# Patient Record
Sex: Female | Born: 1985 | Race: White | Hispanic: No | Smoking: Never smoker
Health system: Southern US, Community
[De-identification: ages and names within clinical notes are randomized; demographics above are authoritative.]

## PROBLEM LIST (undated history)

## (undated) DIAGNOSIS — N12 Tubulo-interstitial nephritis, not specified as acute or chronic: Secondary | ICD-10-CM

## (undated) DIAGNOSIS — K2970 Gastritis, unspecified, without bleeding: Secondary | ICD-10-CM

## (undated) DIAGNOSIS — N135 Crossing vessel and stricture of ureter without hydronephrosis: Secondary | ICD-10-CM

## (undated) DIAGNOSIS — B9681 Helicobacter pylori [H. pylori] as the cause of diseases classified elsewhere: Secondary | ICD-10-CM

## (undated) DIAGNOSIS — J811 Chronic pulmonary edema: Secondary | ICD-10-CM

## (undated) DIAGNOSIS — K219 Gastro-esophageal reflux disease without esophagitis: Secondary | ICD-10-CM

## (undated) DIAGNOSIS — N289 Disorder of kidney and ureter, unspecified: Secondary | ICD-10-CM

## (undated) DIAGNOSIS — K3184 Gastroparesis: Secondary | ICD-10-CM

## (undated) DIAGNOSIS — G43909 Migraine, unspecified, not intractable, without status migrainosus: Secondary | ICD-10-CM

## (undated) DIAGNOSIS — I10 Essential (primary) hypertension: Secondary | ICD-10-CM

## (undated) DIAGNOSIS — K760 Fatty (change of) liver, not elsewhere classified: Secondary | ICD-10-CM

## (undated) DIAGNOSIS — Z6841 Body Mass Index (BMI) 40.0 and over, adult: Secondary | ICD-10-CM

## (undated) DIAGNOSIS — A048 Other specified bacterial intestinal infections: Secondary | ICD-10-CM

## (undated) DIAGNOSIS — A4151 Sepsis due to Escherichia coli [E. coli]: Secondary | ICD-10-CM

## (undated) HISTORY — DX: Migraine, unspecified, not intractable, without status migrainosus: G43.909

## (undated) HISTORY — DX: Tubulo-interstitial nephritis, not specified as acute or chronic: N12

## (undated) HISTORY — DX: Sepsis due to Escherichia coli (e. coli): A41.51

## (undated) HISTORY — DX: Crossing vessel and stricture of ureter without hydronephrosis: N13.5

## (undated) HISTORY — DX: Helicobacter pylori (H. pylori) as the cause of diseases classified elsewhere: B96.81

## (undated) HISTORY — DX: Gastroparesis: K31.84

## (undated) HISTORY — DX: Fatty (change of) liver, not elsewhere classified: K76.0

## (undated) HISTORY — DX: Gastritis, unspecified, without bleeding: K29.70

## (undated) HISTORY — DX: Chronic pulmonary edema: J81.1

## (undated) HISTORY — DX: Gastro-esophageal reflux disease without esophagitis: K21.9

---
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
## 2007-05-13 ENCOUNTER — Encounter: Payer: Self-pay | Admitting: *Deleted

## 2007-06-25 ENCOUNTER — Ambulatory Visit: Payer: Self-pay | Admitting: Family Medicine

## 2007-06-27 ENCOUNTER — Encounter (INDEPENDENT_AMBULATORY_CARE_PROVIDER_SITE_OTHER): Payer: Self-pay | Admitting: Family Medicine

## 2007-07-04 ENCOUNTER — Telehealth (INDEPENDENT_AMBULATORY_CARE_PROVIDER_SITE_OTHER): Payer: Self-pay | Admitting: Family Medicine

## 2007-08-14 ENCOUNTER — Encounter (INDEPENDENT_AMBULATORY_CARE_PROVIDER_SITE_OTHER): Payer: Self-pay | Admitting: *Deleted

## 2007-08-14 ENCOUNTER — Emergency Department (HOSPITAL_COMMUNITY): Admission: EM | Admit: 2007-08-14 | Discharge: 2007-08-14 | Payer: Self-pay | Admitting: Emergency Medicine

## 2007-11-24 ENCOUNTER — Emergency Department (HOSPITAL_COMMUNITY): Admission: EM | Admit: 2007-11-24 | Discharge: 2007-11-25 | Payer: Self-pay | Admitting: Emergency Medicine

## 2007-12-08 ENCOUNTER — Ambulatory Visit: Payer: Self-pay | Admitting: Family Medicine

## 2007-12-08 ENCOUNTER — Encounter (INDEPENDENT_AMBULATORY_CARE_PROVIDER_SITE_OTHER): Payer: Self-pay | Admitting: Family Medicine

## 2007-12-08 DIAGNOSIS — R197 Diarrhea, unspecified: Secondary | ICD-10-CM | POA: Insufficient documentation

## 2007-12-08 LAB — CONVERTED CEMR LAB
AST: 22 units/L (ref 0–37)
Albumin: 4.4 g/dL (ref 3.5–5.2)
CO2: 23 meq/L (ref 19–32)
Calcium: 9.2 mg/dL (ref 8.4–10.5)
Cholesterol: 192 mg/dL (ref 0–200)
Eosinophils Absolute: 0.2 10*3/uL (ref 0.0–0.7)
Eosinophils Relative: 3 % (ref 0–5)
Glucose, Bld: 313 mg/dL — ABNORMAL HIGH (ref 70–99)
HDL: 36 mg/dL — ABNORMAL LOW (ref >39)
Hgb A1c MFr Bld: 11.2 %
MCHC: 33.3 g/dL (ref 30.0–36.0)
Neutro Abs: 5.4 10*3/uL (ref 1.7–7.7)
Total Bilirubin: 0.6 mg/dL (ref 0.3–1.2)
Total CHOL/HDL Ratio: 5.3
Triglycerides: 355 mg/dL — ABNORMAL HIGH (ref <150)
VLDL: 71 mg/dL — ABNORMAL HIGH (ref 0–40)

## 2007-12-09 ENCOUNTER — Telehealth (INDEPENDENT_AMBULATORY_CARE_PROVIDER_SITE_OTHER): Payer: Self-pay | Admitting: Family Medicine

## 2007-12-09 ENCOUNTER — Encounter (INDEPENDENT_AMBULATORY_CARE_PROVIDER_SITE_OTHER): Payer: Self-pay | Admitting: Family Medicine

## 2007-12-09 LAB — CONVERTED CEMR LAB
Free T4: 1.38 ng/dL (ref 0.89–1.80)
T3, Free: 3.3 pg/mL (ref 2.3–4.2)

## 2007-12-10 ENCOUNTER — Encounter (INDEPENDENT_AMBULATORY_CARE_PROVIDER_SITE_OTHER): Payer: Self-pay | Admitting: Family Medicine

## 2007-12-10 DIAGNOSIS — E079 Disorder of thyroid, unspecified: Secondary | ICD-10-CM | POA: Insufficient documentation

## 2007-12-19 ENCOUNTER — Encounter: Payer: Self-pay | Admitting: *Deleted

## 2007-12-30 ENCOUNTER — Encounter (INDEPENDENT_AMBULATORY_CARE_PROVIDER_SITE_OTHER): Payer: Self-pay | Admitting: Family Medicine

## 2008-02-08 ENCOUNTER — Emergency Department (HOSPITAL_COMMUNITY): Admission: EM | Admit: 2008-02-08 | Discharge: 2008-02-08 | Payer: Self-pay | Admitting: Family Medicine

## 2008-03-24 ENCOUNTER — Telehealth (INDEPENDENT_AMBULATORY_CARE_PROVIDER_SITE_OTHER): Payer: Self-pay | Admitting: Family Medicine

## 2008-03-29 ENCOUNTER — Encounter (INDEPENDENT_AMBULATORY_CARE_PROVIDER_SITE_OTHER): Payer: Self-pay | Admitting: Internal Medicine

## 2008-03-29 ENCOUNTER — Emergency Department (HOSPITAL_COMMUNITY): Admission: EM | Admit: 2008-03-29 | Discharge: 2008-03-29 | Payer: Self-pay | Admitting: Family Medicine

## 2008-04-07 ENCOUNTER — Ambulatory Visit: Payer: Self-pay | Admitting: Family Medicine

## 2008-04-08 ENCOUNTER — Telehealth (INDEPENDENT_AMBULATORY_CARE_PROVIDER_SITE_OTHER): Payer: Self-pay | Admitting: Family Medicine

## 2008-04-28 ENCOUNTER — Telehealth: Payer: Self-pay | Admitting: Gastroenterology

## 2008-05-07 HISTORY — PX: ESOPHAGOGASTRODUODENOSCOPY: SHX1529

## 2008-05-10 ENCOUNTER — Ambulatory Visit: Payer: Self-pay | Admitting: Family Medicine

## 2008-05-12 ENCOUNTER — Telehealth: Payer: Self-pay | Admitting: *Deleted

## 2008-06-08 ENCOUNTER — Telehealth (INDEPENDENT_AMBULATORY_CARE_PROVIDER_SITE_OTHER): Payer: Self-pay | Admitting: Family Medicine

## 2008-06-15 ENCOUNTER — Encounter: Payer: Self-pay | Admitting: *Deleted

## 2008-06-15 ENCOUNTER — Telehealth (INDEPENDENT_AMBULATORY_CARE_PROVIDER_SITE_OTHER): Payer: Self-pay | Admitting: Family Medicine

## 2008-06-25 ENCOUNTER — Ambulatory Visit: Payer: Self-pay | Admitting: Family Medicine

## 2008-06-25 ENCOUNTER — Encounter (INDEPENDENT_AMBULATORY_CARE_PROVIDER_SITE_OTHER): Payer: Self-pay | Admitting: Family Medicine

## 2008-06-25 ENCOUNTER — Encounter: Admission: RE | Admit: 2008-06-25 | Discharge: 2008-06-25 | Payer: Self-pay | Admitting: Sports Medicine

## 2008-06-27 LAB — CONVERTED CEMR LAB
ALT: 17 units/L (ref 0–35)
AST: 16 units/L (ref 0–37)
Alkaline Phosphatase: 61 units/L (ref 39–117)
Chloride: 97 meq/L (ref 96–112)
Creatinine, Ser: 0.73 mg/dL (ref 0.40–1.20)
Potassium: 4.7 meq/L (ref 3.5–5.3)
Sodium: 134 meq/L — ABNORMAL LOW (ref 135–145)
TSH: 2.432 microintl units/mL (ref 0.350–4.50)
Total Bilirubin: 0.3 mg/dL (ref 0.3–1.2)
Total Protein: 7.5 g/dL (ref 6.0–8.3)

## 2008-07-01 ENCOUNTER — Encounter: Payer: Self-pay | Admitting: *Deleted

## 2008-07-01 ENCOUNTER — Telehealth: Payer: Self-pay | Admitting: *Deleted

## 2008-07-23 ENCOUNTER — Ambulatory Visit: Payer: Self-pay | Admitting: Family Medicine

## 2008-07-26 ENCOUNTER — Ambulatory Visit: Payer: Self-pay | Admitting: Gastroenterology

## 2008-07-27 ENCOUNTER — Ambulatory Visit: Payer: Self-pay | Admitting: Gastroenterology

## 2008-08-12 ENCOUNTER — Ambulatory Visit: Payer: Self-pay | Admitting: Gastroenterology

## 2008-08-16 ENCOUNTER — Emergency Department (HOSPITAL_COMMUNITY): Admission: EM | Admit: 2008-08-16 | Discharge: 2008-08-16 | Payer: Self-pay | Admitting: Emergency Medicine

## 2008-08-24 ENCOUNTER — Other Ambulatory Visit: Admission: RE | Admit: 2008-08-24 | Discharge: 2008-08-24 | Payer: Self-pay | Admitting: Family Medicine

## 2008-08-24 ENCOUNTER — Encounter (INDEPENDENT_AMBULATORY_CARE_PROVIDER_SITE_OTHER): Payer: Self-pay | Admitting: Family Medicine

## 2008-08-24 ENCOUNTER — Ambulatory Visit: Payer: Self-pay | Admitting: Family Medicine

## 2008-08-24 DIAGNOSIS — N926 Irregular menstruation, unspecified: Secondary | ICD-10-CM | POA: Insufficient documentation

## 2008-08-24 LAB — CONVERTED CEMR LAB: GC Probe Amp, Genital: NEGATIVE

## 2008-08-26 ENCOUNTER — Ambulatory Visit: Payer: Self-pay | Admitting: Family Medicine

## 2008-08-30 ENCOUNTER — Ambulatory Visit (HOSPITAL_COMMUNITY): Admission: RE | Admit: 2008-08-30 | Discharge: 2008-08-30 | Payer: Self-pay | Admitting: Gastroenterology

## 2008-09-09 ENCOUNTER — Ambulatory Visit: Payer: Self-pay | Admitting: Family Medicine

## 2008-09-21 ENCOUNTER — Telehealth: Payer: Self-pay | Admitting: *Deleted

## 2008-09-24 ENCOUNTER — Encounter (INDEPENDENT_AMBULATORY_CARE_PROVIDER_SITE_OTHER): Payer: Self-pay | Admitting: Family Medicine

## 2008-09-24 ENCOUNTER — Ambulatory Visit: Payer: Self-pay | Admitting: Family Medicine

## 2008-09-24 LAB — CONVERTED CEMR LAB
FSH: 5.7 milliintl units/mL
LH: 5.2 milliintl units/mL
Platelets: 286 10*3/uL (ref 150–400)
RBC: 4.97 M/uL (ref 3.87–5.11)
RDW: 13.4 % (ref 11.5–15.5)

## 2008-09-27 ENCOUNTER — Encounter (INDEPENDENT_AMBULATORY_CARE_PROVIDER_SITE_OTHER): Payer: Self-pay | Admitting: Family Medicine

## 2008-09-27 ENCOUNTER — Ambulatory Visit: Payer: Self-pay | Admitting: Gastroenterology

## 2008-09-27 DIAGNOSIS — K3184 Gastroparesis: Secondary | ICD-10-CM | POA: Insufficient documentation

## 2008-09-27 LAB — CONVERTED CEMR LAB: TSH: 4.828 microintl units/mL — ABNORMAL HIGH (ref 0.350–4.500)

## 2008-09-28 ENCOUNTER — Encounter (INDEPENDENT_AMBULATORY_CARE_PROVIDER_SITE_OTHER): Payer: Self-pay | Admitting: Family Medicine

## 2008-09-28 ENCOUNTER — Encounter (INDEPENDENT_AMBULATORY_CARE_PROVIDER_SITE_OTHER): Payer: Self-pay | Admitting: *Deleted

## 2008-09-28 LAB — CONVERTED CEMR LAB: Free T4: 1.22 ng/dL (ref 0.80–1.80)

## 2008-10-11 ENCOUNTER — Ambulatory Visit: Payer: Self-pay | Admitting: Internal Medicine

## 2008-10-28 ENCOUNTER — Ambulatory Visit: Payer: Self-pay | Admitting: Gastroenterology

## 2008-11-01 ENCOUNTER — Ambulatory Visit: Payer: Self-pay | Admitting: Family Medicine

## 2008-11-01 DIAGNOSIS — I10 Essential (primary) hypertension: Secondary | ICD-10-CM | POA: Insufficient documentation

## 2008-11-01 HISTORY — DX: Essential (primary) hypertension: I10

## 2008-11-01 LAB — CONVERTED CEMR LAB: Hgb A1c MFr Bld: 11.6 %

## 2008-11-04 ENCOUNTER — Encounter (INDEPENDENT_AMBULATORY_CARE_PROVIDER_SITE_OTHER): Payer: Self-pay | Admitting: *Deleted

## 2008-11-26 ENCOUNTER — Encounter: Payer: Self-pay | Admitting: Family Medicine

## 2008-12-17 ENCOUNTER — Ambulatory Visit: Payer: Self-pay | Admitting: Gastroenterology

## 2009-08-24 ENCOUNTER — Telehealth (INDEPENDENT_AMBULATORY_CARE_PROVIDER_SITE_OTHER): Payer: Self-pay | Admitting: *Deleted

## 2009-10-12 ENCOUNTER — Telehealth (INDEPENDENT_AMBULATORY_CARE_PROVIDER_SITE_OTHER): Payer: Self-pay | Admitting: *Deleted

## 2010-03-06 ENCOUNTER — Emergency Department (HOSPITAL_COMMUNITY): Admission: EM | Admit: 2010-03-06 | Discharge: 2010-03-06 | Payer: Self-pay | Admitting: Family Medicine

## 2010-06-06 NOTE — Progress Notes (Signed)
Summary: tb results   Phone Note Call from Patient Call back at Home Phone [phone]   Caller: Patient Summary of Call: pt needs a copy of tb test from last year Initial call taken by: Audie Clear,  October 12, 2009 3:17 PM  Follow-up for Phone Call        patient notified that record is ready for pick up. Follow-up by: Marcell Barlow RN,  October 12, 2009 3:28 PM

## 2010-06-06 NOTE — Progress Notes (Signed)
Summary: shot records   Phone Note Call from Patient Call back at Home Phone [phone]   Caller: Patient Summary of Call: needs a copy of shot records Initial call taken by: Audie Clear,  August 24, 2009 11:04 AM  Follow-up for Phone Call        called and left message to return call. We do not have a complete list of immunizations.  will place shot record that we have in file in front office. Follow-up by: Marcell Barlow RN,  August 24, 2009 11:21 AM  Additional Follow-up for Phone Call Additional follow up Details #1::        spoke with patient and she states she has been coming to this office since she was a baby. will request old chart from storage and this will take about a week. will call patient when we receive.     Appended Document: shot records patient notified that record is ready for pick up  and will be a $10.00 fee.

## 2010-07-19 LAB — POCT URINALYSIS DIPSTICK
Glucose, UA: NEGATIVE mg/dL
Nitrite: NEGATIVE
pH: 7 (ref 5.0–8.0)

## 2010-08-16 LAB — URINALYSIS, ROUTINE W REFLEX MICROSCOPIC
Bilirubin Urine: NEGATIVE
Protein, ur: 100 mg/dL — AB
pH: 5.5 (ref 5.0–8.0)

## 2010-08-16 LAB — URINE MICROSCOPIC-ADD ON

## 2010-08-16 LAB — POCT PREGNANCY, URINE: Preg Test, Ur: NEGATIVE

## 2010-08-17 LAB — GLUCOSE, CAPILLARY
Glucose-Capillary: 199 mg/dL — ABNORMAL HIGH (ref 70–99)
Glucose-Capillary: 242 mg/dL — ABNORMAL HIGH (ref 70–99)

## 2010-08-21 LAB — GLUCOSE, CAPILLARY: Glucose-Capillary: 323 mg/dL — ABNORMAL HIGH (ref 70–99)

## 2010-08-25 ENCOUNTER — Inpatient Hospital Stay (INDEPENDENT_AMBULATORY_CARE_PROVIDER_SITE_OTHER)
Admission: RE | Admit: 2010-08-25 | Discharge: 2010-08-25 | Disposition: A | Discharge status: Home / Self Care | Payer: 59 | Source: Ambulatory Visit | Attending: Family Medicine | Admitting: Family Medicine

## 2010-08-25 ENCOUNTER — Emergency Department (HOSPITAL_COMMUNITY): Payer: 59

## 2010-08-25 ENCOUNTER — Emergency Department (HOSPITAL_COMMUNITY)
Admission: EM | Admit: 2010-08-25 | Discharge: 2010-08-25 | Disposition: A | Discharge status: Home / Self Care | Payer: 59 | Attending: Emergency Medicine | Admitting: Emergency Medicine

## 2010-08-25 DIAGNOSIS — R7989 Other specified abnormal findings of blood chemistry: Secondary | ICD-10-CM

## 2010-08-25 DIAGNOSIS — N12 Tubulo-interstitial nephritis, not specified as acute or chronic: Secondary | ICD-10-CM

## 2010-08-25 DIAGNOSIS — R000 Tachycardia, unspecified: Secondary | ICD-10-CM | POA: Insufficient documentation

## 2010-08-25 DIAGNOSIS — O2390 Unspecified genitourinary tract infection in pregnancy, unspecified trimester: Secondary | ICD-10-CM | POA: Insufficient documentation

## 2010-08-25 DIAGNOSIS — Z331 Pregnant state, incidental: Secondary | ICD-10-CM

## 2010-08-25 DIAGNOSIS — E119 Type 2 diabetes mellitus without complications: Secondary | ICD-10-CM | POA: Insufficient documentation

## 2010-08-25 DIAGNOSIS — R109 Unspecified abdominal pain: Secondary | ICD-10-CM | POA: Insufficient documentation

## 2010-08-25 DIAGNOSIS — R110 Nausea: Secondary | ICD-10-CM | POA: Insufficient documentation

## 2010-08-25 DIAGNOSIS — R300 Dysuria: Secondary | ICD-10-CM | POA: Insufficient documentation

## 2010-08-25 DIAGNOSIS — R509 Fever, unspecified: Secondary | ICD-10-CM | POA: Insufficient documentation

## 2010-08-25 DIAGNOSIS — R350 Frequency of micturition: Secondary | ICD-10-CM | POA: Insufficient documentation

## 2010-08-25 LAB — POCT I-STAT, CHEM 8
BUN: 6 mg/dL (ref 6–23)
Calcium, Ion: 1.09 mmol/L — ABNORMAL LOW (ref 1.12–1.32)
Creatinine, Ser: 0.8 mg/dL (ref 0.4–1.2)
Glucose, Bld: 354 mg/dL — ABNORMAL HIGH (ref 70–99)
Sodium: 131 mEq/L — ABNORMAL LOW (ref 135–145)

## 2010-08-25 LAB — CBC
Hemoglobin: 14.5 g/dL (ref 12.0–15.0)
MCH: 29.7 pg (ref 26.0–34.0)
MCHC: 35.4 g/dL (ref 30.0–36.0)

## 2010-08-25 LAB — POCT URINALYSIS DIP (DEVICE)
Bilirubin Urine: NEGATIVE
Glucose, UA: 500 mg/dL — AB
Protein, ur: 100 mg/dL — AB

## 2010-08-25 LAB — DIFFERENTIAL
Basophils Relative: 0 % (ref 0–1)
Monocytes Absolute: 1.7 10*3/uL — ABNORMAL HIGH (ref 0.1–1.0)
Monocytes Relative: 9 % (ref 3–12)
Neutro Abs: 15.7 10*3/uL — ABNORMAL HIGH (ref 1.7–7.7)

## 2010-08-25 LAB — POCT PREGNANCY, URINE: Preg Test, Ur: POSITIVE

## 2010-08-25 LAB — HCG, QUANTITATIVE, PREGNANCY: hCG, Beta Chain, Quant, S: 151 m[IU]/mL — ABNORMAL HIGH (ref <5)

## 2010-08-28 ENCOUNTER — Inpatient Hospital Stay (HOSPITAL_COMMUNITY)
Admission: AD | Admit: 2010-08-28 | Discharge: 2010-08-28 | Disposition: A | Discharge status: Home / Self Care | Payer: 59 | Source: Ambulatory Visit | Attending: Family Medicine | Admitting: Family Medicine

## 2010-08-28 DIAGNOSIS — O200 Threatened abortion: Secondary | ICD-10-CM | POA: Insufficient documentation

## 2010-08-30 ENCOUNTER — Other Ambulatory Visit: Payer: Self-pay | Admitting: Obstetrics and Gynecology

## 2010-08-30 DIAGNOSIS — O99810 Abnormal glucose complicating pregnancy: Secondary | ICD-10-CM

## 2010-08-30 DIAGNOSIS — Z331 Pregnant state, incidental: Secondary | ICD-10-CM

## 2010-08-30 LAB — POCT URINALYSIS DIP (DEVICE)
Bilirubin Urine: NEGATIVE
Ketones, ur: NEGATIVE mg/dL
Specific Gravity, Urine: 1.025 (ref 1.005–1.030)
pH: 5.5 (ref 5.0–8.0)

## 2010-09-05 ENCOUNTER — Inpatient Hospital Stay (HOSPITAL_COMMUNITY)
Admission: AD | Admit: 2010-09-05 | Discharge: 2010-09-05 | Disposition: A | Discharge status: Home / Self Care | Payer: 59 | Source: Ambulatory Visit | Attending: Family Medicine | Admitting: Family Medicine

## 2010-09-05 DIAGNOSIS — O200 Threatened abortion: Secondary | ICD-10-CM | POA: Insufficient documentation

## 2010-09-05 LAB — CBC
HCT: 36.6 % (ref 36.0–46.0)
Hemoglobin: 12.7 g/dL (ref 12.0–15.0)
MCH: 29.3 pg (ref 26.0–34.0)
MCHC: 34.7 g/dL (ref 30.0–36.0)

## 2010-09-05 LAB — DIFFERENTIAL
Lymphocytes Relative: 32 % (ref 12–46)
Lymphs Abs: 3.6 10*3/uL (ref 0.7–4.0)
Monocytes Absolute: 0.9 10*3/uL (ref 0.1–1.0)
Monocytes Relative: 8 % (ref 3–12)
Neutro Abs: 6.5 10*3/uL (ref 1.7–7.7)

## 2010-09-05 LAB — CREATININE, SERUM: GFR calc non Af Amer: >60 mL/min (ref >60)

## 2010-09-05 LAB — HCG, QUANTITATIVE, PREGNANCY: hCG, Beta Chain, Quant, S: 261 m[IU]/mL — ABNORMAL HIGH (ref <5)

## 2010-09-06 ENCOUNTER — Inpatient Hospital Stay (HOSPITAL_COMMUNITY)
Admission: AD | Admit: 2010-09-06 | Discharge: 2010-09-06 | Disposition: A | Discharge status: Home / Self Care | Payer: 59 | Source: Ambulatory Visit | Attending: Obstetrics and Gynecology | Admitting: Obstetrics and Gynecology

## 2010-09-06 DIAGNOSIS — O00109 Unspecified tubal pregnancy without intrauterine pregnancy: Secondary | ICD-10-CM | POA: Insufficient documentation

## 2010-09-09 ENCOUNTER — Inpatient Hospital Stay (HOSPITAL_COMMUNITY)
Admission: AD | Admit: 2010-09-09 | Discharge: 2010-09-09 | Disposition: A | Discharge status: Home / Self Care | Payer: 59 | Source: Ambulatory Visit | Attending: Obstetrics & Gynecology | Admitting: Obstetrics & Gynecology

## 2010-09-09 DIAGNOSIS — O00109 Unspecified tubal pregnancy without intrauterine pregnancy: Secondary | ICD-10-CM

## 2010-09-12 ENCOUNTER — Inpatient Hospital Stay (HOSPITAL_COMMUNITY)
Admission: AD | Admit: 2010-09-12 | Discharge: 2010-09-12 | Disposition: A | Discharge status: Home / Self Care | Payer: 59 | Source: Ambulatory Visit | Attending: Obstetrics & Gynecology | Admitting: Obstetrics & Gynecology

## 2010-09-12 DIAGNOSIS — O00109 Unspecified tubal pregnancy without intrauterine pregnancy: Secondary | ICD-10-CM

## 2010-09-12 LAB — HCG, QUANTITATIVE, PREGNANCY: hCG, Beta Chain, Quant, S: 215 m[IU]/mL — ABNORMAL HIGH (ref <5)

## 2010-09-19 ENCOUNTER — Other Ambulatory Visit: Payer: 59

## 2010-09-19 DIAGNOSIS — Z0189 Encounter for other specified special examinations: Secondary | ICD-10-CM

## 2010-09-20 ENCOUNTER — Other Ambulatory Visit: Payer: 59

## 2010-09-26 ENCOUNTER — Other Ambulatory Visit: Payer: 59

## 2010-09-26 DIAGNOSIS — Z0189 Encounter for other specified special examinations: Secondary | ICD-10-CM

## 2010-09-29 ENCOUNTER — Other Ambulatory Visit: Payer: 59

## 2010-09-29 DIAGNOSIS — Z0189 Encounter for other specified special examinations: Secondary | ICD-10-CM

## 2010-10-06 ENCOUNTER — Other Ambulatory Visit: Payer: 59

## 2010-10-06 DIAGNOSIS — Z0189 Encounter for other specified special examinations: Secondary | ICD-10-CM

## 2010-10-11 ENCOUNTER — Other Ambulatory Visit: Payer: 59

## 2010-10-12 ENCOUNTER — Other Ambulatory Visit: Payer: 59

## 2010-10-12 DIAGNOSIS — Z0189 Encounter for other specified special examinations: Secondary | ICD-10-CM

## 2010-11-17 ENCOUNTER — Encounter: Payer: Self-pay | Admitting: Obstetrics & Gynecology

## 2010-11-17 ENCOUNTER — Ambulatory Visit (INDEPENDENT_AMBULATORY_CARE_PROVIDER_SITE_OTHER): Payer: 59 | Admitting: Obstetrics & Gynecology

## 2010-11-17 ENCOUNTER — Ambulatory Visit: Payer: 59 | Admitting: Obstetrics & Gynecology

## 2010-11-17 VITALS — BP 121/86 | HR 97 | Temp 97.2°F | Ht 62.0 in | Wt 271.6 lb

## 2010-11-17 DIAGNOSIS — Z8742 Personal history of other diseases of the female genital tract: Secondary | ICD-10-CM

## 2010-11-17 DIAGNOSIS — Z8759 Personal history of other complications of pregnancy, childbirth and the puerperium: Secondary | ICD-10-CM

## 2010-11-17 MED ORDER — NORGESTIM-ETH ESTRAD TRIPHASIC 0.18/0.215/0.25 MG-25 MCG PO TABS: 1.0000 | ORAL_TABLET | ORAL | Status: DC

## 2010-11-17 NOTE — Patient Instructions (Signed)
Oral Contraceptives (Birth Control Pills) Oral contraceptives (OCs) are medicines taken to prevent pregnancy. They are the most widely used method of birth control. OCs work by preventing the ovaries from releasing eggs. The OC hormones also cause the mucus on the cervix to thicken, preventing the sperm from entering the uterus. They also cause the lining of the uterus to become thin, not allowing a fertilized egg to attach to the inside of the uterus. OCs have a failure rate of less than 1%, when taken exactly as prescribed. THERE ARE 2 TYPES OF OC  OC that contains a mix of estrogen and progesterone hormones is the most common OC used. It is taken for 21 days, followed by 7 days of not taking the OC hormones. It can be packaged as 28 pills, with the last 7 pills being inactive. You take a pill every day. This way you do not need to remember when to restart taking the active pills. Most women will begin their menstrual period 2 to 3 days after taking the hormone pill. The menstrual period is usually lighter and shorter. This combination OC should not be taken if you are breast-feeding.   The progesterone only (minipill) OC does not contain estrogen. It is taken every day, continuously. You may have only spotting for a period, or no period at all. The progesterone only OC can be taken if you are breast-feeding your baby.  OCs come in:  Packs of 21 pills, with no pills to take for 7 days after the last pill.   Packs of 28 pills, with a pill to take every day. The last 7 pills are without hormones.   Packs of 91 pills (continuous or extended use), with a pill to take every day. The first 84 pills contain the hormones, and the last 7 pills do not. That is when you will have your menstrual period. You will not have a menstrual period during the time you are taking the first 84 pills.  HOW TO TAKE OC Your caregiver may advise you on how to start taking the first cycle of OCs. Otherwise, you can:  Start  on day 1 or day 5 of your menstrual period, taking the first pack of the OC. You will not need any backup contraceptive protection with this start time.   Start on the first Sunday after your menstrual period, day 7 of your menstrual period, or the day you get your prescription. In these cases, you will need backup contraceptive protection for the first cycle.  No matter which day you start the OC, you will always start a new pack on that same day of the week. It is a good idea to have an extra pack of OCs and a backup contraceptive method available, in case you miss some pills or lose your OC pack. COMMON REASONS FOR FAILURE   Forgetting to take the pill at the same time every day.   Poor absorption of the pill from the stomach into the bloodstream. This can be caused by diarrhea, vomiting, and the use of some medicines that kill germs (antibiotics).   Stomach or intestinal disease.   Taking OCs with other medicines that may make them less effective (carbamazepine, phenytoin, phenobarbital, rifampin).   Using OCs that have passed their expiration dates.   Forgetting to restart the pills on day 7, when using the packs of 21 pills.  If you forget to take 1 pill, take it as soon as you remember, and take the next  pill at the regular time. If you miss 2 or more pills, use backup birth control until your next menstrual period starts. Also, you may have vaginal spotting or bleeding when you miss 2 or more OC pills. If you use the pack of 28 pills or 91 pills, and you miss 1 of the last 7 pills (pills with no hormones), it will not matter. Just throw away the rest of the non-hormone pills and start a new 28 or 91 pill pack. COMMON USES OF OC  Decreasing premenstrual problems (symptoms).  Treating menstrual period cramps.   Avoiding becoming pregnant.   Regulating the menstrual cycle.   Treating acne.   Decreasing the heavy menstrual flow.   Treating dysfunctional (abnormal) uterine bleeding.    Treating chronic pelvic pain.   Treating polycystic ovary syndrome (ovary does not ovulate and produces tiny cysts).   Treating endometriosis (uterus lining growing in the pelvis, tubes, and ovaries).   Can be used for emergency contraception.   OCs DO NOT prevent sexually transmitted diseases (STDs). Safer sex practices, such as using condoms along with the pill, can help prevent STDs.  BENEFITS  OC reduces the risk of:   Cancer of the ovary and uterus.   Ovarian cysts.   Pelvic infection.   Symptoms of polycystic ovary syndrome.   Loss of bone (osteoporosis).   Noncancerous (benign) breast disease (fibrocystic breast changes).   Lack of red blood cells (anemia) from heavy or long menstrual periods.   Pregnancy occurring outside the uterus (tubal pregnancy).   Acne.   Slows down the flow of heavy menstrual periods.   Sometimes helps control premenstrual syndrome (PMS).   Stops menstrual cramps and pain.   Controls irregular menstrual periods.   Can be used as emergency contraception.  YOU SHOULD NOT TAKE THE PILL IF YOU:  Are pregnant, or are trying to get pregnant.  Have unexplained or abnormal vaginal bleeding.   Have a history of liver disease, stroke, or heart attack.   Smoke.   Have a history of blood clots, cancer, or heart problems.   Have gallbladder disease.   Have breast cancer or suspect breast cancer.   Have or suspect pelvic cancer.   Have high blood pressure.  Have high cholesterol or high triglycerides.   Have mental depression.   Are breast-feeding, except for the progesterone only OC, with approval of your caregiver.   Have diabetes with kidney, eye, or other blood vessel complications. Or if you have diabetes for 20 years or more.   Have heart valve disease.   Have migraine headaches. They may get worse.   Before taking the pill, a woman will have a physical exam and Pap test. Your caregiver may order blood tests to check  blood sugar and cholesterol levels, and other blood tests that may be necessary. SIDE EFFECTS OF THE PILL MAY INCLUDE:  Breast tenderness, pain and discharge.   Change in sex drive (increased or decreased libido).   Depression.   Being tired often.   Headaches.   Anxiety.   Irregular spotting or vaginal bleeding for a couple of months.  Leg pain.   Cramps, or swelling of your limbs (extremities).   Mood swings.   Weight loss or weight gain.   Feeling sick to your stomach (nausea).   Change in appetite (hunger).  Loss of hair.   Yeast or fungus vaginal infection.   Nervousness.   Rash.   Acne.   No menstrual period (amenorrhea).  When starting an OC, it is usually best to allow 2-3 months, if possible, for the body to adjust (before stopping because of side effects). This allows for adjustment to the changes in hormone levels. If a woman continues to have side effects, it may be possible to change to a different OC. It is important to discuss side effects with your caregiver. Often, changing to a different pill causes the side effects to subside. RISKS AND COMPLICATIONS  Blood clots of the leg, heart, lung, or brain.  High blood pressure.   Gallbladder disease.  Liver tumors.   Brain bleeding (hemorrhage).  Slight risk of breast cancer.   HOME CARE INSTRUCTIONS  Do not smoke.   Only take over-the-counter or prescription medicines for pain, discomfort, fever, or breast tenderness as directed by your caregiver.   Always use a condom to protect against sexually transmitted disease. OCs do not protect against STDs.   Keep a calendar, marking your menstrual period days.  Recommendations, types, and dosages of OC use change continually. Discuss your choices with your caregiver, and decide what is best for you. There are always exceptions to guidelines. You should always read the information that comes with the OC, and check whether there are any new  recommendations or guidelines. SEEK MEDICAL CARE IF:  You develop nausea and vomiting from the OC.   You have abnormal vaginal discharge.   You need treatment for headaches.   You develop a rash.   You miss your menstrual period.   You develop abnormal vaginal bleeding.   You are losing your hair.   You need treatment for mood swings or depression.   You get dizzy when taking the OC.   You develop acne from taking the OC.  SEEK IMMEDIATE MEDICAL CARE IF:  You develop leg pain.   You develop chest pain.   You develop shortness of breath.   You develop abdominal pain.   You have an uncontrolled headache.   You develop numbness or slurred speech.   You develop visual problems (loss of vision, double or blurry vision).   You develop heavy vaginal bleeding.  If you are taking the pill, STOP RIGHT AWAY and CALL YOUR CAREGIVER IMMEDIATELY if the following occur:  You develop chest pain and shortness of breath.   You develop pain, redness, and swelling in the legs.   You develop severe headaches, visual changes, or belly (abdominal) pain.   You develop severe depression.   You become pregnant.  Document Released: 07/14/2002 Document Re-Released: 05/15/2009 [facility] Patient Information 2011 [place].

## 2010-11-17 NOTE — Progress Notes (Signed)
  Subjective:    Patient ID: Sheila Austin, female    DOB: Mar 04, 1986, 25 y.o.   MRN: [id number]  HPI 25 yo female G1 P0010 LMP 10/22/10, s/p ectopic pregnancy treated 5/2 with methotrexate. BHCG on 6/8 was less than 2, was as high as 327. No complaint, wants OCP, has used before.    Review of SystemsNegative, no problems     Objective:   Physical Exam    Vitals reviewed NAD, nl affect Pelvic deferred    Assessment & Plan:  Doing well after ectopic Needs contraception. Will prescribe TriSprintec. Return in 3 mo.

## 2011-01-30 LAB — CBC
MCV: 85.6
Platelets: 256
RDW: 12.4
WBC: 13.1 — ABNORMAL HIGH

## 2011-01-30 LAB — URINALYSIS, ROUTINE W REFLEX MICROSCOPIC
Glucose, UA: >1000 — AB
Nitrite: NEGATIVE
Protein, ur: 30 — AB

## 2011-01-30 LAB — DIFFERENTIAL
Basophils Absolute: 0.1
Eosinophils Absolute: 0.3
Lymphocytes Relative: 17
Lymphs Abs: 2.2
Neutrophils Relative %: 73

## 2011-01-30 LAB — POCT I-STAT, CHEM 8
BUN: 15
Creatinine, Ser: 0.8
Potassium: 3.8
Sodium: 136

## 2011-02-02 LAB — URINALYSIS, ROUTINE W REFLEX MICROSCOPIC
Glucose, UA: >1000 — AB
Hgb urine dipstick: NEGATIVE
Specific Gravity, Urine: 1.03
Urobilinogen, UA: 0.2

## 2011-02-02 LAB — URINE MICROSCOPIC-ADD ON

## 2011-02-02 LAB — BASIC METABOLIC PANEL
BUN: 7
Chloride: 99
Creatinine, Ser: 0.83
Glucose, Bld: 259 — ABNORMAL HIGH

## 2011-02-02 LAB — PREGNANCY, URINE: Preg Test, Ur: NEGATIVE

## 2011-02-02 LAB — RAPID STREP SCREEN (MED CTR MEBANE ONLY): Streptococcus, Group A Screen (Direct): NEGATIVE

## 2011-02-06 LAB — POCT I-STAT, CHEM 8
Calcium, Ion: 1.24 mmol/L (ref 1.12–1.32)
Creatinine, Ser: 0.7 mg/dL (ref 0.4–1.2)
Hemoglobin: 14.3 g/dL (ref 12.0–15.0)
Sodium: 138 mEq/L (ref 135–145)
TCO2: 28 mmol/L (ref 0–100)

## 2011-02-19 LAB — DIFFERENTIAL
Basophils Relative: 1
Eosinophils Absolute: 0.2
Lymphs Abs: 3.2
Monocytes Absolute: 0.9
Monocytes Relative: 8

## 2011-02-19 LAB — URINALYSIS, ROUTINE W REFLEX MICROSCOPIC
Glucose, UA: 100 — AB
Hgb urine dipstick: NEGATIVE
Protein, ur: 30 — AB

## 2011-02-19 LAB — CBC
HCT: 43.1
Hemoglobin: 15.1
MCHC: 34.9
MCV: 83.5
RBC: 5.16
WBC: 11.6 — ABNORMAL HIGH

## 2011-02-19 LAB — BASIC METABOLIC PANEL
CO2: 25
Chloride: 103
GFR calc Af Amer: >60
Potassium: 3.9
Sodium: 141

## 2011-02-19 LAB — HEPATIC FUNCTION PANEL
AST: 40 — ABNORMAL HIGH
Alkaline Phosphatase: 65
Bilirubin, Direct: <0.1
Total Bilirubin: 0.7

## 2011-02-19 LAB — URINE MICROSCOPIC-ADD ON

## 2011-02-19 LAB — LIPASE, BLOOD: Lipase: 20

## 2011-02-22 ENCOUNTER — Inpatient Hospital Stay (INDEPENDENT_AMBULATORY_CARE_PROVIDER_SITE_OTHER)
Admission: RE | Admit: 2011-02-22 | Discharge: 2011-02-22 | Disposition: A | Discharge status: Home / Self Care | Payer: 59 | Source: Ambulatory Visit | Attending: Family Medicine | Admitting: Family Medicine

## 2011-02-22 DIAGNOSIS — T7840XA Allergy, unspecified, initial encounter: Secondary | ICD-10-CM

## 2011-03-05 ENCOUNTER — Inpatient Hospital Stay (INDEPENDENT_AMBULATORY_CARE_PROVIDER_SITE_OTHER)
Admission: RE | Admit: 2011-03-05 | Discharge: 2011-03-05 | Disposition: A | Discharge status: Home / Self Care | Payer: 59 | Source: Ambulatory Visit | Attending: Family Medicine | Admitting: Family Medicine

## 2011-03-05 DIAGNOSIS — N898 Other specified noninflammatory disorders of vagina: Secondary | ICD-10-CM

## 2011-03-05 LAB — POCT URINALYSIS DIP (DEVICE)
Glucose, UA: 500 mg/dL — AB
Nitrite: NEGATIVE
Protein, ur: NEGATIVE mg/dL
Specific Gravity, Urine: 1.02 (ref 1.005–1.030)
Urobilinogen, UA: 0.2 mg/dL (ref 0.0–1.0)

## 2011-03-05 LAB — WET PREP, GENITAL: Yeast Wet Prep HPF POC: NONE SEEN

## 2011-03-06 LAB — GC/CHLAMYDIA PROBE AMP, GENITAL: GC Probe Amp, Genital: NEGATIVE

## 2011-04-13 ENCOUNTER — Encounter: Payer: 59 | Admitting: Family Medicine

## 2011-05-14 ENCOUNTER — Encounter: Payer: 59 | Admitting: Family Medicine

## 2011-09-07 ENCOUNTER — Ambulatory Visit (INDEPENDENT_AMBULATORY_CARE_PROVIDER_SITE_OTHER): Payer: 59 | Admitting: Family Medicine

## 2011-09-07 ENCOUNTER — Encounter: Payer: Self-pay | Admitting: Family Medicine

## 2011-09-07 VITALS — BP 122/98 | HR 136 | Temp 98.5°F | Ht 62.0 in | Wt 279.0 lb

## 2011-09-07 DIAGNOSIS — R110 Nausea: Secondary | ICD-10-CM

## 2011-09-07 DIAGNOSIS — K3184 Gastroparesis: Secondary | ICD-10-CM

## 2011-09-07 DIAGNOSIS — I10 Essential (primary) hypertension: Secondary | ICD-10-CM

## 2011-09-07 DIAGNOSIS — E119 Type 2 diabetes mellitus without complications: Secondary | ICD-10-CM

## 2011-09-07 DIAGNOSIS — B9681 Helicobacter pylori [H. pylori] as the cause of diseases classified elsewhere: Secondary | ICD-10-CM

## 2011-09-07 DIAGNOSIS — K2970 Gastritis, unspecified, without bleeding: Secondary | ICD-10-CM

## 2011-09-07 DIAGNOSIS — G43909 Migraine, unspecified, not intractable, without status migrainosus: Secondary | ICD-10-CM | POA: Insufficient documentation

## 2011-09-07 DIAGNOSIS — N926 Irregular menstruation, unspecified: Secondary | ICD-10-CM

## 2011-09-07 DIAGNOSIS — A048 Other specified bacterial intestinal infections: Secondary | ICD-10-CM

## 2011-09-07 HISTORY — DX: Helicobacter pylori (H. pylori) as the cause of diseases classified elsewhere: B96.81

## 2011-09-07 LAB — POCT URINE PREGNANCY: Preg Test, Ur: NEGATIVE

## 2011-09-07 LAB — POCT GLYCOSYLATED HEMOGLOBIN (HGB A1C): Hemoglobin A1C: 13.6

## 2011-09-07 LAB — POCT H PYLORI SCREEN: H Pylori Screen, POC: POSITIVE

## 2011-09-07 MED ORDER — METFORMIN HCL ER (MOD) 500 MG PO TB24: 500.0000 mg | ORAL_TABLET | Freq: Every day | ORAL | Status: DC

## 2011-09-07 MED ORDER — CLARITHROMYCIN 500 MG PO TABS: 500.0000 mg | ORAL_TABLET | Freq: Two times a day (BID) | ORAL | Status: DC

## 2011-09-07 MED ORDER — AMOXICILLIN 500 MG PO CAPS: 1000.0000 mg | ORAL_CAPSULE | Freq: Two times a day (BID) | ORAL | Status: DC

## 2011-09-07 MED ORDER — NAPROXEN 500 MG PO TABS: 500.0000 mg | ORAL_TABLET | Freq: Two times a day (BID) | ORAL | Status: DC

## 2011-09-07 MED ORDER — ESOMEPRAZOLE MAGNESIUM 40 MG PO PACK: 40.0000 mg | PACK | Freq: Every day | ORAL | Status: DC

## 2011-09-07 NOTE — Assessment & Plan Note (Signed)
May also be a component of her nausea.  Will start by working to lower glucose by starting DM medications. Will continue to monitor for the need for further treatment.

## 2011-09-07 NOTE — Assessment & Plan Note (Signed)
Upreg neg.  Likely due to metabolic syndrome/ possible PCOS.  Advised will need to discuss in further detail at follow-up appointment.

## 2011-09-07 NOTE — Assessment & Plan Note (Signed)
Restart metformin

## 2011-09-07 NOTE — Progress Notes (Signed)
  Subjective:    Patient ID: Sheila Austin, female    DOB: June 18, 1985, 26 y.o.   MRN: [id number]  HPI new patient to establish care  Headaches for several weeks, for the past week and having nausea and dizzy for the past week.  Headaches at least every other day, frontal and right temporal.  "grinding, pounding" Does not take any OTC pain medicines.  Makes her feel dizzy and nauseous.  Lasts for several hours.  Self resolves.  Remarks has photosensitivity and sound sensitivity with these episodes.  Mom, grandfather and aunt. has migraines.  Nausea usually with meals, bowel movements formed but loose.  No headache associated with this.  Episode subsides within 30 minutes after emesis.  No constipation.  Home pregnancy test negative.  DM:  Not taking any medications right now.  Has not had care for diabetes in 3 years.  I have reviewed patient's  PMH, FH, and Social history and Medications as related to this visit. Hx of ectopic pregnancy, history of headaches, gastroparesis. Review of Systems     Objective:   Physical Exam GEN: Alert & Oriented, No acute distress, obese CV:  Regular Rate & Rhythm, no murmur Respiratory:  Normal work of breathing, CTAB Abd:  + BS, soft, no tenderness to palpation Ext: no pre-tibial edema        Assessment & Plan:

## 2011-09-07 NOTE — Assessment & Plan Note (Signed)
Elevated today, will follow-up in 2 weeks, get fasting labwork.

## 2011-09-07 NOTE — Assessment & Plan Note (Signed)
Will treat with triple therapy x 2 weeks (amoxicillin, clarithromycin, nexium)

## 2011-09-07 NOTE — Patient Instructions (Addendum)
Will prescribe antibiotics (amoxicillin and clarithromycin) and acid reducer (nexium) for your H. Pylori.  Take all three of these for the next two weeks  Will restart metformin- after you feel less nauseous.  Start with one tablet daily and move up to two tablets as you feel ready  Will send in prescription for naproxen to help with headache- caution- can cause stomach upset and if you take it several times per week, can cause rebound headaches.  Follow-up in 2 weeks- will get fasting bloodwork then

## 2011-09-07 NOTE — Assessment & Plan Note (Signed)
naproxen

## 2011-09-11 ENCOUNTER — Emergency Department (HOSPITAL_COMMUNITY)
Admission: EM | Admit: 2011-09-11 | Discharge: 2011-09-11 | Disposition: A | Discharge status: Home / Self Care | Payer: 59 | Attending: Emergency Medicine | Admitting: Emergency Medicine

## 2011-09-11 ENCOUNTER — Emergency Department (HOSPITAL_COMMUNITY): Payer: 59

## 2011-09-11 ENCOUNTER — Telehealth: Payer: Self-pay | Admitting: *Deleted

## 2011-09-11 ENCOUNTER — Encounter (HOSPITAL_COMMUNITY): Payer: Self-pay | Admitting: *Deleted

## 2011-09-11 DIAGNOSIS — R0600 Dyspnea, unspecified: Secondary | ICD-10-CM

## 2011-09-11 DIAGNOSIS — E119 Type 2 diabetes mellitus without complications: Secondary | ICD-10-CM | POA: Insufficient documentation

## 2011-09-11 DIAGNOSIS — R0602 Shortness of breath: Secondary | ICD-10-CM | POA: Insufficient documentation

## 2011-09-11 DIAGNOSIS — R0609 Other forms of dyspnea: Secondary | ICD-10-CM | POA: Insufficient documentation

## 2011-09-11 DIAGNOSIS — R0789 Other chest pain: Secondary | ICD-10-CM | POA: Insufficient documentation

## 2011-09-11 DIAGNOSIS — T7840XA Allergy, unspecified, initial encounter: Secondary | ICD-10-CM

## 2011-09-11 DIAGNOSIS — J3489 Other specified disorders of nose and nasal sinuses: Secondary | ICD-10-CM | POA: Insufficient documentation

## 2011-09-11 DIAGNOSIS — T360X5A Adverse effect of penicillins, initial encounter: Secondary | ICD-10-CM | POA: Insufficient documentation

## 2011-09-11 DIAGNOSIS — K219 Gastro-esophageal reflux disease without esophagitis: Secondary | ICD-10-CM | POA: Insufficient documentation

## 2011-09-11 DIAGNOSIS — R0989 Other specified symptoms and signs involving the circulatory and respiratory systems: Secondary | ICD-10-CM | POA: Insufficient documentation

## 2011-09-11 DIAGNOSIS — Z79899 Other long term (current) drug therapy: Secondary | ICD-10-CM | POA: Insufficient documentation

## 2011-09-11 LAB — CBC
HCT: 41.2 % (ref 36.0–46.0)
MCHC: 35.4 g/dL (ref 30.0–36.0)
MCV: 83.2 fL (ref 78.0–100.0)
RDW: 12.7 % (ref 11.5–15.5)

## 2011-09-11 LAB — DIFFERENTIAL
Basophils Absolute: 0 10*3/uL (ref 0.0–0.1)
Basophils Relative: 0 % (ref 0–1)
Eosinophils Relative: 2 % (ref 0–5)
Monocytes Absolute: 0.8 10*3/uL (ref 0.1–1.0)

## 2011-09-11 MED ORDER — DIPHENHYDRAMINE HCL 25 MG PO CAPS
50.0000 mg | ORAL_CAPSULE | Freq: Once | ORAL | Status: AC
  Administered 2011-09-11: 50 mg via ORAL
  Filled 2011-09-11: qty 2

## 2011-09-11 NOTE — ED Notes (Signed)
Pt denies any further questions or pain upon discharge.

## 2011-09-11 NOTE — ED Notes (Signed)
She thinks she has an allergic reaction to amoxicillin since this am.  She feels like she cannot breathe,.  She is hyperventilating no swelling in the back of her throat and her 02 sat is 100%.  She has been taking amoxicillin for 2-3 days.

## 2011-09-11 NOTE — Telephone Encounter (Signed)
Mother called stating she thinks patient is having an allergic reaction to amoxicillin. She has a rash and  starting to have difficulty swallowing.  Feels that her throat is closing.  She does not have any benadryl on hand.  Advised to get her to ED ASAP.

## 2011-09-11 NOTE — ED Notes (Signed)
Family at bedside. 

## 2011-09-11 NOTE — ED Notes (Signed)
Pt states that she has a headache. Pain scale of 5 of 10.

## 2011-09-11 NOTE — ED Notes (Signed)
Patient is resting comfortably. 

## 2011-09-11 NOTE — ED Provider Notes (Signed)
History     CSN: [id number]  Arrival date & time 09/11/11  1540   First MD Initiated Contact with Patient 09/11/11 1822      Chief Complaint  Patient presents with  . Shortness of Breath    (Consider location/radiation/quality/duration/timing/severity/associated sxs/prior treatment) Patient is a 26 y.o. female presenting with allergic reaction. The history is provided by the patient.  Allergic Reaction The primary symptoms are  shortness of breath. The primary symptoms do not include wheezing, nausea, vomiting, diarrhea, rash or urticaria. The current episode started 6 to 12 hours ago. The problem has been gradually improving.  The shortness of breath began today. The shortness of breath developed gradually. The shortness of breath is moderate.  The onset of the reaction was associated with a new medication. Significant symptoms also include rhinorrhea. Significant symptoms that are not present include eye redness, flushing or itching.    Past Medical History  Diagnosis Date  . Diabetes mellitus   . GERD (gastroesophageal reflux disease)     History reviewed. No pertinent past surgical history.  Family History  Problem Relation Age of Onset  . Arthritis Mother   . Asthma Mother   . COPD Mother   . Depression Mother   . Diabetes Mother   . Hypertension Mother   . Mental illness Mother   . Kidney disease Mother   . Crohn's disease Mother   . Mental illness Sister   . Arthritis Maternal Aunt   . Asthma Maternal Aunt   . COPD Maternal Aunt   . Depression Maternal Aunt   . Diabetes Maternal Aunt   . Hypertension Maternal Aunt   . Mental illness Maternal Aunt   . Stroke Maternal Aunt     History  Substance Use Topics  . Smoking status: Never Smoker   . Smokeless tobacco: Never Used  . Alcohol Use: No    OB History    Grav Para Term Preterm Abortions TAB SAB Ect Mult Living   1    1   1   0      Review of Systems  Constitutional: Negative for fever and chills.   HENT: Positive for rhinorrhea.   Eyes: Negative for redness.  Respiratory: Positive for chest tightness and shortness of breath. Negative for wheezing and stridor.   Cardiovascular: Negative for leg swelling.  Gastrointestinal: Negative for nausea, vomiting and diarrhea.  Skin: Negative for flushing, itching and rash.  All other systems reviewed and are negative.    Allergies  Review of patient's allergies indicates no known allergies.  Home Medications   Current Outpatient Rx  Name Route Sig Dispense Refill  . AMOXICILLIN 500 MG PO CAPS Oral Take 1,000 mg by mouth 2 (two) times daily. For 2 weeks    . CLARITHROMYCIN 500 MG PO TABS Oral Take 500 mg by mouth 2 (two) times daily. For 14 days    . ESOMEPRAZOLE MAGNESIUM 40 MG PO PACK Oral Take 40 mg by mouth daily before breakfast.    . METFORMIN HCL ER (MOD) 500 MG PO TB24 Oral Take 500-1,000 mg by mouth daily with breakfast.    . NAPROXEN 500 MG PO TABS Oral Take 500 mg by mouth 2 (two) times daily with a meal. As needed for headache    . NORGESTIM-ETH ESTRAD TRIPHASIC 0.18/0.215/0.25 MG-25 MCG PO TABS Oral Take 1 tablet by mouth daily.      BP 127/61  Pulse 106  Temp(Src) 98.4 F (36.9 C) (Oral)  Resp 23  Ht  5\' 2"  (1.575 m)  Wt 279 lb (126.554 kg)  BMI 51.03 kg/m2  SpO2 99%  LMP 08/19/2011  Physical Exam  Nursing note and vitals reviewed. Constitutional: She is oriented to person, place, and time. She appears well-developed and well-nourished.  HENT:  Head: Normocephalic and atraumatic.  Mouth/Throat: Oropharynx is clear and moist.  Eyes: EOM are normal.  Neck: Normal range of motion.  Cardiovascular: Normal rate, regular rhythm and normal heart sounds.   Pulmonary/Chest: Effort normal and breath sounds normal. No stridor. No respiratory distress. She has no wheezes.  Abdominal: Soft. There is no tenderness.  Musculoskeletal: Normal range of motion.  Neurological: She is alert and oriented to person, place, and time.   Skin: Skin is warm and dry. No rash noted.  Psychiatric: She has a normal mood and affect.    ED Course  Procedures (including critical care time)  Date: 09/11/2011  Rate: 128  Rhythm: sinus tachycardia  QRS Axis: normal  Intervals: normal  ST/T Wave abnormalities: nonspecific T wave changes  Conduction Disutrbances:none  Narrative Interpretation:   Old EKG Reviewed: none available   Labs Reviewed  GLUCOSE, CAPILLARY - Abnormal; Notable for the following:    Glucose-Capillary 255 (*)    All other components within normal limits  CBC  DIFFERENTIAL  POCT PREGNANCY, URINE   Dg Chest 2 View  09/11/2011  *RADIOLOGY REPORT*  Clinical Data: Short of breath.  Congestion.  Severe chest pressure.  CHEST - 2 VIEW  Comparison: None.  Findings:  Cardiopericardial silhouette within normal limits. Mediastinal contours normal. Trachea midline.  No airspace disease or effusion.  IMPRESSION: Negative two-view chest.  Original Report Authenticated By: Dereck Ligas, M.D.     1. Dyspnea   2. Allergic reaction       MDM   Patient presents after self reported allergic reaction. She states that earlier today she had the gradual onset feeling of difficulty catching her breath. She said it felt like someone was sitting on her chest. Subsequently she felt like her throat was closing up. Patient reports this likely related to her anxiety.  She relates this reaction to amoxicillin which she's been taking for approximately 3 days. She's also on new medications of metformin, clarithromycin, Nexium but feels as though those are not the culprit. Since receiving Benadryl in triage she is beginning to feel much better. She denies any rash.  Here the patient has no stigma of allergic reaction. There is no wheeze or stridor. There is no rash appreciated. There is no urticaria. Oropharynx is normal appearing. She has no drooling or facial swelling noted.  Further workup here without other explanation for her  symptoms. Chest x-ray was benign. Labs without anemia. Fingerstick sugar in the 200s similar to what she had been previously. He has only recently been re-started on metformin.  EKG with sinus tachycardia but are improved on my evaluation.  Discussed stopping amoxicillin currently. However I'd am not convinced that this was a true allergic reaction. Thus I will not start the patient on steroids. She can take Benadryl for symptomatic relief.        Helyn App, MD 09/11/11 2049

## 2011-09-12 NOTE — ED Provider Notes (Signed)
I saw and evaluated the patient, reviewed the resident's note and I agree with the findings and plan and agree with their ECG interpretation. Patient with shortness of breath after taking amoxicillin. No known allergy to amoxicillin. Patient does not have a rash. EKG and lab work is reassuring. She'll be discharged home.  Jasper Riling. Alvino Chapel, MD 09/12/11 0040

## 2011-09-20 ENCOUNTER — Telehealth: Payer: Self-pay | Admitting: Family Medicine

## 2011-09-20 NOTE — Telephone Encounter (Signed)
FMLA form placed in Dr. Rayetta Pigg box for completion.  Lauralyn Primes

## 2011-09-20 NOTE — Telephone Encounter (Signed)
Patient dropped off FMLA forms to be filled out.  Please call her when completed. °

## 2011-09-21 NOTE — Telephone Encounter (Signed)
Will discuss with patient at her appointment on tuesday

## 2011-09-24 ENCOUNTER — Ambulatory Visit: Payer: 59 | Admitting: Family Medicine

## 2011-09-24 ENCOUNTER — Other Ambulatory Visit: Payer: 59

## 2011-09-26 ENCOUNTER — Ambulatory Visit (INDEPENDENT_AMBULATORY_CARE_PROVIDER_SITE_OTHER): Payer: 59 | Admitting: Family Medicine

## 2011-09-26 ENCOUNTER — Encounter: Payer: Self-pay | Admitting: Family Medicine

## 2011-09-26 ENCOUNTER — Other Ambulatory Visit: Payer: 59

## 2011-09-26 VITALS — BP 132/90 | HR 98 | Ht 62.0 in | Wt 281.8 lb

## 2011-09-26 DIAGNOSIS — A048 Other specified bacterial intestinal infections: Secondary | ICD-10-CM

## 2011-09-26 DIAGNOSIS — E669 Obesity, unspecified: Secondary | ICD-10-CM

## 2011-09-26 DIAGNOSIS — E119 Type 2 diabetes mellitus without complications: Secondary | ICD-10-CM

## 2011-09-26 DIAGNOSIS — E079 Disorder of thyroid, unspecified: Secondary | ICD-10-CM

## 2011-09-26 DIAGNOSIS — B9681 Helicobacter pylori [H. pylori] as the cause of diseases classified elsewhere: Secondary | ICD-10-CM

## 2011-09-26 DIAGNOSIS — I10 Essential (primary) hypertension: Secondary | ICD-10-CM

## 2011-09-26 DIAGNOSIS — N926 Irregular menstruation, unspecified: Secondary | ICD-10-CM

## 2011-09-26 LAB — COMPREHENSIVE METABOLIC PANEL
ALT: 16 U/L (ref 0–35)
BUN: 11 mg/dL (ref 6–23)
CO2: 25 mEq/L (ref 19–32)
Creat: 0.69 mg/dL (ref 0.50–1.10)
Total Bilirubin: 0.5 mg/dL (ref 0.3–1.2)

## 2011-09-26 LAB — LIPID PANEL
HDL: 33 mg/dL — ABNORMAL LOW (ref >39)
Triglycerides: 508 mg/dL — ABNORMAL HIGH (ref <150)

## 2011-09-26 LAB — TSH: TSH: 3.557 u[IU]/mL (ref 0.350–4.500)

## 2011-09-26 MED ORDER — HYDROCHLOROTHIAZIDE 25 MG PO TABS: 25.0000 mg | ORAL_TABLET | Freq: Every day | ORAL | Status: DC

## 2011-09-26 MED ORDER — METFORMIN HCL ER (MOD) 500 MG PO TB24: 1000.0000 mg | ORAL_TABLET | Freq: Every day | ORAL | Status: DC

## 2011-09-26 MED ORDER — NORGESTIMATE-ETH ESTRADIOL 0.25-35 MG-MCG PO TABS: 1.0000 | ORAL_TABLET | Freq: Every day | ORAL | Status: DC

## 2011-09-26 NOTE — Progress Notes (Signed)
  Subjective:    Patient ID: Sheila Austin, female    DOB: 03/14/1986, 26 y.o.   MRN: [id number]  HPI Here for follow-up  Triple therapy for H. Pylori:  2-3 days after starting triple therapy, has shortness of breath, was evaluated in ER, did not think it was an allergic reaction.  DIABETES- at last visit restarted metformin, had been without medical care for some time.  Taking and tolerating: yes- doing well on 500 mg XR per day. Fasting blood sugars: does not check Hypoglycemic symptoms: no Visual problems: no Monitoring feet: yes Numbness/Tingling: no Last eye exam: Dr. Zenia Resides office, not in the past year Diabetic Labs:  Lab Results  Component Value Date   HGBA1C 13.6 09/07/2011   HGBA1C 11.6 11/01/2008   HGBA1C 10.9 07/23/2008   Lab Results  Component Value Date   George 85 12/08/2007   CREATININE .66 09/05/2010   Last microalbumin: No results found for this basename: MICROALBUR, [id number]   Elevated blood pressure: continues to be elevate dtoday  Requests form to fill out FMLA so she can come to doctors visits.  Review of Systemssee HPI     Objective:   Physical Exam GEN: Alert & Oriented, No acute distress CV:  Regular Rate & Rhythm, no murmur Respiratory:  Normal work of breathing, CTAB Abd:  + BS, soft, no tenderness to palpation Ext: no pre-tibial edema        Assessment & Plan:

## 2011-09-26 NOTE — Assessment & Plan Note (Signed)
Start HCTZ for hypertension. Will avoid ACE-I in diabetes as she is desiring to start a family once she gets her health in better control.

## 2011-09-26 NOTE — Assessment & Plan Note (Signed)
Improved, will continue nexium

## 2011-09-26 NOTE — Patient Instructions (Addendum)
Will refer you to diabetes management Go up to 2 tablets of metformin daily.  Out goal will be 4 tablets daily. Hydrochlorothiazide- for blood pressure Start birth control pills Follow-up in 6-8 weeks

## 2011-09-26 NOTE — Assessment & Plan Note (Signed)
Will continue to titrate up metofmrin.  Will enroll in diabetes education.  Follow-up in 2 months

## 2011-09-26 NOTE — Assessment & Plan Note (Signed)
Patient brought up discussion of weight loss surgery.  We discussed further and decided will first work with DM education, then nutritionist to start with weight loss process and will refer to discuss with surgeon at a later time.  Would likely be a good candidate for less invasive procedure such as lap band.

## 2011-09-26 NOTE — Assessment & Plan Note (Signed)
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  Will check TSH.  Likely due to metabolic syndrome.  Will start OCP, will consider further workup if not improvement.

## 2011-09-26 NOTE — Assessment & Plan Note (Signed)
Will check TSH, Free T4 today

## 2011-09-27 ENCOUNTER — Encounter: Payer: Self-pay | Admitting: Family Medicine

## 2011-09-27 DIAGNOSIS — E781 Pure hyperglyceridemia: Secondary | ICD-10-CM | POA: Insufficient documentation

## 2011-09-27 DIAGNOSIS — E785 Hyperlipidemia, unspecified: Secondary | ICD-10-CM | POA: Insufficient documentation

## 2011-09-27 LAB — MICROALBUMIN / CREATININE URINE RATIO: Creatinine, Urine: 96.9 mg/dL

## 2011-10-01 ENCOUNTER — Encounter (HOSPITAL_COMMUNITY): Payer: Self-pay | Admitting: *Deleted

## 2011-10-01 ENCOUNTER — Inpatient Hospital Stay (HOSPITAL_COMMUNITY)
Admission: EM | Admit: 2011-10-01 | Discharge: 2011-10-06 | DRG: 872 | Disposition: A | Discharge status: Home / Self Care | Payer: 59 | Attending: Family Medicine | Admitting: Family Medicine

## 2011-10-01 DIAGNOSIS — R197 Diarrhea, unspecified: Secondary | ICD-10-CM | POA: Diagnosis not present

## 2011-10-01 DIAGNOSIS — E1165 Type 2 diabetes mellitus with hyperglycemia: Secondary | ICD-10-CM

## 2011-10-01 DIAGNOSIS — N390 Urinary tract infection, site not specified: Secondary | ICD-10-CM | POA: Diagnosis present

## 2011-10-01 DIAGNOSIS — K219 Gastro-esophageal reflux disease without esophagitis: Secondary | ICD-10-CM | POA: Diagnosis present

## 2011-10-01 DIAGNOSIS — J811 Chronic pulmonary edema: Secondary | ICD-10-CM | POA: Diagnosis present

## 2011-10-01 DIAGNOSIS — E872 Acidosis, unspecified: Secondary | ICD-10-CM | POA: Diagnosis present

## 2011-10-01 DIAGNOSIS — E876 Hypokalemia: Secondary | ICD-10-CM | POA: Diagnosis present

## 2011-10-01 DIAGNOSIS — IMO0001 Reserved for inherently not codable concepts without codable children: Secondary | ICD-10-CM | POA: Diagnosis present

## 2011-10-01 DIAGNOSIS — Z880 Allergy status to penicillin: Secondary | ICD-10-CM

## 2011-10-01 DIAGNOSIS — Z6841 Body Mass Index (BMI) 40.0 and over, adult: Secondary | ICD-10-CM

## 2011-10-01 DIAGNOSIS — Z833 Family history of diabetes mellitus: Secondary | ICD-10-CM

## 2011-10-01 DIAGNOSIS — Z79899 Other long term (current) drug therapy: Secondary | ICD-10-CM

## 2011-10-01 DIAGNOSIS — G4733 Obstructive sleep apnea (adult) (pediatric): Secondary | ICD-10-CM | POA: Diagnosis present

## 2011-10-01 DIAGNOSIS — Z794 Long term (current) use of insulin: Secondary | ICD-10-CM

## 2011-10-01 DIAGNOSIS — Z8249 Family history of ischemic heart disease and other diseases of the circulatory system: Secondary | ICD-10-CM

## 2011-10-01 DIAGNOSIS — E871 Hypo-osmolality and hyponatremia: Secondary | ICD-10-CM | POA: Diagnosis present

## 2011-10-01 DIAGNOSIS — K3184 Gastroparesis: Secondary | ICD-10-CM | POA: Diagnosis present

## 2011-10-01 DIAGNOSIS — A4151 Sepsis due to Escherichia coli [E. coli]: Principal | ICD-10-CM | POA: Diagnosis present

## 2011-10-01 DIAGNOSIS — I10 Essential (primary) hypertension: Secondary | ICD-10-CM | POA: Diagnosis present

## 2011-10-01 DIAGNOSIS — N12 Tubulo-interstitial nephritis, not specified as acute or chronic: Secondary | ICD-10-CM

## 2011-10-01 DIAGNOSIS — A419 Sepsis, unspecified organism: Secondary | ICD-10-CM | POA: Diagnosis present

## 2011-10-01 DIAGNOSIS — R0902 Hypoxemia: Secondary | ICD-10-CM | POA: Diagnosis present

## 2011-10-01 HISTORY — DX: Morbid (severe) obesity due to excess calories: E66.01

## 2011-10-01 HISTORY — DX: Body Mass Index (BMI) 40.0 and over, adult: Z684

## 2011-10-01 HISTORY — DX: Other specified bacterial intestinal infections: A04.8

## 2011-10-01 MED ORDER — SODIUM CHLORIDE 0.9 % IV BOLUS (SEPSIS)
1000.0000 mL | Freq: Once | INTRAVENOUS | Status: AC
  Administered 2011-10-02: 1000 mL via INTRAVENOUS

## 2011-10-01 NOTE — ED Notes (Signed)
Discovered patient's incorrect birthdate and called Chanin, RN to move pt to adult side. Pt to be moved once Pod RN has taken report. RN unavailable for report at this time.

## 2011-10-01 NOTE — ED Provider Notes (Signed)
History     CSN: [id number]  Arrival date & time 10/01/11  2251   First MD Initiated Contact with Patient 10/01/11 2350      Chief Complaint  Patient presents with  . Altered Mental Status    (Consider location/radiation/quality/duration/timing/severity/associated sxs/prior treatment) HPI Comments: 26 year old female who presents with her mother stating that she has not been normal today. The mother states that she has been overall unresponsive, she is unable to get out of bed and is feeling extremely fatigued. She has not been making sense and the mother states that she was unresponsive prior to their arrival. They tried to help her down the stairs to get her to the hospital but she was unable to help walk. Over the last few days she has felt generally unwell and the patient is only able to say that she thinks she has a kidney infection these are the only records that she says.  Patient is a 26 y.o. female presenting with altered mental status. The history is provided by the patient and a parent. The history is limited by the condition of the patient (Altered mental status).  Altered Mental Status    Past Medical History  Diagnosis Date  . H. pylori infection   . Diabetes mellitus     History reviewed. No pertinent past surgical history.  No family history on file.  History  Substance Use Topics  . Smoking status: Not on file  . Smokeless tobacco: Not on file  . Alcohol Use:     OB History    Grav Para Term Preterm Abortions TAB SAB Ect Mult Living                  Review of Systems  Unable to perform ROS: Mental status change  Psychiatric/Behavioral: Positive for altered mental status.    Allergies  Penicillins  Home Medications   Current Outpatient Rx  Name Route Sig Dispense Refill  . ESOMEPRAZOLE MAGNESIUM 40 MG PO CPDR Oral Take 40 mg by mouth daily before breakfast.    . METFORMIN HCL 500 MG PO TABS Oral Take 500 mg by mouth 2 (two) times daily with a  meal.      BP 116/72  Pulse 149  Temp(Src) 102.2 F (39 C) (Oral)  Resp 24  Wt 274 lb (124.286 kg)  SpO2 98%  Physical Exam  Nursing note and vitals reviewed. Constitutional:       Obese, somnolent  HENT:  Head: Normocephalic and atraumatic.  Mouth/Throat: No oropharyngeal exudate.       Mucous membranes dry  Eyes: Conjunctivae and EOM are normal. Pupils are equal, round, and reactive to light. Right eye exhibits no discharge. Left eye exhibits no discharge. No scleral icterus.  Neck: Normal range of motion. Neck supple. No JVD present. No thyromegaly present.  Cardiovascular: Regular rhythm, normal heart sounds and intact distal pulses.  Exam reveals no gallop and no friction rub.   No murmur heard.      Active cardiac, thready radial artery pulses  Pulmonary/Chest: Effort normal and breath sounds normal. No respiratory distress. She has no wheezes. She has no rales.  Abdominal: Soft. Bowel sounds are normal. She exhibits no distension and no mass. There is tenderness ( Suprapubic tenderness, right CVA tenderness, guarding, non-peritoneal, no pain in the right upper quadrant or left lower quadrant).  Musculoskeletal: Normal range of motion. She exhibits no edema and no tenderness.  Lymphadenopathy:    She has no cervical adenopathy.  Neurological:  Somnolent but arousable, says the occasional word in answer to questions, is able to follow commands with all 4 extremities and extraocular movements. Speech is clear when she talks reviewed confused as to location and date, oriented to self  Skin: Skin is warm and dry. No rash noted. No erythema.    ED Course  Procedures (including critical care time)  Labs Reviewed  GLUCOSE, CAPILLARY - Abnormal; Notable for the following:    Glucose-Capillary 415 (*)    All other components within normal limits  CBC  DIFFERENTIAL  COMPREHENSIVE METABOLIC PANEL  APTT  PROTIME-INR  LACTIC ACID, PLASMA  PROCALCITONIN  URINALYSIS,  ROUTINE W REFLEX MICROSCOPIC  URINE CULTURE  CULTURE, BLOOD (ROUTINE X 2)   No results found.   No diagnosis found.    MDM  Vision appear septic from unknown source, possibly urine, blood cultures urine culture fluids antipyretics, anticipate admission for sepsis which is likely,   Patient is now awake and alert and answering questions. Her pulse is down to 120, her white blood cell count is 15,003 urinalysis shows only mild to moderate signs of infection. She now states over the last 3 days she is known that she's had a fever, she has had an abrupt decline in her functioning and comfort the last 24 hours and is felt severe fatigue, fever, weakness, nausea and right sided flank and lower abdominal pain. She has had urinary frequency but she also admits that she is a diabetic, she has known this for several years but has tried to avoid treatment. She was first started on metformin one week ago.  Urinalysis does show glucosuria, she appears dehydrated and likely septic. CT scan ordered to rule out perinephric abscess, appendicitis is discussed the cause of right lower quadrant and right flank pain with associated pyuria.  D/w Dr. Deterdring with PCCM - agrees with medical patient and not ICU at this time - BP is normal, pulse improving - currently in the 110's, procalcitonin elevated. abx ordered.  Ct pending.   5:00 AM, CT scan shows no significant or abnormal findings to explain the patient's symptoms. At this point the patient still needs sepsis criteria with tachycardia, fever, leukocytosis with a source of urinary infection. Her mental status has improved significantly after IV fluids and antibiotics.  I am concerned because of her lab work including elevated blood sugar in the 400s and the elevated procalcitonin, and acuity of symptoms and thus we'll admit her to the hospital for ongoing IV antibiotics. Cultures pending at this time, critical care delivered  CRITICAL CARE Performed by:  Johnna Acosta   Total critical care time: 35  Critical care time was exclusive of separately billable procedures and treating other patients.  Critical care was necessary to treat or prevent imminent or life-threatening deterioration.  Critical care was time spent personally by me on the following activities: development of treatment plan with patient and/or surrogate as well as nursing, discussions with consultants, evaluation of patient's response to treatment, examination of patient, obtaining history from patient or surrogate, ordering and performing treatments and interventions, ordering and review of laboratory studies, ordering and review of radiographic studies, pulse oximetry and re-evaluation of patient's condition.   I have discussed the plan of care with the family practice resident who will admit the patient to the hospital, holding orders requested, telemetry bed requested.  Johnna Acosta, MD 10/02/11 [phone]

## 2011-10-01 NOTE — ED Notes (Signed)
Mom brought pt in today because she feels that Sheila Austin is not responding. Pt has had abd pain x 1 week. Saw PCP for it last week and was diagnosed with a "stomach bug." mom states "they gave her medicine for it." no known fevers at home. +nausea, no vomiting, no diarrhea.

## 2011-10-02 ENCOUNTER — Emergency Department (HOSPITAL_COMMUNITY): Payer: 59

## 2011-10-02 ENCOUNTER — Encounter (HOSPITAL_COMMUNITY): Payer: Self-pay | Admitting: General Practice

## 2011-10-02 DIAGNOSIS — N390 Urinary tract infection, site not specified: Secondary | ICD-10-CM

## 2011-10-02 DIAGNOSIS — E119 Type 2 diabetes mellitus without complications: Secondary | ICD-10-CM

## 2011-10-02 DIAGNOSIS — N10 Acute tubulo-interstitial nephritis: Secondary | ICD-10-CM

## 2011-10-02 LAB — DIFFERENTIAL
Basophils Relative: 0 % (ref 0–1)
Eosinophils Absolute: 0 10*3/uL (ref 0.0–0.7)
Monocytes Relative: 13 % — ABNORMAL HIGH (ref 3–12)
Neutrophils Relative %: 73 % (ref 43–77)

## 2011-10-02 LAB — URINE MICROSCOPIC-ADD ON

## 2011-10-02 LAB — GLUCOSE, CAPILLARY
Glucose-Capillary: 304 mg/dL — ABNORMAL HIGH (ref 70–99)
Glucose-Capillary: 321 mg/dL — ABNORMAL HIGH (ref 70–99)
Glucose-Capillary: 306 mg/dL — ABNORMAL HIGH (ref 70–99)
Glucose-Capillary: 372 mg/dL — ABNORMAL HIGH (ref 70–99)
Glucose-Capillary: 285 mg/dL — ABNORMAL HIGH (ref 70–99)

## 2011-10-02 LAB — COMPREHENSIVE METABOLIC PANEL
Albumin: 2.8 g/dL — ABNORMAL LOW (ref 3.5–5.2)
Alkaline Phosphatase: 66 U/L (ref 39–117)
BUN: 10 mg/dL (ref 6–23)
Potassium: 3.5 mEq/L (ref 3.5–5.1)
Total Protein: 7.2 g/dL (ref 6.0–8.3)

## 2011-10-02 LAB — LACTIC ACID, PLASMA: Lactic Acid, Venous: 1.9 mmol/L (ref 0.5–2.2)

## 2011-10-02 LAB — CBC
Hemoglobin: 12.7 g/dL (ref 12.0–15.0)
MCH: 29.7 pg (ref 26.0–34.0)
MCHC: 35.6 g/dL (ref 30.0–36.0)
MCHC: 35.2 g/dL (ref 30.0–36.0)
Platelets: 198 10*3/uL (ref 150–400)
RDW: 13 % (ref 11.5–15.5)
WBC: 14.9 10*3/uL — ABNORMAL HIGH (ref 4.0–10.5)

## 2011-10-02 LAB — CREATININE, SERUM
Creatinine, Ser: 0.69 mg/dL (ref 0.50–1.10)
GFR calc Af Amer: >90 mL/min (ref >90)
GFR calc non Af Amer: >90 mL/min (ref >90)

## 2011-10-02 LAB — URINALYSIS, ROUTINE W REFLEX MICROSCOPIC
Bilirubin Urine: NEGATIVE
Ketones, ur: 40 mg/dL — AB
Nitrite: NEGATIVE
Protein, ur: 30 mg/dL — AB
Specific Gravity, Urine: 1.023 (ref 1.005–1.030)
Urobilinogen, UA: 0.2 mg/dL (ref 0.0–1.0)

## 2011-10-02 LAB — PROCALCITONIN: Procalcitonin: 2.98 ng/mL

## 2011-10-02 LAB — APTT: aPTT: 29 seconds (ref 24–37)

## 2011-10-02 LAB — PROTIME-INR: Prothrombin Time: 14.6 seconds (ref 11.6–15.2)

## 2011-10-02 MED ORDER — KETOROLAC TROMETHAMINE 30 MG/ML IJ SOLN
30.0000 mg | Freq: Three times a day (TID) | INTRAMUSCULAR | Status: DC
  Administered 2011-10-02 – 2011-10-04 (×7): 30 mg via INTRAVENOUS
  Filled 2011-10-02 (×10): qty 1

## 2011-10-02 MED ORDER — ACETAMINOPHEN 325 MG PO TABS
650.0000 mg | ORAL_TABLET | Freq: Four times a day (QID) | ORAL | Status: DC | PRN
  Administered 2011-10-02 – 2011-10-05 (×6): 650 mg via ORAL
  Filled 2011-10-02 (×6): qty 2

## 2011-10-02 MED ORDER — PANTOPRAZOLE SODIUM 40 MG IV SOLR
40.0000 mg | INTRAVENOUS | Status: DC
  Administered 2011-10-02: 40 mg via INTRAVENOUS
  Filled 2011-10-02 (×2): qty 40

## 2011-10-02 MED ORDER — ACETAMINOPHEN 500 MG PO TABS
1000.0000 mg | ORAL_TABLET | Freq: Once | ORAL | Status: AC
  Administered 2011-10-02: 1000 mg via ORAL
  Filled 2011-10-02: qty 2

## 2011-10-02 MED ORDER — ONDANSETRON HCL 4 MG/2ML IJ SOLN: 4.0000 mg | Freq: Three times a day (TID) | INTRAMUSCULAR | Status: DC | PRN

## 2011-10-02 MED ORDER — SODIUM CHLORIDE 0.9 % IV BOLUS (SEPSIS)
1000.0000 mL | Freq: Once | INTRAVENOUS | Status: AC
  Administered 2011-10-02: 1000 mL via INTRAVENOUS

## 2011-10-02 MED ORDER — SODIUM CHLORIDE 0.45 % IV SOLN: INTRAVENOUS | Status: DC

## 2011-10-02 MED ORDER — ACETAMINOPHEN 650 MG RE SUPP: 650.0000 mg | Freq: Four times a day (QID) | RECTAL | Status: DC | PRN

## 2011-10-02 MED ORDER — IOHEXOL 300 MG/ML  SOLN
20.0000 mL | INTRAMUSCULAR | Status: DC
  Administered 2011-10-02: 20 mL via ORAL

## 2011-10-02 MED ORDER — MORPHINE SULFATE 2 MG/ML IJ SOLN
1.0000 mg | INTRAMUSCULAR | Status: DC | PRN
  Administered 2011-10-02 – 2011-10-04 (×3): 1 mg via INTRAVENOUS
  Filled 2011-10-02 (×3): qty 1

## 2011-10-02 MED ORDER — INSULIN ASPART 100 UNIT/ML ~~LOC~~ SOLN
0.0000 [IU] | Freq: Three times a day (TID) | SUBCUTANEOUS | Status: DC
  Administered 2011-10-02 (×2): 15 [IU] via SUBCUTANEOUS
  Administered 2011-10-03: 5 [IU] via SUBCUTANEOUS
  Administered 2011-10-03: 15 [IU] via SUBCUTANEOUS
  Administered 2011-10-03: 11 [IU] via SUBCUTANEOUS
  Administered 2011-10-04 (×2): 5 [IU] via SUBCUTANEOUS
  Administered 2011-10-04 – 2011-10-05 (×3): 3 [IU] via SUBCUTANEOUS
  Administered 2011-10-05: 5 [IU] via SUBCUTANEOUS
  Administered 2011-10-06: 3 [IU] via SUBCUTANEOUS

## 2011-10-02 MED ORDER — SODIUM CHLORIDE 0.9 % IV SOLN: INTRAVENOUS | Status: DC

## 2011-10-02 MED ORDER — PIPERACILLIN-TAZOBACTAM 3.375 G IVPB
3.3750 g | Freq: Once | INTRAVENOUS | Status: DC
  Administered 2011-10-02: 3.375 g via INTRAVENOUS
  Filled 2011-10-02: qty 50

## 2011-10-02 MED ORDER — PROMETHAZINE HCL 12.5 MG PO TABS
12.5000 mg | ORAL_TABLET | Freq: Four times a day (QID) | ORAL | Status: DC | PRN
  Administered 2011-10-02 – 2011-10-04 (×3): 12.5 mg via ORAL
  Filled 2011-10-02 (×3): qty 1

## 2011-10-02 MED ORDER — DEXTROSE 5 % IV SOLN
1.0000 g | INTRAVENOUS | Status: DC
  Filled 2011-10-02: qty 10

## 2011-10-02 MED ORDER — ASPIRIN EC 81 MG PO TBEC
81.0000 mg | DELAYED_RELEASE_TABLET | Freq: Every day | ORAL | Status: DC
Start: 2011-10-02 — End: 2011-10-06
  Administered 2011-10-02 – 2011-10-06 (×5): 81 mg via ORAL
  Filled 2011-10-02 (×5): qty 1

## 2011-10-02 MED ORDER — INSULIN ASPART 100 UNIT/ML ~~LOC~~ SOLN
4.0000 [IU] | Freq: Three times a day (TID) | SUBCUTANEOUS | Status: DC
  Administered 2011-10-02 – 2011-10-05 (×10): 4 [IU] via SUBCUTANEOUS

## 2011-10-02 MED ORDER — ONDANSETRON HCL 4 MG/2ML IJ SOLN
INTRAMUSCULAR | Status: AC
  Administered 2011-10-03: 4 mg via INTRAVENOUS
  Filled 2011-10-02: qty 2

## 2011-10-02 MED ORDER — INSULIN ASPART 100 UNIT/ML ~~LOC~~ SOLN: 0.0000 [IU] | SUBCUTANEOUS | Status: DC

## 2011-10-02 MED ORDER — INSULIN GLARGINE 100 UNIT/ML ~~LOC~~ SOLN
5.0000 [IU] | Freq: Every day | SUBCUTANEOUS | Status: DC
  Administered 2011-10-02: 5 [IU] via SUBCUTANEOUS

## 2011-10-02 MED ORDER — SODIUM CHLORIDE 0.9 % IV SOLN
INTRAVENOUS | Status: DC
  Administered 2011-10-02 – 2011-10-04 (×5): via INTRAVENOUS

## 2011-10-02 MED ORDER — LEVOFLOXACIN IN D5W 500 MG/100ML IV SOLN
500.0000 mg | INTRAVENOUS | Status: DC
  Administered 2011-10-02 – 2011-10-04 (×3): 500 mg via INTRAVENOUS
  Filled 2011-10-02 (×4): qty 100

## 2011-10-02 MED ORDER — ONDANSETRON HCL 4 MG/2ML IJ SOLN
4.0000 mg | Freq: Four times a day (QID) | INTRAMUSCULAR | Status: DC | PRN
  Administered 2011-10-02 – 2011-10-04 (×4): 4 mg via INTRAVENOUS
  Filled 2011-10-02 (×4): qty 2

## 2011-10-02 MED ORDER — HEPARIN SODIUM (PORCINE) 5000 UNIT/ML IJ SOLN
5000.0000 [IU] | Freq: Three times a day (TID) | INTRAMUSCULAR | Status: DC
  Administered 2011-10-02 – 2011-10-03 (×4): 5000 [IU] via SUBCUTANEOUS
  Filled 2011-10-02 (×10): qty 1

## 2011-10-02 MED ORDER — SODIUM CHLORIDE 0.9 % IV SOLN
INTRAVENOUS | Status: AC
  Administered 2011-10-02: 2.4 [IU]/h via INTRAVENOUS
  Filled 2011-10-02: qty 1

## 2011-10-02 MED ORDER — ONDANSETRON HCL 4 MG/2ML IJ SOLN
4.0000 mg | Freq: Once | INTRAMUSCULAR | Status: AC
  Administered 2011-10-02: 4 mg via INTRAVENOUS

## 2011-10-02 MED ORDER — SODIUM CHLORIDE 0.9 % IV SOLN
1000.0000 mL | INTRAVENOUS | Status: DC
  Administered 2011-10-02: 1000 mL via INTRAVENOUS

## 2011-10-02 MED ORDER — INSULIN ASPART 100 UNIT/ML ~~LOC~~ SOLN
0.0000 [IU] | Freq: Every day | SUBCUTANEOUS | Status: DC
  Administered 2011-10-02: 3 [IU] via SUBCUTANEOUS
  Administered 2011-10-03: 2 [IU] via SUBCUTANEOUS

## 2011-10-02 MED ORDER — ONDANSETRON HCL 4 MG PO TABS
4.0000 mg | ORAL_TABLET | Freq: Four times a day (QID) | ORAL | Status: DC | PRN
  Administered 2011-10-05: 4 mg via ORAL
  Filled 2011-10-02: qty 1

## 2011-10-02 MED ORDER — SODIUM CHLORIDE 0.9 % IJ SOLN
3.0000 mL | Freq: Two times a day (BID) | INTRAMUSCULAR | Status: DC
  Administered 2011-10-03 – 2011-10-05 (×5): 3 mL via INTRAVENOUS

## 2011-10-02 MED ORDER — LIVING WELL WITH DIABETES BOOK
Freq: Once | Status: AC
  Administered 2011-10-03: 06:00:00
  Filled 2011-10-02: qty 1

## 2011-10-02 MED ORDER — HYDROMORPHONE HCL PF 1 MG/ML IJ SOLN
1.0000 mg | Freq: Once | INTRAMUSCULAR | Status: AC
  Administered 2011-10-02: 1 mg via INTRAVENOUS
  Filled 2011-10-02: qty 1

## 2011-10-02 MED ORDER — IOHEXOL 300 MG/ML  SOLN
100.0000 mL | Freq: Once | INTRAMUSCULAR | Status: AC | PRN
  Administered 2011-10-02: 100 mL via INTRAVENOUS

## 2011-10-02 NOTE — H&P (Signed)
Family Medicine Teaching Service Attending Note  I interviewed and examined patient Sheila Austin and reviewed their tests and x-rays.  I discussed with Dr. Francesco Sor  and reviewed their note for today.  I agree with their assessment and plan.     Additionally  This AM feels better still right sided flank pain radiating to groin and fever Started with her normal UTI syptoms.  No vaginal discharge always has irregular menstrual periods Abdomen - tender over RLQ and R CVA.  No guarding or rebound no suprpubic tenderness on deep palpation Most consistent with pyelo.  Would check bimanual pelvic to rule out PID. Await urine culture

## 2011-10-02 NOTE — H&P (Signed)
Sheila Austin is an 26 y.o. female.    PCP: Dr. 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  Patient states that she came home from work on Saturday feeling "feverish" and "shaky."  She then developed pelvic pressure with urination, increased frequency, and some burning with urination.  Patient has had a hx of UTI so this was a typical presentation for her and she was going to wait to be seen in our clinic.  Per mother, this morning patient became confused (not making sense when she spoke), so she brought her to ED.  On arrival to ED, temp 102.2 and patient lethargic, but easy to arouse.  She was also tachycardic 140.  After receiving Dilaudid and IVF, her mental status improved and she was able to answer questions.   Currently in ED, patient's HR improving, pain well controlled, and mental status normal. UA was positive for ketones, trace blood, and moderate leukocytes.  Anion gap 14.  CXR negative.  CT scan ordered to rule out perinephric abscess, appendicitis.  Due to improvement in clinical status and vitals, [facility]PTS called admit.  Past Medical History  Diagnosis Date  . H. pylori infection   . Diabetes mellitus   Gastroparesis Hypertension Ectopic pregnancy 2012  Family History: Mother has DM, hypertension, Crohn's Disease; Father deceased aneurysm at age 44  Social History:  Never smoker.  Denies any alcohol use or recreational drugs.  Currently sexually active with one partner.  Lives at home with mother, husband, and brother.  Works as a Quarry manager at [employer].  Allergies:  Allergies  Allergen Reactions  . Penicillins Anaphylaxis    Results for orders placed during the hospital encounter of 10/01/11 (from the past 48 hour(s))  GLUCOSE, CAPILLARY     Status: Abnormal   Collection Time   10/01/11 11:24 PM      Component Value Range  Comment   Glucose-Capillary 415 (*) 70 - 99 (mg/dL)   URINALYSIS, ROUTINE W REFLEX MICROSCOPIC     Status: Abnormal   Collection Time   10/02/11 12:18 AM      Component Value Range Comment   Color, Urine YELLOW  YELLOW     APPearance CLOUDY (*) CLEAR     Specific Gravity, Urine 1.023  1.005 - 1.030     pH 5.5  5.0 - 8.0     Glucose, UA >1000 (*) NEGATIVE (mg/dL)    Hgb urine dipstick TRACE (*) NEGATIVE     Bilirubin Urine NEGATIVE  NEGATIVE     Ketones, ur 40 (*) NEGATIVE (mg/dL)    Protein, ur 30 (*) NEGATIVE (mg/dL)    Urobilinogen, UA 0.2  0.0 - 1.0 (mg/dL)    Nitrite NEGATIVE  NEGATIVE     Leukocytes, UA SMALL (*) NEGATIVE    URINE MICROSCOPIC-ADD ON     Status: Normal   Collection Time   10/02/11 12:18 AM      Component Value Range Comment   Squamous Epithelial / LPF RARE  RARE     WBC, UA 11-20  <3 (WBC/hpf)    RBC / HPF 0-2  <3 (RBC/hpf)    Bacteria, UA RARE  RARE     Urine-Other AMORPHOUS URATES/PHOSPHATES     POCT PREGNANCY, URINE     Status: Normal   Collection Time   10/02/11 12:21 AM      Component  Value Range Comment   Preg Test, Ur NEGATIVE  NEGATIVE    CBC     Status: Abnormal   Collection Time   10/02/11 12:41 AM      Component Value Range Comment   WBC 15.0 (*) 4.0 - 10.5 (K/uL)    RBC 4.45  3.87 - 5.11 (MIL/uL)    Hemoglobin 13.2  12.0 - 15.0 (g/dL)    HCT 37.1  36.0 - 46.0 (%)    MCV 83.4  78.0 - 100.0 (fL)    MCH 29.7  26.0 - 34.0 (pg)    MCHC 35.6  30.0 - 36.0 (g/dL)    RDW 12.8  11.5 - 15.5 (%)    Platelets 198  150 - 400 (K/uL)   DIFFERENTIAL     Status: Abnormal   Collection Time   10/02/11 12:41 AM      Component Value Range Comment   Neutrophils Relative 73  43 - 77 (%)    Neutro Abs 11.0 (*) 1.7 - 7.7 (K/uL)    Lymphocytes Relative 13  12 - 46 (%)    Lymphs Abs 2.0  0.7 - 4.0 (K/uL)    Monocytes Relative 13 (*) 3 - 12 (%)    Monocytes Absolute 2.0 (*) 0.1 - 1.0 (K/uL)    Eosinophils Relative 0  0 - 5 (%)    Eosinophils Absolute 0.0  0.0 -  0.7 (K/uL)    Basophils Relative 0  0 - 1 (%)    Basophils Absolute 0.0  0.0 - 0.1 (K/uL)   COMPREHENSIVE METABOLIC PANEL     Status: Abnormal   Collection Time   10/02/11 12:41 AM      Component Value Range Comment   Sodium 128 (*) 135 - 145 (mEq/L)    Potassium 3.5  3.5 - 5.1 (mEq/L)    Chloride 92 (*) 96 - 112 (mEq/L)    CO2 22  19 - 32 (mEq/L)    Glucose, Bld 383 (*) 70 - 99 (mg/dL)    BUN 10  6 - 23 (mg/dL)    Creatinine, Ser 0.66  0.50 - 1.10 (mg/dL)    Calcium 8.5  8.4 - 10.5 (mg/dL)    Total Protein 7.2  6.0 - 8.3 (g/dL)    Albumin 2.8 (*) 3.5 - 5.2 (g/dL)    AST 14  0 - 37 (U/L)    ALT 11  0 - 35 (U/L)    Alkaline Phosphatase 66  39 - 117 (U/L)    Total Bilirubin 0.6  0.3 - 1.2 (mg/dL)    GFR calc non Af Amer >90  >90 (mL/min)    GFR calc Af Amer >90  >90 (mL/min)   APTT     Status: Normal   Collection Time   10/02/11 12:41 AM      Component Value Range Comment   aPTT 29  24 - 37 (seconds)   PROTIME-INR     Status: Normal   Collection Time   10/02/11 12:41 AM      Component Value Range Comment   Prothrombin Time 14.6  11.6 - 15.2 (seconds)    INR 1.12  0.00 - 1.49    LACTIC ACID, PLASMA     Status: Normal   Collection Time   10/02/11 12:41 AM      Component Value Range Comment   Lactic Acid, Venous 1.9  0.5 - 2.2 (mmol/L)   PROCALCITONIN     Status: Normal   Collection Time   10/02/11  1:05 AM      Component Value Range Comment   Procalcitonin 2.98      Ct Abdomen Pelvis W Contrast  10/02/2011  *RADIOLOGY REPORT*  Clinical Data: Right-sided abdominal pain, nausea, and altered mental status.  CT ABDOMEN AND PELVIS WITH CONTRAST  Technique:  Multidetector CT imaging of the abdomen and pelvis was performed following the standard protocol during bolus administration of intravenous contrast.  Contrast: 167mL OMNIPAQUE IOHEXOL 300 MG/ML  SOLN  Comparison: None.  Findings: The lung bases are clear.  Diffuse low attenuation change throughout the liver consistent with fatty  infiltration.  The gallbladder, spleen, pancreas, adrenal glands, kidneys, abdominal aorta, and retroperitoneal lymph nodes are unremarkable.  The stomach and small bowel are decompressed.  Scattered stool in the colon without distension.  No free air or free fluid in the abdomen.  Pelvis:  Foley catheter decompresses the bladder.  Uterus and adnexal structures are not enlarged.  Diverticula in the sigmoid colon without diverticulitis.  The appendix is normal.  No free or loculated pelvic fluid collections.  Normal alignment of the lumbar vertebrae.  IMPRESSION: Diffuse fatty infiltration of the liver.  No focal acute process demonstrated in the abdomen or pelvis.  Original Report Authenticated By: Neale Burly, M.D.   Dg Chest Port 1 View  10/02/2011  *RADIOLOGY REPORT*  Clinical Data: Shortness of breath.  CHEST - 1 VIEW  Comparison:  None.  Findings: The heart size and mediastinal contours are within normal limits.  Both lungs are clear.  IMPRESSION: No active disease.  Original Report Authenticated By: Staci Righter, M.D.    ROS  Per HPI  Blood pressure 108/69, pulse 118, temperature 98.9 F (37.2 C), temperature source Oral, resp. rate 26, weight 274 lb (124.286 kg), last menstrual period 07/04/2011, SpO2 100.00%. Physical Exam  Constitutional: She is oriented to person, place, and time. No distress.       Morbidly obese  HENT:  Head: Normocephalic and atraumatic.  Neck: Normal range of motion. Neck supple.  Cardiovascular:  No murmur heard.      Sinus tachycardia  Respiratory: Effort normal and breath sounds normal. She has no wheezes. She has no rales.  GI: Soft. Bowel sounds are normal. She exhibits no distension. There is tenderness. There is no rebound and no guarding.       Mild tenderness on palpation of RLQ and LLQ.  Musculoskeletal: Normal range of motion.  Neurological: She is alert and oriented to person, place, and time.       No focal deficits.  Skin: No rash  noted. No erythema.       pale     Assessment/Plan 26 year old female with DM presents with presumed Urosepsis, abdominal pain and fever.  # Fever/UTI: presumed urosepsis, pending urine culture.  UA positive for ketones, leukocytes, trace blood.  Patient is septic - febrile, WBC 15, and tachycardic, but fever and HR improving in ED. CXR negative for active disease. - Will admit to telemetry - Start Ceftriaxone IV daily - Continue Tylenol PRN for fever/pain - Start Toradol 30 IV Q8 hr scheduled for pain - Follow up urine and blood cultures - Repeat CBC tomorrow and follow fever curve - Hydrate with 1/2 NS @ 125 cc/hr  # DM2:  Insulin drip in ED.   - Hold home Metformin - Continue transition to Lantus and SSI - A1c  # Hx H.pylori: No complaints of reflux of gastritis at this time. - Protonix 40 IV daily  #  Hypertension: BP within normal limits. - Hold HCTZ for now, restart if BP starts to increase  # Gastroparesis: No home medications. - Slowly advance diet   FEN/GI: clear liquids and advance as tolerated, Insulin drip, 1/2 NS @ 125 cc/hr PPx: Heparin SQ Dispo: pending clinical improvement  DE LA CRUZ,Sumiko Ceasar 10/02/2011, 5:58 AM

## 2011-10-02 NOTE — Progress Notes (Signed)
ANTIBIOTIC CONSULT NOTE - INITIAL  Pharmacy Consult for levaquin Indication: Urosepsis  Allergies  Allergen Reactions  . Penicillins Anaphylaxis    Patient Measurements: Height: 5\' 2"  (157.5 cm) Weight: 274 lb (124.286 kg) IBW/kg (Calculated) : 50.1    Vital Signs: Temp: 101.3 F (38.5 C) (05/28 0920) Temp src: Oral (05/28 0920) BP: 109/71 mmHg (05/28 0821) Pulse Rate: 132  (05/28 0821)    Labs:  Basename 10/02/11 0041  WBC 15.0*  HGB 13.2  PLT 198  LABCREA --  CREATININE 0.66   Estimated Creatinine Clearance: 137.8 ml/min (by C-G formula based on Cr of 0.66).  Medical History: Past Medical History  Diagnosis Date  . H. pylori infection   . Diabetes mellitus     Medications:  Prescriptions prior to admission  Medication Sig Dispense Refill  . esomeprazole (NEXIUM) 40 MG capsule Take 40 mg by mouth daily before breakfast.      . metFORMIN (GLUCOPHAGE) 500 MG tablet Take 500 mg by mouth 2 (two) times daily with a meal.       Assessment: 26 yo female here with fever to start levaquin. Patient was ordered Zosyn but was noted with a history of PCN allergy with anaphylaxis.  The patient did receive a dose of Zosyn in the ED this am with no events documented.  I spoke with the MD this am and will start levaquin for now with possible changes later today.   Plan:  -Will give levaquin 500mg  IV q24hr -Will follow blood and urine cultures (already collected) -Will follow renal function and progress.  Thank you for asking pharmacy to be involved in the care of this patient.  Hildred Laser, Pharm D 10/02/2011 9:43 AM

## 2011-10-02 NOTE — Progress Notes (Signed)
Per Dr. Erin Hearing and Dr. Gwenyth Ober, insulin drip stopped. Will continue to monitor patient ACHS (per orders) and administer SSI/meal coverage. Hulen Luster, RN

## 2011-10-02 NOTE — ED Notes (Signed)
14 FR foley placed using sterile technique, clear urine returned. Pt tolerated well

## 2011-10-02 NOTE — ED Notes (Signed)
Started drinking contrast.

## 2011-10-02 NOTE — Progress Notes (Signed)
Inpatient Diabetes Program Recommendations  AACE/ADA: New Consensus Statement on Inpatient Glycemic Control (2009)  Target Ranges:  Prepandial:   less than 140 mg/dL      Peak postprandial:   less than 180 mg/dL (1-2 hours)      Critically ill patients:  140 - 180 mg/dL   Reason for Visit: Hyperglycemia  Inpatient Diabetes Program Recommendations Insulin - IV drip/GlucoStabilizer: Glucostabilizer stopped this morning. Insulin - Basal: Lantus 5 units to start every HS Correction (SSI): On moderate correction scale. Insulin - Meal Coverage: Getting 4 units meal coverage tid Oral Agents: Was on a total of 2000mg  Metformin (500 mg qid) started about 3 weeks ago, but over past few days has been sick-- thought it was from Metformin. HgbA1C: States last Hbg A1C was 13 at [facility]-- but did not find this value in the computer. Outpatient Referral: Has plans to go for Diabetes Classes offerred for [employer] employees. Diet: Has never had formal diabetes education.  Plans to attend classes regarding diabetes self-management offered through [employer]  Also is followed at [facility], so  should have access to educators there.  Note: Patient states that she was diagnosed with diabetes about 2 years ago but thought that if she didn't think about it or do anything about it, it would not be a problem.  Decided to get a PCP recently in [facility].  Requested dietitian consult since patient is essentially "new-onset" status.  Ordered diabetes booklet for her.  Instructed her regarding access to Patient Education Network.  To begin Lantus 5 units tonight.  Encouraged patient to give own insulin whenever possible in case she requires insulin at home.  Does not have a glucose meter at home-- will need to get one.  Notified Maurice of admission.  Will likely need upward titration of Lantus insulin. Zebulon Gantt S. Marcelline Mates, RN, [place], [place]  [phone])

## 2011-10-02 NOTE — Progress Notes (Signed)
Brief Progress Note Sheila Austin. Sheila Austin, M.D., M.B.A  Family Medicine PGY-1 Pager [phone]  Patient name: Sheila Austin Medical record number: [id number] Date of birth: [birth year] Age: 26 y.o. Gender: female    S: patient with severe abdominal pain, 7/10, located in right upper quadrant, not improved with certain position, has been increasing in severity throughout the day, previously associated with n/v but controlled on zofran and phenergan, no diarrhea currently, no hx of gall bladder problems  O: BP 103/60  Pulse 114  Temp(Src) 99.3 F (37.4 C) (Oral)  Resp 20  Ht 5\' 2"  (1.575 m)  Wt 274 lb (124.286 kg)  BMI 50.12 kg/m2  SpO2 96%  LMP 08/01/2011 Gen: young WF, obese, awake, pleasant, mild distress Abd: obese, NDNT, NABS, exquisite TTP in RUQ, all other quadrants non-tender  A/P: 26 y.o. WF with urosepsis and hypoglycemia now having severe RUQ abdominal pain; CT scan negative for gall bladder findings at 4:00 AM, but still could be development of cholecystitis; also could be cramping from diarrheal illness - start morphine 1 mg q 2 - consider u/s or blood work to look for obstructing stone   Jeralene Peters, MD

## 2011-10-03 LAB — CBC
MCH: 28.8 pg (ref 26.0–34.0)
MCHC: 34.3 g/dL (ref 30.0–36.0)
Platelets: 171 10*3/uL (ref 150–400)
RDW: 13.2 % (ref 11.5–15.5)

## 2011-10-03 LAB — CLOSTRIDIUM DIFFICILE BY PCR: Toxigenic C. Difficile by PCR: NEGATIVE

## 2011-10-03 LAB — BASIC METABOLIC PANEL
BUN: 11 mg/dL (ref 6–23)
Calcium: 7.9 mg/dL — ABNORMAL LOW (ref 8.4–10.5)
Creatinine, Ser: 0.77 mg/dL (ref 0.50–1.10)
GFR calc Af Amer: >90 mL/min (ref >90)
GFR calc non Af Amer: >90 mL/min (ref >90)
Glucose, Bld: 299 mg/dL — ABNORMAL HIGH (ref 70–99)

## 2011-10-03 LAB — GLUCOSE, CAPILLARY: Glucose-Capillary: 300 mg/dL — ABNORMAL HIGH (ref 70–99)

## 2011-10-03 LAB — GC/CHLAMYDIA PROBE AMP, GENITAL
Chlamydia, DNA Probe: NEGATIVE
GC Probe Amp, Genital: NEGATIVE

## 2011-10-03 LAB — URINE CULTURE

## 2011-10-03 MED ORDER — BD GETTING STARTED TAKE HOME KIT: 1/2ML X 30G SYRINGES
1.0000 | Freq: Once | Status: DC
  Filled 2011-10-03: qty 1

## 2011-10-03 MED ORDER — INSULIN PEN STARTER KIT
1.0000 | Freq: Once | Status: AC
  Administered 2011-10-03: 1
  Filled 2011-10-03: qty 1

## 2011-10-03 MED ORDER — POTASSIUM CHLORIDE 10 MEQ/100ML IV SOLN
10.0000 meq | INTRAVENOUS | Status: AC
  Administered 2011-10-03 (×4): 10 meq via INTRAVENOUS
  Filled 2011-10-03 (×4): qty 100

## 2011-10-03 MED ORDER — BOOST / RESOURCE BREEZE PO LIQD: 1.0000 | Freq: Two times a day (BID) | ORAL | Status: DC | PRN

## 2011-10-03 MED ORDER — METOCLOPRAMIDE HCL 5 MG/ML IJ SOLN
5.0000 mg | Freq: Once | INTRAMUSCULAR | Status: AC
  Administered 2011-10-03: 5 mg via INTRAVENOUS
  Filled 2011-10-03: qty 1

## 2011-10-03 MED ORDER — PANTOPRAZOLE SODIUM 40 MG PO TBEC
40.0000 mg | DELAYED_RELEASE_TABLET | Freq: Every day | ORAL | Status: DC
  Administered 2011-10-03 – 2011-10-05 (×3): 40 mg via ORAL
  Filled 2011-10-03 (×2): qty 1

## 2011-10-03 MED ORDER — INSULIN GLARGINE 100 UNIT/ML ~~LOC~~ SOLN
25.0000 [IU] | Freq: Every day | SUBCUTANEOUS | Status: DC
  Administered 2011-10-03: 25 [IU] via SUBCUTANEOUS

## 2011-10-03 NOTE — Progress Notes (Signed)
INITIAL ADULT NUTRITION ASSESSMENT Date: 10/03/2011   Time: 12:22 PM  Reason for Assessment: Consult (Education)  ASSESSMENT: Female 26 y.o.  Dx: fever, urosepsis  Hx:  Past Medical History  Diagnosis Date  . H. pylori infection   . GERD (gastroesophageal reflux disease)   . Diabetes mellitus     Related Meds:     . aspirin EC  81 mg Oral Daily  . heparin  5,000 Units Subcutaneous Q8H  . insulin aspart  0-15 Units Subcutaneous TID WC  . insulin aspart  0-5 Units Subcutaneous QHS  . insulin aspart  4 Units Subcutaneous TID WC  . insulin glargine  25 Units Subcutaneous QHS  . ketorolac  30 mg Intravenous Q8H  . levofloxacin (LEVAQUIN) IV  500 mg Intravenous Q24H  . living well with diabetes book   Does not apply Once  . metoCLOPramide (REGLAN) injection  5 mg Intravenous Once  . pantoprazole  40 mg Oral Q1200  . potassium chloride  10 mEq Intravenous Q1 Hr x 4  . sodium chloride  3 mL Intravenous Q12H  . DISCONTD: insulin glargine  5 Units Subcutaneous QHS  . DISCONTD: pantoprazole (PROTONIX) IV  40 mg Intravenous Q24H    Ht: 5\' 2"  (157.5 cm)  Wt: 274 lb (124.286 kg)  Ideal Wt: 50 kg % Ideal Wt: 248%  Usual Wt: --- % Usual Wt: ---  Body mass index is 50.12 kg/(m^2).  Food/Nutrition Related Hx: no triggers per admission nutrition screen  Labs:  CMP     Component Value Date/Time   NA 131* 10/03/2011 0540   K 3.1* 10/03/2011 0540   CL 98 10/03/2011 0540   CO2 21 10/03/2011 0540   GLUCOSE 299* 10/03/2011 0540   BUN 11 10/03/2011 0540   CREATININE 0.77 10/03/2011 0540   CALCIUM 7.9* 10/03/2011 0540   PROT 7.2 10/02/2011 0041   ALBUMIN 2.8* 10/02/2011 0041   AST 14 10/02/2011 0041   ALT 11 10/02/2011 0041   ALKPHOS 66 10/02/2011 0041   BILITOT 0.6 10/02/2011 0041   GFRNONAA >90 10/03/2011 0540   GFRAA >90 10/03/2011 0540     Intake/Output Summary (Last 24 hours) at 10/03/11 1226 Last data filed at 10/03/11 0957  Gross per 24 hour  Intake    363 ml  Output    250  ml  Net    113 ml    CBG (last 3)   Basename 10/03/11 1141 10/03/11 0725 10/02/11 2146  GLUCAP 237* 323* 285*    Diet Order: Carb Control  Supplements/Tube Feeding: N/A  IVF:    sodium chloride Last Rate: 150 mL/hr at 10/03/11 0431    Estimated Nutritional Needs:   Kcal: 1700-1900 Protein: 80-90 gm Fluid: 1.7-1.9 L  Patient admitted with lower abdominal pain, nausea and vomiting; reports a poor appetite at this time; states she been "afraid to eat" given vomiting episodes; currently on a Carbohydrate Modified Medium Calorie diet; no % PO intake recorded this AM for breakfast; noted episode of emesis this AM (5:24); C-diff negative; patient at nutrition risk given poor appetite & N/V  NUTRITION DIAGNOSIS: -Inadequate oral intake (NI-2.1).  Status: Ongoing  RELATED TO: poor appetite, nausea  AS EVIDENCE BY: patient report  MONITORING/EVALUATION(Goals): Goal: meet >90% of estimated nutrition needs Monitor: PO intake, labs, weight, I/O's  EDUCATION NEEDS: -Education needs addressed  INTERVENTION:  Add Facilities manager supplement 2 times daily PRN (250 kcals, 9 gm protein per 8 fl oz carton)  RD to follow for nutrition care  plan  Dietitian #: [phone]  [place] Per approved criteria  -Morbid Obesity    Lady Deutscher 10/03/2011, 12:22 PM

## 2011-10-03 NOTE — Plan of Care (Signed)
Problem: Problem: Diabetes Management Progression Goal: INCREASE DIABETES KNOWLEDGE Outcome: Completed/Met Date Met:  10/03/11 DISCUSSED WITH PATIENT DIABETES AND GOING HOME ON INSULIN.  PATIENT STATES SHE WOULD PREFER TO USE INSULIN PENS, AS THIS IS WHAT SHE HAS USED IN THE PAST.  SPOKE WITH DIABETES COORDINATOR AND THEY ARE TO FOLLOW UP WITH PATIENT IN THE AM.  INSULIN PEN STARTER KIT GIVEN TO PATIENT.  PATIENT ALSO GAVE INSULIN SELF INJECTION THIS AFTERNOON AT DINN[facility].

## 2011-10-03 NOTE — Plan of Care (Signed)
Problem: Limited Adherence to Nutrition-Related Recommendations (NB-1.6) Goal: Nutrition education Formal process to instruct or train a patient/client in a skill or to impart knowledge to help patients/clients voluntarily manage or modify food choices and eating behavior to maintain or improve health.  Outcome: Completed/Met Date Met:  10/03/11 RD consult for DM diet education. Reviewed handouts and recommendations. Per patient recall, drinks regular sodas regularly; RD encouraged other diet/sugar-free beverage options. Handouts provided from [employer]. Expect fair compliance. RD available to answer further questions.  Sheila Austin Pager #: [phone]

## 2011-10-03 NOTE — Progress Notes (Signed)
Utilization Review Completed.Kue Fox T5/29/2013   

## 2011-10-03 NOTE — Progress Notes (Signed)
Lab called about pt being positive for a gram negative rod around 0015. MD on call was notified at [place]. MD on call returned my call at [place]. Will continue to monitor the pt. Hoover Brunette

## 2011-10-03 NOTE — Progress Notes (Signed)
Daily Progress Note [name]. [name], M.D., M.B.A  Family Medicine PGY-1 Pager [phone]  Patient name: Sheila Austin Medical record number: [id number] Date of birth: [birth year] Age: 26 y.o. Gender: female  Subjective: Overnight pt with severe RUQ pain, required 1 mg morphine  1. RUQ pain - much improved 2. Nausea & vomiting - persistent nausea, no vomiting this AM, has not eaten 3. Diarrhea - resolved, C. Diff negative   Objective: Vital signs in last 24 hours: Temp:  [98.4 F (36.9 C)-100.5 F (38.1 C)] 100.5 F (38.1 C) (05/29 [id number]) Pulse Rate:  [106-116] 116  (05/29 0638) Resp:  [20] 20  (05/29 [id number]) BP: (103-109)/(60-75) 108/75 mmHg (05/29 0638) SpO2:  [95 %-98 %] 95 % (05/29 [id number]) Weight change:  Last BM Date: 10/02/11  Intake/Output from previous day: 05/28 0701 - 05/29 0700 In: 360 [P.O.:360] Out: 750 [Urine:750] Intake/Output this shift:    Constitutional: obese young WF, very pleasant and conversant,oriented x 4, Mild distress HENT:  Head: Normocephalic and atraumatic.  Neck: Normal range of motion. Neck supple.  Cardiovascular:  No murmur heard. Sinus tachycardia  Respiratory: Effort normal and breath sounds normal. She has no wheezes. She has no rales.  GI: Soft. Bowel sounds are normal. She exhibits no distension. There is tenderness in RUQ. There is no rebound and no guarding.  Mild tenderness on palpation of RLQ and LLQ.  Musculoskeletal: Normal range of motion.  Neurological: She is alert and oriented to person, place, and time.  No focal deficits.  Skin: No rash noted. No erythema.  pale    Lab Results:  Basename 10/03/11 0540 10/02/11 0859  WBC 12.0* 14.9*  HGB 11.5* 12.7  HCT 33.5* 36.1  PLT 171 179   BMET  Basename 10/03/11 0540 10/02/11 0859 10/02/11 0041  NA 131* -- 128*  K 3.1* -- 3.5  CL 98 -- 92*  CO2 21 -- 22  GLUCOSE 299* -- 383*  BUN 11 -- 10  CREATININE 0.77 0.69 --  CALCIUM 7.9* -- 8.5    CBG (last 3)    Basename 10/03/11 0725 10/02/11 2146 10/02/11 1640  GLUCAP 323* 285* 372*   Lab Results  Component Value Date   HGBA1C 11.4* 10/02/2011      Studies/Results: Ct Abdomen Pelvis W Contrast  10/02/2011  *RADIOLOGY REPORT*  Clinical Data: Right-sided abdominal pain, nausea, and altered mental status.  CT ABDOMEN AND PELVIS WITH CONTRAST  Technique:  Multidetector CT imaging of the abdomen and pelvis was performed following the standard protocol during bolus administration of intravenous contrast.  Contrast: 159mL OMNIPAQUE IOHEXOL 300 MG/ML  SOLN  Comparison: None.  Findings: The lung bases are clear.  Diffuse low attenuation change throughout the liver consistent with fatty infiltration.  The gallbladder, spleen, pancreas, adrenal glands, kidneys, abdominal aorta, and retroperitoneal lymph nodes are unremarkable.  The stomach and small bowel are decompressed.  Scattered stool in the colon without distension.  No free air or free fluid in the abdomen.  Pelvis:  Foley catheter decompresses the bladder.  Uterus and adnexal structures are not enlarged.  Diverticula in the sigmoid colon without diverticulitis.  The appendix is normal.  No free or loculated pelvic fluid collections.  Normal alignment of the lumbar vertebrae.  IMPRESSION: Diffuse fatty infiltration of the liver.  No focal acute process demonstrated in the abdomen or pelvis.  Original Report Authenticated By: Neale Burly, M.D.   Dg Chest Port 1 View  10/02/2011  *RADIOLOGY REPORT*  Clinical Data: Shortness of breath.  CHEST - 1 VIEW  Comparison:  None.  Findings: The heart size and mediastinal contours are within normal limits.  Both lungs are clear.  IMPRESSION: No active disease.  Original Report Authenticated By: Staci Righter, M.D.    Medications: I have reviewed the patient's current medications.  Assessment/Plan: 26 year old female with DM presents with presumed Urosepsis, abdominal pain and fever.   # Fever/UTI:  presumed urosepsis, pending. UA positive for ketones, leukocytes, trace blood. Patient is septic - febrile, WBC 15, and tachycardic, but fever and HR improving in ED. CXR negative for active disease.  - E. Coli from urine Cx and GRN from blood, awaiting sensitivities - pt on moxifloxacin, which could cover E. Coli, avoid CTX since has PCN anaphylaxis    # RUQ Pain: likely 2/2 pyelo, CT scan on 5/28 negative, but cholecystitis still possible  - continue morphine PRN, consider u/s if symptoms persistent   # Diarrhea: resolved 5/29, c. Diff negative   # DM2: Insulin drip in ED. Transitioned off insulin 5/28 since not acidotic; A1c 11.4 - total insulin requirement yesterday was 57 units, with very poor control - increase Lantus to 25 units qhs, continue sliding scale and meals coverage    # Hx H.pylori: No complaints of reflux of gastritis at this time.  - Protonix 40 IV daily   # Hypertension: BP within normal limits.  - Hold HCTZ for now, restart if BP starts to increase   # Gastroparesis: No home medications.  - Slowly advance diet   # Hyponatremia - likely 2/2 hypovolemia  - continue NS IVF  # Hypokalemia - secondary to diarrhea and insulin - replete today via IV  FEN/GI: clear liquids and advance as tolerated, NS @ 150 ml/hr PPx: Heparin SQ Dispo: pending clinical improvement         LOS: 2 days   Dewain Penning 10/03/2011, 9:38 AM

## 2011-10-03 NOTE — Progress Notes (Signed)
Around 0200 Pt was  complaining of being nauseous. Pt had an episode of vomiting. Her emesis was green in color. Pt was medicated with zofran IV and continued to be nauseous so at 0431 pt was given 12.5 of phenergan PO. Will continue to monitor the pt. Hoover Brunette

## 2011-10-03 NOTE — Progress Notes (Signed)
Seen and examined.  Discussed with Dr. [name].  Agree with his management.  She is definitely feeling better.  We have + urine and blood cultures confirming the diagnosis of pyelo and urosepsis.  She continues to have Rt. UQ pain which is likely due to an atypical symptom of her pyelo.  PO intake is still poor and she has considerable nausea.  This will need to clear before we can DC.

## 2011-10-04 ENCOUNTER — Inpatient Hospital Stay (HOSPITAL_COMMUNITY): Payer: 59

## 2011-10-04 DIAGNOSIS — R0609 Other forms of dyspnea: Secondary | ICD-10-CM

## 2011-10-04 DIAGNOSIS — R0989 Other specified symptoms and signs involving the circulatory and respiratory systems: Secondary | ICD-10-CM

## 2011-10-04 LAB — CBC
HCT: 31.9 % — ABNORMAL LOW (ref 36.0–46.0)
Platelets: 187 10*3/uL (ref 150–400)
RBC: 3.84 MIL/uL — ABNORMAL LOW (ref 3.87–5.11)
RDW: 13.3 % (ref 11.5–15.5)
WBC: 9.7 10*3/uL (ref 4.0–10.5)

## 2011-10-04 LAB — BLOOD GAS, ARTERIAL
Bicarbonate: 20.1 mEq/L (ref 20.0–24.0)
Drawn by: 31101
O2 Content: 2 L/min
O2 Saturation: 97.3 %
pCO2 arterial: 30 mmHg — ABNORMAL LOW (ref 35.0–45.0)
pO2, Arterial: 74.5 mmHg — ABNORMAL LOW (ref 80.0–100.0)

## 2011-10-04 LAB — BASIC METABOLIC PANEL
Chloride: 101 mEq/L (ref 96–112)
Creatinine, Ser: 0.75 mg/dL (ref 0.50–1.10)
GFR calc Af Amer: >90 mL/min (ref >90)
Potassium: 3.4 mEq/L — ABNORMAL LOW (ref 3.5–5.1)
Sodium: 135 mEq/L (ref 135–145)

## 2011-10-04 LAB — D-DIMER, QUANTITATIVE: D-Dimer, Quant: 4.43 ug/mL-FEU — ABNORMAL HIGH (ref 0.00–0.48)

## 2011-10-04 LAB — GLUCOSE, CAPILLARY: Glucose-Capillary: 220 mg/dL — ABNORMAL HIGH (ref 70–99)

## 2011-10-04 MED ORDER — FUROSEMIDE 10 MG/ML IJ SOLN
80.0000 mg | Freq: Once | INTRAMUSCULAR | Status: AC
  Administered 2011-10-04: 80 mg via INTRAVENOUS
  Filled 2011-10-04: qty 8

## 2011-10-04 MED ORDER — IOHEXOL 350 MG/ML SOLN
100.0000 mL | Freq: Once | INTRAVENOUS | Status: AC | PRN
  Administered 2011-10-04: 100 mL via INTRAVENOUS

## 2011-10-04 MED ORDER — HEPARIN SODIUM (PORCINE) 1000 UNIT/ML IJ SOLN
5000.0000 [IU] | Freq: Three times a day (TID) | INTRAMUSCULAR | Status: DC
  Filled 2011-10-04 (×3): qty 5

## 2011-10-04 MED ORDER — RIVAROXABAN 15 MG PO TABS
15.0000 mg | ORAL_TABLET | Freq: Two times a day (BID) | ORAL | Status: DC
  Filled 2011-10-04 (×3): qty 1

## 2011-10-04 MED ORDER — LORAZEPAM 2 MG/ML IJ SOLN
1.0000 mg | Freq: Once | INTRAMUSCULAR | Status: AC
  Administered 2011-10-04: 1 mg via INTRAVENOUS

## 2011-10-04 MED ORDER — POTASSIUM CHLORIDE 10 MEQ/100ML IV SOLN
10.0000 meq | INTRAVENOUS | Status: AC
  Administered 2011-10-04 (×2): 10 meq via INTRAVENOUS
  Filled 2011-10-04 (×2): qty 100

## 2011-10-04 MED ORDER — KETOROLAC TROMETHAMINE 30 MG/ML IJ SOLN
30.0000 mg | Freq: Three times a day (TID) | INTRAMUSCULAR | Status: DC | PRN
  Administered 2011-10-04: 30 mg via INTRAVENOUS

## 2011-10-04 MED ORDER — INSULIN GLARGINE 100 UNIT/ML ~~LOC~~ SOLN
30.0000 [IU] | Freq: Every day | SUBCUTANEOUS | Status: DC
  Administered 2011-10-04 – 2011-10-05 (×2): 30 [IU] via SUBCUTANEOUS

## 2011-10-04 MED ORDER — LORAZEPAM 2 MG/ML IJ SOLN
INTRAMUSCULAR | Status: AC
  Administered 2011-10-04: 1 mg via INTRAVENOUS
  Filled 2011-10-04: qty 1

## 2011-10-04 MED ORDER — HEPARIN SODIUM (PORCINE) 5000 UNIT/ML IJ SOLN
5000.0000 [IU] | Freq: Three times a day (TID) | INTRAMUSCULAR | Status: DC
  Administered 2011-10-04 – 2011-10-06 (×7): 5000 [IU] via SUBCUTANEOUS
  Filled 2011-10-04 (×10): qty 1

## 2011-10-04 NOTE — Progress Notes (Signed)
Seen and examined.  I am concerned about her episode of tachycardia and tachypnea last night.  We have ruled out important cardiac or PE cause.  Perhaps this is still related to her sepsis and await sensitivities on the Gm neg rod in her blood culture - which should be the same as in urine.  Observe in hospital for now.  Back off fluids because of the suggestion of pulm edema on CT scan.  We shall see.

## 2011-10-04 NOTE — Progress Notes (Signed)
Inpatient Diabetes Program Recommendations  AACE/ADA: New Consensus Statement on Inpatient Glycemic Control (2009)  Target Ranges:  Prepandial:   less than 140 mg/dL      Peak postprandial:   less than 180 mg/dL (1-2 hours)      Critically ill patients:  140 - 180 mg/dL   Diabetes Coordinator spoke with patient via telephone about going over insulin pen instructions.  Patient said she was previously prescribed and used the Lantus SoloStar Pen.  She is confident she can still use the pen correctly.  Patient is not feeling well today and wants to postpone pen demonstration until the morning.  Will follow-up with her in the morning.    Thank you  Raoul Pitch [facility] Inpatient Diabetes Coordinator [phone]

## 2011-10-04 NOTE — Progress Notes (Signed)
Around 0350 pt complained of SOB and cough. Pt's vs were taken and she was noted to have a fever of 102.2. VS are charted. RR RN looked at pt as well as Agricultural consultant. Pt was started on 2L O2 via [place]. MD on call was notified. New orders were given. Will continue to monitor the pt. Hoover Brunette

## 2011-10-04 NOTE — Progress Notes (Signed)
  Echocardiogram 2D Echocardiogram has been performed.  Sheila Austin 10/04/2011, 6:16 PM

## 2011-10-04 NOTE — Progress Notes (Signed)
Daily Progress Note Sheila Austin. Sheila Austin, M.D., M.B.A  Family Medicine PGY-1 Pager [phone]  Patient name: Sheila Austin Medical record number: [id number] Date of birth: 1986/04/09 Age: 26 y.o. Gender: female  Subjective: Overnight pt with sudden onset dyspnea at 3:50 AM accompanied by fever, tachycardia, tachypnea, and HTN; pulse ox > 95 %; patient placed on 2L and continuous pulse ox; lungs were clear to auscultation so index of suspicion low for pneumothorax and this was ruled out by CXR; ABG showed mildly elevated pH consistent with tachypnea; given ativan 1mg  for anxiety; elevated d-dimer so CTA ordered to assess PE even though no evidence of DVT on PE; CTA negative for PE but concerning for pulm edema   Objective: Vital signs in last 24 hours: Temp:  [99.8 F (37.7 C)-102.2 F (39 C)] 100.2 F (37.9 C) (05/30 0456) Pulse Rate:  [103-114] 110  (05/30 0456) Resp:  [18-22] 22  (05/30 0456) BP: (107-135)/(74-97) 114/81 mmHg (05/30 0456) SpO2:  [97 %-98 %] 97 % (05/30 0456) currently on 2L  Weight change:  Last BM Date: 10/03/11  Intake/Output from previous day: 05/29 0701 - 05/30 0700 In: [id number] [I.V.:1071; IV Piggyback:500] Out: 100 [Urine:100] Intake/Output this shift:    Constitutional: obese young WF, very pleasant and conversant,oriented x 4, Mild distress HENT:  Head: Normocephalic and atraumatic.  Neck: Normal range of motion. Neck supple.  Cardiovascular:  No murmur heard. Sinus tachycardia  Respiratory: Effort normal and breath sounds normal. She has no wheezes. She has no rales.  GI: Soft. Bowel sounds are normal. She exhibits no distension. There is tenderness in RUQ. There is no rebound and no guarding.  Mild tenderness on palpation of RLQ and LLQ.  Musculoskeletal: Normal range of motion.  Neurological: She is alert and oriented to person, place, and time.  No focal deficits.  Skin: No rash noted. No erythema.  pale    Lab Results:  Basename 10/04/11  0510 10/03/11 0540  WBC 9.7 12.0*  HGB 11.2* 11.5*  HCT 31.9* 33.5*  PLT 187 171   BMET  Basename 10/04/11 0510 10/03/11 0540  NA 135 131*  K 3.4* 3.1*  CL 101 98  CO2 19 21  GLUCOSE 225* 299*  BUN 13 11  CREATININE 0.75 0.77  CALCIUM 7.7* 7.9*    ABG    Component Value Date/Time   PHART 7.442* 10/04/2011 0444   PCO2ART 30.0* 10/04/2011 0444   PO2ART 74.5* 10/04/2011 0444   HCO3 20.1 10/04/2011 0444   TCO2 21.1 10/04/2011 0444   ACIDBASEDEF 3.3* 10/04/2011 0444   O2SAT 97.3 10/04/2011 0444      CBG (last 3)   Basename 10/04/11 0718 10/04/11 0358 10/03/11 2130  GLUCAP 220* 200* 226*   Lab Results  Component Value Date   HGBA1C 11.4* 10/02/2011    Studies/Results: Ct Angio Chest W/cm &/or Wo Cm  10/04/2011  *RADIOLOGY REPORT*  Clinical Data: Shortness of breath and elevated D-dimer.  CT ANGIOGRAPHY CHEST  Technique:  Multidetector CT imaging of the chest using the standard protocol during bolus administration of intravenous contrast. Multiplanar reconstructed images including MIPs were obtained and reviewed to evaluate the vascular anatomy.  Contrast: 179mL OMNIPAQUE IOHEXOL 350 MG/ML SOLN  Comparison: None.  Findings: There is no evidence of pulmonary embolism.  Lungs show patchy ground-glass opacities throughout all lobes and more focal areas of airspace opacification in the posterior lower lobes bilaterally.  There is a trace amount of left pleural fluid and a small right pleural effusion.  Findings  are most suggestive of developing pulmonary edema.  There is associated prominence of lymphoid tissue in both hilar regions.  Correlation suggested with any possibility of underlying chronic lung disease such as sarcoidosis given findings.  The heart size is normal.  No pericardial fluid present.  The thoracic aorta shows no aneurysmal disease.  No bony abnormalities are identified.  IMPRESSION:  1.  Evidence of probable developing pulmonary edema with trace to small bilateral  pleural effusions. 2.  Prominence of lymphoid tissue in bilateral hilar regions which may be reactive.  Correlation suggested with possibility of underlying lung disease such as sarcoidosis.  Original Report Authenticated By: Azzie Roup, M.D.   Dg Chest Portable 1 View  10/04/2011  *RADIOLOGY REPORT*  Clinical Data: Shortness of breath tonight.  PORTABLE CHEST - 1 VIEW  Comparison: 10/02/2011  Findings: Shallow inspiration.  Heart size and pulmonary vascularity are normal for inspiratory effort.  Vascular crowding in the lung bases.  No evidence of any focal airspace consolidation.  No blunting of costophrenic angles.  No pneumothorax.  No significant change since previous study.  IMPRESSION: No evidence of active pulmonary disease.  Shallow inspiration.  Original Report Authenticated By: Neale Burly, M.D.     Medications: I have reviewed the patient's current medications.  Assessment/Plan: 26 year old female with DM presents with presumed Urosepsis, abdominal pain and fever.   # Dyspnea - acute onset 5/30 in AM; CXR no acute cardiopulmonary dz or infiltrate; elevated D-dimer but CT-A negative for PE; CTA concerning for pulm edema but lungs initially clear on auscultation; ABG consistent with tachypnea; mildly improved with ativan 1 mg - KVO IVF in case pulm edema true cause  - consider lasix - wean O2 as saturation 97% on 2L   # E. Coli Sepsis: sensitive to all antibiotics except AMP. Started levofloxacin 5/28 - no clinical improvement considering patient with tachycardia and fever; consider alternative infxn/inflammatory source like gallbladder   # RUQ Pain: likely 2/2 pyelo, CT scan on 5/28 negative, but cholecystitis still possible  - improved s/p metoclopramide on 5/29, so may be related to underlying gastroparesis  - if she is having cholecystitis, this could explain persistent fever and some dyspnea, consider u/s today   # Diarrhea: resolved 5/29, c. Diff negative   #  DM2: Insulin drip in ED. Transitioned off insulin 5/28 since not acidotic; A1c 11.4 - total insulin requirement yesterday was 64 up from 57 units, so increase Lantus to 30 U from 25, continue sliding scale     # Hx H.pylori: No complaints of reflux of gastritis at this time.  - Protonix 40 IV daily   # Hypertension: BP within normal limits.  - Hold HCTZ for now, restart if BP starts to increase   # Gastroparesis: No home medications.  - Slowly advance diet   # Hyponatremia - likely 2/2 hypovolemia; resolved   # Hypokalemia - secondary to diarrhea and insulin - improving, replete again today   FEN/GI: carb modified diet, NS @  KVO PPx: Heparin SQ Dispo: pending clinical improvement         LOS: 3 days   Dewain Penning 10/04/2011, 9:42 AM

## 2011-10-04 NOTE — Progress Notes (Signed)
While rounding on unit, asked to see patient by charge RN. Patient woke complaining of cough and shortness of breath, HR and SBP stable, temp 102.2, no wheezes, crackles, or rhonchi. MD notified and at bedside, orders received for Chest xray and ABG. Will continue to monitor, advised bedside RN to call with further issues.

## 2011-10-04 NOTE — Progress Notes (Signed)
CRITICAL VALUE ALERT  Critical value received:  Blood culture, aerobic:  Positive for Gram-negative rods  Date of notification:  10/04/11  Time of notification:  22:22  Critical value read back:yes  Nurse who received alert:  Lenise Herald, RN  Dr. [name] notified of new result.  Discussed previous positive result from same date with Gram-negative rods.  Patient is currently ordered intravenous levofloxacin(levaquin).  No action needed at this time.  Continuing to closely monitor.

## 2011-10-04 NOTE — Plan of Care (Signed)
Problem: Phase III Progression Outcomes Goal: IV Medications to PO Continues on IV antibiotics, dose of IV lasix given today

## 2011-10-04 NOTE — Progress Notes (Signed)
Brief Progress Note [name]. [name], M.D., M.B.A  Family Medicine PGY-1 Pager [phone]  Patient name: Sheila Austin Medical record number: [id number] Date of birth: [birth year] Age: 26 y.o. Gender: female   S: Called to patients room b/c she was experiencing acute shortness of breath, tachycardia, and increased fever; per patient symptoms started suddenlly and felt like couldn't breath on right ahdn side; accompanied by nausea and fear  O: BP 135/97  Pulse 114  Temp(Src) 102.2 F (39 C) (Oral)  Resp 21  Ht 5\' 2"  (1.575 m)  Wt 274 lb (124.286 kg)  BMI 50.12 kg/m2  SpO2 97%  LMP 08/01/2011 - pt on 2L O2 Gen: young obese female, sitting on bedside, dyspneic and coughing Cardiac: sinus tachycardia Pulm: CTA in all lobes, no wheezes, rales or rhonchi LE: no tenderness or unilateral swelling  A/P: 26 year old patient with E. Coli urosepsis who has acute onset dyspnea with tachycardia and increasing fever.  DDX includes PE, pneumothorax, HCAP with ARDS, panic attack - order stat CXR & ABG - continue O2 and place on continuous pulse ox  - consider CTA to r/o PE - call resp tx and CCM is becomes hypoxemic or very distress  - if all else ruled out then 1 mg alprazolam for anxiety  E.V. [name], MD

## 2011-10-05 ENCOUNTER — Encounter (HOSPITAL_COMMUNITY): Payer: Self-pay | Admitting: *Deleted

## 2011-10-05 ENCOUNTER — Inpatient Hospital Stay (HOSPITAL_COMMUNITY): Payer: 59

## 2011-10-05 DIAGNOSIS — J811 Chronic pulmonary edema: Secondary | ICD-10-CM

## 2011-10-05 DIAGNOSIS — G4733 Obstructive sleep apnea (adult) (pediatric): Secondary | ICD-10-CM

## 2011-10-05 DIAGNOSIS — R0902 Hypoxemia: Secondary | ICD-10-CM | POA: Diagnosis not present

## 2011-10-05 DIAGNOSIS — A4151 Sepsis due to Escherichia coli [E. coli]: Secondary | ICD-10-CM | POA: Diagnosis present

## 2011-10-05 DIAGNOSIS — Z6841 Body Mass Index (BMI) 40.0 and over, adult: Secondary | ICD-10-CM

## 2011-10-05 DIAGNOSIS — Z9981 Dependence on supplemental oxygen: Secondary | ICD-10-CM | POA: Diagnosis not present

## 2011-10-05 LAB — GLUCOSE, CAPILLARY
Glucose-Capillary: 177 mg/dL — ABNORMAL HIGH (ref 70–99)
Glucose-Capillary: 187 mg/dL — ABNORMAL HIGH (ref 70–99)
Glucose-Capillary: 171 mg/dL — ABNORMAL HIGH (ref 70–99)

## 2011-10-05 LAB — PRO B NATRIURETIC PEPTIDE: Pro B Natriuretic peptide (BNP): 763.2 pg/mL — ABNORMAL HIGH (ref 0–125)

## 2011-10-05 LAB — BASIC METABOLIC PANEL
BUN: 11 mg/dL (ref 6–23)
CO2: 25 mEq/L (ref 19–32)
Chloride: 95 mEq/L — ABNORMAL LOW (ref 96–112)
Creatinine, Ser: 0.84 mg/dL (ref 0.50–1.10)
Glucose, Bld: 221 mg/dL — ABNORMAL HIGH (ref 70–99)
Potassium: 2.9 mEq/L — ABNORMAL LOW (ref 3.5–5.1)

## 2011-10-05 LAB — CULTURE, BLOOD (ROUTINE X 2)

## 2011-10-05 MED ORDER — FUROSEMIDE 10 MG/ML IJ SOLN
80.0000 mg | Freq: Once | INTRAMUSCULAR | Status: AC
  Administered 2011-10-05: 80 mg via INTRAVENOUS
  Filled 2011-10-05: qty 8

## 2011-10-05 MED ORDER — POTASSIUM CHLORIDE CRYS ER 20 MEQ PO TBCR
20.0000 meq | EXTENDED_RELEASE_TABLET | Freq: Four times a day (QID) | ORAL | Status: DC
  Administered 2011-10-05 (×4): 20 meq via ORAL
  Filled 2011-10-05 (×8): qty 1

## 2011-10-05 MED ORDER — FUROSEMIDE 40 MG PO TABS
40.0000 mg | ORAL_TABLET | Freq: Two times a day (BID) | ORAL | Status: AC
Start: 2011-10-05 — End: 2011-10-05
  Administered 2011-10-05 (×2): 40 mg via ORAL
  Filled 2011-10-05 (×3): qty 1

## 2011-10-05 MED ORDER — LEVOFLOXACIN 500 MG PO TABS
500.0000 mg | ORAL_TABLET | Freq: Every day | ORAL | Status: DC
  Administered 2011-10-05 – 2011-10-06 (×2): 500 mg via ORAL
  Filled 2011-10-05 (×2): qty 1

## 2011-10-05 NOTE — Progress Notes (Signed)
Pt followed for progression of care. She has been evaluated for and will receive information on the Link to [employer] as a benefit of [employer] insurance. The program is for DM, HTN and Hyperlipidemia. Pt will receive one-on-one counseling from outpatient Nutritional and DM management center, [place], outpatient pharmacy and attend classes with a goal to improve DM and other medical issues. She will receive a post d/c transition of care call and an appointment will be set up for the Link to Wellness program. Kingsley Callander C. Katalaya Beel, RN, CCM, Scientist, research (life sciences), [place] Management program.

## 2011-10-05 NOTE — Progress Notes (Signed)
Seen and examined.  Discussed in rounds.  Patient with two nocturnal episodes of tachycardia and desaturation.  Otherwise improving from her urosepsis.  She does snore loudly at night and has a positive FHX of OSA.  Also, gm neg sepsis with endotoxin can cause acute lung injury.  We have consulted pulm to make sure we are not missing a treatable problem.  Plan to DC when she is able to get through the night without decompensating.

## 2011-10-05 NOTE — Discharge Summary (Signed)
Physician Discharge Summary  Patient ID: Sheila Austin MRN: [id number] DOB/AGE: Jul 18, 1985 26 y.o.  Admit date: 10/01/2011 Discharge date: 10/06/2011 Admission Diagnoses: Sepsis; Hyperglycemia   Discharge Diagnoses:  Principal Problem:  *E. coli sepsis Active Problems:  Morbid obesity with BMI of 50.0-59.9, adult  Pulmonary edema, noncardiac  Hypoxemia requiring supplemental oxygen  DM (diabetes mellitus), type 2, uncontrolled  Obstructive sleep apnea   Discharged Condition: good  Hospital Course:   Sheila Austin is a 26 year old morbidly obese patient with uncontrolled diabetes who presented with E. Coli urosepsis and developed respiratory distress while in the hospital.   E. Coli Urosepsis: The patient presented with fever, elevated WBC, tachycardia and evidence of a UTI. Her urine and blood cultures were positive for E. Coli. She was treated for E. Coli sepsis with levofloxacin for 5 days in the hospital. She required 5 more days of outpatient antibiotics to complete a 14 days course.   Respiratory Distress: The patient developed acute respiratory distress on 5/30. It was characterized by tachycardia, tachypnea, fever, dyspnea, and hypoxemia. A chest x-ray did not show any acute disease. An ABG showed mild respiratory acidosis and hypoxemia. A d dimer was > 4, but CTA of the chest was negative for blood clots. However, the CTA did show some pulmonary edema. Therefore, the patient was treated with 80 mg of IV lasix. This did help the symptoms, but the patient was still hypoxemic throughout the days. A pro BNP was elevated to 499, so an echocardiogram was checked to rule out cardiogenic ARDS. Fortunately, it showed normal systolic function and no valvular abnormalities. On 5/31, at 2:00 AM the patient developed an increased O2 requirement with tachypnea. A chest x-ray showed vascular congestion, so she was diuresed again. Once again, this improved her oxygenation. A pulmonology  consultation was called given that we ruled out PE and cardiogenic causes of the ARDS. The consulting pulmonologist believes that the events were related to fluid overload and possible OSA. Prior to discharge, she was able to ambulated without desaturation, so she was not discharge on oxygen.   Diabetes: The patient was not taking her metformin and her Hgb A1C was 11.4. Upon admssion, her CBG was > 415. She was treated with fluids and insulin. She required 30 units of Lantus to go along with sliding scale novolog. She was discharged on  Lantus and Metformin and will need outpatient diabetes classes and case management.   Hypokalemia: This was multifactorial including urinary losses from hyperglycemia and then diuresis with furosemide. It was repleted daily.   Consults: pulmonary/intensive care  Significant Diagnostic Studies:   Chest X-ray 5/31: Mild increase of heart size and pulmonary vascularity suggesting congestion  D. Dimer 4.43  CTA Chest 5/30: 1. Evidence of probable developing pulmonary edema with trace to small bilateral pleural effusions.  2. Prominence of lymphoid tissue in bilateral hilar regions which may be reactive. Correlation suggested with possibility of  underlying lung disease such as sarcoidosis.  CT Abdomen and Pelvis 5/28: Diffuse fatty infiltration of the liver. No focal acute process  demonstrated in the abdomen or pelvis.  Pro BNP: 499 --> 763  Transthoracic Echocardiogram 5/30:  - Left ventricle: The cavity size was normal. Wall thicknesswas normal. Systolic function was normal. The estimated ejection fraction was 50%, in the range of 50% to 55%. Regional wall motion abnormalities cannot be excluded. - Mitral valve: Mild regurgitation. - Left atrium: The atrium was mildly dilated. - Pericardium, extracardiac: A trivial pericardial effusion was identified.  Urine Culture: E. Coli sensitive to all agents except ampicillin Blood Culture: E. Coli sensitive to  all agents except ampicillin GC/Chalmydia: negative  C. Diff: negative    Discharge Exam: Blood pressure 94/64, pulse 84, temperature 97.9 F (36.6 C), temperature source Oral, resp. rate 13, height 5\' 2"  (1.575 m), weight 274 lb (124.286 kg), last menstrual period 10/03/2011, SpO2 100.00%. Constitutional: obese young WF, very pleasant and conversant,oriented x 4, Mild distress and anxious  Neck: Normal range of motion. Neck supple.  Cardiovascular: No murmur heard. Sinus tachycardia  Respiratory: CTAB today with good air movement.  GI: Soft. Bowel sounds are normal. NT, ND  Neurological: She is alert and oriented to person, place, and time. No focal deficits.  Skin: No rash noted. No erythema.    Disposition: 01-Home or Self Care  Discharge Orders    Future Appointments: Provider: Department: Dept Phone: Center:   10/22/2011 9:30 AM Katherina Mires, MD [facility] Res[facility] [phone] [facility]     Future Orders Please Complete By Expires   Discharge patient        Medication List  As of 10/08/2011  3:45 PM   TAKE these medications         esomeprazole 40 MG capsule   Commonly known as: NEXIUM   Take 40 mg by mouth daily before breakfast.      insulin aspart 100 UNIT/ML injection   Commonly known as: novoLOG   Inject 3 Units into the skin 3 (three) times daily before meals.      insulin glargine 100 UNIT/ML injection   Commonly known as: LANTUS   Inject 30 Units into the skin at bedtime.      levofloxacin 500 MG tablet   Commonly known as: LEVAQUIN   Take 1 tablet (500 mg total) by mouth daily.      metFORMIN 500 MG tablet   Commonly known as: GLUCOPHAGE   Take 500 mg by mouth 2 (two) times daily with a meal.      potassium chloride SA 20 MEQ tablet   Commonly known as: K-DUR,KLOR-CON   Take 1 tablet (20 mEq total) by mouth daily.      promethazine 12.5 MG tablet   Commonly known as: PHENERGAN   Take 1 tablet (12.5 mg total) by mouth every 8 (eight) hours as needed for  nausea.           Follow-up Information    Follow up with Suzanna Obey, MD. Call in 2 days. (need appointment in 1-2 weeks)    Contact information:   [place] [phone]        Follow Up Issues: 1. Diabetes Management - Patient needs lots of education and would benefit from formal classes and the help of [facility] case management.  2. Weight Loss 3. Consider screening for OSA, as this may contributed to her respiratory distress while hospitalized   Signed: Dewain Penning 10/08/2011, 3:45 PM

## 2011-10-05 NOTE — Progress Notes (Signed)
CSW assisted with pt updating Admission office with correct insurance information. Pt gave CSW permission to fax insurance cards to admission office. Joelene Millin confirmed pt insurance information was received.   .No further Clinical Social Work needs, signing off.   Dorathy Kinsman, [place] .10/05/2011 1030am

## 2011-10-05 NOTE — Progress Notes (Addendum)
Inpatient Diabetes Program Recommendations  AACE/ADA: New Consensus Statement on Inpatient Glycemic Control (2009)  Target Ranges:  Prepandial:   less than 140 mg/dL      Peak postprandial:   less than 180 mg/dL (1-2 hours)      Critically ill patients:  140 - 180 mg/dL   Results for CORTASIA, KHOURI (MRN [id number]) as of 10/05/2011 11:36  Ref. Range 10/04/2011 03:58 10/04/2011 07:18 10/04/2011 11:20 10/04/2011 16:46 10/05/2011 08:22  Glucose-Capillary Latest Range: 70-99 mg/dL 200 (H) 220 (H) 226 (H) 184 (H) 242 (H)   Recommend increase Lantus to 36 units.    Thank you  Raoul Pitch RN,BSN,CDE Inpatient Diabetes Coordinator [phone]  Add: Diabetes coordinator visited patient and mother to educate on insulin pen use.  The patient has used the Lantus Solostar pen in the past.  The patient demonstrated the correct steps for using the pen with ease.  She is concerned about how she will get her insulin because there is a question about whether her insurance is active or not?  She is a patient at [facility] and possibly they could assist her?   The patient did not have any further questions or concerns at this time.    Thank you  Raoul Pitch [facility] Inpatient Diabetes Coordinator [phone]

## 2011-10-05 NOTE — Progress Notes (Signed)
ANTIBIOTIC CONSULT NOTE - FOLLOW UP  Pharmacy Consult for Levaquin Indication: E. coli pyelo and bacteremia  Allergies  Allergen Reactions  . Penicillins Anaphylaxis    Patient Measurements: Height: 5\' 2"  ([id number] cm) Weight: 274 lb (124.286 kg) IBW/kg (Calculated) : 50.1   Vital Signs: Temp: 97.3 F (36.3 C) (05/31 0549) Temp src: Axillary (05/31 0549) BP: 106/76 mmHg (05/31 0549) Pulse Rate: 100  (05/31 0549) Intake/Output from previous day: 05/30 0701 - 05/31 0700 In: 583.3 [I.V.:283.3; IV Piggyback:300] Out: [id number] [Urine:4275] Intake/Output from this shift:    Labs:  Basename 10/05/11 0540 10/04/11 0510 10/03/11 0540 10/02/11 0859  WBC -- 9.7 12.0* 14.9*  HGB -- 11.2* 11.5* 12.7  PLT -- 187 171 179  LABCREA -- -- -- --  CREATININE 0.84 0.75 0.77 --   Estimated Creatinine Clearance: 131.2 ml/min (by C-G formula based on Cr of 0.84). No results found for this basename: VANCOTROUGH:2,VANCOPEAK:2,VANCORANDOM:2,GENTTROUGH:2,GENTPEAK:2,GENTRANDOM:2,TOBRATROUGH:2,TOBRAPEAK:2,TOBRARND:2,AMIKACINPEAK:2,AMIKACINTROU:2,AMIKACIN:2, in the last 72 hours   Microbiology: Recent Results (from the past 720 hour(s))  URINE CULTURE     Status: Normal   Collection Time   10/02/11 12:18 AM      Component Value Range Status Comment   Specimen Description URINE, CATHETERIZED   Final    Special Requests NONE   Final    Culture  Setup Time [id number]   Final    Colony Count >=100,000 COLONIES/ML   Final    Culture ESCHERICHIA COLI   Final    Report Status 10/03/2011 FINAL   Final    Organism ID, Bacteria ESCHERICHIA COLI   Final   CULTURE, BLOOD (ROUTINE X 2)     Status: Normal   Collection Time   10/02/11 12:50 AM      Component Value Range Status Comment   Specimen Description BLOOD LEFT HAND   Final    Special Requests BOTTLES DRAWN AEROBIC ONLY 10CC   Final    Culture  Setup Time [id number]   Final    Culture     Final    Value: ESCHERICHIA COLI     Note: Gram Stain Report  Called to,Read Back By and Verified With: JALESA MILAM @ 0012 ON 10/03/11 BY GOLLD   Report Status 10/05/2011 FINAL   Final    Organism ID, Bacteria ESCHERICHIA COLI   Final   CULTURE, BLOOD (ROUTINE X 2)     Status: Normal (Preliminary result)   Collection Time   10/02/11  1:00 AM      Component Value Range Status Comment   Specimen Description BLOOD RIGHT HAND   Final    Special Requests BOTTLES DRAWN AEROBIC ONLY 10CC   Final    Culture  Setup Time [id number]   Final    Culture     Final    Value: GRAM NEGATIVE RODS     Note: Gram Stain Report Called to,Read Back By and Verified With: CHRIS RALPH @ 2222 ON 10/04/11 BY GOLLD   Report Status PENDING   Incomplete   CLOSTRIDIUM DIFFICILE BY PCR     Status: Normal   Collection Time   10/02/11  5:46 PM      Component Value Range Status Comment   C difficile by pcr NEGATIVE  NEGATIVE  Final     Anti-infectives     Start     Dose/Rate Route Frequency Ordered Stop   10/05/11 1000   levofloxacin (LEVAQUIN) tablet 500 mg        500 mg Oral Daily 10/05/11 0724  10/02/11 1100   levofloxacin (LEVAQUIN) IVPB 500 mg  Status:  Discontinued        500 mg 100 mL/hr over 60 Minutes Intravenous Every 24 hours 10/02/11 0944 10/05/11 0724   10/02/11 1000   cefTRIAXone (ROCEPHIN) 1 g in dextrose 5 % 50 mL IVPB  Status:  Discontinued        1 g 100 mL/hr over 30 Minutes Intravenous Every 24 hours 10/02/11 0825 10/02/11 0847   10/02/11 0315   piperacillin-tazobactam (ZOSYN) IVPB 3.375 g  Status:  Discontinued        3.375 g 12.5 mL/hr over 240 Minutes Intravenous  Once 10/02/11 0314 10/02/11 [id number]          Assessment: 26 yo female on day 4 Levaquin for E. Coli pyelo and bacteremia. Renal function is stable. Levaquin is appropriate therapy and is sensitive to infection.  Plan:  Continue Levaquin 500 mg po daily for 10 - 14 days Pharmacy signing off - please re-consult if needed  [facility], [place].D., BCPS Clinical  Pharmacist 10/05/2011 8:47 AM

## 2011-10-05 NOTE — Progress Notes (Signed)
Daily Progress Note Sheila Austin. Sheila Austin, M.D., M.B.A  Family Medicine PGY-1 Pager [phone]  Patient name: Sheila Austin Medical record number: [id number] Date of birth: 1985-06-04 Age: 26 y.o. Gender: female  Subjective:  O/N events - increased O2 requirement from 2L via n/c to 4L at 35% via venturi mask; associated with tachypnea into the 30's but no subjective dyspnea or chest pain; Rales present on exam so patient tx'd with furosemide 80 mg IV x 1; O2 requirement decreased over course of next 4 hours to 3L via n/c   Objective: Vital signs in last 24 hours: Temp:  [97.3 F (36.3 C)-99.5 F (37.5 C)] 97.3 F (36.3 C) (05/31 0549) Pulse Rate:  [100-111] 100  (05/31 0549) Resp:  [20-34] 22  (05/31 0625) BP: (106-145)/(76-90) 106/76 mmHg (05/31 0549) SpO2:  [83 %-100 %] 98 % (05/31 0625) FiO2 (%):  [24 %-35 %] 24 % (05/31 0618) Weight change:  Last BM Date: 10/04/11  Intake/Output from previous day: 05/30 0701 - 05/31 0700 In: 583.3 [I.V.:283.3; IV Piggyback:300] Out: [id number] [Urine:4275] Intake/Output this shift: Total I/O In: -  Out: 1625 [Urine:1625]  Constitutional: obese young WF, very pleasant and conversant,oriented x 4, Mild distress   Head: Normocephalic and atraumatic.  Neck: Normal range of motion. Neck supple.  Cardiovascular: No murmur heard. Sinus tachycardia  Respiratory: Mildly increased WOB, slight rales in lower lobes bilat GI: Soft. Bowel sounds are normal. She exhibits no distension. Decreased tenderness in RUQ. There is no rebound and no guarding.  Musculoskeletal: Normal range of motion.  Neurological: She is alert and oriented to person, place, and time. No focal deficits.  Skin: No rash noted. No erythema.   Lab Results:  Basename 10/04/11 0510 10/03/11 0540  WBC 9.7 12.0*  HGB 11.2* 11.5*  HCT 31.9* 33.5*  PLT 187 171   BMET  Basename 10/04/11 0510 10/03/11 0540  NA 135 131*  K 3.4* 3.1*  CL 101 98  CO2 19 21  GLUCOSE 225* 299*  BUN 13  11  CREATININE 0.75 0.77  CALCIUM 7.7* 7.9*    Studies/Results: Dg Chest 2 View  10/05/2011  *RADIOLOGY REPORT*  Clinical Data: Shortness of breath with increased oxygen requirement.  CHEST - 2 VIEW  Comparison: 10/04/2011  Findings: Shallow inspiration.  Mild enlarging heart size and pulmonary vascularity suggesting developing congestion.  No focal airspace consolidation.  No blunting of costophrenic angles.  No pneumothorax.  Bilateral cervical ribs.  IMPRESSION: Mild increase of heart size and pulmonary vascularity suggesting congestion.  Original Report Authenticated By: Neale Burly, M.D.   Ct Angio Chest W/cm &/or Wo Cm  10/04/2011  *RADIOLOGY REPORT*  Clinical Data: Shortness of breath and elevated D-dimer.  CT ANGIOGRAPHY CHEST  Technique:  Multidetector CT imaging of the chest using the standard protocol during bolus administration of intravenous contrast. Multiplanar reconstructed images including MIPs were obtained and reviewed to evaluate the vascular anatomy.  Contrast: 145mL OMNIPAQUE IOHEXOL 350 MG/ML SOLN  Comparison: None.  Findings: There is no evidence of pulmonary embolism.  Lungs show patchy ground-glass opacities throughout all lobes and more focal areas of airspace opacification in the posterior lower lobes bilaterally.  There is a trace amount of left pleural fluid and a small right pleural effusion.  Findings are most suggestive of developing pulmonary edema.  There is associated prominence of lymphoid tissue in both hilar regions.  Correlation suggested with any possibility of underlying chronic lung disease such as sarcoidosis given findings.  The heart size is normal.  No pericardial fluid present.  The thoracic aorta shows no aneurysmal disease.  No bony abnormalities are identified.  IMPRESSION:  1.  Evidence of probable developing pulmonary edema with trace to small bilateral pleural effusions. 2.  Prominence of lymphoid tissue in bilateral hilar regions which may be  reactive.  Correlation suggested with possibility of underlying lung disease such as sarcoidosis.  Original Report Authenticated By: Azzie Roup, M.D.   Dg Chest Portable 1 View  10/04/2011  *RADIOLOGY REPORT*  Clinical Data: Shortness of breath tonight.  PORTABLE CHEST - 1 VIEW  Comparison: 10/02/2011  Findings: Shallow inspiration.  Heart size and pulmonary vascularity are normal for inspiratory effort.  Vascular crowding in the lung bases.  No evidence of any focal airspace consolidation.  No blunting of costophrenic angles.  No pneumothorax.  No significant change since previous study.  IMPRESSION: No evidence of active pulmonary disease.  Shallow inspiration.  Original Report Authenticated By: Neale Burly, M.D.    Medications: I have reviewed the patient's current medications.  Assessment/Plan: 26 year old female with DM presents with presumed Urosepsis, abdominal pain and fever.   # Dyspnea -  > acute onset 5/30 in AM; CXR no acute cardiopulmonary dz or infiltrate; elevated D-dimer but CT-A negative for PE; CTA concerning for pulm edema but lungs initially clear on auscultation; ABG consistent with tachypnea; mildly improved with ativan 1 mg; given 80 mg x 1 > Increased O2 requirements 5/31, need 4L 35% on mask, CXR showed vasc congestion; Given lasix 80 mg x 1  - Given pulm vascular congestion and increasing BNP, I favor cardiomyopathy but don't know the source - Awaiting echo results - Continue Lasix 40 mg OP BID just for today    # E. Coli Sepsis: sensitive to all antibiotics except AMP. Started levofloxacin 5/28  - switch to PO antibiotics since afebrile > 24 hours   # RUQ Pain: likely 2/2 pyelo, CT scan on 5/28 negative, but cholecystitis still possible  - improved s/p metoclopramide on 5/29, so may be related to underlying gastroparesis  - if she is having cholecystitis, this could explain persistent fever and some dyspnea, consider u/s today   # Diarrhea: resolved  5/29, c. Diff negative   # DM2: Insulin drip in ED. Transitioned off insulin 5/28 since not acidotic; A1c 11.4  - continue Lantus to 30 U,continue sliding scale   # Hx H.pylori: No complaints of reflux of gastritis at this time.  - Protonix 40 IV daily   # Hypertension: BP within normal limits.  - Hold HCTZ for now, restart if BP starts to increase   # Gastroparesis: No home medications.  - Slowly advance diet   # Hyponatremia - likely 2/2 hypovolemia; resolved   # Hypokalemia - secondary to diarrhea and insulin  -  Worsening 2/2 lasix; replete again today   FEN/GI: carb modified diet, NS @ KVO  PPx: Heparin SQ Dispo: pending clinical improvement      LOS: 4 days   Dewain Penning 10/05/2011, 6:53 AM

## 2011-10-05 NOTE — Consult Note (Signed)
Name: Sheila Austin MRN: [id number] DOB: 1985-10-11    LOS: 4  Referring Provider:  Teaching Service Reason for Referral:  Hypoxia  PULMONARY / CRITICAL CARE MEDICINE  HPI:  26 yo MO (274 lbs) admitted on 5/27 with fever and UTI with sepsis. Improved at 48 houre but noted waking up SOB 5/30 and 31. She is a prime canditate for OSA and her mother (sleeps in same bed) reports snoring and apnea). Currently 5/31 on O2 at 2 l/m. Denies Lower Ext pain. On hep for DVT protection. Never smoke. Off pill control pills for over a year. BNP 763, 2 d with EF 50% + MVR, CT chest -PE but with lower lobes opacification. PCCM asked to evaluate.  Past Medical History  Diagnosis Date  . H. pylori infection   . GERD (gastroesophageal reflux disease)   . Diabetes mellitus   . Morbid obesity with BMI of 50.0-59.9, adult 10/05/2011   Past Surgical History  Procedure Date  . No past surgeries    Prior to Admission medications   Medication Sig Start Date End Date Taking? Authorizing Provider  esomeprazole (NEXIUM) 40 MG capsule Take 40 mg by mouth daily before breakfast.   Yes Historical Provider, MD  metFORMIN (GLUCOPHAGE) 500 MG tablet Take 500 mg by mouth 2 (two) times daily with a meal.   Yes Historical Provider, MD   Allergies Allergies  Allergen Reactions  . Penicillins Anaphylaxis    Family History Family History  Problem Relation Age of Onset  . Heart failure Maternal Aunt   . Heart failure Other    Social History  reports that she has never smoked. She has never used smokeless tobacco. She reports that she does not drink alcohol or use illicit drugs.  Review Of Systems:  Taken extensively see hpi  Brief patient description:  [name] NAD @ rest  Events Since Admission: 2 nocturnal episodes of waking up SOB.  Current Status: NAD Vital Signs: Temp:  [97.3 F (36.3 C)-99.5 F (37.5 C)] 97.3 F (36.3 C) (05/31 0549) Pulse Rate:  [100-111] 100  (05/31 0549) Resp:  [20-34] 22  (05/31  0625) BP: (106-145)/(76-90) 106/76 mmHg (05/31 0549) SpO2:  [83 %-100 %] 98 % (05/31 0625) FiO2 (%):  [24 %-35 %] 24 % (05/31 0618)  Physical Examination: General: [name] NAD @ rest Neuro:  intact HEENT:  No neck Neck:  No jvd Cardiovascular:  hsr rrr Lungs:  Decreased in bases Abdomen:  Obese + bs Musculoskeletal:  Intact, no le tnederness Skin:  warm  Principal Problem:  *E. coli sepsis Active Problems:  Morbid obesity with BMI of 50.0-59.9, adult  Pulmonary edema, noncardiac  Hypoxemia requiring supplemental oxygen  DM (diabetes mellitus), type 2, uncontrolled   ASSESSMENT AND PLAN  PULMONARY  Lab 10/04/11 0444  PHART 7.442*  PCO2ART 30.0*  PO2ART 74.5*  HCO3 20.1  O2SAT 97.3   Ventilator Settings: Vent Mode:  [-]  FiO2 (%):  [24 %-35 %] 24 % CXR:   Dg Chest 2 View  10/05/2011  *RADIOLOGY REPORT*  Clinical Data: Shortness of breath with increased oxygen requirement.  CHEST - 2 VIEW  Comparison: 10/04/2011  Findings: Shallow inspiration.  Mild enlarging heart size and pulmonary vascularity suggesting developing congestion.  No focal airspace consolidation.  No blunting of costophrenic angles.  No pneumothorax.  Bilateral cervical ribs.  IMPRESSION: Mild increase of heart size and pulmonary vascularity suggesting congestion.  Original Report Authenticated By: Neale Burly, M.D.   Ct Angio Chest W/cm &/  or Wo Cm  10/04/2011  *RADIOLOGY REPORT*  Clinical Data: Shortness of breath and elevated D-dimer.  CT ANGIOGRAPHY CHEST  Technique:  Multidetector CT imaging of the chest using the standard protocol during bolus administration of intravenous contrast. Multiplanar reconstructed images including MIPs were obtained and reviewed to evaluate the vascular anatomy.  Contrast: 155mL OMNIPAQUE IOHEXOL 350 MG/ML SOLN  Comparison: None.  Findings: There is no evidence of pulmonary embolism.  Lungs show patchy ground-glass opacities throughout all lobes and more focal areas of  airspace opacification in the posterior lower lobes bilaterally.  There is a trace amount of left pleural fluid and a small right pleural effusion.  Findings are most suggestive of developing pulmonary edema.  There is associated prominence of lymphoid tissue in both hilar regions.  Correlation suggested with any possibility of underlying chronic lung disease such as sarcoidosis given findings.  The heart size is normal.  No pericardial fluid present.  The thoracic aorta shows no aneurysmal disease.  No bony abnormalities are identified.  IMPRESSION:  1.  Evidence of probable developing pulmonary edema with trace to small bilateral pleural effusions. 2.  Prominence of lymphoid tissue in bilateral hilar regions which may be reactive.  Correlation suggested with possibility of underlying lung disease such as sarcoidosis.  Original Report Authenticated By: Azzie Roup, M.D.   Dg Chest Portable 1 View  10/04/2011  *RADIOLOGY REPORT*  Clinical Data: Shortness of breath tonight.  PORTABLE CHEST - 1 VIEW  Comparison: 10/02/2011  Findings: Shallow inspiration.  Heart size and pulmonary vascularity are normal for inspiratory effort.  Vascular crowding in the lung bases.  No evidence of any focal airspace consolidation.  No blunting of costophrenic angles.  No pneumothorax.  No significant change since previous study.  IMPRESSION: No evidence of active pulmonary disease.  Shallow inspiration.  Original Report Authenticated By: Neale Burly, M.D.    ETT:  none  A:  Hypoemia with nocturnal exacerbations. Maybe be early ALI. P:   -Consider sleep sub in future -diuresis as tolerated -O2 as needed -consider nocturnal autoset cpap, but would need tx to unit. May not be a bad idea since she has had recent sepsis event.  CARDIOVASCULAR  Lab 10/05/11 0540 10/04/11 1120 10/02/11 0041  TROPONINI -- -- --  LATICACIDVEN -- -- 1.9  PROBNP 763.2* 499.6* --   ECG:   Lines: none  A: volume overload P:    -diuresis  RENAL  Lab 10/05/11 0540 10/04/11 0510 10/03/11 0540 10/02/11 0859 10/02/11 0041  NA 136 135 131* -- 128*  K 2.9* 3.4* -- -- --  CL 95* 101 98 -- 92*  CO2 25 19 21  -- 22  BUN 11 13 11  -- 10  CREATININE 0.84 0.75 0.77 0.69 0.66  CALCIUM 8.6 7.7* 7.9* -- 8.5  MG -- -- -- -- --  PHOS -- -- -- -- --   Intake/Output      05/30 0701 - 05/31 0700 05/31 0701 - 06/01 0700   I.V. (mL/kg) 283.3 (2.3)    IV Piggyback 300    Total Intake(mL/kg) 583.3 (4.7)    Urine (mL/kg/hr) 4275 (1.4)    Total Output 4275    Net -3691.7          Foley:  none  A:  No issue P:     GASTROINTESTINAL  Lab 10/02/11 0041  AST 14  ALT 11  ALKPHOS 66  BILITOT 0.6  PROT 7.2  ALBUMIN 2.8*    A:  Morbid  obesity P:   -wt loss  HEMATOLOGIC  Lab 10/04/11 0510 10/03/11 0540 10/02/11 0859 10/02/11 0041  HGB 11.2* 11.5* 12.7 13.2  HCT 31.9* 33.5* 36.1 37.1  PLT 187 171 179 198  INR -- -- -- 1.12  APTT -- -- -- 29   A:  No issue P:    INFECTIOUS  Lab 10/04/11 0510 10/03/11 0540 10/02/11 0859 10/02/11 0105 10/02/11 0041  WBC 9.7 12.0* 14.9* -- 15.0*  PROCALCITON -- -- -- 2.98 --   Cultures: E coli urine/blood Antibiotics:   A: bacteremia recent sepsis  P:   -cont abx  ENDOCRINE  Lab 10/05/11 0822 10/04/11 1646 10/04/11 1120 10/04/11 0718 10/04/11 0358  GLUCAP 242* 184* 226* 220* 200*   A:  DM   P:   Per IM  NEUROLOGIC  A:  Intact P:     BEST PRACTICE / DISPOSITION Level of Care:  floor Primary Service:  Teaching service Consultants:  pccm 5/31 Code Status:  full Diet:  regular DVT Px:  Hep sq GI Px:  ppi Skin Integrity:  intact Social / Family:  Updated mother at bedside 5/31  [facility] Minor ACNP Maryanna Shape PCCM Pager [phone] till 3 pm If no answer page [phone] 10/05/2011, 11:24 AM  Pt examined and database reviewed. I agree with above findings, assessment and plan as reflected in the note. She has mild pulmonary infiltrates that likely represent  edema secondary to volume resuscitation or mild acute lung injury related to UTI induced sepsis. She is markedly better after diuresis. At this point, I would discontinue further diuresis and allow her to recovery on her own (as I feel certain that she will).  In addition, she provides a history that is very compelling for OSA. This is a long-standing and nonurgent problem but warrants evaluation with outpt polysomnogram after she fully recovers from her current acute illness. I discussed wt loss with her  PCCM will sign off. Please call if we can be of further assistance  Merton Border, MD;  PCCM service; Mobile [phone]

## 2011-10-05 NOTE — Progress Notes (Signed)
Brief Progress Note [name]. [name], M.D., M.B.A  Family Medicine PGY-1 Pager [phone]  Patient name: Sheila Austin Medical record number: [id number] Date of birth: Nov 05, 1985 Age: 26 y.o. Gender: female   S: Called to patient's bedside by RN, Mariel Sleet, 2/2 to decreasing O2 saturation to 80s on 2L via n/c and tachypnea. When I arrived at patient's bedside, RN had already alertly placed a venturi mask on the patient. She was receiving 4L of 35% FIO2 and satting 96%. She was sleeping, but easily awoken. Sheila Austin denied chest pain or the sensation of shortness of breath similar to last night.   O: Vitals:RR: 25, Sat 96% on 4L, 35% O2 on venturi mask Gen: obese WF, lying in bed, non-distressed Pulm: diffuse rales in all lungs fields, good air movment, not wheezes or rhonchi  A/P: Sheila Austin is a 26 y.o F who presented with E. Coli urosepsis that had acute respiratory distress last night from pulmonary edema. She required O2 since that time and now has an increase requirement. Fortunately, she is much more well appearing than she was last night. I believe that this is a recurrence of the pulm edema. I don't know why it is recurrent, as she has been off of IVF all day. She is s/p 80 mg furosemide today that helped with diuresing 2.6 L. Index of suspicion very low for other causes of hypoxemia such as PNA since afebrile or PE since CT-A clear < 24 hours ago. we are still waiting on an echocardiogram.  - continue venturi mask for now and wean since clinically improved - stat 2 view chest X-ray - 80 mg furosemide IV after CXR - consider movement to step down if need for venturi mask > 4 hours  Jeralene Peters, MD 2:22 AM, 10/05/11

## 2011-10-05 NOTE — Progress Notes (Signed)
Pt followed for progression of care. She has been evaluated and will be given information on participating in the Link to Wellness program for [employer] plan members. The program is for those with DM, HTN and Hyperlipidemia. The pt will receive education and one-on-one counseling from the [facility], [facility] and [facility] in an attempt to improve DM and other medical issues. Pt will also receive a post d/c transition of care call and an appointment will be set up for further follow up.

## 2011-10-06 LAB — CBC
HCT: 34.9 % — ABNORMAL LOW (ref 36.0–46.0)
MCHC: 34.7 g/dL (ref 30.0–36.0)
Platelets: 308 10*3/uL (ref 150–400)
RDW: 13.3 % (ref 11.5–15.5)
WBC: 8.5 10*3/uL (ref 4.0–10.5)

## 2011-10-06 LAB — BASIC METABOLIC PANEL
BUN: 12 mg/dL (ref 6–23)
Calcium: 8.8 mg/dL (ref 8.4–10.5)
Creatinine, Ser: 0.74 mg/dL (ref 0.50–1.10)
GFR calc Af Amer: >90 mL/min (ref >90)

## 2011-10-06 LAB — GLUCOSE, CAPILLARY: Glucose-Capillary: 153 mg/dL — ABNORMAL HIGH (ref 70–99)

## 2011-10-06 MED ORDER — PROMETHAZINE HCL 12.5 MG PO TABS: 12.5000 mg | ORAL_TABLET | Freq: Three times a day (TID) | ORAL | Status: DC | PRN

## 2011-10-06 MED ORDER — POTASSIUM CHLORIDE CRYS ER 20 MEQ PO TBCR
20.0000 meq | EXTENDED_RELEASE_TABLET | Freq: Every day | ORAL | Status: DC
  Administered 2011-10-06: 20 meq via ORAL

## 2011-10-06 MED ORDER — INSULIN GLARGINE 100 UNIT/ML ~~LOC~~ SOLN: 30.0000 [IU] | Freq: Every day | SUBCUTANEOUS | Status: DC

## 2011-10-06 MED ORDER — LEVOFLOXACIN 500 MG PO TABS: 500.0000 mg | ORAL_TABLET | Freq: Every day | ORAL | Status: AC

## 2011-10-06 MED ORDER — POTASSIUM CHLORIDE CRYS ER 20 MEQ PO TBCR: 20.0000 meq | EXTENDED_RELEASE_TABLET | Freq: Every day | ORAL | Status: DC

## 2011-10-06 MED ORDER — INSULIN ASPART 100 UNIT/ML ~~LOC~~ SOLN: 3.0000 [IU] | Freq: Three times a day (TID) | SUBCUTANEOUS | Status: DC

## 2011-10-06 MED ORDER — LEVOFLOXACIN 500 MG PO TABS: 500.0000 mg | ORAL_TABLET | Freq: Every day | ORAL | Status: DC

## 2011-10-06 NOTE — Progress Notes (Signed)
Pt had ambulated with beginning O2 saturation at 97% on room air prior to ambulation. O2 saturation had stayed between 93% to 97% while ambulating. Pt did not c/o of SOB before,during and after ambulation. Will update Dr. Tamala Julian. Thanks Carlynn Spry

## 2011-10-06 NOTE — Discharge Instructions (Addendum)
Please call on Monday  [phone] and make an appointment with Dr. Doreene Nest At follow up please bring your meter to check your blood sugars Also will need to draw blood to check your potassium again.    Correction Insulin Your caregiver has decided you need insulin at home. You have been given a correctional scale (sliding scale) in case you need extra insulin when your blood sugar is too high (hyperglycemia). The following instructions will assist you in how to use that correctional scale.  WHAT IS A CORRECTIONAL SCALE (SLIDING SCALE)?  When you check your blood sugar, sometimes it will be higher than your caregiver wants it to be. You may need an extra dose of insulin to bring your blood sugar to your desired level (also known as your goal, target level, or normal level.) The correctional scale is prescribed by your caregiver based on your specific needs. Your correctional scale has 2 parts. The first shows you a blood sugar range. The second part tells you how much extra insulin to give yourself if your blood sugar falls within this range. You will not need an extra dose of insulin if your blood glucose is in the desired range. You should always give yourself the normal amount of insulin your caregiver ordered as well.  The most common time to check your blood sugar is before eating and at bedtime. Check with your caregiver so he or she can tell you what the best times are for you.  WHY IS IT IMPORTANT TO KEEP YOUR BLOOD SUGAR LEVELS AT YOUR DESIRED LEVEL?  It helps to prevent long-term complications of diabetes, such as eye disease, kidney failure, and other serious complications. WHAT TYPE OF INSULIN WILL YOU USE?  To help bring down blood sugars that are too high, your caregiver has prescribed a short-acting or a rapid-acting insulin. An example of a short-acting insulin would be Regular. Remember, you may also have a longer-acting insulin.  WHAT DO I NEED TO DO?   Check your blood  sugar with your home blood glucose meter as recommended by your caregiver.   Using your correctional scale, find the range your blood sugar lies in.   Look for the units of insulin that matches the blood sugar range. Give yourself the dose of correctional insulin your caregiver has prescribed. Always make sure you are using the right type of insulin.   Prior to the injection make sure you have food available that you can eat in the next 15 to 30 minutes.   If your correctional insulin is rapid acting, start eating your meal within 15 minutes after you have given yourself the insulin injection. If you wait longer than 15 minutes to eat, your blood sugar might get too low.   If your correctional insulin is short acting (Regular), start eating your meal within 30 minutes after you have given yourself the insulin injection. If you wait longer than 30 minutes to eat, your blood sugar might get too low. Symptoms of low blood sugar (hypoglycemia) may include feeling shaky or weak, sweating a lot, not thinking straight, difficulty seeing, agitation, or crankiness. Check your blood sugar immediately and treat your results as directed by your caregiver.   Keep a log of your blood sugar results with the time you took the test and the amount of insulin that you injected. This information will help your caregiver manage your medications.   Note on your log anything that may affect your blood sugars such as:  Changes in normal exercise or activity.   Changes in your normal schedule, such as staying up late, going on vacation, changing your diet, or holidays.   New medications. This includes all medications. Some medications, even those that do not require a prescription, may cause high blood sugars.   Illness or stress.   Changes in when you actually took your medication.   Changes in your meals, such as skipping a meal, a late meal, or dining out.   Eating things that may affect blood glucose,  such as snacks, larger meal portions than normal, or drinks with sugar.   Ask your caregiver any questions you have.  WHY DO I NEED A CORRECTIONAL SCALE (SLIDING SCALE) IF I HAVE NEVER BEEN DIAGNOSED WITH DIABETES?   Keeping your blood glucose in range is important for your overall health.   You may have been prescribed medications that cause your blood glucose to be higher than normal.   Your discharging caregiver can provide additional information as needed.  WHEN SHOULD I SEEK MEDICAL CARE?  If you have experienced hypoglycemia that you are unable to treat with your usual routine.   You have questions about your care.   You have persistent hypoglycemia or hyperglycemia.  Someone who lives with you should seek emergency medical care if you become unresponsive. MAKE SURE YOU:  Understand these instructions.   Will watch your condition.   Will get help right away if you are not doing well or get worse.  Document Released: 09/14/2010 Document Revised: 04/12/2011 Document Reviewed: 09/14/2010 [facility] Patient Information 2012 [place].Blood Sugar Monitoring, Adult GLUCOSE METERS FOR SELF-MONITORING OF BLOOD GLUCOSE  It is important to be able to correctly measure your blood sugar (glucose). You can use a blood glucose monitor (a small battery-operated device) to check your glucose level at any time. This allows you and your caregiver to monitor your diabetes and to determine how well your treatment plan is working. The process of monitoring your blood glucose with a glucose meter is called self-monitoring of blood glucose (SMBG). When people with diabetes control their blood sugar, they have better health. To test for glucose with a typical glucose meter, place the disposable strip in the meter. Then place a small sample of blood on the "test strip." The test strip is coated with chemicals that combine with glucose in blood. The meter measures how much glucose is present. The  meter displays the glucose level as a number. Several new models can record and store a number of test results. Some models can connect to personal computers to store test results or print them out.  Newer meters are often easier to use than older models. Some meters allow you to get blood from places other than your fingertip. Some new models have automatic timing, error codes, signals, or barcode readers to help with proper adjustment (calibration). Some meters have a large display screen or spoken instructions for people with visual impairments.  INSTRUCTIONS FOR USING GLUCOSE METERS  Wash your hands with soap and warm water, or clean the area with alcohol. Dry your hands completely.   Prick the side of your fingertip with a lancet (a sharp-pointed tool used by hand).   Hold the hand down and gently milk the finger until a small drop of blood appears. Catch the blood with the test strip.   Follow the instructions for inserting the test strip and using the SMBG meter. Most meters require the meter to be turned on and the  test strip to be inserted before applying the blood sample.   Record the test result.   Read the instructions carefully for both the meter and the test strips that go with it. Meter instructions are found in the user manual. Keep this manual to help you solve any problems that may arise. Many meters use "error codes" when there is a problem with the meter, the test strip, or the blood sample on the strip. You will need the manual to understand these error codes and fix the problem.   New devices are available such as laser lancets and meters that can test blood taken from "alternative sites" of the body, other than fingertips. However, you should use standard fingertip testing if your glucose changes rapidly. Also, use standard testing if:   You have eaten, exercised, or taken insulin in the past 2 hours.   You think your glucose is low.   You tend to not feel symptoms of low  blood glucose (hypoglycemia).   You are ill or under stress.   Clean the meter as directed by the manufacturer.   Test the meter for accuracy as directed by the manufacturer.   Take your meter with you to your caregiver's office. This way, you can test your glucose in front of your caregiver to make sure you are using the meter correctly. Your caregiver can also take a sample of blood to test using a routine lab method. If values on the glucose meter are close to the lab results, you and your caregiver will see that your meter is working well and you are using good technique. Your caregiver will advise you about what to do if the results do not match.  FREQUENCY OF TESTING  Your caregiver will tell you how often you should check your blood glucose. This will depend on your type of diabetes, your current level of diabetes control, and your types of medicines. The following are general guidelines, but your care plan may be different. Record all your readings and the time of day you took them for review with your caregiver.   Diabetes type 1.   When you are using insulin with good diabetic control (either multiple daily injections or via a pump), you should check your glucose 4 times a day.   If your diabetes is not well controlled, you may need to monitor more frequently, including before meals and 2 hours after meals, at bedtime, and occasionally between 2 a.m. and 3 a.m.   You should always check your glucose before a dose of insulin or before changing the rate on your insulin pump.   Diabetes type 2.   Guidelines for SMBG in diabetes type 2 are not as well defined.   If you are on insulin, follow the guidelines above.   If you are on medicines, but not insulin, and your glucose is not well controlled, you should test at least twice daily.   If you are not on insulin, and your diabetes is controlled with medicines or diet alone, you should test at least once daily, usually before  breakfast.   A weekly profile will help your caregiver advise you on your care plan. The week before your visit, check your glucose before a meal and 2 hours after a meal at least daily. You may want to test before and after a different meal each day so you and your caregiver can tell how well controlled your blood sugars are throughout the course of a 24 hour  period.   Gestational diabetes (diabetes during pregnancy).   Frequent testing is often necessary. Accurate timing is important.   If you are not on insulin, check your glucose 4 times a day. Check it before breakfast and 1 hour after the start of each meal.   If you are on insulin, check your glucose 6 times a day. Check it before each meal and 1 hour after the first bite of each meal.   General guidelines.   More frequent testing is required at the start of insulin treatment. Your caregiver will instruct you.   Test your glucose any time you suspect you have low blood sugar (hypoglycemia).   You should test more often when you change medicines, when you have unusual stress or illness, or in other unusual circumstances.  OTHER THINGS TO KNOW ABOUT GLUCOSE METERS  Measurement Range. Most glucose meters are able to read glucose levels over a broad range of values from as low as 0 to as high as 600 mg/dL. If you get an extremely high or low reading from your meter, you should first confirm it with another reading. Report very high or very low readings to your caregiver.   Whole Blood Glucose versus Plasma Glucose. Some older home glucose meters measure glucose in your whole blood. In a lab or when using some newer home glucose meters, the glucose is measured in your plasma (one component of blood). The difference can be important. It is important for you and your caregiver to know whether your meter gives its results as "whole blood equivalent" or "plasma equivalent."   Display of High and Low Glucose Values. Part of learning how to  operate a meter is understanding what the meter results mean. Know how high and low glucose concentrations are displayed on your meter.   Factors that Affect Glucose Meter Performance. The accuracy of your test results depends on many factors and varies depending on the brand and type of meter. These factors include:   Low red blood cell count (anemia).   Substances in your blood (such as uric acid, vitamin C, and others).   Environmental factors (temperature, humidity, altitude).   Name-brand versus generic test strips.   Calibration. Make sure your meter is set up properly. It is a good idea to do a calibration test with a control solution recommended by the manufacturer of your meter whenever you begin using a fresh bottle of test strips. This will help verify the accuracy of your meter.   Improperly stored, expired, or defective test strips. Keep your strips in a dry place with the lid on.   Soiled meter.   Inadequate blood sample.  NEW TECHNOLOGIES FOR GLUCOSE TESTING Alternative site testing Some glucose meters allow testing blood from alternative sites. These include the:  Upper arm.   Forearm.   Base of the thumb.   Thigh.  Sampling blood from alternative sites may be desirable. However, it may have some limitations. Blood in the fingertips show changes in glucose levels more quickly than blood in other parts of the body. This means that alternative site test results may be different from fingertip test results, not because of the meter's ability to test accurately, but because the actual glucose concentration can be different.  Continuous Glucose Monitoring Devices to measure your blood glucose continuously are available, and others are in development. These methods can be more expensive than self-monitoring with a glucose meter. However, it is uncertain how effective and reliable these devices are. Your  caregiver will advise you if this approach makes sense for you. IF  BLOOD SUGARS ARE CONTROLLED, PEOPLE WITH DIABETES REMAIN HEALTHIER.  SMBG is an important part of the treatment plan of patients with diabetes mellitus. Below are reasons for using SMBG:   It confirms that your glucose is at a specific, healthy level.   It detects hypoglycemia and severe hyperglycemia.   It allows you and your caregiver to make adjustments in response to changes in lifestyle for individuals requiring medicine.   It determines the need for starting insulin therapy in temporary diabetes that happens during pregnancy (gestational diabetes).  Document Released: 04/26/2003 Document Revised: 04/12/2011 Document Reviewed: 08/17/2010 [facility] Patient Information 2012 [place].Diabetes and Exercise Regular exercise is important and can help:   Control blood glucose (sugar).   Decrease blood pressure.    Control blood lipids (cholesterol, triglycerides).   Improve overall health.  BENEFITS FROM EXERCISE  Improved fitness.   Improved flexibility.   Improved endurance.   Increased bone density.   Weight control.   Increased muscle strength.   Decreased body fat.   Improvement of the body's use of insulin, a hormone.   Increased insulin sensitivity.   Reduction of insulin needs.   Reduced stress and tension.   Helps you feel better.  People with diabetes who add exercise to their lifestyle gain additional benefits, including:  Weight loss.   Reduced appetite.   Improvement of the body's use of blood glucose.   Decreased risk factors for heart disease:   Lowering of cholesterol and triglycerides.   Raising the level of good cholesterol (high-density lipoproteins, HDL).   Lowering blood sugar.   Decreased blood pressure.  TYPE 1 DIABETES AND EXERCISE  Exercise will usually lower your blood glucose.   If blood glucose is greater than 240 mg/dl, check urine ketones. If ketones are present, do not exercise.   Location of the insulin  injection sites may need to be adjusted with exercise. Avoid injecting insulin into areas of the body that will be exercised. For example, avoid injecting insulin into:   The arms when playing tennis.   The legs when jogging. For more information, discuss this with your caregiver.   Keep a record of:   Food intake.   Type and amount of exercise.   Expected peak times of insulin action.   Blood glucose levels.  Do this before, during, and after exercise. Review your records with your caregiver. This will help you to develop guidelines for adjusting food intake and insulin amounts.  TYPE 2 DIABETES AND EXERCISE  Regular physical activity can help control blood glucose.   Exercise is important because it may:   Increase the body's sensitivity to insulin.   Improve blood glucose control.   Exercise reduces the risk of heart disease. It decreases serum cholesterol and triglycerides. It also lowers blood pressure.   Those who take insulin or oral hypoglycemic agents should watch for signs of hypoglycemia. These signs include dizziness, shaking, sweating, chills, and confusion.   Body water is lost during exercise. It must be replaced. This will help to avoid loss of body fluids (dehydration) or heat stroke.  Be sure to talk to your caregiver before starting an exercise program to make sure it is safe for you. Remember, any activity is better than none.  Document Released: 07/14/2003 Document Revised: 04/12/2011 Document Reviewed: 10/28/2008 [facility] Patient Information 2012 [place].Insulin Storage and Care All insulin pens and bottles (vials) have expiration dates. Refrigerated, unopened  insulin pens and vials are good until the expiration date. Do not use insulin after this date. Once insulin is opened, it should be thrown away at the appropriate time. The table below will help guide you. INSULINS  Humalog (lispro)   Vials: 28 days   Pens: 28 days   NovoLog(aspart)    Vials: 28 days   Pens: 28 days   Apidra( glulisine)   Vials: 28 days*   Pens: 28 days*   Humulin R (regular)   Vials: 28 days   Pens: 28 days   Humulin R (regular) U-500   Vials: 28 days   Pens: NA   Novolin R (regular)   Vials: 42 days*   Pens: 28 days   Humulin N (NPH)   Vials: 28 days   Pens: 14 days   Novolin N (NPH)   Vials: 42 days*   Pens: 14 days   Humulin 70/30   Vials: 28 days   Pens: 10 days   Humulin 50/50   Vials: 28 days   Pens: 10 days   Novolin 70/30   Vials: 42 days*   Pens: 10 days   Humalog Mix 75/25   Vials: 28 days   Pens: 10 days   Humalog 50/50   Vials: 28 days   Pens: 10 days   NovoLog Mix 70/30   Vials: 28 days   Pens: 14 days   Lantus (glargine)   Vials: 28 days   Pens: 28 days   Levemir (detemir)   Vials: 42 days*   Pens: 42 days*  CARING FOR YOUR INSULIN  If insulin is kept at room temperature, it must be less than 86 F (30 C). Those marked * must be stored at less than 77 F (25 C).   If insulin is kept in the refrigerator, it must be between 36-46 F (2.22-7.77 C).   Do not freeze insulin.   Keep insulin away from direct heat or sunlight.   Throw away the insulin if it is discolored, thick or has clumps, or suspended white particles.   Be sure to mix "cloudy" insulins by rolling gently. Pens can be "rocked" from end to end.   Opened insulin pens should be kept at room temperature.   Always have extra insulin on hand.  Every effort is made to assure accuracy. This information is meant to be used as a tool to guide you in the proper care and storage of insulin. Document Released: 02/18/2009 Document Revised: 04/12/2011 Document Reviewed: 02/18/2009 [facility] Patient Information 2012 [place].

## 2011-10-06 NOTE — Progress Notes (Signed)
Pt O2 sats 88-89% on RA while sleeping. Pt. placed on 2L [place] O2 sats increased to >94%. Will continue to monitor.

## 2011-10-06 NOTE — Progress Notes (Signed)
Daily Progress Note [name]. [name], M.D., M.B.A  Family Medicine PGY-1 Pager [phone]  Patient name: Sheila Austin Medical record number: [id number] Date of birth: December 03, 1985 Age: 26 y.o. Gender: female  Subjective:  O/N events -patient was placed on 2L of O2 again last night due to desat of <88% while sleeping. Patient states though she is feeling better overall and is concern though about going home if her sats keep dropping.  Patient though is now on RA talking to me with sats in the high 90's, still tachycardic.   Objective: Vital signs in last 24 hours: Temp:  [97.9 F (36.6 C)-99.3 F (37.4 C)] 97.9 F (36.6 C) (06/01 0535) Pulse Rate:  [84-94] 84  (06/01 0535) Resp:  [13-22] 13  (06/01 0535) BP: (94-137)/(64-87) 94/64 mmHg (06/01 0535) SpO2:  [95 %-100 %] 100 % (06/01 0535) Weight change:  Last BM Date: 10/05/11  Intake/Output from previous day: 05/31 0701 - 06/01 0700 In: 3 [I.V.:3] Out: 600 [Urine:600] Intake/Output this shift:    Constitutional: obese young WF, very pleasant and conversant,oriented x 4, Mild distress and anxious  Neck: Normal range of motion. Neck supple.  Cardiovascular: No murmur heard. Sinus tachycardia  Respiratory: CTAB today with good air movement.  GI: Soft. Bowel sounds are normal. NT, ND  Neurological: She is alert and oriented to person, place, and time. No focal deficits.  Skin: No rash noted. No erythema.   Lab Results:  [facility] 10/06/11 0540 10/04/11 0510  WBC 8.5 9.7  HGB 12.1 11.2*  HCT 34.9* 31.9*  PLT 308 187   BMET  Basename 10/06/11 0540 10/05/11 0540  NA 139 136  K 3.3* 2.9*  CL 98 95*  CO2 25 25  GLUCOSE 149* 221*  BUN 12 11  CREATININE 0.74 0.84  CALCIUM 8.8 8.6    Studies/Results: Dg Chest 2 View  10/05/2011  *RADIOLOGY REPORT*  Clinical Data: Shortness of breath with increased oxygen requirement.  CHEST - 2 VIEW  Comparison: 10/04/2011  Findings: Shallow inspiration.  Mild enlarging heart size and  pulmonary vascularity suggesting developing congestion.  No focal airspace consolidation.  No blunting of costophrenic angles.  No pneumothorax.  Bilateral cervical ribs.  IMPRESSION: Mild increase of heart size and pulmonary vascularity suggesting congestion.  Original Report Authenticated By: Neale Burly, M.D.    Medications: I have reviewed the patient's current medications.  Assessment/Plan: 26 year old female with DM presents with Urosepsis, abdominal pain and fever.   # Dyspnea -  > acute onset 5/30 in AM; CXR no acute cardiopulmonary dz or infiltrate; elevated D-dimer but CT-A negative for PE; CTA concerning for pulm edema,  ABG consistent with tachypnea;  Do think related somewhat with anxiety as well.  - ECHO results, EF 50% with trivial pericardial effusion, otherwise unremarkable.  -Pt was able to ambulate 5 minutes with no desats less the 93% on room air, improved clinically.  - likelihood anxiety component as well.    # E. Coli Sepsis: sensitive to all antibiotics except AMP. Started levofloxacin 5/28  -will finish 10 day coarse. Patient tolerating medication well.   # RUQ Pain: resolving improved at this time.    # Diarrhea: resolved 5/29, c. Diff negative   # DM2: Insulin drip in ED. Transitioned off insulin 5/28 since not acidotic; A1c 11.4  - continue Lantus to 30 U,continue sliding scale, patient will be given rx for lantus pen and novolog pen. Novolog 3 units with meals.   # Hx H.pylori: No complaints  of reflux of gastritis at this time.  - Protonix 40 PO daily as outpt  # Hypertension: BP within normal limits.  - Hold HCTZ for now, will readdress at follow up with PCP  # Gastroparesis: No home medications.  - Slowly advance diet   # Hyponatremia - likely 2/2 hypovolemia; resolved   # Hypokalemia - secondary to diarrhea and insulin  -improved, will continue  20 meq daily, kidneys work well expect some diuresis post pyelo.  -f/u outpatient with  BMET   Dispo: at this time, feel patient can be discharged with close follow up Has good support at home Continue antibiotics Will have family call on Monday to get appointment with Dr. Doreene Nest next week or within 2 weeks. Need followup BMET at that time. Make sure patient is doing well with glucose control.      LOS: 5 days   Cassidy Tabet 10/06/2011, 8:40 AM

## 2011-10-06 NOTE — Progress Notes (Signed)
Seen and examined.  Discussed with Dr. Tamala Julian.  Agree with him that she is ready for DC today on meds outlined.

## 2011-10-07 LAB — CULTURE, BLOOD (ROUTINE X 2)

## 2011-10-08 ENCOUNTER — Encounter: Payer: Self-pay | Admitting: Family Medicine

## 2011-10-08 ENCOUNTER — Telehealth: Payer: Self-pay | Admitting: Family Medicine

## 2011-10-08 DIAGNOSIS — E119 Type 2 diabetes mellitus without complications: Secondary | ICD-10-CM

## 2011-10-08 MED ORDER — INSULIN SYRINGES (DISPOSABLE) U-100 0.5 ML MISC: Status: DC

## 2011-10-08 MED ORDER — BLOOD GLUCOSE MONITORING SUPPL SUPPLIES MISC: Status: DC

## 2011-10-08 NOTE — Telephone Encounter (Signed)
Just got out of hosp and they did not give her enough test strips, needles, lancets for her True Results - 32 gauge needles  [facility]

## 2011-10-08 NOTE — Telephone Encounter (Addendum)
Consulted with Dr. Andria Frames   and supplies ordered. I called RX for lancets , test strips and syringes to the pharmacy. Will forward this to Dr. Andria Frames to sign off on orders.

## 2011-10-08 NOTE — Telephone Encounter (Signed)
Saw in hospital.  Now insulin treated.  Will be attending DM education classes.  Has case Freight forwarder.

## 2011-10-08 NOTE — Discharge Summary (Signed)
Reviewed.  I agree with Dr. Thea Gist documentation and management.

## 2011-10-15 ENCOUNTER — Telehealth: Payer: Self-pay | Admitting: Family Medicine

## 2011-10-15 NOTE — Telephone Encounter (Signed)
Pt is wanting to go back to work tonight and needs a note to release her back to work.  pls call when ready

## 2011-10-15 NOTE — Telephone Encounter (Signed)
Called 2x and the Phone is d/c'ed.Sheila Austin

## 2011-10-16 NOTE — Telephone Encounter (Signed)
Pt's phone is still d/c'ed.Sheila Austin

## 2011-10-17 NOTE — Telephone Encounter (Signed)
FMLA form faxed to [employer], [place].

## 2011-10-22 ENCOUNTER — Ambulatory Visit: Payer: 59 | Admitting: Family Medicine

## 2011-10-31 ENCOUNTER — Encounter: Payer: 59 | Attending: Family Medicine

## 2011-11-01 ENCOUNTER — Encounter: Payer: Self-pay | Admitting: Family Medicine

## 2011-11-01 ENCOUNTER — Ambulatory Visit: Payer: 59 | Admitting: Family Medicine

## 2012-03-17 ENCOUNTER — Encounter: Payer: Self-pay | Admitting: Home Health Services

## 2012-03-18 ENCOUNTER — Encounter: Payer: Self-pay | Admitting: Home Health Services

## 2012-06-05 ENCOUNTER — Emergency Department (HOSPITAL_COMMUNITY): Payer: Self-pay

## 2012-06-05 ENCOUNTER — Encounter (HOSPITAL_COMMUNITY): Payer: Self-pay | Admitting: Emergency Medicine

## 2012-06-05 ENCOUNTER — Emergency Department (HOSPITAL_COMMUNITY)
Admission: EM | Admit: 2012-06-05 | Discharge: 2012-06-05 | Disposition: A | Discharge status: Home / Self Care | Payer: Self-pay | Attending: Emergency Medicine | Admitting: Emergency Medicine

## 2012-06-05 DIAGNOSIS — S61209A Unspecified open wound of unspecified finger without damage to nail, initial encounter: Secondary | ICD-10-CM | POA: Insufficient documentation

## 2012-06-05 DIAGNOSIS — Z8619 Personal history of other infectious and parasitic diseases: Secondary | ICD-10-CM | POA: Insufficient documentation

## 2012-06-05 DIAGNOSIS — Z794 Long term (current) use of insulin: Secondary | ICD-10-CM | POA: Insufficient documentation

## 2012-06-05 DIAGNOSIS — E119 Type 2 diabetes mellitus without complications: Secondary | ICD-10-CM | POA: Insufficient documentation

## 2012-06-05 DIAGNOSIS — Z23 Encounter for immunization: Secondary | ICD-10-CM | POA: Insufficient documentation

## 2012-06-05 DIAGNOSIS — IMO0002 Reserved for concepts with insufficient information to code with codable children: Secondary | ICD-10-CM

## 2012-06-05 DIAGNOSIS — Z8719 Personal history of other diseases of the digestive system: Secondary | ICD-10-CM | POA: Insufficient documentation

## 2012-06-05 DIAGNOSIS — W268XXA Contact with other sharp object(s), not elsewhere classified, initial encounter: Secondary | ICD-10-CM | POA: Insufficient documentation

## 2012-06-05 DIAGNOSIS — Y93G1 Activity, food preparation and clean up: Secondary | ICD-10-CM | POA: Insufficient documentation

## 2012-06-05 DIAGNOSIS — Y929 Unspecified place or not applicable: Secondary | ICD-10-CM | POA: Insufficient documentation

## 2012-06-05 LAB — CBC WITH DIFFERENTIAL/PLATELET
Basophils Relative: 1 % (ref 0–1)
Eosinophils Relative: 2 % (ref 0–5)
Hemoglobin: 14.5 g/dL (ref 12.0–15.0)
Lymphocytes Relative: 33 % (ref 12–46)
MCH: 29.6 pg (ref 26.0–34.0)
Monocytes Absolute: 0.7 10*3/uL (ref 0.1–1.0)
Monocytes Relative: 6 % (ref 3–12)
Neutrophils Relative %: 58 % (ref 43–77)
RBC: 4.9 MIL/uL (ref 3.87–5.11)
WBC: 11.1 10*3/uL — ABNORMAL HIGH (ref 4.0–10.5)

## 2012-06-05 LAB — PROTIME-INR
INR: 0.9 (ref 0.00–1.49)
Prothrombin Time: 12.1 seconds (ref 11.6–15.2)

## 2012-06-05 MED ORDER — TETANUS-DIPHTH-ACELL PERTUSSIS 5-2.5-18.5 LF-MCG/0.5 IM SUSP
0.5000 mL | Freq: Once | INTRAMUSCULAR | Status: AC
  Administered 2012-06-05: 0.5 mL via INTRAMUSCULAR
  Filled 2012-06-05: qty 0.5

## 2012-06-05 MED ORDER — HYDROCODONE-ACETAMINOPHEN 5-325 MG PO TABS: 2.0000 | ORAL_TABLET | ORAL | Status: DC | PRN

## 2012-06-05 NOTE — ED Notes (Signed)
Pt presents with left arm bruising due to blood pressure cuff. Pt states "I just bruise really easy."

## 2012-06-05 NOTE — ED Notes (Signed)
Ortho tech at bedside 

## 2012-06-05 NOTE — ED Provider Notes (Signed)
History     CSN: [id number]  Arrival date & time 06/05/12  2101   First MD Initiated Contact with Patient 06/05/12 2141      Chief Complaint  Patient presents with  . Hand Pain  . Laceration    (Consider location/radiation/quality/duration/timing/severity/associated sxs/prior treatment) Patient is a 27 y.o. female presenting with skin laceration. The history is provided by the patient and medical records.  Laceration     Sheila Austin is a 27 y.o. female  with a hx of IDDM, GERD presents to the Emergency Department complaining of acute, stable and hemostatic laceration to the R hand onset 8pm. Pt states she dropped a clean plate which broke and cut her right middle and ring finger.  Associated symptoms include pain and bleeding.  nothingmakes it better and nothing makes it worse.  Pt denies fever, chills, headache, weakness, numbness, tingling, loss of function.    Past Medical History  Diagnosis Date  . Diabetes mellitus   . Ectopic pregnancy   . H. pylori infection   . GERD (gastroesophageal reflux disease)   . Diabetes mellitus   . Morbid obesity with BMI of 50.0-59.9, adult 10/05/2011    Past Surgical History  Procedure Date  . No past surgeries     Family History  Problem Relation Age of Onset  . Arthritis Mother   . Asthma Mother   . COPD Mother   . Depression Mother   . Diabetes Mother   . Hypertension Mother   . Mental illness Mother   . Kidney disease Mother   . Crohn's disease Mother   . Mental illness Sister   . Arthritis Maternal Aunt   . Asthma Maternal Aunt   . COPD Maternal Aunt   . Depression Maternal Aunt   . Diabetes Maternal Aunt   . Hypertension Maternal Aunt   . Mental illness Maternal Aunt   . Stroke Maternal Aunt   . Heart failure Maternal Aunt   . Heart failure Other     History  Substance Use Topics  . Smoking status: Never Smoker   . Smokeless tobacco: Never Used  . Alcohol Use: No    OB History    Grav Para Term  Preterm Abortions TAB SAB Ect Mult Living   1    1   1   0      Review of Systems  Constitutional: Negative for fever.  Skin: Positive for wound.  Neurological: Negative for weakness and numbness.  All other systems reviewed and are negative.    Allergies  Penicillins  Home Medications   Current Outpatient Rx  Name  Route  Sig  Dispense  Refill  . NAPROXEN 500 MG PO TABS   Oral   Take 500 mg by mouth 2 (two) times daily as needed. As needed for headache         . BLOOD GLUCOSE MONITORING SUPPL SUPPLIES MISC      Please provide glucose test strips and  lancets for True Results glucose meter. Check blood sugar three times daily   100 each   3   . HYDROCODONE-ACETAMINOPHEN 5-325 MG PO TABS   Oral   Take 2 tablets by mouth every 4 (four) hours as needed for pain.   10 tablet   0   . INSULIN SYRINGES (DISPOSABLE) U-100 0.5 ML MISC      Use with insulin as directed   100 each   1     BP 150/104  Pulse 114  Temp 98.1 F (36.7 C) (Oral)  Resp 16  SpO2 99%  LMP 05/29/2012  Physical Exam  Nursing note and vitals reviewed. Constitutional: She appears well-developed and well-nourished. No distress.  HENT:  Head: Normocephalic and atraumatic.  Eyes: Conjunctivae normal are normal. No scleral icterus.  Cardiovascular: Normal rate, regular rhythm, normal heart sounds and intact distal pulses.  Exam reveals no gallop and no friction rub.   No murmur heard.      Capillary refill < 3 sec  Pulmonary/Chest: Effort normal and breath sounds normal. No respiratory distress. She has no wheezes. She has no rales. She exhibits no tenderness.  Musculoskeletal: Normal range of motion. She exhibits no edema.       Full ROM of right middle and ring finger  4cm laceration to the dorsum of the middle finger at the PIP joint 2cm laceration to the dorsum of the  ring finger at the PIP joint  Neurological: She is alert.       Sensation intact Strength 5/5 in the fingers and wrist   Skin: Skin is warm and dry. She is not diaphoretic.  Psychiatric: She has a normal mood and affect.    ED Course  LACERATION REPAIR Date/Time: 06/05/2012 10:30 PM Performed by: Abigail Butts Authorized by: Abigail Butts Consent: Verbal consent obtained. Risks and benefits: risks, benefits and alternatives were discussed Consent given by: patient Patient understanding: patient states understanding of the procedure being performed Patient consent: the patient's understanding of the procedure matches consent given Procedure consent: procedure consent matches procedure scheduled Relevant documents: relevant documents present and verified Site marked: the operative site was marked Required items: required blood products, implants, devices, and special equipment available Patient identity confirmed: verbally with patient Time out: Immediately prior to procedure a "time out" was called to verify the correct patient, procedure, equipment, support staff and site/side marked as required. Body area: upper extremity Location details: right long finger Laceration length: 4 cm Foreign bodies: no foreign bodies Tendon involvement: none Nerve involvement: none Vascular damage: no Anesthesia: local infiltration Local anesthetic: lidocaine 1% without epinephrine Anesthetic total: 3 ml Patient sedated: no Preparation: Patient was prepped and draped in the usual sterile fashion. Irrigation solution: saline Irrigation method: syringe Amount of cleaning: standard Debridement: none Degree of undermining: none Skin closure: 4-0 Prolene Number of sutures: 2 Technique: simple Approximation: close Approximation difficulty: simple Dressing: 4x4 sterile gauze Patient tolerance: Patient tolerated the procedure well with no immediate complications.  LACERATION REPAIR Date/Time: 06/05/2012 10:31 PM Performed by: Abigail Butts Authorized by: Abigail Butts Consent: Verbal  consent obtained. Risks and benefits: risks, benefits and alternatives were discussed Consent given by: patient Patient understanding: patient states understanding of the procedure being performed Patient consent: the patient's understanding of the procedure matches consent given Procedure consent: procedure consent matches procedure scheduled Relevant documents: relevant documents present and verified Required items: required blood products, implants, devices, and special equipment available Patient identity confirmed: verbally with patient Time out: Immediately prior to procedure a "time out" was called to verify the correct patient, procedure, equipment, support staff and site/side marked as required. Body area: upper extremity Location details: right ring finger Laceration length: 2 cm Foreign bodies: no foreign bodies Tendon involvement: none Nerve involvement: none Vascular damage: no Anesthesia: local infiltration Local anesthetic: lidocaine 1% without epinephrine Anesthetic total: 3 ml Patient sedated: no Preparation: Patient was prepped and draped in the usual sterile fashion. Irrigation solution: saline Irrigation method: syringe Amount of cleaning: standard Debridement: none Degree of  undermining: none Skin closure: 4-0 Prolene Number of sutures: 4 Technique: running Approximation: close Approximation difficulty: simple Dressing: 4x4 sterile gauze Patient tolerance: Patient tolerated the procedure well with no immediate complications.   (including critical care time)  Labs Reviewed  CBC WITH DIFFERENTIAL - Abnormal; Notable for the following:    WBC 11.1 (*)     All other components within normal limits  PROTIME-INR   Dg Hand Complete Right  06/05/2012  *RADIOLOGY REPORT*  Clinical Data: Right hand pain/laceration.  RIGHT HAND - COMPLETE 3+ VIEW  Comparison: None.  Findings: No fracture or dislocation.  No aggressive osseous lesion.  No radiopaque foreign body.   IMPRESSION: No acute osseous abnormality of the right hand.   Original Report Authenticated By: Carlos Levering, M.D.      1. Laceration       MDM  Sheila Austin presents with laceration. Tdap updated.  Pressure irrigation performed. Laceration occurred < 8 hours prior to repair which was well tolerated. Pt has with IDDM which may effect normal wound healing; pt educated on this. Discussed suture home care w pt and answered questions. Pt to f-u for wound check and suture removal in 7 days. Pt is hemodynamically stable w no complaints prior to dc.    1. Medications: vicodin, usual home medications 2. Treatment: rest, drink plenty of fluids, keep wound clean and dry, do not submerge in water 3. Follow Up: Please followup with your primary doctor for discussion of your diagnoses and further evaluation after today's visit; if you do not have a primary care doctor use the resource guide provided to find one          Abigail Butts, PA-C 06/05/12 2317

## 2012-06-05 NOTE — ED Notes (Signed)
PT in Radiology.

## 2012-06-05 NOTE — ED Notes (Signed)
Patient was putting dishes away out of dishwasher, dropped a plate, tried to catch it, lacerated the top of right middle and right ring finger.  Bleeding controlled at this time.

## 2012-06-05 NOTE — Progress Notes (Signed)
Orthopedic Tech Progress Note Patient Details:  Sheila Austin 1986/04/18 [id number]  Ortho Devices Type of Ortho Device: Finger splint Ortho Device/Splint Location: (R) UE Ortho Device/Splint Interventions: Application   Braulio Bosch 06/05/2012, 11:10 PM

## 2012-06-08 NOTE — ED Provider Notes (Signed)
Medical screening examination/treatment/procedure(s) were performed by non-physician practitioner and as supervising physician I was immediately available for consultation/collaboration.   Saddie Benders. Dorna Mai, MD 06/08/12 [phone]

## 2012-06-12 ENCOUNTER — Encounter (HOSPITAL_COMMUNITY): Payer: Self-pay | Admitting: *Deleted

## 2012-06-12 ENCOUNTER — Emergency Department (HOSPITAL_COMMUNITY)
Admission: EM | Admit: 2012-06-12 | Discharge: 2012-06-12 | Disposition: A | Discharge status: Home / Self Care | Payer: Self-pay | Attending: Emergency Medicine | Admitting: Emergency Medicine

## 2012-06-12 DIAGNOSIS — Z4802 Encounter for removal of sutures: Secondary | ICD-10-CM | POA: Insufficient documentation

## 2012-06-12 DIAGNOSIS — Z8619 Personal history of other infectious and parasitic diseases: Secondary | ICD-10-CM | POA: Insufficient documentation

## 2012-06-12 DIAGNOSIS — Z8719 Personal history of other diseases of the digestive system: Secondary | ICD-10-CM | POA: Insufficient documentation

## 2012-06-12 DIAGNOSIS — Z6841 Body Mass Index (BMI) 40.0 and over, adult: Secondary | ICD-10-CM | POA: Insufficient documentation

## 2012-06-12 DIAGNOSIS — Z794 Long term (current) use of insulin: Secondary | ICD-10-CM | POA: Insufficient documentation

## 2012-06-12 DIAGNOSIS — E119 Type 2 diabetes mellitus without complications: Secondary | ICD-10-CM | POA: Insufficient documentation

## 2012-06-12 MED ORDER — SULFAMETHOXAZOLE-TRIMETHOPRIM 800-160 MG PO TABS: 1.0000 | ORAL_TABLET | Freq: Two times a day (BID) | ORAL | Status: AC

## 2012-06-12 NOTE — ED Notes (Signed)
Pt dc to home.  Pt ambulatory to exit with out difficulty.  Pt states understanding to dc instructions

## 2012-06-12 NOTE — ED Notes (Signed)
Pt here for suture removal to 3 rd and 4 th digits.

## 2012-06-12 NOTE — ED Provider Notes (Signed)
Medical screening examination/treatment/procedure(s) were performed by non-physician practitioner and as supervising physician I was immediately available for consultation/collaboration.  Jasper Riling. Alvino Chapel, MD 06/12/12 [phone]

## 2012-06-12 NOTE — ED Provider Notes (Signed)
History     CSN: [id number]  Arrival date & time 06/12/12  1836   First MD Initiated Contact with Patient 06/12/12 1907      Chief Complaint  Patient presents with  . Suture / Staple Removal    (Consider location/radiation/quality/duration/timing/severity/associated sxs/prior treatment) Patient is a 27 y.o. female presenting with suture removal. The history is provided by the patient. No language interpreter was used.  Suture / Staple Removal  The sutures were placed 7 to 10 days ago. Treatments since wound repair include antibiotic ointment use. There has been no drainage from the wound. There is no redness present. There is no swelling present. The pain has no pain. She has no difficulty moving the affected extremity or digit.    Past Medical History  Diagnosis Date  . Diabetes mellitus   . Ectopic pregnancy   . H. pylori infection   . GERD (gastroesophageal reflux disease)   . Diabetes mellitus   . Morbid obesity with BMI of 50.0-59.9, adult 10/05/2011    Past Surgical History  Procedure Date  . No past surgeries     Family History  Problem Relation Age of Onset  . Arthritis Mother   . Asthma Mother   . COPD Mother   . Depression Mother   . Diabetes Mother   . Hypertension Mother   . Mental illness Mother   . Kidney disease Mother   . Crohn's disease Mother   . Mental illness Sister   . Arthritis Maternal Aunt   . Asthma Maternal Aunt   . COPD Maternal Aunt   . Depression Maternal Aunt   . Diabetes Maternal Aunt   . Hypertension Maternal Aunt   . Mental illness Maternal Aunt   . Stroke Maternal Aunt   . Heart failure Maternal Aunt   . Heart failure Other     History  Substance Use Topics  . Smoking status: Never Smoker   . Smokeless tobacco: Never Used  . Alcohol Use: No    OB History    Grav Para Term Preterm Abortions TAB SAB Ect Mult Living   1    1   1   0      Review of Systems  Constitutional: Negative for fever.  Skin: Negative for  color change and pallor.    Allergies  Penicillins  Home Medications   Current Outpatient Rx  Name  Route  Sig  Dispense  Refill  . BLOOD GLUCOSE MONITORING SUPPL SUPPLIES MISC      Please provide glucose test strips and  lancets for True Results glucose meter. Check blood sugar three times daily   100 each   3   . HYDROCODONE-ACETAMINOPHEN 5-325 MG PO TABS   Oral   Take 2 tablets by mouth every 4 (four) hours as needed for pain.   10 tablet   0   . INSULIN SYRINGES (DISPOSABLE) U-100 0.5 ML MISC      Use with insulin as directed   100 each   1   . NAPROXEN 500 MG PO TABS   Oral   Take 500 mg by mouth 2 (two) times daily as needed. As needed for headache           BP 132/88  Pulse 103  Temp 98.3 F (36.8 C) (Oral)  Resp 16  SpO2 99%  LMP 05/29/2012  Physical Exam  Constitutional: She is oriented to person, place, and time. She appears well-developed.       obese  HENT:  Head: Normocephalic and atraumatic.  Eyes: Conjunctivae normal are normal. No scleral icterus.  Neck: Normal range of motion.  Pulmonary/Chest: Effort normal.  Musculoskeletal: Normal range of motion.  Neurological: She is alert and oriented to person, place, and time.  Skin: No ecchymosis and no rash noted. She is not diaphoretic. No erythema. No pallor.  Psychiatric: She has a normal mood and affect. Her behavior is normal.    ED Course  Procedures (including critical care time)  Labs Reviewed - No data to display No results found.  SUTURE REMOVAL Performed by: Antonietta Breach  Consent: Verbal consent obtained. Patient identity confirmed: provided demographic data Time out: Immediately prior to procedure a "time out" was called to verify the correct patient, procedure, equipment, support staff and site/side marked as required.  Location details: 3rd and 4th digits on right hand distal to PIP joint  Wound Appearance: clean  Sutures/Staples Removed: 6; 4 from 4th digit and 2  from third digit  Facility: sutures placed in this facility Patient tolerance: Patient tolerated the procedure well with no immediate complications.    1. Visit for suture removal       MDM  Patient here for suture removal from 3rd and 4th digits on right hand distal to the PIP joint. Patient denies warmth, purulent drainage, pain and swelling at her suture site. Sutures removes without complication and patient tolerated procedure well. Small 1cm area of erythema distal to suture site on 3rd digit, possibly representing mild infection. Patient has hx of diabetes which can prolong healing time and put her at risk of infection so will discharge today with course of oral Abx. Patient has been instructed to return to ED if redness does not resolve; states will be compliant if further follow up is necessary.    Antonietta Breach, PA-C 06/12/12 [place], PA-C 06/12/12 2021

## 2012-11-13 ENCOUNTER — Other Ambulatory Visit: Payer: Self-pay

## 2012-12-16 ENCOUNTER — Inpatient Hospital Stay (HOSPITAL_COMMUNITY)
Admission: AD | Admit: 2012-12-16 | Discharge: 2012-12-17 | Disposition: A | Discharge status: Home / Self Care | Payer: Self-pay | Source: Ambulatory Visit | Attending: Obstetrics & Gynecology | Admitting: Obstetrics & Gynecology

## 2012-12-16 ENCOUNTER — Encounter (HOSPITAL_COMMUNITY): Payer: Self-pay | Admitting: *Deleted

## 2012-12-16 DIAGNOSIS — R109 Unspecified abdominal pain: Secondary | ICD-10-CM | POA: Insufficient documentation

## 2012-12-16 DIAGNOSIS — E119 Type 2 diabetes mellitus without complications: Secondary | ICD-10-CM | POA: Insufficient documentation

## 2012-12-16 DIAGNOSIS — Z6841 Body Mass Index (BMI) 40.0 and over, adult: Secondary | ICD-10-CM | POA: Insufficient documentation

## 2012-12-16 DIAGNOSIS — D72829 Elevated white blood cell count, unspecified: Secondary | ICD-10-CM | POA: Insufficient documentation

## 2012-12-16 LAB — CBC
HCT: 41.2 % (ref 36.0–46.0)
MCH: 30 pg (ref 26.0–34.0)
MCHC: 35.9 g/dL (ref 30.0–36.0)
MCV: 83.4 fL (ref 78.0–100.0)
RDW: 13 % (ref 11.5–15.5)
WBC: 12.7 10*3/uL — ABNORMAL HIGH (ref 4.0–10.5)

## 2012-12-16 LAB — POCT PREGNANCY, URINE: Preg Test, Ur: NEGATIVE

## 2012-12-16 LAB — URINE MICROSCOPIC-ADD ON

## 2012-12-16 LAB — URINALYSIS, ROUTINE W REFLEX MICROSCOPIC
Leukocytes, UA: NEGATIVE
Nitrite: NEGATIVE
Specific Gravity, Urine: >1.03 — ABNORMAL HIGH (ref 1.005–1.030)
Urobilinogen, UA: 0.2 mg/dL (ref 0.0–1.0)
pH: 5.5 (ref 5.0–8.0)

## 2012-12-16 NOTE — MAU Provider Note (Signed)
Chief Complaint: Vaginal Bleeding   First Provider Initiated Contact with Patient 12/16/12 2346     SUBJECTIVE HPI: Sheila Austin is a 27 y.o. [id number] female who presents with cramping since this morning that worsened throughout the day. Heavy vaginal bleeding started at noon. Moderate now. Last period 10/05/2012. Neg UPT in July. Hx irreg cycles and ectopic. TTC x 6 years. No pain relief w/ 400 mg Motrin. Mostly concerned about possible ectopic.   Past Medical History  Diagnosis Date  . Diabetes mellitus   . Ectopic pregnancy   . H. pylori infection   . GERD (gastroesophageal reflux disease)   . Diabetes mellitus   . Morbid obesity with BMI of 50.0-59.9, adult 10/05/2011   OB History   Grav Para Term Preterm Abortions TAB SAB Ect Mult Living   1    1   1   0     # Outc Date GA Lbr Len/2nd Wgt Sex Del Anes PTL Lv   1 ECT              Past Surgical History  Procedure Laterality Date  . No past surgeries     History   Social History  . Marital Status: Single    Spouse Name: N/A    Number of Children: N/A  . Years of Education: N/A   Occupational History  . Not on file.   Social History Main Topics  . Smoking status: Never Smoker   . Smokeless tobacco: Never Used  . Alcohol Use: No  . Drug Use: No  . Sexually Active: Yes -- Female partner(s)    Birth Control/ Protection: Condom     Comment: Pt is interested in taking BCP   Other Topics Concern  . 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   No current facility-administered medications on file prior to encounter.   Current Outpatient Prescriptions on File Prior to Encounter  Medication Sig Dispense Refill  . naproxen (NAPROSYN) 500 MG tablet Take 500 mg by mouth daily as needed. For pain       Allergies  Allergen Reactions  . Penicillins Anaphylaxis    ROS: Pos for mild chronic nausea. Neg for fever, chills, urinary complaints, vomiting,  diarrhea, constiaption, dyspareunia, loss of appetite, acne, hirsutism. Last BM today.   OBJECTIVE Blood pressure 126/80, pulse 102, temperature 98.3 F (36.8 C), temperature source Oral, resp. rate 16, height 5\' 2"  (1.575 m), weight 124.002 kg (273 lb 6 oz), last menstrual period 10/05/2012, SpO2 100.00%. GENERAL: Well-developed, well-nourished, morbidly obese female in no acute distress.  HEENT: Normocephalic HEART: normal rate RESP: normal effort ABDOMEN: Soft, mod low abd tenderness, R>L. Neg rebound, guarding. Pos BS.  EXTREMITIES: Nontender, no edema. Ringworm? NEURO: Alert and oriented SPECULUM EXAM: NEFG, small amount of bright red blood noted, cervix clean BIMANUAL: cervix closed; UTA uterine size due to body habitus, no adnexal tenderness or masses. No CMT.   LAB RESULTS Results for orders placed during the hospital encounter of 12/16/12 (from the past 24 hour(s))  URINALYSIS, ROUTINE W REFLEX MICROSCOPIC     Status: Abnormal   Collection Time    12/16/12  8:00 PM      Result Value Range   Color, Urine YELLOW  YELLOW   APPearance CLEAR  CLEAR   Specific Gravity, Urine >1.030 (*) 1.005 - 1.030   pH 5.5  5.0 - 8.0  Glucose, UA 100 (*) NEGATIVE mg/dL   Hgb urine dipstick LARGE (*) NEGATIVE   Bilirubin Urine NEGATIVE  NEGATIVE   Ketones, ur NEGATIVE  NEGATIVE mg/dL   Protein, ur NEGATIVE  NEGATIVE mg/dL   Urobilinogen, UA 0.2  0.0 - 1.0 mg/dL   Nitrite NEGATIVE  NEGATIVE   Leukocytes, UA NEGATIVE  NEGATIVE  URINE MICROSCOPIC-ADD ON     Status: Abnormal   Collection Time    12/16/12  8:00 PM      Result Value Range   Squamous Epithelial / LPF RARE  RARE   WBC, UA 0-2  <3 WBC/hpf   RBC / HPF 21-50  <3 RBC/hpf   Bacteria, UA FEW (*) RARE  POCT PREGNANCY, URINE     Status: None   Collection Time    12/16/12  8:10 PM      Result Value Range   Preg Test, Ur NEGATIVE  NEGATIVE  CBC     Status: Abnormal   Collection Time    12/16/12  9:54 PM      Result Value Range    WBC 12.7 (*) 4.0 - 10.5 K/uL   RBC 4.94  3.87 - 5.11 MIL/uL   Hemoglobin 14.8  12.0 - 15.0 g/dL   HCT 41.2  36.0 - 46.0 %   MCV 83.4  78.0 - 100.0 fL   MCH 30.0  26.0 - 34.0 pg   MCHC 35.9  30.0 - 36.0 g/dL   RDW 13.0  11.5 - 15.5 %   Platelets 259  150 - 400 K/uL  WET PREP, GENITAL     Status: Abnormal   Collection Time    12/17/12  1:42 AM      Result Value Range   Yeast Wet Prep HPF POC NONE SEEN  NONE SEEN   Trich, Wet Prep NONE SEEN  NONE SEEN   Clue Cells Wet Prep HPF POC NONE SEEN  NONE SEEN   WBC, Wet Prep HPF POC FEW (*) NONE SEEN    IMAGING Normal pelvic US.  MAU COURSE Declined pain meds. Very reassured to not have ectopic pregnancy.   ASSESSMENT Low abd pain of unknown etiology. Low suspicion for appendicitis due to lack of fever, no new GI Sx, mild leukocytosis.   PLAN Discharge home in stable condition. GC/CT, Urine cultures pending.  Appendicitis precautions.  Comfort measures. 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 F/U w/ [place] for worsening abd pain and or fever.  OTC Ibuprofen PRN.    Medication List    ASK your doctor about these medications       metFORMIN 500 MG tablet  Commonly known as:  GLUCOPHAGE  Take 1,000 mg by mouth 2 (two) times daily with a meal.     naproxen 500 MG tablet  Commonly known as:  NAPROSYN  Take 500 mg by mouth daily as needed. For pain       [place], [place] 12/17/2012  4:19 AM

## 2012-12-16 NOTE — MAU Note (Addendum)
PT SAYS  HER CYCLES ARE NOT NL REGULAR.     NO BRTH CONTROL- LAST SEX- 8-10.   SAYS  THIS AM - SHE AWOKE AND FELT  LOWER CRAMPING - THEN AT 12 NOON-   CRAMPS WORSE.  TRIED TO HAVE BB.  WHEN SAT ON TOILET- BLOOD CAME OUT - FROM VAGINA.    SHE DID HOME PREG TEST   AT July- NEG.    SAYS SHE IS BLEEDING NOW-  NO PAD ON IN TRIAGE-    IN PANTS-  NOTHING.    CRAMPS NOW-    LESS THAN EARLIER.   TOOK 400MG   MOTRIN AT  1030AM.

## 2012-12-17 ENCOUNTER — Inpatient Hospital Stay (HOSPITAL_COMMUNITY): Payer: Self-pay

## 2012-12-17 DIAGNOSIS — R109 Unspecified abdominal pain: Secondary | ICD-10-CM

## 2012-12-17 DIAGNOSIS — Z6841 Body Mass Index (BMI) 40.0 and over, adult: Secondary | ICD-10-CM

## 2012-12-17 DIAGNOSIS — D72829 Elevated white blood cell count, unspecified: Secondary | ICD-10-CM

## 2012-12-17 LAB — WET PREP, GENITAL: Yeast Wet Prep HPF POC: NONE SEEN

## 2012-12-21 NOTE — MAU Provider Note (Signed)
Attestation of Attending Supervision of Advanced Practitioner (CNM/NP): Evaluation and management procedures were performed by the Advanced Practitioner under my supervision and collaboration. I have reviewed the Advanced Practitioner's note and chart, and I agree with the management and plan.  LEGGETT,KELLY H. 4:30 PM

## 2013-02-25 ENCOUNTER — Emergency Department (HOSPITAL_COMMUNITY)
Admission: EM | Admit: 2013-02-25 | Discharge: 2013-02-25 | Disposition: A | Discharge status: Home / Self Care | Payer: 59 | Attending: Emergency Medicine | Admitting: Emergency Medicine

## 2013-02-25 ENCOUNTER — Encounter (HOSPITAL_COMMUNITY): Payer: Self-pay | Admitting: Emergency Medicine

## 2013-02-25 DIAGNOSIS — Z792 Long term (current) use of antibiotics: Secondary | ICD-10-CM | POA: Insufficient documentation

## 2013-02-25 DIAGNOSIS — E119 Type 2 diabetes mellitus without complications: Secondary | ICD-10-CM | POA: Insufficient documentation

## 2013-02-25 DIAGNOSIS — K0889 Other specified disorders of teeth and supporting structures: Secondary | ICD-10-CM

## 2013-02-25 DIAGNOSIS — Z8619 Personal history of other infectious and parasitic diseases: Secondary | ICD-10-CM | POA: Insufficient documentation

## 2013-02-25 DIAGNOSIS — Z8719 Personal history of other diseases of the digestive system: Secondary | ICD-10-CM | POA: Insufficient documentation

## 2013-02-25 DIAGNOSIS — K029 Dental caries, unspecified: Secondary | ICD-10-CM | POA: Insufficient documentation

## 2013-02-25 DIAGNOSIS — K089 Disorder of teeth and supporting structures, unspecified: Secondary | ICD-10-CM | POA: Insufficient documentation

## 2013-02-25 DIAGNOSIS — Z880 Allergy status to penicillin: Secondary | ICD-10-CM | POA: Insufficient documentation

## 2013-02-25 MED ORDER — OXYCODONE-ACETAMINOPHEN 5-325 MG PO TABS: 1.0000 | ORAL_TABLET | Freq: Four times a day (QID) | ORAL | Status: DC | PRN

## 2013-02-25 MED ORDER — KETOROLAC TROMETHAMINE 30 MG/ML IJ SOLN
30.0000 mg | Freq: Once | INTRAMUSCULAR | Status: AC
  Administered 2013-02-25: 30 mg via INTRAMUSCULAR
  Filled 2013-02-25: qty 1

## 2013-02-25 MED ORDER — CLINDAMYCIN HCL 150 MG PO CAPS: 300.0000 mg | ORAL_CAPSULE | Freq: Three times a day (TID) | ORAL | Status: DC

## 2013-02-25 NOTE — ED Provider Notes (Signed)
CSN: [id number]     Arrival date & time 02/25/13  0555 History   First MD Initiated Contact with Patient 02/25/13 0636     Chief Complaint  Patient presents with  . Dental Pain   (Consider location/radiation/quality/duration/timing/severity/associated sxs/prior Treatment) HPI Comments: Patient is a 27 year old female who presents for right upper dental pain, worsening x3 days. Patient states that part of her tooth broke off 2 month ago which she believes is the cause of her pain today. Pain is throbbing and radiates to her right ear. She has been taking ibuprofen, Aleve, and using heat and ice packs were symptoms without relief. She states the pain is aggravated with palpation and eating. Patient denies associated fever, inability to swallow, drooling, neck pain or stiffness, ear discharge, inability to open her jaw and shortness of breath.  The history is provided by the patient. No language interpreter was used.    Past Medical History  Diagnosis Date  . Diabetes mellitus   . Ectopic pregnancy   . H. pylori infection   . GERD (gastroesophageal reflux disease)   . Diabetes mellitus   . Morbid obesity with BMI of 50.0-59.9, adult 10/05/2011   Past Surgical History  Procedure Laterality Date  . No past surgeries     Family History  Problem Relation Age of Onset  . Arthritis Mother   . Asthma Mother   . COPD Mother   . Depression Mother   . Diabetes Mother   . Hypertension Mother   . Mental illness Mother   . Kidney disease Mother   . Crohn's disease Mother   . Mental illness Sister   . Arthritis Maternal Aunt   . Asthma Maternal Aunt   . COPD Maternal Aunt   . Depression Maternal Aunt   . Diabetes Maternal Aunt   . Hypertension Maternal Aunt   . Mental illness Maternal Aunt   . Stroke Maternal Aunt   . Heart failure Maternal Aunt   . Heart failure Other    History  Substance Use Topics  . Smoking status: Never Smoker   . Smokeless tobacco: Never Used  . Alcohol  Use: No   OB History   Grav Para Term Preterm Abortions TAB SAB Ect Mult Living   1    1   1   0     Review of Systems  Constitutional: Negative for fever.  HENT: Positive for dental problem. Negative for drooling and trouble swallowing.   Respiratory: Negative for shortness of breath.   Musculoskeletal: Negative for neck pain and neck stiffness.  All other systems reviewed and are negative.    Allergies  Penicillins  Home Medications   Current Outpatient Rx  Name  Route  Sig  Dispense  Refill  . clindamycin (CLEOCIN) 150 MG capsule   Oral   Take 2 capsules (300 mg total) by mouth 3 (three) times daily. May dispense as 150mg  capsules   60 capsule   0   . oxyCODONE-acetaminophen (PERCOCET/ROXICET) 5-325 MG per tablet   Oral   Take 1 tablet by mouth every 6 (six) hours as needed for pain.   13 tablet   0    BP 146/107  Pulse 88  Temp(Src) 98.2 F (36.8 C) (Oral)  Resp 18  SpO2 99%  LMP 02/11/2013  Physical Exam  Nursing note and vitals reviewed. Constitutional: She is oriented to person, place, and time. She appears well-developed and well-nourished. No distress.  HENT:  Head: Normocephalic and atraumatic.  Right Ear:  Tympanic membrane, external ear and ear canal normal. No mastoid tenderness.  Left Ear: Tympanic membrane, external ear and ear canal normal. No mastoid tenderness.  Nose: Nose normal.  Mouth/Throat: Uvula is midline, oropharynx is clear and moist and mucous membranes are normal. No oral lesions. No trismus in the jaw. Abnormal dentition. Dental caries present. No uvula swelling.    TTP of R upper 1st and 2nd molar. Dental fracture of 2nd molar appreciated. Mild gingival swelling and erythema. No area of fluctuance. No oral bleeding or lesions. No trismus. Uvula midline and patient tolerating secretions without difficulty.  Eyes: Conjunctivae and EOM are normal. Pupils are equal, round, and reactive to light. No scleral icterus.  Neck: Normal range  of motion. Neck supple.  Cardiovascular: Normal rate, regular rhythm and normal heart sounds.   Pulmonary/Chest: Effort normal. No stridor. No respiratory distress. She has no wheezes. She has no rales.  Musculoskeletal: Normal range of motion.  Neurological: She is alert and oriented to person, place, and time.  Skin: Skin is warm and dry. No rash noted. She is not diaphoretic. No erythema. No pallor.  Psychiatric: She has a normal mood and affect. Her behavior is normal.    ED Course  Procedures (including critical care time) Labs Review Labs Reviewed - No data to display Imaging Review No results found.  EKG Interpretation   None       MDM   1. Dentalgia    27 year old female presents for dentalgia x3 days. Patient is well and nontoxic appearing, hemodynamically stable, and afebrile. Uvula midline without evidence of peritonsillar abscess. There is no trismus or stridor. Tenderness to palpation of the right upper first and second molars appreciated with dental fracture. No area of fluctuance to suspect drainable dental abscess. No red flags or signs concerning for Ludwig's angina. Patient appropriate for discharge today with dental followup. Have given low-cost dental resources in the area as well as referral to dentist on call. Patient will be prescribed clindamycin to cover for infection. Short course of Percocet given for pain control. Patient reliable for followup in stable for discharge. Return precautions discussed inpatient agreeable to plan with no unaddressed concerns.    Antonietta Breach, [place] 02/25/13 [phone]

## 2013-02-25 NOTE — ED Notes (Signed)
Two months ago pt broke off a tooth on the upper right and then 3 days ago started having a lot of pain and OTC Med have not helped.

## 2013-02-25 NOTE — ED Provider Notes (Signed)
Medical screening examination/treatment/procedure(s) were performed by non-physician practitioner and as supervising physician I was immediately available for consultation/collaboration.   Elyn Peers, MD 02/25/13 [phone]

## 2013-02-26 NOTE — Care Management Note (Signed)
    Page 1 of 1   02/25/2013     8:09:30 AM   CARE MANAGEMENT NOTE 02/25/2013  Patient:  Sheila Austin, Sheila Austin   Account Number:  [id number]  Date Initiated:  02/25/2013  Documentation initiated by:  Lorne Skeens  Subjective/Objective Assessment:   Patient seen in ED with dental abscess     Action/Plan:   Discharge medication needs   Anticipated DC Date:  02/25/2013   Anticipated DC Plan:  [place]  CM consult  [place] [facility]      Choice offered to / List presented to:             Status of service:   Medicare Important Message given?   (If response is "NO", the following Medicare IM given date fields will be blank) Date Medicare IM given:   Date Additional Medicare IM given:    Discharge Disposition:    Per UR Regulation:    If discussed at Long Length of Stay Meetings, dates discussed:    Comments:  02/25/13 [place] RN, MSN, CM- On call CM recieved call requesting MATCH for antibiotics.  CM was unable to log onto [facility] system at the time of call.  MATCH was completed at 0730, but patient had already left the ED. CM called patient at home, using her mother's contact information as patient's phone was not in service.  [place] letter was left at ED registration desk for patient. Patient verbalized understanding and states she will bring photo ID with her to pick up [facility] letter.

## 2013-03-25 ENCOUNTER — Inpatient Hospital Stay (HOSPITAL_COMMUNITY)
Admission: AD | Admit: 2013-03-25 | Discharge: 2013-03-26 | Disposition: A | Discharge status: Home / Self Care | Payer: 59 | Source: Ambulatory Visit | Attending: Obstetrics & Gynecology | Admitting: Obstetrics & Gynecology

## 2013-03-25 ENCOUNTER — Encounter (HOSPITAL_COMMUNITY): Payer: Self-pay

## 2013-03-25 DIAGNOSIS — E1165 Type 2 diabetes mellitus with hyperglycemia: Secondary | ICD-10-CM | POA: Insufficient documentation

## 2013-03-25 DIAGNOSIS — IMO0002 Reserved for concepts with insufficient information to code with codable children: Secondary | ICD-10-CM | POA: Insufficient documentation

## 2013-03-25 DIAGNOSIS — B373 Candidiasis of vulva and vagina: Secondary | ICD-10-CM | POA: Insufficient documentation

## 2013-03-25 DIAGNOSIS — L293 Anogenital pruritus, unspecified: Secondary | ICD-10-CM | POA: Insufficient documentation

## 2013-03-25 DIAGNOSIS — B3731 Acute candidiasis of vulva and vagina: Secondary | ICD-10-CM | POA: Insufficient documentation

## 2013-03-25 HISTORY — DX: Essential (primary) hypertension: I10

## 2013-03-25 LAB — URINE MICROSCOPIC-ADD ON

## 2013-03-25 LAB — URINALYSIS, ROUTINE W REFLEX MICROSCOPIC
Ketones, ur: NEGATIVE mg/dL
Leukocytes, UA: NEGATIVE
Nitrite: NEGATIVE
Protein, ur: NEGATIVE mg/dL
pH: 5.5 (ref 5.0–8.0)

## 2013-03-25 LAB — WET PREP, GENITAL

## 2013-03-25 MED ORDER — FLUCONAZOLE 150 MG PO TABS: 150.0000 mg | ORAL_TABLET | Freq: Every day | ORAL | Status: AC

## 2013-03-25 MED ORDER — METFORMIN HCL 500 MG PO TABS: 1000.0000 mg | ORAL_TABLET | Freq: Two times a day (BID) | ORAL | Status: DC

## 2013-03-25 MED ORDER — FLUCONAZOLE 200 MG PO TABS
200.0000 mg | ORAL_TABLET | Freq: Once | ORAL | Status: AC
  Administered 2013-03-25: 200 mg via ORAL
  Filled 2013-03-25: qty 1

## 2013-03-25 MED ORDER — METFORMIN HCL 500 MG PO TABS
1000.0000 mg | ORAL_TABLET | Freq: Once | ORAL | Status: AC
  Administered 2013-03-25: 1000 mg via ORAL
  Filled 2013-03-25: qty 2

## 2013-03-25 MED ORDER — NYSTATIN-TRIAMCINOLONE 100000-0.1 UNIT/GM-% EX CREA
TOPICAL_CREAM | Freq: Once | CUTANEOUS | Status: AC
  Administered 2013-03-25: 22:00:00 via TOPICAL
  Filled 2013-03-25: qty 15

## 2013-03-25 NOTE — MAU Note (Signed)
Pt states that she has recently changed soap and has also been recently using a summer's eve spray.

## 2013-03-25 NOTE — MAU Note (Signed)
Pt unsure of LMP, hx of irregular periods.  Reports vaginal itching and burning x 2 wks.

## 2013-03-25 NOTE — MAU Provider Note (Signed)
Chief Complaint: Vaginal Itching  First Provider Initiated Contact with Patient 03/25/13 2143     SUBJECTIVE HPI: Sheila Austin is a 27 y.o. G25P0010 female who presents with vulvar and vaginal itching and burning x2 weeks. Reports white vaginal discharge without odor. Denies vaginal bleeding, fever, chills, abdominal pain.   Diagnosed with diabetes 4-5 years ago. Most recently on metformin and insulin, but stopped approximately one year ago due to losing her insurance. Denies polydipsia, polyuria, unintentional weight loss or mental status changes.  Past Medical History  Diagnosis Date  . Diabetes mellitus   . Ectopic pregnancy   . H. pylori infection   . GERD (gastroesophageal reflux disease)   . Diabetes mellitus   . Morbid obesity with BMI of 50.0-59.9, adult 10/05/2011  . Essential hypertension, benign 11/01/2008   OB History  Gravida Para Term Preterm AB SAB TAB Ectopic Multiple Living  1    1   1   0    # Outcome Date GA Lbr Len/2nd Weight Sex Delivery Anes PTL Lv  1 ECT              Past Surgical History  Procedure Laterality Date  . No past surgeries     History   Social History  . Marital Status: Single    Spouse Name: N/A    Number of Children: N/A  . Years of Education: N/A   Occupational History  . Not on file.   Social History Main Topics  . Smoking status: Never Smoker   . Smokeless tobacco: Never Used  . Alcohol Use: No  . Drug Use: No  . Sexual Activity: Yes    Partners: Male    Birth Control/ Protection: Condom     Comment: Pt is interested in taking BCP   Other Topics Concern  . 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   No current facility-administered medications on file prior to encounter.   Current Outpatient Prescriptions on File Prior to Encounter  Medication Sig Dispense Refill  . clindamycin (CLEOCIN) 150 MG capsule Take 2 capsules (300 mg total) by mouth 3 (three)  times daily. May dispense as 150mg  capsules  60 capsule  0  . oxyCODONE-acetaminophen (PERCOCET/ROXICET) 5-325 MG per tablet Take 1 tablet by mouth every 6 (six) hours as needed for pain.  13 tablet  0   Allergies  Allergen Reactions  . Penicillins Anaphylaxis    ROS: Pertinent items in HPI  OBJECTIVE Blood pressure 121/94, pulse 107, temperature 98.8 F (37.1 C), temperature source Oral, resp. rate 18, height 5\' 2"  (1.575 m), weight 124.739 kg (275 lb), last menstrual period 02/11/2013. GENERAL: Well-developed, well-nourished, obese female in no acute distress.  HEENT: Normocephalic HEART: normal rate RESP: normal effort ABDOMEN: Soft, non-tender NEURO: Alert and oriented SPECULUM EXAM: NEFG except for erythematous, flat rash with excoriation and satellite lesions extending from clitoral hood to perianal area and bilateral labia majora, moderate amount of odorless, white, curd-like discharge mixed with small amount of watery discharge, no blood noted, cervix clean BIMANUAL: cervix closed; unable to assess uterine size due to body habitus, no adnexal tenderness or masses. No cervical motion tenderness.  LAB RESULTS Results for orders placed during the hospital encounter of 03/25/13 (from the past 24 hour(s))  URINALYSIS, ROUTINE W REFLEX MICROSCOPIC     Status: Abnormal   Collection Time    03/25/13  9:15 PM      Result Value Range   Color, Urine YELLOW  YELLOW   APPearance CLEAR  CLEAR   Specific Gravity, Urine 1.020  1.005 - 1.030   pH 5.5  5.0 - 8.0   Glucose, UA >1000 (*) NEGATIVE mg/dL   Hgb urine dipstick NEGATIVE  NEGATIVE   Bilirubin Urine NEGATIVE  NEGATIVE   Ketones, ur NEGATIVE  NEGATIVE mg/dL   Protein, ur NEGATIVE  NEGATIVE mg/dL   Urobilinogen, UA 0.2  0.0 - 1.0 mg/dL   Nitrite NEGATIVE  NEGATIVE   Leukocytes, UA NEGATIVE  NEGATIVE  URINE MICROSCOPIC-ADD ON     Status: None   Collection Time    03/25/13  9:15 PM      Result Value Range   Squamous Epithelial /  LPF RARE  RARE   WBC, UA 0-2  <3 WBC/hpf   RBC / HPF 0-2  <3 RBC/hpf   Bacteria, UA RARE  RARE  POCT PREGNANCY, URINE     Status: None   Collection Time    03/25/13  9:23 PM      Result Value Range   Preg Test, Ur NEGATIVE  NEGATIVE  WET PREP, GENITAL     Status: Abnormal   Collection Time    03/25/13  9:50 PM      Result Value Range   Yeast Wet Prep HPF POC RARE (*) NONE SEEN   Trich, Wet Prep NONE SEEN  NONE SEEN   Clue Cells Wet Prep HPF POC NONE SEEN  NONE SEEN   WBC, Wet Prep HPF POC MODERATE (*) NONE SEEN  GLUCOSE, CAPILLARY     Status: Abnormal   Collection Time    03/25/13 10:27 PM      Result Value Range   Glucose-Capillary 333 (*) 70 - 99 mg/dL    IMAGING No results found.  MAU COURSE Metformin, Diflucan and Mycolog given in maternity admissions. Notified Dr. Elonda Husky CBG, noncompliance with diabetes medications and no current primary care provider or endocrinologist. Okay to discharge patient home, but needs to arrange followup for diabetes management as soon as possible. Discussed primary care options for uninsured patients including applying for financial assistance for [facility]. Patient would like to return there.   Lengthy discussion with patient about the dangers of uncontrolled diabetes. Multiple handouts given. Will only start her back on metformin for now since she does not have testing supplies. States she may be able to check occasional blood sugars on her mother's meter until she can get an appointment, but verbalized understanding that she needs her own meter.   ASSESSMENT 1. Vulvovaginal candidiasis   2. Uncontrolled diabetes mellitus    PLAN Discharge home In stable condition per consult with Dr. Elonda Husky. Start diabetic diet.  No sexual intercourse until VVC has healed.  Explained relationship between yeast infections and hyperglycemia. Hemoglobin A1c pending.     Follow-up Information   Schedule an appointment as soon as possible for a  visit with [facility]. (for diabetes management)    Contact information:   [place] [address]       Follow up with [facility]. (As needed in emergencies)    Contact information:   [place] [phone]       Follow up with [facility]. (For OB/GYN in emergencies)    Contact information:   [id number] [place] [address] [phone]  Medication List    STOP taking these medications       nystatin cream  Commonly known as:  MYCOSTATIN      TAKE these medications       clindamycin 150 MG capsule  Commonly known as:  CLEOCIN  Take 2 capsules (300 mg total) by mouth 3 (three) times daily. May dispense as 150mg  capsules     fluconazole 150 MG tablet  Commonly known as:  DIFLUCAN  Take 1 tablet (150 mg total) by mouth daily. Take 1 tablet daily for a week, then 1 tablet twice a week as needed for recurrent yeast infections.     metFORMIN 500 MG tablet  Commonly known as:  GLUCOPHAGE  Take 2 tablets (1,000 mg total) by mouth 2 (two) times daily with a meal.     oxyCODONE-acetaminophen 5-325 MG per tablet  Commonly known as:  PERCOCET/ROXICET  Take 1 tablet by mouth every 6 (six) hours as needed for pain.       [place], [place] 03/25/2013  11:47 PM

## 2013-03-26 LAB — GC/CHLAMYDIA PROBE AMP: GC Probe RNA: NEGATIVE

## 2013-05-07 HISTORY — PX: COLONOSCOPY: SHX174

## 2013-10-05 SURGERY — Surgical Case
Anesthesia: *Unknown

## 2013-11-02 ENCOUNTER — Telehealth: Payer: Self-pay | Admitting: Gastroenterology

## 2013-11-02 NOTE — Telephone Encounter (Signed)
Left message for pt to call back  °

## 2013-11-03 NOTE — Telephone Encounter (Signed)
Pt states she would like to switch care from Dr. Deatra Ina to Dr. Carlean Purl. States Dr. Carlean Purl sees her mother. Dr. Deatra Ina will you approve the switch? Please advise.

## 2013-11-09 NOTE — Telephone Encounter (Signed)
Dr. Carlean Purl will you accept this pt? Please advise, states you see her mother.

## 2013-11-09 NOTE — Telephone Encounter (Signed)
Pt states she has been having diarrhea and lower abdominal pain for several weeks. Pt scheduled to see Dr. Carlean Purl 12/07/13@4pm . Pt aware of appt.

## 2013-11-09 NOTE — Telephone Encounter (Signed)
Sure - what is her problem and urgency of need for visit? Doubt I can see her until August

## 2013-11-09 NOTE — Telephone Encounter (Signed)
yes

## 2013-11-19 ENCOUNTER — Encounter: Payer: Self-pay | Admitting: Gastroenterology

## 2013-12-07 ENCOUNTER — Ambulatory Visit (INDEPENDENT_AMBULATORY_CARE_PROVIDER_SITE_OTHER): Payer: 59 | Admitting: Family Medicine

## 2013-12-07 ENCOUNTER — Encounter: Payer: Self-pay | Admitting: Internal Medicine

## 2013-12-07 ENCOUNTER — Ambulatory Visit (INDEPENDENT_AMBULATORY_CARE_PROVIDER_SITE_OTHER): Payer: 59 | Admitting: Internal Medicine

## 2013-12-07 VITALS — BP 110/70 | HR 104 | Ht 63.0 in | Wt 270.5 lb

## 2013-12-07 VITALS — BP 120/86 | HR 99 | Temp 99.0°F | Resp 18 | Ht 62.0 in | Wt 271.0 lb

## 2013-12-07 DIAGNOSIS — G43701 Chronic migraine without aura, not intractable, with status migrainosus: Secondary | ICD-10-CM

## 2013-12-07 DIAGNOSIS — E1165 Type 2 diabetes mellitus with hyperglycemia: Secondary | ICD-10-CM

## 2013-12-07 DIAGNOSIS — L989 Disorder of the skin and subcutaneous tissue, unspecified: Secondary | ICD-10-CM

## 2013-12-07 DIAGNOSIS — G8929 Other chronic pain: Secondary | ICD-10-CM | POA: Insufficient documentation

## 2013-12-07 DIAGNOSIS — E1169 Type 2 diabetes mellitus with other specified complication: Secondary | ICD-10-CM

## 2013-12-07 DIAGNOSIS — R1032 Left lower quadrant pain: Secondary | ICD-10-CM

## 2013-12-07 DIAGNOSIS — K3184 Gastroparesis: Secondary | ICD-10-CM

## 2013-12-07 DIAGNOSIS — K529 Noninfective gastroenteritis and colitis, unspecified: Secondary | ICD-10-CM

## 2013-12-07 DIAGNOSIS — R197 Diarrhea, unspecified: Secondary | ICD-10-CM

## 2013-12-07 DIAGNOSIS — E119 Type 2 diabetes mellitus without complications: Secondary | ICD-10-CM

## 2013-12-07 DIAGNOSIS — IMO0002 Reserved for concepts with insufficient information to code with codable children: Secondary | ICD-10-CM

## 2013-12-07 DIAGNOSIS — E669 Obesity, unspecified: Secondary | ICD-10-CM

## 2013-12-07 DIAGNOSIS — IMO0001 Reserved for inherently not codable concepts without codable children: Secondary | ICD-10-CM

## 2013-12-07 DIAGNOSIS — R1031 Right lower quadrant pain: Secondary | ICD-10-CM

## 2013-12-07 LAB — POCT CBC
GRANULOCYTE PERCENT: 63 % (ref 37–80)
HCT, POC: 45.3 % (ref 37.7–47.9)
HEMOGLOBIN: 15.1 g/dL (ref 12.2–16.2)
Lymph, poc: 3.8 — AB (ref 0.6–3.4)
MCH: 29.4 pg (ref 27–31.2)
MCHC: 33.4 g/dL (ref 31.8–35.4)
MCV: 87.9 fL (ref 80–97)
MID (CBC): 0.6 (ref 0–0.9)
MPV: 9 fL (ref 0–99.8)
PLATELET COUNT, POC: 262 10*3/uL (ref 142–424)
POC Granulocyte: 7.4 — AB (ref 2–6.9)
POC LYMPH PERCENT: 32.3 %L (ref 10–50)
POC MID %: 4.7 %M (ref 0–12)
RBC: 5.15 M/uL (ref 4.04–5.48)
RDW, POC: 12.7 %
WBC: 11.8 10*3/uL — AB (ref 4.6–10.2)

## 2013-12-07 LAB — POCT GLYCOSYLATED HEMOGLOBIN (HGB A1C): HEMOGLOBIN A1C: 13.1

## 2013-12-07 LAB — GLUCOSE, POCT (MANUAL RESULT ENTRY): POC Glucose: 327 mg/dl — AB (ref 70–99)

## 2013-12-07 MED ORDER — DIPHENOXYLATE-ATROPINE 2.5-0.025 MG PO TABS: 1.0000 | ORAL_TABLET | Freq: Four times a day (QID) | ORAL | Status: DC | PRN

## 2013-12-07 MED ORDER — INSULIN GLARGINE 100 UNIT/ML SOLOSTAR PEN: 20.0000 [IU] | PEN_INJECTOR | Freq: Every day | SUBCUTANEOUS | Status: DC

## 2013-12-07 MED ORDER — POLYETHYLENE GLYCOL 3350 17 GM/SCOOP PO POWD: 1.0000 | Freq: Once | ORAL | Status: DC

## 2013-12-07 NOTE — Assessment & Plan Note (Signed)
Lomotil prn Await colonoscopy

## 2013-12-07 NOTE — Patient Instructions (Addendum)
  Today you have been given a printed Lomotil rx to take to the pharmacy.   You have been scheduled for a colonoscopy. Please follow written instructions given to you at your visit today.  Please pick up your prep supplies at the pharmacy. If you use inhalers (even only as needed), please bring them with you on the day of your procedure. Your physician has requested that you go to www.startemmi.com and enter the access code given to you at your visit today. This web site gives a general overview about your procedure. However, you should still follow specific instructions given to you by our office regarding your preparation for the procedure.  If you start oral diabetic medicine DO NOT TAKE IT THE MORNING OF YOUR PROCEDURE.  Today when you leave here please go to the [facility]. They are located at [address] , phone # [phone].  I appreciate the opportunity to care for you.

## 2013-12-07 NOTE — Progress Notes (Signed)
Subjective:    Patient ID: Sheila Austin, female    DOB: [birth year], 28 y.o.   MRN: [id number]  HPI The patient is a young white woman here for evaluation of chronic diarrhea and abdominal pain. Her mother is a patient of mine, with Crohn's disease. Her GI history is notable for a diagnosis of gastroparesis, she has diabetes mellitus and has been on therapy before with Lantus and metformin but has been off for some time now. Her hemoglobin A1c was 12% last fall.  She is describing worsening diarrhea throughout the day, and night, associated with crampy abdominal pain when she has frequent stools she will have streaks of red blood when wiping. There is some anal discomfort. She has intermittent nausea and sometimes vomiting as well. She has not any therapy for this this time. She also has heartburn and indigestion symptoms. She has never had a colonoscopy.  Allergies  Allergen Reactions  . Penicillins Anaphylaxis   Outpatient Prescriptions Prior to Visit  Medication Sig Dispense Refill  . clindamycin (CLEOCIN) 150 MG capsule Take 2 capsules (300 mg total) by mouth 3 (three) times daily. May dispense as 150mg  capsules  60 capsule  0  . metFORMIN (GLUCOPHAGE) 500 MG tablet Take 2 tablets (1,000 mg total) by mouth 2 (two) times daily with a meal.  60 tablet  11  . oxyCODONE-acetaminophen (PERCOCET/ROXICET) 5-325 MG per tablet Take 1 tablet by mouth every 6 (six) hours as needed for pain.  13 tablet  0   No facility-administered medications prior to visit.   Past Medical History  Diagnosis Date  . Diabetes mellitus   . Ectopic pregnancy   . H. pylori infection   . GERD (gastroesophageal reflux disease)   . Morbid obesity with BMI of 50.0-59.9, adult 10/05/2011  . Essential hypertension, benign 11/01/2008  . E. coli sepsis   . Gastroparesis   . Pyelonephritis   . Pulmonary edema   . Fatty liver   . Ureteral obstruction   . Migraine    Past Surgical History  Procedure Laterality  Date  . Esophagogastroduodenoscopy  2010   History   Social History  . Marital Status: Single    Spouse Name: N/A    Number of Children: 0  . Years of Education: CNA   Occupational History  . administrative assist [employer]   Social History Main Topics  . Smoking status: Never Smoker   . Smokeless tobacco: Never Used  . Alcohol Use: Yes     Comment: occasional  . Drug Use: No  . Sexual Activity: Yes    Partners: Male    Birth Control/ Protection: Condom     Comment: Pt is interested in taking BCP   Other Topics Concern  . 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 Arthritis Mother   . Asthma Mother   . COPD Mother   . Depression Mother   . Diabetes Mother   . Hypertension Mother   . Mental illness Mother   . Kidney disease Mother   . Crohn's disease Mother   . Mental illness Sister   . Arthritis Maternal Aunt   . Asthma Maternal Aunt   . COPD Maternal Aunt   . Depression Maternal Aunt   . Diabetes Maternal Aunt   . Hypertension Maternal Aunt   .  Mental illness Maternal Aunt   . Stroke Maternal Aunt   . Heart failure Maternal Aunt   . Heart failure Maternal Grandfather     Review of Systems Positive for headaches, chronic skin rash in the right lower extremity, polyuria polydipsia. All other review of systems negative or as per history of present illness.    Objective:   Physical Exam General:  Well-developed, well-nourished and in no acute distress - obese Eyes:  anicteric. Neck:   supple w/o thyromegaly or mass.  Lungs: Clear to auscultation bilaterally. Heart:  S1S2, no rubs, murmurs, gallops. Abdomen:  obese, soft, non-tender, no hepatosplenomegaly, hernia, or mass and BS+.  Rectal: deferred Lymph:  no cervical or supraclavicular adenopathy. Extremities:   no edema Skin   RLE with papular hyperpigmented rash in popliteal  fossa and anterior upper leg with semicircular rash - chronic hyperpigmentation mid anterior right leg Neuro:  A&O x 3.  Psych:  appropriate mood and  Affect.   Data Reviewed: Labs, records in EMR from past years 2010-2014    Assessment & Plan:  Diabetes mellitus out of control I've explained that this is her single most important helped her at this time. She is not on therapy in some time, I have advised her to go to urgent medical and family care clinic today and be seen. They can check her hemoglobin A1c, comprehensive metabolic panel and CBC and any other labs they see fit. She likely needs a TSH and lipids at some point also.  Chronic diarrhea Cause of this is not clear, could be IBS but her mother has severe Crohn's disease so I am wondering if that is not so. Colonoscopy will be scheduled.The risks and benefits as well as alternatives of endoscopic procedure(s) have been discussed and reviewed. All questions answered. The patient agrees to proceed.   Lomotil when necessary as prescribed  Chronic bilateral lower abdominal pain Lomotil prn Await colonoscopy  Skin lesions - right lower extremity Chronic rash - not very symptomatic - probably not related to GI sxs

## 2013-12-07 NOTE — Assessment & Plan Note (Addendum)
I've explained that this is her single most important helped her at this time. She is not on therapy in some time, I have advised her to go to urgent medical and family care clinic today and be seen. They can check her hemoglobin A1c, comprehensive metabolic panel and CBC and any other labs they see fit. She likely needs a TSH and lipids at some point also.

## 2013-12-07 NOTE — Assessment & Plan Note (Signed)
Cause of this is not clear, could be IBS but her mother has severe Crohn's disease so I am wondering if that is not so. Colonoscopy will be scheduled.The risks and benefits as well as alternatives of endoscopic procedure(s) have been discussed and reviewed. All questions answered. The patient agrees to proceed.   Lomotil when necessary as prescribed

## 2013-12-07 NOTE — Patient Instructions (Signed)
Please start lantus insulin 20 units at bedtime. Check your blood sugar in the morning, if greater than 100, increase lantus by 2 units We will call you with your specialist appointments, if your appointment is greater than 1 week from today, please come in over the weekend to be checked.

## 2013-12-07 NOTE — Assessment & Plan Note (Signed)
Chronic rash - not very symptomatic - probably not related to GI sxs

## 2013-12-07 NOTE — Progress Notes (Signed)
Subjective:    Patient ID: Sheila Austin, female    DOB: 02/28/1986, 28 y.o.   MRN: [id number]  HPI This is a pleasant 28 yo female who is accompanied by her mother and her husband. She was seen by her gastroenterologist, Silvano Rusk, today who suggested she be seen here tonight for her diabetes. She has been off all her medications for several years due to loss of insurance. Prior to losing her insurance, she was on lantus/novolog/metformin. She did not feel like the metformin helped. She went to diabetes education while an inpatient in 2013, but did not have any outpatient education follow up.  She is currently having polyuria and polydipsia. She does not have any way to monitor her blood sugar at home.   She has a long history of migraines. Currently has about 3x/ month. Lasts for several days. Takes naproxen with eventual relief. Has light sensitivity. No aura.   She was seen in GI today for chronic diarrhea and is scheduled for a colonoscopy later in the month.  She is married and sexually active, not using birth control, has been trying for years to get pregnant.   Last CPE- unknown. She is interested in establishing care with someone.  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 She enjoys her job.   Past Medical History  Diagnosis Date  . Diabetes mellitus   . Ectopic pregnancy   . H. pylori infection   . GERD (gastroesophageal reflux disease)   . Morbid obesity with BMI of 50.0-59.9, adult 10/05/2011  . Essential hypertension, benign 11/01/2008  . E. coli sepsis   . Gastroparesis   . Pyelonephritis   . Pulmonary edema   . Fatty liver   . Ureteral obstruction   . Migraine   . Helicobacter pylori gastritis 09/07/2011   Past Surgical History  Procedure Laterality Date  . Esophagogastroduodenoscopy  2010   Family History  Problem Relation Age of Onset  . Arthritis Mother   . Asthma Mother   . COPD Mother   . Depression Mother   . Diabetes Mother   .  Hypertension Mother   . Mental illness Mother   . Kidney disease Mother   . Crohn's disease Mother   . Mental illness Sister   . Arthritis Maternal Aunt   . Asthma Maternal Aunt   . COPD Maternal Aunt   . Depression Maternal Aunt   . Diabetes Maternal Aunt   . Hypertension Maternal Aunt   . Mental illness Maternal Aunt   . Stroke Maternal Aunt   . Heart failure Maternal Aunt   . Heart failure Maternal Grandfather    History  Substance Use Topics  . Smoking status: Never Smoker   . Smokeless tobacco: Never Used  . Alcohol Use: Yes     Comment: occasional     Review of Systems  Constitutional: Negative for unexpected weight change.  Respiratory: Negative for cough and shortness of breath.   Cardiovascular: Negative for chest pain, palpitations and leg swelling.  Gastrointestinal: Positive for nausea, vomiting, abdominal pain, diarrhea and abdominal distention.  Endocrine: Positive for polydipsia and polyuria.  Genitourinary: Negative for dysuria, hematuria and difficulty urinating.  Neurological: Positive for headaches.   Diarrhea, abdominal cramping, urinary frequency, no dysuria, no hematuria,     Objective:   Physical Exam  Vitals reviewed. Constitutional: She is oriented to person, place, and time. She appears well-developed and well-nourished.  HENT:  Head: Normocephalic and atraumatic.  Right Ear: External ear normal.  Left Ear: External ear normal.  Eyes: Conjunctivae and EOM are normal.  Neck: Normal range of motion. Neck supple.  Cardiovascular: Normal rate, regular rhythm, normal heart sounds and intact distal pulses.   Pulmonary/Chest: Effort normal and breath sounds normal.  Musculoskeletal: Normal range of motion.  Neurological: She is alert and oriented to person, place, and time.  Skin: Skin is warm and dry.  Psychiatric: She has a normal mood and affect. Her behavior is normal. Judgment and thought content normal.   Glucose- 327 HgBA1C- 13.1      Assessment & Plan:  Spoke with Dr. Brigitte Pulse regarding this patient  1. Diarrhea - Colonoscopy as scheduled - POCT CBC - POCT glucose (manual entry) - POCT glycosylated hemoglobin (Hb A1C) - Comprehensive metabolic panel - TSH  2. Diabetes mellitus type 2 in obese -discussed serious nature of patient's uncontrolled DM and need for immediate restart of insulin as well as improved dietary measures- no juice/no soda/limit carbohydrates/increase lean protein and vegetables - POCT CBC - POCT glucose (manual entry) - POCT glycosylated hemoglobin (Hb A1C) - Comprehensive metabolic panel - TSH - Insulin Glargine (LANTUS SOLOSTAR) 100 UNIT/ML Solostar Pen; Inject 20 Units into the skin daily at 10 pm. Check blood sugar in the morning and if greater than 100 in the morning, increase insulin by 2 units.  Dispense: 5 pen; Refill: PRN - Ambulatory referral to Endocrinology  3. Chronic migraine without aura with status migrainosus, not intractable -Patient with long history of migraine and her father died in his early 49s of a brain anyerusm.  - Ambulatory referral to Neurology  Will call patient to set up an appointment to establish care at 104.   Elby Beck, FNP-BC  [facility]  12/08/2013 7:15 PM

## 2013-12-08 ENCOUNTER — Telehealth: Payer: Self-pay

## 2013-12-08 LAB — COMPREHENSIVE METABOLIC PANEL
ALBUMIN: 3.9 g/dL (ref 3.5–5.2)
ALT: 11 U/L (ref 0–35)
AST: 11 U/L (ref 0–37)
Alkaline Phosphatase: 50 U/L (ref 39–117)
BILIRUBIN TOTAL: 0.4 mg/dL (ref 0.2–1.2)
BUN: 11 mg/dL (ref 6–23)
CO2: 27 meq/L (ref 19–32)
Calcium: 9.5 mg/dL (ref 8.4–10.5)
Chloride: 96 mEq/L (ref 96–112)
Creat: 0.75 mg/dL (ref 0.50–1.10)
Glucose, Bld: 342 mg/dL — ABNORMAL HIGH (ref 70–99)
Potassium: 4.6 mEq/L (ref 3.5–5.3)
SODIUM: 134 meq/L — AB (ref 135–145)
TOTAL PROTEIN: 7.6 g/dL (ref 6.0–8.3)

## 2013-12-08 LAB — TSH: TSH: 2.264 u[IU]/mL (ref 0.350–4.500)

## 2013-12-08 NOTE — Telephone Encounter (Signed)
Pt says that the Insulin Glargine (LANTUS SOLOSTAR) 100 UNIT/ML Solostar Pen she was prescribed by Dr. Carlean Purl was too expensive would like to know if she could be prescribed just the viles instead, or something

## 2013-12-09 ENCOUNTER — Other Ambulatory Visit: Payer: Self-pay | Admitting: Family Medicine

## 2013-12-09 DIAGNOSIS — IMO0001 Reserved for inherently not codable concepts without codable children: Secondary | ICD-10-CM

## 2013-12-09 DIAGNOSIS — E1165 Type 2 diabetes mellitus with hyperglycemia: Principal | ICD-10-CM

## 2013-12-09 MED ORDER — TRUETRACK BLOOD GLUCOSE DEVI: 1.0000 [IU] | Freq: Once | Status: DC

## 2013-12-09 MED ORDER — INSULIN GLARGINE 100 UNIT/ML ~~LOC~~ SOLN: 20.0000 [IU] | Freq: Every day | SUBCUTANEOUS | Status: DC

## 2013-12-09 MED ORDER — GLUCOSE BLOOD VI STRP: ORAL_STRIP | Status: DC

## 2013-12-09 NOTE — Telephone Encounter (Signed)
Pharmacy would like 1) the lantus, see patients other message;  2) directions for use  3) also, needs needles  [phone]

## 2013-12-10 ENCOUNTER — Telehealth: Payer: Self-pay | Admitting: Family Medicine

## 2013-12-12 ENCOUNTER — Encounter: Payer: Self-pay | Admitting: *Deleted

## 2013-12-15 NOTE — Progress Notes (Signed)
Left a message for patient to return call to schedule an appointment for CPE and establish care.

## 2013-12-17 ENCOUNTER — Ambulatory Visit (INDEPENDENT_AMBULATORY_CARE_PROVIDER_SITE_OTHER): Payer: 59 | Admitting: Neurology

## 2013-12-17 ENCOUNTER — Telehealth: Payer: Self-pay

## 2013-12-17 ENCOUNTER — Encounter: Payer: Self-pay | Admitting: Neurology

## 2013-12-17 VITALS — BP 111/74 | HR 83 | Temp 97.2°F | Ht 63.5 in | Wt 271.0 lb

## 2013-12-17 DIAGNOSIS — R400 Somnolence: Secondary | ICD-10-CM

## 2013-12-17 DIAGNOSIS — G4761 Periodic limb movement disorder: Secondary | ICD-10-CM

## 2013-12-17 DIAGNOSIS — G2581 Restless legs syndrome: Secondary | ICD-10-CM

## 2013-12-17 DIAGNOSIS — G43019 Migraine without aura, intractable, without status migrainosus: Secondary | ICD-10-CM

## 2013-12-17 DIAGNOSIS — Z8249 Family history of ischemic heart disease and other diseases of the circulatory system: Secondary | ICD-10-CM

## 2013-12-17 DIAGNOSIS — R51 Headache: Secondary | ICD-10-CM

## 2013-12-17 DIAGNOSIS — R519 Headache, unspecified: Secondary | ICD-10-CM

## 2013-12-17 DIAGNOSIS — Z823 Family history of stroke: Secondary | ICD-10-CM

## 2013-12-17 DIAGNOSIS — G4710 Hypersomnia, unspecified: Secondary | ICD-10-CM

## 2013-12-17 MED ORDER — ONDANSETRON 4 MG PO TBDP: 4.0000 mg | ORAL_TABLET | Freq: Three times a day (TID) | ORAL | Status: DC | PRN

## 2013-12-17 MED ORDER — BUTALBITAL-APAP-CAFFEINE 50-325-40 MG PO TABS: 1.0000 | ORAL_TABLET | Freq: Four times a day (QID) | ORAL | Status: DC | PRN

## 2013-12-17 NOTE — Telephone Encounter (Signed)
Patient says she missed a call, but no one left a message.

## 2013-12-17 NOTE — Progress Notes (Signed)
Unable to leave a message at this time will try again later.

## 2013-12-17 NOTE — Patient Instructions (Addendum)
Please remember, common headache triggers are: sleep deprivation, dehydration, overheating, stress, hypoglycemia or skipping meals and blood sugar fluctuations, excessive pain medications or excessive alcohol use or caffeine withdrawal. Some people have food triggers such as aged cheese, orange juice or chocolate, especially dark chocolate, or MSG (monosodium glutamate). Try to avoid these headache triggers as much possible. It may be helpful to keep a headache diary to figure out what makes your headaches worse or brings them on and what alleviates them. Some people report headache onset after exercise but studies have shown that regular exercise may actually prevent headaches from coming. If you have exercise-induced headaches, please make sure that you drink plenty of fluid before and after exercising and that you do not over do it and do not overheat.  Based on your symptoms and your exam I believe you are at risk for obstructive sleep apnea or OSA, and I think we should proceed with a sleep study to determine whether you do or do not have OSA and how severe it is. If you have more than mild OSA, I want you to consider treatment with CPAP. Please remember, the risks and ramifications of moderate to severe obstructive sleep apnea or OSA are: Cardiovascular disease, including congestive heart failure, stroke, difficult to control hypertension, arrhythmias, and even type 2 diabetes has been linked to untreated OSA. Sleep apnea causes disruption of sleep and sleep deprivation in most cases, which, in turn, can cause recurrent headaches, problems with memory, mood, concentration, focus, and vigilance. Most people with untreated sleep apnea report excessive daytime sleepiness, which can affect their ability to drive. Please do not drive if you feel sleepy.  I will see you back after your sleep study to go over the test results and where to go from there. We will call you after your sleep study and to set up an  appointment at the time.   For you headache we will try as needed fioricet, but be aware, that it is not for daily use and can be addicting, even though it is not a narcotic.  For nausea, you can take Zofran. We will hold off on a preventative medication and we will do a brain MRI and a sleep study.

## 2013-12-17 NOTE — Progress Notes (Signed)
Subjective:    Patient ID: Sheila Austin is a 28 y.o. female.  HPI    Star Age, MD, PhD [facility] [address], [address], [place] [address]  Dear Sheila Austin,   I saw your patient, [name], upon your kind request in my neurologic clinic today for initial consultation of her migraine headaches. The patient is accompanied by her sister today. As you know, Ms. [name] is a 28 year old right-handed woman with a complex medical history of diabetes, morbid obesity, reflux disease, hypertension, history of sepsis, history of gastroparesis, pyelonephritis, pulmonary edema, fatty liver, ectopic pregnancy, who has had a long-standing history of migraine headaches since age 3. She reports currently a headache frequency of 3/week, mostly on the left, throbbing, behind the eye, associated with nausea, no vomiting, significant photophobia, and phonophobia. Her father had headaches and passed away from a brain hemorrhage from an aneurysm in his 68s.  Abortive treatments tried included OTC NSAIDs, and tylenol and most recently naproxen which eventually does help. She's currently not on any birth control pill and in fact, they have been trying to get pregnant for years. She has never had a sleep study, but snores heavily and does not sleep well and reports morning HAs.  She has also noted RLS symptoms and is known to kick in her sleep and moves her legs all the time. She feels tired during the day and her ESS is 12/24 today.  She has no aura with her migraines. She has never been on prescription medication for her HAs, except Motrin and Naproxen. She has never had a brain scan. She is currently not on birth control, does not smoke, drinks alcohol occasionally and drinks one soda a day.  Her Past Medical History Is Significant For: Past Medical History  Diagnosis Date  . Diabetes mellitus   . Ectopic pregnancy   . H. pylori infection   . GERD  (gastroesophageal reflux disease)   . Morbid obesity with BMI of 50.0-59.9, adult 10/05/2011  . Essential hypertension, benign 11/01/2008  . E. coli sepsis   . Gastroparesis   . Pyelonephritis   . Pulmonary edema   . Fatty liver   . Ureteral obstruction   . Helicobacter pylori gastritis 09/07/2011  . Migraine     Her Past Surgical History Is Significant For: Past Surgical History  Procedure Laterality Date  . Esophagogastroduodenoscopy  2010    Her Family History Is Significant For: Family History  Problem Relation Age of Onset  . Arthritis Mother   . Asthma Mother   . COPD Mother   . Depression Mother   . Diabetes Mother   . Hypertension Mother   . Mental illness Mother   . Kidney disease Mother   . Crohn's disease Mother   . Mental illness Sister   . Arthritis Maternal Aunt   . Asthma Maternal Aunt   . COPD Maternal Aunt   . Depression Maternal Aunt   . Diabetes Maternal Aunt   . Hypertension Maternal Aunt   . Mental illness Maternal Aunt   . Stroke Maternal Aunt   . Heart failure Maternal Aunt   . Heart failure Maternal Grandfather   . Aneurysm Father 52    brain    Her Social History Is Significant For: History   Social History  . Marital Status: Married    Spouse Name: Roderic Palau    Number of Children: 0  . Years of Education: 13   Occupational History  .  administrative assist [employer]   Social History Main Topics  . Smoking status: Never Smoker   . Smokeless tobacco: Never Used  . Alcohol Use: Yes     Comment: occasional  . Drug Use: No  . Sexual Activity: Yes    Partners: Male    Birth Control/ Protection: Condom     Comment: Pt is interested in taking BCP   Other Topics Concern  . 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 Penicillins Anaphylaxis   :   Her Current Medications Are:  Outpatient Encounter Prescriptions as of 12/17/2013  Medication Sig  . Blood Glucose Monitoring Suppl (TRUE TRACK BLOOD GLUCOSE) DEVI 1 Units by Does not apply route once.  . diphenoxylate-atropine (LOMOTIL) 2.5-0.025 MG per tablet Take 1 tablet by mouth 4 (four) times daily as needed for diarrhea or loose stools.  Marland Kitchen glucose blood test strip Use as instructed  . insulin glargine (LANTUS) 100 UNIT/ML injection Inject 0.2 mLs (20 Units total) into the skin at bedtime.  . polyethylene glycol powder (GLYCOLAX/MIRALAX) powder Take 255 g (1 Container total) by mouth once.  :  Review of Systems:  Out of a complete 14 point review of systems, all are reviewed and negative with the exception of these symptoms as listed below:   Review of Systems  Constitutional: Positive for fatigue.  Respiratory:       Snoring  Endocrine:       Increased thirst  Genitourinary:       Diarrhea  Skin: Positive for rash.  Neurological: Positive for dizziness and headaches.       Insomnia,restless legs,forgetfulness  Hematological: Bruises/bleeds easily.  Psychiatric/Behavioral:       Not enough sleep,decreased energy,disinterest in activities    Objective:  Neurologic Exam  Physical Exam Physical Examination:   Filed Vitals:   12/17/13 0851  BP: 111/74  Pulse: 83  Temp: 97.2 F (36.2 C)    General Examination: The patient is a very pleasant 28 y.o. female in no acute distress. She appears well-developed and well-nourished and well groomed. She is markedly obese.  HEENT: Normocephalic, atraumatic, pupils are equal, round and reactive to light and accommodation. Funduscopic exam is normal with sharp disc margins noted. Extraocular tracking is good without limitation to gaze excursion or nystagmus noted. Normal smooth pursuit is noted. Hearing is grossly intact. Tympanic membranes are clear bilaterally. Face is symmetric with normal facial animation and normal facial  sensation. Speech is clear with no dysarthria noted. There is no hypophonia. There is no lip, neck/head, jaw or voice tremor. Neck is supple with full range of passive and active motion. There are no carotid bruits on auscultation. Oropharynx exam reveals: mild mouth dryness, poor dental hygiene and moderate airway crowding, due to larger tongue and larger tonsils and smaller airway entry. Mallampati is class II. Tongue protrudes centrally and palate elevates symmetrically. Tonsils are 2+ in size. Neck size is 15 7/8 inches.   Chest: Clear to auscultation without wheezing, rhonchi or crackles noted.  Heart: S1+S2+0, regular and normal without murmurs, rubs or gallops noted.   Abdomen: Soft, non-tender and non-distended with normal bowel sounds appreciated on auscultation.  Extremities: There is no pitting edema in the distal lower extremities bilaterally. Pedal pulses are intact.  Skin:  Warm and dry without trophic changes noted. There are no varicose veins. She has a C shaped rash on the R shin, which has been there for years.   Musculoskeletal: exam reveals no obvious joint deformities, tenderness or joint swelling or erythema.   Neurologically:  Mental status: The patient is awake, alert and oriented in all 4 spheres. Her immediate and remote memory, attention, language skills and fund of knowledge are appropriate. There is no evidence of aphasia, agnosia, apraxia or anomia. Speech is clear with normal prosody and enunciation. Thought process is linear. Mood is normal and affect is normal.  Cranial nerves II - XII are as described above under HEENT exam. In addition: shoulder shrug is normal with equal shoulder height noted. Motor exam: Normal bulk, strength and tone is noted. There is no drift, tremor or rebound. Romberg is negative. Reflexes are 2+ throughout. Babinski: Toes are flexor bilaterally. Fine motor skills and coordination: intact with normal finger taps, normal hand movements, normal  rapid alternating patting, normal foot taps and normal foot agility.  Cerebellar testing: No dysmetria or intention tremor on finger to nose testing. Heel to shin is unremarkable bilaterally. There is no truncal or gait ataxia.  Sensory exam: intact to light touch, pinprick, vibration, temperature sense in the upper and lower extremities.  Gait, station and balance: She stands easily. No veering to one side is noted. No leaning to one side is noted. Posture is age-appropriate and stance is narrow based. Gait shows normal stride length and normal pace. No problems turning are noted. She turns en bloc. Tandem walk is unremarkable. Intact toe and heel stance is noted.               Assessment and Plan:   In summary, Sheila Austin is a very pleasant 28 y.o.-year old female with a complex medical history of diabetes, morbid obesity, reflux disease, hypertension, history of sepsis, history of gastroparesis, pyelonephritis, pulmonary edema, fatty liver, ectopic pregnancy, who has had a long-standing history of migraine headaches since age 61. She has a history and exam concerning for migraine without aura and also highly suspicious of OSA and she also endorsed RLS symptoms and PLMs.    I had a long chat with the patient and Butch Penny about my findings and the diagnosis of migraine, OSA and RLS and PLMD. We talked about medical treatments and non-pharmacological approaches. We talked about maintaining a healthy lifestyle in general. I encouraged the patient to eat healthy, exercise daily and keep well hydrated, to keep a scheduled bedtime and wake time routine, to not skip any meals and eat healthy snacks in between meals and to have protein with every meal.   I advised the patient about common headache triggers: sleep deprivation, dehydration, overheating, stress, hypoglycemia or skipping meals and blood sugar fluctuations, excessive pain medications or excessive alcohol use or caffeine withdrawal. Some people  have food triggers such as aged cheese, orange juice or chocolate, especially dark chocolate, or MSG (monosodium glutamate). She is to try to avoid these headache triggers as much possible. It may be helpful to keep a headache diary to figure out what makes Her headaches worse or brings them on and what alleviates them. Some people report headache onset after exercise but studies have shown that regular exercise may actually prevent headaches from coming. If She has exercise-induced headaches, She is advised to drink plenty of fluid before and after exercising and that to not overdo it and to not overheat.  As far  as further diagnostic testing is concerned, I suggested the following today: MRI brain w and w/o Gad, and we will do a sleep study. She has a family history of brain aneurysm. As far as medications are concerned, I recommended the following at this time: For abortive treatment I suggested a trial of Fioricet but I did point out to her that it can be addictive even though it is not a narcotic. She cannot use this daily. For as needed use she can also use Zofran for nausea. She is advised that we should consider preventative medications at this time but because she is trying to get pregnant and is not on any birth control at this time I am reluctant to prescribe any preventative. A beta blocker would probably reduce her blood pressure too much. Something like Topamax and amitriptyline is not a good option if she were to get pregnant on those. She is agreeable to try to just be on abortive medication at this time. For the same reason that she is not on any birth control I would not suggest using Imitrex or similar medications. If she has obstructive sleep apnea which seems likely in her case she would benefit from treatment as it could potentially reduce her headache frequency as well.  I answered all their questions today and the patient and her sister were in agreement with the above outlined plan. I  would like to see the patient back after these tests are done. She's encouraged to see a dentist as well. Thank you very much for allowing me to participate in the care of this nice patient. If I can be of any further assistance to you please do not hesitate to call me at [phone].  Sincerely,   Star Age, MD, PhD

## 2013-12-18 NOTE — Telephone Encounter (Signed)
No notes for a phone call made.

## 2013-12-31 ENCOUNTER — Ambulatory Visit (INDEPENDENT_AMBULATORY_CARE_PROVIDER_SITE_OTHER): Payer: 59

## 2013-12-31 DIAGNOSIS — G2581 Restless legs syndrome: Secondary | ICD-10-CM

## 2013-12-31 DIAGNOSIS — R400 Somnolence: Secondary | ICD-10-CM

## 2013-12-31 DIAGNOSIS — G4710 Hypersomnia, unspecified: Secondary | ICD-10-CM

## 2013-12-31 DIAGNOSIS — R51 Headache: Secondary | ICD-10-CM

## 2013-12-31 DIAGNOSIS — G4761 Periodic limb movement disorder: Secondary | ICD-10-CM

## 2013-12-31 DIAGNOSIS — Z823 Family history of stroke: Secondary | ICD-10-CM

## 2013-12-31 DIAGNOSIS — G43019 Migraine without aura, intractable, without status migrainosus: Secondary | ICD-10-CM

## 2013-12-31 DIAGNOSIS — Z8249 Family history of ischemic heart disease and other diseases of the circulatory system: Secondary | ICD-10-CM

## 2013-12-31 DIAGNOSIS — R519 Headache, unspecified: Secondary | ICD-10-CM

## 2013-12-31 MED ORDER — GADOPENTETATE DIMEGLUMINE 469.01 MG/ML IV SOLN: 20.0000 mL | Freq: Once | INTRAVENOUS | Status: AC | PRN

## 2014-01-04 NOTE — Progress Notes (Signed)
Quick Note:  Please call and advise the patient that the recent scan we did was within normal limits. We did a brain MRI with and wo contrast, which showed was normal. In particular, there were no acute findings, such as a stroke, or mass or blood products. No further action is required on this test at this time. Please remind patient to keep any upcoming appointments or tests and to call us with any interim questions, concerns, problems or updates. Thanks,  Star Age, MD, PhD   ______

## 2014-01-06 ENCOUNTER — Telehealth: Payer: Self-pay | Admitting: Neurology

## 2014-01-06 NOTE — Telephone Encounter (Signed)
Called pt per Dr. Rexene Alberts to inform her that pt's recent scan we did was within normal limits. We did a brain MRI with and without contrast, which showed was normal. In particular, there were no acute findings, such as a stroke, or mass or blood products. No further action is required on this test at this time. I reminded the pt to keep any upcoming appointments or tests and to call us with any interim questions, concerns, problems or updates. Pt verbalized understanding.

## 2014-01-06 NOTE — Telephone Encounter (Signed)
Patient requesting MRI results.  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

## 2014-01-07 ENCOUNTER — Ambulatory Visit: Payer: Self-pay | Admitting: Internal Medicine

## 2014-01-16 ENCOUNTER — Encounter: Payer: 59 | Attending: Family Medicine

## 2014-01-25 ENCOUNTER — Ambulatory Visit (HOSPITAL_COMMUNITY): Payer: 59

## 2014-01-26 ENCOUNTER — Ambulatory Visit (AMBULATORY_SURGERY_CENTER): Payer: 59 | Admitting: Internal Medicine

## 2014-01-26 ENCOUNTER — Encounter: Payer: Self-pay | Admitting: Internal Medicine

## 2014-01-26 VITALS — BP 120/81 | HR 85 | Temp 97.0°F | Resp 39 | Ht 63.0 in | Wt 271.0 lb

## 2014-01-26 DIAGNOSIS — R197 Diarrhea, unspecified: Secondary | ICD-10-CM

## 2014-01-26 LAB — GLUCOSE, CAPILLARY
Glucose-Capillary: 332 mg/dL — ABNORMAL HIGH (ref 70–99)
Glucose-Capillary: 274 mg/dL — ABNORMAL HIGH (ref 70–99)

## 2014-01-26 MED ORDER — SODIUM CHLORIDE 0.9 % IV SOLN: 500.0000 mL | INTRAVENOUS | Status: DC

## 2014-01-26 NOTE — Progress Notes (Signed)
Report to PACU, RN, vss, BBS= Clear.  

## 2014-01-26 NOTE — Progress Notes (Signed)
Called to room to assist during endoscopic procedure.  Patient ID and intended procedure confirmed with present staff. Received instructions for my participation in the procedure from the performing physician.  

## 2014-01-26 NOTE — Progress Notes (Signed)
Patient's blood glucose was 332 on admission. Patient is asymptomatic. Patient attributing the reading to drinking gateraide today. Patient's last insulin dosage was 01/24/14, lantus 44 units sq. Patient checked her blood sugar 01/25/14, 282 .  patient stating her A1C at last check was 14. Informed Dr.Gessner of above, no orders received.

## 2014-01-26 NOTE — Op Note (Signed)
[place]  [employer] [place], [address]   COLONOSCOPY PROCEDURE REPORT  PATIENT: Sheila Austin, Sheila Austin  MR#: [id number] BIRTHDATE: 1986-02-16 , 28  yrs. old GENDER: female ENDOSCOPIST: Gatha Mayer, MD, [facility] PROCEDURE DATE:  01/26/2014 PROCEDURE:   Colonoscopy with biopsy First Screening Colonoscopy - Avg.  risk and is 50 yrs.  old or older - No.  Prior Negative Screening - Now for repeat screening. N/A  History of Adenoma - Now for follow-up colonoscopy & has been > or = to 3 yrs.  N/A ASA CLASS:   Class III INDICATIONS:unexplained diarrhea and chronic diarrhea. MEDICATIONS: Monitored anesthesia care and 220  DESCRIPTION OF PROCEDURE:   After the risks benefits and alternatives of the procedure were thoroughly explained, informed consent was obtained.  revealed no abnormalities of the rectum. The [id number]  endoscope was introduced through the anus and advanced to the terminal ileum which was intubated for a short distance. No adverse events experienced.   The quality of the prep was excellent, using MiraLax  The instrument was then slowly withdrawn as the colon was fully examined.      COLON FINDINGS: The examined terminal ileum appeared to be normal. The colonic mucosa appeared normal throughout the entire examined colon.  Multiple random biopsies were performed using cold forceps. Sample was obtained and sent to histology.  Retroflexed views revealed no abnormalities. The time to cecum=1 minutes 08 seconds. Withdrawal time=5 minutes 14 seconds.  The scope was withdrawn and the procedure completed. COMPLICATIONS: There were no complications.  ENDOSCOPIC IMPRESSION: 1.   The examined terminal ileum appeared to be normal 2.   The colonic mucosa appeared normal throughout the entire examined colon; multiple random biopsies were performed using cold forceps RECOMMENDATIONS: Office will call with the results.  eSigned:  Gatha Mayer,  MD, [facility] 01/26/2014 4:37 PM   cc: The Patient

## 2014-01-26 NOTE — Patient Instructions (Addendum)
No signs of Crohn's or ulcerative colitis - it all looked normal. I took biopsies to check for microscopic colitis and we will let you know. Keep working on getting the blood sugar under control.  I appreciate the opportunity to care for you. Gatha Mayer, MD, FACG     YOU HAD AN ENDOSCOPIC PROCEDURE TODAY AT [facility]: Refer to the procedure report that was given to you for any specific questions about what was found during the examination.  If the procedure report does not answer your questions, please call your gastroenterologist to clarify.  If you requested that your care partner not be given the details of your procedure findings, then the procedure report has been included in a sealed envelope for you to review at your convenience later.  YOU SHOULD EXPECT: Some feelings of bloating in the abdomen. Passage of more gas than usual.  Walking can help get rid of the air that was put into your GI tract during the procedure and reduce the bloating. If you had a lower endoscopy (such as a colonoscopy or flexible sigmoidoscopy) you may notice spotting of blood in your stool or on the toilet paper. If you underwent a bowel prep for your procedure, then you may not have a normal bowel movement for a few days.  DIET: Your first meal following the procedure should be a light meal and then it is ok to progress to your normal diet.  A half-sandwich or bowl of soup is an example of a good first meal.  Heavy or fried foods are harder to digest and may make you feel nauseous or bloated.  Likewise meals heavy in dairy and vegetables can cause extra gas to form and this can also increase the bloating.  Drink plenty of fluids but you should avoid alcoholic beverages for 24 hours.  ACTIVITY: Your care partner should take you home directly after the procedure.  You should plan to take it easy, moving slowly for the rest of the day.  You can resume normal activity the day after the procedure  however you should NOT DRIVE or use heavy machinery for 24 hours (because of the sedation medicines used during the test).    SYMPTOMS TO REPORT IMMEDIATELY: A gastroenterologist can be reached at any hour.  During normal business hours, 8:30 AM to 5:00 PM Monday through Friday, call [phone].  After hours and on weekends, please call the GI answering service at [phone] who will take a message and have the physician on call contact you.   Following lower endoscopy (colonoscopy or flexible sigmoidoscopy):  Excessive amounts of blood in the stool  Significant tenderness or worsening of abdominal pains  Swelling of the abdomen that is new, acute  Fever of 100F or higher FOLLOW UP: If any biopsies were taken you will be contacted by phone or by letter within the next 1-3 weeks.  Call your gastroenterologist if you have not heard about the biopsies in 3 weeks.  Our staff will call the home number listed on your records the next business day following your procedure to check on you and address any questions or concerns that you may have at that time regarding the information given to you following your procedure. This is a courtesy call and so if there is no answer at the home number and we have not heard from you through the emergency physician on call, we will assume that you have returned to your regular daily activities  without incident.  SIGNATURES/CONFIDENTIALITY: You and/or your care partner have signed paperwork which will be entered into your electronic medical record.  These signatures attest to the fact that that the information above on your After Visit Summary has been reviewed and is understood.  Full responsibility of the confidentiality of this discharge information lies with you and/or your care-partner.    Your blood sugar was 274 in the recovery room.  Continue your sugar meds as already prescribed per Dr. Carlean Purl. You may resume your current medications today. Await  biopsy results. Please call if any questions or concerns.

## 2014-01-26 NOTE — Progress Notes (Signed)
No problems noted in the recovery room. maw 

## 2014-01-27 ENCOUNTER — Telehealth: Payer: Self-pay | Admitting: *Deleted

## 2014-01-27 NOTE — Telephone Encounter (Signed)
  Follow up Call-  Call back number 01/26/2014  Post procedure Call Back phone  # [phone]  Permission to leave phone message Yes    [facility]

## 2014-01-31 NOTE — Progress Notes (Signed)
Patient was seen on 01/16/14 for the complete diabetes self-management series at the [facility]. This is a part of the Link to [employer].  Handouts given during class include:  Living Well with Diabetes book  Carb Counting and Meal Planning book  Meal Plan Card  Carbohydrate guide  Meal planning worksheet  Low Sodium Flavoring Tips  The diabetes portion plate  Low Carbohydrate Snack Suggestions  A1c to eAG Conversion Chart  Diabetes Medications  Stress Management  Diabetes Recommended Care Schedule  Diabetes Success Plan  Core Class Satisfaction Survey  The following learning objectives were met by the patient during this course:  Describe diabetes  State some common risk factors for diabetes  Defines the role of glucose and insulin  Identifies type of diabetes and pathophysiology  Describe the relationship between diabetes and cardiovascular risk  State the members of the Healthcare Team  States the rationale for glucose monitoring  State when to test glucose  State their individual Target Range  State the importance of logging glucose readings  Describe how to interpret glucose readings  Identifies A1C target  Explain the correlation between A1c and eAG values  State symptoms and treatment of high blood glucose  State symptoms and treatment of low blood glucose  Explain proper technique for glucose testing  Identifies proper sharps disposal  Describe the role of different macronutrients on glucose  Explain how carbohydrates affect blood glucose  State what foods contain the most carbohydrates  Demonstrate carbohydrate counting  Demonstrate how to read Nutrition Facts food label  Describe effects of various fats on heart health  Describe the importance of good nutrition for health and healthy eating strategies  Describe techniques for managing your shopping, cooking and meal planning  List  strategies to follow meal plan when dining out  Describe the effects of alcohol on glucose and how to use it safely   State the amount of activity recommended for healthy living   Describe activities suitable for individual needs   Identify ways to regularly incorporate activity into daily life   Identify barriers to activity and ways to over come these barriers  Identify diabetes medications being personally used and their primary action for lowering glucose and possible side effects   Describe role of stress on blood glucose and develop strategies to address psychosocial issues   Identify diabetes complications and ways to prevent them  Explain how to manage diabetes during illness   Evaluate success in meeting personal goal   Establish 2-3 goals that they will plan to diligently work on until they return for the  36-monthfollow-up visit  [facility]

## 2014-01-31 NOTE — Patient Instructions (Signed)
   I will count my carb choices at most meals and snacks  I will be active 30 minutes or more 4 times a week  I will take my diabetes medications as scheduled  I will test my glucose at least 1-2 times a day, 7 days a week  I will begin a log book of blood sugars.    Potential Barriers: finances, stress, lack of family support  Diabetes Support plan:Link to wellness program, On-Line resources

## 2014-02-02 NOTE — Progress Notes (Signed)
Quick Note:  Let her know that colon biopsies are ok Ask ker how she is doing re: diarrhea  No letter or recall from [place] ______

## 2014-02-03 NOTE — Progress Notes (Signed)
Quick Note:  Continue Imodium Get BS controlled See me 3 months ______

## 2014-02-12 ENCOUNTER — Encounter: Payer: 59 | Admitting: Family Medicine

## 2014-02-23 ENCOUNTER — Ambulatory Visit (INDEPENDENT_AMBULATORY_CARE_PROVIDER_SITE_OTHER): Payer: 59 | Admitting: Emergency Medicine

## 2014-02-23 ENCOUNTER — Encounter: Payer: Self-pay | Admitting: Family Medicine

## 2014-02-23 VITALS — BP 122/90 | HR 95 | Temp 98.0°F | Resp 16 | Ht 63.25 in | Wt 272.0 lb

## 2014-02-23 DIAGNOSIS — R21 Rash and other nonspecific skin eruption: Secondary | ICD-10-CM

## 2014-02-23 DIAGNOSIS — N760 Acute vaginitis: Secondary | ICD-10-CM

## 2014-02-23 DIAGNOSIS — B373 Candidiasis of vulva and vagina: Secondary | ICD-10-CM

## 2014-02-23 DIAGNOSIS — N898 Other specified noninflammatory disorders of vagina: Secondary | ICD-10-CM

## 2014-02-23 DIAGNOSIS — IMO0002 Reserved for concepts with insufficient information to code with codable children: Secondary | ICD-10-CM

## 2014-02-23 DIAGNOSIS — B9689 Other specified bacterial agents as the cause of diseases classified elsewhere: Secondary | ICD-10-CM

## 2014-02-23 DIAGNOSIS — E1165 Type 2 diabetes mellitus with hyperglycemia: Secondary | ICD-10-CM

## 2014-02-23 DIAGNOSIS — Z0000 Encounter for general adult medical examination without abnormal findings: Secondary | ICD-10-CM

## 2014-02-23 DIAGNOSIS — A499 Bacterial infection, unspecified: Secondary | ICD-10-CM

## 2014-02-23 DIAGNOSIS — E669 Obesity, unspecified: Secondary | ICD-10-CM

## 2014-02-23 DIAGNOSIS — Z124 Encounter for screening for malignant neoplasm of cervix: Secondary | ICD-10-CM

## 2014-02-23 DIAGNOSIS — B3731 Acute candidiasis of vulva and vagina: Secondary | ICD-10-CM

## 2014-02-23 LAB — POCT WET PREP WITH KOH
KOH PREP POC: POSITIVE
TRICHOMONAS UA: NEGATIVE
Yeast Wet Prep HPF POC: NEGATIVE

## 2014-02-23 LAB — GLUCOSE, POCT (MANUAL RESULT ENTRY): POC GLUCOSE: 384 mg/dL — AB (ref 70–99)

## 2014-02-23 LAB — POCT SKIN KOH: Skin KOH, POC: NEGATIVE

## 2014-02-23 MED ORDER — METRONIDAZOLE 500 MG PO TABS: 500.0000 mg | ORAL_TABLET | Freq: Two times a day (BID) | ORAL | Status: DC

## 2014-02-23 MED ORDER — METFORMIN HCL ER (MOD) 500 MG PO TB24: ORAL_TABLET | ORAL | Status: DC

## 2014-02-23 MED ORDER — CLOBETASOL PROPIONATE 0.05 % EX CREA: 1.0000 "application " | TOPICAL_CREAM | Freq: Two times a day (BID) | CUTANEOUS | Status: DC

## 2014-02-23 MED ORDER — FLUCONAZOLE 150 MG PO TABS: 150.0000 mg | ORAL_TABLET | Freq: Once | ORAL | Status: DC

## 2014-02-23 NOTE — Progress Notes (Signed)
Subjective:    Patient ID: Sheila Austin, female    DOB: 1985/11/01, 28 y.o.   MRN: [id number]  HPI Patient presents today for CPE and follow up of DM. The patient is accompanied by her husband.  DM- The patient is taking Lantus 40 units daily. Has been meeting with diabetes educator. This morning her blood sugar was 398. Had a glass of cranberry-grape juice in the middle of the night. She does not have her home blood sugar log with her, but reports that her blood sugars are consistently running high, in the 300s. She was reluctant to increase the latus over 40 units and her DNE suggested she add [place]. The patient had been on metformin 2000 mg a number of years ago, with some success in lowering her blood sugar, but has problems with chronic diarrhea that was exacerbated by metformin. She sees GI for her diarrhea and recently had a negative colonoscopy. She is currently on lomotil with good control of her diarrhea. She was referred to endocrinology several months ago, but did not keep her appointment. She is willing to be referred again. The patient expresses how difficult it is to watch what she eats in the confines of the guidelines given to her by nutrition. She states that it takes her an hour to go to the grocery store to read all the labels.   Headaches- She saw Dr. Rexene Alberts (neuro), for her headaches. She was put on fioricet with little relief. She has a headache about every 2-3 weeks. It is relieved with rest/dark room with in a couple of hours. She does not sleep well and has a questionable history of OSA. Dr. Rexene Alberts has recommended a sleep study.   Vaginal/labial itching- Patient reports that she has redness and irritation of her labia. She denies discharge. She is not sure when this started.   Right lower leg rash- patient with raised area on left lower anterior leg that has been there for years. She thinks it is gradually getting larger. She has never used any prescription medicine on  it. She has tried various lotions without improvement. She also has an area above this lesion which has scattered, raised, scabbed areas. These have been present for awhile. Have not spread. Itches some.   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 She reports that her greatest stressor is from her mother who lives with her and her husband. He mother has several chronic illnesses and is very demanding of the patient's time and attention. The patient states that her mother will not be able to live on her own, ever, for a variety of reasons. According to the patient, her mother is a barrier to her being able to work on her diet. Her mother does the majority of the shopping and cooking.   Review of Systems  Constitutional: Negative.   Eyes: Positive for photophobia.  Respiratory: Negative.   Cardiovascular: Negative.   Gastrointestinal: Positive for nausea and diarrhea.  Endocrine: Positive for polydipsia.  Genitourinary: Negative.   Musculoskeletal: Negative.   Skin: Negative.   Allergic/Immunologic: Negative.   Neurological: Positive for light-headedness and headaches.  Hematological: Negative.   Psychiatric/Behavioral: Negative.        Objective:   Physical Exam  Vitals reviewed. Constitutional: She is oriented to person, place, and time. She appears well-developed and well-nourished.  HENT:  Head: Normocephalic and atraumatic.  Right Ear: Tympanic membrane, external ear  and ear canal normal.  Left Ear: Tympanic membrane, external ear and ear canal normal.  Nose: Nose normal.  Mouth/Throat: Uvula is midline, oropharynx is clear and moist and mucous membranes are normal. Abnormal dentition: missing teeth.  Eyes: Conjunctivae and EOM are normal. Pupils are equal, round, and reactive to light. Right eye exhibits no discharge. Left eye exhibits no discharge. No scleral icterus.  Neck: Normal range of motion. Neck supple. No  thyromegaly present.  Cardiovascular: Normal rate, regular rhythm and normal heart sounds.   Pulmonary/Chest: Effort normal and breath sounds normal.  Abdominal: Soft. Bowel sounds are normal.  Genitourinary: Pelvic exam was performed with patient supine. No labial fusion. There is rash and tenderness on the right labia. There is no lesion or injury on the right labia. There is rash and tenderness on the left labia. There is no lesion or injury on the left labia. Vaginal discharge found.  Labia inflamed, cervix with thick white discharge.    Musculoskeletal: Normal range of motion. She exhibits no edema and no tenderness.  Lymphadenopathy:    She has no cervical adenopathy.  Neurological: She is alert and oriented to person, place, and time.  Skin: Skin is warm and dry. Rash noted.     Psychiatric: She has a normal mood and affect. Her behavior is normal. Judgment and thought content normal.   Results for orders placed in visit on 02/23/14  POCT WET PREP WITH KOH      Result Value Ref Range   Trichomonas, UA Negative     Clue Cells Wet Prep HPF POC tntc     Epithelial Wet Prep HPF POC tntc     Yeast Wet Prep HPF POC neg     Bacteria Wet Prep HPF POC 3+     RBC Wet Prep HPF POC 3-4     WBC Wet Prep HPF POC tntc     KOH Prep POC Positive    POCT SKIN KOH      Result Value Ref Range   Skin KOH, POC Negative    GLUCOSE, POCT (MANUAL RESULT ENTRY)      Result Value Ref Range   POC Glucose 384 (*) 70 - 99 mg/dl      Assessment & Plan:  1. Annual physical exam  2. Screening for cervical cancer - Pap IG, CT/NG w/ reflex HPV when ASC-U  3. Vaginal discharge - Pap IG, CT/NG w/ reflex HPV when ASC-U - POCT Wet Prep with KOH  4. Rash and nonspecific skin eruption - POCT Skin KOH- negative - clobetasol cream (TEMOVATE) 0.05 %; Apply 1 application topically 2 (two) times daily.  Dispense: 30 g; Refill: 0  5. Obesity - POCT glucose (manual entry)  6. Diabetes type 2,  uncontrolled -Discussed medication adjustment with Dr. Everlene Farrier -reluctant to add Januvia without metformin. Discussed gradually restarting metformin with patient to see if she can tolerate from a GI standpoint. - metFORMIN (GLUMETZA) 500 MG (MOD) 24 hr tablet; Take 1/2 tablet by mouth once a day for 1 week, then increase to 1 tablet once a day  Dispense: 30 tablet; Refill: 2 -Discussed lack of weight loss with patient and need for improved blood sugar control for long term health benefits -Encouraged strategies to decrease stress and improve relationship with her mother and need to preserve marital relationship. Suggested that counseling may be helpful and recommended a book.  -Patient had an appointment with endocrine, but was unable to keep due to finances. She reports that she  is now better able to go to endocrine and is willing to keep an appointment.  -She will continue to work with nutrition -It was too early to check HgA1C today, will put in a future order and patient will come fasting in two weeks and will also check fasting lipid panel.  7. Bacterial vaginosis - metroNIDAZOLE (FLAGYL) 500 MG tablet; Take 1 tablet (500 mg total) by mouth 2 (two) times daily with a meal. DO NOT CONSUME ALCOHOL WHILE TAKING THIS MEDICATION.  Dispense: 14 tablet; Refill: 0  8. Vaginal yeast infection - fluconazole (DIFLUCAN) 150 MG tablet; Take 1 tablet (150 mg total) by mouth once. Repeat if needed  Dispense: 2 tablet; Refill: 0 -Can use OTC monistat cream on labia if needed.  -Follow up in 4 months.   Elby Beck, FNP-BC  [facility]  02/23/2014 1:19 PM

## 2014-02-23 NOTE — Patient Instructions (Signed)
Keep working on [employer] and walk most days- at least every other day  Reccommended book- [employer] by [employer], Ferman Hamming  Bacterial Vaginosis Bacterial vaginosis is a vaginal infection that occurs when the normal balance of bacteria in the vagina is disrupted. It results from an overgrowth of certain bacteria. This is the most common vaginal infection in women of childbearing age. Treatment is important to prevent complications, especially in pregnant women, as it can cause a premature delivery. CAUSES  Bacterial vaginosis is caused by an increase in harmful bacteria that are normally present in smaller amounts in the vagina. Several different kinds of bacteria can cause bacterial vaginosis. However, the reason that the condition develops is not fully understood. RISK FACTORS Certain activities or behaviors can put you at an increased risk of developing bacterial vaginosis, including:  Having a new sex partner or multiple sex partners.  Douching.  Using an intrauterine device (IUD) for contraception. Women do not get bacterial vaginosis from toilet seats, bedding, swimming pools, or contact with objects around them. SIGNS AND SYMPTOMS  Some women with bacterial vaginosis have no signs or symptoms. Common symptoms include:  Grey vaginal discharge.  A fishlike odor with discharge, especially after sexual intercourse.  Itching or burning of the vagina and vulva.  Burning or pain with urination. DIAGNOSIS  Your health care provider will take a medical history and examine the vagina for signs of bacterial vaginosis. A sample of vaginal fluid may be taken. Your health care provider will look at this sample under a microscope to check for bacteria and abnormal cells. A vaginal pH test may also be done.  TREATMENT  Bacterial vaginosis may be treated with antibiotic medicines. These may be given in the form of a pill or a vaginal cream. A second round of antibiotics may be prescribed  if the condition comes back after treatment.  HOME CARE INSTRUCTIONS   Only take over-the-counter or prescription medicines as directed by your health care provider.  If antibiotic medicine was prescribed, take it as directed. Make sure you finish it even if you start to feel better.  Do not have sex until treatment is completed.  Tell all sexual partners that you have a vaginal infection. They should see their health care provider and be treated if they have problems, such as a mild rash or itching.  Practice safe sex by using condoms and only having one sex partner. SEEK MEDICAL CARE IF:   Your symptoms are not improving after 3 days of treatment.  You have increased discharge or pain.  You have a fever. MAKE SURE YOU:   Understand these instructions.  Will watch your condition.  Will get help right away if you are not doing well or get worse. FOR MORE INFORMATION  Centers for Disease Control and Prevention, Division of STD Prevention: [employer] American Sexual Health Association (ASHA): www.ashastd.org  Document Released: 04/23/2005 Document Revised: 02/11/2013 Document Reviewed: 12/03/2012 [facility] Patient Information 2015 [place], [place]. This information is not intended to replace advice given to you by your health care provider. Make sure you discuss any questions you have with your health care provider. Candida Infection A Candida infection (also called yeast, fungus, and Monilia infection) is an overgrowth of yeast that can occur anywhere on the body. A yeast infection commonly occurs in warm, moist body areas. Usually, the infection remains localized but can spread to become a systemic infection. A yeast infection may be a sign of a more severe disease such as  diabetes, leukemia, or AIDS. A yeast infection can occur in both men and women. In women, Candida vaginitis is a vaginal infection. It is one of the most common causes of vaginitis. Men usually do not have  symptoms or know they have an infection until other problems develop. Men may find out they have a yeast infection because their sex partner has a yeast infection. Uncircumcised men are more likely to get a yeast infection than circumcised men. This is because the uncircumcised glans is not exposed to air and does not remain as dry as that of a circumcised glans. Older adults may develop yeast infections around dentures. CAUSES  Women  Antibiotics.  Steroid medication taken for a long time.  Being overweight (obese).  Diabetes.  Poor immune condition.  Certain serious medical conditions.  Immune suppressive medications for organ transplant patients.  Chemotherapy.  Pregnancy.  Menstruation.  Stress and fatigue.  Intravenous drug use.  Oral contraceptives.  Wearing tight-fitting clothes in the crotch area.  Catching it from a sex partner who has a yeast infection.  Spermicide.  Intravenous, urinary, or other catheters. Men  Catching it from a sex partner who has a yeast infection.  Having oral or anal sex with a person who has the infection.  Spermicide.  Diabetes.  Antibiotics.  Poor immune system.  Medications that suppress the immune system.  Intravenous drug use.  Intravenous, urinary, or other catheters. SYMPTOMS  Women  Thick, white vaginal discharge.  Vaginal itching.  Redness and swelling in and around the vagina.  Irritation of the lips of the vagina and perineum.  Blisters on the vaginal lips and perineum.  Painful sexual intercourse.  Low blood sugar (hypoglycemia).  Painful urination.  Bladder infections.  Intestinal problems such as constipation, indigestion, bad breath, bloating, increase in gas, diarrhea, or loose stools. Men  Men may develop intestinal problems such as constipation, indigestion, bad breath, bloating, increase in gas, diarrhea, or loose stools.  Dry, cracked skin on the penis with itching or  discomfort.  Jock itch.  Dry, flaky skin.  Athlete's foot.  Hypoglycemia. DIAGNOSIS  Women  A history and an exam are performed.  The discharge may be examined under a microscope.  A culture may be taken of the discharge. Men  A history and an exam are performed.  Any discharge from the penis or areas of cracked skin will be looked at under the microscope and cultured.  Stool samples may be cultured. TREATMENT  Women  Vaginal antifungal suppositories and creams.  Medicated creams to decrease irritation and itching on the outside of the vagina.  Warm compresses to the perineal area to decrease swelling and discomfort.  Oral antifungal medications.  Medicated vaginal suppositories or cream for repeated or recurrent infections.  Wash and dry the irritation areas before applying the cream.  Eating yogurt with Lactobacillus may help with prevention and treatment.  Sometimes painting the vagina with gentian violet solution may help if creams and suppositories do not work. Men  Antifungal creams and oral antifungal medications.  Sometimes treatment must continue for 30 days after the symptoms go away to prevent recurrence. HOME CARE INSTRUCTIONS  Women  Use cotton underwear and avoid tight-fitting clothing.  Avoid colored, scented toilet paper and deodorant tampons or pads.  Do not douche.  Keep your diabetes under control.  Finish all the prescribed medications.  Keep your skin clean and dry.  Consume milk or yogurt with Lactobacillus-active culture regularly. If you get frequent yeast infections and  think that is what the infection is, there are over-the-counter medications that you can get. If the infection does not show healing in 3 days, talk to your caregiver.  Tell your sex partner you have a yeast infection. Your partner may need treatment also, especially if your infection does not clear up or recurs. Men  Keep your skin clean and dry.  Keep your  diabetes under control.  Finish all prescribed medications.  Tell your sex partner that you have a yeast infection so he or she can be treated if necessary. SEEK MEDICAL CARE IF:   Your symptoms do not clear up or worsen in one week after treatment.  You have an oral temperature above 102 F (38.9 C).  You have trouble swallowing or eating for a prolonged time.  You develop blisters on and around your vagina.  You develop vaginal bleeding and it is not your menstrual period.  You develop abdominal pain.  You develop intestinal problems as mentioned above.  You get weak or light-headed.  You have painful or increased urination.  You have pain during sexual intercourse. MAKE SURE YOU:   Understand these instructions.  Will watch your condition.  Will get help right away if you are not doing well or get worse. Document Released: 05/31/2004 Document Revised: 09/07/2013 Document Reviewed: 09/12/2009 [facility] Patient Information 2015 [place], [place]. This information is not intended to replace advice given to you by your health care provider. Make sure you discuss any questions you have with your health care provider.

## 2014-02-24 ENCOUNTER — Encounter: Payer: Self-pay | Admitting: Internal Medicine

## 2014-02-24 LAB — PAP IG, CT-NG, RFX HPV ASCU
CHLAMYDIA PROBE AMP: NEGATIVE
GC PROBE AMP: NEGATIVE

## 2014-02-26 NOTE — Telephone Encounter (Signed)
Error

## 2014-03-08 ENCOUNTER — Encounter: Payer: Self-pay | Admitting: Family Medicine

## 2014-03-16 ENCOUNTER — Telehealth: Payer: Self-pay | Admitting: Family Medicine

## 2014-03-16 NOTE — Telephone Encounter (Signed)
Error

## 2014-04-07 ENCOUNTER — Encounter (HOSPITAL_COMMUNITY): Payer: Self-pay | Admitting: *Deleted

## 2014-04-07 ENCOUNTER — Emergency Department (INDEPENDENT_AMBULATORY_CARE_PROVIDER_SITE_OTHER)
Admission: EM | Admit: 2014-04-07 | Discharge: 2014-04-07 | Disposition: A | Discharge status: Home / Self Care | Payer: 59 | Source: Home / Self Care | Attending: Emergency Medicine | Admitting: Emergency Medicine

## 2014-04-07 DIAGNOSIS — N390 Urinary tract infection, site not specified: Secondary | ICD-10-CM

## 2014-04-07 DIAGNOSIS — M545 Low back pain, unspecified: Secondary | ICD-10-CM

## 2014-04-07 DIAGNOSIS — R000 Tachycardia, unspecified: Secondary | ICD-10-CM

## 2014-04-07 DIAGNOSIS — IMO0002 Reserved for concepts with insufficient information to code with codable children: Secondary | ICD-10-CM

## 2014-04-07 DIAGNOSIS — E1165 Type 2 diabetes mellitus with hyperglycemia: Secondary | ICD-10-CM

## 2014-04-07 LAB — POCT URINALYSIS DIP (DEVICE)
Bilirubin Urine: NEGATIVE
GLUCOSE, UA: 500 mg/dL — AB
KETONES UR: NEGATIVE mg/dL
Leukocytes, UA: NEGATIVE
Nitrite: POSITIVE — AB
Protein, ur: NEGATIVE mg/dL
SPECIFIC GRAVITY, URINE: 1.01 (ref 1.005–1.030)
UROBILINOGEN UA: 0.2 mg/dL (ref 0.0–1.0)
pH: 5.5 (ref 5.0–8.0)

## 2014-04-07 LAB — CBC WITH DIFFERENTIAL/PLATELET
Basophils Absolute: 0 10*3/uL (ref 0.0–0.1)
Basophils Relative: 0 % (ref 0–1)
EOS ABS: 0.2 10*3/uL (ref 0.0–0.7)
EOS PCT: 2 % (ref 0–5)
HCT: 42.6 % (ref 36.0–46.0)
HEMOGLOBIN: 15.3 g/dL — AB (ref 12.0–15.0)
Lymphocytes Relative: 27 % (ref 12–46)
Lymphs Abs: 3 10*3/uL (ref 0.7–4.0)
MCH: 30.7 pg (ref 26.0–34.0)
MCHC: 35.9 g/dL (ref 30.0–36.0)
MCV: 85.5 fL (ref 78.0–100.0)
Monocytes Absolute: 0.8 10*3/uL (ref 0.1–1.0)
Monocytes Relative: 7 % (ref 3–12)
NEUTROS PCT: 64 % (ref 43–77)
Neutro Abs: 7.2 10*3/uL (ref 1.7–7.7)
Platelets: ADEQUATE 10*3/uL (ref 150–400)
RBC: 4.98 MIL/uL (ref 3.87–5.11)
RDW: 13 % (ref 11.5–15.5)
WBC: 11.2 10*3/uL — ABNORMAL HIGH (ref 4.0–10.5)

## 2014-04-07 LAB — POCT PREGNANCY, URINE: PREG TEST UR: NEGATIVE

## 2014-04-07 LAB — POCT I-STAT, CHEM 8
BUN: 8 mg/dL (ref 6–23)
CREATININE: 0.6 mg/dL (ref 0.50–1.10)
Calcium, Ion: 0.93 mmol/L — ABNORMAL LOW (ref 1.12–1.23)
Chloride: 98 mEq/L (ref 96–112)
Glucose, Bld: 417 mg/dL — ABNORMAL HIGH (ref 70–99)
HCT: 48 % — ABNORMAL HIGH (ref 36.0–46.0)
Hemoglobin: 16.3 g/dL — ABNORMAL HIGH (ref 12.0–15.0)
Potassium: 5.1 mEq/L (ref 3.7–5.3)
Sodium: 134 mEq/L — ABNORMAL LOW (ref 137–147)
TCO2: 24 mmol/L (ref 0–100)

## 2014-04-07 LAB — D-DIMER, QUANTITATIVE (NOT AT ARMC): D-Dimer, Quant: 0.48 ug/mL-FEU (ref 0.00–0.48)

## 2014-04-07 MED ORDER — TRAMADOL HCL 50 MG PO TABS: 50.0000 mg | ORAL_TABLET | Freq: Four times a day (QID) | ORAL | Status: DC | PRN

## 2014-04-07 MED ORDER — SULFAMETHOXAZOLE-TRIMETHOPRIM 800-160 MG PO TABS: 1.0000 | ORAL_TABLET | Freq: Two times a day (BID) | ORAL | Status: DC
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
## 2014-04-08 ENCOUNTER — Other Ambulatory Visit: Payer: Self-pay | Admitting: Family Medicine

## 2014-04-09 ENCOUNTER — Telehealth (HOSPITAL_COMMUNITY): Payer: Self-pay | Admitting: Emergency Medicine

## 2014-04-09 LAB — URINE CULTURE: Colony Count: >=100000

## 2014-04-09 MED ORDER — CIPROFLOXACIN HCL 250 MG PO TABS: 250.0000 mg | ORAL_TABLET | Freq: Two times a day (BID) | ORAL | Status: DC

## 2014-04-09 NOTE — ED Notes (Signed)
The patient was treated with Septra for UTI symptoms. She is allergic to penicillin. Her urine is growing out greater than 100,000 colonies of Escherichia coli which is resistant to Septra. It is sensitive to all other antibiotics except for ampicillin. Would like to treat with ciprofloxacin 250 mg #14, one twice a day for one week. We will call the patient and inform her of this result.  Harden Mo, MD 04/09/14 [phone]

## 2014-04-10 ENCOUNTER — Telehealth (HOSPITAL_COMMUNITY): Payer: Self-pay | Admitting: *Deleted

## 2014-04-11 NOTE — ED Notes (Signed)
I called but no answer. Call 1. 04/10/2014 No answer on mobile number. I called and left a message on other home number.Call 2.   I called contact- mother and told her that she needs to stop Septra and take all of Cipro and where to pick up the Rx.  Mom said she is allergic to Cipro. I told her, if her daughter develops an allergy to Cipro to call us back. She said she would give her the message when she wakes up. 04/11/2014

## 2014-06-29 ENCOUNTER — Ambulatory Visit: Payer: 59 | Admitting: Family Medicine

## 2014-11-25 ENCOUNTER — Emergency Department (HOSPITAL_COMMUNITY): Payer: 59

## 2014-11-25 ENCOUNTER — Encounter (HOSPITAL_COMMUNITY): Payer: Self-pay | Admitting: Emergency Medicine

## 2014-11-25 ENCOUNTER — Emergency Department (HOSPITAL_COMMUNITY)
Admission: EM | Admit: 2014-11-25 | Discharge: 2014-11-25 | Disposition: A | Discharge status: Home / Self Care | Payer: 59 | Attending: Emergency Medicine | Admitting: Emergency Medicine

## 2014-11-25 DIAGNOSIS — N390 Urinary tract infection, site not specified: Secondary | ICD-10-CM | POA: Insufficient documentation

## 2014-11-25 DIAGNOSIS — I10 Essential (primary) hypertension: Secondary | ICD-10-CM | POA: Insufficient documentation

## 2014-11-25 DIAGNOSIS — Z880 Allergy status to penicillin: Secondary | ICD-10-CM | POA: Diagnosis not present

## 2014-11-25 DIAGNOSIS — Z8719 Personal history of other diseases of the digestive system: Secondary | ICD-10-CM | POA: Insufficient documentation

## 2014-11-25 DIAGNOSIS — R112 Nausea with vomiting, unspecified: Secondary | ICD-10-CM | POA: Insufficient documentation

## 2014-11-25 DIAGNOSIS — Z79899 Other long term (current) drug therapy: Secondary | ICD-10-CM | POA: Insufficient documentation

## 2014-11-25 DIAGNOSIS — R197 Diarrhea, unspecified: Secondary | ICD-10-CM | POA: Insufficient documentation

## 2014-11-25 DIAGNOSIS — Z8669 Personal history of other diseases of the nervous system and sense organs: Secondary | ICD-10-CM | POA: Diagnosis not present

## 2014-11-25 DIAGNOSIS — E1165 Type 2 diabetes mellitus with hyperglycemia: Secondary | ICD-10-CM | POA: Diagnosis not present

## 2014-11-25 DIAGNOSIS — Z8619 Personal history of other infectious and parasitic diseases: Secondary | ICD-10-CM | POA: Diagnosis not present

## 2014-11-25 DIAGNOSIS — R739 Hyperglycemia, unspecified: Secondary | ICD-10-CM

## 2014-11-25 DIAGNOSIS — Z8709 Personal history of other diseases of the respiratory system: Secondary | ICD-10-CM | POA: Insufficient documentation

## 2014-11-25 DIAGNOSIS — R1084 Generalized abdominal pain: Secondary | ICD-10-CM | POA: Diagnosis present

## 2014-11-25 LAB — CBC WITH DIFFERENTIAL/PLATELET
Basophils Absolute: 0 10*3/uL (ref 0.0–0.1)
Basophils Relative: 0 % (ref 0–1)
EOS PCT: 1 % (ref 0–5)
Eosinophils Absolute: 0.1 10*3/uL (ref 0.0–0.7)
HCT: 41.2 % (ref 36.0–46.0)
Hemoglobin: 14.8 g/dL (ref 12.0–15.0)
LYMPHS ABS: 2.5 10*3/uL (ref 0.7–4.0)
LYMPHS PCT: 24 % (ref 12–46)
MCH: 30.3 pg (ref 26.0–34.0)
MCHC: 35.9 g/dL (ref 30.0–36.0)
MCV: 84.4 fL (ref 78.0–100.0)
MONOS PCT: 8 % (ref 3–12)
Monocytes Absolute: 0.8 10*3/uL (ref 0.1–1.0)
Neutro Abs: 7.2 10*3/uL (ref 1.7–7.7)
Neutrophils Relative %: 67 % (ref 43–77)
Platelets: 263 10*3/uL (ref 150–400)
RBC: 4.88 MIL/uL (ref 3.87–5.11)
RDW: 12.9 % (ref 11.5–15.5)
WBC: 10.7 10*3/uL — ABNORMAL HIGH (ref 4.0–10.5)

## 2014-11-25 LAB — COMPREHENSIVE METABOLIC PANEL
ALBUMIN: 3.2 g/dL — AB (ref 3.5–5.0)
ALK PHOS: 61 U/L (ref 38–126)
ALT: 13 U/L — AB (ref 14–54)
ANION GAP: 12 (ref 5–15)
AST: 16 U/L (ref 15–41)
BUN: 8 mg/dL (ref 6–20)
CALCIUM: 8.8 mg/dL — AB (ref 8.9–10.3)
CO2: 24 mmol/L (ref 22–32)
CREATININE: 0.7 mg/dL (ref 0.44–1.00)
Chloride: 98 mmol/L — ABNORMAL LOW (ref 101–111)
GFR calc Af Amer: >60 mL/min (ref >60)
GFR calc non Af Amer: >60 mL/min (ref >60)
Glucose, Bld: 426 mg/dL — ABNORMAL HIGH (ref 65–99)
Potassium: 3.8 mmol/L (ref 3.5–5.1)
Sodium: 134 mmol/L — ABNORMAL LOW (ref 135–145)
TOTAL PROTEIN: 7.3 g/dL (ref 6.5–8.1)
Total Bilirubin: 0.6 mg/dL (ref 0.3–1.2)

## 2014-11-25 LAB — I-STAT CG4 LACTIC ACID, ED
Lactic Acid, Venous: 2.54 mmol/L (ref 0.5–2.0)
Lactic Acid, Venous: 2.63 mmol/L (ref 0.5–2.0)

## 2014-11-25 LAB — POC URINE PREG, ED: PREG TEST UR: NEGATIVE

## 2014-11-25 LAB — URINALYSIS, ROUTINE W REFLEX MICROSCOPIC
BILIRUBIN URINE: NEGATIVE
Glucose, UA: >1000 mg/dL — AB
KETONES UR: 15 mg/dL — AB
Nitrite: NEGATIVE
Protein, ur: 30 mg/dL — AB
SPECIFIC GRAVITY, URINE: 1.037 — AB (ref 1.005–1.030)
UROBILINOGEN UA: 0.2 mg/dL (ref 0.0–1.0)
pH: 5 (ref 5.0–8.0)

## 2014-11-25 LAB — CBG MONITORING, ED: Glucose-Capillary: 305 mg/dL — ABNORMAL HIGH (ref 65–99)

## 2014-11-25 LAB — URINE MICROSCOPIC-ADD ON

## 2014-11-25 LAB — LIPASE, BLOOD: Lipase: 38 U/L (ref 22–51)

## 2014-11-25 MED ORDER — SODIUM CHLORIDE 0.9 % IV BOLUS (SEPSIS)
1000.0000 mL | Freq: Once | INTRAVENOUS | Status: AC
  Administered 2014-11-25: 1000 mL via INTRAVENOUS

## 2014-11-25 MED ORDER — IOHEXOL 300 MG/ML  SOLN
25.0000 mL | Freq: Once | INTRAMUSCULAR | Status: AC | PRN
Start: 2014-11-25 — End: 2014-11-25
  Administered 2014-11-25: 25 mL via ORAL

## 2014-11-25 MED ORDER — HYDROCODONE-ACETAMINOPHEN 5-325 MG PO TABS: 1.0000 | ORAL_TABLET | ORAL | Status: DC | PRN

## 2014-11-25 MED ORDER — HYDROCODONE-ACETAMINOPHEN 5-325 MG PO TABS: 2.0000 | ORAL_TABLET | ORAL | Status: DC | PRN

## 2014-11-25 MED ORDER — ONDANSETRON HCL 4 MG/2ML IJ SOLN
4.0000 mg | Freq: Once | INTRAMUSCULAR | Status: AC
  Administered 2014-11-25: 4 mg via INTRAVENOUS
  Filled 2014-11-25: qty 2

## 2014-11-25 MED ORDER — CIPROFLOXACIN HCL 500 MG PO TABS: 500.0000 mg | ORAL_TABLET | Freq: Two times a day (BID) | ORAL | Status: DC

## 2014-11-25 MED ORDER — ONDANSETRON 4 MG PO TBDP: 4.0000 mg | ORAL_TABLET | Freq: Three times a day (TID) | ORAL | Status: DC | PRN

## 2014-11-25 MED ORDER — FENTANYL CITRATE (PF) 100 MCG/2ML IJ SOLN
50.0000 ug | Freq: Once | INTRAMUSCULAR | Status: AC
  Administered 2014-11-25: 50 ug via INTRAVENOUS
  Filled 2014-11-25: qty 2

## 2014-11-25 MED ORDER — SODIUM CHLORIDE 0.9 % IV BOLUS (SEPSIS)
1000.0000 mL | Freq: Once | INTRAVENOUS | Status: AC
Start: 2014-11-25 — End: 2014-11-25
  Administered 2014-11-25: 1000 mL via INTRAVENOUS

## 2014-11-25 MED ORDER — IOHEXOL 300 MG/ML  SOLN
100.0000 mL | Freq: Once | INTRAMUSCULAR | Status: AC | PRN
  Administered 2014-11-25: 100 mL via INTRAVENOUS

## 2014-11-25 MED ORDER — CIPROFLOXACIN IN D5W 400 MG/200ML IV SOLN
400.0000 mg | Freq: Once | INTRAVENOUS | Status: AC
  Administered 2014-11-25: 400 mg via INTRAVENOUS
  Filled 2014-11-25: qty 200

## 2014-11-25 NOTE — ED Notes (Signed)
Patient transported to CT 

## 2014-11-25 NOTE — ED Provider Notes (Signed)
TIME SEEN: 5:10 AM  CHIEF COMPLAINT: Abdominal pain, vomiting  HPI: Pt is a 29 y.o. female with history of gastroparesis, insulin-dependent diabetes, obesity, hypertension who presents emergency department with diffuse abdominal pain, vomiting started 2 days ago. She reports she has had subjective fevers and chills. Also reports she has had some diarrhea. No history of abdominal surgeries. She is currently on her menstrual cycle. Denies dysuria, hematuria. No vaginal discharge. No known sick contacts or recent travel.  ROS: See HPI Constitutional: no fever  Eyes: no drainage  ENT: no runny nose   Cardiovascular:  no chest pain  Resp: no SOB  GI:  vomiting GU: no dysuria Integumentary: no rash  Allergy: no hives  Musculoskeletal: no leg swelling  Neurological: no slurred speech ROS otherwise negative  PAST MEDICAL HISTORY/PAST SURGICAL HISTORY:  Past Medical History  Diagnosis Date  . Diabetes mellitus   . Ectopic pregnancy   . H. pylori infection   . GERD (gastroesophageal reflux disease)   . Morbid obesity with BMI of 50.0-59.9, adult 10/05/2011  . Essential hypertension, benign 11/01/2008  . E. coli sepsis   . Gastroparesis   . Pyelonephritis   . Pulmonary edema   . Fatty liver   . Ureteral obstruction   . Helicobacter pylori gastritis 09/07/2011  . Migraine     MEDICATIONS:  Prior to Admission medications   Medication Sig Start Date End Date Taking? Authorizing Provider  Blood Glucose Monitoring Suppl (TRUE TRACK BLOOD GLUCOSE) DEVI 1 Units by Does not apply route once. 12/09/13   Elby Beck, FNP  butalbital-acetaminophen-caffeine (FIORICET, ESGIC) [phone] MG per tablet Take 1 tablet by mouth every 6 (six) hours as needed for headache. 12/17/13   Star Age, MD  ciprofloxacin (CIPRO) 250 MG tablet Take 1 tablet (250 mg total) by mouth every 12 (twelve) hours. 04/09/14   Harden Mo, MD  clobetasol cream (TEMOVATE) [id number] % Apply 1 application topically 2 (two) times  daily. 02/23/14   Elby Beck, FNP  diphenoxylate-atropine (LOMOTIL) 2.5-0.025 MG per tablet Take 1 tablet by mouth 4 (four) times daily as needed for diarrhea or loose stools. 12/07/13   Gatha Mayer, MD  fluconazole (DIFLUCAN) 150 MG tablet Take 1 tablet (150 mg total) by mouth once. Repeat if needed 02/23/14   Elby Beck, FNP  glucose blood test strip Use as instructed 12/09/13   Elby Beck, FNP  insulin glargine (LANTUS) 100 UNIT/ML injection Inject 0.2 mLs (20 Units total) into the skin at bedtime. 12/09/13   Elby Beck, FNP  metFORMIN (GLUMETZA) 500 MG (MOD) 24 hr tablet Take 1/2 tablet by mouth once a day for 1 week, then increase to 1 tablet once a day 02/23/14   Elby Beck, FNP  metroNIDAZOLE (FLAGYL) 500 MG tablet Take 1 tablet (500 mg total) by mouth 2 (two) times daily with a meal. DO NOT CONSUME ALCOHOL WHILE TAKING THIS MEDICATION. 02/23/14   Elby Beck, FNP  ondansetron (ZOFRAN-ODT) 4 MG disintegrating tablet Take 1 tablet (4 mg total) by mouth every 8 (eight) hours as needed for nausea or vomiting. 12/17/13   Star Age, MD  sulfamethoxazole-trimethoprim (SEPTRA DS) 800-160 MG per tablet Take 1 tablet by mouth every 12 (twelve) hours. 04/07/14   Liam Graham, PA-C  traMADol (ULTRAM) 50 MG tablet Take 1 tablet (50 mg total) by mouth every 6 (six) hours as needed. 04/07/14   Liam Graham, PA-C  UNIFINE PENTIPS 31G X 8 MM MISC  USE AS DIRECTED WITH LANTUS SOLOSTAR 04/11/14   Darlyne Russian, MD    ALLERGIES:  Allergies  Allergen Reactions  . Penicillins Anaphylaxis    SOCIAL HISTORY:  History  Substance Use Topics  . Smoking status: Never Smoker   . Smokeless tobacco: Never Used  . Alcohol Use: Yes     Comment: occasional    FAMILY HISTORY: Family History  Problem Relation Age of Onset  . Arthritis Mother   . Asthma Mother   . COPD Mother   . Depression Mother   . Diabetes Mother   . Hypertension Mother   . Mental illness Mother    . Kidney disease Mother   . Crohn's disease Mother   . Mental illness Sister   . Arthritis Maternal Aunt   . Asthma Maternal Aunt   . COPD Maternal Aunt   . Depression Maternal Aunt   . Diabetes Maternal Aunt   . Hypertension Maternal Aunt   . Mental illness Maternal Aunt   . Stroke Maternal Aunt   . Heart failure Maternal Aunt   . Heart failure Maternal Grandfather   . Cancer Maternal Grandfather   . Diabetes Maternal Grandfather   . Heart disease Maternal Grandfather   . Hypertension Maternal Grandfather   . Hyperlipidemia Maternal Grandfather   . Aneurysm Father 38    brain  . Mental illness Brother   . Diabetes Maternal Grandmother   . Heart disease Maternal Grandmother   . Hypertension Maternal Grandmother   . Hyperlipidemia Maternal Grandmother   . Diabetes Paternal Grandmother   . Heart disease Paternal Grandmother   . Diabetes Paternal Grandfather   . Cancer Paternal Grandfather     EXAM: BP 123/97 mmHg  Pulse 120  Temp(Src) 98.8 F (37.1 C) (Oral)  Resp 14  SpO2 97%  LMP 11/22/2014 (Exact Date) CONSTITUTIONAL: Alert and oriented and responds appropriately to questions. Morbidly obese, appears uncomfortable but is nontoxic and afebrile HEAD: Normocephalic EYES: Conjunctivae clear, PERRL ENT: normal nose; no rhinorrhea; dry mucous membranes; pharynx without lesions noted NECK: Supple, no meningismus, no LAD  CARD: RRR; S1 and S2 appreciated; no murmurs, no clicks, no rubs, no gallops RESP: Normal chest excursion without splinting or tachypnea; breath sounds clear and equal bilaterally; no wheezes, no rhonchi, no rales, no hypoxia or respiratory distress, speaking full sentences ABD/GI: Normal bowel sounds; non-distended; soft, diffusely tender to palpation worse in the right abdomen without guarding or rebound, no peritoneal signs BACK:  The back appears normal and is non-tender to palpation, there is no CVA tenderness EXT: Normal ROM in all joints; non-tender  to palpation; no edema; normal capillary refill; no cyanosis, no calf tenderness or swelling    SKIN: Normal color for age and race; warm NEURO: Moves all extremities equally, sensation to light touch intact diffusely, cranial nerves II through XII intact PSYCH: The patient's mood and manner are appropriate. Grooming and personal hygiene are appropriate.  MEDICAL DECISION MAKING: Patient here with abdominal pain, vomiting and diarrhea. She is tachycardic but afebrile. We'll obtain labs, CT of her abdomen and pelvis, urine. Will give IV fluids, Zofran, morphine and reassess.  ED PROGRESS: Labs show mild leukocytosis of 10.7 which appears to be chronic for patient. Her glucose is elevated at 426 but she has normal bicarbonate and normal anion gap. We'll recheck CBG after 2 L of IV fluids. Her lactate is also mildly elevated but suspect this is due to dehydration. We'll recheck this as well after 2 L of  IV fluids. CT scan shows no acute abnormality. Appendix is well-visualized and appears normal. Urine does show blood, leukocytes. Hematuria may be secondary to her menstrual cycle but will treat her for possible UTI. Cultures pending. Will give dose of Cipro in the ED.   Plan is to repeat lactate, CBG after 2 L IV fluids. These have improved, feel she can be discharged him with Cipro for her UTI. Discussed with patient return precautions and supportive care instructions. She verbalized understanding and is comfortable with this plan. She is drinking without difficulty. No further vomiting or diarrhea in the ED.   EKG Interpretation  Date/Time:  Thursday November 25 2014 05:28:47 EDT Ventricular Rate:  102 PR Interval:  128 QRS Duration: 86 QT Interval:  341 QTC Calculation: 444 R Axis:   80 Text Interpretation:  Sinus tachycardia No significant change since last tracing Confirmed by Deneise Getty,  DO, Teagen Bucio [id number]) on 11/25/2014 5:38:56 AM        [place], DO 11/25/14 [id number]

## 2014-11-25 NOTE — ED Notes (Signed)
Waiting on pt fluids to complete to draw I-stat and check CBG

## 2014-11-25 NOTE — Discharge Instructions (Signed)
If your urine culture show signs of infection we will call you in an antibiotic.  Diarrhea Diarrhea is frequent loose and watery bowel movements. It can cause you to feel weak and dehydrated. Dehydration can cause you to become tired and thirsty, have a dry mouth, and have decreased urination that often is dark yellow. Diarrhea is a sign of another problem, most often an infection that will not last long. In most cases, diarrhea typically lasts 2-3 days. However, it can last longer if it is a sign of something more serious. It is important to treat your diarrhea as directed by your caregiver to lessen or prevent future episodes of diarrhea. CAUSES  Some common causes include:  Gastrointestinal infections caused by viruses, bacteria, or parasites.  Food poisoning or food allergies.  Certain medicines, such as antibiotics, chemotherapy, and laxatives.  Artificial sweeteners and fructose.  Digestive disorders. HOME CARE INSTRUCTIONS  Ensure adequate fluid intake (hydration): Have 1 cup (8 oz) of fluid for each diarrhea episode. Avoid fluids that contain simple sugars or sports drinks, fruit juices, whole milk products, and sodas. Your urine should be clear or pale yellow if you are drinking enough fluids. Hydrate with an oral rehydration solution that you can purchase at pharmacies, retail stores, and online. You can prepare an oral rehydration solution at home by mixing the following ingredients together:   - tsp table salt.   tsp baking soda.   tsp salt substitute containing potassium chloride.  1  tablespoons sugar.  1 L (34 oz) of water.  Certain foods and beverages may increase the speed at which food moves through the gastrointestinal (GI) tract. These foods and beverages should be avoided and include:  Caffeinated and alcoholic beverages.  High-fiber foods, such as raw fruits and vegetables, nuts, seeds, and whole grain breads and cereals.  Foods and beverages sweetened with  sugar alcohols, such as xylitol, sorbitol, and mannitol.  Some foods may be well tolerated and may help thicken stool including:  Starchy foods, such as rice, toast, pasta, low-sugar cereal, oatmeal, grits, baked potatoes, crackers, and bagels.  Bananas.  Applesauce.  Add probiotic-rich foods to help increase healthy bacteria in the GI tract, such as yogurt and fermented milk products.  Wash your hands well after each diarrhea episode.  Only take over-the-counter or prescription medicines as directed by your caregiver.  Take a warm bath to relieve any burning or pain from frequent diarrhea episodes. SEEK IMMEDIATE MEDICAL CARE IF:   You are unable to keep fluids down.  You have persistent vomiting.  You have blood in your stool, or your stools are black and tarry.  You do not urinate in 6-8 hours, or there is only a small amount of very dark urine.  You have abdominal pain that increases or localizes.  You have weakness, dizziness, confusion, or light-headedness.  You have a severe headache.  Your diarrhea gets worse or does not get better.  You have a fever or persistent symptoms for more than 2-3 days.  You have a fever and your symptoms suddenly get worse. MAKE SURE YOU:   Understand these instructions.  Will watch your condition.  Will get help right away if you are not doing well or get worse. Document Released: 04/13/2002 Document Revised: 09/07/2013 Document Reviewed: 12/30/2011 [facility] Patient Information 2015 [place], [place]. This information is not intended to replace advice given to you by your health care provider. Make sure you discuss any questions you have with your health care provider.  Nausea and Vomiting Nausea is a sick feeling that often comes before throwing up (vomiting). Vomiting is a reflex where stomach contents come out of your mouth. Vomiting can cause severe loss of body fluids (dehydration). Children and elderly adults can become  dehydrated quickly, especially if they also have diarrhea. Nausea and vomiting are symptoms of a condition or disease. It is important to find the cause of your symptoms. CAUSES   Direct irritation of the stomach lining. This irritation can result from increased acid production (gastroesophageal reflux disease), infection, food poisoning, taking certain medicines (such as nonsteroidal anti-inflammatory drugs), alcohol use, or tobacco use.  Signals from the brain.These signals could be caused by a headache, heat exposure, an inner ear disturbance, increased pressure in the brain from injury, infection, a tumor, or a concussion, pain, emotional stimulus, or metabolic problems.  An obstruction in the gastrointestinal tract (bowel obstruction).  Illnesses such as diabetes, hepatitis, gallbladder problems, appendicitis, kidney problems, cancer, sepsis, atypical symptoms of a heart attack, or eating disorders.  Medical treatments such as chemotherapy and radiation.  Receiving medicine that makes you sleep (general anesthetic) during surgery. DIAGNOSIS Your caregiver may ask for tests to be done if the problems do not improve after a few days. Tests may also be done if symptoms are severe or if the reason for the nausea and vomiting is not clear. Tests may include:  Urine tests.  Blood tests.  Stool tests.  Cultures (to look for evidence of infection).  X-rays or other imaging studies. Test results can help your caregiver make decisions about treatment or the need for additional tests. TREATMENT You need to stay well hydrated. Drink frequently but in small amounts.You may wish to drink water, sports drinks, clear broth, or eat frozen ice pops or gelatin dessert to help stay hydrated.When you eat, eating slowly may help prevent nausea.There are also some antinausea medicines that may help prevent nausea. HOME CARE INSTRUCTIONS   Take all medicine as directed by your caregiver.  If you do  not have an appetite, do not force yourself to eat. However, you must continue to drink fluids.  If you have an appetite, eat a normal diet unless your caregiver tells you differently.  Eat a variety of complex carbohydrates (rice, wheat, potatoes, bread), lean meats, yogurt, fruits, and vegetables.  Avoid high-fat foods because they are more difficult to digest.  Drink enough water and fluids to keep your urine clear or pale yellow.  If you are dehydrated, ask your caregiver for specific rehydration instructions. Signs of dehydration may include:  Severe thirst.  Dry lips and mouth.  Dizziness.  Dark urine.  Decreasing urine frequency and amount.  Confusion.  Rapid breathing or pulse. SEEK IMMEDIATE MEDICAL CARE IF:   You have blood or brown flecks (like coffee grounds) in your vomit.  You have black or bloody stools.  You have a severe headache or stiff neck.  You are confused.  You have severe abdominal pain.  You have chest pain or trouble breathing.  You do not urinate at least once every 8 hours.  You develop cold or clammy skin.  You continue to vomit for longer than 24 to 48 hours.  You have a fever. MAKE SURE YOU:   Understand these instructions.  Will watch your condition.  Will get help right away if you are not doing well or get worse. Document Released: 04/23/2005 Document Revised: 07/16/2011 Document Reviewed: 09/20/2010 [facility] Patient Information 2015 [place], [place]. This information is not  intended to replace advice given to you by your health care provider. Make sure you discuss any questions you have with your health care provider.  Urinary Tract Infection Urinary tract infections (UTIs) can develop anywhere along your urinary tract. Your urinary tract is your body's drainage system for removing wastes and extra water. Your urinary tract includes two kidneys, two ureters, a bladder, and a urethra. Your kidneys are a pair of bean-shaped  organs. Each kidney is about the size of your fist. They are located below your ribs, one on each side of your spine. CAUSES Infections are caused by microbes, which are microscopic organisms, including fungi, viruses, and bacteria. These organisms are so small that they can only be seen through a microscope. Bacteria are the microbes that most commonly cause UTIs. SYMPTOMS  Symptoms of UTIs may vary by age and gender of the patient and by the location of the infection. Symptoms in young women typically include a frequent and intense urge to urinate and a painful, burning feeling in the bladder or urethra during urination. Older women and men are more likely to be tired, shaky, and weak and have muscle aches and abdominal pain. A fever may mean the infection is in your kidneys. Other symptoms of a kidney infection include pain in your back or sides below the ribs, nausea, and vomiting. DIAGNOSIS To diagnose a UTI, your caregiver will ask you about your symptoms. Your caregiver also will ask to provide a urine sample. The urine sample will be tested for bacteria and white blood cells. White blood cells are made by your body to help fight infection. TREATMENT  Typically, UTIs can be treated with medication. Because most UTIs are caused by a bacterial infection, they usually can be treated with the use of antibiotics. The choice of antibiotic and length of treatment depend on your symptoms and the type of bacteria causing your infection. HOME CARE INSTRUCTIONS  If you were prescribed antibiotics, take them exactly as your caregiver instructs you. Finish the medication even if you feel better after you have only taken some of the medication.  Drink enough water and fluids to keep your urine clear or pale yellow.  Avoid caffeine, tea, and carbonated beverages. They tend to irritate your bladder.  Empty your bladder often. Avoid holding urine for long periods of time.  Empty your bladder before and  after sexual intercourse.  After a bowel movement, women should cleanse from front to back. Use each tissue only once. SEEK MEDICAL CARE IF:   You have back pain.  You develop a fever.  Your symptoms do not begin to resolve within 3 days. SEEK IMMEDIATE MEDICAL CARE IF:   You have severe back pain or lower abdominal pain.  You develop chills.  You have nausea or vomiting.  You have continued burning or discomfort with urination. MAKE SURE YOU:   Understand these instructions.  Will watch your condition.  Will get help right away if you are not doing well or get worse. Document Released: 01/31/2005 Document Revised: 10/23/2011 Document Reviewed: 06/01/2011 [facility] Patient Information 2015 [place], [place]. This information is not intended to replace advice given to you by your health care provider. Make sure you discuss any questions you have with your health care provider.   Hyperglycemia Hyperglycemia occurs when the glucose (sugar) in your blood is too high. Hyperglycemia can happen for many reasons, but it most often happens to people who do not know they have diabetes or are not managing their  diabetes properly.  CAUSES  Whether you have diabetes or not, there are other causes of hyperglycemia. Hyperglycemia can occur when you have diabetes, but it can also occur in other situations that you might not be as aware of, such as: Diabetes  If you have diabetes and are having problems controlling your blood glucose, hyperglycemia could occur because of some of the following reasons:  Not following your meal plan.  Not taking your diabetes medications or not taking it properly.  Exercising less or doing less activity than you normally do.  Being sick. Pre-diabetes  This cannot be ignored. Before people develop Type 2 diabetes, they almost always have "pre-diabetes." This is when your blood glucose levels are higher than normal, but not yet high enough to be diagnosed as  diabetes. Research has shown that some long-term damage to the body, especially the heart and circulatory system, may already be occurring during pre-diabetes. If you take action to manage your blood glucose when you have pre-diabetes, you may delay or prevent Type 2 diabetes from developing. Stress  If you have diabetes, you may be "diet" controlled or on oral medications or insulin to control your diabetes. However, you may find that your blood glucose is higher than usual in the hospital whether you have diabetes or not. This is often referred to as "stress hyperglycemia." Stress can elevate your blood glucose. This happens because of hormones put out by the body during times of stress. If stress has been the cause of your high blood glucose, it can be followed regularly by your caregiver. That way he/she can make sure your hyperglycemia does not continue to get worse or progress to diabetes. Steroids  Steroids are medications that act on the infection fighting system (immune system) to block inflammation or infection. One side effect can be a rise in blood glucose. Most people can produce enough extra insulin to allow for this rise, but for those who cannot, steroids make blood glucose levels go even higher. It is not unusual for steroid treatments to "uncover" diabetes that is developing. It is not always possible to determine if the hyperglycemia will go away after the steroids are stopped. A special blood test called an A1c is sometimes done to determine if your blood glucose was elevated before the steroids were started. SYMPTOMS  Thirsty.  Frequent urination.  Dry mouth.  Blurred vision.  Tired or fatigue.  Weakness.  Sleepy.  Tingling in feet or leg. DIAGNOSIS  Diagnosis is made by monitoring blood glucose in one or all of the following ways:  A1c test. This is a chemical found in your blood.  Fingerstick blood glucose monitoring.  Laboratory results. TREATMENT  First,  knowing the cause of the hyperglycemia is important before the hyperglycemia can be treated. Treatment may include, but is not be limited to:  Education.  Change or adjustment in medications.  Change or adjustment in meal plan.  Treatment for an illness, infection, etc.  More frequent blood glucose monitoring.  Change in exercise plan.  Decreasing or stopping steroids.  Lifestyle changes. HOME CARE INSTRUCTIONS   Test your blood glucose as directed.  Exercise regularly. Your caregiver will give you instructions about exercise. Pre-diabetes or diabetes which comes on with stress is helped by exercising.  Eat wholesome, balanced meals. Eat often and at regular, fixed times. Your caregiver or nutritionist will give you a meal plan to guide your sugar intake.  Being at an ideal weight is important. If needed, losing as little  as 10 to 15 pounds may help improve blood glucose levels. SEEK MEDICAL CARE IF:   You have questions about medicine, activity, or diet.  You continue to have symptoms (problems such as increased thirst, urination, or weight gain). SEEK IMMEDIATE MEDICAL CARE IF:   You are vomiting or have diarrhea.  Your breath smells fruity.  You are breathing faster or slower.  You are very sleepy or incoherent.  You have numbness, tingling, or pain in your feet or hands.  You have chest pain.  Your symptoms get worse even though you have been following your caregiver's orders.  If you have any other questions or concerns. Document Released: 10/17/2000 Document Revised: 07/16/2011 Document Reviewed: 08/20/2011 [facility] Patient Information 2015 [place], [place]. This information is not intended to replace advice given to you by your health care provider. Make sure you discuss any questions you have with your health care provider.

## 2014-11-25 NOTE — ED Notes (Signed)
Patient with a two day history of abdominal pain and vomiting.  Patient states that the pain and vomiting subsided yesterday.  Patient went to bed, woke up this morning with worse abdominal pain and vomiting.  She states it is worse pain that she has ever had.  Patient is tearful, mom at bedside.  Patient has vomited at least 5 times since waking up and states that the pain is central abdomen.

## 2014-11-28 LAB — URINE CULTURE: Culture: >=100000

## 2014-11-29 ENCOUNTER — Telehealth (HOSPITAL_COMMUNITY): Payer: Self-pay

## 2014-11-29 NOTE — ED Notes (Signed)
Post ED Visit - Positive Culture Follow-up  Culture report reviewed by antimicrobial stewardship pharmacist: []  Wes Dulaney, Pharm.D., BCPS []  Heide Guile, Pharm.D., BCPS []  Alycia Rossetti, Pharm.D., BCPS []  [place], Pharm.D., BCPS, AAHIVP []  Legrand Como, Pharm.D., BCPS, AAHIVP []  Isac Sarna, Pharm.D., BCPS X Cassie Stewart Pharm D  Positive urine culture Treated with cipro, organism sensitive to the same and no further patient follow-up is required at this time.  Ileene Musa 11/29/2014, 10:12 AM

## 2014-12-09 ENCOUNTER — Encounter: Payer: Self-pay | Admitting: *Deleted

## 2015-03-14 ENCOUNTER — Encounter: Payer: 59 | Admitting: Family Medicine

## 2015-04-17 ENCOUNTER — Encounter (HOSPITAL_COMMUNITY): Payer: Self-pay

## 2015-04-17 ENCOUNTER — Emergency Department (HOSPITAL_COMMUNITY)
Admission: EM | Admit: 2015-04-17 | Discharge: 2015-04-17 | Disposition: A | Discharge status: Home / Self Care | Payer: 59 | Source: Home / Self Care | Attending: Family Medicine | Admitting: Family Medicine

## 2015-04-17 DIAGNOSIS — J0100 Acute maxillary sinusitis, unspecified: Secondary | ICD-10-CM

## 2015-04-17 DIAGNOSIS — J029 Acute pharyngitis, unspecified: Secondary | ICD-10-CM | POA: Diagnosis not present

## 2015-04-17 LAB — POCT RAPID STREP A: STREPTOCOCCUS, GROUP A SCREEN (DIRECT): NEGATIVE

## 2015-04-17 MED ORDER — NAPROXEN 500 MG PO TABS: 500.0000 mg | ORAL_TABLET | Freq: Two times a day (BID) | ORAL | Status: DC

## 2015-04-17 MED ORDER — CLINDAMYCIN HCL 300 MG PO CAPS: 300.0000 mg | ORAL_CAPSULE | Freq: Three times a day (TID) | ORAL | Status: DC

## 2015-04-17 MED ORDER — AZITHROMYCIN 250 MG PO TABS: 250.0000 mg | ORAL_TABLET | Freq: Every day | ORAL | Status: DC

## 2015-04-17 MED ORDER — NAPROXEN 500 MG PO TABS
500.0000 mg | ORAL_TABLET | Freq: Two times a day (BID) | ORAL | Status: DC
Start: 2015-04-17 — End: 2015-07-11

## 2015-04-17 NOTE — ED Provider Notes (Signed)
CSN: [id number]     Arrival date & time 04/17/15  1440 History   First MD Initiated Contact with Patient 04/17/15 1610     Chief Complaint  Patient presents with  . Sore Throat   (Consider location/radiation/quality/duration/timing/severity/associated sxs/prior Treatment) Patient is a 29 y.o. female presenting with pharyngitis. The history is provided by the patient. No language interpreter was used.  Sore Throat This is a new problem. The current episode started 2 days ago. The problem occurs constantly. The problem has been gradually worsening. Associated symptoms include headaches. Pertinent negatives include no chest pain and no shortness of breath. Nothing aggravates the symptoms. The symptoms are relieved by NSAIDs. The treatment provided no relief.  Nose pain: C/O pain and pressure on her nose and face which started 2 weeks ago now worsening. She can feel pain radiating from her nose to the back of her eyes. She also has a swelling of her left nostril which is causing irritation and discomfort. The swelling started withrted with a bump which she popped. Denies nasal congestion.  Past Medical History  Diagnosis Date  . Diabetes mellitus   . Ectopic pregnancy   . H. pylori infection   . GERD (gastroesophageal reflux disease)   . Morbid obesity with BMI of 50.0-59.9, adult ([place]) 10/05/2011  . Essential hypertension, benign 11/01/2008  . E. coli sepsis ([place])   . Gastroparesis   . Pyelonephritis   . Pulmonary edema   . Fatty liver   . Ureteral obstruction   . Helicobacter pylori gastritis 09/07/2011  . Migraine    Past Surgical History  Procedure Laterality Date  . Esophagogastroduodenoscopy  2010   Family History  Problem Relation Age of Onset  . Arthritis Mother   . Asthma Mother   . COPD Mother   . Depression Mother   . Diabetes Mother   . Hypertension Mother   . Mental illness Mother   . Kidney disease Mother   . Crohn's disease Mother   . Mental illness Sister   .  Arthritis Maternal Aunt   . Asthma Maternal Aunt   . COPD Maternal Aunt   . Depression Maternal Aunt   . Diabetes Maternal Aunt   . Hypertension Maternal Aunt   . Mental illness Maternal Aunt   . Stroke Maternal Aunt   . Heart failure Maternal Aunt   . Heart failure Maternal Grandfather   . Cancer Maternal Grandfather   . Diabetes Maternal Grandfather   . Heart disease Maternal Grandfather   . Hypertension Maternal Grandfather   . Hyperlipidemia Maternal Grandfather   . Aneurysm Father 31    brain  . Mental illness Brother   . Diabetes Maternal Grandmother   . Heart disease Maternal Grandmother   . Hypertension Maternal Grandmother   . Hyperlipidemia Maternal Grandmother   . Diabetes Paternal Grandmother   . Heart disease Paternal Grandmother   . Diabetes Paternal Grandfather   . Cancer Paternal Grandfather    Social History  Substance Use Topics  . Smoking status: Never Smoker   . Smokeless tobacco: Never Used  . Alcohol Use: Yes     Comment: occasional   OB History    Gravida Para Term Preterm AB TAB SAB Ectopic Multiple Living   1    1   1   0     Review of Systems  HENT: Positive for sinus pressure and sore throat. Negative for ear discharge, ear pain, nosebleeds and trouble swallowing.   Respiratory: Negative.  Negative for  shortness of breath.   Cardiovascular: Negative for chest pain.  Neurological: Positive for headaches.  All other systems reviewed and are negative.   Allergies  Penicillins  Home Medications   Prior to Admission medications   Medication Sig Start Date End Date Taking? Authorizing Provider  Blood Glucose Monitoring Suppl (TRUE TRACK BLOOD GLUCOSE) DEVI 1 Units by Does not apply route once. 12/09/13   Elby Beck, FNP  ciprofloxacin (CIPRO) 500 MG tablet Take 1 tablet (500 mg total) by mouth 2 (two) times daily. 11/25/14   Kristen N Ward, DO  glucose blood test strip Use as instructed 12/09/13   Elby Beck, FNP    HYDROcodone-acetaminophen (NORCO/VICODIN) 5-325 MG per tablet Take 1 tablet by mouth every 4 (four) hours as needed. 11/25/14   Kristen N Ward, DO  HYDROcodone-acetaminophen (NORCO/VICODIN) 5-325 MG per tablet Take 2 tablets by mouth every 4 (four) hours as needed. 11/25/14   Tanna Furry, MD  ibuprofen (ADVIL,MOTRIN) 200 MG tablet Take 200 mg by mouth every 6 (six) hours as needed for mild pain.    Historical Provider, MD  insulin glargine (LANTUS) 100 UNIT/ML injection Inject 0.2 mLs (20 Units total) into the skin at bedtime. Patient not taking: Reported on 11/25/2014 12/09/13   Elby Beck, FNP  metFORMIN (GLUMETZA) 500 MG (MOD) 24 hr tablet Take 1/2 tablet by mouth once a day for 1 week, then increase to 1 tablet once a day Patient not taking: Reported on 11/25/2014 02/23/14   Elby Beck, FNP  ondansetron (ZOFRAN ODT) 4 MG disintegrating tablet Take 1 tablet (4 mg total) by mouth every 8 (eight) hours as needed for nausea or vomiting. 11/25/14   Kristen N Ward, DO  ondansetron (ZOFRAN ODT) 4 MG disintegrating tablet Take 1 tablet (4 mg total) by mouth every 8 (eight) hours as needed for nausea. 11/25/14   Tanna Furry, MD  UNIFINE PENTIPS 31G X 8 MM MISC USE AS DIRECTED WITH LANTUS SOLOSTAR 04/11/14   Darlyne Russian, MD   Meds Ordered and Administered this Visit  Medications - No data to display  BP 144/68 mmHg  Pulse 72  Temp(Src) 100 F (37.8 C) (Oral)  Resp 16  SpO2 98% No data found.   Physical Exam  Constitutional: She appears well-developed. No distress.  HENT:  Head: Normocephalic.  Right Ear: External ear normal.  Left Ear: External ear normal.  Nose: Mucosal edema and sinus tenderness present. No septal deviation or nasal septal hematoma. Right sinus exhibits maxillary sinus tenderness. Left sinus exhibits maxillary sinus tenderness.    Eyes: Conjunctivae are normal. Pupils are equal, round, and reactive to light. Right eye exhibits no discharge.  Neck: Neck supple.   Cardiovascular: Normal rate, regular rhythm, normal heart sounds and intact distal pulses.   No murmur heard. Pulmonary/Chest: Effort normal and breath sounds normal. No respiratory distress. She has no wheezes.  Nursing note and vitals reviewed.   ED Course  Procedures (including critical care time)  Labs Review Labs Reviewed  POCT RAPID STREP A    Imaging Review No results found.   Visual Acuity Review  Right Eye Distance:   Left Eye Distance:   Bilateral Distance:    Right Eye Near:   Left Eye Near:    Bilateral Near:         MDM  No diagnosis found. Acute maxillary sinusitis, recurrence not specified  Pharyngitis   Sinus symptoms lingering on for over 2 wks now worsening. I will treat  with antibiotic. Clindamycin given. She is allergic to penicillin. I also prescribed naproxen prn pain.  Rapid strep test negative. May use Naproxen for ST as well. Return precaution discussed.    Kinnie Feil, MD 04/17/15 [phone]

## 2015-04-17 NOTE — Discharge Instructions (Signed)
Sinus Rinse WHAT IS A SINUS RINSE? A sinus rinse is a simple home treatment that is used to rinse your sinuses with a sterile mixture of salt and water (saline solution). Sinuses are air-filled spaces in your skull behind the bones of your face and forehead that open into your nasal cavity. You will use the following:  Saline solution.  Neti pot or spray bottle. This releases the saline solution into your nose and through your sinuses. Neti pots and spray bottles can be purchased at [employer], a health food store, or online. WHEN WOULD I DO A SINUS RINSE? A sinus rinse can help to clear mucus, dirt, dust, or pollen from the nasal cavity. You may do a sinus rinse when you have a cold, a virus, nasal allergy symptoms, a sinus infection, or stuffiness in the nose or sinuses. If you are considering a sinus rinse:  Ask your child's health care provider before performing a sinus rinse on your child.  Do not do a sinus rinse if you have had ear or nasal surgery, ear infection, or blocked ears. HOW DO I DO A SINUS RINSE?  Wash your hands.  Disinfect your device according to the directions provided and then dry it.  Use the solution that comes with your device or one that is sold separately in stores. Follow the mixing directions on the package.  Fill your device with the amount of saline solution as directed by the device instructions.  Stand over a sink and tilt your head sideways over the sink.  Place the spout of the device in your upper nostril (the one closer to the ceiling).  Gently pour or squeeze the saline solution into the nasal cavity. The liquid should drain to the lower nostril if you are not overly congested.  Gently blow your nose. Blowing too hard may cause ear pain.  Repeat in the other nostril.  Clean and rinse your device with clean water and then air-dry it. ARE THERE RISKS OF A SINUS RINSE?  Sinus rinse is generally very safe and effective. However, there  are a few risks, which include:   A burning sensation in the sinuses. This may happen if you do not make the saline solution as directed. Make sure to follow all directions when making the saline solution.  Infection from contaminated water. This is rare, but possible.  Nasal irritation.   This information is not intended to replace advice given to you by your health care provider. Make sure you discuss any questions you have with your health care provider.   Document Released: 11/18/2013 Document Reviewed: 11/18/2013 Elsevier Interactive Patient Education [employer].

## 2015-04-17 NOTE — ED Notes (Signed)
Patient complains of cough congestion sinus pressure and sore throat Patient also complains of having a sore to the inside of her left nostril

## 2015-04-19 ENCOUNTER — Ambulatory Visit
Admission: RE | Admit: 2015-04-19 | Discharge: 2015-04-19 | Disposition: A | Discharge status: Home / Self Care | Payer: 59 | Source: Ambulatory Visit | Attending: Emergency Medicine | Admitting: Emergency Medicine

## 2015-04-19 ENCOUNTER — Other Ambulatory Visit: Payer: Self-pay | Admitting: Emergency Medicine

## 2015-04-19 ENCOUNTER — Ambulatory Visit (INDEPENDENT_AMBULATORY_CARE_PROVIDER_SITE_OTHER): Payer: 59 | Admitting: Emergency Medicine

## 2015-04-19 VITALS — BP 114/84 | HR 125 | Temp 99.1°F | Resp 16 | Ht 63.0 in | Wt 256.6 lb

## 2015-04-19 DIAGNOSIS — J0100 Acute maxillary sinusitis, unspecified: Secondary | ICD-10-CM

## 2015-04-19 DIAGNOSIS — R220 Localized swelling, mass and lump, head: Secondary | ICD-10-CM

## 2015-04-19 DIAGNOSIS — L500 Allergic urticaria: Secondary | ICD-10-CM | POA: Diagnosis not present

## 2015-04-19 DIAGNOSIS — T887XXA Unspecified adverse effect of drug or medicament, initial encounter: Secondary | ICD-10-CM

## 2015-04-19 DIAGNOSIS — T50905A Adverse effect of unspecified drugs, medicaments and biological substances, initial encounter: Secondary | ICD-10-CM

## 2015-04-19 MED ORDER — IOPAMIDOL (ISOVUE-300) INJECTION 61%
75.0000 mL | Freq: Once | INTRAVENOUS | Status: AC | PRN
  Administered 2015-04-19: 75 mL via INTRAVENOUS

## 2015-04-19 MED ORDER — LEVOFLOXACIN 500 MG PO TABS: 500.0000 mg | ORAL_TABLET | Freq: Every day | ORAL | Status: AC

## 2015-04-19 NOTE — Patient Instructions (Signed)

## 2015-04-19 NOTE — Progress Notes (Signed)
Subjective:  Patient ID: Sheila Austin, female    DOB: 1985-11-11  Age: 29 y.o. MRN: [id number]  CC: Allergic Reaction   HPI Sheila Austin presents  following a visit to urgent care at which time she was found to have pain in her left cheek with facial swelling. She said that when she pressed on her cheek she had pus drained from her nostril. She was put on clindamycin and sent home. She's had no fever or chills. She has no thermal sensitivity in her teeth. Now she still has a rash presumably related to clindamycin. She has assistant but improved left cheek swelling with no nasal drainage or postnasal drip. She has no nausea vomiting cough coryza or other complaints   History Sheila Austin has a past medical history of Diabetes mellitus; Ectopic pregnancy; H. pylori infection; GERD (gastroesophageal reflux disease); Morbid obesity with BMI of 50.0-59.9, adult ([place]) (10/05/2011); Essential hypertension, benign (11/01/2008); E. coli sepsis ([place]); Gastroparesis; Pyelonephritis; Pulmonary edema; Fatty liver; Ureteral obstruction; Helicobacter pylori gastritis (09/07/2011); and Migraine.   She has past surgical history that includes Esophagogastroduodenoscopy (2010).   Her  family history includes Aneurysm (age of onset: 78) in her father; Arthritis in her maternal aunt and mother; Asthma in her maternal aunt and mother; COPD in her maternal aunt and mother; Cancer in her maternal grandfather and paternal grandfather; Crohn's disease in her mother; Depression in her maternal aunt and mother; Diabetes in her maternal aunt, maternal grandfather, maternal grandmother, mother, paternal grandfather, and paternal grandmother; Heart disease in her maternal grandfather, maternal grandmother, and paternal grandmother; Heart failure in her maternal aunt and maternal grandfather; Hyperlipidemia in her maternal grandfather and maternal grandmother; Hypertension in her maternal aunt, maternal grandfather, maternal  grandmother, and mother; Kidney disease in her mother; Mental illness in her brother, maternal aunt, mother, and sister; Stroke in her maternal aunt.  She   reports that she has never smoked. She has never used smokeless tobacco. She reports that she drinks alcohol. She reports that she does not use illicit drugs.  Outpatient Prescriptions Prior to Visit  Medication Sig Dispense Refill  . Blood Glucose Monitoring Suppl (TRUE TRACK BLOOD GLUCOSE) DEVI 1 Units by Does not apply route once. (Patient not taking: Reported on 04/19/2015) 1 each 0  . clindamycin (CLEOCIN) 300 MG capsule Take 1 capsule (300 mg total) by mouth 3 (three) times daily. (Patient not taking: Reported on 04/19/2015) 21 capsule 0  . glucose blood test strip Use as instructed (Patient not taking: Reported on 04/19/2015) 100 each 12  . HYDROcodone-acetaminophen (NORCO/VICODIN) 5-325 MG per tablet Take 1 tablet by mouth every 4 (four) hours as needed. (Patient not taking: Reported on 04/19/2015) 15 tablet 0  . HYDROcodone-acetaminophen (NORCO/VICODIN) 5-325 MG per tablet Take 2 tablets by mouth every 4 (four) hours as needed. (Patient not taking: Reported on 04/19/2015) 10 tablet 0  . ibuprofen (ADVIL,MOTRIN) 200 MG tablet Take 200 mg by mouth every 6 (six) hours as needed for mild pain.    Marland Kitchen insulin glargine (LANTUS) 100 UNIT/ML injection Inject 0.2 mLs (20 Units total) into the skin at bedtime. (Patient not taking: Reported on 11/25/2014) 10 mL 11  . metFORMIN (GLUMETZA) 500 MG (MOD) 24 hr tablet Take 1/2 tablet by mouth once a day for 1 week, then increase to 1 tablet once a day (Patient not taking: Reported on 11/25/2014) 30 tablet 2  . naproxen (NAPROSYN) 500 MG tablet Take 1 tablet (500 mg total) by mouth 2 (two) times  daily. (Patient not taking: Reported on 04/19/2015) 20 tablet 0  . UNIFINE PENTIPS 31G X 8 MM MISC USE AS DIRECTED WITH LANTUS SOLOSTAR (Patient not taking: Reported on 04/19/2015) 100 each 2  . ondansetron (ZOFRAN  ODT) 4 MG disintegrating tablet Take 1 tablet (4 mg total) by mouth every 8 (eight) hours as needed for nausea or vomiting. 20 tablet 0  . ondansetron (ZOFRAN ODT) 4 MG disintegrating tablet Take 1 tablet (4 mg total) by mouth every 8 (eight) hours as needed for nausea. 20 tablet 0   No facility-administered medications prior to visit.    Social History   Social History  . Marital Status: Married    Spouse Name: Roderic Palau  . Number of Children: 0  . Years of Education: 13   Occupational History  . administrative assist [employer]   Social History Main Topics  . Smoking status: Never Smoker   . Smokeless tobacco: Never Used  . Alcohol Use: Yes     Comment: occasional  . Drug Use: No  . Sexual Activity:    Partners: Male    Birth Control/ Protection: Condom     Comment: Pt is interested in taking BCP   Other Topics Concern  . 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  HENT: Positive for rhinorrhea and sinus pressure. Negative for congestion, ear pain and postnasal drip.   Eyes: Negative for pain and redness.  Respiratory: Negative for cough, shortness of breath and wheezing.   Cardiovascular: Negative for leg swelling.  Gastrointestinal: Negative for nausea, vomiting, abdominal pain, diarrhea, constipation and blood in stool.  Endocrine: Negative for polyuria.  Genitourinary: Negative for dysuria, urgency, frequency and flank pain.  Musculoskeletal: Negative for gait problem.  Skin: Negative for rash.  Neurological: Negative for weakness and headaches.  Psychiatric/Behavioral: Negative for confusion and decreased concentration. The patient is not nervous/anxious.     Objective:  BP 114/84 mmHg  Pulse 125  Temp(Src) 99.1 F (37.3 C) (Oral)  Resp  16  Ht 5\' 3"  (1.6 m)  Wt 256 lb 9.6 oz (116.393 kg)  BMI 45.47 kg/m2  SpO2 97%  LMP 03/03/2015 she has left cheek erythema  Physical Exam  Constitutional: She is oriented to person, place, and time. She appears well-developed and well-nourished. No distress.  HENT:  Head: Normocephalic and atraumatic.  Right Ear: External ear normal.  Left Ear: External ear normal.  Nose: Nose normal.  Eyes: Conjunctivae and EOM are normal. Pupils are equal, round, and reactive to light. No scleral icterus.  Neck: Normal range of motion. Neck supple. No tracheal deviation present.  Cardiovascular: Normal rate, regular rhythm and normal heart sounds.   Pulmonary/Chest: Effort normal. No respiratory distress. She has no wheezes. She has no rales.  Abdominal: She exhibits no mass. There is no tenderness. There is no rebound and no guarding.  Musculoskeletal: She exhibits no edema.  Lymphadenopathy:    She has no cervical adenopathy.  Neurological: She is alert and oriented to person, place, and time. Coordination normal.  Skin: Skin is warm and dry. No rash noted.  Psychiatric: She has a normal mood and affect. Her behavior is normal.   She has erythema the left cheek. She's rather tender. She has a poor  oral hygiene with gingivitis and badly decayed left maxillary molars     Assessment & Plan:   Robi was seen today for allergic reaction.  Diagnoses and all orders for this visit:  Left facial swelling -     Cancel: DG Sinuses Complete; Future -     Cancel: CT Maxillofacial WO CM; Future -     Cancel: CT Maxillofacial WO CM; Future  Adverse effects of medication, initial encounter  Allergic urticaria   I have discontinued Ms. Sherpa's ondansetron and ondansetron. I am also having her maintain her glucose blood, True Track Blood Glucose, insulin glargine, metFORMIN, UNIFINE PENTIPS, ibuprofen, HYDROcodone-acetaminophen, HYDROcodone-acetaminophen, naproxen, and clindamycin.  No orders of  the defined types were placed in this encounter.    Appropriate red flag conditions were discussed with the patient as well as actions that should be taken.  Patient expressed his understanding.  Follow-up: Return if symptoms worsen or fail to improve.  Roselee Culver, MD

## 2015-04-20 ENCOUNTER — Telehealth: Payer: Self-pay | Admitting: Family Medicine

## 2015-04-20 LAB — CULTURE, GROUP A STREP

## 2015-04-20 NOTE — Telephone Encounter (Signed)
Will forward to Dr. Eniola. Sheila Austin,CMA  

## 2015-04-20 NOTE — Telephone Encounter (Signed)
I called about test result but there was no response and I was unable to leave a message. Treatment recommendation for non- group A strep colony is penicillin but she is allergic to it. I gave Clinda which should be equally effective. F/U with PCP if no improvement.

## 2015-04-20 NOTE — Telephone Encounter (Signed)
I spoke with patient she is taking her new A/B. Return precaution discussed.

## 2015-04-20 NOTE — Telephone Encounter (Signed)
Returned dr Annett Gula call.

## 2015-07-11 ENCOUNTER — Ambulatory Visit (INDEPENDENT_AMBULATORY_CARE_PROVIDER_SITE_OTHER): Payer: 59 | Admitting: Family Medicine

## 2015-07-11 VITALS — BP 128/86 | HR 98 | Temp 97.9°F | Resp 17 | Ht 63.0 in | Wt 255.0 lb

## 2015-07-11 DIAGNOSIS — H1131 Conjunctival hemorrhage, right eye: Secondary | ICD-10-CM

## 2015-07-11 DIAGNOSIS — H01003 Unspecified blepharitis right eye, unspecified eyelid: Secondary | ICD-10-CM

## 2015-07-11 MED ORDER — TOBRAMYCIN 0.3 % OP SOLN: 1.0000 [drp] | Freq: Three times a day (TID) | OPHTHALMIC | Status: DC | PRN

## 2015-07-11 MED FILL — TOBRAMYCIN 0.3% EYE DROPS: 0.3 | 30 days supply | Qty: 5 | Fill #0

## 2015-07-11 NOTE — Patient Instructions (Signed)
Blepharitis Blepharitis is inflammation of the eyelids. Blepharitis may happen with:  Reddish, scaly skin around the scalp and eyebrows.  Burning or itching of the eyelids.  Eye discharge at night that causes the eyelashes to stick together in the morning.  Eyelashes that fall out.  Sensitivity to light. HOME CARE INSTRUCTIONS Pay attention to any changes in how you look or feel. Follow these instructions to help with your condition: Keeping Clean  Wash your hands often.  Wash your eyelids with warm water or with warm water that is mixed with a small amount of baby shampoo. Do this two times per day or as often as needed.  Wash your face and eyebrows at least once a day.  Use a clean towel each time you dry your eyelids. Do not use this towel to clean or dry other areas of your body. Do not share your towel with anyone. General Instructions  Avoid wearing makeup until you get better. Do not share makeup with anyone.  Avoid rubbing your eyes.  Apply warm compresses to your eyes 2 times per day for 10 minutes at a time, or as told by your health care provider.  If you were prescribed an antibiotic ointment or steroid drops, apply or use the medicine as told by your health care provider. Do not stop using the medicine even if you feel better.  Keep all follow-up visits as told by your health care provider. This is important. SEEK MEDICAL CARE IF:  Your eyelids feel hot.  You have blisters or a rash on your eyelids.  The condition does not go away in 2-4 days.  The inflammation gets worse. SEEK IMMEDIATE MEDICAL CARE IF:  You have pain or redness that gets worse or spreads to other parts of your face.  Your vision changes.  You have pain when looking at lights or moving objects.  You have a fever.   This information is not intended to replace advice given to you by your health care provider. Make sure you discuss any questions you have with your health care  provider.   Document Released: 04/20/2000 Document Revised: 01/12/2015 Document Reviewed: 08/16/2014 Elsevier Interactive Patient Education 2016 [employer] Subconjunctival Hemorrhage Subconjunctival hemorrhage is bleeding that happens between the white part of your eye (sclera) and the clear membrane that covers the outside of your eye (conjunctiva). There are many tiny blood vessels near the surface of your eye. A subconjunctival hemorrhage happens when one or more of these vessels breaks and bleeds, causing a red patch to appear on your eye. This is similar to a bruise. Depending on the amount of bleeding, the red patch may only cover a small area of your eye or it may cover the entire visible part of the sclera. If a lot of blood collects under the conjunctiva, there may also be swelling. Subconjunctival hemorrhages do not affect your vision or cause pain, but your eye may feel irritated if there is swelling. Subconjunctival hemorrhages usually do not require treatment, and they disappear on their own within two weeks. CAUSES This condition may be caused by:  Mild trauma, such as rubbing your eye too hard.  Severe trauma or blunt injuries.  Coughing, sneezing, or vomiting.  Straining, such as when lifting a heavy object.  High blood pressure.  Recent eye surgery.  A history of diabetes.  Certain medicines, especially blood thinners (anticoagulants).  Other conditions, such as eye tumors, bleeding disorders, or blood vessel abnormalities. Subconjunctival hemorrhages can happen without an  obvious cause.  SYMPTOMS  Symptoms of this condition include:  A bright red or dark red patch on the white part of the eye.  The red area may spread out to cover a larger area of the eye before it goes away.  The red area may turn brownish-yellow before it goes away.  Swelling.  Mild eye irritation. DIAGNOSIS This condition is diagnosed with a physical exam. If your subconjunctival  hemorrhage was caused by trauma, your health care provider may refer you to an eye specialist (ophthalmologist) or another specialist to check for other injuries. You may have other tests, including:  An eye exam.  A blood pressure check.  Blood tests to check for bleeding disorders. If your subconjunctival hemorrhage was caused by trauma, X-rays or a CT scan may be done to check for other injuries. TREATMENT Usually, no treatment is needed. Your health care provider may recommend eye drops or cold compresses to help with discomfort. HOME CARE INSTRUCTIONS  Take over-the-counter and prescription medicines only as directed by your health care provider.  Use eye drops or cold compresses to help with discomfort as directed by your health care provider.  Avoid activities, things, and environments that may irritate or injure your eye.  Keep all follow-up visits as told by your health care provider. This is important. SEEK MEDICAL CARE IF:  You have pain in your eye.  The bleeding does not go away within 3 weeks.  You keep getting new subconjunctival hemorrhages. SEEK IMMEDIATE MEDICAL CARE IF:  Your vision changes or you have difficulty seeing.  You suddenly develop severe sensitivity to light.  You develop a severe headache, persistent vomiting, confusion, or abnormal tiredness (lethargy).  Your eye seems to bulge or protrude from your eye socket.  You develop unexplained bruises on your body.  You have unexplained bleeding in another area of your body.   This information is not intended to replace advice given to you by your health care provider. Make sure you discuss any questions you have with your health care provider.   Document Released: 04/23/2005 Document Revised: 01/12/2015 Document Reviewed: 06/30/2014 Elsevier Interactive Patient Education [employer].

## 2015-07-11 NOTE — Progress Notes (Signed)
   Subjective:    Patient ID: Sheila Austin, female    DOB: 1985/10/29, 30 y.o.   MRN: [id number] By signing my name below, I, Zola Button, attest that this documentation has been prepared under the direction and in the presence of Robyn Haber, MD.  Electronically Signed: Zola Button, Medical Scribe. 07/11/2015. 12:54 PM.  HPI HPI Comments: Sheila Austin is a 30 y.o. female who presents to the [facility] complaining of gradual onset, right eye redness that started about a week ago. Patient reports having associated itchiness and burning pain to the eye. The redness has improved some since last week. Patient initially thought she had allergies, so she tried Benadryl, Visine, and antihistamines but without relief.  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  Review of Systems  Eyes: Positive for pain, redness and itching.       Objective:   Physical Exam CONSTITUTIONAL: Well developed/well nourished HEAD: Normocephalic/atraumatic EYES: EOM/PERRL. Subconjunctival hemorrhage on the right. Mild blepharitis on the right. ENMT: Mucous membranes moist NECK: supple no meningeal signs SPINE: entire spine nontender CV: S1/S2 noted, no murmurs/rubs/gallops noted LUNGS: Lungs are clear to auscultation bilaterally, no apparent distress ABDOMEN: soft, nontender, no rebound or guarding GU: no cva tenderness NEURO: Pt is awake/alert, moves all extremitiesx4 EXTREMITIES: pulses normal, full ROM SKIN: warm, color normal PSYCH: no abnormalities of mood noted        Assessment & Plan:   This chart was scribed in my presence and reviewed by me personally.    ICD-9-CM ICD-10-CM   1. Subconjunctival bleed, right 372.72 H11.31   2. Blepharitis, right 373.00 H01.003 tobramycin (TOBREX) 0.3 % ophthalmic solution     Signed, Robyn Haber, MD

## 2015-09-26 ENCOUNTER — Ambulatory Visit (INDEPENDENT_AMBULATORY_CARE_PROVIDER_SITE_OTHER): Payer: 59 | Admitting: Physician Assistant

## 2015-09-26 VITALS — BP 118/90 | HR 116 | Temp 98.3°F | Resp 18 | Ht 63.0 in | Wt 255.0 lb

## 2015-09-26 DIAGNOSIS — F329 Major depressive disorder, single episode, unspecified: Secondary | ICD-10-CM | POA: Diagnosis not present

## 2015-09-26 DIAGNOSIS — L03115 Cellulitis of right lower limb: Secondary | ICD-10-CM | POA: Diagnosis not present

## 2015-09-26 DIAGNOSIS — G4700 Insomnia, unspecified: Secondary | ICD-10-CM | POA: Diagnosis not present

## 2015-09-26 DIAGNOSIS — E1165 Type 2 diabetes mellitus with hyperglycemia: Secondary | ICD-10-CM

## 2015-09-26 DIAGNOSIS — E119 Type 2 diabetes mellitus without complications: Secondary | ICD-10-CM | POA: Diagnosis not present

## 2015-09-26 DIAGNOSIS — F32A Depression, unspecified: Secondary | ICD-10-CM

## 2015-09-26 LAB — COMPLETE METABOLIC PANEL WITH GFR
ALT: 13 U/L (ref 6–29)
AST: 12 U/L (ref 10–30)
Albumin: 4 g/dL (ref 3.6–5.1)
Alkaline Phosphatase: 66 U/L (ref 33–115)
BILIRUBIN TOTAL: 0.4 mg/dL (ref 0.2–1.2)
BUN: 10 mg/dL (ref 7–25)
CO2: 25 mmol/L (ref 20–31)
Calcium: 8.9 mg/dL (ref 8.6–10.2)
Chloride: 96 mmol/L — ABNORMAL LOW (ref 98–110)
Creat: 0.64 mg/dL (ref 0.50–1.10)
GFR, Est African American: >89 mL/min (ref >=60)
GLUCOSE: 372 mg/dL — AB (ref 65–99)
Potassium: 4 mmol/L (ref 3.5–5.3)
SODIUM: 133 mmol/L — AB (ref 135–146)
TOTAL PROTEIN: 7.8 g/dL (ref 6.1–8.1)

## 2015-09-26 LAB — POCT CBC
Granulocyte percent: 67.9 %G (ref 37–80)
HCT, POC: 41 % (ref 37.7–47.9)
Hemoglobin: 14.7 g/dL (ref 12.2–16.2)
Lymph, poc: 3.4 (ref 0.6–3.4)
MCH, POC: 30.4 pg (ref 27–31.2)
MCHC: 35.8 g/dL — AB (ref 31.8–35.4)
MCV: 85 fL (ref 80–97)
MID (CBC): 0.6 (ref 0–0.9)
MPV: 8.6 fL (ref 0–99.8)
POC Granulocyte: 8.4 — AB (ref 2–6.9)
POC LYMPH %: 27.2 % (ref 10–50)
POC MID %: 4.9 % (ref 0–12)
Platelet Count, POC: 248 10*3/uL (ref 142–424)
RBC: 4.83 M/uL (ref 4.04–5.48)
RDW, POC: 13.1 %
WBC: 12.4 10*3/uL — AB (ref 4.6–10.2)

## 2015-09-26 LAB — POCT GLYCOSYLATED HEMOGLOBIN (HGB A1C): Hemoglobin A1C: 13.3

## 2015-09-26 LAB — GLUCOSE, POCT (MANUAL RESULT ENTRY): POC Glucose: 385 mg/dl — AB (ref 70–99)

## 2015-09-26 LAB — TSH: TSH: 2.47 mIU/L

## 2015-09-26 MED ORDER — TRAZODONE HCL 50 MG PO TABS: 50.0000 mg | ORAL_TABLET | Freq: Every evening | ORAL | Status: DC | PRN

## 2015-09-26 MED ORDER — DOXYCYCLINE HYCLATE 100 MG PO CAPS: 100.0000 mg | ORAL_CAPSULE | Freq: Two times a day (BID) | ORAL | Status: AC

## 2015-09-26 MED FILL — traZODone HCL 50 MG TABS: 50 | 30 days supply | Qty: 30 | Fill #0

## 2015-09-26 MED FILL — DOXYCYCLINE HYCLATE 100 MG: 100 | 10 days supply | Qty: 20 | Fill #0

## 2015-09-26 NOTE — Progress Notes (Signed)
[facility] [address] [place] [address] [phone]  Date:  09/26/2015   Name:  Sheila Austin   DOB:  1985/08/08   MRN:  [id number]  PCP:  Elby Beck, FNP   Chief Complaint  Patient presents with  . Mass    right upper leg, x 2 days    History of Present Illness:  Sheila Austin is a 30 y.o. female patient who presents to [facility] for cc of swelling on left leg.  Patient reports a bump that appeared as a small pustule 2 days ago.  This then progressively worsened to reddened swelling and warmth in the area.  Patient denies fever or nausea.  No leg joint pain, occipital headache.  The pain that is localized around the swollen area.  It never appeared to be a target like lesion.  She has had no heavy outdoor play.  She has attempted to open the lesion, where she expressed purulent fluid.  She then would rub it with etOH pads.  Patient has uncontrolled diabetes.  This was found years ago.  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  She then was placed on basal/prandial insulin but she does have an aversion to needles.  She administered until stressors of family and cost became too much, so she never had follow up.  Patient voices stressors with family.  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  She is also recently in seperation of her husband.  Never dxd with mood disorder, she has had depressed mood, lack of energy, personal interest, little sleep, fatigue, feelings of worthlessness.  She denies si/hi.  She has no auditory hallucinations.  There is infidelity in marriage from both sides throughout.  She also has stated that she has been more depressed since the miscarriage of a baby.  She has had sexual encounters outside of marriage, and can not distinguish if these were times surrounding minimal sleep and/or significant mood changes.  There is hx of  brother with trichollotomania, psychotic disorder.  Sister with bipolar depression, bpd.  Mother with psychotic disorder, and major depression.  No hx of sexual abuse that she reports today.  Marriage was not abusive.      Patient Active Problem List   Diagnosis Date Noted  . Chronic diarrhea 12/07/2013  . Chronic bilateral lower abdominal pain 12/07/2013  . Skin lesions - right lower extremity 12/07/2013  . Morbid obesity with BMI of 50.0-59.9, adult ([place]) 10/05/2011  . Obstructive sleep apnea 10/05/2011  . Hypertriglyceridemia 09/27/2011  . Helicobacter pylori gastritis 09/07/2011  . Migraine 09/07/2011  . Essential hypertension, benign 11/01/2008  . Gastroparesis 09/27/2008  . IRREGULAR MENSES 08/24/2008  . Diabetes mellitus out of control ([place]) 07/04/2006    Past Medical History  Diagnosis Date  . Diabetes mellitus   . Ectopic pregnancy   . H. pylori infection   . GERD (gastroesophageal reflux disease)   . Morbid obesity with BMI of 50.0-59.9, adult ([place]) 10/05/2011  . Essential hypertension, benign 11/01/2008  . E. coli sepsis ([place])   . Gastroparesis   . Pyelonephritis   . Pulmonary edema   . Fatty liver   . Ureteral obstruction   . Helicobacter pylori gastritis 09/07/2011  . Migraine     Past Surgical History  Procedure Laterality Date  . Esophagogastroduodenoscopy  2010    Social History  Substance Use Topics  .  Smoking status: Never Smoker   . Smokeless tobacco: Never Used  . Alcohol Use: Yes     Comment: occasional    Family History  Problem Relation Age of Onset  . Arthritis Mother   . Asthma Mother   . COPD Mother   . Depression Mother   . Diabetes Mother   . Hypertension Mother   . Mental illness Mother   . Kidney disease Mother   . Crohn's disease Mother   . Mental illness Sister   . Arthritis Maternal Aunt   . Asthma Maternal Aunt   . COPD Maternal Aunt   . Depression Maternal Aunt   . Diabetes Maternal Aunt   . Hypertension Maternal Aunt    . Mental illness Maternal Aunt   . Stroke Maternal Aunt   . Heart failure Maternal Aunt   . Heart failure Maternal Grandfather   . Cancer Maternal Grandfather   . Diabetes Maternal Grandfather   . Heart disease Maternal Grandfather   . Hypertension Maternal Grandfather   . Hyperlipidemia Maternal Grandfather   . Aneurysm Father 33    brain  . Mental illness Brother   . Diabetes Maternal Grandmother   . Heart disease Maternal Grandmother   . Hypertension Maternal Grandmother   . Hyperlipidemia Maternal Grandmother   . Diabetes Paternal Grandmother   . Heart disease Paternal Grandmother   . Diabetes Paternal Grandfather   . Cancer Paternal Grandfather     Allergies  Allergen Reactions  . Penicillins Anaphylaxis    Medication list has been reviewed and updated.  Current Outpatient Prescriptions on File Prior to Visit  Medication Sig Dispense Refill  . tobramycin (TOBREX) 0.3 % ophthalmic solution Place 1 drop into the right eye 3 (three) times daily as needed. (Patient not taking: Reported on 09/26/2015) 5 mL 0   No current facility-administered medications on file prior to visit.    ROS   Physical Examination: BP 118/90 mmHg  Pulse 116  Temp(Src) 98.3 F (36.8 C) (Oral)  Resp 18  Ht 5\' 3"  (1.6 m)  Wt 255 lb (115.667 kg)  BMI 45.18 kg/m2  SpO2 98%  LMP 09/05/2015 (Approximate) Ideal Body Weight: Weight in (lb) to have BMI = 25: 140.8  Physical Exam  Constitutional: She is oriented to person, place, and time. She appears well-developed and well-nourished. No distress.  HENT:  Head: Normocephalic and atraumatic.  Right Ear: External ear normal.  Left Ear: External ear normal.  Eyes: Conjunctivae and EOM are normal. Pupils are equal, round, and reactive to light.  Neck: No tracheal deviation present. No thyromegaly present.  Cardiovascular: Normal rate and regular rhythm.  Exam reveals no gallop and no friction rub.   No murmur heard. Pulmonary/Chest: Effort  normal. No respiratory distress. She has no wheezes.  Abdominal: Soft. Bowel sounds are normal. She exhibits no distension. There is no tenderness.  Neurological: She is alert and oriented to person, place, and time.  Skin: She is not diaphoretic.  Right lateral thigh with erythematous swelling that is indurated.  Cellulitic peau d'orange appearance extending about 3 cm out circumferentially.  This was demarcated.  With 22 gauge, scab debrided, to reveal purulent draiange.  No lympadenopathy  Psychiatric: She has a normal mood and affect. Her behavior is normal.     Assessment and Plan: Sheila Austin is a 30 y.o. female who is here today for cc of bump on leg. -Scab T roofed to capture wound culture. I have demarcated the swollen area. I  prescribed doxycycline at this time. This appears like staph aureus unless MRSA which was a concern for her. -Due to cost, and lack of any current information, I have advised her to return in 2 days for follow-up to see if we need to open this potential abscess. At that time we will receive the results of her kidney functioning and can determine a good treatment plan.  We discussed starting xigduo where metformin will be low dose, along with [place].  Avoiding medicines that are contraindicated for thyroid carcinoma without clear hx of mother's throat cancer. -starting her on trazadone for insomnia and depression.  I will send her to the mood center for consult which is appreciated. Psychiatric consult appreciated to evaluate possible bipolar.  Therapy also sought and appreciated.  she is open to this.    -40 min direct contact given to this patient by provider.  Cellulitis of right thigh - Plan: POCT CBC, POCT glucose (manual entry), POCT glycosylated hemoglobin (Hb A1C), doxycycline (VIBRAMYCIN) 100 MG capsule, Wound culture  Uncontrolled type 2 diabetes mellitus with hyperglycemia, without long-term current use of insulin ([place]) - Plan: POCT glucose (manual  entry), POCT glycosylated hemoglobin (Hb A1C), COMPLETE METABOLIC PANEL WITH GFR  Depression - Plan: TSH, Ambulatory referral to Psychiatry  Insomnia - Plan: traZODone (DESYREL) 50 MG tablet, Ambulatory referral to Psychiatry    Ivar Drape, PA-C [facility] 09/26/2015 3:27 PM

## 2015-09-26 NOTE — Patient Instructions (Addendum)
     IF you received an x-ray today, you will receive an invoice from [facility]. Please contact [facility] at [phone] with questions or concerns regarding your invoice.   IF you received labwork today, you will receive an invoice from [employer]. Please contact [facility] at [phone] with questions or concerns regarding your invoice.   Our billing staff will not be able to assist you with questions regarding bills from these companies.  You will be contacted with the lab results as soon as they are available. The fastest way to get your results is to activate your My Chart account. Instructions are located on the last page of this paperwork. If you have not heard from [place] regarding the results in 2 weeks, please contact this office.    I would like you to start the doxycycline at this time.  I would like you to use warm compresses in this area at least four times per day. I would like you to return in 2 days.  We are going to discuss the appropriate treatment at that time.  I need to figure out your kidney function prior to starting you on anything.  Return sooner, if the abscess worsens.  i will be here tomorrow.   Cellulitis Cellulitis is an infection of the skin and the tissue beneath it. The infected area is usually red and tender. Cellulitis occurs most often in the arms and lower legs.  CAUSES  Cellulitis is caused by bacteria that enter the skin through cracks or cuts in the skin. The most common types of bacteria that cause cellulitis are staphylococci and streptococci. SIGNS AND SYMPTOMS   Redness and warmth.  Swelling.  Tenderness or pain.  Fever. DIAGNOSIS  Your health care provider can usually determine what is wrong based on a physical exam. Blood tests may also be done. TREATMENT  Treatment usually involves taking an antibiotic medicine. HOME CARE INSTRUCTIONS   Take your antibiotic medicine as directed by your  health care provider. Finish the antibiotic even if you start to feel better.  Keep the infected arm or leg elevated to reduce swelling.  Apply a warm cloth to the affected area up to 4 times per day to relieve pain.  Take medicines only as directed by your health care provider.  Keep all follow-up visits as directed by your health care provider. SEEK MEDICAL CARE IF:   You notice red streaks coming from the infected area.  Your red area gets larger or turns dark in color.  Your bone or joint underneath the infected area becomes painful after the skin has healed.  Your infection returns in the same area or another area.  You notice a swollen bump in the infected area.  You develop new symptoms.  You have a fever. SEEK IMMEDIATE MEDICAL CARE IF:   You feel very sleepy.  You develop vomiting or diarrhea.  You have a general ill feeling (malaise) with muscle aches and pains.   This information is not intended to replace advice given to you by your health care provider. Make sure you discuss any questions you have with your health care provider.   Document Released: 01/31/2005 Document Revised: 01/12/2015 Document Reviewed: 07/09/2011 Elsevier Interactive Patient Education [employer].

## 2015-09-29 ENCOUNTER — Ambulatory Visit (INDEPENDENT_AMBULATORY_CARE_PROVIDER_SITE_OTHER): Payer: 59 | Admitting: Physician Assistant

## 2015-09-29 VITALS — BP 116/74 | HR 78 | Temp 97.9°F | Resp 18 | Ht 63.0 in | Wt 259.0 lb

## 2015-09-29 DIAGNOSIS — E119 Type 2 diabetes mellitus without complications: Secondary | ICD-10-CM | POA: Diagnosis not present

## 2015-09-29 DIAGNOSIS — L03115 Cellulitis of right lower limb: Secondary | ICD-10-CM | POA: Diagnosis not present

## 2015-09-29 DIAGNOSIS — E1165 Type 2 diabetes mellitus with hyperglycemia: Secondary | ICD-10-CM | POA: Diagnosis not present

## 2015-09-29 DIAGNOSIS — Z6841 Body Mass Index (BMI) 40.0 and over, adult: Secondary | ICD-10-CM | POA: Insufficient documentation

## 2015-09-29 DIAGNOSIS — Z30019 Encounter for initial prescription of contraceptives, unspecified: Secondary | ICD-10-CM

## 2015-09-29 MED ORDER — DAPAGLIFLOZIN PROPANEDIOL 5 MG PO TABS: 5.0000 mg | ORAL_TABLET | Freq: Every day | ORAL | Status: DC

## 2015-09-29 MED ORDER — NORGESTIMATE-ETH ESTRADIOL 0.25-35 MG-MCG PO TABS: 1.0000 | ORAL_TABLET | Freq: Every day | ORAL | Status: DC

## 2015-09-29 MED FILL — FARXIGA 5 MG TABLET: 5 | 30 days supply | Qty: 30 | Fill #0

## 2015-09-29 MED FILL — MONO-LINYAH 28 TABLET: 0.25-35 | 28 days supply | Qty: 28 | Fill #0

## 2015-09-29 NOTE — Progress Notes (Signed)
Patient ID: Sheila Austin, female    DOB: 02-09-1986, 30 y.o.   MRN: [id number]  PCP: Elby Beck, FNP  Subjective:   Chief Complaint  Patient presents with  . Follow-up    leg wound from Monday    HPI Presents for evaluation of a wound of the RIGHT thigh initially evaluated on 5/22. She is accompanied by her mother, who naps in the room during our visit.  She had noticed a small pustule 2 days prior, that had become red, swollen and increased in warmth.The erythematous area of the RIGHT lateral thigh was marked with a surgical marker. The central eschar was unroofed and purulent drainage spontaneously drained. She was started on doxycycline and advised to RTC in 2 days for re-evaluation.  In addition, labs were drawn to assess her uncontrolled diabetes, she was started on Trazodone for insomnia and depression and referred to the mood center for further evaluation, as there is concern for bipolar disorder.  Today she notes that she is feeling better, but that the redness has extended beyond the marker. Pain is less. Minimal drainage on the dressings she changes. Tolerating doxycycline without adverse effects. No fever, chills. No nausea/vomiting.  Wound culture reveals MRSA.    Review of Systems As above.    Patient Active Problem List   Diagnosis Date Noted  . BMI 45.0-49.9, adult ([place]) 09/29/2015  . Skin lesions - right lower extremity 12/07/2013  . Morbid obesity with BMI of 50.0-59.9, adult ([place]) 10/05/2011  . Obstructive sleep apnea 10/05/2011  . Hypertriglyceridemia 09/27/2011  . Migraine 09/07/2011  . Essential hypertension, benign 11/01/2008  . Gastroparesis 09/27/2008  . IRREGULAR MENSES 08/24/2008  . Diabetes mellitus out of control ([place]) 07/04/2006     Prior to Admission medications   Medication Sig Start Date End Date Taking? Authorizing Provider  doxycycline (VIBRAMYCIN) 100 MG capsule Take 1 capsule (100 mg total) by mouth 2 (two) times daily.  09/26/15 10/06/15 Yes Stephanie D English, PA  traZODone (DESYREL) 50 MG tablet Take 1 tablet (50 mg total) by mouth at bedtime as needed for sleep. 09/26/15  Yes Dorian Heckle English, PA  tobramycin (TOBREX) 0.3 % ophthalmic solution Place 1 drop into the right eye 3 (three) times daily as needed. Patient not taking: Reported on 09/26/2015 07/11/15   Robyn Haber, MD     Allergies  Allergen Reactions  . Penicillins Anaphylaxis       Objective:  Physical Exam  Constitutional: She is oriented to person, place, and time. She appears well-developed and well-nourished. She is active and cooperative. No distress.  BP 116/74 mmHg  Pulse 78  Temp(Src) 97.9 F (36.6 C) (Oral)  Resp 18  Ht 5\' 3"  (1.6 m)  Wt 259 lb (117.482 kg)  BMI 45.89 kg/m2  SpO2 100%  LMP 09/05/2015 (Approximate)   Eyes: Conjunctivae are normal.  Pulmonary/Chest: Effort normal.  Neurological: She is alert and oriented to person, place, and time.  Skin:     Psychiatric: She has a normal mood and affect. Her speech is normal and behavior is normal.    With permission, the area was anesthetized with 2 cc 1% lidocaine plain. 3 mm punch used to excise the central opening. Moderate purulence and a cyst sac removed. Bandaid applied.       Assessment & Plan:   1. Cellulitis of right thigh Continue doxycycline, warm compresses and dressing changes. Anticipate rapid improvement over the next 48 hours.  2. Uncontrolled type 2 diabetes mellitus with  hyperglycemia, without long-term current use of insulin ([place]) Start [place]. Follow-up with Ms. English in 3 months. - Microalbumin, urine - dapagliflozin propanediol (FARXIGA) 5 MG TABS tablet; Take 5 mg by mouth daily.  Dispense: 30 tablet; Refill: 3  3. Encounter for initial prescription of contraceptives She desires contraception. Discussed options. COC is her current choice. - norgestimate-ethinyl estradiol (ORTHO-CYCLEN,SPRINTEC,PREVIFEM) 0.25-35 MG-MCG tablet; Take 1  tablet by mouth daily.  Dispense: 1 Package; Refill: 12   Fara Chute, PA-C Physician Assistant-Certified [facility]

## 2015-09-29 NOTE — Patient Instructions (Addendum)
Please schedule an appointment with Dr. Roxan Hockey.  Continue the antibiotic as prescribed. Apply a warm compress to the area for 15-20 minutes 2-4 times each day. Change the dressing daily, more often if it becomes saturated, leaks or falls off.  We will contact you regarding the Nexplanon once we find out what your insurance covers. In the meantime, you may start the oral contraceptive.      IF you received an x-ray today, you will receive an invoice from [facility]. Please contact [facility] at [phone] with questions or concerns regarding your invoice.   IF you received labwork today, you will receive an invoice from [employer]. Please contact [facility] at [phone] with questions or concerns regarding your invoice.   Our billing staff will not be able to assist you with questions regarding bills from these companies.  You will be contacted with the lab results as soon as they are available. The fastest way to get your results is to activate your My Chart account. Instructions are located on the last page of this paperwork. If you have not heard from [place] regarding the results in 2 weeks, please contact this office.

## 2015-09-30 LAB — WOUND CULTURE: Gram Stain: NONE SEEN

## 2015-09-30 LAB — MICROALBUMIN, URINE: MICROALB UR: 1.6 mg/dL

## 2016-01-02 ENCOUNTER — Ambulatory Visit (INDEPENDENT_AMBULATORY_CARE_PROVIDER_SITE_OTHER): Payer: 59 | Admitting: Physician Assistant

## 2016-01-02 ENCOUNTER — Encounter: Payer: Self-pay | Admitting: Physician Assistant

## 2016-01-02 ENCOUNTER — Other Ambulatory Visit: Payer: Self-pay | Admitting: Physician Assistant

## 2016-01-02 VITALS — BP 116/78 | HR 140 | Temp 97.0°F | Resp 16 | Ht 63.0 in | Wt 260.0 lb

## 2016-01-02 DIAGNOSIS — N63 Unspecified lump in unspecified breast: Secondary | ICD-10-CM

## 2016-01-02 DIAGNOSIS — N644 Mastodynia: Secondary | ICD-10-CM

## 2016-01-02 LAB — POCT CBC
Granulocyte percent: 72.5 %G (ref 37–80)
HCT, POC: 40.7 % (ref 37.7–47.9)
HEMOGLOBIN: 14.9 g/dL (ref 12.2–16.2)
Lymph, poc: 3.3 (ref 0.6–3.4)
MCH: 30.6 pg (ref 27–31.2)
MCHC: 36.8 g/dL — AB (ref 31.8–35.4)
MCV: 83.2 fL (ref 80–97)
MID (cbc): 0.8 (ref 0–0.9)
MPV: 8.9 fL (ref 0–99.8)
PLATELET COUNT, POC: 273 10*3/uL (ref 142–424)
POC Granulocyte: 10.7 — AB (ref 2–6.9)
POC LYMPH PERCENT: 22.3 %L (ref 10–50)
POC MID %: 5.2 %M (ref 0–12)
RBC: 4.89 M/uL (ref 4.04–5.48)
RDW, POC: 12.8 %
WBC: 14.8 10*3/uL — AB (ref 4.6–10.2)

## 2016-01-02 LAB — GLUCOSE, POCT (MANUAL RESULT ENTRY)

## 2016-01-02 LAB — POCT GLYCOSYLATED HEMOGLOBIN (HGB A1C): HEMOGLOBIN A1C: 13.5

## 2016-01-02 MED ORDER — SULFAMETHOXAZOLE-TRIMETHOPRIM 800-160 MG PO TABS: 1.0000 | ORAL_TABLET | Freq: Two times a day (BID) | ORAL | 0 refills | Status: DC

## 2016-01-02 MED FILL — SULFAMETHOXAZOLE/TMP DS TAB: 800-160 | 10 days supply | Qty: 20 | Fill #0

## 2016-01-02 NOTE — Progress Notes (Addendum)
Patient ID: [name], female   DOB: 12-30-1985, 30 y.o.   MRN: [id number] [facility] [address], [place] [address] [phone]  Date:  01/02/2016   Name:  Sheila Austin   DOB:  01-25-1986   MRN:  [id number]  PCP:  Ivar Drape, PA  By signing my name below, I, Ladene Artist, attest that this documentation has been prepared under the direction and in the presence of Ivar Drape, PA-C Electronically Signed: Ladene Artist, ED Scribe 01/02/2016 at 2:10 PM.  History of Present Illness:  Sheila Austin is a 30 y.o. female patient who presents to [facility] complaining of a gradually worsening painful mass to the right breast first noticed 1 week ago. Pt states that she rolled over in bed one night when she initially noticed the mass. She describes pain as a constant, burning and stinging pain that is exacerbated with palpation. She has also noticed increased warmth to the area. Pt's LNMP was 2 weeks ago. She does not have any children. Pt denies possibility of pregnancy; states she had a negative home pregnancy test.   Patient Active Problem List   Diagnosis Date Noted   BMI 45.0-49.9, adult ([place]) 09/29/2015   Skin lesions - right lower extremity 12/07/2013   Morbid obesity with BMI of 50.0-59.9, adult ([place]) 10/05/2011   Obstructive sleep apnea 10/05/2011   Hypertriglyceridemia 09/27/2011   Migraine 09/07/2011   Essential hypertension, benign 11/01/2008   Gastroparesis 09/27/2008   IRREGULAR MENSES 08/24/2008   Diabetes mellitus out of control ([place]) 07/04/2006    Past Medical History:  Diagnosis Date   Diabetes mellitus    E. coli sepsis ([place])    Ectopic pregnancy    Essential hypertension, benign 11/01/2008   Fatty liver    Gastroparesis    GERD (gastroesophageal reflux disease)    H. pylori infection    Helicobacter pylori gastritis 09/07/2011   Migraine    Morbid obesity with BMI of 50.0-59.9, adult ([place]) 10/05/2011    Pulmonary edema    Pyelonephritis    Ureteral obstruction     Past Surgical History:  Procedure Laterality Date   ESOPHAGOGASTRODUODENOSCOPY  2010    Social History  Substance Use Topics   Smoking status: Never Smoker   Smokeless tobacco: Never Used   Alcohol use Yes     Comment: occasional    Family History  Problem Relation Age of Onset   Arthritis Mother    Asthma Mother    COPD Mother    Depression Mother    Diabetes Mother    Hypertension Mother    Mental illness Mother    Kidney disease Mother    Crohn's disease Mother    Mental illness Sister    Arthritis Maternal Aunt    Asthma Maternal Aunt    COPD Maternal Aunt    Depression Maternal Aunt    Diabetes Maternal Aunt    Hypertension Maternal Aunt    Mental illness Maternal Aunt    Stroke Maternal Aunt    Heart failure Maternal Aunt    Heart failure Maternal Grandfather    Cancer Maternal Grandfather    Diabetes Maternal Grandfather    Heart disease Maternal Grandfather    Hypertension Maternal Grandfather    Hyperlipidemia Maternal Grandfather    Aneurysm Father 56    brain   Mental illness Brother    Diabetes Maternal Grandmother    Heart disease Maternal Grandmother    Hypertension Maternal Grandmother  Hyperlipidemia Maternal Grandmother    Diabetes Paternal Grandmother    Heart disease Paternal Grandmother    Diabetes Paternal Grandfather    Cancer Paternal Grandfather     Allergies  Allergen Reactions   Penicillins Anaphylaxis    Medication list has been reviewed and updated.  Current Outpatient Prescriptions on File Prior to Visit  Medication Sig Dispense Refill   dapagliflozin propanediol (FARXIGA) 5 MG TABS tablet Take 5 mg by mouth daily. (Patient not taking: Reported on 01/02/2016) 30 tablet 3   norgestimate-ethinyl estradiol (ORTHO-CYCLEN,SPRINTEC,PREVIFEM) 0.25-35 MG-MCG tablet Take 1 tablet by mouth daily. (Patient not taking:  Reported on 01/02/2016) 1 Package 12   traZODone (DESYREL) 50 MG tablet Take 1 tablet (50 mg total) by mouth at bedtime as needed for sleep. (Patient not taking: Reported on 01/02/2016) 30 tablet 0   No current facility-administered medications on file prior to visit.     Review of Systems  Musculoskeletal:       + R breast mass  ROS otherwise unremarkable unless listed above.   Physical Examination: There were no vitals taken for this visit. Ideal Body Weight: @FLOWAMB [id number]  Physical Exam  Constitutional: She is oriented to person, place, and time. She appears well-developed and well-nourished. No distress.  HENT:  Head: Normocephalic and atraumatic.  Right Ear: External ear normal.  Left Ear: External ear normal.  Eyes: Conjunctivae and EOM are normal. Pupils are equal, round, and reactive to light.  Cardiovascular: Normal rate.   Pulmonary/Chest: Effort normal. No respiratory distress.  The R breast has very mild erythema along the border of the areolar at the 7 o'clock position. There is warmth to the breast in comparison to the L. She has a very mobile, rather large tender nodule underneath the breast. No nipple discharge expressed. No peau d'orange.   Lymphadenopathy:       Right axillary: No pectoral and no lateral adenopathy present.  Neurological: She is alert and oriented to person, place, and time.  Skin: She is not diaphoretic.  Psychiatric: She has a normal mood and affect. Her behavior is normal.   Assessment and Plan: NATONIA TUCK is a 30 y.o. female who is here today for cc of breast pain. We will have a mammogram today Advised to restart the farxiga.   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   Breast tenderness in female - Plan: POCT CBC, POCT glucose (manual entry), POCT glycosylated hemoglobin (Hb A1C), MM Digital Diagnostic Unilat R, sulfamethoxazole-trimethoprim (BACTRIM DS,SEPTRA DS)  800-160 MG tablet  Breast pain - Plan: sulfamethoxazole-trimethoprim (BACTRIM DS,SEPTRA DS) 800-160 MG tablet  Breast swelling - Plan: POCT CBC, POCT glucose (manual entry), POCT glycosylated hemoglobin (Hb A1C), MM Digital Diagnostic Unilat R  Ivar Drape, PA-C [facility] 01/02/2016 1:52 PM

## 2016-01-02 NOTE — Patient Instructions (Addendum)
Have sent a prescription of Bactrim to be obtained after the ultrasound. They will contact me following the ultrasound very likely, but I would like you to start an antibiotic if they don't. We will discuss plan pending this ultrasound.  Ultrasound is scheduled for tomorrow at 2:40 with a checkin time of 2:20 at the [facility] [place].    IF you received an x-ray today, you will receive an invoice from [facility]. Please contact [facility] at [phone] with questions or concerns regarding your invoice.   IF you received labwork today, you will receive an invoice from [employer]. Please contact [facility] at [phone] with questions or concerns regarding your invoice.   Our billing staff will not be able to assist you with questions regarding bills from these companies.  You will be contacted with the lab results as soon as they are available. The fastest way to get your results is to activate your My Chart account. Instructions are located on the last page of this paperwork. If you have not heard from [place] regarding the results in 2 weeks, please contact this office.

## 2016-01-03 ENCOUNTER — Other Ambulatory Visit: Payer: Self-pay | Admitting: Physician Assistant

## 2016-01-03 ENCOUNTER — Ambulatory Visit
Admission: RE | Admit: 2016-01-03 | Discharge: 2016-01-03 | Disposition: A | Discharge status: Home / Self Care | Payer: 59 | Source: Ambulatory Visit | Attending: Physician Assistant | Admitting: Physician Assistant

## 2016-01-03 DIAGNOSIS — N63 Unspecified lump in unspecified breast: Secondary | ICD-10-CM

## 2016-01-03 DIAGNOSIS — N644 Mastodynia: Secondary | ICD-10-CM | POA: Diagnosis not present

## 2016-01-03 MED FILL — FARXIGA 5 MG TABLET: 5 | 30 days supply | Qty: 30 | Fill #1

## 2016-01-03 MED FILL — MONO-LINYAH 28 TABLET: 0.25-35 | 28 days supply | Qty: 28 | Fill #1

## 2016-01-05 ENCOUNTER — Other Ambulatory Visit: Payer: Self-pay | Admitting: Family Medicine

## 2016-01-05 ENCOUNTER — Encounter: Payer: Self-pay | Admitting: Physician Assistant

## 2016-01-05 ENCOUNTER — Telehealth: Payer: Self-pay

## 2016-01-05 DIAGNOSIS — N644 Mastodynia: Secondary | ICD-10-CM

## 2016-01-05 DIAGNOSIS — N63 Unspecified lump in unspecified breast: Secondary | ICD-10-CM

## 2016-01-05 NOTE — Telephone Encounter (Addendum)
THIS MESSAGE IS FROM Sheila Austin FROM THE [facility].  THIS PATIENT HAS AN ULTRASOUND OF HER (R) BREAST SCHEDULED FOR Friday SEPT. 1, 2017 AT 2:00. SARAH WEBER NEEDS TO GO INTO EPIC TO SIGN THE ORDER. SHE WILL ALSO FAX THE ORDER HERE, BUT IT WILL NEED TO BE SIGNED AND FAXED BACK NO LATER THAN 3:00 TODAY. EITHER WAY IS FINE. BEST PHONE [phone] (ASK FOR FELIA) IF SHE IS NOT AVAILABLE PLEASE CALL [place] AT [phone] [phone]. [phone].  [place]

## 2016-01-06 ENCOUNTER — Other Ambulatory Visit: Payer: 59

## 2016-01-23 ENCOUNTER — Ambulatory Visit
Admission: RE | Admit: 2016-01-23 | Discharge: 2016-01-23 | Disposition: A | Discharge status: Home / Self Care | Payer: 59 | Source: Ambulatory Visit | Attending: Family Medicine | Admitting: Family Medicine

## 2016-01-23 ENCOUNTER — Other Ambulatory Visit: Payer: Self-pay | Admitting: Family Medicine

## 2016-01-23 ENCOUNTER — Telehealth: Payer: Self-pay

## 2016-01-23 DIAGNOSIS — N611 Abscess of the breast and nipple: Secondary | ICD-10-CM

## 2016-01-23 DIAGNOSIS — N644 Mastodynia: Secondary | ICD-10-CM

## 2016-01-23 DIAGNOSIS — N63 Unspecified lump in unspecified breast: Secondary | ICD-10-CM

## 2016-01-23 DIAGNOSIS — N6489 Other specified disorders of breast: Secondary | ICD-10-CM | POA: Diagnosis not present

## 2016-01-23 MED ORDER — DOXYCYCLINE HYCLATE 100 MG PO CAPS: 100.0000 mg | ORAL_CAPSULE | Freq: Two times a day (BID) | ORAL | 0 refills | Status: DC

## 2016-01-23 MED FILL — DOXYCYCLINE HYC 100 MG CAP: 100 | 10 days supply | Qty: 20 | Fill #0

## 2016-01-23 NOTE — Telephone Encounter (Signed)
Signed order for follow-up US.  Meds ordered this encounter  Medications  . doxycycline (VIBRAMYCIN) 100 MG capsule    Sig: Take 1 capsule (100 mg total) by mouth 2 (two) times daily.    Dispense:  20 capsule    Refill:  0    Order Specific Question:   Supervising Provider    Answer:   Brigitte Pulse, EVA N [[id number]]

## 2016-01-23 NOTE — Telephone Encounter (Signed)
Sheila Austin from the [facility] called to report that when they just repeated Sheila Austin and the abscess was much worse than when it was first done before pt was put on 10 day course of Bactrim on 8/29. They were able to drain it today (were not able to orig) and drained 100 ml of pus out of it. She stated that the pt is at high risk for MRSA and wondered if we could get another Abx sent in for her (pt is allergic to [facility]) that would cover for MRSA. Clindamycin, or maybe Doxy? Junie Panning also asked that we put in another order for a repeat Sheila to re-check on Friday. Pt will check w/her pharm later today for Abx and we can call her if there is a problem or question. Pended Sheila order.

## 2016-01-23 NOTE — Telephone Encounter (Signed)
We only were to call pt if there was a problem getting either of these done, so no call needed.

## 2016-01-27 ENCOUNTER — Other Ambulatory Visit: Payer: 59

## 2016-01-28 ENCOUNTER — Telehealth: Payer: Self-pay | Admitting: Radiology

## 2016-01-28 ENCOUNTER — Ambulatory Visit (INDEPENDENT_AMBULATORY_CARE_PROVIDER_SITE_OTHER): Payer: 59 | Admitting: Family Medicine

## 2016-01-28 ENCOUNTER — Encounter (HOSPITAL_COMMUNITY): Payer: Self-pay | Admitting: Emergency Medicine

## 2016-01-28 ENCOUNTER — Emergency Department (HOSPITAL_COMMUNITY)
Admission: EM | Admit: 2016-01-28 | Discharge: 2016-01-28 | Disposition: A | Discharge status: Home / Self Care | Payer: 59 | Attending: Emergency Medicine | Admitting: Emergency Medicine

## 2016-01-28 VITALS — BP 122/72 | HR 123 | Temp 98.9°F | Resp 17 | Ht 63.5 in | Wt 253.0 lb

## 2016-01-28 DIAGNOSIS — N611 Abscess of the breast and nipple: Secondary | ICD-10-CM | POA: Insufficient documentation

## 2016-01-28 DIAGNOSIS — E1165 Type 2 diabetes mellitus with hyperglycemia: Secondary | ICD-10-CM

## 2016-01-28 DIAGNOSIS — R739 Hyperglycemia, unspecified: Secondary | ICD-10-CM

## 2016-01-28 DIAGNOSIS — Z79899 Other long term (current) drug therapy: Secondary | ICD-10-CM | POA: Diagnosis not present

## 2016-01-28 DIAGNOSIS — I10 Essential (primary) hypertension: Secondary | ICD-10-CM | POA: Insufficient documentation

## 2016-01-28 DIAGNOSIS — E119 Type 2 diabetes mellitus without complications: Secondary | ICD-10-CM | POA: Diagnosis not present

## 2016-01-28 LAB — BASIC METABOLIC PANEL
Anion gap: 8 (ref 5–15)
BUN: 11 mg/dL (ref 6–20)
CO2: 26 mmol/L (ref 22–32)
Calcium: 8.7 mg/dL — ABNORMAL LOW (ref 8.9–10.3)
Chloride: 100 mmol/L — ABNORMAL LOW (ref 101–111)
Creatinine, Ser: 0.7 mg/dL (ref 0.44–1.00)
GFR calc Af Amer: >60 mL/min (ref >60)
GFR calc non Af Amer: >60 mL/min (ref >60)
Glucose, Bld: 327 mg/dL — ABNORMAL HIGH (ref 65–99)
Potassium: 3.7 mmol/L (ref 3.5–5.1)
Sodium: 134 mmol/L — ABNORMAL LOW (ref 135–145)

## 2016-01-28 LAB — CBC WITH DIFFERENTIAL/PLATELET
Basophils Absolute: 0 10*3/uL (ref 0.0–0.1)
Basophils Relative: 0 %
Eosinophils Absolute: 0.2 10*3/uL (ref 0.0–0.7)
Eosinophils Relative: 1 %
HCT: 35.3 % — ABNORMAL LOW (ref 36.0–46.0)
Hemoglobin: 12.2 g/dL (ref 12.0–15.0)
Lymphocytes Relative: 14 %
Lymphs Abs: 2.3 10*3/uL (ref 0.7–4.0)
MCH: 29.3 pg (ref 26.0–34.0)
MCHC: 34.6 g/dL (ref 30.0–36.0)
MCV: 84.7 fL (ref 78.0–100.0)
Monocytes Absolute: 1.4 10*3/uL — ABNORMAL HIGH (ref 0.1–1.0)
Monocytes Relative: 9 %
Neutro Abs: 12.4 10*3/uL — ABNORMAL HIGH (ref 1.7–7.7)
Neutrophils Relative %: 76 %
Platelets: 308 10*3/uL (ref 150–400)
RBC: 4.17 MIL/uL (ref 3.87–5.11)
RDW: 12.8 % (ref 11.5–15.5)
WBC: 16.4 10*3/uL — ABNORMAL HIGH (ref 4.0–10.5)

## 2016-01-28 LAB — AEROBIC/ANAEROBIC CULTURE (SURGICAL/DEEP WOUND)

## 2016-01-28 LAB — AEROBIC/ANAEROBIC CULTURE W GRAM STAIN (SURGICAL/DEEP WOUND)

## 2016-01-28 MED ORDER — SULFAMETHOXAZOLE-TRIMETHOPRIM 800-160 MG PO TABS
1.0000 | ORAL_TABLET | Freq: Once | ORAL | Status: AC
  Administered 2016-01-28: 1 via ORAL
  Filled 2016-01-28: qty 1

## 2016-01-28 MED ORDER — PROMETHAZINE HCL 25 MG/ML IJ SOLN
25.0000 mg | Freq: Once | INTRAMUSCULAR | Status: AC
  Administered 2016-01-28: 25 mg via INTRAMUSCULAR
  Filled 2016-01-28: qty 1

## 2016-01-28 MED ORDER — ONDANSETRON 4 MG PO TBDP: 4.0000 mg | ORAL_TABLET | Freq: Once | ORAL | Status: DC

## 2016-01-28 MED ORDER — SODIUM CHLORIDE 0.9 % IV BOLUS (SEPSIS): 1000.0000 mL | Freq: Once | INTRAVENOUS | Status: DC

## 2016-01-28 MED ORDER — ONDANSETRON HCL 4 MG PO TABS: 4.0000 mg | ORAL_TABLET | Freq: Four times a day (QID) | ORAL | 0 refills | Status: DC

## 2016-01-28 MED ORDER — KETAMINE HCL 10 MG/ML IJ SOLN
1.0000 mg/kg | Freq: Once | INTRAMUSCULAR | Status: AC
  Administered 2016-01-28: 115 mg via INTRAVENOUS
  Filled 2016-01-28: qty 1

## 2016-01-28 MED ORDER — OXYCODONE-ACETAMINOPHEN 5-325 MG PO TABS: 1.0000 | ORAL_TABLET | ORAL | 0 refills | Status: DC | PRN

## 2016-01-28 MED ORDER — SULFAMETHOXAZOLE-TRIMETHOPRIM 800-160 MG PO TABS: 1.0000 | ORAL_TABLET | Freq: Two times a day (BID) | ORAL | 0 refills | Status: AC

## 2016-01-28 MED ORDER — ONDANSETRON 4 MG PO TBDP
4.0000 mg | ORAL_TABLET | Freq: Once | ORAL | Status: AC
  Administered 2016-01-28: 4 mg via ORAL
  Filled 2016-01-28: qty 1

## 2016-01-28 MED ORDER — KETOROLAC TROMETHAMINE 15 MG/ML IJ SOLN
15.0000 mg | Freq: Once | INTRAMUSCULAR | Status: AC
  Administered 2016-01-28: 15 mg via INTRAVENOUS
  Filled 2016-01-28: qty 1

## 2016-01-28 MED ORDER — MORPHINE SULFATE (PF) 4 MG/ML IV SOLN
6.0000 mg | Freq: Once | INTRAVENOUS | Status: AC
  Administered 2016-01-28: 6 mg via INTRAVENOUS
  Filled 2016-01-28: qty 2

## 2016-01-28 MED ORDER — INSULIN ASPART 100 UNIT/ML ~~LOC~~ SOLN
10.0000 [IU] | Freq: Once | SUBCUTANEOUS | Status: AC
  Administered 2016-01-28: 10 [IU] via INTRAVENOUS
  Filled 2016-01-28: qty 1

## 2016-01-28 MED ORDER — LIDOCAINE HCL (PF) 1 % IJ SOLN
10.0000 mL | Freq: Once | INTRAMUSCULAR | Status: AC
  Administered 2016-01-28: 10 mL
  Filled 2016-01-28: qty 30

## 2016-01-28 MED ORDER — HYDROCODONE-ACETAMINOPHEN 5-325 MG PO TABS: 1.0000 | ORAL_TABLET | ORAL | 0 refills | Status: DC | PRN

## 2016-01-28 MED ORDER — SODIUM CHLORIDE 0.9 % IV SOLN
INTRAVENOUS | Status: DC
  Administered 2016-01-28: 13:00:00 via INTRAVENOUS

## 2016-01-28 MED ORDER — SULFAMETHOXAZOLE-TRIMETHOPRIM 800-160 MG PO TABS: 1.0000 | ORAL_TABLET | Freq: Two times a day (BID) | ORAL | 1 refills | Status: DC

## 2016-01-28 MED ORDER — KETAMINE HCL-SODIUM CHLORIDE 100-0.9 MG/10ML-% IV SOSY: 1.0000 mg/kg | PREFILLED_SYRINGE | Freq: Once | INTRAVENOUS | Status: DC

## 2016-01-28 MED ORDER — CLINDAMYCIN PHOSPHATE 600 MG/50ML IV SOLN
600.0000 mg | Freq: Once | INTRAVENOUS | Status: AC
  Administered 2016-01-28: 600 mg via INTRAVENOUS
  Filled 2016-01-28: qty 50

## 2016-01-28 NOTE — Progress Notes (Signed)
This is 30 year old woman who presents with an abscess on her right breast.  She originally was seen at the office) on Septra. She finished that antibiotic and the abscess was enlarging. She went for an ultrasound which showed a large abscess and this was drained percutaneously resulting in 96 mL of pus. She was then switched doxycycline. This was 5 days ago and the right breast has markedly enlarged, become more hot and inflamed and more tender.  Patient is a known diabetic and her blood sugars been poorly controlled with a last hemoglobin A1c of 13. She was recently put on farxiga and notes increased urination from this.  Objective:BP 122/72 (BP Location: Right Arm, Patient Position: Sitting, Cuff Size: Large)   Pulse (!) 123   Temp 98.9 F (37.2 C) (Oral)   Resp 17   Ht 5' 3.5" (1.613 m)   Wt 253 lb (114.8 kg)   LMP 12/19/2015 (Approximate)   SpO2 98%   BMI 44.11 kg/m  Patient's right breast is markedly enlarged with diffuse erythema, induration and deep fluctuance. She has a healed percutaneous puncture site lateral to the areola.  Assessment: Patient has a deep abscess that is enlarging and needs surgical consultation.  Plan: I've instructed the patient to go directly to [facility] emergency room and have a surgeon consider open drainage.  Breast abscess  Uncontrolled type 2 diabetes mellitus with hyperglycemia, without long-term current use of insulin (HCC)    Signed, Carola Frost.D.

## 2016-01-28 NOTE — ED Triage Notes (Signed)
Pt c/o abscess in R breast x 1 month. Pt was started on Bactrim and at the conclusion of her antibiotics she went to breast center and had it aspirated last Monday. Since then pt has been placed on new antibiotic but pain and abscess continue to get worse and worse. Pt saw PCP today and was told to come straight here. Pt denies fever. C/o nausea, denies vomiting.

## 2016-01-28 NOTE — Op Note (Signed)
Operative Note  Sheila Austin female 30 y.o. 01/28/2016  PREOPERATIVE DX:  Right breast abscess  POSTOPERATIVE DX:  Same  PROCEDURE:   Incision and drainage right breast abscess         Surgeon: Odis Hollingshead   Anesthesia: Local (Xylocaine) with conscious sedation (Dr. Wilson Singer)  Indications:   This is a 30 year old female who's had a persistent right breast abscess for approximately 3-4 weeks. She's been on doxycycline but had some cultures done earlier in the week demonstrated Staphylococcus aureus that was resistant to tetracycline. She came to the emergency room today because of increased swelling and pain.    Procedure Detail:  She was given conscious sedation. The latter area of the right breast around the nipple was sterilely prepped and then anesthetized with 1% Xylocaine. Aspiration was performed using a 22-gauge needle and purulent fluid returned. I then made a 2 cm incision in the lateral areolar area. Using the Yankauer sucker, I evacuated approximately 120 cc of purulent fluid. Following this, a quarter-inch Penrose drain was then placed into the wound. The abscess cavity extended to the medial right breast. The drain was then kept in place using a safety pin which was anchored to the skin with 4-0 nylon suture. A bulky dressing was applied.  She tolerated the procedure well without any apparent complications. A breast binder will be placed on her.

## 2016-01-28 NOTE — Consult Note (Signed)
Reason for Consult: Right Breast Abscess Referring Physician: Dr. Larinda Buttery is an 30 y.o. female.  HPI:  She presented to the emergency department today because of increasing pain from a known right breast abscess. The history of her present illness started approximately 3-4 weeks ago when she noted a small bump in the right breast done on o'clock position of the nipple areolar complex.  She was seen by her primary care physician. This is felt to be a breast abscess and she was started on Bactrim. She was referred to the breast imaging center and underwent an ultrasound of the breast 01/03/2016  a developing abscess in the right breast but no obvious area to drain.  She returned on 01/23/2016 at which time 95 cc of pus was drained from the abscess through a lateral approach. She was switched to doxycycline. Final culture came back staph aureus resistant to tetracycline. She was scheduled to come back to the breast center 3-4 days after the initial aspiration but had a conflict with work. She subsequently was scheduled to go back to the breast center 2 days from now but because of the increasing pain and swelling, she presented to the emergency department. She's been having difficulty with her blood sugar control and her initial CBG was greater than 444.  She does not smoke.  Past Medical History:  Diagnosis Date  . Diabetes mellitus   . E. coli sepsis ([place])   . Ectopic pregnancy   . Essential hypertension, benign 11/01/2008  . Fatty liver   . Gastroparesis   . GERD (gastroesophageal reflux disease)   . H. pylori infection   . Helicobacter pylori gastritis 09/07/2011  . Migraine   . Morbid obesity with BMI of 50.0-59.9, adult ([place]) 10/05/2011  . Pulmonary edema   . Pyelonephritis   . Ureteral obstruction     Past Surgical History:  Procedure Laterality Date  . ESOPHAGOGASTRODUODENOSCOPY  2010    Family History  Problem Relation Age of Onset  . Arthritis Mother   . Asthma  Mother   . COPD Mother   . Depression Mother   . Diabetes Mother   . Hypertension Mother   . Mental illness Mother   . Kidney disease Mother   . Crohn's disease Mother   . Aneurysm Father 26    brain  . Mental illness Sister   . Arthritis Maternal Aunt   . Asthma Maternal Aunt   . COPD Maternal Aunt   . Depression Maternal Aunt   . Diabetes Maternal Aunt   . Hypertension Maternal Aunt   . Mental illness Maternal Aunt   . Stroke Maternal Aunt   . Heart failure Maternal Aunt   . Heart failure Maternal Grandfather   . Cancer Maternal Grandfather   . Diabetes Maternal Grandfather   . Heart disease Maternal Grandfather   . Hypertension Maternal Grandfather   . Hyperlipidemia Maternal Grandfather   . Mental illness Brother   . Diabetes Maternal Grandmother   . Heart disease Maternal Grandmother   . Hypertension Maternal Grandmother   . Hyperlipidemia Maternal Grandmother   . Diabetes Paternal Grandmother   . Heart disease Paternal Grandmother   . Diabetes Paternal Grandfather   . Cancer Paternal Grandfather     Social History:  reports that she has never smoked. She has never used smokeless tobacco. She reports that she drinks alcohol. She reports that she does not use drugs.  Allergies:  Allergies  Allergen Reactions  . Bee Venom  Anaphylaxis, Hives, Shortness Of Breath and Swelling  . Penicillins Anaphylaxis    Has patient had a PCN reaction causing immediate rash, facial/tongue/throat swelling, SOB or lightheadedness with hypotension: yes Has patient had a PCN reaction causing severe rash involving mucus membranes or skin necrosis: unknown Has patient had a PCN reaction that required hospitalization : yes Has patient had a PCN reaction occurring within the last 10 years: no If all of the above answers are "NO", then may proceed with Cephalosporin use.     Prior to Admission medications   Medication Sig Start Date End Date Taking? Authorizing Provider  dapagliflozin  propanediol (FARXIGA) 5 MG TABS tablet Take 5 mg by mouth daily. 09/29/15  Yes Chelle Jeffery, PA-C  doxycycline (VIBRAMYCIN) 100 MG capsule Take 1 capsule (100 mg total) by mouth 2 (two) times daily. 01/23/16 02/02/16 Yes Chelle Jeffery, PA-C  norgestimate-ethinyl estradiol (ORTHO-CYCLEN,SPRINTEC,PREVIFEM) 0.25-35 MG-MCG tablet Take 1 tablet by mouth daily. 09/29/15  Yes Chelle Jeffery, PA-C  traZODone (DESYREL) 50 MG tablet Take 1 tablet (50 mg total) by mouth at bedtime as needed for sleep. Patient not taking: Reported on 01/28/2016 09/26/15   Joretta Bachelor, PA     Results for orders placed or performed during the hospital encounter of 01/28/16 (from the past 48 hour(s))  CBC with Differential     Status: Abnormal   Collection Time: 01/28/16  1:21 PM  Result Value Ref Range   WBC 16.4 (H) 4.0 - 10.5 K/uL   RBC 4.17 3.87 - 5.11 MIL/uL   Hemoglobin 12.2 12.0 - 15.0 g/dL   HCT 35.3 (L) 36.0 - 46.0 %   MCV 84.7 78.0 - 100.0 fL   MCH 29.3 26.0 - 34.0 pg   MCHC 34.6 30.0 - 36.0 g/dL   RDW 12.8 11.5 - 15.5 %   Platelets 308 150 - 400 K/uL   Neutrophils Relative % 76 %   Neutro Abs 12.4 (H) 1.7 - 7.7 K/uL   Lymphocytes Relative 14 %   Lymphs Abs 2.3 0.7 - 4.0 K/uL   Monocytes Relative 9 %   Monocytes Absolute 1.4 (H) 0.1 - 1.0 K/uL   Eosinophils Relative 1 %   Eosinophils Absolute 0.2 0.0 - 0.7 K/uL   Basophils Relative 0 %   Basophils Absolute 0.0 0.0 - 0.1 K/uL  Basic metabolic panel     Status: Abnormal   Collection Time: 01/28/16  1:21 PM  Result Value Ref Range   Sodium 134 (L) 135 - 145 mmol/L   Potassium 3.7 3.5 - 5.1 mmol/L   Chloride 100 (L) 101 - 111 mmol/L   CO2 26 22 - 32 mmol/L   Glucose, Bld 327 (H) 65 - 99 mg/dL   BUN 11 6 - 20 mg/dL   Creatinine, Ser 0.70 0.44 - 1.00 mg/dL   Calcium 8.7 (L) 8.9 - 10.3 mg/dL   GFR calc non Af Amer >60 >60 mL/min   GFR calc Af Amer >60 >60 mL/min    Comment: (NOTE) The eGFR has been calculated using the CKD EPI equation. This  calculation has not been validated in all clinical situations. eGFR's persistently <60 mL/min signify possible Chronic Kidney Disease.    Anion gap 8 5 - 15    No results found.  Review of Systems  Constitutional: Positive for malaise/fatigue. Negative for chills and fever.  Gastrointestinal: Positive for nausea.  Musculoskeletal: Positive for myalgias.   Blood pressure 124/87, pulse 103, temperature 98.7 F (37.1 C), temperature source Oral, resp. rate  20, last menstrual period 12/19/2015, SpO2 100 %. Physical Exam  Constitutional:  Obese female who is in no acute distress.  Eyes: No scleral icterus.  Respiratory:  Left breast-soft, no palpable masses or suspicious skin changes.  Right breast demonstrates periareolar erythema with induration and tenderness 9 o'clock position of the nipple areolar complex.  Musculoskeletal:  No right axillary adenopathy.  Neurological: She is alert.  Skin: Skin is warm and dry.  Psychiatric: She has a normal mood and affect. Her behavior is normal.    Assessment/Plan: 1. Right staph aureus breast abscess-resistant to tetracycline and thus I suspect doxycycline as well.  2. Poorly controlled diabetes mellitus.  Plan: Incision and drainage of right breast abscess with sedation and local anesthesia. Procedure and risks were discussed with her as well as aftercare. She may need to be admitted to the hospital by the medical service because she will need tight control of her diabetes if this is going to resolve and heal adequately.  She'll need to be put back on oral Bactrim since the staph aureus is sensitive to this. She will follow-up in our office next week.  Corey Caulfield J 01/28/2016, 2:43 PM

## 2016-01-28 NOTE — Telephone Encounter (Signed)
I have called [facility] Emergency room today to advise the patient is coming there for evaluation,advised the triage nurse that patient has a right breast abscess and he feels like she needs to see a surgeon urgently

## 2016-01-28 NOTE — ED Provider Notes (Signed)
[facility] DEPT Provider Note   CSN: [id number] Arrival date & time: 01/28/16  1213  By signing my name below, I, Reola Mosher, attest that this documentation has been prepared under the direction and in the presence of Virgel Manifold, MD. Electronically Signed: Reola Mosher, ED Scribe. 01/28/16. 12:47 PM.  History   Chief Complaint Chief Complaint  Patient presents with  . Abscess   The history is provided by the patient and medical records. No language interpreter was used.   HPI Comments: Sheila Austin is a 30 y.o. female with a PMHx of DM, who presents to the Emergency Department complaining of a moderate, gradually worsening area of pain and swelling to the right breast onset ~1 month ago. She describes her pain as burning and stinging, and notes associated nausea secondary to this issue. Pt was seen for same ~1 month ago at [facility] during the initial onset of this issue where she was placed on a course of Bactrim. Halfway through the course, she was seen at the  [facility] for f/u where she had a needle aspiration performed with 96cc return. At that time, she was also placed on Doxycycline, and has been taking both courses of antibiotics compliantly, but reports that her symptoms have continued to worsen otherwise. Pt states pain is worsened with palpation and direct pressure. Per pt, her CBG at home has been running high and has been uncontrolled. Denies fever, vomiting, chills, drainage from the area, or any other associated symptoms.   Past Medical History:  Diagnosis Date  . Diabetes mellitus   . E. coli sepsis ([place])   . Ectopic pregnancy   . Essential hypertension, benign 11/01/2008  . Fatty liver   . Gastroparesis   . GERD (gastroesophageal reflux disease)   . H. pylori infection   . Helicobacter pylori gastritis 09/07/2011  . Migraine   . Morbid obesity with BMI of 50.0-59.9, adult ([place]) 10/05/2011  . Pulmonary edema   . Pyelonephritis   . Ureteral  obstruction    Patient Active Problem List   Diagnosis Date Noted  . BMI 45.0-49.9, adult ([place]) 09/29/2015  . Skin lesions - right lower extremity 12/07/2013  . Morbid obesity with BMI of 50.0-59.9, adult ([place]) 10/05/2011  . Obstructive sleep apnea 10/05/2011  . Hypertriglyceridemia 09/27/2011  . Migraine 09/07/2011  . Essential hypertension, benign 11/01/2008  . Gastroparesis 09/27/2008  . IRREGULAR MENSES 08/24/2008  . Diabetes mellitus out of control ([place]) 07/04/2006   Past Surgical History:  Procedure Laterality Date  . ESOPHAGOGASTRODUODENOSCOPY  2010   OB History    Gravida Para Term Preterm AB Living   1       1 0   SAB TAB Ectopic Multiple Live Births       1         Home Medications    Prior to Admission medications   Medication Sig Start Date End Date Taking? Authorizing Provider  dapagliflozin propanediol (FARXIGA) 5 MG TABS tablet Take 5 mg by mouth daily. 09/29/15   Chelle Jeffery, PA-C  doxycycline (VIBRAMYCIN) 100 MG capsule Take 1 capsule (100 mg total) by mouth 2 (two) times daily. 01/23/16 02/02/16  Chelle Jeffery, PA-C  norgestimate-ethinyl estradiol (ORTHO-CYCLEN,SPRINTEC,PREVIFEM) 0.25-35 MG-MCG tablet Take 1 tablet by mouth daily. 09/29/15   Chelle Jeffery, PA-C  traZODone (DESYREL) 50 MG tablet Take 1 tablet (50 mg total) by mouth at bedtime as needed for sleep. Patient not taking: Reported on 01/28/2016 09/26/15   Joretta Bachelor, PA  Family History Family History  Problem Relation Age of Onset  . Arthritis Mother   . Asthma Mother   . COPD Mother   . Depression Mother   . Diabetes Mother   . Hypertension Mother   . Mental illness Mother   . Kidney disease Mother   . Crohn's disease Mother   . Aneurysm Father 46    brain  . Mental illness Sister   . Arthritis Maternal Aunt   . Asthma Maternal Aunt   . COPD Maternal Aunt   . Depression Maternal Aunt   . Diabetes Maternal Aunt   . Hypertension Maternal Aunt   . Mental illness Maternal Aunt    . Stroke Maternal Aunt   . Heart failure Maternal Aunt   . Heart failure Maternal Grandfather   . Cancer Maternal Grandfather   . Diabetes Maternal Grandfather   . Heart disease Maternal Grandfather   . Hypertension Maternal Grandfather   . Hyperlipidemia Maternal Grandfather   . Mental illness Brother   . Diabetes Maternal Grandmother   . Heart disease Maternal Grandmother   . Hypertension Maternal Grandmother   . Hyperlipidemia Maternal Grandmother   . Diabetes Paternal Grandmother   . Heart disease Paternal Grandmother   . Diabetes Paternal Grandfather   . Cancer Paternal Grandfather    Social History Social History  Substance Use Topics  . Smoking status: Never Smoker  . Smokeless tobacco: Never Used  . Alcohol use Yes     Comment: occasional   Allergies   Penicillins  Review of Systems Review of Systems  Constitutional: Negative for chills and fever.  Gastrointestinal: Positive for nausea. Negative for vomiting.  Musculoskeletal: Positive for myalgias.  Skin: Negative for wound.  All other systems reviewed and are negative.  Physical Exam Updated Vital Signs BP 124/74 (BP Location: Right Arm)   Pulse 115   Temp 98.7 F (37.1 C) (Oral)   Resp 16   LMP 12/19/2015 (Approximate)   SpO2 98%   Physical Exam  Constitutional: She appears well-developed and well-nourished. No distress.  HENT:  Head: Normocephalic and atraumatic.  Mouth/Throat: Oropharynx is clear and moist. No oropharyngeal exudate.  Eyes: Conjunctivae and EOM are normal. Pupils are equal, round, and reactive to light. Right eye exhibits no discharge. Left eye exhibits no discharge. No scleral icterus.  Neck: Normal range of motion. Neck supple. No JVD present. No thyromegaly present.  Cardiovascular: Regular rhythm, normal heart sounds and intact distal pulses.  Exam reveals no gallop and no friction rub.   No murmur heard. Mild tachcyardia  Pulmonary/Chest: Effort normal and breath sounds  normal. No respiratory distress. She has no wheezes. She has no rales.  Abdominal: Soft. Bowel sounds are normal. She exhibits no distension and no mass. There is no tenderness.  Musculoskeletal: Normal range of motion. She exhibits no edema or tenderness.  Lymphadenopathy:    She has no cervical adenopathy.  Neurological: She is alert. Coordination normal.  Skin: Skin is warm and dry. No rash noted. No erythema.  R breast significantly larger than the L. No axillary nodes palpated. Swelling/erythema of R breast circumfrentially around areola. Induration adjacent to areola around 2-3 o'clock position. Some puffiness of upper areola. No nipple inversion. ~10cm subareolar mass. No drainage.   Psychiatric: She has a normal mood and affect. Her behavior is normal.  Nursing note and vitals reviewed.  ED Treatments / Results  DIAGNOSTIC STUDIES: Oxygen Saturation is 98% on RA, normal by my interpretation.   COORDINATION OF  CARE: 12:45 PM-Discussed next steps with pt. Pt verbalized understanding and is agreeable with the plan.   Labs (all labs ordered are listed, but only abnormal results are displayed) Labs Reviewed - No data to display  EKG  EKG Interpretation None      Radiology No results found.  Procedures Procedures (including critical care time)  Preprocedure  Pre-anesthesia/induction confirmation of laterality/correct procedure site including "time-out."  Provider confirms review of the nurses' note, allergies, medications, pertinent labs, PMH, pre-induction vital signs, pulse oximetry, pain level, and ECG (as applicable), and patient condition satisfactory for commencing with order for sedation and procedure.  Sedation Date/Time: 01/28/2016 2:23 PM Performed by: Virgel Manifold, MD Authorized by: Virgel Manifold, MD  Consent:    Consent obtained:  Verbal   Consent given by:  Patient   Alternatives discussed:  Analgesia without sedation Indications:    Sedation purpose:  Abscess I&D   Procedure necessitating sedation performed by:  Physician performing sedation Procedure details (see MAR for exact dosages):    Sedation:  150mg  Ketamine   Intra-procedure monitoring:  Cardiac monitor, continuous pulse oximetry and blood pressure monitoring  Total time of sedation/monitoring: 40 minutes    Patient tolerated procedure and procedural sedation component as expected without apparent immediate complications.  Physician confirms procedural medication orders as administered, patient was assessed by physician post-procedure, and confirms post-sedation plan of care and disposition.  Medications Ordered in ED Medications - No data to display  Initial Impression / Assessment and Plan / ED Course  I have reviewed the triage vital signs and the nursing notes.  Pertinent labs & imaging results that were available during my care of the patient were reviewed by me and considered in my medical decision making (see chart for details).  Clinical Course   30yF with R breast abscess/cellulitis which is clinically worsening despite recent aspiration and abx. Will discuss with general surgery.  I&D'd at bedside by surgery. Pt previously on doxycycline. Given clindamycin in ED. Cultures show resistances to both though. Changed to bactrim.   Glucose is elevated, but not in DKA. Her blood sugars should improve with adequate infection control. Now that abscess is adequately I&D'd and on appropriate abx, I feel she can be discharged.   Final Clinical Impressions(s) / ED Diagnoses   Final diagnoses:  Abscess of right breast  Hyperglycemia    New Prescriptions New Prescriptions   No medications on file   I personally preformed the services scribed in my presence. The recorded information has been reviewed is accurate. Virgel Manifold, MD.     Virgel Manifold, MD 01/29/16 [phone]

## 2016-01-28 NOTE — Discharge Instructions (Signed)
Change bandage daily using a heavy sanitary pad.  Appointment with Dr. Zella Richer in 4-6 days.  Office will call you to make appointment.

## 2016-01-28 NOTE — ED Notes (Signed)
Conscious sedation performed for right breast I&D

## 2016-01-28 NOTE — Patient Instructions (Addendum)
Go directly to [facility] emergency department for further evaluation by surgeon.    IF you received an x-ray today, you will receive an invoice from [facility]. Please contact [facility] at [phone] with questions or concerns regarding your invoice.   IF you received labwork today, you will receive an invoice from [employer]. Please contact [facility] at [phone] with questions or concerns regarding your invoice.   Our billing staff will not be able to assist you with questions regarding bills from these companies.  You will be contacted with the lab results as soon as they are available. The fastest way to get your results is to activate your My Chart account. Instructions are located on the last page of this paperwork. If you have not heard from [place] regarding the results in 2 weeks, please contact this office.

## 2016-01-30 ENCOUNTER — Other Ambulatory Visit: Payer: 59

## 2016-01-30 MED FILL — HYDROCODON-APAP 5-325: 5-325 | 4 days supply | Qty: 30 | Fill #0

## 2016-02-15 ENCOUNTER — Telehealth: Payer: Self-pay | Admitting: Physician Assistant

## 2016-02-15 NOTE — Telephone Encounter (Signed)
-----   Message from Jackolyn Confer, MD sent at 02/01/2016  9:12 AM EDT ----- I drained a large (125 cc) right breast abscess 9/23.  Her initial blood sugar in the ED was > 400.  She looks much better today but needs tighter blood sugar control to decrease her risk of wound healing problems and recurrent infection.  Anything you can do to help her would be appreciated.  Thx,  TR

## 2016-02-20 ENCOUNTER — Other Ambulatory Visit: Payer: 59

## 2016-02-24 ENCOUNTER — Encounter: Payer: Self-pay | Admitting: Physician Assistant

## 2016-02-24 DIAGNOSIS — N611 Abscess of the breast and nipple: Secondary | ICD-10-CM | POA: Insufficient documentation

## 2016-02-28 MED FILL — SULFAMETHOXAZOLE/TMP DS TAB: 800-160 | 10 days supply | Qty: 20 | Fill #0

## 2016-06-07 ENCOUNTER — Encounter: Payer: Self-pay | Admitting: Physician Assistant

## 2016-08-20 ENCOUNTER — Emergency Department (HOSPITAL_COMMUNITY)
Admission: EM | Admit: 2016-08-20 | Discharge: 2016-08-20 | Disposition: A | Discharge status: Home / Self Care | Payer: 59 | Attending: Emergency Medicine | Admitting: Emergency Medicine

## 2016-08-20 ENCOUNTER — Encounter (HOSPITAL_COMMUNITY): Payer: Self-pay | Admitting: Emergency Medicine

## 2016-08-20 DIAGNOSIS — N390 Urinary tract infection, site not specified: Secondary | ICD-10-CM | POA: Diagnosis not present

## 2016-08-20 DIAGNOSIS — E860 Dehydration: Secondary | ICD-10-CM | POA: Diagnosis not present

## 2016-08-20 DIAGNOSIS — R197 Diarrhea, unspecified: Secondary | ICD-10-CM | POA: Diagnosis not present

## 2016-08-20 DIAGNOSIS — Z79899 Other long term (current) drug therapy: Secondary | ICD-10-CM | POA: Insufficient documentation

## 2016-08-20 DIAGNOSIS — I10 Essential (primary) hypertension: Secondary | ICD-10-CM | POA: Insufficient documentation

## 2016-08-20 DIAGNOSIS — R1110 Vomiting, unspecified: Secondary | ICD-10-CM | POA: Diagnosis not present

## 2016-08-20 DIAGNOSIS — R112 Nausea with vomiting, unspecified: Secondary | ICD-10-CM | POA: Diagnosis not present

## 2016-08-20 DIAGNOSIS — E119 Type 2 diabetes mellitus without complications: Secondary | ICD-10-CM | POA: Insufficient documentation

## 2016-08-20 LAB — COMPREHENSIVE METABOLIC PANEL
ALBUMIN: 3.6 g/dL (ref 3.5–5.0)
ALK PHOS: 69 U/L (ref 38–126)
ALT: 18 U/L (ref 14–54)
ANION GAP: 10 (ref 5–15)
AST: 19 U/L (ref 15–41)
BILIRUBIN TOTAL: 0.8 mg/dL (ref 0.3–1.2)
BUN: 16 mg/dL (ref 6–20)
CALCIUM: 9 mg/dL (ref 8.9–10.3)
CO2: 24 mmol/L (ref 22–32)
Chloride: 98 mmol/L — ABNORMAL LOW (ref 101–111)
Creatinine, Ser: 0.86 mg/dL (ref 0.44–1.00)
GFR calc non Af Amer: >60 mL/min (ref >60)
GLUCOSE: 369 mg/dL — AB (ref 65–99)
Potassium: 4.4 mmol/L (ref 3.5–5.1)
SODIUM: 132 mmol/L — AB (ref 135–145)
TOTAL PROTEIN: 8.1 g/dL (ref 6.5–8.1)

## 2016-08-20 LAB — URINALYSIS, ROUTINE W REFLEX MICROSCOPIC
Bilirubin Urine: NEGATIVE
KETONES UR: 5 mg/dL — AB
Leukocytes, UA: NEGATIVE
Nitrite: POSITIVE — AB
PH: 5 (ref 5.0–8.0)
PROTEIN: 30 mg/dL — AB
SPECIFIC GRAVITY, URINE: 1.025 (ref 1.005–1.030)

## 2016-08-20 LAB — LIPASE, BLOOD: Lipase: 28 U/L (ref 11–51)

## 2016-08-20 LAB — CBC
HCT: 44.9 % (ref 36.0–46.0)
HEMOGLOBIN: 15.9 g/dL — AB (ref 12.0–15.0)
MCH: 30.4 pg (ref 26.0–34.0)
MCHC: 35.4 g/dL (ref 30.0–36.0)
MCV: 85.9 fL (ref 78.0–100.0)
PLATELETS: 235 10*3/uL (ref 150–400)
RBC: 5.23 MIL/uL — ABNORMAL HIGH (ref 3.87–5.11)
RDW: 13.1 % (ref 11.5–15.5)
WBC: 14.1 10*3/uL — ABNORMAL HIGH (ref 4.0–10.5)

## 2016-08-20 MED ORDER — ONDANSETRON 4 MG PO TBDP
ORAL_TABLET | ORAL | Status: AC
  Administered 2016-08-20: 4 mg
  Filled 2016-08-20: qty 1

## 2016-08-20 MED ORDER — SODIUM CHLORIDE 0.9 % IV BOLUS (SEPSIS)
2000.0000 mL | Freq: Once | INTRAVENOUS | Status: AC
  Administered 2016-08-20: 2000 mL via INTRAVENOUS

## 2016-08-20 MED ORDER — PROMETHAZINE HCL 25 MG RE SUPP: 25.0000 mg | Freq: Four times a day (QID) | RECTAL | 0 refills | Status: DC | PRN

## 2016-08-20 MED ORDER — NITROFURANTOIN MONOHYD MACRO 100 MG PO CAPS: 100.0000 mg | ORAL_CAPSULE | Freq: Two times a day (BID) | ORAL | 0 refills | Status: DC

## 2016-08-20 MED ORDER — NITROFURANTOIN MONOHYD MACRO 100 MG PO CAPS
100.0000 mg | ORAL_CAPSULE | Freq: Once | ORAL | Status: AC
  Administered 2016-08-20: 100 mg via ORAL
  Filled 2016-08-20: qty 1

## 2016-08-20 MED ORDER — PROMETHAZINE HCL 25 MG/ML IJ SOLN
25.0000 mg | Freq: Once | INTRAMUSCULAR | Status: AC
  Administered 2016-08-20: 25 mg via INTRAVENOUS
  Filled 2016-08-20: qty 1

## 2016-08-20 MED ORDER — KETOROLAC TROMETHAMINE 30 MG/ML IJ SOLN
30.0000 mg | Freq: Once | INTRAMUSCULAR | Status: AC
  Administered 2016-08-20: 30 mg via INTRAVENOUS
  Filled 2016-08-20: qty 1

## 2016-08-20 MED ORDER — SODIUM CHLORIDE 0.9 % IV BOLUS (SEPSIS)
1000.0000 mL | Freq: Once | INTRAVENOUS | Status: AC
  Administered 2016-08-20: 1000 mL via INTRAVENOUS

## 2016-08-20 MED ORDER — PROMETHAZINE HCL 25 MG PO TABS: 25.0000 mg | ORAL_TABLET | Freq: Four times a day (QID) | ORAL | 0 refills | Status: DC | PRN

## 2016-08-20 MED ORDER — ONDANSETRON 4 MG PO TBDP: 4.0000 mg | ORAL_TABLET | Freq: Once | ORAL | Status: DC | PRN

## 2016-08-20 NOTE — ED Triage Notes (Signed)
Pt. Stated, I woke up and started having N/V/D since this morning. Now I feel so weak.

## 2016-08-20 NOTE — ED Notes (Signed)
Pt able to tolerate PO fluid and graham crackers

## 2016-08-20 NOTE — ED Provider Notes (Signed)
[facility] DEPT Provider Note   CSN: [id number] Arrival date & time: 08/20/16  1549     History   Chief Complaint Chief Complaint  Patient presents with  . Emesis  . Nausea  . Diarrhea    HPI Sheila Austin is a 31 y.o. female.   Emesis   This is a new problem. The current episode started less than 1 hour ago. The problem occurs 2 to 4 times per day. The problem has not changed since onset.The maximum temperature recorded prior to her arrival was 100 to 100.9 F. Associated symptoms include diarrhea.  Diarrhea   Associated symptoms include vomiting.    Past Medical History:  Diagnosis Date  . Diabetes mellitus   . E. coli sepsis ([place])   . Ectopic pregnancy   . Essential hypertension, benign 11/01/2008  . Fatty liver   . Gastroparesis   . GERD (gastroesophageal reflux disease)   . H. pylori infection   . Helicobacter pylori gastritis 09/07/2011  . Migraine   . Morbid obesity with BMI of 50.0-59.9, adult ([place]) 10/05/2011  . Pulmonary edema   . Pyelonephritis   . Ureteral obstruction     Patient Active Problem List   Diagnosis Date Noted  . Abscess of right breast 02/24/2016  . BMI 45.0-49.9, adult ([place]) 09/29/2015  . Skin lesions - right lower extremity 12/07/2013  . Morbid obesity with BMI of 50.0-59.9, adult ([place]) 10/05/2011  . Obstructive sleep apnea 10/05/2011  . Hypertriglyceridemia 09/27/2011  . Migraine 09/07/2011  . Essential hypertension, benign 11/01/2008  . Gastroparesis 09/27/2008  . IRREGULAR MENSES 08/24/2008  . Diabetes mellitus out of control ([place]) 07/04/2006    Past Surgical History:  Procedure Laterality Date  . ESOPHAGOGASTRODUODENOSCOPY  2010    OB History    Gravida Para Term Preterm AB Living   1       1 0   SAB TAB Ectopic Multiple Live Births       1           Home Medications    Prior to Admission medications   Medication Sig Start Date End Date Taking? Authorizing Provider  cetirizine (ZYRTEC) 10 MG tablet Take 10  mg by mouth daily as needed for allergies.   Yes Historical Provider, MD  loperamide (IMODIUM A-D) 2 MG tablet Take 2 mg by mouth 4 (four) times daily as needed for diarrhea or loose stools.   Yes Historical Provider, MD  traZODone (DESYREL) 50 MG tablet Take 1 tablet (50 mg total) by mouth at bedtime as needed for sleep. 09/26/15  Yes Dorian Heckle English, PA  nitrofurantoin, macrocrystal-monohydrate, (MACROBID) 100 MG capsule Take 1 capsule (100 mg total) by mouth 2 (two) times daily. 08/20/16   Merrily Pew, MD  promethazine (PHENERGAN) 25 MG suppository Place 1 suppository (25 mg total) rectally every 6 (six) hours as needed for nausea or vomiting. 08/20/16   Merrily Pew, MD  promethazine (PHENERGAN) 25 MG tablet Take 1 tablet (25 mg total) by mouth every 6 (six) hours as needed for nausea or vomiting. 08/20/16   Merrily Pew, MD    Family History Family History  Problem Relation Age of Onset  . Arthritis Mother   . Asthma Mother   . COPD Mother   . Depression Mother   . Diabetes Mother   . Hypertension Mother   . Mental illness Mother   . Kidney disease Mother   . Crohn's disease Mother   . Aneurysm Father 29  brain  . Mental illness Sister   . Arthritis Maternal Aunt   . Asthma Maternal Aunt   . COPD Maternal Aunt   . Depression Maternal Aunt   . Diabetes Maternal Aunt   . Hypertension Maternal Aunt   . Mental illness Maternal Aunt   . Stroke Maternal Aunt   . Heart failure Maternal Aunt   . Heart failure Maternal Grandfather   . Cancer Maternal Grandfather   . Diabetes Maternal Grandfather   . Heart disease Maternal Grandfather   . Hypertension Maternal Grandfather   . Hyperlipidemia Maternal Grandfather   . Mental illness Brother   . Diabetes Maternal Grandmother   . Heart disease Maternal Grandmother   . Hypertension Maternal Grandmother   . Hyperlipidemia Maternal Grandmother   . Diabetes Paternal Grandmother   . Heart disease Paternal Grandmother   . Diabetes  Paternal Grandfather   . Cancer Paternal Grandfather     Social History Social History  Substance Use Topics  . Smoking status: Never Smoker  . Smokeless tobacco: Never Used  . Alcohol use Yes     Comment: occasional     Allergies   Bee venom and Penicillins   Review of Systems Review of Systems  Gastrointestinal: Positive for diarrhea and vomiting.  All other systems reviewed and are negative.    Physical Exam Updated Vital Signs BP 109/79   Pulse (!) 120   Temp 99.1 F (37.3 C) (Oral)   Resp 19   LMP 07/24/2016   SpO2 94%   Physical Exam  Constitutional: She appears well-developed and well-nourished.  HENT:  Head: Normocephalic and atraumatic.  Eyes: Conjunctivae and EOM are normal.  Neck: Normal range of motion.  Cardiovascular: Regular rhythm.  Tachycardia present.   Pulmonary/Chest: No stridor. No respiratory distress.  Abdominal: Soft. She exhibits no distension. There is no tenderness.  Neurological: She is alert.  Skin: Skin is warm and dry.  Nursing note and vitals reviewed.    ED Treatments / Results  Labs (all labs ordered are listed, but only abnormal results are displayed) Labs Reviewed  COMPREHENSIVE METABOLIC PANEL - Abnormal; Notable for the following:       Result Value   Sodium 132 (*)    Chloride 98 (*)    Glucose, Bld 369 (*)    All other components within normal limits  CBC - Abnormal; Notable for the following:    WBC 14.1 (*)    RBC 5.23 (*)    Hemoglobin 15.9 (*)    All other components within normal limits  URINALYSIS, ROUTINE W REFLEX MICROSCOPIC - Abnormal; Notable for the following:    APPearance HAZY (*)    Glucose, UA >=500 (*)    Hgb urine dipstick MODERATE (*)    Ketones, ur 5 (*)    Protein, ur 30 (*)    Nitrite POSITIVE (*)    Bacteria, UA MANY (*)    Squamous Epithelial / LPF 0-5 (*)    All other components within normal limits  LIPASE, BLOOD    EKG  EKG Interpretation None       Radiology No  results found.  Procedures Procedures (including critical care time)  Medications Ordered in ED Medications  ondansetron (ZOFRAN-ODT) disintegrating tablet 4 mg (not administered)  ondansetron (ZOFRAN-ODT) 4 MG disintegrating tablet (4 mg  Given 08/20/16 1827)  sodium chloride 0.9 % bolus 2,000 mL (0 mLs Intravenous Stopped 08/20/16 2257)  promethazine (PHENERGAN) injection 25 mg (25 mg Intravenous Given 08/20/16 1938)  ketorolac (TORADOL) 30 MG/ML injection 30 mg (30 mg Intravenous Given 08/20/16 2231)  sodium chloride 0.9 % bolus 1,000 mL (0 mLs Intravenous Stopped 08/20/16 2316)  nitrofurantoin (macrocrystal-monohydrate) (MACROBID) capsule 100 mg (100 mg Oral Given 08/20/16 2315)     Initial Impression / Assessment and Plan / ED Course  I have reviewed the triage vital signs and the nursing notes.  Pertinent labs & imaging results that were available during my care of the patient were reviewed by me and considered in my medical decision making (see chart for details).        UTI, likely related to gastroenteritis. Tolerating PO. Discussed her HR with her and she states it is always high like this. On review of records, she has had many outpatient Visits the heart rate ranging from 106-122. Alley C1 visit in the last 5 years for her heart rate was under 100. I suspect she has a baseline higher than 100 and this is not indicative of any serious underlying disease at this time. Patient tolerating by mouth and is also making urine. Plan for discharge with nitrofurantoin and Phenergan.  Final Clinical Impressions(s) / ED Diagnoses   Final diagnoses:  Vomiting, intractability of vomiting not specified, presence of nausea not specified, unspecified vomiting type  Dehydration  Diarrhea, unspecified type  Urinary tract infection without hematuria, site unspecified    New Prescriptions Discharge Medication List as of 08/20/2016 11:14 PM    START taking these medications   Details    nitrofurantoin, macrocrystal-monohydrate, (MACROBID) 100 MG capsule Take 1 capsule (100 mg total) by mouth 2 (two) times daily., Starting Mon 08/20/2016, Print    promethazine (PHENERGAN) 25 MG suppository Place 1 suppository (25 mg total) rectally every 6 (six) hours as needed for nausea or vomiting., Starting Mon 08/20/2016, Print    promethazine (PHENERGAN) 25 MG tablet Take 1 tablet (25 mg total) by mouth every 6 (six) hours as needed for nausea or vomiting., Starting Mon 08/20/2016, Print         Merrily Pew, MD 08/21/16 8875

## 2016-08-20 NOTE — ED Notes (Signed)
Pt stated she is unable to provide urine sample until fluids are given.

## 2016-08-21 MED FILL — PHENADOZ 25 MG SUPP: 25 | 3 days supply | Qty: 12 | Fill #0

## 2016-08-21 MED FILL — NITROFURANTOIN MONO-MCR 100: 100 | 5 days supply | Qty: 10 | Fill #0

## 2016-08-21 MED FILL — PROMETHAZINE 25 MG TABLET: 25 | 4 days supply | Qty: 15 | Fill #0

## 2017-01-14 ENCOUNTER — Ambulatory Visit: Payer: 59 | Admitting: Physician Assistant

## 2017-04-24 ENCOUNTER — Emergency Department (HOSPITAL_COMMUNITY)
Admission: EM | Admit: 2017-04-24 | Discharge: 2017-04-24 | Disposition: A | Discharge status: Home / Self Care | Payer: 59 | Attending: Emergency Medicine | Admitting: Emergency Medicine

## 2017-04-24 ENCOUNTER — Other Ambulatory Visit: Payer: Self-pay

## 2017-04-24 ENCOUNTER — Encounter (HOSPITAL_COMMUNITY): Payer: Self-pay | Admitting: *Deleted

## 2017-04-24 DIAGNOSIS — L0291 Cutaneous abscess, unspecified: Secondary | ICD-10-CM

## 2017-04-24 DIAGNOSIS — Z79899 Other long term (current) drug therapy: Secondary | ICD-10-CM | POA: Diagnosis not present

## 2017-04-24 DIAGNOSIS — L02212 Cutaneous abscess of back [any part, except buttock]: Secondary | ICD-10-CM | POA: Insufficient documentation

## 2017-04-24 DIAGNOSIS — Z7984 Long term (current) use of oral hypoglycemic drugs: Secondary | ICD-10-CM | POA: Insufficient documentation

## 2017-04-24 DIAGNOSIS — E119 Type 2 diabetes mellitus without complications: Secondary | ICD-10-CM | POA: Insufficient documentation

## 2017-04-24 DIAGNOSIS — I10 Essential (primary) hypertension: Secondary | ICD-10-CM | POA: Diagnosis not present

## 2017-04-24 DIAGNOSIS — L03312 Cellulitis of back [any part except buttock]: Secondary | ICD-10-CM | POA: Diagnosis not present

## 2017-04-24 DIAGNOSIS — R222 Localized swelling, mass and lump, trunk: Secondary | ICD-10-CM | POA: Diagnosis not present

## 2017-04-24 LAB — URINALYSIS, ROUTINE W REFLEX MICROSCOPIC
Bilirubin Urine: NEGATIVE
KETONES UR: 5 mg/dL — AB
NITRITE: POSITIVE — AB
PROTEIN: 30 mg/dL — AB
Specific Gravity, Urine: 1.032 — ABNORMAL HIGH (ref 1.005–1.030)
pH: 5 (ref 5.0–8.0)

## 2017-04-24 LAB — CBC
HCT: 40.4 % (ref 36.0–46.0)
Hemoglobin: 14.1 g/dL (ref 12.0–15.0)
MCH: 30.3 pg (ref 26.0–34.0)
MCHC: 34.9 g/dL (ref 30.0–36.0)
MCV: 86.7 fL (ref 78.0–100.0)
Platelets: 269 10*3/uL (ref 150–400)
RBC: 4.66 MIL/uL (ref 3.87–5.11)
RDW: 13.3 % (ref 11.5–15.5)
WBC: 16.1 10*3/uL — ABNORMAL HIGH (ref 4.0–10.5)

## 2017-04-24 LAB — CBG MONITORING, ED
GLUCOSE-CAPILLARY: 336 mg/dL — AB (ref 65–99)
Glucose-Capillary: 406 mg/dL — ABNORMAL HIGH (ref 65–99)

## 2017-04-24 LAB — BASIC METABOLIC PANEL
Anion gap: 13 (ref 5–15)
BUN: 11 mg/dL (ref 6–20)
CALCIUM: 8.9 mg/dL (ref 8.9–10.3)
CO2: 22 mmol/L (ref 22–32)
Chloride: 92 mmol/L — ABNORMAL LOW (ref 101–111)
Creatinine, Ser: 0.72 mg/dL (ref 0.44–1.00)
GFR calc Af Amer: >60 mL/min (ref >60)
GLUCOSE: 427 mg/dL — AB (ref 65–99)
Potassium: 4.1 mmol/L (ref 3.5–5.1)
Sodium: 127 mmol/L — ABNORMAL LOW (ref 135–145)

## 2017-04-24 LAB — I-STAT BETA HCG BLOOD, ED (MC, WL, AP ONLY)

## 2017-04-24 MED ORDER — INSULIN ASPART 100 UNIT/ML ~~LOC~~ SOLN
10.0000 [IU] | Freq: Once | SUBCUTANEOUS | Status: AC
  Administered 2017-04-24: 10 [IU] via SUBCUTANEOUS
  Filled 2017-04-24: qty 1

## 2017-04-24 MED ORDER — CLINDAMYCIN HCL 150 MG PO CAPS
600.0000 mg | ORAL_CAPSULE | Freq: Once | ORAL | Status: AC
  Administered 2017-04-24: 600 mg via ORAL
  Filled 2017-04-24: qty 4

## 2017-04-24 MED ORDER — LIDOCAINE-EPINEPHRINE (PF) 2 %-1:200000 IJ SOLN
20.0000 mL | Freq: Once | INTRAMUSCULAR | Status: AC
  Administered 2017-04-24: 20 mL
  Filled 2017-04-24: qty 20

## 2017-04-24 MED ORDER — CLINDAMYCIN HCL 300 MG PO CAPS: 300.0000 mg | ORAL_CAPSULE | Freq: Four times a day (QID) | ORAL | 0 refills | Status: DC

## 2017-04-24 MED ORDER — DAPAGLIFLOZIN PROPANEDIOL 5 MG PO TABS: 5.0000 mg | ORAL_TABLET | Freq: Every day | ORAL | 1 refills | Status: DC

## 2017-04-24 MED FILL — CLINDAMYCIN HCL 300 MG CAPS: 300 | 7 days supply | Qty: 28 | Fill #0

## 2017-04-24 NOTE — ED Notes (Signed)
Suture cart/I&D tray outside of room.

## 2017-04-24 NOTE — ED Notes (Signed)
CBG taken at 16:23 was 336.

## 2017-04-24 NOTE — Discharge Instructions (Signed)
Please take the medicines prescribed for the abscess. Return in 2 days for wound re-check - you may go to the urgent care.  See your doctor for ideal control of diabetes.  As discussed, urine has some signs of inflammation.  If you notice that you are having more urinary symptoms like pain with urination, frequent urination, blood in the urine, back pain, chills, fevers despite the antibiotics given in the ED return to the ER right away.

## 2017-04-24 NOTE — ED Provider Notes (Signed)
[facility] EMERGENCY DEPARTMENT Provider Note   CSN: [id number] Arrival date & time: 04/24/17  1025     History   Chief Complaint Chief Complaint  Patient presents with  . Abscess  . Hyperglycemia    HPI Sheila Austin is a 31 y.o. female.  HPI Patient comes in with chief complaint of back pain. Patient has poorly controlled diabetes.  Patient reports that she noted some pain to the back about 5 days ago.  Patient had her husband pop the lesion, and there was some bloody drainage.  Since then the area has gotten more painful and the lesion is gotten larger.  Patient also has been having some fevers.  Patient has been not taking her diabetes medications.  She does have insurance, but has had difficulty affording the medications.  At triage urine analysis was sent out, and there are many bacteria and positive WBCs.  Patient denies any dysuria, hematuria, polyuria that is out of ordinary.  Patient has no back pain in the lower quadrants.    Past Medical History:  Diagnosis Date  . Diabetes mellitus   . E. coli sepsis ([place])   . Ectopic pregnancy   . Essential hypertension, benign 11/01/2008  . Fatty liver   . Gastroparesis   . GERD (gastroesophageal reflux disease)   . H. pylori infection   . Helicobacter pylori gastritis 09/07/2011  . Migraine   . Morbid obesity with BMI of 50.0-59.9, adult ([place]) 10/05/2011  . Pulmonary edema   . Pyelonephritis   . Ureteral obstruction     Patient Active Problem List   Diagnosis Date Noted  . Abscess of right breast 02/24/2016  . BMI 45.0-49.9, adult ([place]) 09/29/2015  . Skin lesions - right lower extremity 12/07/2013  . Morbid obesity with BMI of 50.0-59.9, adult ([place]) 10/05/2011  . Obstructive sleep apnea 10/05/2011  . Hypertriglyceridemia 09/27/2011  . Migraine 09/07/2011  . Essential hypertension, benign 11/01/2008  . Gastroparesis 09/27/2008  . IRREGULAR MENSES 08/24/2008  . Diabetes mellitus out of  control ([place]) 07/04/2006    Past Surgical History:  Procedure Laterality Date  . ESOPHAGOGASTRODUODENOSCOPY  2010    OB History    Gravida Para Term Preterm AB Living   1       1 0   SAB TAB Ectopic Multiple Live Births       1           Home Medications    Prior to Admission medications   Medication Sig Start Date End Date Taking? Authorizing Provider  cetirizine (ZYRTEC) 10 MG tablet Take 10 mg by mouth daily as needed for allergies.    [provider]  clindamycin (CLEOCIN) 300 MG capsule Take 1 capsule (300 mg total) by mouth 4 (four) times daily. 04/24/17   Varney Biles, MD  dapagliflozin propanediol (FARXIGA) 5 MG TABS tablet Take 5 mg by mouth daily. 04/24/17   Varney Biles, MD  loperamide (IMODIUM A-D) 2 MG tablet Take 2 mg by mouth 4 (four) times daily as needed for diarrhea or loose stools.    [provider]  nitrofurantoin, macrocrystal-monohydrate, (MACROBID) 100 MG capsule Take 1 capsule (100 mg total) by mouth 2 (two) times daily. 08/20/16   Mesner, Corene Cornea, MD  promethazine (PHENERGAN) 25 MG suppository Place 1 suppository (25 mg total) rectally every 6 (six) hours as needed for nausea or vomiting. 08/20/16   Mesner, Corene Cornea, MD  promethazine (PHENERGAN) 25 MG tablet Take 1 tablet (25 mg total) by  mouth every 6 (six) hours as needed for nausea or vomiting. 08/20/16   Mesner, Corene Cornea, MD  traZODone (DESYREL) 50 MG tablet Take 1 tablet (50 mg total) by mouth at bedtime as needed for sleep. 09/26/15   Joretta Bachelor, PA    Family History Family History  Problem Relation Age of Onset  . Arthritis Mother   . Asthma Mother   . COPD Mother   . Depression Mother   . Diabetes Mother   . Hypertension Mother   . Mental illness Mother   . Kidney disease Mother   . Crohn's disease Mother   . Aneurysm Father 46       brain  . Mental illness Sister   . Arthritis Maternal Aunt   . Asthma Maternal Aunt   . COPD Maternal Aunt   . Depression Maternal  Aunt   . Diabetes Maternal Aunt   . Hypertension Maternal Aunt   . Mental illness Maternal Aunt   . Stroke Maternal Aunt   . Heart failure Maternal Aunt   . Heart failure Maternal Grandfather   . Cancer Maternal Grandfather   . Diabetes Maternal Grandfather   . Heart disease Maternal Grandfather   . Hypertension Maternal Grandfather   . Hyperlipidemia Maternal Grandfather   . Mental illness Brother   . Diabetes Maternal Grandmother   . Heart disease Maternal Grandmother   . Hypertension Maternal Grandmother   . Hyperlipidemia Maternal Grandmother   . Diabetes Paternal Grandmother   . Heart disease Paternal Grandmother   . Diabetes Paternal Grandfather   . Cancer Paternal Grandfather     Social History Social History   Tobacco Use  . Smoking status: Never Smoker  . Smokeless tobacco: Never Used  Substance Use Topics  . Alcohol use: Yes    Comment: occasional  . Drug use: No     Allergies   Bee venom and Penicillins   Review of Systems Review of Systems  Constitutional: Positive for activity change and fever.  Respiratory: Negative for shortness of breath.   Cardiovascular: Negative for chest pain.  Gastrointestinal: Negative for vomiting.  Genitourinary: Negative for dysuria, flank pain, frequency, hematuria and pelvic pain.  Skin: Positive for rash.  All other systems reviewed and are negative.    Physical Exam Updated Vital Signs BP (!) 115/96 (BP Location: Right Arm)   Pulse (!) 121   Temp 98.2 F (36.8 C) (Oral)   Resp 20   LMP 04/17/2017 (Approximate)   SpO2 99%   Physical Exam  Constitutional: She is oriented to person, place, and time. She appears well-developed.  HENT:  Head: Normocephalic and atraumatic.  Eyes: EOM are normal.  Neck: Normal range of motion. Neck supple.  Cardiovascular: Normal rate.  Pulmonary/Chest: Effort normal.  Abdominal: Bowel sounds are normal.  Musculoskeletal:  Large area of induration, with surrounding  cellulitis over the thoracic back towards the midline. Tenderness to palpation  Neurological: She is alert and oriented to person, place, and time.  Skin: Skin is warm and dry.  Nursing note and vitals reviewed.    ED Treatments / Results  Labs (all labs ordered are listed, but only abnormal results are displayed) Labs Reviewed  BASIC METABOLIC PANEL - Abnormal; Notable for the following components:      Result Value   Sodium 127 (*)    Chloride 92 (*)    Glucose, Bld 427 (*)    All other components within normal limits  CBC - Abnormal; Notable for the following  components:   WBC 16.1 (*)    All other components within normal limits  URINALYSIS, ROUTINE W REFLEX MICROSCOPIC - Abnormal; Notable for the following components:   APPearance HAZY (*)    Specific Gravity, Urine 1.032 (*)    Glucose, UA >=500 (*)    Hgb urine dipstick MODERATE (*)    Ketones, ur 5 (*)    Protein, ur 30 (*)    Nitrite POSITIVE (*)    Leukocytes, UA MODERATE (*)    Bacteria, UA MANY (*)    Squamous Epithelial / LPF 0-5 (*)    All other components within normal limits  CBG MONITORING, ED - Abnormal; Notable for the following components:   Glucose-Capillary 406 (*)    All other components within normal limits  CBG MONITORING, ED - Abnormal; Notable for the following components:   Glucose-Capillary 336 (*)    All other components within normal limits  I-STAT BETA HCG BLOOD, ED ([facility], [facility], [facility] ONLY)    EKG  EKG Interpretation None       Radiology No results found.  Procedures .Marland KitchenIncision and Drainage Date/Time: 04/24/2017 5:05 PM Performed by: Varney Biles, MD Authorized by: Varney Biles, MD   Consent:    Consent obtained:  Verbal   Consent given by:  Patient   Risks discussed:  Bleeding, incomplete drainage, infection and pain   Alternatives discussed:  No treatment Location:    Type:  Abscess   Size:  4 cm   Location:  Trunk   Trunk location:  Back Pre-procedure details:     Skin preparation:  Betadine and Chloraprep Anesthesia (see MAR for exact dosages):    Anesthesia method:  Local infiltration   Local anesthetic:  Lidocaine 2% WITH epi Procedure type:    Complexity:  Simple Procedure details:    Needle aspiration: no     Incision types:  Single straight   Incision depth:  Subcutaneous   Scalpel blade:  10   Drainage:  Purulent and bloody   Drainage amount:  Moderate   Wound treatment:  Drain placed   Packing materials:  1/4 in gauze Post-procedure details:    Patient tolerance of procedure:  Tolerated well, no immediate complications   (including critical care time)  EMERGENCY DEPARTMENT US SOFT TISSUE INTERPRETATION "Study: Limited Soft Tissue Ultrasound"  INDICATIONS: Soft tissue infection Multiple views of the body part were obtained in real-time with a multi-frequency linear probe PERFORMED BY:  Myself IMAGES ARCHIVED?: Yes SIDE:Midline BODY PART:Upper back FINDINGS: Abcess present and Cellulitis present INTERPRETATION:  Abcess present and Cellulitis present     Medications Ordered in ED Medications  insulin aspart (novoLOG) injection 10 Units (10 Units Subcutaneous Given 04/24/17 1554)  lidocaine-EPINEPHrine (XYLOCAINE W/EPI) 2 %-1:200000 (PF) injection 20 mL (20 mLs Infiltration Given 04/24/17 1554)  clindamycin (CLEOCIN) capsule 600 mg (600 mg Oral Given 04/24/17 1642)     Initial Impression / Assessment and Plan / ED Course  I have reviewed the triage vital signs and the nursing notes.  Pertinent labs & imaging results that were available during my care of the patient were reviewed by me and considered in my medical decision making (see chart for details).    Patient comes in with chief complaint of upper back pain.  Patient is noted to have cellulitis with abscess in that area.  Ultrasound confirms an abscess pocket which is drainable.  I&D will be performed in the ER.  Patient has history of infection in the past and was put  on clindamycin for breast infection.  UA is showing some markers of infection. Patient has had UTI in the past and reports that she typically gets symptoms.   Review of her chart shows that patient did have E. coli sepsis in the past with resistance to Bactrim.  Patient has anaphylaxis listed as reaction to penicillins.  We have decided to start patient on clindamycin for her back infection.  Patient has been informed about the abnormal urine analysis, and she has been advised to return to the ER if her symptoms get worse.  Patient was on [place] for diabetes.  We will provide her with prescription and speak with case management about help with co-pay.  Final Clinical Impressions(s) / ED Diagnoses   Final diagnoses:  Abscess  Cellulitis of back except buttock    ED Discharge Orders        Ordered    clindamycin (CLEOCIN) 300 MG capsule  4 times daily     04/24/17 1643    dapagliflozin propanediol (FARXIGA) 5 MG TABS tablet  Daily     04/24/17 [place], Marcea Rojek, MD 04/24/17 1706

## 2017-04-24 NOTE — ED Notes (Signed)
Patient given discharge instructions and verbalized understanding.  Patient stable to discharge at this time.  Patient is alert and oriented to baseline.  No distressed noted at this time.  All belongings taken with the patient at discharge.   

## 2017-04-24 NOTE — Care Management (Signed)
ED CM consulted by Dr. Kathrynn Humble concerning patient reporting she is not able to afford insulins. Patient is a [employer] employee and has UMR but still unable to afford co-pays after covering  Monthly expenses. CM discussed [facility] care management program for [employer] [employer] [facility] employees, patient was unfamiliar. CM offered to place an ED referral to [facility] patient agreeable,refrral placed and patient informed that a nurse will contact her by phone 1-3 days post discharge and  given contact info for [facility]. No further ED CM needs identified

## 2017-04-24 NOTE — ED Triage Notes (Addendum)
Pt appears to have an abscess to back need bra strap.  There is redness and has been popped.  Pt had a fever 100.3. Pt is diabetic and has not been taking it.

## 2017-04-25 ENCOUNTER — Other Ambulatory Visit: Payer: Self-pay | Admitting: *Deleted

## 2017-04-25 NOTE — Patient Outreach (Signed)
Received referral from nurse case manager at [facility] Emergency Department as patient indicated she was having trouble affording her diabetes medications. Sheila Austin was seen in the emergency department on 04/24/17 for an upper back abscess that required incision and drainage. Her blood sugars during her emergency department stay were 305, 406, 336, and 427.  Her white count was 16.1 and her urinalysis showed >500 glucose, ketones of 5, kidney functions normal. She was discharged the same day on clindamycin and [place].  Left voice mail on her cell number requesting return call so that needs assessment can be completed and services provided by [facility] CM at no cost to [place] medial plan members can be discussed. Await return call. Barrington Ellison RN,CCM,CDE [place] Management Coordinator [facility] [employer] [phone] Office Fax [phone]

## 2017-04-29 ENCOUNTER — Other Ambulatory Visit: Payer: Self-pay | Admitting: *Deleted

## 2017-04-29 ENCOUNTER — Encounter: Payer: Self-pay | Admitting: *Deleted

## 2017-04-29 NOTE — Patient Outreach (Signed)
Third and final attempt to reach Sheila Austin to assess needs and offer services of [facility] CM. Left voice mail requesting return call. Will mail letter to [facility] home address providing [facility] CM information brochure and contact numbers should she have questions about services in the future. Barrington Ellison RN,CCM,CDE [place] Management Coordinator [facility] [employer] [phone] Office Fax [phone]

## 2017-04-29 NOTE — Patient Outreach (Signed)
Second attempt to reach [place] after referral to [place] received on 04/24/17  from [facility] Emergency Department  for assist with financial distress related to her diabetes medication. Nolene was seen there for abscess on her upper back and was discharged on Farxiga and Clindamycin. Her blood sugars were elevated throughout her hospital visit. Left another message on Mariella's cell phone requesting return call. Will attempt third and final outreach on 04/29/17.   Barrington Ellison RN,CCM,CDE [place] Management Coordinator [facility] [employer] [phone] Office Fax [phone]

## 2017-06-13 ENCOUNTER — Emergency Department (HOSPITAL_COMMUNITY): Payer: 59

## 2017-06-13 ENCOUNTER — Encounter (HOSPITAL_COMMUNITY): Payer: Self-pay | Admitting: Emergency Medicine

## 2017-06-13 ENCOUNTER — Other Ambulatory Visit: Payer: Self-pay

## 2017-06-13 DIAGNOSIS — Z5321 Procedure and treatment not carried out due to patient leaving prior to being seen by health care provider: Secondary | ICD-10-CM | POA: Insufficient documentation

## 2017-06-13 DIAGNOSIS — R05 Cough: Secondary | ICD-10-CM | POA: Insufficient documentation

## 2017-06-13 NOTE — ED Triage Notes (Signed)
Pt states she has a cough that started about 8 days ago and has progressively gotten worse  Pt states her chest hurts when she coughs  Fever started today  Pt has been using OTC medicatons without relief

## 2017-06-14 ENCOUNTER — Emergency Department (HOSPITAL_COMMUNITY)
Admission: EM | Admit: 2017-06-14 | Discharge: 2017-06-14 | Disposition: A | Discharge status: Home / Self Care | Payer: 59 | Attending: Emergency Medicine | Admitting: Emergency Medicine

## 2017-06-14 NOTE — ED Notes (Signed)
Called  No response from lobby 

## 2017-06-16 ENCOUNTER — Emergency Department (HOSPITAL_COMMUNITY)
Admission: EM | Admit: 2017-06-16 | Discharge: 2017-06-16 | Disposition: A | Discharge status: Home / Self Care | Payer: 59 | Attending: Emergency Medicine | Admitting: Emergency Medicine

## 2017-06-16 ENCOUNTER — Emergency Department (HOSPITAL_COMMUNITY): Payer: 59

## 2017-06-16 ENCOUNTER — Other Ambulatory Visit: Payer: Self-pay

## 2017-06-16 ENCOUNTER — Encounter (HOSPITAL_COMMUNITY): Payer: Self-pay

## 2017-06-16 DIAGNOSIS — E1165 Type 2 diabetes mellitus with hyperglycemia: Secondary | ICD-10-CM | POA: Insufficient documentation

## 2017-06-16 DIAGNOSIS — R062 Wheezing: Secondary | ICD-10-CM | POA: Diagnosis not present

## 2017-06-16 DIAGNOSIS — Z79899 Other long term (current) drug therapy: Secondary | ICD-10-CM | POA: Diagnosis not present

## 2017-06-16 DIAGNOSIS — I10 Essential (primary) hypertension: Secondary | ICD-10-CM | POA: Diagnosis not present

## 2017-06-16 DIAGNOSIS — R739 Hyperglycemia, unspecified: Secondary | ICD-10-CM

## 2017-06-16 DIAGNOSIS — R6889 Other general symptoms and signs: Secondary | ICD-10-CM

## 2017-06-16 DIAGNOSIS — R000 Tachycardia, unspecified: Secondary | ICD-10-CM | POA: Diagnosis not present

## 2017-06-16 DIAGNOSIS — J111 Influenza due to unidentified influenza virus with other respiratory manifestations: Secondary | ICD-10-CM | POA: Diagnosis not present

## 2017-06-16 DIAGNOSIS — R05 Cough: Secondary | ICD-10-CM | POA: Diagnosis not present

## 2017-06-16 LAB — URINALYSIS, ROUTINE W REFLEX MICROSCOPIC
BILIRUBIN URINE: NEGATIVE
Glucose, UA: >=500 mg/dL — AB
Ketones, ur: 20 mg/dL — AB
LEUKOCYTES UA: NEGATIVE
Nitrite: NEGATIVE
PROTEIN: 100 mg/dL — AB
Specific Gravity, Urine: 1.03 (ref 1.005–1.030)
pH: 5 (ref 5.0–8.0)

## 2017-06-16 LAB — BASIC METABOLIC PANEL
Anion gap: 15 (ref 5–15)
BUN: 9 mg/dL (ref 6–20)
CALCIUM: 9 mg/dL (ref 8.9–10.3)
CO2: 21 mmol/L — ABNORMAL LOW (ref 22–32)
CREATININE: 0.81 mg/dL (ref 0.44–1.00)
Chloride: 94 mmol/L — ABNORMAL LOW (ref 101–111)
GFR calc Af Amer: >60 mL/min (ref >60)
Glucose, Bld: 369 mg/dL — ABNORMAL HIGH (ref 65–99)
POTASSIUM: 5.3 mmol/L — AB (ref 3.5–5.1)
SODIUM: 130 mmol/L — AB (ref 135–145)

## 2017-06-16 LAB — CBC WITH DIFFERENTIAL/PLATELET
Basophils Absolute: 0.1 10*3/uL (ref 0.0–0.1)
Basophils Relative: 0 %
EOS ABS: 0.1 10*3/uL (ref 0.0–0.7)
EOS PCT: 1 %
HCT: 41.1 % (ref 36.0–46.0)
Hemoglobin: 14.5 g/dL (ref 12.0–15.0)
LYMPHS ABS: 2.1 10*3/uL (ref 0.7–4.0)
Lymphocytes Relative: 13 %
MCH: 30.7 pg (ref 26.0–34.0)
MCHC: 35.3 g/dL (ref 30.0–36.0)
MCV: 86.9 fL (ref 78.0–100.0)
MONO ABS: 1.7 10*3/uL — AB (ref 0.1–1.0)
Monocytes Relative: 11 %
Neutro Abs: 12.4 10*3/uL — ABNORMAL HIGH (ref 1.7–7.7)
Neutrophils Relative %: 75 %
PLATELETS: 271 10*3/uL (ref 150–400)
RBC: 4.73 MIL/uL (ref 3.87–5.11)
RDW: 12.8 % (ref 11.5–15.5)
WBC: 16.4 10*3/uL — ABNORMAL HIGH (ref 4.0–10.5)

## 2017-06-16 LAB — INFLUENZA PANEL BY PCR (TYPE A & B)
INFLAPCR: NEGATIVE
Influenza B By PCR: NEGATIVE

## 2017-06-16 LAB — I-STAT BETA HCG BLOOD, ED (MC, WL, AP ONLY): I-stat hCG, quantitative: <5 m[IU]/mL (ref <5)

## 2017-06-16 MED ORDER — ALBUTEROL SULFATE HFA 108 (90 BASE) MCG/ACT IN AERS
2.0000 | INHALATION_SPRAY | Freq: Once | RESPIRATORY_TRACT | Status: AC
  Administered 2017-06-16: 2 via RESPIRATORY_TRACT
  Filled 2017-06-16: qty 6.7

## 2017-06-16 MED ORDER — SODIUM CHLORIDE 0.9 % IV BOLUS (SEPSIS)
1000.0000 mL | Freq: Once | INTRAVENOUS | Status: AC
  Administered 2017-06-16: 1000 mL via INTRAVENOUS

## 2017-06-16 MED ORDER — ONDANSETRON HCL 4 MG/2ML IJ SOLN
4.0000 mg | Freq: Once | INTRAMUSCULAR | Status: AC
  Administered 2017-06-16: 4 mg via INTRAVENOUS
  Filled 2017-06-16: qty 2

## 2017-06-16 MED ORDER — ACETAMINOPHEN 325 MG PO TABS
650.0000 mg | ORAL_TABLET | Freq: Once | ORAL | Status: AC
Start: 2017-06-16 — End: 2017-06-16
  Administered 2017-06-16: 650 mg via ORAL
  Filled 2017-06-16: qty 2

## 2017-06-16 MED ORDER — BENZONATATE 100 MG PO CAPS: 100.0000 mg | ORAL_CAPSULE | Freq: Three times a day (TID) | ORAL | 0 refills | Status: DC

## 2017-06-16 MED ORDER — IPRATROPIUM-ALBUTEROL 0.5-2.5 (3) MG/3ML IN SOLN
3.0000 mL | Freq: Once | RESPIRATORY_TRACT | Status: AC
  Administered 2017-06-16: 3 mL via RESPIRATORY_TRACT
  Filled 2017-06-16: qty 3

## 2017-06-16 MED ORDER — KETOROLAC TROMETHAMINE 30 MG/ML IJ SOLN
30.0000 mg | Freq: Once | INTRAMUSCULAR | Status: AC
  Administered 2017-06-16: 30 mg via INTRAVENOUS
  Filled 2017-06-16: qty 1

## 2017-06-16 MED ORDER — PROMETHAZINE HCL 25 MG PO TABS: 25.0000 mg | ORAL_TABLET | Freq: Four times a day (QID) | ORAL | 0 refills | Status: DC | PRN

## 2017-06-16 NOTE — ED Notes (Signed)
Declined W/C at D/C and was escorted to lobby by RN. 

## 2017-06-16 NOTE — ED Triage Notes (Signed)
Per Pt, Pt is coming from home with complaints of cough, congestion, generalized fatigue x 2 weeks. Pt reports that she has just been getting worse. Reports fevers at home.

## 2017-06-16 NOTE — Discharge Instructions (Signed)
Take Tylenol or Motrin for your body aches and fever.  Drink plenty of fluids.  Your blood sugar was elevated today, make sure to decrease your carbohydrate intake and take your medications as prescribed.  Use inhaler 2 puffs every 4 hours for wheezing and shortness of breath.  Tessalon for cough as prescribed as needed.  Take Phenergan as prescribed for nausea and vomiting.  Please follow-up with your family doctor as soon as possible, call them tomorrow.  Your heart rate was very high in emergency department and does need further recheck and workup.  Return if worsening symptoms

## 2017-06-16 NOTE — ED Provider Notes (Signed)
[facility] EMERGENCY DEPARTMENT Provider Note   CSN: [id number] Arrival date & time: 06/16/17  1302     History   Chief Complaint Chief Complaint  Patient presents with  . Cough  . Generalized Body Aches    HPI Sheila Austin is a 32 y.o. female.  HPI Sheila Austin is a 32 y.o. female with history of diabetes, hypertension, gastroparesis, migraine headaches, history of pulmonary edema, presents to emergency department with flulike symptoms.  Patient states she has had cough and congestion for about 2 weeks, however her severe symptoms started about 4-5 days ago.  She reports nasal congestion, sore throat, cough, body aches, loss of appetite.  Patient states she is unable to eat or drink anything because she feels so bad.  She admits to nausea, no vomiting.  Denies any diarrhea.  Denies any abdominal pain.  States she has not eaten anything in 48 hours.  She has been taking Robitussin, Tylenol, Motrin, all with no relief.  States took Robitussin today, did not take any Tylenol and Motrin.  She states she feels weak, unable to get up and walk due to weakness.  Denies any urinary symptoms.  No vaginal discharge or bleeding.  Denies being pregnant.  No sick contacts.  Patient does work at the hospital.  Past Medical History:  Diagnosis Date  . Diabetes mellitus   . E. coli sepsis ([place])   . Ectopic pregnancy   . Essential hypertension, benign 11/01/2008  . Fatty liver   . Gastroparesis   . GERD (gastroesophageal reflux disease)   . H. pylori infection   . Helicobacter pylori gastritis 09/07/2011  . Migraine   . Morbid obesity with BMI of 50.0-59.9, adult ([place]) 10/05/2011  . Pulmonary edema   . Pyelonephritis   . Ureteral obstruction     Patient Active Problem List   Diagnosis Date Noted  . Abscess of right breast 02/24/2016  . BMI 45.0-49.9, adult ([place]) 09/29/2015  . Skin lesions - right lower extremity 12/07/2013  . Morbid obesity with BMI of 50.0-59.9,  adult ([place]) 10/05/2011  . Obstructive sleep apnea 10/05/2011  . Hypertriglyceridemia 09/27/2011  . Migraine 09/07/2011  . Essential hypertension, benign 11/01/2008  . Gastroparesis 09/27/2008  . IRREGULAR MENSES 08/24/2008  . Diabetes mellitus out of control ([place]) 07/04/2006    Past Surgical History:  Procedure Laterality Date  . ESOPHAGOGASTRODUODENOSCOPY  2010    OB History    Gravida Para Term Preterm AB Living   1       1 0   SAB TAB Ectopic Multiple Live Births       1           Home Medications    Prior to Admission medications   Medication Sig Start Date End Date Taking? Authorizing Provider  cetirizine (ZYRTEC) 10 MG tablet Take 10 mg by mouth daily as needed for allergies.    [provider]  dapagliflozin propanediol (FARXIGA) 5 MG TABS tablet Take 5 mg by mouth daily. 04/24/17   Varney Biles, MD  loperamide (IMODIUM A-D) 2 MG tablet Take 2 mg by mouth 4 (four) times daily as needed for diarrhea or loose stools.    [provider]  nitrofurantoin, macrocrystal-monohydrate, (MACROBID) 100 MG capsule Take 1 capsule (100 mg total) by mouth 2 (two) times daily. 08/20/16   Mesner, Corene Cornea, MD  promethazine (PHENERGAN) 25 MG suppository Place 1 suppository (25 mg total) rectally every 6 (six) hours as needed for nausea or  vomiting. 08/20/16   Mesner, Corene Cornea, MD  promethazine (PHENERGAN) 25 MG tablet Take 1 tablet (25 mg total) by mouth every 6 (six) hours as needed for nausea or vomiting. 08/20/16   Mesner, Corene Cornea, MD  traZODone (DESYREL) 50 MG tablet Take 1 tablet (50 mg total) by mouth at bedtime as needed for sleep. 09/26/15   Joretta Bachelor, PA    Family History Family History  Problem Relation Age of Onset  . Arthritis Mother   . Asthma Mother   . COPD Mother   . Depression Mother   . Diabetes Mother   . Hypertension Mother   . Mental illness Mother   . Kidney disease Mother   . Crohn's disease Mother   . Aneurysm Father 6       brain  .  Mental illness Sister   . Arthritis Maternal Aunt   . Asthma Maternal Aunt   . COPD Maternal Aunt   . Depression Maternal Aunt   . Diabetes Maternal Aunt   . Hypertension Maternal Aunt   . Mental illness Maternal Aunt   . Stroke Maternal Aunt   . Heart failure Maternal Aunt   . Heart failure Maternal Grandfather   . Cancer Maternal Grandfather   . Diabetes Maternal Grandfather   . Heart disease Maternal Grandfather   . Hypertension Maternal Grandfather   . Hyperlipidemia Maternal Grandfather   . Mental illness Brother   . Diabetes Maternal Grandmother   . Heart disease Maternal Grandmother   . Hypertension Maternal Grandmother   . Hyperlipidemia Maternal Grandmother   . Diabetes Paternal Grandmother   . Heart disease Paternal Grandmother   . Diabetes Paternal Grandfather   . Cancer Paternal Grandfather     Social History Social History   Tobacco Use  . Smoking status: Never Smoker  . Smokeless tobacco: Never Used  Substance Use Topics  . Alcohol use: Yes    Comment: occasional  . Drug use: No     Allergies   Bee venom and Penicillins   Review of Systems Review of Systems  Constitutional: Positive for appetite change, chills and fever.  HENT: Positive for congestion, ear pain, rhinorrhea and sore throat.   Respiratory: Positive for cough. Negative for chest tightness and shortness of breath.   Cardiovascular: Negative for chest pain, palpitations and leg swelling.  Gastrointestinal: Positive for nausea. Negative for abdominal pain, diarrhea and vomiting.  Genitourinary: Negative for dysuria, flank pain, pelvic pain, vaginal bleeding, vaginal discharge and vaginal pain.  Musculoskeletal: Negative for arthralgias, myalgias, neck pain and neck stiffness.  Skin: Negative for rash.  Neurological: Positive for headaches. Negative for dizziness and weakness.  All other systems reviewed and are negative.    Physical Exam Updated Vital Signs BP (!) 151/98 (BP  Location: Left Arm)   Pulse (!) 122   Temp 98.5 F (36.9 C) (Oral)   Resp (!) 24   Ht 5\' 2"  (1.575 m)   Wt 97.5 kg (215 lb)   LMP 05/30/2017 (Approximate)   SpO2 95%   BMI 39.32 kg/m   Physical Exam  Constitutional: She is oriented to person, place, and time. She appears well-developed and well-nourished.  HENT:  Head: Normocephalic and atraumatic.  Right Ear: Tympanic membrane, external ear and ear canal normal.  Left Ear: Tympanic membrane, external ear and ear canal normal.  Nose: Mucosal edema and rhinorrhea present.  Mouth/Throat: Uvula is midline and mucous membranes are normal. Posterior oropharyngeal erythema present. No oropharyngeal exudate, posterior oropharyngeal edema or  tonsillar abscesses.  Eyes: Conjunctivae are normal.  Neck: Neck supple.  Cardiovascular: Regular rhythm, normal heart sounds and intact distal pulses.  tachycardic  Pulmonary/Chest: Effort normal. No respiratory distress. She has wheezes. She has no rales.  Abdominal: Soft. Bowel sounds are normal. She exhibits no distension. There is no tenderness. There is no rebound.  Musculoskeletal: Normal range of motion. She exhibits no edema.  Neurological: She is alert and oriented to person, place, and time.  Skin: Skin is warm and dry.  Psychiatric: She has a normal mood and affect. Her behavior is normal.  Nursing note and vitals reviewed.    ED Treatments / Results  Labs (all labs ordered are listed, but only abnormal results are displayed) Labs Reviewed  CBC WITH DIFFERENTIAL/PLATELET - Abnormal; Notable for the following components:      Result Value   WBC 16.4 (*)    Neutro Abs 12.4 (*)    Monocytes Absolute 1.7 (*)    All other components within normal limits  BASIC METABOLIC PANEL - Abnormal; Notable for the following components:   Sodium 130 (*)    Potassium 5.3 (*)    Chloride 94 (*)    CO2 21 (*)    Glucose, Bld 369 (*)    All other components within normal limits  URINALYSIS,  ROUTINE W REFLEX MICROSCOPIC - Abnormal; Notable for the following components:   APPearance HAZY (*)    Glucose, UA >=500 (*)    Hgb urine dipstick MODERATE (*)    Ketones, ur 20 (*)    Protein, ur 100 (*)    Bacteria, UA RARE (*)    Squamous Epithelial / LPF 0-5 (*)    All other components within normal limits  INFLUENZA PANEL BY PCR (TYPE A & B)  I-STAT BETA HCG BLOOD, ED ([facility], [facility], [facility] ONLY)    EKG  EKG Interpretation None       Radiology Dg Chest 2 View  Result Date: 06/16/2017 CLINICAL DATA:  Cough, congestion, fatigue and fever. EXAM: CHEST  2 VIEW COMPARISON:  06/13/2017 FINDINGS: The heart size and mediastinal contours are within normal limits. Lung volumes are low bilaterally. There is no evidence of pulmonary edema, consolidation, pneumothorax, nodule or pleural fluid. The visualized skeletal structures are unremarkable. IMPRESSION: No active cardiopulmonary disease. Electronically Signed   By: Aletta Edouard M.D.   On: 06/16/2017 13:32    Procedures Procedures (including critical care time)  Medications Ordered in ED Medications  sodium chloride 0.9 % bolus 1,000 mL (not administered)  sodium chloride 0.9 % bolus 1,000 mL (not administered)  ondansetron (ZOFRAN) injection 4 mg (not administered)  ketorolac (TORADOL) 30 MG/ML injection 30 mg (not administered)  acetaminophen (TYLENOL) tablet 650 mg (not administered)  ipratropium-albuterol (DUONEB) 0.5-2.5 (3) MG/3ML nebulizer solution 3 mL (not administered)     Initial Impression / Assessment and Plan / ED Course  I have reviewed the triage vital signs and the nursing notes.  Pertinent labs & imaging results that were available during my care of the patient were reviewed by me and considered in my medical decision making (see chart for details).     Patient in emergency department with flulike symptoms.  Cough congestion for 2 weeks, severe symptoms for 4 days, out of the window for Tamiflu.  Patient is very  tearful, complaining of pain all over and not being able to breathe.  Her chest x-ray is negative.  She has not taken any Tylenol Motrin for her body aches today.  I will order her Toradol and Tylenol.  She is wheezing, which could explain why she feels like she cannot breathe.  I will give her a breathing treatment.  IV fluids ordered.  Antiemetics ordered.  Will send influenza panel and basic labs since patient is diabetic.  3:22 PM Patient rechecked.  So far received 1.5 L bolus, she feels slightly better, heart rate still in 120s.  She still wheezy.  Will order another breathing treatment and continue IV hydration.  Patient's influenza panel is negative.  Her glucose today came back at 369.  Bicarb was 21.  Sodium 130, if corrected to her glucose is normal.  Potassium 5.3.  Anion gap 15.  No signs of DKA.  We will continue to hydrate and will recheck after all 3 L of fluids or and   6:33 PM Patient continues to be tachycardic but states she feels much better.  She was able to drink fluids in emergency department without vomiting.  Her heart rate is currently around 110.  I looked through her records, history of visits in the past with elevated heart rate.  I discussed this with Dr. Marcha Dutton who wanted pt to be observed a little longer to see if hr would improve after albuterol treatment. Pt's HR currently on monitor between 100-120.  I discussed patient's vital signs with her, and she is aware.  She would still like to be discharged home.  I discussed with her close follow-up with her family doctor, she may benefit from a thyroid panel, and close recheck.  Patient agreed to call her doctor tomorrow.  Will discharge home with Phenergan, Tessalon, inhaler.  I will have her follow-up closely.  Return precautions discussed.  Vitals:   06/16/17 1704 06/16/17 1730 06/16/17 1830 06/16/17 1905  BP: 118/66 121/74 114/77 113/70  Pulse: (!) 119 (!) 113 (!) 118 (!) 125  Resp: 18  16 18   Temp:      TempSrc:        SpO2: 98% 93% 97% 93%  Weight:      Height:         Final Clinical Impressions(s) / ED Diagnoses   Final diagnoses:  Flu-like symptoms  Hyperglycemia  Tachycardia  Wheezing    ED Discharge Orders        Ordered    benzonatate (TESSALON) 100 MG capsule  Every 8 hours     06/16/17 1837    promethazine (PHENERGAN) 25 MG tablet  Every 6 hours PRN     06/16/17 1837       Jeannett Senior, PA-C 06/16/17 2231    Pixie Casino, MD 06/19/17 1206

## 2017-06-17 MED FILL — PROMETHAZINE 25 MG TABLET: 25 | 3 days supply | Qty: 10 | Fill #0

## 2017-06-17 MED FILL — BENZONATATE 100 MG CAPS: 100 | 7 days supply | Qty: 21 | Fill #0

## 2017-06-24 ENCOUNTER — Telehealth: Payer: Self-pay | Admitting: Family Medicine

## 2017-06-24 NOTE — Telephone Encounter (Signed)
Rcd doc to set up as new UMR pt.  Called [place] reached vm, lmtrc.

## 2017-06-25 ENCOUNTER — Telehealth: Payer: Self-pay | Admitting: Physician Assistant

## 2017-06-25 NOTE — Telephone Encounter (Signed)
Called and spoke with pt to confirm apt tomorrow 06/26/17. Advised of time, building # and time policies. °

## 2017-06-26 ENCOUNTER — Telehealth: Payer: Self-pay | Admitting: *Deleted

## 2017-06-26 ENCOUNTER — Ambulatory Visit (INDEPENDENT_AMBULATORY_CARE_PROVIDER_SITE_OTHER): Payer: 59 | Admitting: Physician Assistant

## 2017-06-26 ENCOUNTER — Ambulatory Visit (INDEPENDENT_AMBULATORY_CARE_PROVIDER_SITE_OTHER): Payer: 59

## 2017-06-26 ENCOUNTER — Other Ambulatory Visit: Payer: Self-pay

## 2017-06-26 ENCOUNTER — Encounter: Payer: Self-pay | Admitting: Physician Assistant

## 2017-06-26 VITALS — BP 130/90 | HR 125 | Temp 98.4°F | Resp 20 | Ht 63.5 in | Wt 253.8 lb

## 2017-06-26 DIAGNOSIS — N3000 Acute cystitis without hematuria: Secondary | ICD-10-CM

## 2017-06-26 DIAGNOSIS — R058 Other specified cough: Secondary | ICD-10-CM

## 2017-06-26 DIAGNOSIS — R0600 Dyspnea, unspecified: Secondary | ICD-10-CM

## 2017-06-26 DIAGNOSIS — J22 Unspecified acute lower respiratory infection: Secondary | ICD-10-CM | POA: Diagnosis not present

## 2017-06-26 DIAGNOSIS — E1165 Type 2 diabetes mellitus with hyperglycemia: Secondary | ICD-10-CM | POA: Diagnosis not present

## 2017-06-26 DIAGNOSIS — R05 Cough: Secondary | ICD-10-CM

## 2017-06-26 LAB — POCT CBC
GRANULOCYTE PERCENT: 63.2 % (ref 37–80)
HEMATOCRIT: 41.6 % (ref 37.7–47.9)
HEMOGLOBIN: 14.2 g/dL (ref 12.2–16.2)
Lymph, poc: 2.6 (ref 0.6–3.4)
MCH, POC: 29.1 pg (ref 27–31.2)
MCHC: 34.1 g/dL (ref 31.8–35.4)
MCV: 85.4 fL (ref 80–97)
MID (cbc): 0.6 (ref 0–0.9)
MPV: 8.8 fL (ref 0–99.8)
POC GRANULOCYTE: 5.4 (ref 2–6.9)
POC LYMPH %: 30.1 % (ref 10–50)
POC MID %: 6.7 % (ref 0–12)
Platelet Count, POC: 420 10*3/uL (ref 142–424)
RBC: 4.87 M/uL (ref 4.04–5.48)
RDW, POC: 13 %
WBC: 8.6 10*3/uL (ref 4.6–10.2)

## 2017-06-26 LAB — POCT URINALYSIS DIP (MANUAL ENTRY)
BILIRUBIN UA: NEGATIVE
Glucose, UA: 500 mg/dL — AB
Nitrite, UA: POSITIVE — AB
Protein Ur, POC: >=300 mg/dL — AB
Spec Grav, UA: 1.025 (ref 1.010–1.025)
Urobilinogen, UA: 0.2 E.U./dL
pH, UA: 6.5 (ref 5.0–8.0)

## 2017-06-26 LAB — GLUCOSE, POCT (MANUAL RESULT ENTRY): POC Glucose: 352 mg/dl — AB (ref 70–99)

## 2017-06-26 LAB — POCT GLYCOSYLATED HEMOGLOBIN (HGB A1C)

## 2017-06-26 MED ORDER — AZITHROMYCIN 250 MG PO TABS: ORAL_TABLET | ORAL | 0 refills | Status: DC

## 2017-06-26 MED ORDER — IPRATROPIUM BROMIDE 0.03 % NA SOLN: 2.0000 | Freq: Two times a day (BID) | NASAL | 0 refills | Status: DC

## 2017-06-26 MED ORDER — ALBUTEROL SULFATE (2.5 MG/3ML) 0.083% IN NEBU
2.5000 mg | INHALATION_SOLUTION | Freq: Once | RESPIRATORY_TRACT | Status: AC
  Administered 2017-06-26: 2.5 mg via RESPIRATORY_TRACT

## 2017-06-26 MED ORDER — GUAIFENESIN ER 1200 MG PO TB12: 1.0000 | ORAL_TABLET | Freq: Two times a day (BID) | ORAL | 1 refills | Status: DC | PRN

## 2017-06-26 MED ORDER — BLOOD GLUCOSE MONITORING SUPPL KIT: 1.0000 | PACK | 0 refills | Status: DC

## 2017-06-26 MED ORDER — SITAGLIPTIN PHOSPHATE 100 MG PO TABS: 100.0000 mg | ORAL_TABLET | Freq: Every day | ORAL | 2 refills | Status: DC

## 2017-06-26 MED ORDER — LEVOFLOXACIN 750 MG PO TABS
750.0000 mg | ORAL_TABLET | Freq: Every day | ORAL | 0 refills | Status: DC
Start: 2017-06-26 — End: 2017-08-13

## 2017-06-26 MED ORDER — IPRATROPIUM BROMIDE 0.02 % IN SOLN
0.5000 mg | Freq: Once | RESPIRATORY_TRACT | Status: AC
Start: 2017-06-26 — End: 2017-06-26
  Administered 2017-06-26: 0.5 mg via RESPIRATORY_TRACT

## 2017-06-26 MED ORDER — METFORMIN HCL ER 500 MG PO TB24: 500.0000 mg | ORAL_TABLET | Freq: Every day | ORAL | 1 refills | Status: DC

## 2017-06-26 MED FILL — FREESTYLE LITE METER: 30 days supply | Qty: 1 | Fill #0

## 2017-06-26 MED FILL — IPRATROPIUM 0.03% SPRAY: 0.03 | 30 days supply | Qty: 30 | Fill #0

## 2017-06-26 MED FILL — FREESTYLE LANCETS: 90 days supply | Qty: 100 | Fill #0

## 2017-06-26 MED FILL — JANUVIA 100 MG TABLET: 100 | 30 days supply | Qty: 30 | Fill #0

## 2017-06-26 MED FILL — FREESTYLE LITE TEST STRIP: 90 days supply | Qty: 100 | Fill #0

## 2017-06-26 MED FILL — levoFLOXacin 750 MG TABS: 750 | 5 days supply | Qty: 5 | Fill #0

## 2017-06-26 MED FILL — METFORMIN HCL ER 500 MG TAB: 500 | 30 days supply | Qty: 30 | Fill #0

## 2017-06-26 NOTE — Patient Instructions (Addendum)
You must push hydration with 64 oz of WATER if not more.  I am starting you on levaquin for a urinary tract infection.  Please follow up in 2 weeks.  I want to recheck your urine, and glucose.  Please use the inhaler every 6 hours for the next 24 hours.    Please check your glucose in the morning.  I would like you to also check your glucose if you are feeling dizzy, nauseous, trouble breathing.  If your glucose is under 90, please do not take the medication.    Sitagliptin oral tablet What is this medicine? SITAGLIPTIN (sit a GLIP tin) helps to treat type 2 diabetes. It helps to control blood sugar. Treatment is combined with diet and exercise. This medicine may be used for other purposes; ask your health care provider or pharmacist if you have questions. COMMON BRAND NAME(S): Januvia What should I tell my health care provider before I take this medicine? They need to know if you have any of these conditions: -diabetic ketoacidosis -kidney disease -pancreatitis -previous swelling of the tongue, face, or lips with difficulty breathing, difficulty swallowing, hoarseness, or tightening of the throat -type 1 diabetes -an unusual or allergic reaction to sitagliptin, other medicines, foods, dyes, or preservatives -pregnant or trying to get pregnant -breast-feeding How should I use this medicine? Take this medicine by mouth with a glass of water. Follow the directions on the prescription label. You can take it with or without food. Do not cut, crush or chew this medicine. Take your dose at the same time each day. Do not take more often than directed. Do not stop taking except on your doctor's advice. A special MedGuide will be given to you by the pharmacist with each prescription and refill. Be sure to read this information carefully each time. Talk to your pediatrician regarding the use of this medicine in children. Special care may be needed. Overdosage: If you think you have taken too much  of this medicine contact a poison control center or emergency room at once. NOTE: This medicine is only for you. Do not share this medicine with others. What if I miss a dose? If you miss a dose, take it as soon as you can. If it is almost time for your next dose, take only that dose. Do not take double or extra doses. What may interact with this medicine? Do not take this medicine with any of the following medications: -gatifloxacin This medicine may also interact with the following medications: -alcohol -digoxin -insulin -sulfonylureas like glimepiride, glipizide, glyburide This list may not describe all possible interactions. Give your health care provider a list of all the medicines, herbs, non-prescription drugs, or dietary supplements you use. Also tell them if you smoke, drink alcohol, or use illegal drugs. Some items may interact with your medicine. What should I watch for while using this medicine? Visit your doctor or health care professional for regular checks on your progress. A test called the HbA1C (A1C) will be monitored. This is a simple blood test. It measures your blood sugar control over the last 2 to 3 months. You will receive this test every 3 to 6 months. Learn how to check your blood sugar. Learn the symptoms of low and high blood sugar and how to manage them. Always carry a quick-source of sugar with you in case you have symptoms of low blood sugar. Examples include hard sugar candy or glucose tablets. Make sure others know that you can choke if you  eat or drink when you develop serious symptoms of low blood sugar, such as seizures or unconsciousness. They must get medical help at once. Tell your doctor or health care professional if you have high blood sugar. You might need to change the dose of your medicine. If you are sick or exercising more than usual, you might need to change the dose of your medicine. Do not skip meals. Ask your doctor or health care professional if  you should avoid alcohol. Many nonprescription cough and cold products contain sugar or alcohol. These can affect blood sugar. Wear a medical ID bracelet or chain, and carry a card that describes your disease and details of your medicine and dosage times. What side effects may I notice from receiving this medicine? Side effects that you should report to your doctor or health care professional as soon as possible: -allergic reactions like skin rash, itching or hives, swelling of the face, lips, or tongue -breathing problems -general ill feeling or flu-like symptoms -joint pain -loss of appetite -redness, blistering, peeling or loosening of the skin, including inside the mouth -signs and symptoms of low blood sugar such as feeling anxious, confusion, dizziness, increased hunger, unusually weak or tired, sweating, shakiness, cold, irritable, headache, blurred vision, fast heartbeat, loss of consciousness -unusual stomach pain or discomfort -vomiting Side effects that usually do not require medical attention (report to your doctor or health care professional if they continue or are bothersome): -diarrhea -headache -sore throat -stomach upset -stuffy or runny nose This list may not describe all possible side effects. Call your doctor for medical advice about side effects. You may report side effects to FDA at 1-800-FDA-1088. Where should I keep my medicine? Keep out of the reach of children. Store at room temperature between 15 and 30 degrees C (59 and 86 degrees F). Throw away any unused medicine after the expiration date. NOTE: This sheet is a summary. It may not cover all possible information. If you have questions about this medicine, talk to your doctor, pharmacist, or health care provider.  2018 Elsevier/Gold Standard (2015-10-21 14:23:55)    IF you received an x-ray today, you will receive an invoice from [facility]. Please contact [facility] at [phone] with  questions or concerns regarding your invoice.   IF you received labwork today, you will receive an invoice from [facility]. Please contact [facility] at [phone] with questions or concerns regarding your invoice.   Our billing staff will not be able to assist you with questions regarding bills from these companies.  You will be contacted with the lab results as soon as they are available. The fastest way to get your results is to activate your My Chart account. Instructions are located on the last page of this paperwork. If you have not heard from [place] regarding the results in 2 weeks, please contact this office.

## 2017-06-26 NOTE — Progress Notes (Signed)
PRIMARY CARE AT [place], [place] [address] [phone]  Date:  06/26/2017   Name:  Sheila Austin   DOB:  [birth year]   MRN:  [id number]  PCP:  Joretta Bachelor, PA    History of Present Illness:  Sheila Austin is a 32 y.o. female patient who presents to PCP with  Chief Complaint  Patient presents with  . Cough    "dry heaves", symptoms going on for 3 wks, per pt tested on 06/16/17 at [facility] ED for the flu, test negative  . Sore Throat  . bodyaches  . Headache     Patient has coughing for 3 weeks.  Sore throat.  body aches and headache.  She was seen in the ED twice.  Sore throat, headaches.  Left ear feels stopped up and more pain there and along her face.  They gave her tessalon Perles.  The albuterol helps temporary with deep breaths.  She denies fever for 1 week.  Nebulizing treatment helped.  She is drinking over 1 gallon of water per day.   Patient has a hx of diabetes.  She reports that she has not taken any diabetic medication in over a year.  She was given farxiga 04/2017, less than 2 months ago... I had advised follow up 2 years ago of which she never returned regarding her diabetes.      Wt Readings from Last 3 Encounters:  06/26/17 253 lb 12.8 oz (115.1 kg)  06/16/17 215 lb (97.5 kg)  01/28/16 253 lb (114.8 kg)     Patient Active Problem List   Diagnosis Date Noted  . Abscess of right breast 02/24/2016  . BMI 45.0-49.9, adult ([place]) 09/29/2015  . Skin lesions - right lower extremity 12/07/2013  . Morbid obesity with BMI of 50.0-59.9, adult ([place]) 10/05/2011  . Obstructive sleep apnea 10/05/2011  . Hypertriglyceridemia 09/27/2011  . Migraine 09/07/2011  . Essential hypertension, benign 11/01/2008  . Gastroparesis 09/27/2008  . IRREGULAR MENSES 08/24/2008  . Diabetes mellitus out of control ([place]) 07/04/2006    Past Medical History:  Diagnosis Date  . Diabetes mellitus   . E. coli sepsis ([place])   . Ectopic pregnancy   . Essential  hypertension, benign 11/01/2008  . Fatty liver   . Gastroparesis   . GERD (gastroesophageal reflux disease)   . H. pylori infection   . Helicobacter pylori gastritis 09/07/2011  . Migraine   . Morbid obesity with BMI of 50.0-59.9, adult ([place]) 10/05/2011  . Pulmonary edema   . Pyelonephritis   . Ureteral obstruction     Past Surgical History:  Procedure Laterality Date  . ESOPHAGOGASTRODUODENOSCOPY  2010    Social History   Tobacco Use  . Smoking status: Never Smoker  . Smokeless tobacco: Never Used  Substance Use Topics  . Alcohol use: Yes    Comment: occasional  . Drug use: No    Family History  Problem Relation Age of Onset  . Arthritis Mother   . Asthma Mother   . COPD Mother   . Depression Mother   . Diabetes Mother   . Hypertension Mother   . Mental illness Mother   . Kidney disease Mother   . Crohn's disease Mother   . Aneurysm Father 41       brain  . Mental illness Sister   . Arthritis Maternal Aunt   . Asthma Maternal Aunt   . COPD Maternal Aunt   . Depression Maternal Aunt   .  Diabetes Maternal Aunt   . Hypertension Maternal Aunt   . Mental illness Maternal Aunt   . Stroke Maternal Aunt   . Heart failure Maternal Aunt   . Heart failure Maternal Grandfather   . Cancer Maternal Grandfather   . Diabetes Maternal Grandfather   . Heart disease Maternal Grandfather   . Hypertension Maternal Grandfather   . Hyperlipidemia Maternal Grandfather   . Mental illness Brother   . Diabetes Maternal Grandmother   . Heart disease Maternal Grandmother   . Hypertension Maternal Grandmother   . Hyperlipidemia Maternal Grandmother   . Diabetes Paternal Grandmother   . Heart disease Paternal Grandmother   . Diabetes Paternal Grandfather   . Cancer Paternal Grandfather     Allergies  Allergen Reactions  . Bee Venom Anaphylaxis, Hives, Shortness Of Breath and Swelling  . Penicillins Anaphylaxis    Has patient had a PCN reaction causing immediate rash,  facial/tongue/throat swelling, SOB or lightheadedness with hypotension: yes Has patient had a PCN reaction causing severe rash involving mucus membranes or skin necrosis: unknown Has patient had a PCN reaction that required hospitalization : yes Has patient had a PCN reaction occurring within the last 10 years: no If all of the above answers are "NO", then may proceed with Cephalosporin use.     Medication list has been reviewed and updated.  Current Outpatient Medications on File Prior to Visit  Medication Sig Dispense Refill  . loperamide (IMODIUM A-D) 2 MG tablet Take 2 mg by mouth 4 (four) times daily as needed for diarrhea or loose stools.    . promethazine (PHENERGAN) 25 MG tablet Take 1 tablet (25 mg total) by mouth every 6 (six) hours as needed for nausea or vomiting. 10 tablet 0  . cetirizine (ZYRTEC) 10 MG tablet Take 10 mg by mouth daily as needed for allergies.    . dapagliflozin propanediol (FARXIGA) 5 MG TABS tablet Take 5 mg by mouth daily. (Patient not taking: Reported on 06/26/2017) 30 tablet 1  . traZODone (DESYREL) 50 MG tablet Take 1 tablet (50 mg total) by mouth at bedtime as needed for sleep. (Patient not taking: Reported on 06/26/2017) 30 tablet 0   No current facility-administered medications on file prior to visit.     ROS ROS otherwise unremarkable unless listed above.  Physical Examination: BP 130/90   Pulse (!) 125   Temp 98.4 F (36.9 C) (Oral)   Resp 20   Ht 5' 3.5" (1.613 m)   Wt 253 lb 12.8 oz (115.1 kg)   LMP 06/21/2017   SpO2 95%   BMI 44.25 kg/m  Ideal Body Weight: Weight in (lb) to have BMI = 25: 143.1  Physical Exam  Constitutional: She is oriented to person, place, and time. She appears well-developed and well-nourished. No distress.  HENT:  Head: Normocephalic and atraumatic.  Right Ear: Tympanic membrane, external ear and ear canal normal.  Left Ear: Tympanic membrane, external ear and ear canal normal.  Nose: Mucosal edema and  rhinorrhea present. Right sinus exhibits no maxillary sinus tenderness and no frontal sinus tenderness. Left sinus exhibits no maxillary sinus tenderness and no frontal sinus tenderness.  Mouth/Throat: No uvula swelling. No oropharyngeal exudate, posterior oropharyngeal edema or posterior oropharyngeal erythema.  Eyes: Conjunctivae and EOM are normal. Pupils are equal, round, and reactive to light.  Cardiovascular: Normal rate and regular rhythm. Exam reveals no gallop, no distant heart sounds and no friction rub.  No murmur heard. Pulmonary/Chest: Effort normal. No respiratory distress. She  has decreased breath sounds. She has wheezes. She has no rhonchi.  Lymphadenopathy:       Head (right side): No submandibular, no tonsillar, no preauricular and no posterior auricular adenopathy present.       Head (left side): No submandibular, no tonsillar, no preauricular and no posterior auricular adenopathy present.  Neurological: She is alert and oriented to person, place, and time.  Skin: She is not diaphoretic.  Psychiatric: She has a normal mood and affect. Her behavior is normal.    Results for orders placed or performed in visit on 06/26/17  POCT glucose (manual entry)  Result Value Ref Range   POC Glucose 352 (A) 70 - 99 mg/dl  POCT CBC  Result Value Ref Range   WBC 8.6 4.6 - 10.2 K/uL   Lymph, poc 2.6 0.6 - 3.4   POC LYMPH PERCENT 30.1 10 - 50 %L   MID (cbc) 0.6 0 - 0.9   POC MID % 6.7 0 - 12 %M   POC Granulocyte 5.4 2 - 6.9   Granulocyte percent 63.2 37 - 80 %G   RBC 4.87 4.04 - 5.48 M/uL   Hemoglobin 14.2 12.2 - 16.2 g/dL   HCT, POC 41.6 37.7 - 47.9 %   MCV 85.4 80 - 97 fL   MCH, POC 29.1 27 - 31.2 pg   MCHC 34.1 31.8 - 35.4 g/dL   RDW, POC 13.0 %   Platelet Count, POC 420 142 - 424 K/uL   MPV 8.8 0 - 99.8 fL   Dg Chest 2 View  Result Date: 06/26/2017 CLINICAL DATA:  Cough and congestion EXAM: CHEST  2 VIEW COMPARISON:  June 16, 2017 FINDINGS: Lungs are clear. Heart size  and pulmonary vascularity are normal. No adenopathy. No bone lesions. IMPRESSION: No edema or consolidation. Electronically Signed   By: Lowella Grip III M.D.   On: 06/26/2017 14:42   Dg Chest 2 View  Result Date: 06/16/2017 CLINICAL DATA:  Cough, congestion, fatigue and fever. EXAM: CHEST  2 VIEW COMPARISON:  06/13/2017 FINDINGS: The heart size and mediastinal contours are within normal limits. Lung volumes are low bilaterally. There is no evidence of pulmonary edema, consolidation, pneumothorax, nodule or pleural fluid. The visualized skeletal structures are unremarkable. IMPRESSION: No active cardiopulmonary disease. Electronically Signed   By: Aletta Edouard M.D.   On: 06/16/2017 13:32   Dg Chest 2 View  Result Date: 06/13/2017 CLINICAL DATA:  Acute onset of productive cough and fever. EXAM: CHEST  2 VIEW COMPARISON:  Chest radiograph performed 09/11/2011 FINDINGS: The lungs are well-aerated and clear. There is no evidence of focal opacification, pleural effusion or pneumothorax. The heart is normal in size; the mediastinal contour is within normal limits. No acute osseous abnormalities are seen. IMPRESSION: No acute cardiopulmonary process seen. Electronically Signed   By: Garald Balding M.D.   On: 06/13/2017 23:23      Assessment and Plan: ARIELLA VOIT is a 32 y.o. female who is here today for cc of  Chief Complaint  Patient presents with  . Cough    "dry heaves", symptoms going on for 3 wks, per pt tested on 06/16/17 at [facility] ED for the flu, test negative  . Sore Throat  . bodyaches  . Headache   Treating lower respiratory infection and urinary infection with Levaquin. Advised too restart her diabetes medication.  We will do the metformin and januvia at this time.  XR due to gastroparesis.   Advised to use the inhaler  every 6 hours for the next 24 hours.  We can not do prednisone at this time d/t dm uncontrolled  Alarming symptoms to warrant an immediate return were given.    Advised to follow up in one week.   Uncontrolled type 2 diabetes mellitus with hyperglycemia (HCC) - Plan: sitaGLIPtin (JANUVIA) 100 MG tablet, metFORMIN (GLUCOPHAGE-XR) 500 MG 24 hr tablet  Lower respiratory infection (e.g., bronchitis, pneumonia, pneumonitis, pulmonitis)  Acute cystitis without hematuria - Plan: levofloxacin (LEVAQUIN) 750 MG tablet  Productive cough - Plan: DG Chest 2 View, POCT CBC, POCT glycosylated hemoglobin (Hb A1C), CANCELED: CBC  Dyspnea, unspecified type - Plan: DG Chest 2 View, POCT urinalysis dipstick, POCT glucose (manual entry), POCT CBC, POCT glycosylated hemoglobin (Hb A1C), albuterol (PROVENTIL) (2.5 MG/3ML) 0.083% nebulizer solution 2.5 mg, ipratropium (ATROVENT) nebulizer solution 0.5 mg  Sheila Drape, PA-C [facility] 2/20/20193:24 PM

## 2017-06-26 NOTE — Telephone Encounter (Signed)
Cancelled Z-pak at [employer] [facility], per [place], [place]

## 2017-07-09 ENCOUNTER — Encounter: Payer: Self-pay | Admitting: Physician Assistant

## 2017-07-09 ENCOUNTER — Other Ambulatory Visit: Payer: Self-pay

## 2017-07-09 ENCOUNTER — Ambulatory Visit: Payer: 59 | Admitting: Physician Assistant

## 2017-07-09 VITALS — BP 124/82 | HR 96 | Temp 98.2°F | Resp 18 | Ht 63.58 in | Wt 246.0 lb

## 2017-07-09 DIAGNOSIS — Z6841 Body Mass Index (BMI) 40.0 and over, adult: Secondary | ICD-10-CM | POA: Diagnosis not present

## 2017-07-09 DIAGNOSIS — E1165 Type 2 diabetes mellitus with hyperglycemia: Secondary | ICD-10-CM

## 2017-07-09 DIAGNOSIS — E66813 Obesity, class 3: Secondary | ICD-10-CM | POA: Insufficient documentation

## 2017-07-09 LAB — POCT URINALYSIS DIP (MANUAL ENTRY)
Bilirubin, UA: NEGATIVE
Glucose, UA: 250 mg/dL — AB
Ketones, POC UA: NEGATIVE mg/dL
Leukocytes, UA: NEGATIVE
Nitrite, UA: NEGATIVE
Protein Ur, POC: 100 mg/dL — AB
Spec Grav, UA: 1.025 (ref 1.010–1.025)
Urobilinogen, UA: 0.2 U/dL
pH, UA: 5.5 (ref 5.0–8.0)

## 2017-07-09 LAB — GLUCOSE, POCT (MANUAL RESULT ENTRY): POC Glucose: 232 mg/dl — AB (ref 70–99)

## 2017-07-09 LAB — POCT GLYCOSYLATED HEMOGLOBIN (HGB A1C): Hemoglobin A1C: 13.5

## 2017-07-09 NOTE — Progress Notes (Signed)
Sheila Austin  MRN: [id number] DOB: 1986/01/29  PCP: Joretta Bachelor, PA  Subjective:  Pt is a pleasant 32 year old female PMH HTN, DM who presents to clinic for DM check.  She was here 06/26/2017 for cough and worked up for uncontrolled DM with [employer]. Rx for Metformin and Januvia. Advised to hydrate.  HPI note from that OV: "Patient has a hx of diabetes.  She reports that she has not taken any diabetic medication in over a year.  She was given farxiga 04/2017, less than 2 months ago... I had advised follow up 2 years ago of which she never returned regarding her diabetes."      She is checking blood sugars at home: 250-300, which is an improvement (per pt).  Walking about 1.31m four days/week.  Diet: She is trying to reduce portion sizes, cutting back on soda intake (used to drink 42 oz/day, now 12oz/day).  She is feeling well today ROS below.   Review of Systems  Constitutional: Negative for appetite change, diaphoresis and fatigue.  Gastrointestinal: Negative for abdominal pain, nausea and vomiting.  Endocrine: Negative for polydipsia, polyphagia and polyuria.  Neurological: Negative for dizziness and headaches.    Patient Active Problem List   Diagnosis Date Noted  . Abscess of right breast 02/24/2016  . BMI 45.0-49.9, adult ([place] 09/29/2015  . Skin lesions - right lower extremity 12/07/2013  . Morbid obesity with BMI of 50.0-59.9, adult ([place] 10/05/2011  . Obstructive sleep apnea 10/05/2011  . Hypertriglyceridemia 09/27/2011  . Migraine 09/07/2011  . Essential hypertension, benign 11/01/2008  . Gastroparesis 09/27/2008  . IRREGULAR MENSES 08/24/2008  . Diabetes mellitus out of control ([place] 07/04/2006    Current Outpatient Medications on File Prior to Visit  Medication Sig Dispense Refill  . loperamide (IMODIUM A-D) 2 MG tablet Take 2 mg by mouth 4 (four) times daily as needed for diarrhea or loose stools.    . metFORMIN (GLUCOPHAGE-XR) 500 MG 24  hr tablet Take 1 tablet (500 mg total) by mouth daily with breakfast. 30 tablet 1  . sitaGLIPtin (JANUVIA) 100 MG tablet Take 1 tablet (100 mg total) by mouth daily. Start with half tablet daily first week.  Then increase the the full tablet. 30 tablet 2  . azithromycin (ZITHROMAX) 250 MG tablet Take 2 tabs PO x 1 dose, then 1 tab PO QD x 4 days (Patient not taking: Reported on 07/09/2017) 6 tablet 0  . Blood Glucose Monitoring Suppl KIT 1 kit by Does not apply route every morning. (Patient not taking: Reported on 07/09/2017) 1 each 0  . cetirizine (ZYRTEC) 10 MG tablet Take 10 mg by mouth daily as needed for allergies.    . dapagliflozin propanediol (FARXIGA) 5 MG TABS tablet Take 5 mg by mouth daily. (Patient not taking: Reported on 06/26/2017) 30 tablet 1  . Guaifenesin (MUCINEX MAXIMUM STRENGTH) 1200 MG TB12 Take 1 tablet (1,200 mg total) by mouth every 12 (twelve) hours as needed. (Patient not taking: Reported on 07/09/2017) 14 tablet 1  . ipratropium (ATROVENT) 0.03 % nasal spray Place 2 sprays into both nostrils 2 (two) times daily. (Patient not taking: Reported on 07/09/2017) 30 mL 0  . levofloxacin (LEVAQUIN) 750 MG tablet Take 1 tablet (750 mg total) by mouth daily. (Patient not taking: Reported on 07/09/2017) 5 tablet 0  . promethazine (PHENERGAN) 25 MG tablet Take 1 tablet (25 mg total) by mouth every 6 (six) hours as needed for nausea or vomiting. (Patient not taking:  Reported on 07/09/2017) 10 tablet 0  . traZODone (DESYREL) 50 MG tablet Take 1 tablet (50 mg total) by mouth at bedtime as needed for sleep. (Patient not taking: Reported on 06/26/2017) 30 tablet 0   No current facility-administered medications on file prior to visit.     Allergies  Allergen Reactions  . Bee Venom Anaphylaxis, Hives, Shortness Of Breath and Swelling  . Penicillins Anaphylaxis    Has patient had a PCN reaction causing immediate rash, facial/tongue/throat swelling, SOB or lightheadedness with hypotension: yes Has  patient had a PCN reaction causing severe rash involving mucus membranes or skin necrosis: unknown Has patient had a PCN reaction that required hospitalization : yes Has patient had a PCN reaction occurring within the last 10 years: no If all of the above answers are "NO", then may proceed with Cephalosporin use.      Objective:  BP 124/82 (BP Location: Left Arm, Patient Position: Sitting, Cuff Size: Normal)   Pulse 96   Temp 98.2 F (36.8 C) (Oral)   Resp 18   Ht 5' 3.58" (1.615 m)   Wt 246 lb (111.6 kg)   LMP 06/21/2017   SpO2 98%   BMI 42.78 kg/m  Diabetic Foot Exam - Simple   Simple Foot Form Visual Inspection No deformities, no ulcerations, no other skin breakdown bilaterally:  Yes See comments:  Yes Sensation Testing Intact to touch and monofilament testing bilaterally:  Yes Pulse Check Posterior Tibialis and Dorsalis pulse intact bilaterally:  Yes Comments Both feet Big toe nail lifted    Physical Exam  Constitutional: She is oriented to person, place, and time and well-developed, well-nourished, and in no distress. No distress.  Obese   Cardiovascular: Normal rate, regular rhythm and normal heart sounds.  Neurological: She is alert and oriented to person, place, and time. GCS score is 15.  Skin: Skin is warm and dry.  Psychiatric: Mood, memory, affect and judgment normal.  Vitals reviewed.   Results for orders placed or performed in visit on 07/09/17  POCT glucose (manual entry)  Result Value Ref Range   POC Glucose 232 (A) 70 - 99 mg/dl  POCT urinalysis dipstick  Result Value Ref Range   Color, UA yellow yellow   Clarity, UA cloudy (A) clear   Glucose, UA =250 (A) negative mg/dL   Bilirubin, UA negative negative   Ketones, POC UA negative negative mg/dL   Spec Grav, UA 1.025 1.010 - 1.025   Blood, UA small (A) negative   pH, UA 5.5 5.0 - 8.0   Protein Ur, POC =100 (A) negative mg/dL   Urobilinogen, UA 0.2 0.2 or 1.0 E.U./dL   Nitrite, UA Negative  Negative   Leukocytes, UA Negative Negative  POCT glycosylated hemoglobin (Hb A1C)  Result Value Ref Range   Hemoglobin A1C 13.5     Assessment and Plan :  1. Uncontrolled type 2 diabetes mellitus with hyperglycemia (HCC) 2. Class 3 severe obesity with body mass index (BMI) of 40.0 to 44.9 in adult, unspecified obesity type, unspecified whether serious comorbidity present (HCC) - POCT glucose (manual entry) - POCT urinalysis dipstick - POCT glycosylated hemoglobin (Hb A1C) - Microalbumin, urine - Pt presents for f/u DM. She was here last week with A1C >14, blood sugar 352, and ketonuria. Her labs have improved with medications, diet and exercise. POC glucose is 232, A1C is 13.5 and no ketones in her urine. Advised pt to con't Metformin and Januvia daily, and con't with improvements in her diet and  exercise. Discussed need for medication compliance and long term effects of DM on the body. RTC in 4 weeks for DM check. She understands and agrees.   Mercer Pod, PA-C  [facility] at [place] 07/09/2017 1:56 PM

## 2017-07-09 NOTE — Patient Instructions (Addendum)
Diabetes.org is a Microbiologist. Check it out!  Keep up the great work!! Your A1C is 13.5. I'd like to see this <8. You can do it! Continue taking your medications as directed. Adding on exercise and improvements to your diet will help a lot in bringing down your blood sugar.  Your A1C, blood sugar and urine are all improving!   Come back and see me in 4 weeks.   Diabetes Mellitus and Exercise Exercising regularly is important for your overall health, especially when you have diabetes (diabetes mellitus). Exercising is not only about losing weight. It has many health benefits, such as increasing muscle strength and bone density and reducing body fat and stress. This leads to improved fitness, flexibility, and endurance, all of which result in better overall health. Exercise has additional benefits for people with diabetes, including:  Reducing appetite.  Helping to lower and control blood glucose.  Lowering blood pressure.  Helping to control amounts of fatty substances (lipids) in the blood, such as cholesterol and triglycerides.  Helping the body to respond better to insulin (improving insulin sensitivity).  Reducing how much insulin the body needs.  Decreasing the risk for heart disease by: ? Lowering cholesterol and triglyceride levels. ? Increasing the levels of good cholesterol. ? Lowering blood glucose levels.  What is my activity plan? Your health care provider or certified diabetes educator can help you make a plan for the type and frequency of exercise (activity plan) that works for you. Make sure that you:  Do at least 150 minutes of moderate-intensity or vigorous-intensity exercise each week. This could be brisk walking, biking, or water aerobics. ? Do stretching and strength exercises, such as yoga or weightlifting, at least 2 times a week. ? Spread out your activity over at least 3 days of the week.  Get some form of physical activity every day. ? Do not go more  than 2 days in a row without some kind of physical activity. ? Avoid being inactive for more than 90 minutes at a time. Take frequent breaks to walk or stretch.  Choose a type of exercise or activity that you enjoy, and set realistic goals.  Start slowly, and gradually increase the intensity of your exercise over time.  What do I need to know about managing my diabetes?  Check your blood glucose before and after exercising. ? If your blood glucose is higher than 240 mg/dL (13.3 mmol/L) before you exercise, check your urine for ketones. If you have ketones in your urine, do not exercise until your blood glucose returns to normal.  Know the symptoms of low blood glucose (hypoglycemia) and how to treat it. Your risk for hypoglycemia increases during and after exercise. Common symptoms of hypoglycemia can include: ? Hunger. ? Anxiety. ? Sweating and feeling clammy. ? Confusion. ? Dizziness or feeling light-headed. ? Increased heart rate or palpitations. ? Blurry vision. ? Tingling or numbness around the mouth, lips, or tongue. ? Tremors or shakes. ? Irritability.  Keep a rapid-acting carbohydrate snack available before, during, and after exercise to help prevent or treat hypoglycemia.  Avoid injecting insulin into areas of the body that are going to be exercised. For example, avoid injecting insulin into: ? The arms, when playing tennis. ? The legs, when jogging.  Keep records of your exercise habits. Doing this can help you and your health care provider adjust your diabetes management plan as needed. Write down: ? Food that you eat before and after you exercise. ? Blood  glucose levels before and after you exercise. ? The type and amount of exercise you have done. ? When your insulin is expected to peak, if you use insulin. Avoid exercising at times when your insulin is peaking.  When you start a new exercise or activity, work with your health care provider to make sure the activity  is safe for you, and to adjust your insulin, medicines, or food intake as needed.  Drink plenty of water while you exercise to prevent dehydration or heat stroke. Drink enough fluid to keep your urine clear or pale yellow. This information is not intended to replace advice given to you by your health care provider. Make sure you discuss any questions you have with your health care provider. Document Released: 07/14/2003 Document Revised: 11/11/2015 Document Reviewed: 10/03/2015 Elsevier Interactive Patient Education  [employer].  Thank you for coming in today. I hope you feel we met your needs.  Feel free to call PCP if you have any questions or further requests.  Please consider signing up for MyChart if you do not already have it, as this is a great way to communicate with me.  Best,  Mercer Pod, PA-C]  IF you received an x-ray today, you will receive an invoice from [facility]. Please contact [facility] at [phone] with questions or concerns regarding your invoice.   IF you received labwork today, you will receive an invoice from [facility]. Please contact [facility] at [phone] with questions or concerns regarding your invoice.   Our billing staff will not be able to assist you with questions regarding bills from these companies.  You will be contacted with the lab results as soon as they are available. The fastest way to get your results is to activate your My Chart account. Instructions are located on the last page of this paperwork. If you have not heard from [place] regarding the results in 2 weeks, please contact this office.

## 2017-07-10 LAB — MICROALBUMIN, URINE: Microalbumin, Urine: 668.5 ug/mL

## 2017-07-24 MED FILL — JANUVIA 100 MG TABLET: 100 | 30 days supply | Qty: 30 | Fill #1

## 2017-07-24 MED FILL — METFORMIN HCL ER 500 MG TAB: 500 | 30 days supply | Qty: 30 | Fill #1

## 2017-08-06 ENCOUNTER — Ambulatory Visit: Payer: 59 | Admitting: Physician Assistant

## 2017-08-13 ENCOUNTER — Ambulatory Visit: Payer: 59 | Admitting: Physician Assistant

## 2017-08-13 ENCOUNTER — Encounter: Payer: Self-pay | Admitting: Physician Assistant

## 2017-08-13 VITALS — BP 138/90 | HR 111 | Temp 98.2°F | Resp 16 | Ht 63.0 in | Wt 260.0 lb

## 2017-08-13 DIAGNOSIS — Z6841 Body Mass Index (BMI) 40.0 and over, adult: Secondary | ICD-10-CM | POA: Diagnosis not present

## 2017-08-13 DIAGNOSIS — E1165 Type 2 diabetes mellitus with hyperglycemia: Secondary | ICD-10-CM | POA: Diagnosis not present

## 2017-08-13 DIAGNOSIS — Z202 Contact with and (suspected) exposure to infections with a predominantly sexual mode of transmission: Secondary | ICD-10-CM | POA: Diagnosis not present

## 2017-08-13 DIAGNOSIS — Z124 Encounter for screening for malignant neoplasm of cervix: Secondary | ICD-10-CM | POA: Diagnosis not present

## 2017-08-13 LAB — POCT URINALYSIS DIP (MANUAL ENTRY)
Bilirubin, UA: NEGATIVE
Glucose, UA: 250 mg/dL — AB
Ketones, POC UA: NEGATIVE mg/dL
Nitrite, UA: NEGATIVE
Protein Ur, POC: 100 mg/dL — AB
Spec Grav, UA: >=1.03 — AB (ref 1.010–1.025)
Urobilinogen, UA: 0.2 E.U./dL
pH, UA: 5.5 (ref 5.0–8.0)

## 2017-08-13 LAB — POCT WET + KOH PREP
Trich by wet prep: ABSENT
Yeast by KOH: ABSENT
Yeast by wet prep: ABSENT

## 2017-08-13 LAB — POCT GLYCOSYLATED HEMOGLOBIN (HGB A1C): Hemoglobin A1C: 12.1

## 2017-08-13 MED ORDER — METFORMIN HCL ER (MOD) 1000 MG PO TB24
1000.0000 mg | ORAL_TABLET | Freq: Two times a day (BID) | ORAL | 5 refills | Status: DC
Start: 2017-08-13 — End: 2018-07-02

## 2017-08-13 MED ORDER — SITAGLIPTIN PHOSPHATE 100 MG PO TABS: 100.0000 mg | ORAL_TABLET | Freq: Every day | ORAL | 2 refills | Status: DC

## 2017-08-13 NOTE — Patient Instructions (Addendum)
Your A1C today is 12.1. It's going in the right direction! We need to go up on your Metformin dose. To decrease any side effects, let's slowly increase the dose to 1,000mg  twice daily (see below). I sent in Rx for Metformin 1,000mg  - start this when you are out of 500mg  and are taking 1,000mg  twice daily.  Metformin Dosing (to be taken with food) Week 1: take 1 tablet in the morning, 1/2 tablet at night. Week 2: take 2 tablets twice a day (total of 1,000mg  twice daily).  Come back and see me in 2 months for diabetes check.   - You are negative for trichomonas. We will contact you with the rest of your results when they come back.   Diabetes Mellitus and Nutrition When you have diabetes (diabetes mellitus), it is very important to have healthy eating habits because your blood sugar (glucose) levels are greatly affected by what you eat and drink. Eating healthy foods in the appropriate amounts, at about the same times every day, can help you:  Control your blood glucose.  Lower your risk of heart disease.  Improve your blood pressure.  Reach or maintain a healthy weight.  Every person with diabetes is different, and each person has different needs for a meal plan. Your health care provider may recommend that you work with a diet and nutrition specialist (dietitian) to make a meal plan that is best for you. Your meal plan may vary depending on factors such as:  The calories you need.  The medicines you take.  Your weight.  Your blood glucose, blood pressure, and cholesterol levels.  Your activity level.  Other health conditions you have, such as heart or kidney disease.  How do carbohydrates affect me? Carbohydrates affect your blood glucose level more than any other type of food. Eating carbohydrates naturally increases the amount of glucose in your blood. Carbohydrate counting is a method for keeping track of how many carbohydrates you eat. Counting carbohydrates is important to  keep your blood glucose at a healthy level, especially if you use insulin or take certain oral diabetes medicines. It is important to know how many carbohydrates you can safely have in each meal. This is different for every person. Your dietitian can help you calculate how many carbohydrates you should have at each meal and for snack. Foods that contain carbohydrates include:  Bread, cereal, rice, pasta, and crackers.  Potatoes and corn.  Peas, beans, and lentils.  Milk and yogurt.  Fruit and juice.  Desserts, such as cakes, cookies, ice cream, and candy.  How does alcohol affect me? Alcohol can cause a sudden decrease in blood glucose (hypoglycemia), especially if you use insulin or take certain oral diabetes medicines. Hypoglycemia can be a life-threatening condition. Symptoms of hypoglycemia (sleepiness, dizziness, and confusion) are similar to symptoms of having too much alcohol. If your health care provider says that alcohol is safe for you, follow these guidelines:  Limit alcohol intake to no more than 1 drink per day for nonpregnant women and 2 drinks per day for men. One drink equals 12 oz of beer, 5 oz of wine, or 1 oz of hard liquor.  Do not drink on an empty stomach.  Keep yourself hydrated with water, diet soda, or unsweetened iced tea.  Keep in mind that regular soda, juice, and other mixers may contain a lot of sugar and must be counted as carbohydrates.  What are tips for following this plan? Reading food labels  Start by  checking the serving size on the label. The amount of calories, carbohydrates, fats, and other nutrients listed on the label are based on one serving of the food. Many foods contain more than one serving per package.  Check the total grams (g) of carbohydrates in one serving. You can calculate the number of servings of carbohydrates in one serving by dividing the total carbohydrates by 15. For example, if a food has 30 g of total carbohydrates, it  would be equal to 2 servings of carbohydrates.  Check the number of grams (g) of saturated and trans fats in one serving. Choose foods that have low or no amount of these fats.  Check the number of milligrams (mg) of sodium in one serving. Most people should limit total sodium intake to less than 2,300 mg per day.  Always check the nutrition information of foods labeled as "low-fat" or "nonfat". These foods may be higher in added sugar or refined carbohydrates and should be avoided.  Talk to your dietitian to identify your daily goals for nutrients listed on the label. Shopping  Avoid buying canned, premade, or processed foods. These foods tend to be high in fat, sodium, and added sugar.  Shop around the outside edge of the grocery store. This includes fresh fruits and vegetables, bulk grains, fresh meats, and fresh dairy. Cooking  Use low-heat cooking methods, such as baking, instead of high-heat cooking methods like deep frying.  Cook using healthy oils, such as olive, canola, or sunflower oil.  Avoid cooking with butter, cream, or high-fat meats. Meal planning  Eat meals and snacks regularly, preferably at the same times every day. Avoid going long periods of time without eating.  Eat foods high in fiber, such as fresh fruits, vegetables, beans, and whole grains. Talk to your dietitian about how many servings of carbohydrates you can eat at each meal.  Eat 4-6 ounces of lean protein each day, such as lean meat, chicken, fish, eggs, or tofu. 1 ounce is equal to 1 ounce of meat, chicken, or fish, 1 egg, or 1/4 cup of tofu.  Eat some foods each day that contain healthy fats, such as avocado, nuts, seeds, and fish. Lifestyle   Check your blood glucose regularly.  Exercise at least 30 minutes 5 or more days each week, or as told by your health care provider.  Take medicines as told by your health care provider.  Do not use any products that contain nicotine or tobacco, such as  cigarettes and e-cigarettes. If you need help quitting, ask your health care provider.  Work with a Social worker or diabetes educator to identify strategies to manage stress and any emotional and social challenges. What are some questions to ask my health care provider?  Do I need to meet with a diabetes educator?  Do I need to meet with a dietitian?  What number can I call if I have questions?  When are the best times to check my blood glucose? Where to find more information:  American Diabetes Association: diabetes.org/food-and-fitness/food  Academy of Nutrition and Dietetics: PokerClues.dk  [employer] of Diabetes and Digestive and Kidney Diseases (NIH): ContactWire.be Summary  A healthy meal plan will help you control your blood glucose and maintain a healthy lifestyle.  Working with a diet and nutrition specialist (dietitian) can help you make a meal plan that is best for you.  Keep in mind that carbohydrates and alcohol have immediate effects on your blood glucose levels. It is important to count carbohydrates and  to use alcohol carefully. This information is not intended to replace advice given to you by your health care provider. Make sure you discuss any questions you have with your health care provider. Document Released: 01/18/2005 Document Revised: 05/28/2016 Document Reviewed: 05/28/2016 Elsevier Interactive Patient Education  2018 [employer].  IF you received an x-ray today, you will receive an invoice from [facility]. Please contact [facility] at [phone] with questions or concerns regarding your invoice.   IF you received labwork today, you will receive an invoice from [facility]. Please contact [facility] at [phone] with questions or concerns regarding your invoice.   Our billing staff will not be able to assist  you with questions regarding bills from these companies.  You will be contacted with the lab results as soon as they are available. The fastest way to get your results is to activate your My Chart account. Instructions are located on the last page of this paperwork. If you have not heard from [place] regarding the results in 2 weeks, please contact this office.

## 2017-08-13 NOTE — Progress Notes (Signed)
Sheila Austin  MRN: [id number] DOB: 1985-08-22  PCP: Ivar Drape D, PA  Subjective:  Pt is a 32 year old female PMH HTN, DM who presents to clinic for possible exposure to STDs.  She separated from her former husband in 2016 - she has not had intercourse with ex-husband since 2016. Last month he called her and let her know he is +trichomonas. She has not been tested for STD since her divorce. Unknown when he was last tested and he was unfaithful during their marriage.  She is feeling well today and is asymptomatic.  ROS below.   She is not utd PAP   DM - she is taking Metformin 533m once daily and Januvia 1045mqd. She has cut out sodas and walks 4x/week.   Review of Systems  Constitutional: Negative for chills, diaphoresis, fatigue and fever.  Genitourinary: Negative for dyspareunia, dysuria, frequency, hematuria, menstrual problem, pelvic pain, urgency, vaginal bleeding, vaginal discharge and vaginal pain.  Skin: Negative for rash.  Neurological: Negative for dizziness and headaches.    Patient Active Problem List   Diagnosis Date Noted  . Class 3 severe obesity with body mass index (BMI) of 40.0 to 44.9 in adult (HCLos Alamitos03/09/2017  . Abscess of right breast 02/24/2016  . BMI 45.0-49.9, adult (HCSparta05/25/2017  . Skin lesions - right lower extremity 12/07/2013  . Morbid obesity with BMI of 50.0-59.9, adult (HCHunters [place]05/31/2013  . Obstructive sleep apnea 10/05/2011  . Hypertriglyceridemia 09/27/2011  . Migraine 09/07/2011  . Essential hypertension, benign 11/01/2008  . Gastroparesis 09/27/2008  . IRREGULAR MENSES 08/24/2008  . Diabetes mellitus out of control (HCMayo02/28/2008    Current Outpatient Medications on File Prior to Visit  Medication Sig Dispense Refill  . Blood Glucose Monitoring Suppl KIT 1 kit by Does not apply route every morning. 1 each 0  . cetirizine (ZYRTEC) 10 MG tablet Take 10 mg by mouth daily as needed for allergies.    . Marland Kitchenoperamide (IMODIUM A-D)  2 MG tablet Take 2 mg by mouth 4 (four) times daily as needed for diarrhea or loose stools.    . metFORMIN (GLUCOPHAGE-XR) 500 MG 24 hr tablet Take 1 tablet (500 mg total) by mouth daily with breakfast. 30 tablet 1  . promethazine (PHENERGAN) 25 MG tablet Take 1 tablet (25 mg total) by mouth every 6 (six) hours as needed for nausea or vomiting. 10 tablet 0  . azithromycin (ZITHROMAX) 250 MG tablet Take 2 tabs PO x 1 dose, then 1 tab PO QD x 4 days (Patient not taking: Reported on 07/09/2017) 6 tablet 0  . dapagliflozin propanediol (FARXIGA) 5 MG TABS tablet Take 5 mg by mouth daily. (Patient not taking: Reported on 06/26/2017) 30 tablet 1  . Guaifenesin (MUCINEX MAXIMUM STRENGTH) 1200 MG TB12 Take 1 tablet (1,200 mg total) by mouth every 12 (twelve) hours as needed. (Patient not taking: Reported on 07/09/2017) 14 tablet 1  . ipratropium (ATROVENT) 0.03 % nasal spray Place 2 sprays into both nostrils 2 (two) times daily. (Patient not taking: Reported on 07/09/2017) 30 mL 0  . levofloxacin (LEVAQUIN) 750 MG tablet Take 1 tablet (750 mg total) by mouth daily. (Patient not taking: Reported on 07/09/2017) 5 tablet 0  . sitaGLIPtin (JANUVIA) 100 MG tablet Take 1 tablet (100 mg total) by mouth daily. Start with half tablet daily first week.  Then increase the the full tablet. (Patient not taking: Reported on 08/13/2017) 30 tablet 2  . traZODone (DESYREL) 50 MG tablet Take  1 tablet (50 mg total) by mouth at bedtime as needed for sleep. (Patient not taking: Reported on 06/26/2017) 30 tablet 0   No current facility-administered medications on file prior to visit.     Allergies  Allergen Reactions  . Bee Venom Anaphylaxis, Hives, Shortness Of Breath and Swelling  . Penicillins Anaphylaxis    Has patient had a PCN reaction causing immediate rash, facial/tongue/throat swelling, SOB or lightheadedness with hypotension: yes Has patient had a PCN reaction causing severe rash involving mucus membranes or skin necrosis:  unknown Has patient had a PCN reaction that required hospitalization : yes Has patient had a PCN reaction occurring within the last 10 years: no If all of the above answers are "NO", then may proceed with Cephalosporin use.      Objective:  BP 138/90   Pulse (!) 111   Temp 98.2 F (36.8 C) (Oral)   Resp 16   Ht 5' 3"  (1.6 m)   Wt 260 lb (117.9 kg)   SpO2 100%   BMI 46.06 kg/m   Physical Exam  Constitutional: She is oriented to person, place, and time. No distress.  obese  Cardiovascular: Normal rate, regular rhythm and normal heart sounds.  Genitourinary: Vagina normal and uterus normal. Pelvic exam was performed with patient supine. There is no rash, tenderness or lesion on the right labia. There is no rash, tenderness or lesion on the left labia. Cervix exhibits no motion tenderness, no discharge and no friability. Right adnexum displays no mass and no tenderness. Left adnexum displays no mass and no tenderness.  Neurological: She is alert and oriented to person, place, and time.  Skin: Skin is warm and dry.  Psychiatric: Judgment normal.  Vitals reviewed.  Results for orders placed or performed in visit on 08/13/17  POCT Wet + KOH Prep  Result Value Ref Range   Yeast by KOH Absent Absent   Yeast by wet prep Absent Absent   WBC by wet prep Few Few   Clue Cells Wet Prep HPF POC None None   Trich by wet prep Absent Absent   Bacteria Wet Prep HPF POC Many (A) Few   Epithelial Cells By [employer] Pref (UMFC) Moderate (A) None, Few, Too numerous to count   RBC,UR,HPF,POC None None RBC/hpf  POCT glycosylated hemoglobin (Hb A1C)  Result Value Ref Range   Hemoglobin A1C 12.1   POCT urinalysis dipstick  Result Value Ref Range   Color, UA yellow yellow   Clarity, UA cloudy (A) clear   Glucose, UA =250 (A) negative mg/dL   Bilirubin, UA negative negative   Ketones, POC UA negative negative mg/dL   Spec Grav, UA >=1.030 (A) 1.010 - 1.025   Blood, UA moderate (A) negative   pH, UA  5.5 5.0 - 8.0   Protein Ur, POC =100 (A) negative mg/dL   Urobilinogen, UA 0.2 0.2 or 1.0 E.U./dL   Nitrite, UA Negative Negative   Leukocytes, UA Small (1+) (A) Negative    Assessment and Plan :  1. Possible exposure to STD 2. Screening for cervical cancer - POCT Wet + KOH Prep - HIV antibody - RPR - POCT glycosylated hemoglobin (Hb A1C) - Pap IG, CT/NG w/ reflex HPV when ASC-U - Pt would like screening for STDs today after ex-husband informed her that he is +trich. They have not had intercourse in 2 years. She is asymptomatic today. PAP done today, no suspicious findings on PE. Negative wet prep.  3. Uncontrolled type 2 diabetes mellitus  with hyperglycemia (HCC) 4. Class 3 severe obesity with body mass index (BMI) of 40.0 to 44.9 in adult, unspecified obesity type, unspecified whether serious comorbidity present (HCC) - POCT urinalysis dipstick - metFORMIN (GLUMETZA) 1000 MG (MOD) 24 hr tablet; Take 1 tablet (1,000 mg total) by mouth 2 (two) times daily with a meal.  Dispense: 60 tablet; Refill: 5 - sitaGLIPtin (JANUVIA) 100 MG tablet; Take 1 tablet (100 mg total) by mouth daily. Start with half tablet daily first week.  Then increase the the full tablet.  Dispense: 30 tablet; Refill: 2 - A1C con't to improve, however is still >10. She never titrated Metformin up and is still taking 582m qd. Con't Januvia, increase metformin to 1,000 mg bid. RTC in 2 months for recheck.   WMercer Pod PA-C  [facility]4/01/2018 4:06 PM

## 2017-08-14 ENCOUNTER — Encounter: Payer: Self-pay | Admitting: Physician Assistant

## 2017-08-14 ENCOUNTER — Telehealth: Payer: Self-pay | Admitting: Physician Assistant

## 2017-08-14 ENCOUNTER — Other Ambulatory Visit: Payer: Self-pay | Admitting: Physician Assistant

## 2017-08-14 DIAGNOSIS — E119 Type 2 diabetes mellitus without complications: Secondary | ICD-10-CM

## 2017-08-14 LAB — RPR: RPR Ser Ql: NONREACTIVE

## 2017-08-14 LAB — HIV ANTIBODY (ROUTINE TESTING W REFLEX): HIV Screen 4th Generation wRfx: NONREACTIVE

## 2017-08-14 MED ORDER — METFORMIN HCL ER 500 MG PO TB24: 1000.0000 mg | ORAL_TABLET | Freq: Two times a day (BID) | ORAL | 3 refills | Status: DC

## 2017-08-14 NOTE — Telephone Encounter (Signed)
Copied from [place] [id number]. Topic: Quick Communication - See Telephone Encounter >> Aug 14, 2017  9:50 AM Ether Griffins B wrote: CRM for notification. See Telephone encounter for: 08/14/17.  Mary with [facility] calling in regards to the metFORMIN (GLUMETZA) 1000 MG (MOD) 24 hr tablet. She states this is very expensive and is wanting to know if Metformin ER 500 mg can be called in for the pt instead.

## 2017-08-14 NOTE — Telephone Encounter (Signed)
Provider, please advise.  

## 2017-08-15 LAB — PAP IG, CT-NG, RFX HPV ASCU
Chlamydia, Nuc. Acid Amp: NEGATIVE
Gonococcus by Nucleic Acid Amp: NEGATIVE
PAP Smear Comment: 0

## 2017-08-19 MED FILL — METFORMIN HCL ER 500 MG TAB: 500 | 30 days supply | Qty: 120 | Fill #0

## 2017-08-21 ENCOUNTER — Encounter: Payer: Self-pay | Admitting: Physician Assistant

## 2017-08-22 MED FILL — JANUVIA 100 MG TABLET: 100 | 30 days supply | Qty: 30 | Fill #2

## 2017-09-10 ENCOUNTER — Encounter: Payer: Self-pay | Admitting: Physician Assistant

## 2017-09-18 ENCOUNTER — Telehealth: Payer: Self-pay | Admitting: Physician Assistant

## 2017-09-18 ENCOUNTER — Other Ambulatory Visit: Payer: Self-pay | Admitting: Physician Assistant

## 2017-09-18 DIAGNOSIS — E1165 Type 2 diabetes mellitus with hyperglycemia: Secondary | ICD-10-CM

## 2017-09-18 NOTE — Telephone Encounter (Signed)
I am referring pt to endocrinology. She has had A1C >10 for the past 4 years. She needs to discuss blood sugar control with endocrinologist and possibly start insulin therapy.

## 2017-09-18 NOTE — Telephone Encounter (Signed)
Patient advised.

## 2017-09-18 NOTE — Telephone Encounter (Signed)
Pt called stating that she is unable to take the metformin because she is having stomach problems "from the top and bottom";; she says that she is no longer taking the medication because of her symptoms; her last dose was August 24, 2017; pt also given instructions in my chart per Whitney McVey dated 09/17/17; spoke with Jenny Reichmann at [facility]; they will contact pt with instructions; her best contact number is [phone]; the pt verbalizes understanding.

## 2017-10-08 ENCOUNTER — Encounter: Payer: 59 | Admitting: Physician Assistant

## 2017-10-15 ENCOUNTER — Ambulatory Visit: Payer: 59 | Admitting: Physician Assistant

## 2017-10-17 ENCOUNTER — Encounter: Payer: Self-pay | Admitting: Endocrinology

## 2018-06-25 ENCOUNTER — Emergency Department (HOSPITAL_COMMUNITY)
Admission: EM | Admit: 2018-06-25 | Discharge: 2018-06-25 | Disposition: A | Discharge status: Home / Self Care | Payer: 59 | Attending: Emergency Medicine | Admitting: Emergency Medicine

## 2018-06-25 ENCOUNTER — Encounter (HOSPITAL_COMMUNITY): Payer: Self-pay | Admitting: *Deleted

## 2018-06-25 DIAGNOSIS — K0889 Other specified disorders of teeth and supporting structures: Secondary | ICD-10-CM | POA: Insufficient documentation

## 2018-06-25 DIAGNOSIS — Z7984 Long term (current) use of oral hypoglycemic drugs: Secondary | ICD-10-CM | POA: Diagnosis not present

## 2018-06-25 DIAGNOSIS — I10 Essential (primary) hypertension: Secondary | ICD-10-CM | POA: Diagnosis not present

## 2018-06-25 DIAGNOSIS — E119 Type 2 diabetes mellitus without complications: Secondary | ICD-10-CM | POA: Diagnosis not present

## 2018-06-25 DIAGNOSIS — Z79899 Other long term (current) drug therapy: Secondary | ICD-10-CM | POA: Diagnosis not present

## 2018-06-25 MED ORDER — LIDOCAINE VISCOUS HCL 2 % MT SOLN: 15.0000 mL | OROMUCOSAL | 2 refills | Status: DC | PRN

## 2018-06-25 MED ORDER — CLINDAMYCIN HCL 300 MG PO CAPS: 300.0000 mg | ORAL_CAPSULE | Freq: Three times a day (TID) | ORAL | 0 refills | Status: DC

## 2018-06-25 MED ORDER — ONDANSETRON 4 MG PO TBDP: 4.0000 mg | ORAL_TABLET | Freq: Three times a day (TID) | ORAL | 0 refills | Status: DC | PRN

## 2018-06-25 MED FILL — ONDANSETRON ODT 4 MG TABLET: 4 | 7 days supply | Qty: 20 | Fill #0

## 2018-06-25 MED FILL — LIDOCAINE 2% VISCOUS SOLN: 2 | 5 days supply | Qty: 100 | Fill #0

## 2018-06-25 MED FILL — CLINDAMYCIN HCL 300 MG CAPS: 300 | 7 days supply | Qty: 21 | Fill #0

## 2018-06-25 NOTE — Discharge Instructions (Addendum)
°  Dental Pain You have been seen today for dental pain. You should follow up with a dentist as soon as possible. This problem will not resolve on its own without the care of a dentist. Use ibuprofen or naproxen for pain. Use the viscous lidocaine for mouth pain. Swish with the lidocaine and spit it out. Do not swallow it. You should also swish with a homemade salt water solution, twice a day.  Make this solution by mixing 8 ounces of warm water with about half a teaspoon of salt. Antiinflammatory medications: Take 600 mg of ibuprofen every 6 hours or 440 mg (over the counter dose) to 500 mg (prescription dose) of naproxen every 12 hours for the next 3 days. After this time, these medications may be used as needed for pain. Take these medications with food to avoid upset stomach. Choose only one of these medications, do not take them together. Acetaminophen (generic for Tylenol): Should you continue to have additional pain while taking the ibuprofen or naproxen, you may add in acetaminophen as needed. Your daily total maximum amount of acetaminophen from all sources should be limited to 4000mg /day for persons without liver problems, or 2000mg /day for those with liver problems.  Please take all of your antibiotics until finished!   You may develop abdominal discomfort or diarrhea from the antibiotic.  You may help offset this with probiotics which you can buy or get in yogurt. Do not eat or take the probiotics until 2 hours after your antibiotic.   Nausea/vomiting: Use the ondansetron (generic for Zofran) for nausea or vomiting.  This medication may not prevent all vomiting or nausea, but can help facilitate better hydration. Things that can help with nausea/vomiting also include peppermint/menthol candies, vitamin B12, and ginger.  For prescription assistance, may try using prescription discount sites or apps, such as goodrx.[place], [address] [place], [place] [address] [phone]  [place], [place] [address] [phone]  [facility] [address] [place], [place] [address] [phone] ext. [place] [address] [place] [address] [place], [place] [address] [phone]  [facility] [address] [place], [place] [address] [phone]  [facility] [employer] [email or web address]  [place] [address] [place], [place] [address] [phone]  Website for free, low-income, or sliding scale dental services in [place]: www.freedentalcare.[place]  To find a dentist in [place] and surrounding areas: CardCollectible.com.ee  [facility] MusicClient.gl  [facility] Medicaid Dentist [email or web address]

## 2018-06-25 NOTE — ED Triage Notes (Signed)
Pt in c/o dental pain since yesterday, no distress noted

## 2018-06-25 NOTE — ED Provider Notes (Signed)
[facility] EMERGENCY DEPARTMENT Provider Note   CSN: [id number] Arrival date & time: 06/25/18  1607    History   Chief Complaint Chief Complaint  Patient presents with  . Dental Pain    HPI Sheila Austin is a 33 y.o. female.     HPI   Sheila Austin is a 33 y.o. female, with a history of DM and HTN, presenting to the ED with dental pain beginning yesterday. Pain to the right upper dentition, shooting/stabbing, moderate to severe, radiating throughout the right side of the face.  Denies fever/chills, nausea/vomiting, throat swelling, shortness of breath, difficulty swallowing, pain in the eyes, pain with eye movement, vision changes, or any other complaints.    Past Medical History:  Diagnosis Date  . Diabetes mellitus   . E. coli sepsis ([place])   . Ectopic pregnancy   . Essential hypertension, benign 11/01/2008  . Fatty liver   . Gastroparesis   . GERD (gastroesophageal reflux disease)   . H. pylori infection   . Helicobacter pylori gastritis 09/07/2011  . Migraine   . Morbid obesity with BMI of 50.0-59.9, adult ([place]) 10/05/2011  . Pulmonary edema   . Pyelonephritis   . Ureteral obstruction     Patient Active Problem List   Diagnosis Date Noted  . Class 3 severe obesity with body mass index (BMI) of 40.0 to 44.9 in adult ([place]) 07/09/2017  . Abscess of right breast 02/24/2016  . BMI 45.0-49.9, adult ([place]) 09/29/2015  . Skin lesions - right lower extremity 12/07/2013  . Morbid obesity with BMI of 50.0-59.9, adult ([place]) 10/05/2011  . Obstructive sleep apnea 10/05/2011  . Hypertriglyceridemia 09/27/2011  . Migraine 09/07/2011  . Essential hypertension, benign 11/01/2008  . Gastroparesis 09/27/2008  . IRREGULAR MENSES 08/24/2008  . Diabetes mellitus out of control ([place]) 07/04/2006    Past Surgical History:  Procedure Laterality Date  . ESOPHAGOGASTRODUODENOSCOPY  2010     OB History    Gravida  1   Para      Term      Preterm     AB  1   Living  0     SAB      TAB      Ectopic  1   Multiple      Live Births               Home Medications    Prior to Admission medications   Medication Sig Start Date End Date Taking? Authorizing Provider  Blood Glucose Monitoring Suppl KIT 1 kit by Does not apply route every morning. 06/26/17   Ivar Drape D, PA  cetirizine (ZYRTEC) 10 MG tablet Take 10 mg by mouth daily as needed for allergies.    [provider]  clindamycin (CLEOCIN) 300 MG capsule Take 1 capsule (300 mg total) by mouth 3 (three) times daily for 7 days. 06/25/18 07/02/18  Reakwon Barren C, PA-C  lidocaine (XYLOCAINE) 2 % solution Use as directed 15 mLs in the mouth or throat as needed for mouth pain. 06/25/18   Dana Debo C, PA-C  loperamide (IMODIUM A-D) 2 MG tablet Take 2 mg by mouth 4 (four) times daily as needed for diarrhea or loose stools.    [provider]  metFORMIN (GLUCOPHAGE-XR) 500 MG 24 hr tablet Take 2 tablets (1,000 mg total) by mouth 2 (two) times daily with a meal. 08/14/17   McVey, Gelene Mink, PA-C  metFORMIN (GLUMETZA) 1000 MG (MOD) 24 hr tablet  Take 1 tablet (1,000 mg total) by mouth 2 (two) times daily with a meal. 08/13/17   McVey, Gelene Mink, PA-C  ondansetron (ZOFRAN ODT) 4 MG disintegrating tablet Take 1 tablet (4 mg total) by mouth every 8 (eight) hours as needed for nausea or vomiting. 06/25/18   Shervon Kerwin, Helane Gunther, PA-C  promethazine (PHENERGAN) 25 MG tablet Take 1 tablet (25 mg total) by mouth every 6 (six) hours as needed for nausea or vomiting. 06/16/17   Kirichenko, Lahoma Rocker, PA-C  sitaGLIPtin (JANUVIA) 100 MG tablet Take 1 tablet (100 mg total) by mouth daily. Start with half tablet daily first week.  Then increase the the full tablet. 08/13/17   McVey, Gelene Mink, PA-C    Family History Family History  Problem Relation Age of Onset  . Arthritis Mother   . Asthma Mother   . COPD Mother   . Depression Mother   . Diabetes Mother   .  Hypertension Mother   . Mental illness Mother   . Kidney disease Mother   . Crohn's disease Mother   . Aneurysm Father 67       brain  . Mental illness Sister   . Arthritis Maternal Aunt   . Asthma Maternal Aunt   . COPD Maternal Aunt   . Depression Maternal Aunt   . Diabetes Maternal Aunt   . Hypertension Maternal Aunt   . Mental illness Maternal Aunt   . Stroke Maternal Aunt   . Heart failure Maternal Aunt   . Heart failure Maternal Grandfather   . Cancer Maternal Grandfather   . Diabetes Maternal Grandfather   . Heart disease Maternal Grandfather   . Hypertension Maternal Grandfather   . Hyperlipidemia Maternal Grandfather   . Mental illness Brother   . Diabetes Maternal Grandmother   . Heart disease Maternal Grandmother   . Hypertension Maternal Grandmother   . Hyperlipidemia Maternal Grandmother   . Diabetes Paternal Grandmother   . Heart disease Paternal Grandmother   . Diabetes Paternal Grandfather   . Cancer Paternal Grandfather     Social History Social History   Tobacco Use  . Smoking status: Never Smoker  . Smokeless tobacco: Never Used  Substance Use Topics  . Alcohol use: Yes    Comment: occasional  . Drug use: No     Allergies   Bee venom and Penicillins   Review of Systems Review of Systems  Constitutional: Negative for chills and fever.  HENT: Positive for dental problem and facial swelling. Negative for sore throat and trouble swallowing.   Eyes: Negative for pain, redness and visual disturbance.  Respiratory: Negative for shortness of breath.   Gastrointestinal: Negative for abdominal pain, nausea and vomiting.  Musculoskeletal: Negative for neck pain and neck stiffness.  All other systems reviewed and are negative.    Physical Exam Updated Vital Signs BP (!) 158/102 (BP Location: Right Arm)   Pulse (!) 111   Temp 98 F (36.7 C) (Oral)   Resp (!) 22   SpO2 100%   Physical Exam Vitals signs and nursing note reviewed.    Constitutional:      General: She is not in acute distress.    Appearance: She is well-developed. She is not diaphoretic.  HENT:     Head: Normocephalic and atraumatic.     Comments: Some right-sided facial edema from the right maxillary region to the right infraorbital region.  No pain with EOMs.  No tenderness or edema within the orbit.    Nose: Nose  normal.     Mouth/Throat:     Mouth: Mucous membranes are moist.     Pharynx: Oropharynx is clear.     Comments: Erosion and tenderness to what appears to be the region of the right maxillary premolars or first molar.  Multiple teeth are absent.  Poor dentition throughout. No noted area of intraoral swelling or fluctuance.  No trismus.  Mouth opening to at least 3 finger widths.  Handles oral secretions without difficulty.  No swelling or tenderness to the submental or submandibular regions.  No swelling or tenderness into the soft tissues of the neck. Eyes:     Extraocular Movements: Extraocular movements intact.     Conjunctiva/sclera: Conjunctivae normal.     Pupils: Pupils are equal, round, and reactive to light.  Neck:     Musculoskeletal: Neck supple.  Cardiovascular:     Rate and Rhythm: Normal rate and regular rhythm.     Pulses: Normal pulses.  Pulmonary:     Effort: Pulmonary effort is normal. No respiratory distress.  Abdominal:     Tenderness: There is no guarding.  Musculoskeletal:     Right lower leg: No edema.     Left lower leg: No edema.  Lymphadenopathy:     Cervical: No cervical adenopathy.  Skin:    General: Skin is warm and dry.  Neurological:     Mental Status: She is alert.  Psychiatric:        Mood and Affect: Mood and affect normal.        Speech: Speech normal.        Behavior: Behavior normal.      ED Treatments / Results  Labs (all labs ordered are listed, but only abnormal results are displayed) Labs Reviewed - No data to display  EKG None  Radiology No results  found.  Procedures Dental Block Date/Time: 06/25/2018 11:37 AM Performed by: Lorayne Bender, PA-C Authorized by: Lorayne Bender, PA-C   Consent:    Consent obtained:  Verbal   Consent given by:  Patient   Risks discussed:  Hematoma, swelling, unsuccessful block and pain Indications:    Indications: dental pain   Location:    Block type:  Middle superior alveolar   Laterality:  Right Procedure details (see MAR for exact dosages):    Topical anesthetic:  Benzocaine gel   Syringe type:  Controlled syringe   Needle gauge:  27 G   Anesthetic injected:  Bupivacaine 0.5% WITH epi   Injection procedure:  Anatomic landmarks identified, anatomic landmarks palpated, introduced needle, negative aspiration for blood and incremental injection Post-procedure details:    Outcome:  Anesthesia achieved   Patient tolerance of procedure:  Tolerated well, no immediate complications   (including critical care time)  Medications Ordered in ED Medications - No data to display   Initial Impression / Assessment and Plan / ED Course  I have reviewed the triage vital signs and the nursing notes.  Pertinent labs & imaging results that were available during my care of the patient were reviewed by me and considered in my medical decision making (see chart for details).        Patient presents with right upper dental pain with some facial swelling.  She does have some swelling to the face on my exam, but my suspicion for periorbital or orbital cellulitis is low.  Patient called the dentist while here in the ED and set up an appointment with Dr. Mariel Sleet on Monday, Feb 24.  The patient was given instructions for home care as well as return precautions. Patient voices understanding of these instructions, accepts the plan, and is comfortable with discharge.   Final Clinical Impressions(s) / ED Diagnoses   Final diagnoses:  Pain, dental    ED Discharge Orders         Ordered    clindamycin (CLEOCIN)  300 MG capsule  3 times daily    Note to Pharmacy:  May fill as 150 mg capsules if more economical for the patient   06/25/18 1106    ondansetron (ZOFRAN ODT) 4 MG disintegrating tablet  Every 8 hours PRN     06/25/18 1106    lidocaine (XYLOCAINE) 2 % solution  As needed     06/25/18 [place], [place] C, PA-C 06/25/18 1256    Tegeler, Gwenyth Allegra, MD 06/25/18 1723

## 2018-07-02 ENCOUNTER — Other Ambulatory Visit: Payer: Self-pay

## 2018-07-02 ENCOUNTER — Inpatient Hospital Stay (HOSPITAL_COMMUNITY)
Admission: EM | Admit: 2018-07-02 | Discharge: 2018-07-08 | DRG: 603 | Disposition: A | Discharge status: Home / Self Care | Payer: 59 | Attending: Family Medicine | Admitting: Family Medicine

## 2018-07-02 ENCOUNTER — Emergency Department (HOSPITAL_COMMUNITY): Payer: 59

## 2018-07-02 ENCOUNTER — Encounter (HOSPITAL_COMMUNITY): Payer: Self-pay

## 2018-07-02 DIAGNOSIS — Z8249 Family history of ischemic heart disease and other diseases of the circulatory system: Secondary | ICD-10-CM | POA: Diagnosis not present

## 2018-07-02 DIAGNOSIS — L03213 Periorbital cellulitis: Secondary | ICD-10-CM | POA: Diagnosis present

## 2018-07-02 DIAGNOSIS — Z8614 Personal history of Methicillin resistant Staphylococcus aureus infection: Secondary | ICD-10-CM | POA: Diagnosis not present

## 2018-07-02 DIAGNOSIS — L03211 Cellulitis of face: Secondary | ICD-10-CM | POA: Diagnosis present

## 2018-07-02 DIAGNOSIS — Z6841 Body Mass Index (BMI) 40.0 and over, adult: Secondary | ICD-10-CM

## 2018-07-02 DIAGNOSIS — Z7984 Long term (current) use of oral hypoglycemic drugs: Secondary | ICD-10-CM

## 2018-07-02 DIAGNOSIS — E781 Pure hyperglyceridemia: Secondary | ICD-10-CM | POA: Diagnosis present

## 2018-07-02 DIAGNOSIS — Z880 Allergy status to penicillin: Secondary | ICD-10-CM | POA: Diagnosis not present

## 2018-07-02 DIAGNOSIS — K3184 Gastroparesis: Secondary | ICD-10-CM | POA: Diagnosis present

## 2018-07-02 DIAGNOSIS — Z91030 Bee allergy status: Secondary | ICD-10-CM | POA: Diagnosis not present

## 2018-07-02 DIAGNOSIS — Z833 Family history of diabetes mellitus: Secondary | ICD-10-CM | POA: Diagnosis not present

## 2018-07-02 DIAGNOSIS — K219 Gastro-esophageal reflux disease without esophagitis: Secondary | ICD-10-CM | POA: Diagnosis present

## 2018-07-02 DIAGNOSIS — Z9114 Patient's other noncompliance with medication regimen: Secondary | ICD-10-CM | POA: Diagnosis not present

## 2018-07-02 DIAGNOSIS — IMO0002 Reserved for concepts with insufficient information to code with codable children: Secondary | ICD-10-CM | POA: Diagnosis present

## 2018-07-02 DIAGNOSIS — Z79899 Other long term (current) drug therapy: Secondary | ICD-10-CM | POA: Diagnosis not present

## 2018-07-02 DIAGNOSIS — I10 Essential (primary) hypertension: Secondary | ICD-10-CM | POA: Diagnosis present

## 2018-07-02 DIAGNOSIS — K760 Fatty (change of) liver, not elsewhere classified: Secondary | ICD-10-CM | POA: Diagnosis present

## 2018-07-02 DIAGNOSIS — E1165 Type 2 diabetes mellitus with hyperglycemia: Secondary | ICD-10-CM | POA: Diagnosis present

## 2018-07-02 DIAGNOSIS — G4733 Obstructive sleep apnea (adult) (pediatric): Secondary | ICD-10-CM | POA: Diagnosis present

## 2018-07-02 DIAGNOSIS — E1143 Type 2 diabetes mellitus with diabetic autonomic (poly)neuropathy: Secondary | ICD-10-CM | POA: Diagnosis present

## 2018-07-02 DIAGNOSIS — R220 Localized swelling, mass and lump, head: Secondary | ICD-10-CM | POA: Diagnosis not present

## 2018-07-02 LAB — CBC WITH DIFFERENTIAL/PLATELET
ABS IMMATURE GRANULOCYTES: 0.09 10*3/uL — AB (ref 0.00–0.07)
Basophils Absolute: 0.1 10*3/uL (ref 0.0–0.1)
Basophils Relative: 1 %
Eosinophils Absolute: 0.1 10*3/uL (ref 0.0–0.5)
Eosinophils Relative: 1 %
HCT: 37.7 % (ref 36.0–46.0)
HEMOGLOBIN: 12.4 g/dL (ref 12.0–15.0)
Immature Granulocytes: 1 %
Lymphocytes Relative: 17 %
Lymphs Abs: 2.8 10*3/uL (ref 0.7–4.0)
MCH: 28.6 pg (ref 26.0–34.0)
MCHC: 32.9 g/dL (ref 30.0–36.0)
MCV: 87.1 fL (ref 80.0–100.0)
Monocytes Absolute: 1.3 10*3/uL — ABNORMAL HIGH (ref 0.1–1.0)
Monocytes Relative: 8 %
NEUTROS ABS: 12.6 10*3/uL — AB (ref 1.7–7.7)
Neutrophils Relative %: 72 %
Platelets: 358 10*3/uL (ref 150–400)
RBC: 4.33 MIL/uL (ref 3.87–5.11)
RDW: 12.4 % (ref 11.5–15.5)
WBC: 17 10*3/uL — ABNORMAL HIGH (ref 4.0–10.5)
nRBC: 0 % (ref 0.0–0.2)

## 2018-07-02 LAB — RAPID URINE DRUG SCREEN, HOSP PERFORMED
AMPHETAMINES: NOT DETECTED
Barbiturates: NOT DETECTED
Benzodiazepines: NOT DETECTED
Cocaine: NOT DETECTED
Opiates: NOT DETECTED
TETRAHYDROCANNABINOL: NOT DETECTED

## 2018-07-02 LAB — BASIC METABOLIC PANEL
Anion gap: 10 (ref 5–15)
BUN: 12 mg/dL (ref 6–20)
CO2: 27 mmol/L (ref 22–32)
Calcium: 8.9 mg/dL (ref 8.9–10.3)
Chloride: 94 mmol/L — ABNORMAL LOW (ref 98–111)
Creatinine, Ser: 0.74 mg/dL (ref 0.44–1.00)
GFR calc Af Amer: >60 mL/min (ref >60)
GFR calc non Af Amer: >60 mL/min (ref >60)
Glucose, Bld: 331 mg/dL — ABNORMAL HIGH (ref 70–99)
Potassium: 4.3 mmol/L (ref 3.5–5.1)
Sodium: 131 mmol/L — ABNORMAL LOW (ref 135–145)

## 2018-07-02 LAB — CBG MONITORING, ED: Glucose-Capillary: 348 mg/dL — ABNORMAL HIGH (ref 70–99)

## 2018-07-02 LAB — LACTIC ACID, PLASMA: Lactic Acid, Venous: 1.2 mmol/L (ref 0.5–1.9)

## 2018-07-02 LAB — PREGNANCY, URINE: Preg Test, Ur: NEGATIVE

## 2018-07-02 MED ORDER — CLINDAMYCIN PHOSPHATE 600 MG/50ML IV SOLN
600.0000 mg | Freq: Once | INTRAVENOUS | Status: AC
  Administered 2018-07-02: 600 mg via INTRAVENOUS
  Filled 2018-07-02: qty 50

## 2018-07-02 MED ORDER — ONDANSETRON HCL 4 MG PO TABS: 4.0000 mg | ORAL_TABLET | Freq: Four times a day (QID) | ORAL | Status: DC | PRN

## 2018-07-02 MED ORDER — VANCOMYCIN HCL IN DEXTROSE 1-5 GM/200ML-% IV SOLN
1000.0000 mg | Freq: Once | INTRAVENOUS | Status: AC
  Administered 2018-07-02: 1000 mg via INTRAVENOUS
  Filled 2018-07-02: qty 200

## 2018-07-02 MED ORDER — ONDANSETRON HCL 4 MG/2ML IJ SOLN
4.0000 mg | Freq: Four times a day (QID) | INTRAMUSCULAR | Status: DC | PRN
  Administered 2018-07-02 – 2018-07-06 (×4): 4 mg via INTRAVENOUS
  Filled 2018-07-02 (×4): qty 2

## 2018-07-02 MED ORDER — VANCOMYCIN HCL IN DEXTROSE 750-5 MG/150ML-% IV SOLN
750.0000 mg | Freq: Two times a day (BID) | INTRAVENOUS | Status: DC
  Administered 2018-07-03 – 2018-07-04 (×3): 750 mg via INTRAVENOUS
  Filled 2018-07-02 (×4): qty 150

## 2018-07-02 MED ORDER — METRONIDAZOLE IN NACL 5-0.79 MG/ML-% IV SOLN
500.0000 mg | Freq: Three times a day (TID) | INTRAVENOUS | Status: DC
  Administered 2018-07-02 – 2018-07-04 (×7): 500 mg via INTRAVENOUS
  Filled 2018-07-02 (×7): qty 100

## 2018-07-02 MED ORDER — ENOXAPARIN SODIUM 60 MG/0.6ML ~~LOC~~ SOLN
0.5000 mg/kg | SUBCUTANEOUS | Status: DC
  Administered 2018-07-02 – 2018-07-05 (×4): 60 mg via SUBCUTANEOUS
  Filled 2018-07-02 (×5): qty 0.6

## 2018-07-02 MED ORDER — METHYLPREDNISOLONE SODIUM SUCC 125 MG IJ SOLR
80.0000 mg | Freq: Once | INTRAMUSCULAR | Status: AC
  Administered 2018-07-02: 80 mg via INTRAVENOUS
  Filled 2018-07-02: qty 2

## 2018-07-02 MED ORDER — SODIUM CHLORIDE 0.9 % IV BOLUS
1000.0000 mL | Freq: Once | INTRAVENOUS | Status: AC
  Administered 2018-07-02: 1000 mL via INTRAVENOUS

## 2018-07-02 MED ORDER — SODIUM CHLORIDE 0.9 % IV SOLN
INTRAVENOUS | Status: AC
  Administered 2018-07-02: 21:00:00 via INTRAVENOUS

## 2018-07-02 MED ORDER — KETOROLAC TROMETHAMINE 30 MG/ML IJ SOLN
30.0000 mg | Freq: Three times a day (TID) | INTRAMUSCULAR | Status: DC
  Administered 2018-07-02: 30 mg via INTRAVENOUS
  Filled 2018-07-02: qty 1

## 2018-07-02 MED ORDER — FENTANYL CITRATE (PF) 100 MCG/2ML IJ SOLN
50.0000 ug | Freq: Once | INTRAMUSCULAR | Status: AC
  Administered 2018-07-02: 50 ug via INTRAVENOUS
  Filled 2018-07-02: qty 2

## 2018-07-02 MED ORDER — IOHEXOL 300 MG/ML  SOLN
75.0000 mL | Freq: Once | INTRAMUSCULAR | Status: AC | PRN
  Administered 2018-07-02: 75 mL via INTRAVENOUS

## 2018-07-02 MED ORDER — INSULIN ASPART 100 UNIT/ML ~~LOC~~ SOLN
0.0000 [IU] | SUBCUTANEOUS | Status: DC
  Administered 2018-07-02: 11 [IU] via SUBCUTANEOUS
  Administered 2018-07-03: 5 [IU] via SUBCUTANEOUS
  Administered 2018-07-03: 2 [IU] via SUBCUTANEOUS
  Administered 2018-07-03 (×2): 15 [IU] via SUBCUTANEOUS
  Administered 2018-07-03: 11 [IU] via SUBCUTANEOUS
  Administered 2018-07-03: 5 [IU] via SUBCUTANEOUS
  Administered 2018-07-04 (×2): 8 [IU] via SUBCUTANEOUS
  Administered 2018-07-04: 11 [IU] via SUBCUTANEOUS
  Administered 2018-07-04: 8 [IU] via SUBCUTANEOUS
  Administered 2018-07-05 (×6): 5 [IU] via SUBCUTANEOUS
  Administered 2018-07-06 (×2): 3 [IU] via SUBCUTANEOUS
  Administered 2018-07-06: 5 [IU] via SUBCUTANEOUS
  Filled 2018-07-02: qty 1

## 2018-07-02 MED ORDER — POLYETHYLENE GLYCOL 3350 17 G PO PACK: 17.0000 g | PACK | Freq: Every day | ORAL | Status: DC | PRN

## 2018-07-02 MED ORDER — LEVOFLOXACIN IN D5W 750 MG/150ML IV SOLN
750.0000 mg | INTRAVENOUS | Status: DC
  Administered 2018-07-02 – 2018-07-03 (×2): 750 mg via INTRAVENOUS
  Filled 2018-07-02 (×2): qty 150

## 2018-07-02 MED ORDER — AMLODIPINE BESYLATE 5 MG PO TABS
5.0000 mg | ORAL_TABLET | Freq: Every day | ORAL | Status: DC
  Administered 2018-07-02 – 2018-07-08 (×7): 5 mg via ORAL
  Filled 2018-07-02 (×7): qty 1

## 2018-07-02 MED ORDER — ACETAMINOPHEN 325 MG PO TABS: 650.0000 mg | ORAL_TABLET | Freq: Four times a day (QID) | ORAL | Status: DC | PRN

## 2018-07-02 MED ORDER — VANCOMYCIN HCL IN DEXTROSE 1-5 GM/200ML-% IV SOLN
1000.0000 mg | INTRAVENOUS | Status: AC
  Administered 2018-07-02: 1000 mg via INTRAVENOUS
  Filled 2018-07-02: qty 200

## 2018-07-02 MED ORDER — ACETAMINOPHEN 650 MG RE SUPP: 650.0000 mg | Freq: Four times a day (QID) | RECTAL | Status: DC | PRN

## 2018-07-02 MED ORDER — TRAMADOL HCL 50 MG PO TABS
50.0000 mg | ORAL_TABLET | Freq: Four times a day (QID) | ORAL | Status: DC | PRN
  Administered 2018-07-02 – 2018-07-06 (×6): 50 mg via ORAL
  Filled 2018-07-02 (×6): qty 1

## 2018-07-02 MED ORDER — KETOROLAC TROMETHAMINE 30 MG/ML IJ SOLN
30.0000 mg | Freq: Three times a day (TID) | INTRAMUSCULAR | Status: AC
  Administered 2018-07-03 (×2): 30 mg via INTRAVENOUS
  Filled 2018-07-02 (×2): qty 1

## 2018-07-02 NOTE — ED Triage Notes (Signed)
Pt seen last Wednesday at [facility] for dental pain and had nerve block. Pt seen at dentist on Thursday and had 2 teeth pulled. Pt states swelling has not went down since extraction on Thursday and she called her dentist today and was told to come to ED.

## 2018-07-02 NOTE — H&P (Signed)
History and Physical    Sheila Austin ZPH:[id number] DOB: Mar 06, 1986 DOA: 07/02/2018  PCP: Patient, No Pcp Per   Patient coming from: Home  I have personally briefly reviewed patient's old medical records in [place]  Chief Complaint: Facial swelling  HPI: Sheila Austin is a 33 y.o. female with medical history significant for morbid obesity, DM, HTN, OSA, E. coli sepsis, who presented to the ED with complaints of swelling of the right side of her face.  Patient had extraction of 2 of her teeth on the right side about a week ago, she was prescribed clindamycin which she reports compliance with.  Despite the swelling on the right side of her face continued increase now involving her eyes such that her right eye is swollen shut.  She reports eye pain, and blurry vision. Reports fever to 100.2 at home, with chills.  No vomiting no loose stools.  No difficulty breathing, no difficulty swallowing, but she cant chew. She tolerated the antibiotics well.  She cannot remember the details of her allergy to penicillin.  ED Course: Tachycardia to 114.  Temperature 98.4.  WBC 17.  Sodium 131.  Maxillofacial CT with contrast-soft tissue swelling right face and orbit compatible with cellulitis, no soft tissue mass.  Patient started on IV clindamycin, 1L bolus given, hospitalist to admit for facial cellulitis.  Review of Systems: As per HPI all other systems reviewed and negative.  Past Medical History:  Diagnosis Date  . Diabetes mellitus   . E. coli sepsis ([place])   . Ectopic pregnancy   . Essential hypertension, benign 11/01/2008  . Fatty liver   . Gastroparesis   . GERD (gastroesophageal reflux disease)   . H. pylori infection   . Helicobacter pylori gastritis 09/07/2011  . Migraine   . Morbid obesity with BMI of 50.0-59.9, adult ([place]) 10/05/2011  . Pulmonary edema   . Pyelonephritis   . Ureteral obstruction     Past Surgical History:  Procedure Laterality Date  .  ESOPHAGOGASTRODUODENOSCOPY  2010     reports that she has never smoked. She has never used smokeless tobacco. She reports current alcohol use. She reports that she does not use drugs.  Allergies  Allergen Reactions  . Bee Venom Anaphylaxis, Hives, Shortness Of Breath and Swelling  . Penicillins Anaphylaxis    Has patient had a PCN reaction causing immediate rash, facial/tongue/throat swelling, SOB or lightheadedness with hypotension: yes Has patient had a PCN reaction causing severe rash involving mucus membranes or skin necrosis: unknown Has patient had a PCN reaction that required hospitalization : yes Has patient had a PCN reaction occurring within the last 10 years: no If all of the above answers are "NO", then may proceed with Cephalosporin use.     Family History  Problem Relation Age of Onset  . Arthritis Mother   . Asthma Mother   . COPD Mother   . Depression Mother   . Diabetes Mother   . Hypertension Mother   . Mental illness Mother   . Kidney disease Mother   . Crohn's disease Mother   . Aneurysm Father 48       brain  . Mental illness Sister   . Arthritis Maternal Aunt   . Asthma Maternal Aunt   . COPD Maternal Aunt   . Depression Maternal Aunt   . Diabetes Maternal Aunt   . Hypertension Maternal Aunt   . Mental illness Maternal Aunt   . Stroke Maternal Aunt   .  Heart failure Maternal Aunt   . Heart failure Maternal Grandfather   . Cancer Maternal Grandfather   . Diabetes Maternal Grandfather   . Heart disease Maternal Grandfather   . Hypertension Maternal Grandfather   . Hyperlipidemia Maternal Grandfather   . Mental illness Brother   . Diabetes Maternal Grandmother   . Heart disease Maternal Grandmother   . Hypertension Maternal Grandmother   . Hyperlipidemia Maternal Grandmother   . Diabetes Paternal Grandmother   . Heart disease Paternal Grandmother   . Diabetes Paternal Grandfather   . Cancer Paternal Grandfather     Prior to Admission  medications   Medication Sig Start Date End Date Taking? Authorizing Provider  Blood Glucose Monitoring Suppl KIT 1 kit by Does not apply route every morning. 06/26/17   Ivar Drape D, PA  cetirizine (ZYRTEC) 10 MG tablet Take 10 mg by mouth daily as needed for allergies.    [provider]  clindamycin (CLEOCIN) 300 MG capsule Take 1 capsule (300 mg total) by mouth 3 (three) times daily for 7 days. 06/25/18 07/02/18  Joy, Shawn C, PA-C  lidocaine (XYLOCAINE) 2 % solution Use as directed 15 mLs in the mouth or throat as needed for mouth pain. 06/25/18   Joy, Shawn C, PA-C  loperamide (IMODIUM A-D) 2 MG tablet Take 2 mg by mouth 4 (four) times daily as needed for diarrhea or loose stools.    [provider]  metFORMIN (GLUCOPHAGE-XR) 500 MG 24 hr tablet Take 2 tablets (1,000 mg total) by mouth 2 (two) times daily with a meal. 08/14/17   McVey, Gelene Mink, PA-C  metFORMIN (GLUMETZA) 1000 MG (MOD) 24 hr tablet Take 1 tablet (1,000 mg total) by mouth 2 (two) times daily with a meal. 08/13/17   McVey, Gelene Mink, PA-C  ondansetron (ZOFRAN ODT) 4 MG disintegrating tablet Take 1 tablet (4 mg total) by mouth every 8 (eight) hours as needed for nausea or vomiting. 06/25/18   Joy, Helane Gunther, PA-C  promethazine (PHENERGAN) 25 MG tablet Take 1 tablet (25 mg total) by mouth every 6 (six) hours as needed for nausea or vomiting. 06/16/17   Kirichenko, Lahoma Rocker, PA-C  sitaGLIPtin (JANUVIA) 100 MG tablet Take 1 tablet (100 mg total) by mouth daily. Start with half tablet daily first week.  Then increase the the full tablet. 08/13/17   McVey, Gelene Mink, PA-C    Physical Exam: Vitals:   07/02/18 1206 07/02/18 1209 07/02/18 1615  BP:  (!) 159/98 (!) 130/105  Pulse:  (!) 114 (!) 109  Resp:  16   Temp:  98.4 F (36.9 C)   TempSrc:  Temporal   SpO2:  98% 97%  Weight: 120.2 kg    Height: _0  (1.575 m)      Constitutional: Calm , complaining of significant pain to right side of  face Vitals:   07/02/18 1206 07/02/18 1209 07/02/18 1615  BP:  (!) 159/98 (!) 130/105  Pulse:  (!) 114 (!) 109  Resp:  16   Temp:  98.4 F (36.9 C)   TempSrc:  Temporal   SpO2:  98% 97%  Weight: 120.2 kg    Height: _1  (1.575 m)     Eyes: Right eye swollen shut , difficult to pry open, watery- cream coloured secretions on eyelids, conjunctiva clear, pupils equal round reactive to light, reports pain on movement of her eyes downward, ENMT: Mucous membranes are dry. Posterior pharynx clear of any exudate or lesions.  No appreciable swelling inside  her mouth. Neck: normal, supple, no masses, no thyromegaly Respiratory: clear to auscultation bilaterally, no wheezing, no crackles. Normal respiratory effort. No accessory muscle use.  Cardiovascular: Regular rate and rhythm, no murmurs / rubs / gallops. Trace chronic extremity edema. 2+ pedal pulses.   Abdomen: no tenderness, no masses palpated. No hepatosplenomegaly. Bowel sounds positive.  Musculoskeletal: no clubbing / cyanosis. No joint deformity upper and lower extremities. Good ROM, no contractures. Normal muscle tone.  Skin: no rashes, lesions, ulcers. No induration Neurologic: CN 2-12 grossly intact. Strength 5/5 in all 4.  Psychiatric: Normal judgment and insight. Alert and oriented x 3. Normal mood.       Labs on Admission: I have personally reviewed following labs and imaging studies  CBC: Recent Labs  Lab 07/02/18 1336  WBC 17.0*  NEUTROABS 12.6*  HGB 12.4  HCT 37.7  MCV 87.1  PLT 833   Basic Metabolic Panel: Recent Labs  Lab 07/02/18 1336  NA 131*  K 4.3  CL 94*  CO2 27  GLUCOSE 331*  BUN 12  CREATININE 0.74  CALCIUM 8.9    Radiological Exams on Admission: Ct Maxillofacial W Contrast  Result Date: 07/02/2018 CLINICAL DATA:  Dental caries. Recent dental work 06/25/2018. Right facial swelling EXAM: CT MAXILLOFACIAL WITH CONTRAST TECHNIQUE: Multidetector CT imaging of the maxillofacial structures was  performed with intravenous contrast. Multiplanar CT image reconstructions were also generated. CONTRAST:  54m OMNIPAQUE IOHEXOL 300 MG/ML  SOLN COMPARISON:  CT face 04/19/2015 FINDINGS: Osseous: Multiple caries and multiple periventricular bony erosion most notably in the left upper and lower molars. Recent extraction of right upper premolars. Negative for fracture Orbits: Normal orbit bilaterally. Moderate soft tissue swelling over the right eye extending into the right face compatible with cellulitis. Sinuses: Mild mucosal edema in the right maxillary sinus. Remaining sinuses clear Soft tissues: Soft tissue swelling right face diffusely extending down to the mandible. Soft tissue swelling overlying the right orbit. Findings compatible with cellulitis. No soft tissue abscess Scattered small lymph nodes in the neck bilaterally including the right parotid gland compatible with reactive adenopathy. Normal airway Limited intracranial: Negative IMPRESSION: Soft tissue swelling right face and orbit compatible cellulitis. No soft tissue mass. Apparent recent extraction of right upper premolars Poor dentition with multiple caries. Periapical lucencies around left upper and lower molars Electronically Signed   By: CFranchot GalloM.D.   On: 07/02/2018 16:43    EKG: None  Assessment/Plan Active Problems:   Facial cellulitis   Facial cellulitis with periorbital cellulitis- 2/2 tooth extraction, leukocytosis 17, tachycardia to 114.  Patient meets sepsis criteria.  Maxillofacial CT shows soft tissue swelling right face and orbit compatible with cellulitis.  Hold outpatient treatment with p.o. clindamycin. -Considering exam findings, I talked to radiologist on call to confirm that there is no orbital involvement.  -Will start IV vancomycin, IV Levaquin and metronidazole (Penicillin allergy noted, per pharmacy patient has not been exposed to cephalosporins, since documented allergy-anaphylaxis) -CBC BMP a.m. -Check  lactic acid- 1.2 -Obtain blood cultures. -IV Solu-Medrol 80 mg x 1 -PRN ketorolac q8h X 2 doses, tramadol PRN - N/s 100cc/hr x 12 hrs -Requests liquid diet. - UDS negative  HTN-mostly elevated, patient said blood pressure has been elevated over the past week in the 1383ASystolic. - will start Norvasc 546mdaily  DM-glucose 331. Not on home meds - HGba1c in a.m - SSI   Obesity, BMI 48, OSA- NO CPAP with facial vswelling  History of E. coli sepsis 2013, subsequent E.  coli on urine cultures, -sensitive to quinolones, so history of surgical cultures with MRSA.  DVT prophylaxis: Lovenox Code Status: Full Family Communication: None at bedside Disposition Plan: Per rounding team Consults called: None Admission status: Inpt, Tele   Bethena Roys MD [facility]  07/02/2018, 11:23 PM

## 2018-07-02 NOTE — Progress Notes (Signed)
Pharmacy Antibiotic Note  Sheila Austin is a 33 y.o. female admitted on 07/02/2018 with cellulitis.  Pharmacy has been consulted for vancomycin and levaquin dosing.  Plan: Vancomycin 750mg  IV every 12 hours.  Goal trough 10-15 mcg/mL. levofloxacin 750mg  iv q24h  Height: 5\' 2"  (157.5 cm) Weight: 265 lb (120.2 kg) IBW/kg (Calculated) : 50.1  Temp (24hrs), Avg:98.4 F (36.9 C), Min:98.4 F (36.9 C), Max:98.4 F (36.9 C)  Recent Labs  Lab 07/02/18 1336  WBC 17.0*  CREATININE 0.74    Estimated Creatinine Clearance: 126.8 mL/min (by C-G formula based on SCr of 0.74 mg/dL).    Allergies  Allergen Reactions  . Bee Venom Anaphylaxis, Hives, Shortness Of Breath and Swelling  . Penicillins Anaphylaxis    Has patient had a PCN reaction causing immediate rash, facial/tongue/throat swelling, SOB or lightheadedness with hypotension: yes Has patient had a PCN reaction causing severe rash involving mucus membranes or skin necrosis: unknown Has patient had a PCN reaction that required hospitalization : yes Has patient had a PCN reaction occurring within the last 10 years: no If all of the above answers are "NO", then may proceed with Cephalosporin use.     Antimicrobials this admission: 2/26 levaquin >> 2/26 vancomycin >> 2/26 metronidazole>>  Microbiology results: 2/26 BCx: attempting to stick, difficult stick   Thank you for allowing pharmacy to be a part of this patient's care.  Donna Christen Leisl Spurrier 07/02/2018 8:20 PM

## 2018-07-02 NOTE — ED Notes (Signed)
Stuck pt twice, unsuccessful. Lab notified to stick pt for cultures.

## 2018-07-02 NOTE — ED Provider Notes (Addendum)
[facility] EMERGENCY DEPARTMENT Provider Note   CSN: [id number] Arrival date & time: 07/02/18  1159    History   Chief Complaint Chief Complaint  Patient presents with  . Facial Swelling    HPI Sheila Austin is a 33 y.o. female.     Chief complaint persistent swelling and erythema on right cheek.  Patient was seen at the [facility] ER 1 week ago for dental pain.  She was then referred to a dentist on Thursday who pulled 2 teeth in her right upper maxilla area.  She now has worsening swelling and erythema surrounding the right cheek.  Her right eye is swollen shut.  No meningeal signs or mental status changes.  Current Rx oral clindamycin.  Past medical history includes diabetes noncompliant with medications.  Severity of symptoms is moderate to severe nothing makes symptoms better or worse.     Past Medical History:  Diagnosis Date  . Diabetes mellitus   . E. coli sepsis ([place])   . Ectopic pregnancy   . Essential hypertension, benign 11/01/2008  . Fatty liver   . Gastroparesis   . GERD (gastroesophageal reflux disease)   . H. pylori infection   . Helicobacter pylori gastritis 09/07/2011  . Migraine   . Morbid obesity with BMI of 50.0-59.9, adult ([place]) 10/05/2011  . Pulmonary edema   . Pyelonephritis   . Ureteral obstruction     Patient Active Problem List   Diagnosis Date Noted  . Class 3 severe obesity with body mass index (BMI) of 40.0 to 44.9 in adult ([place]) 07/09/2017  . Abscess of right breast 02/24/2016  . BMI 45.0-49.9, adult ([place]) 09/29/2015  . Skin lesions - right lower extremity 12/07/2013  . Morbid obesity with BMI of 50.0-59.9, adult ([place]) 10/05/2011  . Obstructive sleep apnea 10/05/2011  . Hypertriglyceridemia 09/27/2011  . Migraine 09/07/2011  . Essential hypertension, benign 11/01/2008  . Gastroparesis 09/27/2008  . IRREGULAR MENSES 08/24/2008  . Diabetes mellitus out of control ([place]) 07/04/2006    Past Surgical History:  Procedure Laterality Date    . ESOPHAGOGASTRODUODENOSCOPY  2010     OB History    Gravida  1   Para      Term      Preterm      AB  1   Living  0     SAB      TAB      Ectopic  1   Multiple      Live Births               Home Medications    Prior to Admission medications   Medication Sig Start Date End Date Taking? Authorizing Provider  Blood Glucose Monitoring Suppl KIT 1 kit by Does not apply route every morning. 06/26/17   Ivar Drape D, PA  cetirizine (ZYRTEC) 10 MG tablet Take 10 mg by mouth daily as needed for allergies.    [provider]  clindamycin (CLEOCIN) 300 MG capsule Take 1 capsule (300 mg total) by mouth 3 (three) times daily for 7 days. 06/25/18 07/02/18  Joy, Shawn C, PA-C  lidocaine (XYLOCAINE) 2 % solution Use as directed 15 mLs in the mouth or throat as needed for mouth pain. 06/25/18   Joy, Shawn C, PA-C  loperamide (IMODIUM A-D) 2 MG tablet Take 2 mg by mouth 4 (four) times daily as needed for diarrhea or loose stools.    [provider]  metFORMIN (GLUCOPHAGE-XR) 500 MG 24 hr tablet Take  2 tablets (1,000 mg total) by mouth 2 (two) times daily with a meal. 08/14/17   McVey, Gelene Mink, PA-C  metFORMIN (GLUMETZA) 1000 MG (MOD) 24 hr tablet Take 1 tablet (1,000 mg total) by mouth 2 (two) times daily with a meal. 08/13/17   McVey, Gelene Mink, PA-C  ondansetron (ZOFRAN ODT) 4 MG disintegrating tablet Take 1 tablet (4 mg total) by mouth every 8 (eight) hours as needed for nausea or vomiting. 06/25/18   Joy, Helane Gunther, PA-C  promethazine (PHENERGAN) 25 MG tablet Take 1 tablet (25 mg total) by mouth every 6 (six) hours as needed for nausea or vomiting. 06/16/17   Kirichenko, Lahoma Rocker, PA-C  sitaGLIPtin (JANUVIA) 100 MG tablet Take 1 tablet (100 mg total) by mouth daily. Start with half tablet daily first week.  Then increase the the full tablet. 08/13/17   McVey, Gelene Mink, PA-C    Family History Family History  Problem Relation Age of Onset   . Arthritis Mother   . Asthma Mother   . COPD Mother   . Depression Mother   . Diabetes Mother   . Hypertension Mother   . Mental illness Mother   . Kidney disease Mother   . Crohn's disease Mother   . Aneurysm Father 22       brain  . Mental illness Sister   . Arthritis Maternal Aunt   . Asthma Maternal Aunt   . COPD Maternal Aunt   . Depression Maternal Aunt   . Diabetes Maternal Aunt   . Hypertension Maternal Aunt   . Mental illness Maternal Aunt   . Stroke Maternal Aunt   . Heart failure Maternal Aunt   . Heart failure Maternal Grandfather   . Cancer Maternal Grandfather   . Diabetes Maternal Grandfather   . Heart disease Maternal Grandfather   . Hypertension Maternal Grandfather   . Hyperlipidemia Maternal Grandfather   . Mental illness Brother   . Diabetes Maternal Grandmother   . Heart disease Maternal Grandmother   . Hypertension Maternal Grandmother   . Hyperlipidemia Maternal Grandmother   . Diabetes Paternal Grandmother   . Heart disease Paternal Grandmother   . Diabetes Paternal Grandfather   . Cancer Paternal Grandfather     Social History Social History   Tobacco Use  . Smoking status: Never Smoker  . Smokeless tobacco: Never Used  Substance Use Topics  . Alcohol use: Yes    Comment: occasional  . Drug use: No     Allergies   Bee venom and Penicillins   Review of Systems Review of Systems  All other systems reviewed and are negative.    Physical Exam Updated Vital Signs BP (!) 130/105   Pulse (!) 109   Temp 98.4 F (36.9 C) (Temporal)   Resp 16   Ht 5' 2"  (1.575 m)   Wt 120.2 kg   LMP 05/31/2018   SpO2 97%   BMI 48.47 kg/m   Physical Exam Vitals signs and nursing note reviewed.  Constitutional:      Appearance: She is well-developed.  HENT:     Head: Normocephalic.     Comments: Obvious edema and erythema on the right cheek with associated eyelid edema Eyes:     Conjunctiva/sclera: Conjunctivae normal.  Neck:      Musculoskeletal: Neck supple.  Cardiovascular:     Rate and Rhythm: Normal rate and regular rhythm.  Pulmonary:     Effort: Pulmonary effort is normal.     Breath sounds: Normal breath  sounds.  Abdominal:     General: Bowel sounds are normal.     Palpations: Abdomen is soft.  Musculoskeletal: Normal range of motion.  Skin:    General: Skin is warm and dry.  Neurological:     Mental Status: She is alert and oriented to person, place, and time.  Psychiatric:        Behavior: Behavior normal.      ED Treatments / Results  Labs (all labs ordered are listed, but only abnormal results are displayed) Labs Reviewed  BASIC METABOLIC PANEL - Abnormal; Notable for the following components:      Result Value   Sodium 131 (*)    Chloride 94 (*)    Glucose, Bld 331 (*)    All other components within normal limits  CBC WITH DIFFERENTIAL/PLATELET - Abnormal; Notable for the following components:   WBC 17.0 (*)    Neutro Abs 12.6 (*)    Monocytes Absolute 1.3 (*)    Abs Immature Granulocytes 0.09 (*)    All other components within normal limits  PREGNANCY, URINE    EKG None  Radiology Ct Maxillofacial W Contrast  Result Date: 07/02/2018 CLINICAL DATA:  Dental caries. Recent dental work 06/25/2018. Right facial swelling EXAM: CT MAXILLOFACIAL WITH CONTRAST TECHNIQUE: Multidetector CT imaging of the maxillofacial structures was performed with intravenous contrast. Multiplanar CT image reconstructions were also generated. CONTRAST:  72m OMNIPAQUE IOHEXOL 300 MG/ML  SOLN COMPARISON:  CT face 04/19/2015 FINDINGS: Osseous: Multiple caries and multiple periventricular bony erosion most notably in the left upper and lower molars. Recent extraction of right upper premolars. Negative for fracture Orbits: Normal orbit bilaterally. Moderate soft tissue swelling over the right eye extending into the right face compatible with cellulitis. Sinuses: Mild mucosal edema in the right maxillary sinus.  Remaining sinuses clear Soft tissues: Soft tissue swelling right face diffusely extending down to the mandible. Soft tissue swelling overlying the right orbit. Findings compatible with cellulitis. No soft tissue abscess Scattered small lymph nodes in the neck bilaterally including the right parotid gland compatible with reactive adenopathy. Normal airway Limited intracranial: Negative IMPRESSION: Soft tissue swelling right face and orbit compatible cellulitis. No soft tissue mass. Apparent recent extraction of right upper premolars Poor dentition with multiple caries. Periapical lucencies around left upper and lower molars Electronically Signed   By: CFranchot GalloM.D.   On: 07/02/2018 16:43    Procedures Procedures (including critical care time)  Medications Ordered in ED Medications  sodium chloride 0.9 % bolus 1,000 mL (1,000 mLs Intravenous New Bag/Given 07/02/18 1613)  clindamycin (CLEOCIN) IVPB 600 mg (600 mg Intravenous New Bag/Given 07/02/18 1613)  fentaNYL (SUBLIMAZE) injection 50 mcg (50 mcg Intravenous Given 07/02/18 1612)  iohexol (OMNIPAQUE) 300 MG/ML solution 75 mL (75 mLs Intravenous Contrast Given 07/02/18 1548)     Initial Impression / Assessment and Plan / ED Course  I have reviewed the triage vital signs and the nursing notes.  Pertinent labs & imaging results that were available during my care of the patient were reviewed by me and considered in my medical decision making (see chart for details).        Patient has obvious soft tissue infection of her right cheek secondary to dental infection.  Her glucose is elevated.  Will hydrate and start IV clindamycin.  Admit to general medicine.   CRITICAL CARE Performed by: BNat ChristenTotal critical care time: 30 minutes Critical care time was exclusive of separately billable procedures and treating other patients.  Critical care was necessary to treat or prevent imminent or life-threatening deterioration. Critical care was  time spent personally by me on the following activities: development of treatment plan with patient and/or surrogate as well as nursing, discussions with consultants, evaluation of patient's response to treatment, examination of patient, obtaining history from patient or surrogate, ordering and performing treatments and interventions, ordering and review of laboratory studies, ordering and review of radiographic studies, pulse oximetry and re-evaluation of patient's condition. Final Clinical Impressions(s) / ED Diagnoses   Final diagnoses:  Facial cellulitis    ED Discharge Orders    None       Nat Christen, MD 07/02/18 1656    Nat Christen, MD 07/14/18 [phone]

## 2018-07-02 NOTE — ED Notes (Signed)
Notified Emokpae, MD of patient's BP

## 2018-07-03 DIAGNOSIS — I10 Essential (primary) hypertension: Secondary | ICD-10-CM

## 2018-07-03 DIAGNOSIS — Z6841 Body Mass Index (BMI) 40.0 and over, adult: Secondary | ICD-10-CM

## 2018-07-03 DIAGNOSIS — E1165 Type 2 diabetes mellitus with hyperglycemia: Secondary | ICD-10-CM

## 2018-07-03 LAB — BASIC METABOLIC PANEL
Anion gap: 11 (ref 5–15)
BUN: 17 mg/dL (ref 6–20)
CHLORIDE: 100 mmol/L (ref 98–111)
CO2: 21 mmol/L — ABNORMAL LOW (ref 22–32)
CREATININE: 0.71 mg/dL (ref 0.44–1.00)
Calcium: 8.2 mg/dL — ABNORMAL LOW (ref 8.9–10.3)
GFR calc Af Amer: >60 mL/min (ref >60)
GFR calc non Af Amer: >60 mL/min (ref >60)
Glucose, Bld: 336 mg/dL — ABNORMAL HIGH (ref 70–99)
Potassium: 4.1 mmol/L (ref 3.5–5.1)
Sodium: 132 mmol/L — ABNORMAL LOW (ref 135–145)

## 2018-07-03 LAB — GLUCOSE, CAPILLARY
GLUCOSE-CAPILLARY: 139 mg/dL — AB (ref 70–99)
GLUCOSE-CAPILLARY: 382 mg/dL — AB (ref 70–99)
Glucose-Capillary: 188 mg/dL — ABNORMAL HIGH (ref 70–99)
Glucose-Capillary: 217 mg/dL — ABNORMAL HIGH (ref 70–99)
Glucose-Capillary: 248 mg/dL — ABNORMAL HIGH (ref 70–99)
Glucose-Capillary: 349 mg/dL — ABNORMAL HIGH (ref 70–99)
Glucose-Capillary: 375 mg/dL — ABNORMAL HIGH (ref 70–99)

## 2018-07-03 LAB — CBC
HEMATOCRIT: 32.6 % — AB (ref 36.0–46.0)
Hemoglobin: 10.6 g/dL — ABNORMAL LOW (ref 12.0–15.0)
MCH: 28.3 pg (ref 26.0–34.0)
MCHC: 32.5 g/dL (ref 30.0–36.0)
MCV: 86.9 fL (ref 80.0–100.0)
Platelets: 312 10*3/uL (ref 150–400)
RBC: 3.75 MIL/uL — ABNORMAL LOW (ref 3.87–5.11)
RDW: 12.3 % (ref 11.5–15.5)
WBC: 14.7 10*3/uL — ABNORMAL HIGH (ref 4.0–10.5)
nRBC: 0 % (ref 0.0–0.2)

## 2018-07-03 LAB — HEMOGLOBIN A1C
Hgb A1c MFr Bld: 13.6 % — ABNORMAL HIGH (ref 4.8–5.6)
Mean Plasma Glucose: 343.62 mg/dL

## 2018-07-03 MED ORDER — LIVING WELL WITH DIABETES BOOK
Freq: Once | Status: AC
  Administered 2018-07-03: 16:00:00

## 2018-07-03 MED ORDER — SODIUM CHLORIDE 0.9 % IV SOLN
INTRAVENOUS | Status: AC
  Administered 2018-07-03 – 2018-07-04 (×2): via INTRAVENOUS

## 2018-07-03 MED ORDER — INSULIN GLARGINE 100 UNIT/ML ~~LOC~~ SOLN
30.0000 [IU] | Freq: Every day | SUBCUTANEOUS | Status: DC
  Administered 2018-07-03 – 2018-07-04 (×2): 30 [IU] via SUBCUTANEOUS
  Filled 2018-07-03 (×5): qty 0.3

## 2018-07-03 MED ORDER — KETOROLAC TROMETHAMINE 30 MG/ML IJ SOLN
30.0000 mg | Freq: Four times a day (QID) | INTRAMUSCULAR | Status: AC
  Administered 2018-07-03 – 2018-07-05 (×8): 30 mg via INTRAVENOUS
  Filled 2018-07-03 (×8): qty 1

## 2018-07-03 MED ORDER — INSULIN STARTER KIT- PEN NEEDLES (ENGLISH)
1.0000 | Freq: Once | Status: AC
  Administered 2018-07-03: 1
  Filled 2018-07-03: qty 1

## 2018-07-03 MED ORDER — SODIUM CHLORIDE 0.9 % IV SOLN: INTRAVENOUS | Status: DC

## 2018-07-03 NOTE — Progress Notes (Signed)
PROGRESS NOTE    Sheila Austin  VWU:[id number] DOB: 06-Apr-1986 DOA: 07/02/2018 PCP: Patient, No Pcp Per    Brief Narrative:  33 year old female with history of diabetes and hypertension, presented with right facial swelling.  She recently had 2 teeth extracted on the right side approximately 1 week prior to admission.  She was prescribed a course of clindamycin and reported compliance with this.  She was found to have significant cellulitis.  CT maxillofacial did not indicate underlying abscess.  She was admitted for IV antibiotics.   Assessment & Plan:   Active Problems:   Diabetes mellitus out of control [facility])   Essential hypertension, benign   BMI 45.0-49.9, adult ([facility])   Facial cellulitis   1. Right facial cellulitis.  Recently had teeth removed on the same side.  CT maxillofacial does not indicate any underlying abscess.  Overall swelling and erythema is improving since admission with IV antibiotics, but still has substantial symptoms.  Needs continued IV antibiotic therapy. 2. Diabetes, uncontrolled, with hyperglycemia.  A1c of 13.6.  Will start on Lantus.  Continue on sliding scale insulin.  Diabetes coordinator consult. 3. Hypertension.  Continue on Norvasc.  Blood pressure currently stable. 4. Morbid obesity.  Counseled on the importance of diet and exercise.   DVT prophylaxis: Lovenox Code Status: Full code Family Communication: No family present Disposition Plan: Discharge home once improved   Consultants:     Procedures:     Antimicrobials:   Vancomycin 2/26>  Flagyl 2/26>  Levofloxacin 2/26 >   Subjective: Feels that swelling is improving.  Able to eat soft foods.  Not back to baseline.  Objective: Vitals:   07/02/18 2310 07/03/18 0000 07/03/18 0041 07/03/18 0413  BP:  (!) 146/98 (!) 152/102 138/84  Pulse:  96 99 86  Resp:  18  16  Temp: 99 F (37.2 C)  98.6 F (37 C) 98 F (36.7 C)  TempSrc: Oral  Oral Oral  SpO2:  96% 99% 97%  Weight:    118.3 kg   Height:   5' 2"  (1.575 m)     Intake/Output Summary (Last 24 hours) at 07/03/2018 1531 Last data filed at 07/03/2018 0641 Gross per 24 hour  Intake 2312.2 ml  Output -  Net 2312.2 ml   Filed Weights   07/02/18 1206 07/03/18 0041  Weight: 120.2 kg 118.3 kg    Examination:  General exam: Appears calm and comfortable  HEENT: Significant swelling and erythema over right face.  Area of induration of her right maxillary sinus Respiratory system: Clear to auscultation. Respiratory effort normal. Cardiovascular system: S1 & S2 heard, RRR. No JVD, murmurs, rubs, gallops or clicks. No pedal edema. Gastrointestinal system: Abdomen is nondistended, soft and nontender. No organomegaly or masses felt. Normal bowel sounds heard. Central nervous system: Alert and oriented. No focal neurological deficits. Extremities: Symmetric 5 x 5 power. Skin: No rashes, lesions or ulcers Psychiatry: Judgement and insight appear normal. Mood & affect appropriate.     Data Reviewed: I have personally reviewed following labs and imaging studies  CBC: Recent Labs  Lab 07/02/18 1336 07/03/18 0617  WBC 17.0* 14.7*  NEUTROABS 12.6*  --   HGB 12.4 10.6*  HCT 37.7 32.6*  MCV 87.1 86.9  PLT 358 295   Basic Metabolic Panel: Recent Labs  Lab 07/02/18 1336 07/03/18 0617  NA 131* 132*  K 4.3 4.1  CL 94* 100  CO2 27 21*  GLUCOSE 331* 336*  BUN 12 17  CREATININE 0.74 0.71  CALCIUM 8.9 8.2*   GFR: Estimated Creatinine Clearance: 125.6 mL/min (by C-G formula based on SCr of 0.71 mg/dL). Liver Function Tests: No results for input(s): AST, ALT, ALKPHOS, BILITOT, PROT, ALBUMIN in the last 168 hours. No results for input(s): LIPASE, AMYLASE in the last 168 hours. No results for input(s): AMMONIA in the last 168 hours. Coagulation Profile: No results for input(s): INR, PROTIME in the last 168 hours. Cardiac Enzymes: No results for input(s): CKTOTAL, CKMB, CKMBINDEX, TROPONINI in the last 168  hours. BNP (last 3 results) No results for input(s): PROBNP in the last 8760 hours. HbA1C: Recent Labs    07/03/18 0617  HGBA1C 13.6*   CBG: Recent Labs  Lab 07/02/18 2339 07/03/18 0412 07/03/18 0850 07/03/18 1205  GLUCAP 348* 349* 375* 248*   Lipid Profile: No results for input(s): CHOL, HDL, LDLCALC, TRIG, CHOLHDL, LDLDIRECT in the last 72 hours. Thyroid Function Tests: No results for input(s): TSH, T4TOTAL, FREET4, T3FREE, THYROIDAB in the last 72 hours. Anemia Panel: No results for input(s): VITAMINB12, FOLATE, FERRITIN, TIBC, IRON, RETICCTPCT in the last 72 hours. Sepsis Labs: Recent Labs  Lab 07/02/18 2050  LATICACIDVEN 1.2    Recent Results (from the past 240 hour(s))  Culture, blood (routine x 2)     Status: None (Preliminary result)   Collection Time: 07/02/18  6:41 PM  Result Value Ref Range Status   Specimen Description RIGHT ANTECUBITAL  Final   Special Requests   Final    BOTTLES DRAWN AEROBIC AND ANAEROBIC Blood Culture adequate volume   Culture   Final    NO GROWTH < 12 HOURS Performed at [facility], [address], [place], [place] [address]    Report Status PENDING  Incomplete  Culture, blood (routine x 2)     Status: None (Preliminary result)   Collection Time: 07/02/18  6:42 PM  Result Value Ref Range Status   Specimen Description BLOOD RIGHT HAND  Final   Special Requests   Final    BOTTLES DRAWN AEROBIC AND ANAEROBIC Blood Culture adequate volume   Culture   Final    NO GROWTH < 12 HOURS Performed at [facility], [address] [place], [place] [address]    Report Status PENDING  Incomplete         Radiology Studies: Ct Maxillofacial W Contrast  Result Date: 07/02/2018 CLINICAL DATA:  Dental caries. Recent dental work 06/25/2018. Right facial swelling EXAM: CT MAXILLOFACIAL WITH CONTRAST TECHNIQUE: Multidetector CT imaging of the maxillofacial structures was performed with intravenous contrast. Multiplanar CT image reconstructions  were also generated. CONTRAST:  58m OMNIPAQUE IOHEXOL 300 MG/ML  SOLN COMPARISON:  CT face 04/19/2015 FINDINGS: Osseous: Multiple caries and multiple periventricular bony erosion most notably in the left upper and lower molars. Recent extraction of right upper premolars. Negative for fracture Orbits: Normal orbit bilaterally. Moderate soft tissue swelling over the right eye extending into the right face compatible with cellulitis. Sinuses: Mild mucosal edema in the right maxillary sinus. Remaining sinuses clear Soft tissues: Soft tissue swelling right face diffusely extending down to the mandible. Soft tissue swelling overlying the right orbit. Findings compatible with cellulitis. No soft tissue abscess Scattered small lymph nodes in the neck bilaterally including the right parotid gland compatible with reactive adenopathy. Normal airway Limited intracranial: Negative IMPRESSION: Soft tissue swelling right face and orbit compatible cellulitis. No soft tissue mass. Apparent recent extraction of right upper premolars Poor dentition with multiple caries. Periapical lucencies around left upper and lower molars Electronically Signed  By: Franchot Gallo M.D.   On: 07/02/2018 16:43        Scheduled Meds: . amLODipine  5 mg Oral Daily  . enoxaparin (LOVENOX) injection  0.5 mg/kg Subcutaneous Q24H  . insulin aspart  0-15 Units Subcutaneous Q4H  . insulin glargine  30 Units Subcutaneous Daily  . insulin starter kit- pen needles  1 kit Other Once  . ketorolac  30 mg Intravenous Q6H  . living well with diabetes book   Does not apply Once   Continuous Infusions: . sodium chloride    . levofloxacin (LEVAQUIN) IV Stopped (07/02/18 2239)  . metronidazole 500 mg (07/03/18 1001)  . vancomycin 750 mg (07/03/18 1252)     LOS: 1 day    Time spent: 23mns    JKathie Dike MD [facility]   If 7PM-7AM, please contact night-coverage [email or web address]  07/03/2018, 3:31 PM

## 2018-07-03 NOTE — Progress Notes (Signed)
Inpatient Diabetes Program Recommendations  AACE/ADA: New Consensus Statement on Inpatient Glycemic Control  Target Ranges:  Prepandial:   less than 140 mg/dL      Peak postprandial:   less than 180 mg/dL (1-2 hours)      Critically ill patients:  140 - 180 mg/dL   Results for MARLISHA, VANWYK (MRN [id number]) as of 07/03/2018 15:13  Ref. Range 07/02/2018 23:39 07/03/2018 04:12 07/03/2018 08:50 07/03/2018 12:05  Glucose-Capillary Latest Ref Range: 70 - 99 mg/dL 348 (H) 349 (H) 375 (H) 248 (H)    Review of Glycemic Control  Diabetes history: DM2 Outpatient Diabetes medications: None in years (has been on orals and insulin in the past) Current orders for Inpatient glycemic control: Novolog 0-15 units Q4H  Inpatient Diabetes Program Recommendations:  Insulin - Basal: Please consider ordering Lantus 25 units Q24H (based on 118.3 kg x 0.2 units).  Insulin - Meal Coverage: Please consider ordering Novolog 5 units TID with meals for meal coverage if patient eats at least 50% of meals.  HgbA1C: A1C 13.6% on 07/03/18 indicating an average glcuose of 344 mg/dl over the past 2-3 months.  NOTE: Spoke with patient about diabetes and home regimen for diabetes control. Patient reports that she does not have a PCP at this time.  Patient states that has not been on DM medications in years. Patient notes that her mother had cancer and she has been her caretaker for several years and she has put her own personal health to the side.   Patient states that her mother passed away in 06-27-22 and she knows she has to take better care of herself.  Patient states that she took oral DM medications as well as insulin in the past. Patient also gave her mother insulin injections with vial/syringe and insulin pens so she has a good understanding of how to administer insulin. 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   Discussed A1C results (13.6% on 07/03/18) and explained that her current A1C  indicates an average glucose of 344 mg/dl over the past 2-3 months. Discussed glucose and A1C goals. Discussed importance of checking CBGs and maintaining good CBG control to prevent long-term and short-term complications. Explained how hyperglycemia leads to damage within blood vessels which lead to the common complications seen with uncontrolled diabetes. Stressed to the patient the importance of improving glycemic control to prevent further complications from uncontrolled diabetes. Discussed impact of nutrition, exercise, stress, sickness, and medications on diabetes control. Informed patient she will be receiving a Living Well with DM book, an insulin starter kit. Encouraged patient to read over information once received.  Patient is willing to take insulin as an outpatient again and she plans to call her prior PCP to see if she can re-establish care there.  Patient is not currently participating in [facility] so patient will need to sign up with the program and will likely have a waiting period before she can get DM medications free. Plan to check with [facility] to find out more information to get a better idea of what insulins will need to be prescribed at discharge.  Patient expressed appreciation for information discussed and she realizes that she has to get DM under control.  Informed patient that RNs will be asking her to self inject insulin while inpatient to ensure proper technique is being used.  Patient verbalized understanding of information discussed and she states that she has no further questions at this time related to  diabetes.  Thanks, Barnie Alderman, RN, MSN, CDE Diabetes Coordinator Inpatient Diabetes Program [phone] (Team Pager)

## 2018-07-03 NOTE — Care Management Note (Signed)
Case Management Note  Patient Details  Name: ATLANTA PELTO MRN: [id number] Date of Birth: [birth year]  Subjective/Objective:    Admitted with cellulitis. Pt from home, ind. Has insurance but no PCP. Employed as Furniture conservator/restorer.       Action/Plan: DC home with self care. CM provided contact info for Patient Engagement Center. Pt will call and request assistance finding PCP.   Sherald Barge, RN 07/03/2018, 3:43 PM

## 2018-07-04 LAB — GLUCOSE, CAPILLARY
GLUCOSE-CAPILLARY: 246 mg/dL — AB (ref 70–99)
Glucose-Capillary: 252 mg/dL — ABNORMAL HIGH (ref 70–99)
Glucose-Capillary: 192 mg/dL — ABNORMAL HIGH (ref 70–99)
Glucose-Capillary: 276 mg/dL — ABNORMAL HIGH (ref 70–99)
Glucose-Capillary: 263 mg/dL — ABNORMAL HIGH (ref 70–99)
Glucose-Capillary: 346 mg/dL — ABNORMAL HIGH (ref 70–99)

## 2018-07-04 MED ORDER — CLINDAMYCIN PHOSPHATE 600 MG/50ML IV SOLN
600.0000 mg | Freq: Three times a day (TID) | INTRAVENOUS | Status: DC
  Administered 2018-07-04 – 2018-07-08 (×11): 600 mg via INTRAVENOUS
  Filled 2018-07-04 (×11): qty 50

## 2018-07-04 MED ORDER — INSULIN GLARGINE 100 UNIT/ML ~~LOC~~ SOLN
40.0000 [IU] | Freq: Every day | SUBCUTANEOUS | Status: DC
  Administered 2018-07-05 – 2018-07-07 (×3): 40 [IU] via SUBCUTANEOUS
  Filled 2018-07-04 (×6): qty 0.4

## 2018-07-04 MED ORDER — ENSURE MAX PROTEIN PO LIQD
11.0000 [oz_av] | Freq: Every day | ORAL | Status: DC
  Administered 2018-07-04 – 2018-07-07 (×4): 11 [oz_av] via ORAL

## 2018-07-04 NOTE — Progress Notes (Signed)
PROGRESS NOTE    ZAHLIA DESHAZER  MGQ:[id number] DOB: 05/26/1985 DOA: 07/02/2018 PCP: Patient, No Pcp Per    Brief Narrative:  33 year old female with history of diabetes and hypertension, presented with right facial swelling.  She recently had 2 teeth extracted on the right side approximately 1 week prior to admission.  She was prescribed a course of clindamycin and reported compliance with this.  She was found to have significant cellulitis.  CT maxillofacial did not indicate underlying abscess.  She was admitted for IV antibiotics.   Assessment & Plan:   Active Problems:   Diabetes mellitus out of control [facility])   Essential hypertension, benign   BMI 45.0-49.9, adult (HCC)   Facial cellulitis   1. Right facial cellulitis.  Recently had teeth removed on the same side.  CT maxillofacial does not indicate any underlying abscess.  Overall swelling and erythema have mildly improved since admission, but she continues to have significant swelling and induration over her right face.  Will change antibiotics to single agent clindamycin. 2. Diabetes, uncontrolled, with hyperglycemia.  A1c of 13.6.  Blood sugars remain elevated.  Will increase Lantus.  Continue on sliding scale insulin 3. Hypertension.  Continue on Norvasc.  Blood pressure currently stable. 4. Morbid obesity.  Counseled on the importance of diet and exercise.   DVT prophylaxis: Lovenox Code Status: Full code Family Communication: No family present Disposition Plan: Discharge home once improved   Consultants:     Procedures:     Antimicrobials:   Vancomycin 2/26> 2/28  Flagyl 2/26> 2/28  Levofloxacin 2/26 > 2/28  Clindamycin 2/28 >   Subjective: Feels that swelling on right face is about the same as it was yesterday.  Continues to feel tender.  Objective: Vitals:   07/03/18 0413 07/03/18 1707 07/03/18 2109 07/04/18 0436  BP: 138/84 139/89 (!) 155/96 127/72  Pulse: 86 (!) 113 (!) 102 (!) 105  Resp: 16  18 20 18   Temp: 98 F (36.7 C) 98.3 F (36.8 C) 98.6 F (37 C) 99.1 F (37.3 C)  TempSrc: Oral Oral Oral Oral  SpO2: 97% 100% 100% 98%  Weight:      Height:        Intake/Output Summary (Last 24 hours) at 07/04/2018 1923 Last data filed at 07/04/2018 1300 Gross per 24 hour  Intake 2772.19 ml  Output -  Net 2772.19 ml   Filed Weights   07/02/18 1206 07/03/18 0041  Weight: 120.2 kg 118.3 kg    Examination:  General exam: Alert, awake, oriented x 3 HEENT: Continues to have significant swelling and erythema over right side of face with induration over maxillary sinus. Respiratory system: Clear to auscultation. Respiratory effort normal. Cardiovascular system:RRR. No murmurs, rubs, gallops. Gastrointestinal system: Abdomen is nondistended, soft and nontender. No organomegaly or masses felt. Normal bowel sounds heard. Central nervous system: Alert and oriented. No focal neurological deficits. Extremities: No C/C/E, +pedal pulses Skin: No rashes, lesions or ulcers Psychiatry: Judgement and insight appear normal. Mood & affect appropriate.      Data Reviewed: I have personally reviewed following labs and imaging studies  CBC: Recent Labs  Lab 07/02/18 1336 07/03/18 0617  WBC 17.0* 14.7*  NEUTROABS 12.6*  --   HGB 12.4 10.6*  HCT 37.7 32.6*  MCV 87.1 86.9  PLT 358 267   Basic Metabolic Panel: Recent Labs  Lab 07/02/18 1336 07/03/18 0617  NA 131* 132*  K 4.3 4.1  CL 94* 100  CO2 27 21*  GLUCOSE 331*  336*  BUN 12 17  CREATININE 0.74 0.71  CALCIUM 8.9 8.2*   GFR: Estimated Creatinine Clearance: 125.6 mL/min (by C-G formula based on SCr of 0.71 mg/dL). Liver Function Tests: No results for input(s): AST, ALT, ALKPHOS, BILITOT, PROT, ALBUMIN in the last 168 hours. No results for input(s): LIPASE, AMYLASE in the last 168 hours. No results for input(s): AMMONIA in the last 168 hours. Coagulation Profile: No results for input(s): INR, PROTIME in the last 168  hours. Cardiac Enzymes: No results for input(s): CKTOTAL, CKMB, CKMBINDEX, TROPONINI in the last 168 hours. BNP (last 3 results) No results for input(s): PROBNP in the last 8760 hours. HbA1C: Recent Labs    07/03/18 0617  HGBA1C 13.6*   CBG: Recent Labs  Lab 07/03/18 2336 07/04/18 0350 07/04/18 0929 07/04/18 1121 07/04/18 1641  GLUCAP 217* 252* 192* 276* 263*   Lipid Profile: No results for input(s): CHOL, HDL, LDLCALC, TRIG, CHOLHDL, LDLDIRECT in the last 72 hours. Thyroid Function Tests: No results for input(s): TSH, T4TOTAL, FREET4, T3FREE, THYROIDAB in the last 72 hours. Anemia Panel: No results for input(s): VITAMINB12, FOLATE, FERRITIN, TIBC, IRON, RETICCTPCT in the last 72 hours. Sepsis Labs: Recent Labs  Lab 07/02/18 2050  LATICACIDVEN 1.2    Recent Results (from the past 240 hour(s))  Culture, blood (routine x 2)     Status: None (Preliminary result)   Collection Time: 07/02/18  6:41 PM  Result Value Ref Range Status   Specimen Description RIGHT ANTECUBITAL  Final   Special Requests   Final    BOTTLES DRAWN AEROBIC AND ANAEROBIC Blood Culture adequate volume   Culture   Final    NO GROWTH 2 DAYS Performed at [facility], [address], [place], [place] [address]    Report Status PENDING  Incomplete  Culture, blood (routine x 2)     Status: None (Preliminary result)   Collection Time: 07/02/18  6:42 PM  Result Value Ref Range Status   Specimen Description BLOOD RIGHT HAND  Final   Special Requests   Final    BOTTLES DRAWN AEROBIC AND ANAEROBIC Blood Culture adequate volume   Culture   Final    NO GROWTH 2 DAYS Performed at [facility], [address] [place], [place] [address]    Report Status PENDING  Incomplete         Radiology Studies: No results found.      Scheduled Meds: . amLODipine  5 mg Oral Daily  . enoxaparin (LOVENOX) injection  0.5 mg/kg Subcutaneous Q24H  . insulin aspart  0-15 Units Subcutaneous Q4H  . [START ON  07/05/2018] insulin glargine  40 Units Subcutaneous Daily  . ketorolac  30 mg Intravenous Q6H  . ENSURE MAX PROTEIN  11 oz Oral Daily   Continuous Infusions: . levofloxacin (LEVAQUIN) IV 750 mg (07/03/18 2055)  . metronidazole 500 mg (07/04/18 1815)  . vancomycin 750 mg (07/04/18 1231)     LOS: 2 days    Time spent: 77mins    Kathie Dike, MD [facility]   If 7PM-7AM, please contact night-coverage [email or web address]  07/04/2018, 7:23 PM

## 2018-07-04 NOTE — Progress Notes (Addendum)
Inpatient Diabetes Program Recommendations  AACE/ADA: New Consensus Statement on Inpatient Glycemic Control  Target Ranges:  Prepandial:   less than 140 mg/dL      Peak postprandial:   less than 180 mg/dL (1-2 hours)      Critically ill patients:  140 - 180 mg/dL   Results for Sheila Austin, Sheila Austin (MRN [id number]) as of 07/04/2018 07:31  Ref. Range 07/03/2018 08:50 07/03/2018 12:05 07/03/2018 16:14 07/03/2018 17:09 07/03/2018 20:48 07/03/2018 23:36 07/04/2018 03:50  Glucose-Capillary Latest Ref Range: 70 - 99 mg/dL 375 (H) 248 (H) 139 (H) 188 (H) 382 (H) 217 (H) 252 (H)  Results for Sheila Austin, Sheila Austin (MRN [id number]) as of 07/04/2018 07:31  Ref. Range 07/03/2018 06:17  Hemoglobin A1C Latest Ref Range: 4.8 - 5.6 % 13.6 (H)   Review of Glycemic Control  Diabetes history: DM2 Outpatient Diabetes medications: None Current orders for Inpatient glycemic control: Lantus 30 units daily, Novolog 0-15 units Q4H  Inpatient Diabetes Program Recommendations:   Insulin - Basal: Please consider ordering Lantus 40 units daily.  Insulin - Meal Coverage: Please consider ordering Novolog 6 units TID with meals for meal coverage if patient eats at least 50% of meals.  HgbA1C: A1C 13.6% on 07/03/18 indicating an average glcuose of 344 mg/dl over the past 2-3 months.  NOTE: Spoke with [facility] to see what insulins patient can get from there as an employee that is not in the My Active Health program yet. Patient can go online and sign up for Lantus savings card and will be able to get Lantus for free (with the savings card) and will be able to get Humalog for $30.  Spoke with patient over the phone to let her know that she can get Lantus and Humalog at the [facility]. Will ask MD to see if prescriptions can be sent over so that patient's boyfriend can go pick up medications and supplies today or tomorrow (pharmacy open from 10am to 4pm) in case patient gets discharged over the weekend.   Please provide Rx for: glucometer & testing supplies (#[id number]), Lantus Solostar pen [phone]), Humalog Kwikpen [phone]), and insulin pen needles [phone]).  Thanks, Barnie Alderman, RN, MSN, CDE Diabetes Coordinator Inpatient Diabetes Program [phone] (Team Pager from 8am to 5pm)

## 2018-07-04 NOTE — Plan of Care (Signed)
Nutrition Education Note  RD consulted for nutrition education regarding diabetes.   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 She says during this time she did not take her medications or even attempt to follow a DM diet. Her mother passed away in June 24, 2022 and she says she is now ready to get back to focusing on her own health.   DIET RECALL Breakfast: Skips ("I get sick if I eat breakfast") Lunch: (12-1 pm) [employer] typically + lemonade Dinner: Full course meal- she gives example of green beans, potato and piece of meat Beverages: Water, coffee, Reg Soda, Lemonade, occasionally juice Snacks: None. Doesn't snack. Behaviors: Skips breakfast. Report occasionally "pondering" nutrition labels Social: she says her fiance does the shopping and he "only drinks mt dew"- she says her house is largely stocked with regular soda and she often finds herself drinking these because there is nothing else.   First and foremost, RD noted it was essential she eat in the morning, especially if she going to be on insulin. Explained the risks of hypoglycemia if she is taking insulin, but not eating. She understands this, but says she isnt hungry. RD explained her body has adapted to her pattern of eating and she will never spontaneously develop an appetite. The only way she will develop an appetite is if she consistently forces herself to eat in mornings. Slowly, her body should readapt to regular meals and she will start to feel hunger again. Recommended she start w/ something light since she gets sick.   A close second in terms of importance, she needs to cut out, or drastically reduce, her sweetened beverage intake. She sounds to drink atleast several regular sodas each day (RD noted 3 separate 20 oz bottles  of regular mt dew in room- in varying states of fullness). RD listed a slew of alternate options she could choose from.   Third, we reviewed the plate method. She says she does not eat meat and frankly could go without it. RD noted important of protein in DM. Explained it is especially important now given her infection. We did review alternate sources of protein: greek yogurt, hummus, legumes, eggs.   Fourth, we discussed label reading. She does occassionally read labels now and is aware of the importance of checking the portion size. RD gave her an example of calculating carb intake based off label. For foods without labels and for when eating out, we discussed the utility that phone applications can have. Noted there are numerous electronic databases that list carb content of common foods. Gave her a goal of 60-80g carb per meal.   RD provided patient numerous different diabetes handouts and handwrote the above listed goals on them.   Expect Good compliance. From our discussion, it is obvious patient has retained knowledge from past DM diet educations. She largely knows what she needs to change and does seem to be motivated. It sounds like the biggest barrier to change will be her environment- as her fiance has poor nutritional tendencies and he is the one who does the food prep/shopping.   Body mass index is 47.68 kg/m. Pt meets criteria for Morbidly obese based on current BMI.  No further nutrition interventions warranted at this time. If additional nutrition issues arise,  please re-consult RD.  Burtis Junes RD, LDN, CNSC Clinical Nutrition Available Tues-Sat via Pager: [phone] 07/04/2018 11:16 AM

## 2018-07-05 LAB — BASIC METABOLIC PANEL
Anion gap: 7 (ref 5–15)
BUN: 27 mg/dL — ABNORMAL HIGH (ref 6–20)
CO2: 22 mmol/L (ref 22–32)
Calcium: 7.7 mg/dL — ABNORMAL LOW (ref 8.9–10.3)
Chloride: 107 mmol/L (ref 98–111)
Creatinine, Ser: 0.83 mg/dL (ref 0.44–1.00)
GFR calc Af Amer: >60 mL/min (ref >60)
GFR calc non Af Amer: >60 mL/min (ref >60)
Glucose, Bld: 254 mg/dL — ABNORMAL HIGH (ref 70–99)
POTASSIUM: 3.9 mmol/L (ref 3.5–5.1)
Sodium: 136 mmol/L (ref 135–145)

## 2018-07-05 LAB — CBC
HEMATOCRIT: 29.1 % — AB (ref 36.0–46.0)
HEMOGLOBIN: 9.3 g/dL — AB (ref 12.0–15.0)
MCH: 28.4 pg (ref 26.0–34.0)
MCHC: 32 g/dL (ref 30.0–36.0)
MCV: 89 fL (ref 80.0–100.0)
Platelets: 261 10*3/uL (ref 150–400)
RBC: 3.27 MIL/uL — ABNORMAL LOW (ref 3.87–5.11)
RDW: 13 % (ref 11.5–15.5)
WBC: 11.1 10*3/uL — ABNORMAL HIGH (ref 4.0–10.5)
nRBC: 0 % (ref 0.0–0.2)

## 2018-07-05 LAB — GLUCOSE, CAPILLARY
GLUCOSE-CAPILLARY: 240 mg/dL — AB (ref 70–99)
Glucose-Capillary: 242 mg/dL — ABNORMAL HIGH (ref 70–99)
Glucose-Capillary: 223 mg/dL — ABNORMAL HIGH (ref 70–99)
Glucose-Capillary: 226 mg/dL — ABNORMAL HIGH (ref 70–99)
Glucose-Capillary: 204 mg/dL — ABNORMAL HIGH (ref 70–99)

## 2018-07-05 MED ORDER — INSULIN ASPART 100 UNIT/ML ~~LOC~~ SOLN
8.0000 [IU] | Freq: Three times a day (TID) | SUBCUTANEOUS | Status: DC
  Administered 2018-07-05 – 2018-07-08 (×9): 8 [IU] via SUBCUTANEOUS

## 2018-07-05 NOTE — Progress Notes (Signed)
Inpatient Diabetes Program Recommendations  AACE/ADA: New Consensus Statement on Inpatient Glycemic Control  Target Ranges:  Prepandial:   less than 140 mg/dL      Peak postprandial:   less than 180 mg/dL (1-2 hours)      Critically ill patients:  140 - 180 mg/dL   Results for Sheila Austin, Sheila Austin (MRN [id number]) as of 07/05/2018 07:43  Ref. Range 07/04/2018 09:29 07/04/2018 11:21 07/04/2018 16:41 07/04/2018 20:48 07/04/2018 23:48 07/05/2018 04:23 07/05/2018 07:36  Glucose-Capillary Latest Ref Range: 70 - 99 mg/dL 192 (H) 276 (H) 263 (H) 346 (H) 246 (H) 242 (H) 223 (H)   Review of Glycemic Control  Diabetes history: DM2 Outpatient Diabetes medications: None Current orders for Inpatient glycemic control: Lantus 40 units daily, Novolog 0-15 units Q4H  Inpatient Diabetes Program Recommendations:   Insulin - Basal: Noted Lantus was increased to 40 units daily (to start today).   Insulin - Meal Coverage: Please consider ordering Novolog 8 units TID with meals for meal coverage if patient eats at least 50% of meals.  HgbA1C: A1C 13.6% on 07/03/18 indicating an average glcuose of 344 mg/dl over the past 2-3 months.  NOTE: Patient received a total of Novolog 40 units for correction on 07/04/18 (in addition to Lantus 30 units) and glucose ranged from 192-346 mg/dl. At time of discharge: Please provide Rx for: glucometer & testing supplies (#[id number]), Lantus Solostar pen [phone]), Humalog Kwikpen [phone]), and insulin pen needles [phone]).  Thanks, Barnie Alderman, RN, MSN, CDE Diabetes Coordinator Inpatient Diabetes Program [phone] (Team Pager from 8am to 5pm)

## 2018-07-05 NOTE — Progress Notes (Signed)
PROGRESS NOTE    Sheila Austin  ZGY:[id number] DOB: [birth year] DOA: 07/02/2018 PCP: Patient, No Pcp Per    Brief Narrative:  33 year old female with history of diabetes and hypertension, presented with right facial swelling.  She recently had 2 teeth extracted on the right side approximately 1 week prior to admission.  She was prescribed a course of clindamycin and reported compliance with this.  She was found to have significant cellulitis.  CT maxillofacial did not indicate underlying abscess.  She was admitted for IV antibiotics.   Assessment & Plan:   Active Problems:   Diabetes mellitus out of control [facility])   Essential hypertension, benign   BMI 45.0-49.9, adult (HCC)   Facial cellulitis   1. Right facial cellulitis.  Recently had teeth removed on the same side.  CT maxillofacial does not indicate any underlying abscess.  Overall swelling has improved since admission, but she continues to have significant pain and induration over her right face.  She is currently on clindamycin.  Can consider rescanning the patient over the next 24 hours to monitor for any progression/development of abscess. 2. Diabetes, uncontrolled, with hyperglycemia.  A1c of 13.6.  Blood sugars remain elevated.  Lantus dose has been increased and meal coverage NovoLog is been added.  Continue on sliding scale insulin 3. Hypertension.  Continue on Norvasc.  Blood pressure currently stable. 4. Morbid obesity.  Counseled on the importance of diet and exercise.   DVT prophylaxis: Lovenox Code Status: Full code Family Communication: No family present Disposition Plan: Discharge home once improved   Consultants:     Procedures:     Antimicrobials:   Vancomycin 2/26> 2/28  Flagyl 2/26> 2/28  Levofloxacin 2/26 > 2/28  Clindamycin 2/28 >   Subjective: Continues to have pain and swelling of right side of her face.  Describes as a stinging pain.  Objective: Vitals:   07/04/18 2051 07/05/18  0100 07/05/18 0425 07/05/18 1407  BP: (!) 142/105 (!) 146/96 (!) 143/82 (!) 159/111  Pulse: (!) 105  (!) 105 98  Resp: 18  19 20   Temp: 98.5 F (36.9 C)  98.6 F (37 C) 98 F (36.7 C)  TempSrc: Oral  Oral   SpO2: 100%  100% 100%  Weight:      Height:        Intake/Output Summary (Last 24 hours) at 07/05/2018 1626 Last data filed at 07/05/2018 0900 Gross per 24 hour  Intake 1010 ml  Output 300 ml  Net 710 ml   Filed Weights   07/02/18 1206 07/03/18 0041  Weight: 120.2 kg 118.3 kg    Examination:  General exam: Alert, awake, oriented x 3 HEENT: overall swelling on right side of face is better. Induration over right cheek with erythema persists Respiratory system: Clear to auscultation. Respiratory effort normal. Cardiovascular system:RRR. No murmurs, rubs, gallops. Gastrointestinal system: Abdomen is nondistended, soft and nontender. No organomegaly or masses felt. Normal bowel sounds heard. Central nervous system: Alert and oriented. No focal neurological deficits. Extremities: No C/C/E, +pedal pulses Skin: No rashes, lesions or ulcers Psychiatry: Judgement and insight appear normal. Mood & affect appropriate.    Data Reviewed: I have personally reviewed following labs and imaging studies  CBC: Recent Labs  Lab 07/02/18 1336 07/03/18 0617 07/05/18 0719  WBC 17.0* 14.7* 11.1*  NEUTROABS 12.6*  --   --   HGB 12.4 10.6* 9.3*  HCT 37.7 32.6* 29.1*  MCV 87.1 86.9 89.0  PLT 358 312 591   Basic Metabolic  Panel: Recent Labs  Lab 07/02/18 1336 07/03/18 0617 07/05/18 0719  NA 131* 132* 136  K 4.3 4.1 3.9  CL 94* 100 107  CO2 27 21* 22  GLUCOSE 331* 336* 254*  BUN 12 17 27*  CREATININE 0.74 0.71 0.83  CALCIUM 8.9 8.2* 7.7*   GFR: Estimated Creatinine Clearance: 121.1 mL/min (by C-G formula based on SCr of 0.83 mg/dL). Liver Function Tests: No results for input(s): AST, ALT, ALKPHOS, BILITOT, PROT, ALBUMIN in the last 168 hours. No results for input(s):  LIPASE, AMYLASE in the last 168 hours. No results for input(s): AMMONIA in the last 168 hours. Coagulation Profile: No results for input(s): INR, PROTIME in the last 168 hours. Cardiac Enzymes: No results for input(s): CKTOTAL, CKMB, CKMBINDEX, TROPONINI in the last 168 hours. BNP (last 3 results) No results for input(s): PROBNP in the last 8760 hours. HbA1C: Recent Labs    07/03/18 0617  HGBA1C 13.6*   CBG: Recent Labs  Lab 07/04/18 2348 07/05/18 0423 07/05/18 0736 07/05/18 1108 07/05/18 1605  GLUCAP 246* 242* 223* 240* 226*   Lipid Profile: No results for input(s): CHOL, HDL, LDLCALC, TRIG, CHOLHDL, LDLDIRECT in the last 72 hours. Thyroid Function Tests: No results for input(s): TSH, T4TOTAL, FREET4, T3FREE, THYROIDAB in the last 72 hours. Anemia Panel: No results for input(s): VITAMINB12, FOLATE, FERRITIN, TIBC, IRON, RETICCTPCT in the last 72 hours. Sepsis Labs: Recent Labs  Lab 07/02/18 2050  LATICACIDVEN 1.2    Recent Results (from the past 240 hour(s))  Culture, blood (routine x 2)     Status: None (Preliminary result)   Collection Time: 07/02/18  6:41 PM  Result Value Ref Range Status   Specimen Description RIGHT ANTECUBITAL  Final   Special Requests   Final    BOTTLES DRAWN AEROBIC AND ANAEROBIC Blood Culture adequate volume   Culture   Final    NO GROWTH 3 DAYS Performed at [facility], [address], [place], [place] [address]    Report Status PENDING  Incomplete  Culture, blood (routine x 2)     Status: None (Preliminary result)   Collection Time: 07/02/18  6:42 PM  Result Value Ref Range Status   Specimen Description BLOOD RIGHT HAND  Final   Special Requests   Final    BOTTLES DRAWN AEROBIC AND ANAEROBIC Blood Culture adequate volume   Culture   Final    NO GROWTH 3 DAYS Performed at [facility], [address], [place], [place] [address]    Report Status PENDING  Incomplete         Radiology Studies: No results  found.      Scheduled Meds: . amLODipine  5 mg Oral Daily  . enoxaparin (LOVENOX) injection  0.5 mg/kg Subcutaneous Q24H  . insulin aspart  0-15 Units Subcutaneous Q4H  . insulin aspart  8 Units Subcutaneous TID WC  . insulin glargine  40 Units Subcutaneous Daily  . ENSURE MAX PROTEIN  11 oz Oral Daily   Continuous Infusions: . clindamycin (CLEOCIN) IV 600 mg (07/05/18 1342)     LOS: 3 days    Time spent: 3mins  Kathie Dike, MD [facility]   If 7PM-7AM, please contact night-coverage [email or web address]  07/05/2018, 4:26 PM

## 2018-07-06 ENCOUNTER — Inpatient Hospital Stay (HOSPITAL_COMMUNITY): Payer: 59

## 2018-07-06 LAB — GLUCOSE, CAPILLARY
Glucose-Capillary: 111 mg/dL — ABNORMAL HIGH (ref 70–99)
Glucose-Capillary: 128 mg/dL — ABNORMAL HIGH (ref 70–99)
Glucose-Capillary: 164 mg/dL — ABNORMAL HIGH (ref 70–99)
Glucose-Capillary: 181 mg/dL — ABNORMAL HIGH (ref 70–99)
Glucose-Capillary: 182 mg/dL — ABNORMAL HIGH (ref 70–99)
Glucose-Capillary: 228 mg/dL — ABNORMAL HIGH (ref 70–99)

## 2018-07-06 LAB — CBC
HCT: 32 % — ABNORMAL LOW (ref 36.0–46.0)
Hemoglobin: 10.3 g/dL — ABNORMAL LOW (ref 12.0–15.0)
MCH: 29.1 pg (ref 26.0–34.0)
MCHC: 32.2 g/dL (ref 30.0–36.0)
MCV: 90.4 fL (ref 80.0–100.0)
NRBC: 0 % (ref 0.0–0.2)
PLATELETS: 308 10*3/uL (ref 150–400)
RBC: 3.54 MIL/uL — ABNORMAL LOW (ref 3.87–5.11)
RDW: 12.6 % (ref 11.5–15.5)
WBC: 12.4 10*3/uL — ABNORMAL HIGH (ref 4.0–10.5)

## 2018-07-06 LAB — BASIC METABOLIC PANEL
Anion gap: 7 (ref 5–15)
BUN: 13 mg/dL (ref 6–20)
CO2: 24 mmol/L (ref 22–32)
Calcium: 8 mg/dL — ABNORMAL LOW (ref 8.9–10.3)
Chloride: 104 mmol/L (ref 98–111)
Creatinine, Ser: 0.62 mg/dL (ref 0.44–1.00)
GFR calc Af Amer: >60 mL/min (ref >60)
GFR calc non Af Amer: >60 mL/min (ref >60)
GLUCOSE: 222 mg/dL — AB (ref 70–99)
Potassium: 4.2 mmol/L (ref 3.5–5.1)
Sodium: 135 mmol/L (ref 135–145)

## 2018-07-06 MED ORDER — OXYCODONE HCL 5 MG PO TABS
5.0000 mg | ORAL_TABLET | Freq: Four times a day (QID) | ORAL | Status: DC | PRN
  Administered 2018-07-06: 10 mg via ORAL
  Administered 2018-07-06 – 2018-07-07 (×3): 5 mg via ORAL
  Administered 2018-07-08 (×2): 10 mg via ORAL
  Filled 2018-07-06: qty 1
  Filled 2018-07-06: qty 2
  Filled 2018-07-06 (×3): qty 1
  Filled 2018-07-06: qty 2
  Filled 2018-07-06: qty 1

## 2018-07-06 MED ORDER — ACETAMINOPHEN 650 MG RE SUPP
650.0000 mg | RECTAL | Status: DC
  Filled 2018-07-06: qty 1

## 2018-07-06 MED ORDER — SODIUM CHLORIDE 0.9 % IV SOLN
INTRAVENOUS | Status: DC
  Administered 2018-07-06 – 2018-07-08 (×3): via INTRAVENOUS

## 2018-07-06 MED ORDER — INSULIN ASPART 100 UNIT/ML ~~LOC~~ SOLN
0.0000 [IU] | Freq: Three times a day (TID) | SUBCUTANEOUS | Status: DC
  Administered 2018-07-06: 3 [IU] via SUBCUTANEOUS
  Administered 2018-07-06: 2 [IU] via SUBCUTANEOUS
  Administered 2018-07-07: 5 [IU] via SUBCUTANEOUS
  Administered 2018-07-07 – 2018-07-08 (×4): 3 [IU] via SUBCUTANEOUS

## 2018-07-06 MED ORDER — SACCHAROMYCES BOULARDII 250 MG PO CAPS
250.0000 mg | ORAL_CAPSULE | Freq: Two times a day (BID) | ORAL | Status: DC
  Administered 2018-07-06 – 2018-07-08 (×5): 250 mg via ORAL
  Filled 2018-07-06 (×5): qty 1

## 2018-07-06 MED ORDER — ACETAMINOPHEN 325 MG PO TABS
650.0000 mg | ORAL_TABLET | ORAL | Status: DC
  Administered 2018-07-06 – 2018-07-08 (×11): 650 mg via ORAL
  Filled 2018-07-06 (×12): qty 2

## 2018-07-06 MED ORDER — IOHEXOL 300 MG/ML  SOLN
75.0000 mL | Freq: Once | INTRAMUSCULAR | Status: AC | PRN
  Administered 2018-07-06: 75 mL via INTRAVENOUS

## 2018-07-06 NOTE — Progress Notes (Signed)
PROGRESS NOTE    Sheila Austin  POE:[id number] DOB: [birth year] DOA: 07/02/2018 PCP: Patient, No Pcp Per    Brief Narrative:  33 year old female with history of diabetes and hypertension, presented with right facial swelling.  She recently had 2 teeth extracted on the right side approximately 1 week prior to admission.  She was prescribed a course of clindamycin and reported compliance with this.  She was found to have significant cellulitis.  CT maxillofacial did not indicate underlying abscess.  She was admitted for IV antibiotics.   Assessment & Plan:   Active Problems:   Diabetes mellitus out of control [facility])   Essential hypertension, benign   BMI 45.0-49.9, adult (HCC)   Facial cellulitis   1. Right facial cellulitis.  Recently had teeth removed on the same side.  CT maxillofacial does not indicate any underlying abscess.  Repeat CT shows slight improvement but still no abscess seen.  Pt continues to complain of pain and discomfort.  She is currently on clindamycin. Will discuss case with ENT on call tomorrow.   2. Diabetes, uncontrolled, with hyperglycemia.  A1c of 13.6.  Blood sugars improving.  Continue on sliding scale insulin 3. Hypertension.  Continue on Norvasc.  Blood pressure currently stable. 4. Sinus tachycardia - blood cultures no growth, add IV fluids.  5. Morbid obesity.  Counseled on the importance of diet and exercise.  DVT prophylaxis: Lovenox Code Status: Full code Family Communication: No family present Disposition Plan: Discharge home once improved   Consultants:     Procedures:     Antimicrobials:   Vancomycin 2/26> 2/28  Flagyl 2/26> 2/28  Levofloxacin 2/26 > 2/28  Clindamycin 2/28 >>   Subjective: Pt reporting that pain and swelling of right side of her face persists and only minimally improved  Objective: Vitals:   07/05/18 1407 07/05/18 2133 07/06/18 0523 07/06/18 1300  BP: (!) 159/111 109/86 (!) 177/106 (!) 155/97  Pulse: 98  (!) 103 (!) 106 (!) 105  Resp: 20 20 20 20   Temp: 98 F (36.7 C) 98.3 F (36.8 C) 98 F (36.7 C) 98.8 F (37.1 C)  TempSrc:  Oral Oral Oral  SpO2: 100% 100% 100% 99%  Weight:      Height:        Intake/Output Summary (Last 24 hours) at 07/06/2018 1311 Last data filed at 07/06/2018 1100 Gross per 24 hour  Intake 600 ml  Output 2000 ml  Net -1400 ml   Filed Weights   07/02/18 1206 07/03/18 0041  Weight: 120.2 kg 118.3 kg    Examination:  General exam: Alert, awake, oriented x 3 HEENT: swollen induration on right side of face causing right eye to be partially closed. Induration over right cheek with erythema persists.    Respiratory system: Clear to auscultation. Respiratory effort normal. Cardiovascular system:normal s1,s2 sounds. No murmurs, rubs, gallops. Gastrointestinal system: Abdomen is nondistended, soft and nontender. No organomegaly or masses felt. Normal bowel sounds heard. Central nervous system: Alert and oriented. No focal neurological deficits. Extremities: No C/C/E, +pedal pulses Skin: No rashes, lesions or ulcers Psychiatry: Judgement and insight appear normal. Mood & affect appropriate.   Data Reviewed: I have personally reviewed following labs and imaging studies  CBC: Recent Labs  Lab 07/02/18 1336 07/03/18 0617 07/05/18 0719  WBC 17.0* 14.7* 11.1*  NEUTROABS 12.6*  --   --   HGB 12.4 10.6* 9.3*  HCT 37.7 32.6* 29.1*  MCV 87.1 86.9 89.0  PLT 358 312 315   Basic Metabolic  Panel: Recent Labs  Lab 07/02/18 1336 07/03/18 0617 07/05/18 0719 07/06/18 1058  NA 131* 132* 136 135  K 4.3 4.1 3.9 4.2  CL 94* 100 107 104  CO2 27 21* 22 24  GLUCOSE 331* 336* 254* 222*  BUN 12 17 27* 13  CREATININE 0.74 0.71 0.83 0.62  CALCIUM 8.9 8.2* 7.7* 8.0*   GFR: Estimated Creatinine Clearance: 125.6 mL/min (by C-G formula based on SCr of 0.62 mg/dL). Liver Function Tests: No results for input(s): AST, ALT, ALKPHOS, BILITOT, PROT, ALBUMIN in the last 168  hours. No results for input(s): LIPASE, AMYLASE in the last 168 hours. No results for input(s): AMMONIA in the last 168 hours. Coagulation Profile: No results for input(s): INR, PROTIME in the last 168 hours. Cardiac Enzymes: No results for input(s): CKTOTAL, CKMB, CKMBINDEX, TROPONINI in the last 168 hours. BNP (last 3 results) No results for input(s): PROBNP in the last 8760 hours. HbA1C: No results for input(s): HGBA1C in the last 72 hours. CBG: Recent Labs  Lab 07/05/18 2004 07/06/18 0014 07/06/18 0415 07/06/18 0728 07/06/18 1144  GLUCAP 204* 228* 182* 164* 181*   Lipid Profile: No results for input(s): CHOL, HDL, LDLCALC, TRIG, CHOLHDL, LDLDIRECT in the last 72 hours. Thyroid Function Tests: No results for input(s): TSH, T4TOTAL, FREET4, T3FREE, THYROIDAB in the last 72 hours. Anemia Panel: No results for input(s): VITAMINB12, FOLATE, FERRITIN, TIBC, IRON, RETICCTPCT in the last 72 hours. Sepsis Labs: Recent Labs  Lab 07/02/18 2050  LATICACIDVEN 1.2    Recent Results (from the past 240 hour(s))  Culture, blood (routine x 2)     Status: None (Preliminary result)   Collection Time: 07/02/18  6:41 PM  Result Value Ref Range Status   Specimen Description RIGHT ANTECUBITAL  Final   Special Requests   Final    BOTTLES DRAWN AEROBIC AND ANAEROBIC Blood Culture adequate volume   Culture   Final    NO GROWTH 4 DAYS Performed at [facility], [address] [place], [place] [address]    Report Status PENDING  Incomplete  Culture, blood (routine x 2)     Status: None (Preliminary result)   Collection Time: 07/02/18  6:42 PM  Result Value Ref Range Status   Specimen Description BLOOD RIGHT HAND  Final   Special Requests   Final    BOTTLES DRAWN AEROBIC AND ANAEROBIC Blood Culture adequate volume   Culture   Final    NO GROWTH 4 DAYS Performed at [facility], [address], [place],  [address]    Report Status PENDING  Incomplete   Radiology Studies: Ct  Maxillofacial W Contrast  Result Date: 07/06/2018 CLINICAL DATA:  Right facial cellulitis with persistent swelling on IV antibiotics. EXAM: CT MAXILLOFACIAL WITH CONTRAST TECHNIQUE: Multidetector CT imaging of the maxillofacial structures was performed with intravenous contrast. Multiplanar CT image reconstructions were also generated. CONTRAST:  52mL OMNIPAQUE IOHEXOL 300 MG/ML  SOLN COMPARISON:  07/02/2018 FINDINGS: Osseous: No acute fracture or interval osseous destruction is identified. Multiple dental caries are again noted with unchanged periapical lucencies involving left mandibular molar and left maxillary molar and premolar teeth. Recent extraction of both right maxillary premolar teeth is again noted. Orbits: Stable to slightly improved right preseptal soft tissue swelling. No postseptal inflammation. Sinuses: Unchanged mild right maxillary sinus mucosal thickening. No fluid in the paranasal sinuses or mastoid air cells. Soft tissues: Inflammatory changes are again seen in the soft tissues lateral to the right maxilla over the tooth extraction sites with  extension into the subcutaneous tissues throughout the face including extending inferior to the mandible with thickening of the platysma. Overall, the degree of soft tissue swelling has mildly improved. No organized fluid collection is identified. Small bilateral lymph nodes in the included cervical stations and parotid glands are similar to the prior study and likely reactive with the largest node measuring 11 mm in short axis in right level IB. Limited intracranial: Unremarkable. IMPRESSION: Mildly improved right-sided facial cellulitis. No organized fluid collection. Electronically Signed   By: Logan Bores M.D.   On: 07/06/2018 11:53   Scheduled Meds: . amLODipine  5 mg Oral Daily  . enoxaparin (LOVENOX) injection  0.5 mg/kg Subcutaneous Q24H  . insulin aspart  0-15 Units Subcutaneous TID WC  . insulin aspart  8 Units Subcutaneous TID WC  .  insulin glargine  40 Units Subcutaneous Daily  . ENSURE MAX PROTEIN  11 oz Oral Daily   Continuous Infusions: . clindamycin (CLEOCIN) IV 600 mg (07/06/18 0433)     LOS: 4 days   Time spent: 30 mins  Chyna Kneece Wynetta Emery, MD [facility] How to contact the [facility] Attending or Consulting provider [place] or covering provider during after hours [place], for this patient?  1. Check the care team in [facility] and look for a) attending/consulting TRH provider listed and b) the [facility] team listed 2. Log into [email or web address] and use 's universal password to access. If you do not have the password, please contact the hospital operator. 3. Locate the [facility] provider you are looking for under [facility] and page to a number that you can be directly reached. 4. If you still have difficulty reaching the provider, please page the [facility] (Director on Call) for the Hospitalists listed on amion for assistance.   If 7PM-7AM, please contact night-coverage [email or web address]  07/06/2018, 1:11 PM

## 2018-07-07 LAB — GLUCOSE, CAPILLARY
GLUCOSE-CAPILLARY: 187 mg/dL — AB (ref 70–99)
Glucose-Capillary: 238 mg/dL — ABNORMAL HIGH (ref 70–99)
Glucose-Capillary: 156 mg/dL — ABNORMAL HIGH (ref 70–99)
Glucose-Capillary: 218 mg/dL — ABNORMAL HIGH (ref 70–99)

## 2018-07-07 LAB — COMPREHENSIVE METABOLIC PANEL
ALT: 17 U/L (ref 0–44)
AST: 20 U/L (ref 15–41)
Albumin: 2.4 g/dL — ABNORMAL LOW (ref 3.5–5.0)
Alkaline Phosphatase: 78 U/L (ref 38–126)
Anion gap: 7 (ref 5–15)
BILIRUBIN TOTAL: 0.3 mg/dL (ref 0.3–1.2)
BUN: 13 mg/dL (ref 6–20)
CO2: 25 mmol/L (ref 22–32)
CREATININE: 0.74 mg/dL (ref 0.44–1.00)
Calcium: 8.1 mg/dL — ABNORMAL LOW (ref 8.9–10.3)
Chloride: 103 mmol/L (ref 98–111)
GFR calc Af Amer: >60 mL/min (ref >60)
Glucose, Bld: 212 mg/dL — ABNORMAL HIGH (ref 70–99)
Potassium: 4.4 mmol/L (ref 3.5–5.1)
Sodium: 135 mmol/L (ref 135–145)
Total Protein: 6.2 g/dL — ABNORMAL LOW (ref 6.5–8.1)

## 2018-07-07 LAB — CULTURE, BLOOD (ROUTINE X 2)
Culture: NO GROWTH
Culture: NO GROWTH
Special Requests: ADEQUATE
Special Requests: ADEQUATE

## 2018-07-07 LAB — CBC WITH DIFFERENTIAL/PLATELET
Abs Immature Granulocytes: 0.09 10*3/uL — ABNORMAL HIGH (ref 0.00–0.07)
Basophils Absolute: 0.1 10*3/uL (ref 0.0–0.1)
Basophils Relative: 1 %
EOS ABS: 0.3 10*3/uL (ref 0.0–0.5)
Eosinophils Relative: 3 %
HEMATOCRIT: 31.3 % — AB (ref 36.0–46.0)
Hemoglobin: 10.1 g/dL — ABNORMAL LOW (ref 12.0–15.0)
Immature Granulocytes: 1 %
Lymphocytes Relative: 30 %
Lymphs Abs: 3.3 10*3/uL (ref 0.7–4.0)
MCH: 29.2 pg (ref 26.0–34.0)
MCHC: 32.3 g/dL (ref 30.0–36.0)
MCV: 90.5 fL (ref 80.0–100.0)
Monocytes Absolute: 0.9 10*3/uL (ref 0.1–1.0)
Monocytes Relative: 8 %
Neutro Abs: 6.4 10*3/uL (ref 1.7–7.7)
Neutrophils Relative %: 57 %
Platelets: 298 10*3/uL (ref 150–400)
RBC: 3.46 MIL/uL — ABNORMAL LOW (ref 3.87–5.11)
RDW: 12.7 % (ref 11.5–15.5)
WBC: 11.1 10*3/uL — ABNORMAL HIGH (ref 4.0–10.5)
nRBC: 0 % (ref 0.0–0.2)

## 2018-07-07 LAB — MAGNESIUM: Magnesium: 1.9 mg/dL (ref 1.7–2.4)

## 2018-07-07 NOTE — Progress Notes (Signed)
PROGRESS NOTE   Sheila Austin  RXV:[id number] DOB: 1985/05/31 DOA: 07/02/2018 PCP: Patient, No Pcp Per    Brief Narrative:  33 year old female with history of diabetes and hypertension, presented with right facial swelling.  She recently had 2 teeth extracted on the right side approximately 1 week prior to admission.  She was prescribed a course of clindamycin and reported compliance with this.  She was found to have significant cellulitis.  CT maxillofacial did not indicate underlying abscess.  She was admitted for IV antibiotics.  Assessment & Plan:   Active Problems:   Diabetes mellitus out of control [facility])   Essential hypertension, benign   BMI 45.0-49.9, adult (HCC)   Facial cellulitis   1. Right facial cellulitis.  Slowly improving.  Recently had teeth removed on the same side.  CT maxillofacial does not indicate any underlying abscess.  Repeat CT shows slight improvement but still no abscess seen.  Pt continues to complain of pain and discomfort.  She is currently on clindamycin. Will discuss case with ENT and make sure patient has ENT follow up information.  Added probiotics. 2. Diabetes, uncontrolled, with hyperglycemia.  A1c of 13.6.  Blood sugars improving.  Continue on sliding scale insulin.  Patient will be discharged with prescriptions for glucose meter supplies and insulin pen and supplies.  Outpatient endocrinology follow-up recommended. 3. Hypertension.  Continue on Norvasc.  Blood pressure currently stable. 4. Sinus tachycardia - RESOLVED.  blood cultures no growth, added IV fluids.  5. Morbid obesity.  Counseled on the importance of diet and exercise.  DVT prophylaxis: Lovenox Code Status: Full code Family Communication: No family present Disposition Plan: Discharge home when no longer requiring IV antibiotics  Consultants:     Procedures:     Antimicrobials:   Vancomycin 2/26> 2/28  Flagyl 2/26> 2/28  Levofloxacin 2/26 > 2/28  Clindamycin 2/28  >>  Subjective: Pt reporting that pain and swelling of right side of her face persists and only minimally improved  Objective: Vitals:   07/06/18 1300 07/06/18 2204 07/07/18 0554 07/07/18 1314  BP: (!) 155/97 (!) 172/104 (!) 146/102 135/82  Pulse: (!) 105 (!) 107 90 93  Resp: 20   18  Temp: 98.8 F (37.1 C) 98.6 F (37 C) 97.9 F (36.6 C) 98.3 F (36.8 C)  TempSrc: Oral Oral Oral Oral  SpO2: 99% 100% 99% 100%  Weight:      Height:        Intake/Output Summary (Last 24 hours) at 07/07/2018 1542 Last data filed at 07/06/2018 1700 Gross per 24 hour  Intake 383.96 ml  Output 300 ml  Net 83.96 ml   Filed Weights   07/02/18 1206 07/03/18 0041  Weight: 120.2 kg 118.3 kg   Examination:  General exam: Alert, awake, oriented x 3 HEENT: slightly improved swollen induration on right side of face causing right eye to be partially closed. Induration over right cheek with improving erythema.    Respiratory system: Clear to auscultation. Respiratory effort normal. Cardiovascular system: normal s1,s2 sounds. No murmurs, rubs, gallops. Gastrointestinal system: Abdomen is nondistended, soft and nontender. No organomegaly or masses felt. Normal bowel sounds heard. Central nervous system: Alert and oriented. No focal neurological deficits. Extremities: No C/C/E, +pedal pulses Skin: No rashes, lesions or ulcers Psychiatry: Judgement and insight appear normal. Mood & affect appropriate.   Data Reviewed: I have personally reviewed following labs and imaging studies  CBC: Recent Labs  Lab 07/02/18 1336 07/03/18 0617 07/05/18 0719 07/06/18 1058 07/07/18 5093  WBC 17.0* 14.7* 11.1* 12.4* 11.1*  NEUTROABS 12.6*  --   --   --  6.4  HGB 12.4 10.6* 9.3* 10.3* 10.1*  HCT 37.7 32.6* 29.1* 32.0* 31.3*  MCV 87.1 86.9 89.0 90.4 90.5  PLT 358 312 261 308 956   Basic Metabolic Panel: Recent Labs  Lab 07/02/18 1336 07/03/18 0617 07/05/18 0719 07/06/18 1058 07/07/18 0620  NA 131* 132* 136  135 135  K 4.3 4.1 3.9 4.2 4.4  CL 94* 100 107 104 103  CO2 27 21* 22 24 25   GLUCOSE 331* 336* 254* 222* 212*  BUN 12 17 27* 13 13  CREATININE 0.74 0.71 0.83 0.62 0.74  CALCIUM 8.9 8.2* 7.7* 8.0* 8.1*  MG  --   --   --   --  1.9   GFR: Estimated Creatinine Clearance: 125.6 mL/min (by C-G formula based on SCr of 0.74 mg/dL). Liver Function Tests: Recent Labs  Lab 07/07/18 0620  AST 20  ALT 17  ALKPHOS 78  BILITOT 0.3  PROT 6.2*  ALBUMIN 2.4*   No results for input(s): LIPASE, AMYLASE in the last 168 hours. No results for input(s): AMMONIA in the last 168 hours. Coagulation Profile: No results for input(s): INR, PROTIME in the last 168 hours. Cardiac Enzymes: No results for input(s): CKTOTAL, CKMB, CKMBINDEX, TROPONINI in the last 168 hours. BNP (last 3 results) No results for input(s): PROBNP in the last 8760 hours. HbA1C: No results for input(s): HGBA1C in the last 72 hours. CBG: Recent Labs  Lab 07/06/18 1144 07/06/18 1623 07/06/18 2112 07/07/18 0739 07/07/18 1145  GLUCAP 181* 128* 111* 187* 238*   Lipid Profile: No results for input(s): CHOL, HDL, LDLCALC, TRIG, CHOLHDL, LDLDIRECT in the last 72 hours. Thyroid Function Tests: No results for input(s): TSH, T4TOTAL, FREET4, T3FREE, THYROIDAB in the last 72 hours. Anemia Panel: No results for input(s): VITAMINB12, FOLATE, FERRITIN, TIBC, IRON, RETICCTPCT in the last 72 hours. Sepsis Labs: Recent Labs  Lab 07/02/18 2050  LATICACIDVEN 1.2    Recent Results (from the past 240 hour(s))  Culture, blood (routine x 2)     Status: None   Collection Time: 07/02/18  6:41 PM  Result Value Ref Range Status   Specimen Description RIGHT ANTECUBITAL  Final   Special Requests   Final    BOTTLES DRAWN AEROBIC AND ANAEROBIC Blood Culture adequate volume   Culture   Final    NO GROWTH 5 DAYS Performed at [facility], [address] [place], [place] [address]    Report Status 07/07/2018 FINAL  Final  Culture, blood  (routine x 2)     Status: None   Collection Time: 07/02/18  6:42 PM  Result Value Ref Range Status   Specimen Description BLOOD RIGHT HAND  Final   Special Requests   Final    BOTTLES DRAWN AEROBIC AND ANAEROBIC Blood Culture adequate volume   Culture   Final    NO GROWTH 5 DAYS Performed at [facility], [address] [place], [place] [address]    Report Status 07/07/2018 FINAL  Final   Radiology Studies: Ct Maxillofacial W Contrast  Result Date: 07/06/2018 CLINICAL DATA:  Right facial cellulitis with persistent swelling on IV antibiotics. EXAM: CT MAXILLOFACIAL WITH CONTRAST TECHNIQUE: Multidetector CT imaging of the maxillofacial structures was performed with intravenous contrast. Multiplanar CT image reconstructions were also generated. CONTRAST:  91mL OMNIPAQUE IOHEXOL 300 MG/ML  SOLN COMPARISON:  07/02/2018 FINDINGS: Osseous: No acute fracture or interval osseous destruction is identified.  Multiple dental caries are again noted with unchanged periapical lucencies involving left mandibular molar and left maxillary molar and premolar teeth. Recent extraction of both right maxillary premolar teeth is again noted. Orbits: Stable to slightly improved right preseptal soft tissue swelling. No postseptal inflammation. Sinuses: Unchanged mild right maxillary sinus mucosal thickening. No fluid in the paranasal sinuses or mastoid air cells. Soft tissues: Inflammatory changes are again seen in the soft tissues lateral to the right maxilla over the tooth extraction sites with extension into the subcutaneous tissues throughout the face including extending inferior to the mandible with thickening of the platysma. Overall, the degree of soft tissue swelling has mildly improved. No organized fluid collection is identified. Small bilateral lymph nodes in the included cervical stations and parotid glands are similar to the prior study and likely reactive with the largest node measuring 11 mm in short axis in right  level IB. Limited intracranial: Unremarkable. IMPRESSION: Mildly improved right-sided facial cellulitis. No organized fluid collection. Electronically Signed   By: Logan Bores M.D.   On: 07/06/2018 11:53   Scheduled Meds: . acetaminophen  650 mg Oral Q4H   Or  . acetaminophen  650 mg Rectal Q4H  . amLODipine  5 mg Oral Daily  . enoxaparin (LOVENOX) injection  0.5 mg/kg Subcutaneous Q24H  . insulin aspart  0-15 Units Subcutaneous TID WC  . insulin aspart  8 Units Subcutaneous TID WC  . insulin glargine  40 Units Subcutaneous Daily  . ENSURE MAX PROTEIN  11 oz Oral Daily  . saccharomyces boulardii  250 mg Oral BID   Continuous Infusions: . sodium chloride 70 mL/hr at 07/07/18 0825  . clindamycin (CLEOCIN) IV 600 mg (07/07/18 1326)    LOS: 5 days   Time spent: 30 mins  [place] Wynetta Emery, MD [facility] How to contact the [facility] Attending or Consulting provider [place] or covering provider during after hours [place], for this patient?  1. Check the care team in [facility] and look for a) attending/consulting TRH provider listed and b) the [facility] team listed 2. Log into [email or web address] and use [place]'s universal password to access. If you do not have the password, please contact the hospital operator. 3. Locate the [facility] provider you are looking for under [facility] and page to a number that you can be directly reached. 4. If you still have difficulty reaching the provider, please page the [facility] (Director on Call) for the Hospitalists listed on amion for assistance.  If 7PM-7AM, please contact night-coverage [email or web address]  07/07/2018, 3:42 PM

## 2018-07-08 LAB — GLUCOSE, CAPILLARY
Glucose-Capillary: 180 mg/dL — ABNORMAL HIGH (ref 70–99)
Glucose-Capillary: 166 mg/dL — ABNORMAL HIGH (ref 70–99)

## 2018-07-08 MED ORDER — SACCHAROMYCES BOULARDII 250 MG PO CAPS: 250.0000 mg | ORAL_CAPSULE | Freq: Two times a day (BID) | ORAL | 0 refills | Status: AC

## 2018-07-08 MED ORDER — BLOOD GLUCOSE METER KIT: PACK | 0 refills | Status: DC

## 2018-07-08 MED ORDER — AMLODIPINE BESYLATE 5 MG PO TABS: 5.0000 mg | ORAL_TABLET | Freq: Every day | ORAL | 0 refills | Status: DC

## 2018-07-08 MED ORDER — INSULIN GLARGINE 100 UNIT/ML SOLOSTAR PEN: 40.0000 [IU] | PEN_INJECTOR | Freq: Every day | SUBCUTANEOUS | 0 refills | Status: DC

## 2018-07-08 MED ORDER — INSULIN PEN NEEDLE 31G X 6 MM MISC: 1.0000 "pen " | 0 refills | Status: DC

## 2018-07-08 MED ORDER — INSULIN LISPRO (1 UNIT DIAL) 100 UNIT/ML (KWIKPEN): 8.0000 [IU] | PEN_INJECTOR | Freq: Three times a day (TID) | SUBCUTANEOUS | 0 refills | Status: DC

## 2018-07-08 MED ORDER — CLINDAMYCIN HCL 300 MG PO CAPS: 300.0000 mg | ORAL_CAPSULE | Freq: Three times a day (TID) | ORAL | 0 refills | Status: AC

## 2018-07-08 MED FILL — CLINDAMYCIN HCL 300 MG CAPS: 300 | 7 days supply | Qty: 21 | Fill #0

## 2018-07-08 MED FILL — UNIFINE PENTIPS 6MM 31G: 31G X 6 MM | 30 days supply | Qty: 100 | Fill #0

## 2018-07-08 MED FILL — HUMALOG 100 UNITS/ML KWIKPE: 100 | 63 days supply | Qty: 15 | Fill #0

## 2018-07-08 MED FILL — AMLODIPINE BESYLATE 5 MG TA: 5 | 30 days supply | Qty: 30 | Fill #0

## 2018-07-08 NOTE — Progress Notes (Signed)
Pt given permission to walk around the hospital with fiance, as long as she doesn't leave the the building per MD.

## 2018-07-08 NOTE — Progress Notes (Signed)
IV removed, WNL. D/C instructions given to pt. Verbalized understanding. Pt fiance at bedside to transport home.

## 2018-07-08 NOTE — Discharge Summary (Signed)
Physician Discharge [place] W[place]O:[id number] DOB: 03/13/1986 DOA: 07/02/2018   Admit date: 07/02/2018 Discharge date: 07/08/2018  Admitted From: Home  Disposition: Home   Recommendations for Outpatient Follow-up:  1. Follow up with ENT in 3-5 days 2. Please follow up with PCP in 1 week   Discharge Condition: STABLE   CODE STATUS: FULL    Brief Hospitalization Summary: Please see all hospital notes, images, labs for full details of the hospitalization. HPI: Sheila Austin is a 33 y.o. female with medical history significant for morbid obesity, DM, HTN, OSA, E. coli sepsis, who presented to the ED with complaints of swelling of the right side of her face.  Patient had extraction of 2 of her teeth on the right side about a week ago, she was prescribed clindamycin which she reports compliance with.  Despite the swelling on the right side of her face continued increase now involving her eyes such that her right eye is swollen shut.  She reports eye pain, and blurry vision. Reports fever to 100.2 at home, with chills.  No vomiting no loose stools.  No difficulty breathing, no difficulty swallowing, but she cant chew. She tolerated the antibiotics well.  She cannot remember the details of her allergy to penicillin.  ED Course: Tachycardia to 114.  Temperature 98.4.  WBC 17.  Sodium 131.  Maxillofacial CT with contrast-soft tissue swelling right face and orbit compatible with cellulitis, no soft tissue mass.  Patient started on IV clindamycin, 1L bolus given, hospitalist to admit for facial cellulitis.  Brief Narrative:  33 year old female with history of diabetes and hypertension, presented with right facial swelling.  She recently had 2 teeth extracted on the right side approximately 1 week prior to admission.  She was prescribed a course of clindamycin and reported compliance with this.  She was found to have significant cellulitis.  CT maxillofacial did not indicate underlying  abscess.  She was admitted for IV antibiotics.  Assessment & Plan:   Active Problems:   Diabetes mellitus out of control [facility])   Essential hypertension, benign   BMI 45.0-49.9, adult (HCC)   Facial cellulitis  1. Right facial cellulitis.  Continues to improve.  Recently had teeth removed on the same side.  CT maxillofacial does not indicate any underlying abscess.  Repeat CT shows slight improvement but still no abscess seen.  Pt continues to complain of pain and discomfort.  She is currently on clindamycin.   Added probiotics.  Pt continues to improve and wants to go home. Will have her follow up with ENT DR. Teoh in 3-5 days for recheck.  Discharge on oral clindamycin.  Pt was strongly advised to return if symptoms worsen or don't improve.  Pt verbalized understanding.   2. Type 2 diabetes, uncontrolled, with hyperglycemia.  A1c of 13.6.  Blood sugars improving.  Continue on sliding scale insulin.  Patient will be discharged with prescriptions for glucose meter supplies and insulin pen and supplies.  Outpatient endocrinology follow-up recommended. 3. Hypertension.  Continue on Norvasc.  Blood pressure currently stable. 4. Sinus tachycardia - RESOLVED.  blood cultures no growth.   5. Morbid obesity.  Counseled on the importance of diet and exercise.  DVT prophylaxis: Lovenox Code Status: Full code Family Communication: No family present Disposition Plan: Discharge home  Discharge Diagnoses:  Active Problems:   Diabetes mellitus out of control ([place])   Essential hypertension, benign   BMI 45.0-49.9, adult [facility])   Facial cellulitis  Discharge Instructions: Discharge  Instructions    Call MD for:  difficulty breathing, headache or visual disturbances   Complete by:  As directed    Call MD for:  extreme fatigue   Complete by:  As directed    Call MD for:  persistant dizziness or light-headedness   Complete by:  As directed    Increase activity slowly   Complete by:  As directed       Allergies as of 07/08/2018      Reactions   Bee Venom Anaphylaxis, Hives, Shortness Of Breath, Swelling   Penicillins Anaphylaxis   Has patient had a PCN reaction causing immediate rash, facial/tongue/throat swelling, SOB or lightheadedness with hypotension: yes Has patient had a PCN reaction causing severe rash involving mucus membranes or skin necrosis: unknown Has patient had a PCN reaction that required hospitalization : yes Has patient had a PCN reaction occurring within the last 10 years: no If all of the above answers are "NO", then may proceed with Cephalosporin use.      Medication List    STOP taking these medications   Blood Glucose Monitoring Suppl Kit   lidocaine 2 % solution Commonly known as:  XYLOCAINE   methylPREDNISolone 4 MG Tbpk tablet Commonly known as:  MEDROL DOSEPAK     TAKE these medications   acetaminophen 500 MG tablet Commonly known as:  TYLENOL Take 500-1,000 mg by mouth every 4 (four) hours as needed for mild pain or moderate pain.   amLODipine 5 MG tablet Commonly known as:  NORVASC Take 1 tablet (5 mg total) by mouth daily for 30 days. Start taking on:  July 09, 2018   blood glucose meter kit and supplies Dispense based on patient and insurance preference. Use up to four times daily as directed. (FOR ICD-10 E10.9, E11.9).   clindamycin 300 MG capsule Commonly known as:  CLEOCIN Take 1 capsule (300 mg total) by mouth 3 (three) times daily for 7 days.   ibuprofen 200 MG tablet Commonly known as:  ADVIL,MOTRIN Take 800 mg by mouth every 4 (four) hours as needed for mild pain or moderate pain.   Insulin Glargine 100 UNIT/ML Solostar Pen Commonly known as:  LANTUS SOLOSTAR Inject 40 Units into the skin daily.   insulin lispro 100 UNIT/ML KwikPen Commonly known as:  HUMALOG KWIKPEN Inject 0.08 mLs (8 Units total) into the skin 3 (three) times daily before meals.   Insulin Pen Needle 31G X 6 MM Misc 1 pen by Does not apply route as  directed.   ondansetron 4 MG disintegrating tablet Commonly known as:  ZOFRAN ODT Take 1 tablet (4 mg total) by mouth every 8 (eight) hours as needed for nausea or vomiting.   saccharomyces boulardii 250 MG capsule Commonly known as:  FLORASTOR Take 1 capsule (250 mg total) by mouth 2 (two) times daily for 30 days.      Follow-up Information    Patient [facility] [place] Follow up.   Why:  Call for assistance establishing primary care doctor.  Contact information: [phone]       Leta Baptist, MD. Schedule an appointment as soon as possible for a visit in 3 day(s).   Specialty:  Otolaryngology Why:  Hospital Follow UP  Contact information: [address] [place] [address] [phone]          Allergies  Allergen Reactions  . Bee Venom Anaphylaxis, Hives, Shortness Of Breath and Swelling  . Penicillins Anaphylaxis    Has patient had a PCN  reaction causing immediate rash, facial/tongue/throat swelling, SOB or lightheadedness with hypotension: yes Has patient had a PCN reaction causing severe rash involving mucus membranes or skin necrosis: unknown Has patient had a PCN reaction that required hospitalization : yes Has patient had a PCN reaction occurring within the last 10 years: no If all of the above answers are "NO", then may proceed with Cephalosporin use.    Allergies as of 07/08/2018      Reactions   Bee Venom Anaphylaxis, Hives, Shortness Of Breath, Swelling   Penicillins Anaphylaxis   Has patient had a PCN reaction causing immediate rash, facial/tongue/throat swelling, SOB or lightheadedness with hypotension: yes Has patient had a PCN reaction causing severe rash involving mucus membranes or skin necrosis: unknown Has patient had a PCN reaction that required hospitalization : yes Has patient had a PCN reaction occurring within the last 10 years: no If all of the above answers are "NO", then may proceed with Cephalosporin use.      Medication List     STOP taking these medications   Blood Glucose Monitoring Suppl Kit   lidocaine 2 % solution Commonly known as:  XYLOCAINE   methylPREDNISolone 4 MG Tbpk tablet Commonly known as:  MEDROL DOSEPAK     TAKE these medications   acetaminophen 500 MG tablet Commonly known as:  TYLENOL Take 500-1,000 mg by mouth every 4 (four) hours as needed for mild pain or moderate pain.   amLODipine 5 MG tablet Commonly known as:  NORVASC Take 1 tablet (5 mg total) by mouth daily for 30 days. Start taking on:  July 09, 2018   blood glucose meter kit and supplies Dispense based on patient and insurance preference. Use up to four times daily as directed. (FOR ICD-10 E10.9, E11.9).   clindamycin 300 MG capsule Commonly known as:  CLEOCIN Take 1 capsule (300 mg total) by mouth 3 (three) times daily for 7 days.   ibuprofen 200 MG tablet Commonly known as:  ADVIL,MOTRIN Take 800 mg by mouth every 4 (four) hours as needed for mild pain or moderate pain.   Insulin Glargine 100 UNIT/ML Solostar Pen Commonly known as:  LANTUS SOLOSTAR Inject 40 Units into the skin daily.   insulin lispro 100 UNIT/ML KwikPen Commonly known as:  HUMALOG KWIKPEN Inject 0.08 mLs (8 Units total) into the skin 3 (three) times daily before meals.   Insulin Pen Needle 31G X 6 MM Misc 1 pen by Does not apply route as directed.   ondansetron 4 MG disintegrating tablet Commonly known as:  ZOFRAN ODT Take 1 tablet (4 mg total) by mouth every 8 (eight) hours as needed for nausea or vomiting.   saccharomyces boulardii 250 MG capsule Commonly known as:  FLORASTOR Take 1 capsule (250 mg total) by mouth 2 (two) times daily for 30 days.       Procedures/Studies: Ct Maxillofacial W Contrast  Result Date: 07/06/2018 CLINICAL DATA:  Right facial cellulitis with persistent swelling on IV antibiotics. EXAM: CT MAXILLOFACIAL WITH CONTRAST TECHNIQUE: Multidetector CT imaging of the maxillofacial structures was performed with  intravenous contrast. Multiplanar CT image reconstructions were also generated. CONTRAST:  74m OMNIPAQUE IOHEXOL 300 MG/ML  SOLN COMPARISON:  07/02/2018 FINDINGS: Osseous: No acute fracture or interval osseous destruction is identified. Multiple dental caries are again noted with unchanged periapical lucencies involving left mandibular molar and left maxillary molar and premolar teeth. Recent extraction of both right maxillary premolar teeth is again noted. Orbits: Stable to slightly improved right preseptal soft  tissue swelling. No postseptal inflammation. Sinuses: Unchanged mild right maxillary sinus mucosal thickening. No fluid in the paranasal sinuses or mastoid air cells. Soft tissues: Inflammatory changes are again seen in the soft tissues lateral to the right maxilla over the tooth extraction sites with extension into the subcutaneous tissues throughout the face including extending inferior to the mandible with thickening of the platysma. Overall, the degree of soft tissue swelling has mildly improved. No organized fluid collection is identified. Small bilateral lymph nodes in the included cervical stations and parotid glands are similar to the prior study and likely reactive with the largest node measuring 11 mm in short axis in right level IB. Limited intracranial: Unremarkable. IMPRESSION: Mildly improved right-sided facial cellulitis. No organized fluid collection. Electronically Signed   By: Logan Bores M.D.   On: 07/06/2018 11:53   Ct Maxillofacial W Contrast  Result Date: 07/02/2018 CLINICAL DATA:  Dental caries. Recent dental work 06/25/2018. Right facial swelling EXAM: CT MAXILLOFACIAL WITH CONTRAST TECHNIQUE: Multidetector CT imaging of the maxillofacial structures was performed with intravenous contrast. Multiplanar CT image reconstructions were also generated. CONTRAST:  70m OMNIPAQUE IOHEXOL 300 MG/ML  SOLN COMPARISON:  CT face 04/19/2015 FINDINGS: Osseous: Multiple caries and multiple  periventricular bony erosion most notably in the left upper and lower molars. Recent extraction of right upper premolars. Negative for fracture Orbits: Normal orbit bilaterally. Moderate soft tissue swelling over the right eye extending into the right face compatible with cellulitis. Sinuses: Mild mucosal edema in the right maxillary sinus. Remaining sinuses clear Soft tissues: Soft tissue swelling right face diffusely extending down to the mandible. Soft tissue swelling overlying the right orbit. Findings compatible with cellulitis. No soft tissue abscess Scattered small lymph nodes in the neck bilaterally including the right parotid gland compatible with reactive adenopathy. Normal airway Limited intracranial: Negative IMPRESSION: Soft tissue swelling right face and orbit compatible cellulitis. No soft tissue mass. Apparent recent extraction of right upper premolars Poor dentition with multiple caries. Periapical lucencies around left upper and lower molars Electronically Signed   By: CFranchot GalloM.D.   On: 07/02/2018 16:43      Subjective: Pt says she is feeling a lot better and she wants to go home today.  She is not willing to stay any longer.    Discharge Exam: Vitals:   07/08/18 0558 07/08/18 1425  BP: 112/83 (!) 142/97  Pulse: 94 (!) 106  Resp: 18 18  Temp: 98.7 F (37.1 C) 98.9 F (37.2 C)  SpO2: 98% 100%   Vitals:   07/07/18 1314 07/07/18 2156 07/08/18 0558 07/08/18 1425  BP: 135/82 (!) 139/92 112/83 (!) 142/97  Pulse: 93 (!) 101 94 (!) 106  Resp: 18  18 18   Temp: 98.3 F (36.8 C) 99.1 F (37.3 C) 98.7 F (37.1 C) 98.9 F (37.2 C)  TempSrc: Oral Oral Oral Oral  SpO2: 100% 100% 98% 100%  Weight:      Height:       General exam: Alert, awake, oriented x 3 HEENT: markedly improved swollen induration on right side of face. Induration over right cheek with improving erythema and much less tenderness.   Respiratory system: Clear to auscultation. Respiratory effort  normal. Cardiovascular system: normal s1,s2 sounds. No murmurs, rubs, gallops. Gastrointestinal system: Abdomen is nondistended, soft and nontender. No organomegaly or masses felt. Normal bowel sounds heard. Central nervous system: Alert and oriented. No focal neurological deficits. Extremities: No C/C/E, +pedal pulses Skin: No rashes, lesions or ulcers Psychiatry: Judgement and insight  appear normal. Mood & affect appropriate.    The results of significant diagnostics from this hospitalization (including imaging, microbiology, ancillary and laboratory) are listed below for reference.     Microbiology: Recent Results (from the past 240 hour(s))  Culture, blood (routine x 2)     Status: None   Collection Time: 07/02/18  6:41 PM  Result Value Ref Range Status   Specimen Description RIGHT ANTECUBITAL  Final   Special Requests   Final    BOTTLES DRAWN AEROBIC AND ANAEROBIC Blood Culture adequate volume   Culture   Final    NO GROWTH 5 DAYS Performed at [facility], [address] [place], [place] [address]    Report Status 07/07/2018 FINAL  Final  Culture, blood (routine x 2)     Status: None   Collection Time: 07/02/18  6:42 PM  Result Value Ref Range Status   Specimen Description BLOOD RIGHT HAND  Final   Special Requests   Final    BOTTLES DRAWN AEROBIC AND ANAEROBIC Blood Culture adequate volume   Culture   Final    NO GROWTH 5 DAYS Performed at [facility], [address], [place], [place] [address]    Report Status 07/07/2018 FINAL  Final     Labs: BNP (last 3 results) No results for input(s): BNP in the last 8760 hours. Basic Metabolic Panel: Recent Labs  Lab 07/02/18 1336 07/03/18 0617 07/05/18 0719 07/06/18 1058 07/07/18 0620  NA 131* 132* 136 135 135  K 4.3 4.1 3.9 4.2 4.4  CL 94* 100 107 104 103  CO2 27 21* 22 24 25   GLUCOSE 331* 336* 254* 222* 212*  BUN 12 17 27* 13 13  CREATININE 0.74 0.71 0.83 0.62 0.74  CALCIUM 8.9 8.2* 7.7* 8.0* 8.1*  MG  --   --    --   --  1.9   Liver Function Tests: Recent Labs  Lab 07/07/18 0620  AST 20  ALT 17  ALKPHOS 78  BILITOT 0.3  PROT 6.2*  ALBUMIN 2.4*   No results for input(s): LIPASE, AMYLASE in the last 168 hours. No results for input(s): AMMONIA in the last 168 hours. CBC: Recent Labs  Lab 07/02/18 1336 07/03/18 0617 07/05/18 0719 07/06/18 1058 07/07/18 0620  WBC 17.0* 14.7* 11.1* 12.4* 11.1*  NEUTROABS 12.6*  --   --   --  6.4  HGB 12.4 10.6* 9.3* 10.3* 10.1*  HCT 37.7 32.6* 29.1* 32.0* 31.3*  MCV 87.1 86.9 89.0 90.4 90.5  PLT 358 312 261 308 298   Cardiac Enzymes: No results for input(s): CKTOTAL, CKMB, CKMBINDEX, TROPONINI in the last 168 hours. BNP: Invalid input(s): POCBNP CBG: Recent Labs  Lab 07/07/18 1145 07/07/18 1559 07/07/18 2158 07/08/18 0739 07/08/18 1114  GLUCAP 238* 156* 218* 180* 166*   D-Dimer No results for input(s): DDIMER in the last 72 hours. Hgb A1c No results for input(s): HGBA1C in the last 72 hours. Lipid Profile No results for input(s): CHOL, HDL, LDLCALC, TRIG, CHOLHDL, LDLDIRECT in the last 72 hours. Thyroid function studies No results for input(s): TSH, T4TOTAL, T3FREE, THYROIDAB in the last 72 hours.  Invalid input(s): FREET3 Anemia work up No results for input(s): VITAMINB12, FOLATE, FERRITIN, TIBC, IRON, RETICCTPCT in the last 72 hours. Urinalysis    Component Value Date/Time   COLORURINE YELLOW 06/16/2017 1515   APPEARANCEUR HAZY (A) 06/16/2017 1515   LABSPEC 1.030 06/16/2017 1515   PHURINE 5.0 06/16/2017 1515   GLUCOSEU >=500 (A) 06/16/2017 1515  HGBUR MODERATE (A) 06/16/2017 1515   BILIRUBINUR negative 08/13/2017 1622   KETONESUR negative 08/13/2017 1622   KETONESUR 20 (A) 06/16/2017 1515   PROTEINUR =100 (A) 08/13/2017 1622   PROTEINUR 100 (A) 06/16/2017 1515   UROBILINOGEN 0.2 08/13/2017 1622   UROBILINOGEN 0.2 11/25/2014 0553   NITRITE Negative 08/13/2017 1622   NITRITE NEGATIVE 06/16/2017 1515   LEUKOCYTESUR Small  (1+) (A) 08/13/2017 1622   Sepsis Labs Invalid input(s): PROCALCITONIN,  WBC,  LACTICIDVEN Microbiology Recent Results (from the past 240 hour(s))  Culture, blood (routine x 2)     Status: None   Collection Time: 07/02/18  6:41 PM  Result Value Ref Range Status   Specimen Description RIGHT ANTECUBITAL  Final   Special Requests   Final    BOTTLES DRAWN AEROBIC AND ANAEROBIC Blood Culture adequate volume   Culture   Final    NO GROWTH 5 DAYS Performed at [facility], [address], [place], [place] [address]    Report Status 07/07/2018 FINAL  Final  Culture, blood (routine x 2)     Status: None   Collection Time: 07/02/18  6:42 PM  Result Value Ref Range Status   Specimen Description BLOOD RIGHT HAND  Final   Special Requests   Final    BOTTLES DRAWN AEROBIC AND ANAEROBIC Blood Culture adequate volume   Culture   Final    NO GROWTH 5 DAYS Performed at [facility], [address], [place], [place] [address]    Report Status 07/07/2018 FINAL  Final    Time coordinating discharge: 33 minutes   SIGNED:  Irwin Brakeman, MD  [facility] 07/08/2018, 3:02 PM How to contact the [facility] Attending or Consulting provider [place] or covering provider during after hours [place], for this patient?  1. Check the care team in [place] and look for a) attending/consulting [facility] provider listed and b) the [facility] team listed 2. Log into [email or web address] and use [place]'s universal password to access. If you do not have the password, please contact the hospital operator. 3. Locate the [facility] provider you are looking for under [facility] and page to a number that you can be directly reached. 4. If you still have difficulty reaching the provider, please page the [facility] (Director on Call) for the Hospitalists listed on amion for assistance.

## 2018-07-08 NOTE — Discharge Instructions (Signed)
IMPORTANT INFORMATION: PAY CLOSE ATTENTION   PHYSICIAN DISCHARGE INSTRUCTIONS  Follow with Primary care provider  Patient, No Pcp Per  and other consultants as instructed your Hospitalist Physician  [facility] IF SYMPTOMS COME BACK, WORSEN OR NEW PROBLEM DEVELOPS.   Please note: You were cared for by a hospitalist during your hospital stay. Every effort will be made to forward records to your primary care provider.  You can request that your primary care provider send for your hospital records if they have not received them.  Once you are discharged, your primary care physician will handle any further medical issues. Please note that NO REFILLS for any discharge medications will be authorized once you are discharged, as it is imperative that you return to your primary care physician (or establish a relationship with a primary care physician if you do not have one) for your post hospital discharge needs so that they can reassess your need for medications and monitor your lab values.  Please get a complete blood count and chemistry panel checked by your Primary MD at your next visit, and again as instructed by your Primary MD.  Get Medicines reviewed and adjusted: Please take all your medications with you for your next visit with your Primary MD  Laboratory/radiological data: Please request your Primary MD to go over all hospital tests and procedure/radiological results at the follow up, please ask your primary care provider to get all Hospital records sent to his/her office.  In some cases, they will be blood work, cultures and biopsy results pending at the time of your discharge. Please request that your primary care provider follow up on these results.  If you are diabetic, please bring your blood sugar readings with you to your follow up appointment with primary care.    Please call and make your follow up appointments as soon as possible.    Also Note  the following: If you experience worsening of your admission symptoms, develop shortness of breath, life threatening emergency, suicidal or homicidal thoughts you must seek medical attention immediately by calling 911 or calling your MD immediately  if symptoms less severe.  You must read complete instructions/literature along with all the possible adverse reactions/side effects for all the Medicines you take and that have been prescribed to you. Take any new Medicines after you have completely understood and accpet all the possible adverse reactions/side effects.   Do not drive when taking Pain medications or sleeping medications (Benzodiazepines)  Do not take more than prescribed Pain, Sleep and Anxiety Medications. It is not advisable to combine anxiety,sleep and pain medications without talking with your primary care practitioner  Special Instructions: If you have smoked or chewed Tobacco  in the last 2 yrs please stop smoking, stop any regular Alcohol  and or any Recreational drug use.  Wear Seat belts while driving.   Blood Glucose Monitoring, Adult Monitoring your blood sugar (glucose) is an important part of managing your diabetes (diabetes mellitus). Blood glucose monitoring involves checking your blood glucose as often as directed and keeping a record (log) of your results over time. Checking your blood glucose regularly and keeping a blood glucose log can:  Help you and your health care provider adjust your diabetes management plan as needed, including your medicines or insulin.  Help you understand how food, exercise, illnesses, and medicines affect your blood glucose.  Let you know what your blood glucose is at any time. You can quickly find out  if you have low blood glucose (hypoglycemia) or high blood glucose (hyperglycemia). Your health care provider will set individualized treatment goals for you. Your goals will be based on your age, other medical conditions you have, and how  you respond to diabetes treatment. Generally, the goal of treatment is to maintain the following blood glucose levels:  Before meals (preprandial): 80-130 mg/dL (4.4-7.2 mmol/L).  After meals (postprandial): below 180 mg/dL (10 mmol/L).  A1c level: less than 7%. Supplies needed:  Blood glucose meter.  Test strips for your meter. Each meter has its own strips. You must use the strips that came with your meter.  A needle to prick your finger (lancet). Do not use a lancet more than one time.  A device that holds the lancet (lancing device).  A journal or log book to write down your results. How to check your blood glucose  1. Wash your hands with soap and water. 2. Prick the side of your finger (not the tip) with the lancet. Use a different finger each time. 3. Gently rub the finger until a small drop of blood appears. 4. Follow instructions that come with your meter for inserting the test strip, applying blood to the strip, and using your blood glucose meter. 5. Write down your result and any notes. Some meters allow you to use areas of your body other than your finger (alternative sites) to test your blood. The most common alternative sites are:  Forearm.  Thigh.  Palm of the hand. If you think you may have hypoglycemia, or if you have a history of not knowing when your blood glucose is getting low (hypoglycemia unawareness), do not use alternative sites. Use your finger instead. Alternative sites may not be as accurate as the fingers, because blood flow is slower in these areas. This means that the result you get may be delayed, and it may be different from the result that you would get from your finger. Follow these instructions at home: Blood glucose log   Every time you check your blood glucose, write down your result. Also write down any notes about things that may be affecting your blood glucose, such as your diet and exercise for the day. This information can help you and  your health care provider: ? Look for patterns in your blood glucose over time. ? Adjust your diabetes management plan as needed.  Check if your meter allows you to download your records to a computer. Most glucose meters store a record of glucose readings in the meter. If you have type 1 diabetes:  Check your blood glucose 2 or more times a day.  Also check your blood glucose: ? Before every insulin injection. ? Before and after exercise. ? Before meals. ? 2 hours after a meal. ? Occasionally between 2:00 a.m. and 3:00 a.m., as directed. ? Before potentially dangerous tasks, like driving or using heavy machinery. ? At bedtime.  You may need to check your blood glucose more often, up to 6-10 times a day, if you: ? Use an insulin pump. ? Need multiple daily injections (MDI). ? Have diabetes that is not well-controlled. ? Are ill. ? Have a history of severe hypoglycemia. ? Have hypoglycemia unawareness. If you have type 2 diabetes:  If you take insulin or other diabetes medicines, check your blood glucose 2 or more times a day.  If you are on intensive insulin therapy, check your blood glucose 4 or more times a day. Occasionally, you may also need to  check between 2:00 a.m. and 3:00 a.m., as directed.  Also check your blood glucose: ? Before and after exercise. ? Before potentially dangerous tasks, like driving or using heavy machinery.  You may need to check your blood glucose more often if: ? Your medicine is being adjusted. ? Your diabetes is not well-controlled. ? You are ill. General tips  Always keep your supplies with you.  If you have questions or need help, all blood glucose meters have a 24-hour "hotline" phone number that you can call. You may also contact your health care provider.  After you use a few boxes of test strips, adjust (calibrate) your blood glucose meter by following instructions that came with your meter. Contact a health care provider if:  Your  blood glucose is at or above 240 mg/dL (13.3 mmol/L) for 2 days in a row.  You have been sick or have had a fever for 2 days or longer, and you are not getting better.  You have any of the following problems for more than 6 hours: ? You cannot eat or drink. ? You have nausea or vomiting. ? You have diarrhea. Get help right away if:  Your blood glucose is lower than 54 mg/dL (3 mmol/L).  You become confused or you have trouble thinking clearly.  You have difficulty breathing.  You have moderate or large ketone levels in your urine. Summary  Monitoring your blood sugar (glucose) is an important part of managing your diabetes (diabetes mellitus).  Blood glucose monitoring involves checking your blood glucose as often as directed and keeping a record (log) of your results over time.  Your health care provider will set individualized treatment goals for you. Your goals will be based on your age, other medical conditions you have, and how you respond to diabetes treatment.  Every time you check your blood glucose, write down your result. Also write down any notes about things that may be affecting your blood glucose, such as your diet and exercise for the day. This information is not intended to replace advice given to you by your health care provider. Make sure you discuss any questions you have with your health care provider. Document Released: 04/26/2003 Document Revised: 03/04/2017 Document Reviewed: 10/03/2015 Elsevier Interactive Patient Education  2019 [employer]   Cellulitis, Adult  Cellulitis is a skin infection. The infected area is often warm, red, swollen, and sore. It occurs most often in the arms and lower legs. It is very important to get treated for this condition. What are the causes? This condition is caused by bacteria. The bacteria enter through a break in the skin, such as a cut, burn, insect bite, open sore, or crack. What increases the risk? This condition is  more likely to occur in people who:  Have a weak body defense system (immune system).  Have open cuts, burns, bites, or scrapes on the skin.  Are older than 31 years of age.  Have a blood sugar problem (diabetes).  Have a long-lasting (chronic) liver disease (cirrhosis) or kidney disease.  Are very overweight (obese).  Have a skin problem, such as: ? Itchy rash (eczema). ? Slow movement of blood in the veins (venous stasis). ? Fluid buildup below the skin (edema).  Have been treated with high-energy rays (radiation).  Use IV drugs. What are the signs or symptoms? Symptoms of this condition include:  Skin that is: ? Red. ? Streaking. ? Spotting. ? Swollen. ? Sore or painful when you touch it. ?  Warm.  A fever.  Chills.  Blisters. How is this diagnosed? This condition is diagnosed based on:  Medical history.  Physical exam.  Blood tests.  Imaging tests. How is this treated? Treatment for this condition may include:  Medicines to treat infections or allergies.  Home care, such as: ? Rest. ? Placing cold or warm cloths (compresses) on the skin.  Hospital care, if the condition is very bad. Follow these instructions at home: Medicines  Take over-the-counter and prescription medicines only as told by your doctor.  If you were prescribed an antibiotic medicine, take it as told by your doctor. Do not stop taking it even if you start to feel better. General instructions   Drink enough fluid to keep your pee (urine) pale yellow.  Do not touch or rub the infected area.  Raise (elevate) the infected area above the level of your heart while you are sitting or lying down.  Place cold or warm cloths on the area as told by your doctor.  Keep all follow-up visits as told by your doctor. This is important. Contact a doctor if:  You have a fever.  You do not start to get better after 1-2 days of treatment.  Your bone or joint under the infected area  starts to hurt after the skin has healed.  Your infection comes back. This can happen in the same area or another area.  You have a swollen bump in the area.  You have new symptoms.  You feel ill and have muscle aches and pains. Get help right away if:  Your symptoms get worse.  You feel very sleepy.  You throw up (vomit) or have watery poop (diarrhea) for a long time.  You see red streaks coming from the area.  Your red area gets larger.  Your red area turns dark in color. These symptoms may represent a serious problem that is an emergency. Do not wait to see if the symptoms will go away. Get medical help right away. Call your local emergency services (911 in the [place] Do not drive yourself to the hospital. Summary  Cellulitis is a skin infection. The area is often warm, red, swollen, and sore.  This condition is treated with medicines, rest, and cold and warm cloths.  Take all medicines only as told by your doctor.  Tell your doctor if symptoms do not start to get better after 1-2 days of treatment. This information is not intended to replace advice given to you by your health care provider. Make sure you discuss any questions you have with your health care provider. Document Released: 10/10/2007 Document Revised: 09/12/2017 Document Reviewed: 09/12/2017 Elsevier Interactive Patient Education  2019 [place].   Hyperglycemia Hyperglycemia is when the sugar (glucose) level in your blood is too high. It may not cause symptoms. If you do have symptoms, they may include warning signs, such as:  Feeling more thirsty than normal.  Hunger.  Feeling tired.  Needing to pee (urinate) more than normal.  Blurry eyesight (vision). You may get other symptoms as it gets worse, such as:  Dry mouth.  Not being hungry (loss of appetite).  Fruity-smelling breath.  Weakness.  Weight gain or loss that is not planned. Weight loss may be fast.  A tingling or numb feeling  in your hands or feet.  Headache.  Skin that does not bounce back quickly when it is lightly pinched and released (poor skin turgor).  Pain in your belly (abdomen).  Cuts or  bruises that heal slowly. High blood sugar can happen to people who do or do not have diabetes. High blood sugar can happen slowly or quickly, and it can be an emergency. Follow these instructions at home: General instructions  Take over-the-counter and prescription medicines only as told by your doctor.  Do not use products that contain nicotine or tobacco, such as cigarettes and e-cigarettes. If you need help quitting, ask your doctor.  Limit alcohol intake to no more than 1 drink per day for nonpregnant women and 2 drinks per day for men. One drink equals 12 oz of beer, 5 oz of wine, or 1 oz of hard liquor.  Manage stress. If you need help with this, ask your doctor.  Keep all follow-up visits as told by your doctor. This is important. Eating and drinking   Stay at a healthy weight.  Exercise regularly, as told by your doctor.  Drink enough fluid, especially when you: ? Exercise. ? Get sick. ? Are in hot temperatures.  Eat healthy foods, such as: ? Low-fat (lean) proteins. ? Complex carbs (complex carbohydrates), such as whole wheat bread or brown rice. ? Fresh fruits and vegetables. ? Low-fat dairy products. ? Healthy fats.  Drink enough fluid to keep your pee (urine) clear or pale yellow. If you have diabetes:   Make sure you know the symptoms of hyperglycemia.  Follow your diabetes management plan, as told by your doctor. Make sure you: ? Take insulin and medicines as told. ? Follow your exercise plan. ? Follow your meal plan. Eat on time. Do not skip meals. ? Check your blood sugar as often as told. Make sure to check before and after exercise. If you exercise longer or in a different way than you normally do, check your blood sugar more often. ? Follow your sick day plan whenever you  cannot eat or drink normally. Make this plan ahead of time with your doctor.  Share your diabetes management plan with people in your workplace, school, and household.  Check your urine for ketones when you are ill and as told by your doctor.  Carry a card or wear jewelry that says that you have diabetes. Contact a doctor if:  Your blood sugar level is higher than 240 mg/dL (13.3 mmol/L) for 2 days in a row.  You have problems keeping your blood sugar in your target range.  High blood sugar happens often for you. Get help right away if:  You have trouble breathing.  You have a change in how you think, feel, or act (mental status).  You feel sick to your stomach (nauseous), and that feeling does not go away.  You cannot stop throwing up (vomiting). These symptoms may be an emergency. Do not wait to see if the symptoms will go away. Get medical help right away. Call your local emergency services (911 in the [place] Do not drive yourself to the hospital. Summary  Hyperglycemia is when the sugar (glucose) level in your blood is too high.  High blood sugar can happen to people who do or do not have diabetes.  Make sure you drink enough fluids, eat healthy foods, and exercise regularly.  Contact your doctor if you have problems keeping your blood sugar in your target range. This information is not intended to replace advice given to you by your health care provider. Make sure you discuss any questions you have with your health care provider. Document Released: 02/18/2009 Document Revised: 01/09/2016 Document Reviewed: 01/09/2016 Elsevier Interactive  Patient Education  2019 [place].   Insulin Treatment for Diabetes Mellitus Diabetes (diabetes mellitus) is a long-term (chronic) disease. It occurs when the body does not properly use sugar (glucose) that is released from food after digestion. Glucose levels are controlled by a hormone called insulin. Insulin is made in the pancreas,  which is an organ behind the stomach.  If you have type 1 diabetes, you must take insulin because your pancreas does not make any.  If you have type 2 diabetes, you might need to take insulin along with other medicines. In type 2 diabetes, one or both of these problems may be present: ? The pancreas does not make enough insulin. ? Cells in the body do not respond properly to insulin that the body makes (insulin resistance). You must use insulin correctly to control your diabetes. You must have some insulin in your body at all times. Insulin treatment varies depending on your type of diabetes, your treatment goals, and your medical history. Ask questions to understand your insulin treatment plan so you can be an active partner in managing your diabetes. How is insulin given? Insulin can only be given through a shot (injection). It is injected using a syringe and needle, an insulin pen, a pump, or a jet injector. Your health care provider will:  Prescribe the type and amount of insulin that you need.  Tell you when you should inject your insulin. Where on the body should insulin be injected? Insulin is injected into a layer of fatty tissue under the skin. Good places to inject insulin include:  Abdomen. Generally, the abdomen is the best place to inject insulin. However, you should avoid any area that is less than 2 inches (5 cm) from the belly button (navel).  Front of thigh.  Upper, outer side of thigh.  Upper, outer side of arm.  Upper, outer part of buttock. It is important to:  Give your injection in a slightly different place each time. This helps to prevent irritation and improve absorption.  Avoid injecting into areas that have scar tissue. Usually, you will give yourself insulin injections. Others can also be taught how to give you injections. You will use a special type of syringe that is made only for insulin. Some people may have an insulin pump that delivers insulin steadily  through a tube (cannula) that is placed under the skin. What are the different types of insulin? The following information is a general guide to different types of insulin. Specifics vary depending on the insulin product that your health care provider prescribes.  Rapid-acting insulin: ? Starts working quickly, in as little as 5 minutes. ? Can last for 4-6 hours (or sometimes longer). ? Works well when taken right before a meal to quickly lower your blood glucose.  Short-acting insulin: ? Starts working in about 30 minutes. ? Can last for 6-10 hours. ? Should be taken about 30 minutes before you start eating a meal.  Intermediate-acting insulin: ? Starts working in 1-2 hours. ? Lasts for about 10-18 hours. ? Lowers your blood glucose for a longer period of time, but it is not as effective for lowering blood glucose right after a meal.  Long-acting insulin: ? Mimics the small amount of insulin that your pancreas usually produces throughout the day. ? Should be used one or two times a day. ? Is usually used in combination with other types of insulin or other medicines.  Concentrated insulin, or U-500 insulin: ? Contains a higher dose  of insulin than most rapid-acting insulins. U-500 insulin has 5 times the amount of insulin per 1 mL. ? Should only be used with the special U-500 syringe or U-500 insulin pen. It is dangerous to use the wrong type of syringe with this insulin. What are the side effects of insulin? Possible side effects of insulin treatment include:  Low blood glucose (hypoglycemia).  Weight gain.  High blood glucose (hyperglycemia).  Skin injury or irritation. Some of these side effects can be caused by using improper injection technique. Be sure to learn how to inject insulin properly. What are common terms associated with insulin treatment? Some terms that you might hear include:  Basal insulin, or basal rate. This is the constant amount of insulin that needs to  be present in your body to keep your blood glucose levels stable. People who have type 1 diabetes need basal insulin in a steady (continuous) dose 24 hours a day. ? Usually, intermediate-acting or long-acting insulin is used one or two times a day to manage basal insulin levels. ? Medicines that are taken by mouth may also be recommended to manage basal insulin levels.  Prandial or nutrition insulin. This refers to meal-related insulin. ? Blood glucose rises quickly after a meal (postprandial). Rapid-acting or short-acting insulin can be used right before a meal (preprandial) to quickly lower your blood glucose. ? You may be instructed to adjust the amount of prandial insulin that you take, based on how much carbohydrate (starch) is in your meal.  Corrective insulin. This may also be called a correction dose or supplemental dose. This is a small amount of rapid-acting or short-acting insulin that can be used to lower your blood glucose if it is too high. You may be instructed to check your blood glucose at certain times of the day and use corrective insulin as needed.  Tight control, or intensive therapy. This means keeping your blood glucose as close to your target as possible, and preventing your blood glucose from getting too high after meals. People who have tight control of their diabetes have fewer long-term problems caused by diabetes. Follow these instructions at home: Talk with your health care provider or pharmacist about the type of insulin you should take and when you should take it. You should know when your insulin goes up the most (peaks) and when it wears off. You need this information so you can plan your meals and exercise. Work with your health care provider to:  Check your blood glucose every day. Your health care provider will tell you how often and when you should do this.  Manage your: ? Weight. ? Blood pressure. ? Cholesterol. ? Stress.  Eat a healthy diet.  Exercise  regularly. Summary  Diabetes is a long-term (chronic) disease. It occurs when the body does not properly use sugar (glucose) that is released from food after digestion. Glucose levels are controlled by a hormone called insulin, which is made in an organ behind your stomach (pancreas).  You must use insulin correctly to control your diabetes. You must have some insulin in your body at all times.  Insulin treatment varies depending on your type of diabetes, your treatment goals, and your medical history.  Talk with your health care provider or pharmacist about the type of insulin you should take and when you should take it.  Check your blood glucose every day. Your health care provider will tell you how often and when you should check it. This information is not intended  to replace advice given to you by your health care provider. Make sure you discuss any questions you have with your health care provider. Document Released: 07/20/2008 Document Revised: 11/14/2016 Document Reviewed: 05/27/2015 Elsevier Interactive Patient Education  2019 [employer]   Preventing Diabetes Mellitus Complications You can take action to prevent or slow down problems that are caused by diabetes (diabetes mellitus). Following your diabetes plan and taking care of yourself can reduce your risk of serious or life-threatening complications. What actions can I take to prevent diabetes complications? Manage your diabetes   Follow instructions from your health care providers about managing your diabetes. Your diabetes may be managed by a team of health care providers who can teach you how to care for yourself and can answer questions that you have.  Educate yourself about your condition so you can make healthy choices about eating and physical activity.  Check your blood sugar (glucose) levels as often as directed. Your health care provider will help you decide how often to check your blood glucose level depending on  your treatment goals and how well you are meeting them.  Ask your health care provider if you should take low-dose aspirin daily and what dose is recommended for you. Taking low-dose aspirin daily is recommended to help prevent cardiovascular disease. Do not use nicotine or tobacco Do not use any products that contain nicotine or tobacco, such as cigarettes and e-cigarettes. If you need help quitting, ask your health care provider. Nicotine raises your risk for diabetes problems. If you quit using nicotine:  You will lower your risk for heart attack, stroke, nerve disease, and kidney disease.  Your cholesterol and blood pressure may improve.  Your blood circulation will improve. Keep your blood pressure under control Your personal target blood pressure is determined based on:  Your age.  Your medicines.  How long you have had diabetes.  Any other medical conditions you have. To control your blood pressure:  Follow instructions from your health care provider about meal planning, exercise, and medicines.  Make sure your health care provider checks your blood pressure at every medical visit.  Monitor your blood pressure at home as told by your health care provider.  Keep your cholesterol under control To control your cholesterol:  Follow instructions from your health care provider about meal planning, exercise, and medicines.  Have your cholesterol checked at least once a year.  You may be prescribed medicine to lower cholesterol (statin). If you are not taking a statin, ask your health care provider if you should be. Controlling your cholesterol may:  Help prevent heart disease and stroke. These are the most common health problems for people with diabetes.  Improve your blood flow. Schedule and keep yearly physical exams and eye exams Your health care provider will tell you how often you need medical visits depending on your diabetes management plan. Keep all follow-up visits  as directed. This is important so possible problems can be identified early and complications can be avoided or treated.  Every visit with your health care provider should include measuring your: ? Weight. ? Blood pressure. ? Blood glucose control.  Your A1c (hemoglobin A1c) level should be checked: ? At least 2 times a year, if you are meeting your treatment goals. ? 4 times a year, if you are not meeting treatment goals or if your treatment goals have changed.  Your blood lipids (lipid profile) should be checked yearly. You should also be checked yearly for protein in your  urine (urine microalbumin).  If you have type 1 diabetes, get an eye exam 3-5 years after you are diagnosed, and then once a year after your first exam.  If you have type 2 diabetes, get an eye exam as soon as you are diagnosed, and then once a year after your first exam. Keep your vaccines current It is recommended that you receive:  A flu (influenza) vaccine every year.  A pneumonia (pneumococcal) vaccine and a hepatitis B vaccine. If you are age 13 or older, you may get the pneumonia vaccine as a series of two separate shots. Ask your health care provider which other vaccines may be recommended. Take care of your feet Diabetes may cause you to have poor blood circulation to your legs and feet. Because of this, taking care of your feet is very important. Diabetes can cause:  The skin on the feet to get thinner, break more easily, and heal more slowly.  Nerve damage in your legs and feet, which results in decreased feeling. You may not notice minor injuries that could lead to serious problems. To avoid foot problems:  Check your skin and feet every day for cuts, bruises, redness, blisters, or sores.  Schedule a foot exam with your health care provider once every year. This exam includes: ? Inspecting of the structure and skin of your feet. ? Checking the pulses and sensation in your feet.  Make sure that  your health care provider performs a visual foot exam at every medical visit.  Take care of your teeth People with poorly controlled diabetes are more likely to have gum (periodontal) disease. Diabetes can make periodontal diseases harder to control. If not treated, periodontal diseases can lead to tooth loss. To prevent this:  Brush your teeth twice a day.  Floss at least once a day.  Visit your dentist 2 times a year. Drink responsibly Limit alcohol intake to no more than 1 drink a day for nonpregnant women and 2 drinks a day for men. One drink equals 12 oz of beer, 5 oz of wine, or 1 oz of hard liquor.  It is important to eat food when you drink alcohol to avoid low blood glucose (hypoglycemia). Avoid alcohol if you:  Have a history of alcohol abuse or dependence.  Are pregnant.  Have liver disease, pancreatitis, advanced neuropathy, or severe hypertriglyceridemia. Lessen stress Living with diabetes can be stressful. When you are experiencing stress, your blood glucose may be affected in two ways:  Stress hormones may cause your blood glucose to rise.  You may be distracted from taking good care of yourself. Be aware of your stress level and make changes to help you manage challenging situations. To lower your stress levels:  Consider joining a support group.  Do planned relaxation or meditation.  Do a hobby that you enjoy.  Maintain healthy relationships.  Exercise regularly.  Work with your health care provider or a mental health professional. Summary  You can take action to prevent or slow down problems that are caused by diabetes (diabetes mellitus). Following your diabetes plan and taking care of yourself can reduce your risk of serious or life-threatening complications.  Follow instructions from your health care providers about managing your diabetes. Your diabetes may be managed by a team of health care providers who can teach you how to care for yourself and can  answer questions that you have.  Your health care provider will tell you how often you need medical visits depending on your  diabetes management plan. Keep all follow-up visits as directed. This is important so possible problems can be identified early and complications can be avoided or treated. This information is not intended to replace advice given to you by your health care provider. Make sure you discuss any questions you have with your health care provider. Document Released: 01/09/2011 Document Revised: 12/11/2016 Document Reviewed: 01/21/2016 Elsevier Interactive Patient Education  2019 [employer].

## 2018-07-09 ENCOUNTER — Other Ambulatory Visit: Payer: Self-pay | Admitting: *Deleted

## 2018-07-09 MED FILL — LANTUS SOLOSTAR 100 UNITS/M: 100 | 30 days supply | Qty: 12 | Fill #0

## 2018-07-09 NOTE — Patient Outreach (Signed)
[facility]) Care Management  07/09/2018  Sheila Austin 04/16/1986 [id number]   Transition of care telephone call  Referral received: 07/03/18 Initial outreach: 07/09/18 Insurance: [place]   Initial unsuccessful telephone call to patient's preferred number in order to complete transition of care assessment; no answer, left HIPAA compliant voicemail message requesting return call. Also, sent secure HIPAA compliant e-mail to [facility] e-mail address requesting she contact this RNCM.   Objective: Per the electronic medical record, Sheila Austin was hospitalized at [facility] from 2/26-/07/08/18 for facial cellulitis. She was also profoundly hyperglycemia with admission glucose 331 and Hgb A1C 13.6 on 07/03/18.  Comorbidities include: Type 2 DM, HTN and morbid obesity. She was discharged to home on 07/08/18 without the need for home health services or durable medical equipment per the discharge summary.   Plan: If no return call from patient by the end of business day today, this RNCM will route unsuccessful outreach letter with [facility] Management pamphlet and 24 hour Nurse Advice Line Magnet to [place] [facility] clinical pool to be mailed to patient's home address. This RNCM will attempt another outreach within 4 business days.   Barrington Ellison RN,CCM,CDE [place] Management Coordinator Office Phone [phone] Office Fax [phone]

## 2018-07-14 ENCOUNTER — Other Ambulatory Visit: Payer: Self-pay | Admitting: *Deleted

## 2018-07-14 NOTE — Patient Outreach (Signed)
[facility]) Care Management  07/14/2018  Sheila Austin November 11, 1985 [id number]  Received the following return e-mail from [place] today at 8:45 am: Sheila Austin,  Barnie Del me for not returning a call sooner. My phone has been ringing off the hook with calls and messages regarding work, FMLA, etc. I was able to get all of my medications with no problem. Insulin was my biggest concern, but that was actually not even a problem! I was able to use the savings card (good for one full year) for my Lantus and the other insulin for meal coverage was not expensive at all. I was very pleased!  [place] Phone: [phone] Fax: [phone]  Reply email sent to [facility] providing information on [place]'s disease management programs and pharmacy benefits associated with each program and asked Brittnei to contact this RNCM if interested. She replied that she was interested so directed her to contact this RNCM to discuss. Await contact from [place] RN,CCM,CDE [place] Management Coordinator Office Phone [phone] Office Fax [phone]

## 2018-07-21 ENCOUNTER — Other Ambulatory Visit: Payer: Self-pay | Admitting: *Deleted

## 2018-07-21 NOTE — Patient Outreach (Signed)
[facility]) Care Management  07/21/2018  Sheila Austin 1986-04-08 [id number]   Closing case to [facility] services as Sheila Austin was securely e-mailed information on 3/8 on the two [place] disease management programs for insurance plan members. Sheila Austin was encouraged to contact this RNCM for further questions. She has not contacted this RNCM.  Barrington Ellison RN,CCM,CDE [place] Management Coordinator Office Phone [phone] Office Fax [phone]

## 2018-09-09 ENCOUNTER — Telehealth: Payer: 59 | Admitting: Family Medicine

## 2018-09-10 ENCOUNTER — Encounter: Payer: Self-pay | Admitting: Registered Nurse

## 2018-09-10 ENCOUNTER — Telehealth (INDEPENDENT_AMBULATORY_CARE_PROVIDER_SITE_OTHER): Payer: 59 | Admitting: Registered Nurse

## 2018-09-10 ENCOUNTER — Other Ambulatory Visit: Payer: Self-pay

## 2018-09-10 VITALS — Ht 62.0 in | Wt 264.0 lb

## 2018-09-10 DIAGNOSIS — Z7689 Persons encountering health services in other specified circumstances: Secondary | ICD-10-CM

## 2018-09-10 DIAGNOSIS — F4321 Adjustment disorder with depressed mood: Secondary | ICD-10-CM

## 2018-09-10 DIAGNOSIS — E1169 Type 2 diabetes mellitus with other specified complication: Secondary | ICD-10-CM

## 2018-09-10 DIAGNOSIS — F4329 Adjustment disorder with other symptoms: Secondary | ICD-10-CM

## 2018-09-10 DIAGNOSIS — Z634 Disappearance and death of family member: Secondary | ICD-10-CM

## 2018-09-10 DIAGNOSIS — Z6841 Body Mass Index (BMI) 40.0 and over, adult: Secondary | ICD-10-CM

## 2018-09-10 MED ORDER — ESCITALOPRAM OXALATE 10 MG PO TABS: 10.0000 mg | ORAL_TABLET | Freq: Every day | ORAL | 0 refills | Status: DC

## 2018-09-10 NOTE — Progress Notes (Signed)
Telemedicine Encounter- SOAP NOTE Established Patient  This telephone encounter was conducted with the patient's (or proxy's) verbal consent via audio telecommunications: yes   Patient was instructed to have this encounter in a suitably private space; and to only have persons present to whom they give permission to participate. In addition, patient identity was confirmed by use of name plus two identifiers (DOB and address).  I discussed the limitations, risks, security and privacy concerns of performing an evaluation and management service by telephone and the availability of in person appointments. I also discussed with the patient that there may be a patient responsible charge related to this service. The patient expressed understanding and agreed to proceed.  I spent a total of 40 minutes talking with the patient or their proxy.  CC: Establish care, complicated grief, noncompliance with T2dm regimen  Subjective    [name] is a pleasant 33 y.o. female presenting today to establish care with myself as PCP and stated that she is interested in talking about her depression. Though she has a somewhat complicated medical history, we focused the visit to address 2 problems:  Type II Diabetes: She has been put on both Lantus and Humalog insulins, though she is admittedly noncompliant with this therapy. She still measures her blood sugars, which she states are in the 340 range. She is concerned that this is too high - and her concern combined with some nausea keep her from eating regularly. We discussed potential barriers to care and potential complications from short or long term uncontrolled T2DM.  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 Since this time, this patient has felt hopeless, lost, noticed strain on personal relationships, and has been anhedonic. She is interested today in  exploring two options: medication management with an antidepressive agent and counseling. In the past, she had been referred to counseling by [facility] PA-C, but the cost was prohibitive.     Patient Active Problem List   Diagnosis Date Noted  . Facial cellulitis 07/02/2018  . Class 3 severe obesity with body mass index (BMI) of 40.0 to 44.9 in adult ([place]) 07/09/2017  . Abscess of right breast 02/24/2016  . BMI 45.0-49.9, adult ([place]) 09/29/2015  . Skin lesions - right lower extremity 12/07/2013  . Morbid obesity with BMI of 50.0-59.9, adult ([place]) 10/05/2011  . Obstructive sleep apnea 10/05/2011  . Hypertriglyceridemia 09/27/2011  . Migraine 09/07/2011  . Essential hypertension, benign 11/01/2008  . Gastroparesis 09/27/2008  . IRREGULAR MENSES 08/24/2008  . Diabetes mellitus out of control ([place]) 07/04/2006    Past Medical History:  Diagnosis Date  . Diabetes mellitus   . E. coli sepsis ([place])   . Ectopic pregnancy   . Essential hypertension, benign 11/01/2008  . Fatty liver   . Gastroparesis   . GERD (gastroesophageal reflux disease)   . H. pylori infection   . Helicobacter pylori gastritis 09/07/2011  . Migraine   . Morbid obesity with BMI of 50.0-59.9, adult ([place]) 10/05/2011  . Pulmonary edema   . Pyelonephritis   . Ureteral obstruction     Current Outpatient Medications  Medication Sig Dispense Refill  . blood glucose meter kit and supplies Dispense based on patient and insurance preference. Use up to four times daily as directed. (FOR ICD-10 E10.9, E11.9). 1 each 0  . Insulin Glargine (LANTUS SOLOSTAR) 100 UNIT/ML Solostar Pen Inject 40 Units into the  skin daily. 15 mL 0  . insulin lispro (HUMALOG KWIKPEN) 100 UNIT/ML KwikPen Inject 0.08 mLs (8 Units total) into the skin 3 (three) times daily before meals. 15 mL 0  . Insulin Pen Needle 31G X 6 MM MISC 1 pen by Does not apply route as directed. 100 each 0   No current facility-administered medications for this visit.      Allergies  Allergen Reactions  . Bee Venom Anaphylaxis, Hives, Shortness Of Breath and Swelling  . Penicillins Anaphylaxis    Has patient had a PCN reaction causing immediate rash, facial/tongue/throat swelling, SOB or lightheadedness with hypotension: yes Has patient had a PCN reaction causing severe rash involving mucus membranes or skin necrosis: unknown Has patient had a PCN reaction that required hospitalization : yes Has patient had a PCN reaction occurring within the last 10 years: no If all of the above answers are "NO", then may proceed with Cephalosporin use.     Social History   Socioeconomic History  . Marital status: Divorced    Spouse name: Roderic Palau  . Number of children: 0  . Years of education: 65  . Highest education level: Not on file  Occupational History  . Occupation: Biomedical scientist: [employer]  . Financial resource strain: Somewhat hard  . Food insecurity:    Worry: Sometimes true    Inability: Sometimes true  . Transportation needs:    Medical: No    Non-medical: No  Tobacco Use  . Smoking status: Never Smoker  . Smokeless tobacco: Never Used  Substance and Sexual Activity  . Alcohol use: Not Currently  . Drug use: No  . Sexual activity: Yes    Partners: Male    Birth control/protection: Condom    Comment: Pt is interested in taking BCP  Lifestyle  . Physical activity:    Days per week: 0 days    Minutes per session: 0 min  . Stress: To some extent  Relationships  . Social connections:    Talks on phone: Three times a week    Gets together: Three times a week    Attends religious service: Never    Active member of club or organization: No    Attends meetings of clubs or organizations: Never    Relationship status: Divorced  . Intimate partner violence:    Fear of current or ex partner: No    Emotionally abused: No    Physically abused: No    Forced sexual activity: No  Other Topics Concern  . Not on  file  Social History Narrative   The patient is single, separated from her husband 01/2015.   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 [employer]   patient is left handed    Lives with her mother    ROS  Objective   Vitals as reported by the patient: Today's Vitals   09/10/18 1450  Weight: 264 lb (119.7 kg)  Height: 5' 2"  (1.575 m)    Diagnoses and all orders for this visit:  Encounter to establish care  Complicated grief  Type 2 diabetes mellitus with other specified complication, unspecified whether long term insulin use ([place])  PLAN:  Referral to Endo sent - pt is concerned about cost of seeing specialist, but I tried to emphasize the value that even a single consultation would have regarding her care.   38m Escitalopram PO qd for grief / depression. Pt has not been on this  medication before. We discussed the adverse effects and reasons to contact clinic. We discussed that this medication may take 3-4 weeks to show therapeutic effect, and compliance with therapy was key in success. 90 day supply given to [place] - $4 rx plan ideal for patient - she knows to follow up sooner with any questions, comments, or concerns.   As a [employer] employee, Ms. AMBRY DIX is entitled to the benefits of the EACP. She was given information on the EACP as a free option for counseling, as finances are a concern for her at this time. She states she intends to contact them to set up counseling appointments.  Labs: baseline labs ordered, as patient is new to my care and has a variety of potential medical concerns - will follow up as needed based on results.   Encourage patient to return to clinic with any questions, comments, or concerns.   I discussed the assessment and treatment plan with the patient. The patient was provided an opportunity to ask questions and all were answered. The patient agreed with the plan and demonstrated an understanding of the instructions.    The patient was advised to call back or seek an in-person evaluation if the symptoms worsen or if the condition fails to improve as anticipated.  I provided 40 minutes of non-face-to-face time during this encounter.  Maximiano Coss, NP  Primary Care at [facility]

## 2018-09-10 NOTE — Progress Notes (Signed)
TOC and Depression   GAD 7 screening done score 16 and PHQ9 screening done score 6     GAD 7 Questionnaire Over the last 2 weeks, how often have you been bothered by the following problems?   Not at all sure = 0   Several days = 1   Over half the days =2   Nearly every day =3   1.  Feeling nervous, anxious, or on edge - 3 2.  Not being able to stop or control worrying - 3 3.  Worrying too much about different things - 3 4.  Trouble relaxing - 2 5.  Being so restless that it's hard to sit still - 1 6.  Becoming easily annoyed or irritable - 3 7.  Feeling afraid as if something awful might happen - 1 Total Score (add your column scores) =         16  If you checked off any problems, how difficult have these made it for you to do your work, take care of things at home, or get along with other people?  Very difficult

## 2018-10-15 ENCOUNTER — Encounter: Payer: Self-pay | Admitting: Internal Medicine

## 2018-11-24 MED FILL — CLINDAMYCIN HCL 300 MG CAPS: 300 | 7 days supply | Qty: 14 | Fill #0

## 2018-12-29 ENCOUNTER — Encounter: Payer: Self-pay | Admitting: Surgery

## 2018-12-29 ENCOUNTER — Other Ambulatory Visit: Payer: Self-pay

## 2018-12-29 ENCOUNTER — Ambulatory Visit (INDEPENDENT_AMBULATORY_CARE_PROVIDER_SITE_OTHER): Payer: 59 | Admitting: Surgery

## 2018-12-29 VITALS — BP 146/99 | HR 110 | Temp 98.0°F | Resp 20 | Ht 62.0 in | Wt 260.0 lb

## 2018-12-29 DIAGNOSIS — E11621 Type 2 diabetes mellitus with foot ulcer: Secondary | ICD-10-CM

## 2018-12-29 DIAGNOSIS — L97519 Non-pressure chronic ulcer of other part of right foot with unspecified severity: Secondary | ICD-10-CM | POA: Diagnosis not present

## 2018-12-29 MED ORDER — SULFAMETHOXAZOLE-TRIMETHOPRIM 400-80 MG PO TABS: 1.0000 | ORAL_TABLET | Freq: Two times a day (BID) | ORAL | 0 refills | Status: DC

## 2018-12-29 MED FILL — SULFAMETHOXAZOLE-TMP SS TAB: 400-80 | 14 days supply | Qty: 28 | Fill #0

## 2018-12-29 NOTE — Progress Notes (Signed)
Vascular and Vein Specialist of [facility]  Patient name: Sheila Austin MRN: [id number] DOB: [birth year] Sex: female   REQUESTING PROVIDER:    self   REASON FOR CONSULT:    Right foot uulcer  HISTORY OF PRESENT ILLNESS:   Sheila Austin is a 33 y.o. female, who is here today for evaluation of a right foot ulcer.  This originally began as a blister approximately 2 months ago [place]  It has deteriorated.  She did have some antibiotics left over from a tooth infection that she took but has not had any antibiotic for 1.5 months.  This is progressed to where it has a foul odor, her leg is swollen, and there is redness going up her leg.  Her blood sugar was checked in the office and it was 400  PAST MEDICAL HISTORY    Past Medical History:  Diagnosis Date  . Diabetes mellitus   . E. coli sepsis ([place])   . Ectopic pregnancy   . Essential hypertension, benign 11/01/2008  . Fatty liver   . Gastroparesis   . GERD (gastroesophageal reflux disease)   . H. pylori infection   . Helicobacter pylori gastritis 09/07/2011  . Migraine   . Morbid obesity with BMI of 50.0-59.9, adult ([place]) 10/05/2011  . Pulmonary edema   . Pyelonephritis   . Ureteral obstruction      FAMILY HISTORY   Family History  Problem Relation Age of Onset  . Arthritis Mother   . Asthma Mother   . COPD Mother   . Depression Mother   . Diabetes Mother   . Hypertension Mother   . Mental illness Mother   . Kidney disease Mother   . Crohn's disease Mother   . Aneurysm Father 57       brain  . Mental illness Sister   . Arthritis Maternal Aunt   . Asthma Maternal Aunt   . COPD Maternal Aunt   . Depression Maternal Aunt   . Diabetes Maternal Aunt   . Hypertension Maternal Aunt   . Mental illness Maternal Aunt   . Stroke Maternal Aunt   . Heart failure Maternal Aunt   . Heart failure Maternal Grandfather   . Cancer Maternal  Grandfather   . Diabetes Maternal Grandfather   . Heart disease Maternal Grandfather   . Hypertension Maternal Grandfather   . Hyperlipidemia Maternal Grandfather   . Mental illness Brother   . Diabetes Maternal Grandmother   . Heart disease Maternal Grandmother   . Hypertension Maternal Grandmother   . Hyperlipidemia Maternal Grandmother   . Diabetes Paternal Grandmother   . Heart disease Paternal Grandmother   . Diabetes Paternal Grandfather   . Cancer Paternal Grandfather     SOCIAL HISTORY:   Social History   Socioeconomic History  . Marital status: Divorced    Spouse name: Roderic Palau  . Number of children: 0  . Years of education: 32  . Highest education level: Not on file  Occupational History  . Occupation: Biomedical scientist: [employer]  . Financial resource strain: Somewhat hard  . Food insecurity    Worry: Sometimes true    Inability: Sometimes true  . Transportation needs    Medical: No    Non-medical: No  Tobacco Use  . Smoking status: Never Smoker  . Smokeless tobacco: Never Used  Substance and Sexual Activity  . Alcohol use: Not Currently  . Drug  use: No  . Sexual activity: Yes    Partners: Male    Birth control/protection: Condom    Comment: Pt is interested in taking BCP  Lifestyle  . Physical activity    Days per week: 0 days    Minutes per session: 0 min  . Stress: To some extent  Relationships  . Social Herbalist on phone: Three times a week    Gets together: Three times a week    Attends religious service: Never    Active member of club or organization: No    Attends meetings of clubs or organizations: Never    Relationship status: Divorced  . Intimate partner violence    Fear of current or ex partner: No    Emotionally abused: No    Physically abused: No    Forced sexual activity: No  Other Topics Concern  . Not on file  Social History Narrative   The patient is single, separated from her  husband 01/2015.   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 Bee Venom Anaphylaxis, Hives, Shortness Of Breath and Swelling  . Penicillins Anaphylaxis    Has patient had a PCN reaction causing immediate rash, facial/tongue/throat swelling, SOB or lightheadedness with hypotension: yes Has patient had a PCN reaction causing severe rash involving mucus membranes or skin necrosis: unknown Has patient had a PCN reaction that required hospitalization : yes Has patient had a PCN reaction occurring within the last 10 years: no If all of the above answers are "NO", then may proceed with Cephalosporin use.     CURRENT MEDICATIONS:    Current Outpatient Medications  Medication Sig Dispense Refill  . blood glucose meter kit and supplies Dispense based on patient and insurance preference. Use up to four times daily as directed. (FOR ICD-10 E10.9, E11.9). (Patient not taking: Reported on 12/29/2018) 1 each 0  . escitalopram (LEXAPRO) 10 MG tablet Take 1 tablet (10 mg total) by mouth daily. (Patient not taking: Reported on 12/29/2018) 90 tablet 0  . Insulin Glargine (LANTUS SOLOSTAR) 100 UNIT/ML Solostar Pen Inject 40 Units into the skin daily. (Patient not taking: Reported on 12/29/2018) 15 mL 0  . insulin lispro (HUMALOG KWIKPEN) 100 UNIT/ML KwikPen Inject 0.08 mLs (8 Units total) into the skin 3 (three) times daily before meals. (Patient not taking: Reported on 12/29/2018) 15 mL 0  . Insulin Pen Needle 31G X 6 MM MISC 1 pen by Does not apply route as directed. (Patient not taking: Reported on 12/29/2018) 100 each 0   No current facility-administered medications for this visit.     REVIEW OF SYSTEMS:   [X]  denotes positive finding, [ ]  denotes negative finding Cardiac  Comments:  Chest pain or chest pressure:    Shortness of breath upon exertion:    Short of breath when lying  flat:    Irregular heart rhythm:        Vascular    Pain in calf, thigh, or hip brought on by ambulation:    Pain in feet at night that wakes you up from your sleep:     Blood clot in your veins:    Leg swelling:  x       Pulmonary    Oxygen at home:    Productive cough:     Wheezing:  Neurologic    Sudden weakness in arms or legs:     Sudden numbness in arms or legs:     Sudden onset of difficulty speaking or slurred speech:    Temporary loss of vision in one eye:     Problems with dizziness:         Gastrointestinal    Blood in stool:      Vomited blood:         Genitourinary    Burning when urinating:     Blood in urine:        Psychiatric    Major depression:         Hematologic    Bleeding problems:    Problems with blood clotting too easily:        Skin    Rashes or ulcers: x       Constitutional    Fever or chills:     PHYSICAL EXAM:   Vitals:   12/29/18 1059  BP: (!) 146/99  Pulse: (!) 110  Resp: 20  Temp: 98 F (36.7 C)  SpO2: 99%  Weight: 260 lb (117.9 kg)  Height: 5' 2"  (1.575 m)    GENERAL: The patient is a well-nourished female, in no acute distress. The vital signs are documented above. CARDIAC: There is a regular rate and rhythm.  VASCULAR: Triphasic Doppler signal in the right foot.  Edema and erythema on the dorsum of the foot PULMONARY: Nonlabored respirations ABDOMEN: Soft and non-tender with normal pitched bowel sounds.  MUSCULOSKELETAL: There are no major deformities or cyanosis. NEUROLOGIC: No focal weakness or paresthesias are detected. SKIN: Open draining ulcerated area on the lateral aspect of the right forefoot PSYCHIATRIC: The patient has a normal affect.  STUDIES:   None  ASSESSMENT and PLAN   Right diabetic foot ulcer: The patient has PAL time beginning on Thursday.  We discussed starting oral antibiotics today and trying to manage her blood sugar.  We will see how her foot looks on Wednesday at the end of  work but most likely she will need admission to the hospital for IV antibiotics as well as evaluation by the hospitalist service to get her blood glucose under control and get her a home regimen.  She does not really have a primary care physician who can address this currently.   Leia Alf, MD, FACS Vascular and Vein Specialists of [facility] [phone] Pager [phone]

## 2018-12-31 ENCOUNTER — Inpatient Hospital Stay (HOSPITAL_COMMUNITY)
Admission: AD | Admit: 2018-12-31 | Discharge: 2019-01-03 | DRG: 872 | Discharge status: Against Medical Advice | Payer: 59 | Source: Ambulatory Visit | Attending: Internal Medicine | Admitting: Internal Medicine

## 2018-12-31 ENCOUNTER — Ambulatory Visit (INDEPENDENT_AMBULATORY_CARE_PROVIDER_SITE_OTHER): Payer: 59 | Admitting: Vascular Surgery

## 2018-12-31 ENCOUNTER — Encounter: Payer: Self-pay | Admitting: Vascular Surgery

## 2018-12-31 ENCOUNTER — Other Ambulatory Visit: Payer: Self-pay

## 2018-12-31 VITALS — BP 127/82 | HR 99 | Temp 97.2°F | Resp 20 | Ht 62.0 in | Wt 259.4 lb

## 2018-12-31 DIAGNOSIS — L03119 Cellulitis of unspecified part of limb: Secondary | ICD-10-CM | POA: Diagnosis present

## 2018-12-31 DIAGNOSIS — Z880 Allergy status to penicillin: Secondary | ICD-10-CM

## 2018-12-31 DIAGNOSIS — G43909 Migraine, unspecified, not intractable, without status migrainosus: Secondary | ICD-10-CM | POA: Diagnosis not present

## 2018-12-31 DIAGNOSIS — Z20828 Contact with and (suspected) exposure to other viral communicable diseases: Secondary | ICD-10-CM | POA: Diagnosis present

## 2018-12-31 DIAGNOSIS — Z794 Long term (current) use of insulin: Secondary | ICD-10-CM | POA: Diagnosis not present

## 2018-12-31 DIAGNOSIS — E11621 Type 2 diabetes mellitus with foot ulcer: Secondary | ICD-10-CM | POA: Diagnosis not present

## 2018-12-31 DIAGNOSIS — A419 Sepsis, unspecified organism: Principal | ICD-10-CM | POA: Diagnosis present

## 2018-12-31 DIAGNOSIS — L03115 Cellulitis of right lower limb: Secondary | ICD-10-CM | POA: Diagnosis not present

## 2018-12-31 DIAGNOSIS — D638 Anemia in other chronic diseases classified elsewhere: Secondary | ICD-10-CM | POA: Diagnosis present

## 2018-12-31 DIAGNOSIS — E1165 Type 2 diabetes mellitus with hyperglycemia: Secondary | ICD-10-CM | POA: Diagnosis present

## 2018-12-31 DIAGNOSIS — IMO0002 Reserved for concepts with insufficient information to code with codable children: Secondary | ICD-10-CM | POA: Diagnosis present

## 2018-12-31 DIAGNOSIS — Z6841 Body Mass Index (BMI) 40.0 and over, adult: Secondary | ICD-10-CM | POA: Diagnosis not present

## 2018-12-31 DIAGNOSIS — L97519 Non-pressure chronic ulcer of other part of right foot with unspecified severity: Secondary | ICD-10-CM

## 2018-12-31 DIAGNOSIS — L089 Local infection of the skin and subcutaneous tissue, unspecified: Secondary | ICD-10-CM

## 2018-12-31 DIAGNOSIS — N179 Acute kidney failure, unspecified: Secondary | ICD-10-CM | POA: Diagnosis present

## 2018-12-31 DIAGNOSIS — Z79899 Other long term (current) drug therapy: Secondary | ICD-10-CM

## 2018-12-31 DIAGNOSIS — E669 Obesity, unspecified: Secondary | ICD-10-CM | POA: Insufficient documentation

## 2018-12-31 DIAGNOSIS — S91301A Unspecified open wound, right foot, initial encounter: Secondary | ICD-10-CM | POA: Diagnosis not present

## 2018-12-31 DIAGNOSIS — E11628 Type 2 diabetes mellitus with other skin complications: Secondary | ICD-10-CM | POA: Diagnosis not present

## 2018-12-31 DIAGNOSIS — I10 Essential (primary) hypertension: Secondary | ICD-10-CM | POA: Diagnosis not present

## 2018-12-31 LAB — GLUCOSE, CAPILLARY
Glucose-Capillary: 256 mg/dL — ABNORMAL HIGH (ref 70–99)
Glucose-Capillary: 246 mg/dL — ABNORMAL HIGH (ref 70–99)

## 2018-12-31 LAB — CBC
HCT: 25.7 % — ABNORMAL LOW (ref 36.0–46.0)
Hemoglobin: 8.6 g/dL — ABNORMAL LOW (ref 12.0–15.0)
MCH: 28.8 pg (ref 26.0–34.0)
MCHC: 33.5 g/dL (ref 30.0–36.0)
MCV: 86 fL (ref 80.0–100.0)
Platelets: 380 10*3/uL (ref 150–400)
RBC: 2.99 MIL/uL — ABNORMAL LOW (ref 3.87–5.11)
RDW: 11.8 % (ref 11.5–15.5)
WBC: 11.9 10*3/uL — ABNORMAL HIGH (ref 4.0–10.5)
nRBC: 0 % (ref 0.0–0.2)

## 2018-12-31 LAB — BASIC METABOLIC PANEL
Anion gap: 10 (ref 5–15)
BUN: 15 mg/dL (ref 6–20)
CO2: 25 mmol/L (ref 22–32)
Calcium: 8.6 mg/dL — ABNORMAL LOW (ref 8.9–10.3)
Chloride: 98 mmol/L (ref 98–111)
Creatinine, Ser: 1.5 mg/dL — ABNORMAL HIGH (ref 0.44–1.00)
GFR calc Af Amer: 54 mL/min — ABNORMAL LOW (ref >60)
GFR calc non Af Amer: 46 mL/min — ABNORMAL LOW (ref >60)
Glucose, Bld: 263 mg/dL — ABNORMAL HIGH (ref 70–99)
Potassium: 4.3 mmol/L (ref 3.5–5.1)
Sodium: 133 mmol/L — ABNORMAL LOW (ref 135–145)

## 2018-12-31 MED ORDER — IBUPROFEN 400 MG PO TABS
400.0000 mg | ORAL_TABLET | Freq: Four times a day (QID) | ORAL | Status: DC | PRN
  Administered 2018-12-31 – 2019-01-01 (×3): 400 mg via ORAL
  Filled 2018-12-31 (×3): qty 1

## 2018-12-31 MED ORDER — VANCOMYCIN HCL 10 G IV SOLR
1750.0000 mg | INTRAVENOUS | Status: DC
  Filled 2018-12-31: qty 1750

## 2018-12-31 MED ORDER — ENOXAPARIN SODIUM 40 MG/0.4ML ~~LOC~~ SOLN
40.0000 mg | SUBCUTANEOUS | Status: DC
  Administered 2018-12-31 – 2019-01-02 (×3): 40 mg via SUBCUTANEOUS
  Filled 2018-12-31 (×4): qty 0.4

## 2018-12-31 MED ORDER — ACETAMINOPHEN 325 MG PO TABS
650.0000 mg | ORAL_TABLET | Freq: Four times a day (QID) | ORAL | Status: DC | PRN
  Filled 2018-12-31: qty 2

## 2018-12-31 MED ORDER — SODIUM CHLORIDE 0.9 % IV SOLN
2.0000 g | Freq: Once | INTRAVENOUS | Status: AC
  Administered 2018-12-31: 21:00:00 2 g via INTRAVENOUS
  Filled 2018-12-31: qty 2

## 2018-12-31 MED ORDER — ONDANSETRON HCL 4 MG PO TABS: 4.0000 mg | ORAL_TABLET | Freq: Four times a day (QID) | ORAL | Status: DC | PRN

## 2018-12-31 MED ORDER — INSULIN ASPART 100 UNIT/ML ~~LOC~~ SOLN
0.0000 [IU] | Freq: Three times a day (TID) | SUBCUTANEOUS | Status: DC
  Administered 2019-01-01: 14:00:00 5 [IU] via SUBCUTANEOUS
  Administered 2019-01-01: 18:00:00 3 [IU] via SUBCUTANEOUS
  Administered 2019-01-01 – 2019-01-02 (×2): 5 [IU] via SUBCUTANEOUS
  Administered 2019-01-02: 09:00:00 8 [IU] via SUBCUTANEOUS
  Administered 2019-01-02 – 2019-01-03 (×3): 3 [IU] via SUBCUTANEOUS

## 2018-12-31 MED ORDER — ONDANSETRON HCL 4 MG/2ML IJ SOLN: 4.0000 mg | Freq: Four times a day (QID) | INTRAMUSCULAR | Status: DC | PRN

## 2018-12-31 MED ORDER — SODIUM CHLORIDE 0.9 % IV SOLN
INTRAVENOUS | Status: DC
  Administered 2018-12-31 – 2019-01-03 (×2): via INTRAVENOUS

## 2018-12-31 MED ORDER — SODIUM CHLORIDE 0.9 % IV SOLN
2.0000 g | Freq: Three times a day (TID) | INTRAVENOUS | Status: DC
  Administered 2019-01-01: 03:00:00 2 g via INTRAVENOUS
  Filled 2018-12-31 (×3): qty 2

## 2018-12-31 MED ORDER — VANCOMYCIN HCL 10 G IV SOLR
2000.0000 mg | Freq: Once | INTRAVENOUS | Status: AC
  Administered 2018-12-31: 21:00:00 2000 mg via INTRAVENOUS
  Filled 2018-12-31: qty 2000

## 2018-12-31 MED ORDER — MORPHINE SULFATE (PF) 2 MG/ML IV SOLN
1.0000 mg | INTRAVENOUS | Status: DC | PRN
  Filled 2018-12-31 (×2): qty 1

## 2018-12-31 MED ORDER — PANTOPRAZOLE SODIUM 40 MG PO TBEC
40.0000 mg | DELAYED_RELEASE_TABLET | Freq: Every day | ORAL | Status: DC
  Administered 2018-12-31 – 2019-01-03 (×4): 40 mg via ORAL
  Filled 2018-12-31 (×4): qty 1

## 2018-12-31 MED ORDER — ACETAMINOPHEN 650 MG RE SUPP: 650.0000 mg | Freq: Four times a day (QID) | RECTAL | Status: DC | PRN

## 2018-12-31 MED ORDER — SODIUM CHLORIDE 0.9% FLUSH
10.0000 mL | INTRAVENOUS | Status: DC | PRN
  Administered 2019-01-02: 05:00:00 10 mL
  Filled 2018-12-31: qty 40

## 2018-12-31 NOTE — Progress Notes (Signed)
Pharmacy Antibiotic Note  Sheila Austin is a 33 y.o. female admitted on 12/31/2018 with right foot edema and erythema (diabetic foot infection).  Pt had an injury to right foot, lateral aspect, ~4 weeks ago and developed a small blister that was persistent despite wound care. Over the past wk, the blister evolved into a deep ulcer associated with right foot edema, erythema and tenderness. Pt was seen in outpt surgical clinic on 8/24 and was prescribed oral Bactrim. Today, pt's symptoms are worsening. Pharmacy has been consulted for vancomycin and aztreonam dosing.  Medical history includes: type 2 DM (uncontrolled), morbid obesity, HTN  WBC 11.9, afebrile, Scr 1.5, CrCl 66.7 ml/min (Scr up from 0.62-0.83 in 1st quarter of 2020)   Plan: Aztreonam 2 gm IV Q 8 hrs Vancomycin 2 gm IV X 1, followed by vancomycin 1750 mg IV Q 24 hrs (estimated vancomycin AUC with this regimen is 511; goal vancomycin AUC is 400-550) Monitor renal function, WBC, temp, clinical improvement   Temp (24hrs), Avg:98.9 F (37.2 C), Min:97.2 F (36.2 C), Max:100 F (37.8 C)  CrCl cannot be calculated (Patient's most recent lab result is older than the maximum 21 days allowed.).    Allergies  Allergen Reactions  . Bee Venom Anaphylaxis, Hives, Shortness Of Breath and Swelling  . Penicillins Anaphylaxis    Has patient had a PCN reaction causing immediate rash, facial/tongue/throat swelling, SOB or lightheadedness with hypotension: yes Has patient had a PCN reaction causing severe rash involving mucus membranes or skin necrosis: unknown Has patient had a PCN reaction that required hospitalization : yes Has patient had a PCN reaction occurring within the last 10 years: no If all of the above answers are "NO", then may proceed with Cephalosporin use.     Microbiology results: 8/26 COVID:  pending  Thank you for allowing pharmacy to be a part of this patient's care.  Gillermina Hu, PharmD, BCPS, [facility] Clinical  Pharmacist 12/31/2018 6:44 PM

## 2018-12-31 NOTE — Progress Notes (Signed)
Patient name: Sheila Austin MRN: [id number] DOB: 1985-09-15 Sex: female  REASON FOR VISIT:   Follow-up of diabetic ulceration right foot.   HPI:   Sheila Austin is a pleasant 33 y.o. female who was seen by Dr. Annamarie Major 2 days ago with a diabetic foot infection on the right.  She had developed a blister approximately 2 months ago while wearing flip-flops.  This wound gradually progressed.  She developed a foul-smelling wound on her left foot and in the office her blood sugar was 400.  She had triphasic Doppler signals in the right foot.  There was however edema and erythema on the dorsum of the foot.  She was started on Bactrim.  It was felt that she might need admission to the hospital for intravenous antibiotics and evaluation by the hospital service to get her blood sugar under control.  She does not have a primary care physician.  She comes in for a wound check.  She states that the redness has improved slightly since she has been on Bactrim.  She had no fever today.  Last week she had a fever to 101.  She has diabetes, hypertension, and elevated triglycerides.  She is not a smoker.  Current Outpatient Medications  Medication Sig Dispense Refill  . sulfamethoxazole-trimethoprim (BACTRIM) 400-80 MG tablet Take 1 tablet by mouth 2 (two) times daily. 28 tablet 0  . blood glucose meter kit and supplies Dispense based on patient and insurance preference. Use up to four times daily as directed. (FOR ICD-10 E10.9, E11.9). (Patient not taking: Reported on 12/29/2018) 1 each 0  . escitalopram (LEXAPRO) 10 MG tablet Take 1 tablet (10 mg total) by mouth daily. (Patient not taking: Reported on 12/29/2018) 90 tablet 0  . Insulin Glargine (LANTUS SOLOSTAR) 100 UNIT/ML Solostar Pen Inject 40 Units into the skin daily. (Patient not taking: Reported on 12/29/2018) 15 mL 0  . insulin lispro (HUMALOG KWIKPEN) 100 UNIT/ML KwikPen Inject 0.08 mLs (8 Units total) into the skin 3 (three) times daily before  meals. (Patient not taking: Reported on 12/29/2018) 15 mL 0  . Insulin Pen Needle 31G X 6 MM MISC 1 pen by Does not apply route as directed. (Patient not taking: Reported on 12/29/2018) 100 each 0   No current facility-administered medications for this visit.     REVIEW OF SYSTEMS:  [X]  denotes positive finding, [ ]  denotes negative finding Vascular    Leg swelling    Cardiac    Chest pain or chest pressure:    Shortness of breath upon exertion:    Short of breath when lying flat:    Irregular heart rhythm:    Constitutional    Fever or chills:     PHYSICAL EXAM:   Vitals:   12/31/18 0852  BP: 127/82  Pulse: 99  Resp: 20  Temp: (!) 97.2 F (36.2 C)  SpO2: 100%  Weight: 259 lb 6.4 oz (117.7 kg)  Height: 5' 2"  (1.575 m)    GENERAL: The patient is a well-nourished female, in no acute distress. The vital signs are documented above. CARDIOVASCULAR: There is a regular rate and rhythm. PULMONARY: There is good air exchange bilaterally without wheezing or rales. VASCULAR: I do not detect carotid bruits. On the right side, which is the side of concern, she has a fairly brisk posterior tibial signal with the Doppler.  She has a triphasic dorsalis pedis, anterior tibial, and peroneal signal with the Doppler. On the left side, she has a  biphasic dorsalis pedis and posterior tibial signal. EXTREMITIES: She has significant swelling of the right foot with cellulitis.  I probed the wound with a Q-tip but was not able to feel any bone.     DATA:   No new data.  MEDICAL ISSUES:   DIABETIC FOOT INFECTION RIGHT FOOT: This patient has an extensive infection of the right foot with cellulitis.  I think that she needs to be admitted to the hospital by the hospitalist for aggressive treatment of her diabetic foot infection.  She will need intravenous antibiotics, careful control of her blood sugars and management of her hypertension and triglycerides.  Fortunately she does not have any evidence  of significant peripheral vascular disease but this does not appear to be an issue.  I think she also needs an x-ray of her foot to rule out osteomyelitis.  We will follow her when she is in the hospital.  I will notify Dr. Trula Slade of her admission.  [name] Vascular and Vein Specialists of [facility] [phone]

## 2018-12-31 NOTE — Progress Notes (Signed)
MEWS Guidelines - (patients age 31 and over)  Red - At High Risk for Deterioration Yellow - At risk for Deterioration  1. Go to room and assess patient 2. Validate data. Is this patient's baseline? If data confirmed: 3. Is this an acute change? 4. Administer prn meds/treatments as ordered. 5. Note Sepsis score 6. Review goals of care 7. Sports coach, RRT nurse and Provider. 8. Ask Provider to come to bedside.  9. Document patient condition/interventions/response. 10. Increase frequency of vital signs and focused assessments to at least q15 minutes x 4, then q30 minutes x2. - If stable, then q1h x3, then q4h x3 and then q8h or dept. routine. - If unstable, contact Provider & RRT nurse. Prepare for possible transfer. 11. Add entry in progress notes using the smart phrase ".MEWS". 1. Go to room and assess patient 2. Validate data. Is this patient's baseline? If data confirmed: 3. Is this an acute change? 4. Administer prn meds/treatments as ordered? 5. Note Sepsis score 6. Review goals of care 7. Sports coach and Provider 8. Call RRT nurse as needed. 9. Document patient condition/interventions/response. 10. Increase frequency of vital signs and focused assessments to at least q2h x2. - If stable, then q4h x2 and then q8h or dept. routine. - If unstable, contact Provider & RRT nurse. Prepare for possible transfer. 11. Add entry in progress notes using the smart phrase ".MEWS".  Green - Likely stable Lavender - Comfort Care Only  1. Continue routine/ordered monitoring.  2. Review goals of care. 1. Continue routine/ordered monitoring. 2. Review goals of care.   MD paged and notified of yellow mews score. Patients vital sign frequency increased per protocol. Patient alert and oriented, in no acute distress. Will continue to monitor closely for remainder of shift.

## 2018-12-31 NOTE — H&P (Signed)
History and Physical    Sheila Austin XTA:[id number] DOB: August 12, 1985 DOA: 12/31/2018  PCP: Patient, No Pcp Per   Patient coming from: Dr. Scot Dock office  Chief Complaint: Right foot edema and erythema.   HPI: Sheila Austin is a 33 y.o. female with medical history significant of type 2 diabetes mellitus and obesity.  Patient had an injury to her right foot lateral aspect about 4 weeks ago while walking with flip-flops outdoors.  She developed a small blister (lateral aspect) that was persistent despite local wound care.  Over last 7 days this blister evolved into a deep ulcer which was associated with right foot edema, erythema and tenderness.  There is no improving or worsening factors.  3 days ago she was febrile at home.  She was seen by Dr. Trula Slade August 24, the outpatient surgical clinic.  He was prescribed oral antibiotic with Bactrim and scheduled for follow-up in 48 hours.  Follow-up outpatient visit today patient worsening symptoms with local edema erythema and worsening ulcer.  Her blood glucose was 400.  She was considered failed outpatient therapy she was referred for hospital for further treatment.   Review of Systems:  1. General: Positive fevers, but no chills, no weight gain or weight loss 2. ENT: No runny nose or sore throat, no hearing disturbances 3. Pulmonary: No dyspnea, cough, wheezing, or hemoptysis 4. Cardiovascular: No angina, claudication, lower extremity edema, pnd or orthopnea 5. Gastrointestinal: No nausea or vomiting, no diarrhea or constipation 6. Hematology: No easy bruisability or frequent infections 7. Urology: No dysuria, hematuria or increased urinary frequency 8. Dermatology: right foot lateral ulcer as mentioned in the HPI. . 9. Neurology: No seizures or paresthesias 10. Musculoskeletal: No joint pain or deformities  Past Medical History:  Diagnosis Date  . Diabetes mellitus   . E. coli sepsis ([place])   . Ectopic pregnancy   . Essential  hypertension, benign 11/01/2008  . Fatty liver   . Gastroparesis   . GERD (gastroesophageal reflux disease)   . H. pylori infection   . Helicobacter pylori gastritis 09/07/2011  . Migraine   . Morbid obesity with BMI of 50.0-59.9, adult ([place]) 10/05/2011  . Pulmonary edema   . Pyelonephritis   . Ureteral obstruction     Past Surgical History:  Procedure Laterality Date  . ESOPHAGOGASTRODUODENOSCOPY  2010     reports that she has never smoked. She has never used smokeless tobacco. She reports previous alcohol use. She reports that she does not use drugs.  Allergies  Allergen Reactions  . Bee Venom Anaphylaxis, Hives, Shortness Of Breath and Swelling  . Penicillins Anaphylaxis    Has patient had a PCN reaction causing immediate rash, facial/tongue/throat swelling, SOB or lightheadedness with hypotension: yes Has patient had a PCN reaction causing severe rash involving mucus membranes or skin necrosis: unknown Has patient had a PCN reaction that required hospitalization : yes Has patient had a PCN reaction occurring within the last 10 years: no If all of the above answers are "NO", then may proceed with Cephalosporin use.     Family History  Problem Relation Age of Onset  . Arthritis Mother   . Asthma Mother   . COPD Mother   . Depression Mother   . Diabetes Mother   . Hypertension Mother   . Mental illness Mother   . Kidney disease Mother   . Crohn's disease Mother   . Aneurysm Father 35       brain  . Mental illness Sister   .  Arthritis Maternal Aunt   . Asthma Maternal Aunt   . COPD Maternal Aunt   . Depression Maternal Aunt   . Diabetes Maternal Aunt   . Hypertension Maternal Aunt   . Mental illness Maternal Aunt   . Stroke Maternal Aunt   . Heart failure Maternal Aunt   . Heart failure Maternal Grandfather   . Cancer Maternal Grandfather   . Diabetes Maternal Grandfather   . Heart disease Maternal Grandfather   . Hypertension Maternal Grandfather   .  Hyperlipidemia Maternal Grandfather   . Mental illness Brother   . Diabetes Maternal Grandmother   . Heart disease Maternal Grandmother   . Hypertension Maternal Grandmother   . Hyperlipidemia Maternal Grandmother   . Diabetes Paternal Grandmother   . Heart disease Paternal Grandmother   . Diabetes Paternal Grandfather   . Cancer Paternal Grandfather      Prior to Admission medications   Medication Sig Start Date End Date Taking? Authorizing Provider  blood glucose meter kit and supplies Dispense based on patient and insurance preference. Use up to four times daily as directed. (FOR ICD-10 E10.9, E11.9). Patient not taking: Reported on 12/29/2018 07/08/18   Irwin Brakeman L, MD  escitalopram (LEXAPRO) 10 MG tablet Take 1 tablet (10 mg total) by mouth daily. Patient not taking: Reported on 12/29/2018 09/10/18   Maximiano Coss, NP  Insulin Glargine (LANTUS SOLOSTAR) 100 UNIT/ML Solostar Pen Inject 40 Units into the skin daily. Patient not taking: Reported on 12/29/2018 07/08/18   Irwin Brakeman L, MD  insulin lispro (HUMALOG KWIKPEN) 100 UNIT/ML KwikPen Inject 0.08 mLs (8 Units total) into the skin 3 (three) times daily before meals. Patient not taking: Reported on 12/29/2018 07/08/18   Murlean Iba, MD  Insulin Pen Needle 31G X 6 MM MISC 1 pen by Does not apply route as directed. Patient not taking: Reported on 12/29/2018 07/08/18   Murlean Iba, MD  sulfamethoxazole-trimethoprim (BACTRIM) 400-80 MG tablet Take 1 tablet by mouth 2 (two) times daily. 12/29/18   Serafina Mitchell, MD    Physical Exam: Vitals:   12/31/18 1733  BP: 135/85  Pulse: (!) 105  Resp: 12  Temp: 100 F (37.8 C)  TempSrc: Oral  SpO2: 99%    Vitals:   12/31/18 1733  BP: 135/85  Pulse: (!) 105  Resp: 12  Temp: 100 F (37.8 C)  TempSrc: Oral  SpO2: 99%   General: deconditioned  Neurology: Awake and alert, non focal Head and Neck. Head normocephalic. Neck supple with no adenopathy or  thyromegaly.   E ENT: mild pallor, no icterus, oral mucosa moist Cardiovascular: No JVD. S1-S2 present, rhythmic, no gallops, rubs, or murmurs. No lower extremity edema. Pulmonary: positive breath sounds bilaterally, adequate air movement, no wheezing, rhonchi or rales. Gastrointestinal. Abdomen with no organomegaly, non tender, no rebound or guarding Skin. Right foot, lateral aspect with linear ulcerated lesion, with foot erythema and edema. Tender to palpation and increased local temperature.  Musculoskeletal: no joint deformities        Labs on Admission: I have personally reviewed following labs and imaging studies  CBC: No results for input(s): WBC, NEUTROABS, HGB, HCT, MCV, PLT in the last 168 hours. Basic Metabolic Panel: No results for input(s): NA, K, CL, CO2, GLUCOSE, BUN, CREATININE, CALCIUM, MG, PHOS in the last 168 hours. GFR: CrCl cannot be calculated (Patient's most recent lab result is older than the maximum 21 days allowed.). Liver Function Tests: No results for input(s): AST, ALT, ALKPHOS,  BILITOT, PROT, ALBUMIN in the last 168 hours. No results for input(s): LIPASE, AMYLASE in the last 168 hours. No results for input(s): AMMONIA in the last 168 hours. Coagulation Profile: No results for input(s): INR, PROTIME in the last 168 hours. Cardiac Enzymes: No results for input(s): CKTOTAL, CKMB, CKMBINDEX, TROPONINI in the last 168 hours. BNP (last 3 results) No results for input(s): PROBNP in the last 8760 hours. HbA1C: No results for input(s): HGBA1C in the last 72 hours. CBG: No results for input(s): GLUCAP in the last 168 hours. Lipid Profile: No results for input(s): CHOL, HDL, LDLCALC, TRIG, CHOLHDL, LDLDIRECT in the last 72 hours. Thyroid Function Tests: No results for input(s): TSH, T4TOTAL, FREET4, T3FREE, THYROIDAB in the last 72 hours. Anemia Panel: No results for input(s): VITAMINB12, FOLATE, FERRITIN, TIBC, IRON, RETICCTPCT in the last 72 hours. Urine  analysis:    Component Value Date/Time   COLORURINE YELLOW 06/16/2017 1515   APPEARANCEUR HAZY (A) 06/16/2017 1515   LABSPEC 1.030 06/16/2017 1515   PHURINE 5.0 06/16/2017 1515   GLUCOSEU >=500 (A) 06/16/2017 1515   HGBUR MODERATE (A) 06/16/2017 1515   BILIRUBINUR negative 08/13/2017 1622   KETONESUR negative 08/13/2017 1622   KETONESUR 20 (A) 06/16/2017 1515   PROTEINUR =100 (A) 08/13/2017 1622   PROTEINUR 100 (A) 06/16/2017 1515   UROBILINOGEN 0.2 08/13/2017 1622   UROBILINOGEN 0.2 11/25/2014 0553   NITRITE Negative 08/13/2017 1622   NITRITE NEGATIVE 06/16/2017 1515   LEUKOCYTESUR Small (1+) (A) 08/13/2017 1622    Radiological Exams on Admission: No results found.  EKG: Independently reviewed. NA  Assessment/Plan Principal Problem:   Diabetic foot infection ([place]) Active Problems:   Diabetes mellitus out of control ([employer])   Essential hypertension, benign   Morbid obesity with BMI of 50.0-59.9, adult ([place])  33 year old female with obesity, hypertension and type 2 diabetes mellitus who presents with a right foot wound that has failed outpatient therapy with oral antibiotic therapy (Bactrim).  Injury sustained while walking outdoors using flip-flops.  Over the last 7 days lesion rapidly evolved into a deep ulcerated lesion associated with erythema and edema. On her initial physical examination temperature 37.8, blood pressure 135/85, heart rate 105, respiratory rate 12, oxygen saturation 99% on room air.  Moist mucous membranes, lungs clear to auscultation bilaterally, heart S1-S2 present and rhythmic, abdomen soft, right foot with local edema, erythema and lateral ulcerated lesion.  Patient has been admitted to the hospital with the working diagnosis of right foot infection, rule out osteomyelitis, complicated by hyperglycemia  1.  Right foot cellulitis, to rule out osteomyelitis.  Patient admitted to the medical ward, continue IV antibiotic therapy with vancomycin and aztreonam.   Patient allergic to penicillins.  Further work-up with foot MRI.  Pain control with as needed analgesics (ibuprofen and morphine).  Follow-up with vascular surgery recommendations. Add GI prophylaxis.   2.  Uncontrolled type 2 diabetes mellitus.  Patient has been off any diabetic therapy for months.  Denies any symptoms like polyuria, polydipsia or weight loss.  Will start patient on insulin sliding scale and will calculate insulin requirements.  Certainly tight glucose control will benefit her wound healing.  3.  Hypertension.  Currently patient not taking any antihypertensive agents.  4.  Obesity.  Will need outpatient follow-up.  Check COVID 19 screen on admission.   DVT prophylaxis:  Enoxaparin  Code Status:  full  Family Communication: no family at the bedside   Disposition Plan: med-surg  Consults called: vascular surgery   Admission  status: Inpatient    Imane Burrough Gerome Apley MD [facility]   12/31/2018, 5:49 PM

## 2018-12-31 NOTE — Progress Notes (Signed)
Patient admitted to unit as direct admit. Patient has right foot wound with boot and dressing in place. Patient settled in bed and oriented to unit and hospital routine. Pt resting comfortably in bed with call bell in reach and side rails up. Instructed patient to utilize call bell for assistance. Will continue to monitor closely for remainder of shift.

## 2019-01-01 ENCOUNTER — Encounter (HOSPITAL_COMMUNITY): Payer: Self-pay

## 2019-01-01 ENCOUNTER — Inpatient Hospital Stay (HOSPITAL_COMMUNITY): Payer: 59

## 2019-01-01 LAB — BASIC METABOLIC PANEL
Anion gap: 10 (ref 5–15)
BUN: 19 mg/dL (ref 6–20)
CO2: 23 mmol/L (ref 22–32)
Calcium: 8.2 mg/dL — ABNORMAL LOW (ref 8.9–10.3)
Chloride: 100 mmol/L (ref 98–111)
Creatinine, Ser: 1.92 mg/dL — ABNORMAL HIGH (ref 0.44–1.00)
GFR calc Af Amer: 40 mL/min — ABNORMAL LOW (ref >60)
GFR calc non Af Amer: 34 mL/min — ABNORMAL LOW (ref >60)
Glucose, Bld: 233 mg/dL — ABNORMAL HIGH (ref 70–99)
Potassium: 3.7 mmol/L (ref 3.5–5.1)
Sodium: 133 mmol/L — ABNORMAL LOW (ref 135–145)

## 2019-01-01 LAB — CBC
HCT: 23.2 % — ABNORMAL LOW (ref 36.0–46.0)
Hemoglobin: 7.6 g/dL — ABNORMAL LOW (ref 12.0–15.0)
MCH: 29.1 pg (ref 26.0–34.0)
MCHC: 32.8 g/dL (ref 30.0–36.0)
MCV: 88.9 fL (ref 80.0–100.0)
Platelets: 296 10*3/uL (ref 150–400)
RBC: 2.61 MIL/uL — ABNORMAL LOW (ref 3.87–5.11)
RDW: 11.9 % (ref 11.5–15.5)
WBC: 10 10*3/uL (ref 4.0–10.5)
nRBC: 0 % (ref 0.0–0.2)

## 2019-01-01 LAB — GLUCOSE, CAPILLARY
Glucose-Capillary: 237 mg/dL — ABNORMAL HIGH (ref 70–99)
Glucose-Capillary: 215 mg/dL — ABNORMAL HIGH (ref 70–99)
Glucose-Capillary: 156 mg/dL — ABNORMAL HIGH (ref 70–99)
Glucose-Capillary: 165 mg/dL — ABNORMAL HIGH (ref 70–99)

## 2019-01-01 LAB — SARS CORONAVIRUS 2 (TAT 6-24 HRS): SARS Coronavirus 2: NEGATIVE

## 2019-01-01 LAB — HIV ANTIBODY (ROUTINE TESTING W REFLEX): HIV Screen 4th Generation wRfx: NONREACTIVE

## 2019-01-01 MED ORDER — VANCOMYCIN HCL IN DEXTROSE 1-5 GM/200ML-% IV SOLN
1000.0000 mg | INTRAVENOUS | Status: DC
  Administered 2019-01-01 – 2019-01-02 (×2): 1000 mg via INTRAVENOUS
  Filled 2019-01-01 (×3): qty 200

## 2019-01-01 MED ORDER — SODIUM CHLORIDE 0.9 % IV SOLN
1.0000 g | Freq: Three times a day (TID) | INTRAVENOUS | Status: DC
  Administered 2019-01-01 – 2019-01-03 (×7): 1 g via INTRAVENOUS
  Filled 2019-01-01 (×10): qty 1

## 2019-01-01 NOTE — Progress Notes (Signed)
Spoke with patient on the phone. Patient asked me to call her back in the morning. She was having a rough time at the present, crying, and requested that we talk later.   Harvel Ricks RN BSN CDE Diabetes Coordinator Pager: [phone]  8am-5pm

## 2019-01-01 NOTE — Progress Notes (Signed)
PROGRESS NOTE    Sheila Austin  [id number] DOB: 05/17/1985 DOA: 12/31/2018 PCP: Patient, No Pcp Per   Brief Narrative:  Sheila Austin is a 33 y.o. female with medical history significant of type 2 diabetes mellitus and obesity.  Patient had an injury to her right foot lateral aspect about 4 weeks ago while walking with flip-flops outdoors.  She developed a small blister (lateral aspect) that was persistent despite local wound care.  Over last 7 days this blister evolved into a deep ulcer which was associated with right foot edema, erythema and tenderness.  There is no improving or worsening factors.  3 days ago she was febrile at home. She was seen by Dr. Trula Slade August 24, the outpatient surgical clinic.  He was prescribed oral antibiotic with Bactrim and scheduled for follow-up in 48 hours.  Follow-up outpatient visit today patient worsening symptoms with local edema erythema and worsening ulcer.  Her blood glucose was 400.  She was considered failed outpatient therapy she was referred for hospital for further treatment  Assessment & Plan:   Principal Problem:   Diabetic foot infection ([place]) Active Problems:   Diabetes mellitus out of control ([place])   Essential hypertension, benign   Morbid obesity with BMI of 50.0-59.9, adult ([place])    Sepsis 2/2 right foot cellulitis, osteomyelitis ruled out   Continue IV vancomycin and aztreonam.  Anaphylaxis to penicillins. Patient tachycardic, leukocytosis at admission meeting sepsis criteria, now improved with IV fluids and antibiotics  MRI as below negative for acute osteomyelitis notable ulceration with notable gas discerning for advanced infection Pain control with as needed analgesics (ibuprofen and morphine).  Vascular following, no previous evidence of peripheral vascular disease  Uncontrolled type 2 diabetes mellitus.  Continue insulin sliding scale and will calculate insulin requirements.   Certainly tight glucose control will  benefit her wound healing. Appears to be noncompliant with home regimen or diet  Hypertension  Currently patient not taking any antihypertensive agents.  Obesity Will need outpatient follow-up.   DVT prophylaxis:  Enoxaparin  Code Status:  full  Family Communication: no family at the bedside   Disposition Plan: med-surg  Consults called: vascular surgery   Admission status: Inpatient   Subjective: No acute issues or events overnight, pain currently well controlled, swelling appears to be improving per the patient.  Otherwise declines chest pain, shortness of breath, nausea, vomiting, diarrhea, constipation, headache, fevers, chills.  Objective: Vitals:   12/31/18 2200 12/31/18 2342 01/01/19 0429 01/01/19 0759  BP: 102/75 91/64 (!) 108/55 131/84  Pulse: 99 84 82 82  Resp: 16 16 16 18   Temp: 99.3 F (37.4 C) 98.1 F (36.7 C) 97.6 F (36.4 C) 98.2 F (36.8 C)  TempSrc: Oral Oral Oral Oral  SpO2: 100% 100% 99% 97%  Weight:      Height:        Intake/Output Summary (Last 24 hours) at 01/01/2019 1440 Last data filed at 01/01/2019 1015 Gross per 24 hour  Intake 635.68 ml  Output --  Net 635.68 ml   Filed Weights   12/31/18 1920  Weight: 117.7 kg    Examination:  General:  Pleasantly resting in bed, No acute distress. HEENT:  Normocephalic atraumatic.  Sclerae nonicteric, noninjected.  Extraocular movements intact bilaterally. Neck:  Without mass or deformity.  Trachea is midline. Lungs:  Clear to auscultate bilaterally without rhonchi, wheeze, or rales. Heart:  Regular rate and rhythm.  Without murmurs, rubs, or gallops. Abdomen:  Soft, nontender, nondistended.  Without  guarding or rebound. Extremities: Right foot markedly swollen, tender to palpation laterally over fifth metatarsal, notably open ulceration, see imaging as below from vascular office.  Otherwise without cyanosis, clubbing, edema, or obvious deformity. Vascular:  Dorsalis pedis and posterior tibial  pulses palpable bilaterally. Skin: Right foot as above, otherwise warm and dry, no erythema, no ulcerations.      Data Reviewed: I have personally reviewed following labs and imaging studies  CBC: Recent Labs  Lab 12/31/18 2020 01/01/19 0359  WBC 11.9* 10.0  HGB 8.6* 7.6*  HCT 25.7* 23.2*  MCV 86.0 88.9  PLT 380 0000000   Basic Metabolic Panel: Recent Labs  Lab 12/31/18 1920 01/01/19 0359  NA 133* 133*  K 4.3 3.7  CL 98 100  CO2 25 23  GLUCOSE 263* 233*  BUN 15 19  CREATININE 1.50* 1.92*  CALCIUM 8.6* 8.2*   CBG: Recent Labs  Lab 12/31/18 1839 12/31/18 2021 01/01/19 0752 01/01/19 1239  GLUCAP 256* 246* 237* 215*    Recent Results (from the past 240 hour(s))  SARS CORONAVIRUS 2 (TAT 6-12 HRS) Nasal Swab Aptima Multi Swab     Status: None   Collection Time: 12/31/18  6:23 PM   Specimen: Aptima Multi Swab; Nasal Swab  Result Value Ref Range Status   SARS Coronavirus 2 NEGATIVE NEGATIVE Final    Comment: (NOTE) SARS-CoV-2 target nucleic acids are NOT DETECTED. The SARS-CoV-2 RNA is generally detectable in upper and lower respiratory specimens during the acute phase of infection. Negative results do not preclude SARS-CoV-2 infection, do not rule out co-infections with other pathogens, and should not be used as the sole basis for treatment or other patient management decisions. Negative results must be combined with clinical observations, patient history, and epidemiological information. The expected result is Negative. Fact Sheet for Patients: SugarRoll.be Fact Sheet for Healthcare Providers: https://www.woods-mathews.com/ This test is not yet approved or cleared by the [place] FDA and  has been authorized for detection and/or diagnosis of SARS-CoV-2 by FDA under an Emergency Use Authorization (EUA). This EUA will remain  in effect (meaning this test can be used) for the duration of the COVID-19 declaration under  Section 56 4(b)(1) of the Act, 21 U.S.C. section 360bbb-3(b)(1), unless the authorization is terminated or revoked sooner. Performed at [facility], [place] [address] [place], [place] [address]          Radiology Studies: Mr Foot Right Wo Contrast  Result Date: 01/01/2019 CLINICAL DATA:  Cellulitis, right foot pain EXAM: MRI OF THE RIGHT FOREFOOT WITHOUT CONTRAST TECHNIQUE: Multiplanar, multisequence MR imaging of the ankle was performed. No intravenous contrast was administered. COMPARISON:  Right ankle x-ray 02/08/2008 FINDINGS: Bones/Joint/Cartilage No acute fracture. No cortical destruction or periostitis. No marrow edema or marrow replacement. Joint spaces are well maintained. Ligaments Intrinsic foot ligaments including the Lisfranc ligament are intact. Muscles and Tendons There is extensive intramuscular edema throughout the intrinsic foot musculature. Flexor and extensor tendons are intact without significant tenosynovial fluid collection. Soft tissues Focal skin defect at the plantar lateral aspect of the foot at the level of the fifth metatarsal base (series 7, image 6) with multiple foci of susceptibility tracking within the subcutaneous soft tissues which extend to the level of the mid fifth metatarsal diaphysis (series 7, image 10). Additional areas of susceptibility track along the more plantar aspect of the foot (series 8, image 17). There is skin thickening most pronounced over the plantar lateral aspect foot. Marked circumferential subcutaneous edema with prominent  fluid overlying the dorsal aspect of the midfoot. No well-defined or drainable fluid collection. IMPRESSION: 1. Right forefoot negative for acute osteomyelitis. 2. Skin ulceration at the plantar-lateral aspect underlying the fifth metatarsal base. Multiple foci of susceptibility suggesting gas within the local soft tissues which extends slightly distally and slightly plantar from the ulceration base. Although these  findings may represent air from skin ulcer dissecting in the soft tissues, gas related to a gas-forming infection would have a similar appearance. 3. Marked soft tissue swelling and edema most suggestive of cellulitis. No well-defined or drainable fluid collections. 4. Diffuse nonspecific myositis. Electronically Signed   By: Davina Poke M.D.   On: 01/01/2019 12:40        Scheduled Meds:  enoxaparin (LOVENOX) injection  40 mg Subcutaneous Q24H   insulin aspart  0-15 Units Subcutaneous TID WC   pantoprazole  40 mg Oral Daily   Continuous Infusions:  sodium chloride Stopped (01/01/19 0359)   aztreonam 1 g (01/01/19 1233)   vancomycin       LOS: 1 day   Time spent: 33min  Naziah Portee C Shaneice Barsanti, DO [facility]  If 7PM-7AM, please contact night-coverage [email or web address] Password [email or web address] 01/01/2019, 2:40 PM

## 2019-01-01 NOTE — Progress Notes (Signed)
Inpatient Diabetes Program Recommendations  AACE/ADA: New Consensus Statement on Inpatient Glycemic Control (2015)  Target Ranges:  Prepandial:   less than 140 mg/dL      Peak postprandial:   less than 180 mg/dL (1-2 hours)      Critically ill patients:  140 - 180 mg/dL   Lab Results  Component Value Date   GLUCAP 215 (H) 01/01/2019   HGBA1C 13.6 (H) 07/03/2018  Results for NICKEYA, TAK (MRN [id number]) as of 01/01/2019 15:04  Ref. Range 12/31/2018 18:39 12/31/2018 20:21 01/01/2019 07:52 01/01/2019 12:39  Glucose-Capillary Latest Ref Range: 70 - 99 mg/dL 256 (H) 246 (H) 237 (H) 215 (H)    Review of Glycemic Control  Diabetes history: type 2? Outpatient Diabetes medications: Lantus 40 Units daily, Humalog 8 units TID Current orders for Inpatient glycemic control: Novolog MODERATE correction scale TID  Inpatient Diabetes Program Recommendations:    Noted that blood sugars have been greater than 180 mg/dl.  Recommend starting Lantus 20 units daily (1/2 of home dose) and continuing Novolog MODERATE correction scale TID as ordered.  Harvel Ricks RN BSN CDE Diabetes Coordinator Pager: [phone]  8am-5pm

## 2019-01-01 NOTE — TOC Initial Note (Addendum)
Transition of Care [facility]) - Initial/Assessment Note    Patient Details  Name: Sheila Austin MRN: [id number] Date of Birth: 27-Apr-1986  Transition of Care [facility]) CM/SW Contact:    Marilu Favre, RN Phone Number: 01/01/2019, 2:48 PM  Clinical Narrative:                 Confirmed face sheet information with patient.   Patient does have a PCP at [place] [facility]. She had a visit in March. She is not sure of MD name . She believes she saw a PA or NP. Called Primary Care at [facility] , patient was last seen Sep 10, 2018 . Her PCP is DR Maximiano Coss.   Patient prefers to schedule follow up appointment herself.   For the 6-7 years has took care of her mother. She did not take care of herself.  Her mother passed Jan 2020. Patient exhausted all her FMLA and PAL to care for her mother.   Today is her birthday so she had PAL for today and tomorrow.   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  If needed she already has walker and 3 in 1 at home that belonged to her mother.   Expected Discharge Plan: Home/Self Care Barriers to Discharge: Continued Medical Work up   Patient Goals and CMS Choice Patient states their goals for this hospitalization and ongoing recovery are:: to go home CMS Medicare.gov Compare Post Acute Care list provided to:: Patient Choice offered to / list presented to : NA  Expected Discharge Plan and Services Expected Discharge Plan: Home/Self Care       Living arrangements for the past 2 months: Single Family Home                 DME Arranged: N/A         HH Arranged: NA          Prior Living Arrangements/Services Living arrangements for the past 2 months: Single Family Home Lives with:: Significant Other Patient language and need for interpreter reviewed:: Yes Do you feel safe going back to the place where you live?: Yes      Need for Family Participation in Patient Care: Yes (Comment) Care giver support  system in place?: Yes (comment) Current home services: DME Criminal Activity/Legal Involvement Pertinent to Current Situation/Hospitalization: No - Comment as needed  Activities of Daily Living Home Assistive Devices/Equipment: None ADL Screening (condition at time of admission) Patient's cognitive ability adequate to safely complete daily activities?: Yes Is the patient deaf or have difficulty hearing?: No Does the patient have difficulty seeing, even when wearing glasses/contacts?: No Does the patient have difficulty concentrating, remembering, or making decisions?: No Patient able to express need for assistance with ADLs?: Yes Does the patient have difficulty dressing or bathing?: No Independently performs ADLs?: Yes (appropriate for developmental age) Does the patient have difficulty walking or climbing stairs?: No Weakness of Legs: None Weakness of Arms/Hands: None  Permission Sought/Granted   Permission granted to share information with : No              Emotional Assessment Appearance:: Appears stated age Attitude/Demeanor/Rapport: Engaged Affect (typically observed): Accepting Orientation: : Oriented to Self, Oriented to Place, Oriented to  Time, Oriented to Situation Alcohol / Substance Use: Not Applicable Psych Involvement: No (comment)  Admission diagnosis:  diabetic foot  Patient Active Problem List   Diagnosis Date Noted  . Diabetic foot  infection ([place]) 12/31/2018  . Obesity 12/31/2018  . Facial cellulitis 07/02/2018  . Class 3 severe obesity with body mass index (BMI) of 40.0 to 44.9 in adult ([place]) 07/09/2017  . Abscess of right breast 02/24/2016  . BMI 45.0-49.9, adult ([place]) 09/29/2015  . Skin lesions - right lower extremity 12/07/2013  . Morbid obesity with BMI of 50.0-59.9, adult ([place]) 10/05/2011  . Obstructive sleep apnea 10/05/2011  . Hypertriglyceridemia 09/27/2011  . Migraine 09/07/2011  . Essential hypertension, benign 11/01/2008  . Gastroparesis  09/27/2008  . IRREGULAR MENSES 08/24/2008  . Diabetes mellitus out of control ([place]) 07/04/2006   PCP:  Patient, No Pcp Per Pharmacy:   [place], [place] - [address] [facility] [address] [place] [address] Phone: [phone] Fax: [phone]  [place]), [place] - [address] [place] ([place]  [address] Phone: [phone] Fax: [place], [place] [address] [place] [address] Phone: [phone] Fax: [phone]     Social Determinants of Health (SDOH) Interventions    Readmission Risk Interventions No flowsheet data found.

## 2019-01-01 NOTE — Progress Notes (Signed)
Pharmacy Antibiotic Note  Sheila Austin is a 33 y.o. female admitted on 12/31/2018 with right foot edema and erythema (diabetic foot infection).  Pt had an injury to right foot, lateral aspect, ~4 weeks ago and developed a small blister that was persistent despite wound care. Over the past wk, the blister evolved into a deep ulcer associated with right foot edema, erythema and tenderness. Pt was seen in outpt surgical clinic on 8/24 and was prescribed oral Bactrim. Today, pt's symptoms are worsening. Pharmacy has been consulted for vancomycin and aztreonam dosing.  Medical history includes: type 2 DM (uncontrolled), morbid obesity, HTN  Scr up to 1.92 this AM  Plan: Aztreonam to 1 gram iv Q 8 hours Vancomycin to 1 gram iv Q 24 hours Monitor renal function, WBC, temp, clinical improvement   Temp (24hrs), Avg:98.8 F (37.1 C), Min:97.6 F (36.4 C), Max:100 F (37.8 C)  Estimated Creatinine Clearance: 51.7 mL/min (A) (by C-G formula based on SCr of 1.92 mg/dL (H)).    Allergies  Allergen Reactions  . Bee Venom Anaphylaxis, Hives, Shortness Of Breath and Swelling  . Penicillins Anaphylaxis    Has patient had a PCN reaction causing immediate rash, facial/tongue/throat swelling, SOB or lightheadedness with hypotension: yes Has patient had a PCN reaction causing severe rash involving mucus membranes or skin necrosis: unknown Has patient had a PCN reaction that required hospitalization : yes Has patient had a PCN reaction occurring within the last 10 years: no If all of the above answers are "NO", then may proceed with Cephalosporin use.    Thank you Anette Guarneri, PharmD 01/01/2019 10:32 AM

## 2019-01-02 LAB — CBC
HCT: 25 % — ABNORMAL LOW (ref 36.0–46.0)
Hemoglobin: 8 g/dL — ABNORMAL LOW (ref 12.0–15.0)
MCH: 28.6 pg (ref 26.0–34.0)
MCHC: 32 g/dL (ref 30.0–36.0)
MCV: 89.3 fL (ref 80.0–100.0)
Platelets: 323 10*3/uL (ref 150–400)
RBC: 2.8 MIL/uL — ABNORMAL LOW (ref 3.87–5.11)
RDW: 11.9 % (ref 11.5–15.5)
WBC: 9.8 10*3/uL (ref 4.0–10.5)
nRBC: 0 % (ref 0.0–0.2)

## 2019-01-02 LAB — BASIC METABOLIC PANEL
Anion gap: 9 (ref 5–15)
BUN: 25 mg/dL — ABNORMAL HIGH (ref 6–20)
CO2: 23 mmol/L (ref 22–32)
Calcium: 8.3 mg/dL — ABNORMAL LOW (ref 8.9–10.3)
Chloride: 106 mmol/L (ref 98–111)
Creatinine, Ser: 1.95 mg/dL — ABNORMAL HIGH (ref 0.44–1.00)
GFR calc Af Amer: 39 mL/min — ABNORMAL LOW (ref >60)
GFR calc non Af Amer: 33 mL/min — ABNORMAL LOW (ref >60)
Glucose, Bld: 266 mg/dL — ABNORMAL HIGH (ref 70–99)
Potassium: 4.4 mmol/L (ref 3.5–5.1)
Sodium: 138 mmol/L (ref 135–145)

## 2019-01-02 LAB — GLUCOSE, CAPILLARY
Glucose-Capillary: 269 mg/dL — ABNORMAL HIGH (ref 70–99)
Glucose-Capillary: 234 mg/dL — ABNORMAL HIGH (ref 70–99)
Glucose-Capillary: 166 mg/dL — ABNORMAL HIGH (ref 70–99)
Glucose-Capillary: 155 mg/dL — ABNORMAL HIGH (ref 70–99)

## 2019-01-02 MED ORDER — INSULIN GLARGINE 100 UNIT/ML ~~LOC~~ SOLN
10.0000 [IU] | Freq: Every day | SUBCUTANEOUS | Status: DC
  Administered 2019-01-02 – 2019-01-03 (×2): 10 [IU] via SUBCUTANEOUS
  Filled 2019-01-02 (×2): qty 0.1

## 2019-01-02 MED ORDER — LIVING WELL WITH DIABETES BOOK
Freq: Once | Status: AC
  Administered 2019-01-02: 17:00:00
  Filled 2019-01-02: qty 1

## 2019-01-02 NOTE — Consult Note (Signed)
   [facility] CM Inpatient Consult   01/02/2019  Sheila Austin 10/30/1985 [id number]  Patient was assessed for [name] Management for community services. Patient was previously active with [name] Management for Diabetes follow up.    Plan: Patient will have follow up with [name] Coordinator for Employee Plan. Attempt to call patient, will continue to follow.    Of note, [facility] Care Management services does not replace or interfere with any services that are arranged by inpatient [facility] care management team.   For additional questions or referrals please contact:  Natividad Brood, RN BSN [facility] Liaison  [phone] business mobile phone Toll free office [phone]  Fax number: [phone] [email or web address]

## 2019-01-02 NOTE — Progress Notes (Signed)
   VASCULAR SURGERY ASSESSMENT & PLAN:   DIABETIC FOOT INFECTION: This patient was seen in the office earlier this week by both Dr. Annamarie Major and myself.  She has palpable pedal pulses with normal Doppler signals in the right foot.  She has a wound on the lateral aspect of her right foot with diabetes that is poorly controlled.  She was admitted for intravenous antibiotics.  Vascular surgery was consulted for management of the wound.  Her MRI did not show any abscess that needed to be drained.  However, on exam there is some devitalized tissue noted in the wound and a sinus tract that connects with a second small ulceration.  I have recommended debridement of the wound in the operating room and then hopefully this should be able to heal with aggressive wound care.  She feels very strongly about having to go back to work on Monday and feels confident that she can do to her dressing changes assuming the wound looks clean enough for this.  Her blood sugars are under much better control.   SUBJECTIVE:   No complaints.  Ambulating in the hall.  PHYSICAL EXAM:   Vitals:   01/01/19 0759 01/01/19 1507 01/01/19 2116 01/02/19 0405  BP: 131/84 139/83 107/69 107/72  Pulse: 82 87 92 83  Resp: 18 18 18 16   Temp: 98.2 F (36.8 C) 97.9 F (36.6 C) 98.2 F (36.8 C) 98 F (36.7 C)  TempSrc: Oral Oral Oral Oral  SpO2: 97% 100% 97% 94%  Weight:      Height:       I inspected her right foot wound.  She does have some persistent cellulitis and swelling.  However, this is improved.  There is some devitalized adipose tissue beneath the wound that and a tract that connects with a small ulceration.  I have recommended debridement of this in the operating room tomorrow.  LABS:   MRI foot: The MRI shows no evidence of osteomyelitis.  There is a small amount of gas in the soft tissue.  There is market soft tissue swelling and edema.  No well-defined or drainable fluid collection is identified.  Lab  Results  Component Value Date   WBC 9.8 01/02/2019   HGB 8.0 (L) 01/02/2019   HCT 25.0 (L) 01/02/2019   MCV 89.3 01/02/2019   PLT 323 01/02/2019   Lab Results  Component Value Date   CREATININE 1.95 (H) 01/02/2019   Lab Results  Component Value Date   INR 0.90 06/05/2012   CBG (last 3)  Recent Labs    01/01/19 1720 01/01/19 2117 01/02/19 0818  GLUCAP 156* 165* 269*    PROBLEM LIST:    Principal Problem:   Diabetic foot infection ([place]) Active Problems:   Diabetes mellitus out of control ([place])   Essential hypertension, benign   Morbid obesity with BMI of 50.0-59.9, adult (HCC)   CURRENT MEDS:   . enoxaparin (LOVENOX) injection  40 mg Subcutaneous Q24H  . insulin aspart  0-15 Units Subcutaneous TID WC  . insulin glargine  10 Units Subcutaneous Daily  . pantoprazole  40 mg Oral Daily    [name] Beeper: [id number] Office: [phone] 01/02/2019

## 2019-01-02 NOTE — H&P (View-Only) (Signed)
   VASCULAR SURGERY ASSESSMENT & PLAN:   DIABETIC FOOT INFECTION: This patient was seen in the office earlier this week by both Dr. Annamarie Major and myself.  She has palpable pedal pulses with normal Doppler signals in the right foot.  She has a wound on the lateral aspect of her right foot with diabetes that is poorly controlled.  She was admitted for intravenous antibiotics.  Vascular surgery was consulted for management of the wound.  Her MRI did not show any abscess that needed to be drained.  However, on exam there is some devitalized tissue noted in the wound and a sinus tract that connects with a second small ulceration.  I have recommended debridement of the wound in the operating room and then hopefully this should be able to heal with aggressive wound care.  She feels very strongly about having to go back to work on Monday and feels confident that she can do to her dressing changes assuming the wound looks clean enough for this.  Her blood sugars are under much better control.   SUBJECTIVE:   No complaints.  Ambulating in the hall.  PHYSICAL EXAM:   Vitals:   01/01/19 0759 01/01/19 1507 01/01/19 2116 01/02/19 0405  BP: 131/84 139/83 107/69 107/72  Pulse: 82 87 92 83  Resp: 18 18 18 16   Temp: 98.2 F (36.8 C) 97.9 F (36.6 C) 98.2 F (36.8 C) 98 F (36.7 C)  TempSrc: Oral Oral Oral Oral  SpO2: 97% 100% 97% 94%  Weight:      Height:       I inspected her right foot wound.  She does have some persistent cellulitis and swelling.  However, this is improved.  There is some devitalized adipose tissue beneath the wound that and a tract that connects with a small ulceration.  I have recommended debridement of this in the operating room tomorrow.  LABS:   MRI foot: The MRI shows no evidence of osteomyelitis.  There is a small amount of gas in the soft tissue.  There is market soft tissue swelling and edema.  No well-defined or drainable fluid collection is identified.  Lab  Results  Component Value Date   WBC 9.8 01/02/2019   HGB 8.0 (L) 01/02/2019   HCT 25.0 (L) 01/02/2019   MCV 89.3 01/02/2019   PLT 323 01/02/2019   Lab Results  Component Value Date   CREATININE 1.95 (H) 01/02/2019   Lab Results  Component Value Date   INR 0.90 06/05/2012   CBG (last 3)  Recent Labs    01/01/19 1720 01/01/19 2117 01/02/19 0818  GLUCAP 156* 165* 269*    PROBLEM LIST:    Principal Problem:   Diabetic foot infection ([place]) Active Problems:   Diabetes mellitus out of control ([place])   Essential hypertension, benign   Morbid obesity with BMI of 50.0-59.9, adult (HCC)   CURRENT MEDS:   . enoxaparin (LOVENOX) injection  40 mg Subcutaneous Q24H  . insulin aspart  0-15 Units Subcutaneous TID WC  . insulin glargine  10 Units Subcutaneous Daily  . pantoprazole  40 mg Oral Daily    [name] Beeper: [id number] Office: [phone] 01/02/2019

## 2019-01-02 NOTE — Progress Notes (Signed)
PROGRESS NOTE    Sheila Austin  [id number] DOB: [birth year] DOA: 12/31/2018 PCP: Maximiano Coss, NP   Brief Narrative:  Sheila Austin is a 33 y.o. female with medical history significant of type 2 diabetes mellitus and obesity.  Patient had an injury to her right foot lateral aspect about 4 weeks ago while walking with flip-flops outdoors.  She developed a small blister (lateral aspect) that was persistent despite local wound care.  Over last 7 days this blister evolved into a deep ulcer which was associated with right foot edema, erythema and tenderness.  There is no improving or worsening factors.  3 days ago she was febrile at home. She was seen by Dr. Trula Slade August 24, the outpatient surgical clinic.  He was prescribed oral antibiotic with Bactrim and scheduled for follow-up in 48 hours.  Follow-up outpatient visit today patient worsening symptoms with local edema erythema and worsening ulcer.  Her blood glucose was 400.  She was considered failed outpatient therapy she was referred for hospital for further treatment  Assessment & Plan:   Principal Problem:   Diabetic foot infection ([place]) Active Problems:   Diabetes mellitus out of control ([place])   Essential hypertension, benign   Morbid obesity with BMI of 50.0-59.9, adult ([place])   Sepsis 2/2 right foot cellulitis, osteomyelitis ruled out   Continue IV vancomycin and aztreonam (Anaphylaxis to penicillins) Patient tachycardic, leukocytosis at admission meeting sepsis criteria, now improved with IV fluids and antibiotics  MRI as below negative for acute osteomyelitis notable ulceration with notable gas discerning for advanced infection Pain well controlled (currently on ibuprofen and morphine).  Vascular following - possible need for debridement/I&D 8/29  Uncontrolled type 2 diabetes mellitus.  Adding long acting insulin @10u  daily; continue sliding scale Certainly tight glucose control will benefit her wound  healing. Appears to be noncompliant with home regimen or diet  Hypertension  Currently patient not taking any antihypertensive agents.  Anemia, likely acute on chronic of chronic disease -Hgb 8 today - baseline appears to be around 10 over the past 6 months to year -Follow with am labs - transfuse if less than 7 or symptomatic  Obesity -Will need outpatient follow-up.  DVT prophylaxis:  Enoxaparin  Code Status:  full  Family Communication: no family at the bedside   Disposition Plan: med-surg - pending surgical evaluation Consults called: vascular surgery   Admission status: Inpatient   Subjective: No acute issues or events overnight, pain currently well controlled, swelling appears to be improving per the patient.  Otherwise declines chest pain, shortness of breath, nausea, vomiting, diarrhea, constipation, headache, fevers, chills.  Objective: Vitals:   01/01/19 0759 01/01/19 1507 01/01/19 2116 01/02/19 0405  BP: 131/84 139/83 107/69 107/72  Pulse: 82 87 92 83  Resp: 18 18 18 16   Temp: 98.2 F (36.8 C) 97.9 F (36.6 C) 98.2 F (36.8 C) 98 F (36.7 C)  TempSrc: Oral Oral Oral Oral  SpO2: 97% 100% 97% 94%  Weight:      Height:        Intake/Output Summary (Last 24 hours) at 01/02/2019 0829 Last data filed at 01/02/2019 0509 Gross per 24 hour  Intake 1049.75 ml  Output --  Net 1049.75 ml   Filed Weights   12/31/18 1920  Weight: 117.7 kg    Examination:  General:  Pleasantly resting in bed, No acute distress. HEENT:  Normocephalic atraumatic.  Sclerae nonicteric, noninjected.  Extraocular movements intact bilaterally. Neck:  Without mass or deformity.  Trachea  is midline. Lungs:  Clear to auscultate bilaterally without rhonchi, wheeze, or rales. Heart:  Regular rate and rhythm.  Without murmurs, rubs, or gallops. Abdomen:  Soft, nontender, nondistended.  Without guarding or rebound. Extremities: Right foot markedly swollen, tender to palpation laterally over  fifth metatarsal, notably open ulceration, see imaging as below from vascular office.  Otherwise without cyanosis, clubbing, edema, or obvious deformity. Vascular:  Dorsalis pedis and posterior tibial pulses palpable bilaterally. Skin: Right foot as above, otherwise warm and dry, no erythema, no ulcerations.      Data Reviewed: I have personally reviewed following labs and imaging studies  CBC: Recent Labs  Lab 12/31/18 2020 01/01/19 0359 01/02/19 0502  WBC 11.9* 10.0 9.8  HGB 8.6* 7.6* 8.0*  HCT 25.7* 23.2* 25.0*  MCV 86.0 88.9 89.3  PLT 380 296 [id number]   Basic Metabolic Panel: Recent Labs  Lab 12/31/18 1920 01/01/19 0359 01/02/19 0502  NA 133* 133* 138  K 4.3 3.7 4.4  CL 98 100 106  CO2 25 23 23   GLUCOSE 263* 233* 266*  BUN 15 19 25*  CREATININE 1.50* 1.92* 1.95*  CALCIUM 8.6* 8.2* 8.3*   CBG: Recent Labs  Lab 01/01/19 0752 01/01/19 1239 01/01/19 1720 01/01/19 2117 01/02/19 0818  GLUCAP 237* 215* 156* 165* 269*    Recent Results (from the past 240 hour(s))  SARS CORONAVIRUS 2 (TAT 6-12 HRS) Nasal Swab Aptima Multi Swab     Status: None   Collection Time: 12/31/18  6:23 PM   Specimen: Aptima Multi Swab; Nasal Swab  Result Value Ref Range Status   SARS Coronavirus 2 NEGATIVE NEGATIVE Final    Comment: (NOTE) SARS-CoV-2 target nucleic acids are NOT DETECTED. The SARS-CoV-2 RNA is generally detectable in upper and lower respiratory specimens during the acute phase of infection. Negative results do not preclude SARS-CoV-2 infection, do not rule out co-infections with other pathogens, and should not be used as the sole basis for treatment or other patient management decisions. Negative results must be combined with clinical observations, patient history, and epidemiological information. The expected result is Negative. Fact Sheet for Patients: SugarRoll.be Fact Sheet for Healthcare  Providers: https://www.woods-mathews.com/ This test is not yet approved or cleared by the [place] FDA and  has been authorized for detection and/or diagnosis of SARS-CoV-2 by FDA under an Emergency Use Authorization (EUA). This EUA will remain  in effect (meaning this test can be used) for the duration of the COVID-19 declaration under Section 56 4(b)(1) of the Act, 21 U.S.C. section 360bbb-3(b)(1), unless the authorization is terminated or revoked sooner. Performed at [facility], [place] [address], [name], [place] [address]     Radiology Studies: Mr Foot Right Wo Contrast  Result Date: 01/01/2019 CLINICAL DATA:  Cellulitis, right foot pain EXAM: MRI OF THE RIGHT FOREFOOT WITHOUT CONTRAST TECHNIQUE: Multiplanar, multisequence MR imaging of the ankle was performed. No intravenous contrast was administered. COMPARISON:  Right ankle x-ray 02/08/2008 FINDINGS: Bones/Joint/Cartilage No acute fracture. No cortical destruction or periostitis. No marrow edema or marrow replacement. Joint spaces are well maintained. Ligaments Intrinsic foot ligaments including the Lisfranc ligament are intact. Muscles and Tendons There is extensive intramuscular edema throughout the intrinsic foot musculature. Flexor and extensor tendons are intact without significant tenosynovial fluid collection. Soft tissues Focal skin defect at the plantar lateral aspect of the foot at the level of the fifth metatarsal base (series 7, image 6) with multiple foci of susceptibility tracking within the subcutaneous soft tissues which extend to  the level of the mid fifth metatarsal diaphysis (series 7, image 10). Additional areas of susceptibility track along the more plantar aspect of the foot (series 8, image 17). There is skin thickening most pronounced over the plantar lateral aspect foot. Marked circumferential subcutaneous edema with prominent fluid overlying the dorsal aspect of the midfoot. No well-defined or  drainable fluid collection. IMPRESSION: 1. Right forefoot negative for acute osteomyelitis. 2. Skin ulceration at the plantar-lateral aspect underlying the fifth metatarsal base. Multiple foci of susceptibility suggesting gas within the local soft tissues which extends slightly distally and slightly plantar from the ulceration base. Although these findings may represent air from skin ulcer dissecting in the soft tissues, gas related to a gas-forming infection would have a similar appearance. 3. Marked soft tissue swelling and edema most suggestive of cellulitis. No well-defined or drainable fluid collections. 4. Diffuse nonspecific myositis. Electronically Signed   By: Davina Poke M.D.   On: 01/01/2019 12:40   Scheduled Meds:  enoxaparin (LOVENOX) injection  40 mg Subcutaneous Q24H   insulin aspart  0-15 Units Subcutaneous TID WC   pantoprazole  40 mg Oral Daily   Continuous Infusions:  sodium chloride Stopped (01/01/19 0359)   aztreonam 1 g (01/02/19 0218)   vancomycin 1,000 mg (01/01/19 2001)    LOS: 2 days   Time spent: 67min  Christinamarie Tall C Wilhelmina Hark, DO [facility]  If 7PM-7AM, please contact night-coverage [email or web address] Password [facility] 01/02/2019, 8:29 AM

## 2019-01-02 NOTE — Progress Notes (Signed)
Spoke with patient at the bedside. Patient know that she needs to take better care of herself. She was taking care of her mother for 6-7 years who passed away in 06-10-2022. She went through some depression at that time. Patient does have [place]. Was prescribed Lantus 40 units daily and Humalog Kwikpen 8 units TID in the past. She did express concern over cost of the Lantus and Humalog, that the insurance did not cover all of it.  Was asking about [employer] insulin pens, 70/30 insulin, for cost purposes. States that she would do best with set dosages to take instead of sliding scale.   Had some questions about amount of CHO recommended per meal. States that she really does not eat 3 meals every day, mainly a large dinner meal. Suggested that she stick with about 45-60 grams of CHO per meal. We talked about meal ideas. Will order Living Well with Diabetes booklet for her.   Will continue to follow blood sugars while in the hospital. Will add diabetes references to discharge papers.  Harvel Ricks RN BSN CDE Diabetes Coordinator Pager: [phone]  8am-5pm

## 2019-01-03 ENCOUNTER — Inpatient Hospital Stay (HOSPITAL_COMMUNITY): Payer: 59 | Admitting: Registered Nurse

## 2019-01-03 ENCOUNTER — Encounter (HOSPITAL_COMMUNITY): Payer: Self-pay | Admitting: Certified Registered Nurse Anesthetist

## 2019-01-03 ENCOUNTER — Encounter (HOSPITAL_COMMUNITY)
Admission: AD | Discharge status: Against Medical Advice | Payer: Self-pay | Source: Ambulatory Visit | Attending: Internal Medicine

## 2019-01-03 DIAGNOSIS — E11621 Type 2 diabetes mellitus with foot ulcer: Secondary | ICD-10-CM

## 2019-01-03 HISTORY — PX: WOUND DEBRIDEMENT: SHX247

## 2019-01-03 LAB — BASIC METABOLIC PANEL
Anion gap: 8 (ref 5–15)
BUN: 19 mg/dL (ref 6–20)
CO2: 24 mmol/L (ref 22–32)
Calcium: 8.3 mg/dL — ABNORMAL LOW (ref 8.9–10.3)
Chloride: 106 mmol/L (ref 98–111)
Creatinine, Ser: 1.34 mg/dL — ABNORMAL HIGH (ref 0.44–1.00)
GFR calc Af Amer: >60 mL/min (ref >60)
GFR calc non Af Amer: 53 mL/min — ABNORMAL LOW (ref >60)
Glucose, Bld: 175 mg/dL — ABNORMAL HIGH (ref 70–99)
Potassium: 4.1 mmol/L (ref 3.5–5.1)
Sodium: 138 mmol/L (ref 135–145)

## 2019-01-03 LAB — GLUCOSE, CAPILLARY
Glucose-Capillary: 158 mg/dL — ABNORMAL HIGH (ref 70–99)
Glucose-Capillary: 175 mg/dL — ABNORMAL HIGH (ref 70–99)
Glucose-Capillary: 165 mg/dL — ABNORMAL HIGH (ref 70–99)
Glucose-Capillary: 171 mg/dL — ABNORMAL HIGH (ref 70–99)
Glucose-Capillary: 262 mg/dL — ABNORMAL HIGH (ref 70–99)

## 2019-01-03 LAB — CBC
HCT: 23.7 % — ABNORMAL LOW (ref 36.0–46.0)
Hemoglobin: 7.7 g/dL — ABNORMAL LOW (ref 12.0–15.0)
MCH: 28.7 pg (ref 26.0–34.0)
MCHC: 32.5 g/dL (ref 30.0–36.0)
MCV: 88.4 fL (ref 80.0–100.0)
Platelets: 373 10*3/uL (ref 150–400)
RBC: 2.68 MIL/uL — ABNORMAL LOW (ref 3.87–5.11)
RDW: 11.9 % (ref 11.5–15.5)
WBC: 12.1 10*3/uL — ABNORMAL HIGH (ref 4.0–10.5)
nRBC: 0 % (ref 0.0–0.2)

## 2019-01-03 LAB — SURGICAL PCR SCREEN
MRSA, PCR: NEGATIVE
Staphylococcus aureus: POSITIVE — AB

## 2019-01-03 SURGERY — DEBRIDEMENT, WOUND
Anesthesia: General | Laterality: Right

## 2019-01-03 MED ORDER — MUPIROCIN 2 % EX OINT
1.0000 "application " | TOPICAL_OINTMENT | Freq: Two times a day (BID) | CUTANEOUS | Status: DC
  Administered 2019-01-03: 1 via NASAL
  Filled 2019-01-03: qty 22

## 2019-01-03 MED ORDER — FENTANYL CITRATE (PF) 250 MCG/5ML IJ SOLN
INTRAMUSCULAR | Status: AC
  Filled 2019-01-03: qty 5

## 2019-01-03 MED ORDER — OXYCODONE HCL 5 MG/5ML PO SOLN: 5.0000 mg | Freq: Once | ORAL | Status: DC | PRN

## 2019-01-03 MED ORDER — 0.9 % SODIUM CHLORIDE (POUR BTL) OPTIME
TOPICAL | Status: DC | PRN
  Administered 2019-01-03: 1000 mL

## 2019-01-03 MED ORDER — PROPOFOL 10 MG/ML IV BOLUS
INTRAVENOUS | Status: DC | PRN
  Administered 2019-01-03: 200 mg via INTRAVENOUS

## 2019-01-03 MED ORDER — LIDOCAINE 2% (20 MG/ML) 5 ML SYRINGE
INTRAMUSCULAR | Status: AC
  Filled 2019-01-03: qty 5

## 2019-01-03 MED ORDER — ONDANSETRON HCL 4 MG/2ML IJ SOLN
INTRAMUSCULAR | Status: AC
  Filled 2019-01-03: qty 2

## 2019-01-03 MED ORDER — LACTATED RINGERS IV SOLN
INTRAVENOUS | Status: DC
  Administered 2019-01-03: 08:00:00 via INTRAVENOUS

## 2019-01-03 MED ORDER — DEXAMETHASONE SODIUM PHOSPHATE 10 MG/ML IJ SOLN
INTRAMUSCULAR | Status: AC
  Filled 2019-01-03: qty 1

## 2019-01-03 MED ORDER — HEPARIN SODIUM (PORCINE) 1000 UNIT/ML IJ SOLN
INTRAMUSCULAR | Status: AC
  Filled 2019-01-03: qty 1

## 2019-01-03 MED ORDER — MORPHINE SULFATE (PF) 2 MG/ML IV SOLN: 2.0000 mg | INTRAVENOUS | Status: DC | PRN

## 2019-01-03 MED ORDER — CHLORHEXIDINE GLUCONATE CLOTH 2 % EX PADS
6.0000 | MEDICATED_PAD | Freq: Every day | CUTANEOUS | Status: DC
  Administered 2019-01-03: 10:00:00 6 via TOPICAL

## 2019-01-03 MED ORDER — PROMETHAZINE HCL 25 MG/ML IJ SOLN: 6.2500 mg | INTRAMUSCULAR | Status: DC | PRN

## 2019-01-03 MED ORDER — EPHEDRINE 5 MG/ML INJ
INTRAVENOUS | Status: AC
  Filled 2019-01-03: qty 10

## 2019-01-03 MED ORDER — OXYCODONE HCL 5 MG PO TABS: 5.0000 mg | ORAL_TABLET | Freq: Once | ORAL | Status: DC | PRN

## 2019-01-03 MED ORDER — MIDAZOLAM HCL 2 MG/2ML IJ SOLN
INTRAMUSCULAR | Status: AC
  Filled 2019-01-03: qty 2

## 2019-01-03 MED ORDER — PHENYLEPHRINE HCL (PRESSORS) 10 MG/ML IV SOLN
INTRAVENOUS | Status: AC
  Filled 2019-01-03: qty 1

## 2019-01-03 MED ORDER — PROTAMINE SULFATE 10 MG/ML IV SOLN
INTRAVENOUS | Status: AC
  Filled 2019-01-03: qty 5

## 2019-01-03 MED ORDER — DEXAMETHASONE SODIUM PHOSPHATE 10 MG/ML IJ SOLN
INTRAMUSCULAR | Status: DC | PRN
  Administered 2019-01-03: 4 mg via INTRAVENOUS

## 2019-01-03 MED ORDER — LIDOCAINE 2% (20 MG/ML) 5 ML SYRINGE
INTRAMUSCULAR | Status: DC | PRN
  Administered 2019-01-03: 100 mg via INTRAVENOUS

## 2019-01-03 MED ORDER — PROPOFOL 10 MG/ML IV BOLUS
INTRAVENOUS | Status: AC
  Filled 2019-01-03: qty 20

## 2019-01-03 MED ORDER — PHENYLEPHRINE 40 MCG/ML (10ML) SYRINGE FOR IV PUSH (FOR BLOOD PRESSURE SUPPORT)
PREFILLED_SYRINGE | INTRAVENOUS | Status: AC
  Filled 2019-01-03: qty 10

## 2019-01-03 MED ORDER — FENTANYL CITRATE (PF) 100 MCG/2ML IJ SOLN
INTRAMUSCULAR | Status: DC | PRN
  Administered 2019-01-03 (×3): 50 ug via INTRAVENOUS

## 2019-01-03 MED ORDER — HYDROMORPHONE HCL 1 MG/ML IJ SOLN: 0.2500 mg | INTRAMUSCULAR | Status: DC | PRN

## 2019-01-03 MED ORDER — ONDANSETRON HCL 4 MG/2ML IJ SOLN
INTRAMUSCULAR | Status: DC | PRN
  Administered 2019-01-03: 4 mg via INTRAVENOUS

## 2019-01-03 MED ORDER — OXYCODONE-ACETAMINOPHEN 5-325 MG PO TABS: 1.0000 | ORAL_TABLET | ORAL | Status: DC | PRN

## 2019-01-03 MED ORDER — MEPERIDINE HCL 25 MG/ML IJ SOLN: 6.2500 mg | INTRAMUSCULAR | Status: DC | PRN

## 2019-01-03 SURGICAL SUPPLY — 36 items
BNDG ELASTIC 4X5.8 VLCR STR LF (GAUZE/BANDAGES/DRESSINGS) ×1 IMPLANT
BNDG ELASTIC 6X5.8 VLCR STR LF (GAUZE/BANDAGES/DRESSINGS) IMPLANT
BNDG GAUZE ELAST 4 BULKY (GAUZE/BANDAGES/DRESSINGS) ×1 IMPLANT
CANISTER SUCT 3000ML PPV (MISCELLANEOUS) ×2 IMPLANT
COVER SURGICAL LIGHT HANDLE (MISCELLANEOUS) ×2 IMPLANT
COVER WAND RF STERILE (DRAPES) ×2 IMPLANT
DRAPE EXTREMITY T 121X128X90 (DISPOSABLE) ×1 IMPLANT
DRAPE HALF SHEET 40X57 (DRAPES) ×1 IMPLANT
DRAPE INCISE IOBAN 66X45 STRL (DRAPES) ×1 IMPLANT
DRAPE ORTHO SPLIT 77X108 STRL (DRAPES)
DRAPE SURG ORHT 6 SPLT 77X108 (DRAPES) ×2 IMPLANT
ELECT REM PT RETURN 9FT ADLT (ELECTROSURGICAL) ×2
ELECTRODE REM PT RTRN 9FT ADLT (ELECTROSURGICAL) ×1 IMPLANT
GAUZE SPONGE 4X4 12PLY STRL (GAUZE/BANDAGES/DRESSINGS) ×2 IMPLANT
GAUZE SPONGE 4X4 12PLY STRL LF (GAUZE/BANDAGES/DRESSINGS) IMPLANT
GLOVE BIO SURGEON STRL SZ7.5 (GLOVE) ×2 IMPLANT
GLOVE BIOGEL PI IND STRL 6.5 (GLOVE) IMPLANT
GLOVE BIOGEL PI IND STRL 8 (GLOVE) ×1 IMPLANT
GLOVE BIOGEL PI INDICATOR 6.5 (GLOVE) ×1
GLOVE BIOGEL PI INDICATOR 8 (GLOVE) ×1
GLOVE SURG SS PI 6.5 STRL IVOR (GLOVE) ×1 IMPLANT
GOWN STRL REUS W/ TWL LRG LVL3 (GOWN DISPOSABLE) ×3 IMPLANT
GOWN STRL REUS W/TWL LRG LVL3 (GOWN DISPOSABLE) ×6
KIT BASIN OR (CUSTOM PROCEDURE TRAY) ×2 IMPLANT
KIT TURNOVER KIT B (KITS) ×2 IMPLANT
NS IRRIG 1000ML POUR BTL (IV SOLUTION) ×2 IMPLANT
PACK CV ACCESS (CUSTOM PROCEDURE TRAY) IMPLANT
PACK GENERAL/GYN (CUSTOM PROCEDURE TRAY) ×1 IMPLANT
PAD ARMBOARD 7.5X6 YLW CONV (MISCELLANEOUS) ×4 IMPLANT
SUT ETHILON 3 0 PS 1 (SUTURE) IMPLANT
SUT VIC AB 2-0 CTB1 (SUTURE) IMPLANT
SUT VIC AB 3-0 SH 27 (SUTURE)
SUT VIC AB 3-0 SH 27X BRD (SUTURE) IMPLANT
SUT VICRYL 4-0 PS2 18IN ABS (SUTURE) IMPLANT
TOWEL GREEN STERILE (TOWEL DISPOSABLE) ×2 IMPLANT
WATER STERILE IRR 1000ML POUR (IV SOLUTION) ×2 IMPLANT

## 2019-01-03 NOTE — Anesthesia Postprocedure Evaluation (Signed)
Anesthesia Post Note  Patient: Lexicographer  Procedure(s) Performed: DEBRIDEMENT WOUND RIGHT LATERAL FOOT (Right )     Patient location during evaluation: PACU Anesthesia Type: General Level of consciousness: sedated and patient cooperative Pain management: pain level controlled Vital Signs Assessment: post-procedure vital signs reviewed and stable Respiratory status: spontaneous breathing Cardiovascular status: stable Anesthetic complications: no    Last Vitals:  Vitals:   01/03/19 1155 01/03/19 1244  BP: 134/68 (!) 157/93  Pulse: 84 80  Resp: (!) 21 18  Temp: 36.8 C 36.7 C  SpO2: 100% 100%    Last Pain:  Vitals:   01/03/19 1244  TempSrc: Oral  PainSc:                  Nolon Nations

## 2019-01-03 NOTE — Transfer of Care (Signed)
Immediate Anesthesia Transfer of Care Note  Patient: Sheila Austin  Procedure(s) Performed: DEBRIDEMENT WOUND RIGHT LATERAL FOOT (Right )  Patient Location: PACU  Anesthesia Type:General  Level of Consciousness: awake, oriented, patient cooperative and responds to stimulation  Airway & Oxygen Therapy: Patient Spontaneous RR  Post-op Assessment: Report given to RN and Post -op Vital signs reviewed and stable  Post vital signs: Reviewed and stable  Last Vitals:  Vitals Value Taken Time  BP 138/96 01/03/19 1140  Temp    Pulse 79 01/03/19 1141  Resp 18 01/03/19 1141  SpO2 98 % 01/03/19 1141  Vitals shown include unvalidated device data.  Last Pain:  Vitals:   01/03/19 0457  TempSrc: Oral  PainSc:       Patients Stated Pain Goal: 1 ([id number])  Complications: No apparent anesthesia complications

## 2019-01-03 NOTE — Progress Notes (Addendum)
Patient states unable to urinate at this time to provide urine pregnancy. Patient states not sexually active and no chance of being pregnant. Notified Chris, CRNA of same prior to patient being taken back to OR.

## 2019-01-03 NOTE — Anesthesia Preprocedure Evaluation (Addendum)
Anesthesia Evaluation  Patient identified by MRN, date of birth, ID band Patient awake    Reviewed: Allergy & Precautions, NPO status , Patient's Chart, lab work & pertinent test results  Airway Mallampati: II  TM Distance: >3 FB Neck ROM: Full    Dental  (+) Dental Advisory Given, Poor Dentition, Chipped,    Pulmonary sleep apnea ,    Pulmonary exam normal breath sounds clear to auscultation       Cardiovascular hypertension, negative cardio ROS Normal cardiovascular exam Rhythm:Regular Rate:Normal     Neuro/Psych  Headaches, negative psych ROS   GI/Hepatic Neg liver ROS, GERD  ,  Endo/Other  diabetesMorbid obesity  Renal/GU negative Renal ROS     Musculoskeletal negative musculoskeletal ROS (+)   Abdominal (+) + obese,   Peds  Hematology negative hematology ROS (+)   Anesthesia Other Findings   Reproductive/Obstetrics negative OB ROS                            Anesthesia Physical Anesthesia Plan  ASA: III  Anesthesia Plan: General   Post-op Pain Management:    Induction: Intravenous  PONV Risk Score and Plan: 4 or greater and Treatment may vary due to age or medical condition, Ondansetron, Dexamethasone and Midazolam  Airway Management Planned: LMA  Additional Equipment: None  Intra-op Plan:   Post-operative Plan: Extubation in OR  Informed Consent: I have reviewed the patients History and Physical, chart, labs and discussed the procedure including the risks, benefits and alternatives for the proposed anesthesia with the patient or authorized representative who has indicated his/her understanding and acceptance.     Dental advisory given  Plan Discussed with: CRNA  Anesthesia Plan Comments:         Anesthesia Quick Evaluation

## 2019-01-03 NOTE — Op Note (Signed)
    [name]    MRN: [id number] DOB: 07-05-1985    DATE OF OPERATION: 01/03/2019  PREOP DIAGNOSIS:    Diabetic foot infection right foot  POSTOP DIAGNOSIS:    Same  PROCEDURE:    Excisional debridement of wound on lateral aspect of right foot  SURGEON: Judeth Cornfield. Scot Dock, MD, FACS  ASSIST: None  ANESTHESIA: General  EBL: Minimal  INDICATIONS:    Sheila Austin is a 33 y.o. female with a diabetic foot infection.  She had a sinus tract connecting a small wound with a larger wound and I felt opening this up would simplify dressing changes and she had devitalized tissue at the base of the wound that needed debriding.  FINDINGS:   The tissue bled well after debridement.  TECHNIQUE:   The patient was taken to the operating room and received a general anesthetic.  The right foot was prepped and draped in usual sterile fashion.  Using a scalpel I connected the small sinus tract with the larger wound.  There was some undermining further distal in the foot and I extended the incision so that all of the wound could be exposed.  There was devitalized subcutaneous tissue including fat and fascia which were devitalized.  This was debrided sharply using scissors.  Some devitalized skin was excised using a scalpel.  There was good bleeding.  Electrocautery was used for hemostasis.  There was no evidence of exposed bone.  The wound was then packed with a wet-to-dry 4 x 4 soaked in normal saline.  Sterile dressing was applied.  The patient tolerated the procedure well was transferred to the recovery room in stable condition.  All needle and sponge counts were correct.  [name], MD, FACS [facility]  DATE OF DICTATION:   01/03/2019

## 2019-01-03 NOTE — Interval H&P Note (Signed)
History and Physical Interval Note:  01/03/2019 10:08 AM  Sheila Austin  has presented today for surgery, with the diagnosis of right foot wounds.  The various methods of treatment have been discussed with the patient and family. After consideration of risks, benefits and other options for treatment, the patient has consented to  Procedure(s): DEBRIDEMENT WOUND RIGHT FOOT (Right) as a surgical intervention.  The patient's history has been reviewed, patient examined, no change in status, stable for surgery.  I have reviewed the patient's chart and labs.  Questions were answered to the patient's satisfaction.     [name]

## 2019-01-03 NOTE — Progress Notes (Signed)
PROGRESS NOTE    Sheila Austin  [id number] DOB: 17-Feb-1986 DOA: 12/31/2018 PCP: Maximiano Coss, NP   Brief Narrative:  Sheila Austin is a 33 y.o. female with medical history significant of type 2 diabetes mellitus and obesity.  Patient had an injury to her right foot lateral aspect about 4 weeks ago while walking with flip-flops outdoors.  She developed a small blister (lateral aspect) that was persistent despite local wound care.  Over last 7 days this blister evolved into a deep ulcer which was associated with right foot edema, erythema and tenderness.  There is no improving or worsening factors.  3 days ago she was febrile at home. She was seen by Dr. Trula Slade August 24, the outpatient surgical clinic.  He was prescribed oral antibiotic with Bactrim and scheduled for follow-up in 48 hours.  Follow-up outpatient visit today patient worsening symptoms with local edema erythema and worsening ulcer.  Her blood glucose was 400.  She was considered failed outpatient therapy she was referred for hospital for further treatment  Assessment & Plan:   Principal Problem:   Diabetic foot infection ([place]) Active Problems:   Diabetes mellitus out of control ([place])   Essential hypertension, benign   Morbid obesity with BMI of 50.0-59.9, adult ([place])   Sepsis 2/2 right foot cellulitis, osteomyelitis ruled out   Continue IV vancomycin and aztreonam (Anaphylaxis to penicillins) Patient tachycardic, leukocytosis at admission meeting sepsis criteria, now improved with IV fluids and antibiotics  MRI as below negative for acute osteomyelitis notable ulceration with notable gas discerning for advanced infection Pain well controlled (currently on ibuprofen and morphine).  Vascular following -sharp debridement 01/03/2019 with Dr. Corliss Marcus -appreciate insight and recommendations  Uncontrolled type 2 diabetes mellitus.  Continue long-acting insulin 10u daily; continue sliding scale Certainly tight glucose  control will benefit her wound healing. Appears to be noncompliant with home regimen or diet  AKI, POA improving  INR continues to downtrend with IV and p.o. fluids  Hypertension  Currently patient not taking any antihypertensive agents.  Anemia, likely acute on chronic of chronic disease -Hgb 7.7 preoperatively- baseline appears to be around 10 over the past 6 months to year -Follow with repeat labs - transfuse if less than 7 or symptomatic  Obesity -Will need outpatient follow-up.  DVT prophylaxis:  Enoxaparin  Code Status:  full  Family Communication: no family at the bedside   Disposition Plan:  Pending clinical improvement and tolerance of procedure as above.  Ultimate disposition likely home. Consults called: vascular surgery   Admission status: Inpatient, continues to require surgical evaluation, pain control and IV antibiotics  Subjective: Denies chest pain, shortness of breath, nausea, vomiting, diarrhea, constipation, headache, fevers, chills.  Objective: Vitals:   01/02/19 0405 01/02/19 1501 01/02/19 2122 01/03/19 0457  BP: 107/72 (!) 147/90 121/75 (!) 126/91  Pulse: 83 86 92 80  Resp: 16 18 20 18   Temp: 98 F (36.7 C) 98.6 F (37 C) 98.8 F (37.1 C) 98.3 F (36.8 C)  TempSrc: Oral Oral Oral Oral  SpO2: 94% 100% 100% 96%  Weight:      Height:        Intake/Output Summary (Last 24 hours) at 01/03/2019 0820 Last data filed at 01/03/2019 [id number] Gross per 24 hour  Intake 1895.81 ml  Output -  Net 1895.81 ml   Filed Weights   12/31/18 1920  Weight: 117.7 kg    Examination:  General:  Pleasantly resting in bed, No acute distress. HEENT:  Normocephalic atraumatic.  Sclerae nonicteric, noninjected.  Extraocular movements intact bilaterally. Neck:  Without mass or deformity.  Trachea is midline. Lungs:  Clear to auscultate bilaterally without rhonchi, wheeze, or rales. Heart:  Regular rate and rhythm.  Without murmurs, rubs, or gallops. Abdomen:  Soft,  nontender, nondistended.  Without guarding or rebound. Extremities: Right foot bandage clean dry intact. Otherwise without cyanosis, clubbing, edema, or obvious deformity. Vascular:  Dorsalis pedis and posterior tibial pulses palpable bilaterally. Skin: Right foot as above, otherwise warm and dry, no erythema, no ulcerations.  Data Reviewed: I have personally reviewed following labs and imaging studies  CBC: Recent Labs  Lab 12/31/18 2020 01/01/19 0359 01/02/19 0502 01/03/19 0405  WBC 11.9* 10.0 9.8 12.1*  HGB 8.6* 7.6* 8.0* 7.7*  HCT 25.7* 23.2* 25.0* 23.7*  MCV 86.0 88.9 89.3 88.4  PLT 380 296 323 [id number]   Basic Metabolic Panel: Recent Labs  Lab 12/31/18 1920 01/01/19 0359 01/02/19 0502 01/03/19 0405  NA 133* 133* 138 138  K 4.3 3.7 4.4 4.1  CL 98 100 106 106  CO2 25 23 23 24   GLUCOSE 263* 233* 266* 175*  BUN 15 19 25* 19  CREATININE 1.50* 1.92* 1.95* 1.34*  CALCIUM 8.6* 8.2* 8.3* 8.3*   CBG: Recent Labs  Lab 01/02/19 0818 01/02/19 1150 01/02/19 1658 01/02/19 2120 01/03/19 0736  GLUCAP 269* 234* 166* 155* 158*    Recent Results (from the past 240 hour(s))  SARS CORONAVIRUS 2 (TAT 6-12 HRS) Nasal Swab Aptima Multi Swab     Status: None   Collection Time: 12/31/18  6:23 PM   Specimen: Aptima Multi Swab; Nasal Swab  Result Value Ref Range Status   SARS Coronavirus 2 NEGATIVE NEGATIVE Final    Comment: (NOTE) SARS-CoV-2 target nucleic acids are NOT DETECTED. The SARS-CoV-2 RNA is generally detectable in upper and lower respiratory specimens during the acute phase of infection. Negative results do not preclude SARS-CoV-2 infection, do not rule out co-infections with other pathogens, and should not be used as the sole basis for treatment or other patient management decisions. Negative results must be combined with clinical observations, patient history, and epidemiological information. The expected result is Negative. Fact Sheet for Patients:  SugarRoll.be Fact Sheet for Healthcare Providers: https://www.woods-mathews.com/ This test is not yet approved or cleared by the [place] FDA and  has been authorized for detection and/or diagnosis of SARS-CoV-2 by FDA under an Emergency Use Authorization (EUA). This EUA will remain  in effect (meaning this test can be used) for the duration of the COVID-19 declaration under Section 56 4(b)(1) of the Act, 21 U.S.C. section 360bbb-3(b)(1), unless the authorization is terminated or revoked sooner. Performed at [facility], [place] [address] [place], [place] [address]   Surgical pcr screen     Status: Abnormal   Collection Time: 01/02/19 10:35 PM   Specimen: Nasal Mucosa; Nasal Swab  Result Value Ref Range Status   MRSA, PCR NEGATIVE NEGATIVE Final   Staphylococcus aureus POSITIVE (A) NEGATIVE Final    Comment: (NOTE) The Xpert SA Assay (FDA approved for NASAL specimens in patients 5 years of age and older), is one component of a comprehensive surveillance program. It is not intended to diagnose infection nor to guide or monitor treatment. Performed at [facility], [place] [address], [place], [place] [address]     Radiology Studies: Mr Foot Right Wo Contrast  Result Date: 01/01/2019 CLINICAL DATA:  Cellulitis, right foot pain EXAM: MRI OF THE RIGHT FOREFOOT WITHOUT CONTRAST  TECHNIQUE: Multiplanar, multisequence MR imaging of the ankle was performed. No intravenous contrast was administered. COMPARISON:  Right ankle x-ray 02/08/2008 FINDINGS: Bones/Joint/Cartilage No acute fracture. No cortical destruction or periostitis. No marrow edema or marrow replacement. Joint spaces are well maintained. Ligaments Intrinsic foot ligaments including the Lisfranc ligament are intact. Muscles and Tendons There is extensive intramuscular edema throughout the intrinsic foot musculature. Flexor and extensor tendons are intact without significant  tenosynovial fluid collection. Soft tissues Focal skin defect at the plantar lateral aspect of the foot at the level of the fifth metatarsal base (series 7, image 6) with multiple foci of susceptibility tracking within the subcutaneous soft tissues which extend to the level of the mid fifth metatarsal diaphysis (series 7, image 10). Additional areas of susceptibility track along the more plantar aspect of the foot (series 8, image 17). There is skin thickening most pronounced over the plantar lateral aspect foot. Marked circumferential subcutaneous edema with prominent fluid overlying the dorsal aspect of the midfoot. No well-defined or drainable fluid collection. IMPRESSION: 1. Right forefoot negative for acute osteomyelitis. 2. Skin ulceration at the plantar-lateral aspect underlying the fifth metatarsal base. Multiple foci of susceptibility suggesting gas within the local soft tissues which extends slightly distally and slightly plantar from the ulceration base. Although these findings may represent air from skin ulcer dissecting in the soft tissues, gas related to a gas-forming infection would have a similar appearance. 3. Marked soft tissue swelling and edema most suggestive of cellulitis. No well-defined or drainable fluid collections. 4. Diffuse nonspecific myositis. Electronically Signed   By: Davina Poke M.D.   On: 01/01/2019 12:40   Scheduled Meds: . [MAR Hold] Chlorhexidine Gluconate Cloth  6 each Topical Daily  . [MAR Hold] enoxaparin (LOVENOX) injection  40 mg Subcutaneous Q24H  . [MAR Hold] insulin aspart  0-15 Units Subcutaneous TID WC  . [MAR Hold] insulin glargine  10 Units Subcutaneous Daily  . [MAR Hold] mupirocin ointment  1 application Nasal BID  . [MAR Hold] pantoprazole  40 mg Oral Daily   Continuous Infusions: . sodium chloride 50 mL/hr at 01/03/19 0635  . [MAR Hold] aztreonam Stopped (01/03/19 0326)  . lactated ringers 10 mL/hr at 01/03/19 0812  . [MAR Hold] vancomycin  Stopped (01/02/19 2258)    LOS: 3 days   Time spent: 21min  Scheryl Sanborn C Arvie Villarruel, DO [facility]  If 7PM-7AM, please contact night-coverage [email or web address] Password [email or web address] 01/03/2019, 8:20 AM

## 2019-01-03 NOTE — Progress Notes (Signed)
Patient signed AMA and Dr. Avon Gully made aware; claimed that her 31 year old daughter's babysitter has an emergency, explained to her that she needs to see her surgeon for her wound and antibiotics, PCP for her diabetes.

## 2019-01-03 NOTE — Discharge Summary (Signed)
DISCHARGE SUMMARY   Brief Narrative:  Sheila Austin a 33 y.o.femalewith medical history significant oftype 2 diabetes mellitus and obesity. Patient had an injury to her right foot lateral aspect about 4 weeks ago while walking with flip-flops outdoors. She developed a small blister (lateral aspect)that was persistent despite local wound care. Over last 7 days this blister evolved into a deep ulcer which was associated with right foot edema, erythema and tenderness. There is no improving or worsening factors. 3 days ago she was febrile at home. She was seen by Dr. Romona Curls 24,the outpatient surgical clinic. He was prescribed oral antibiotic with Bactrim and scheduled for follow-up in 48 hours. Follow-up outpatient visit today patient worsening symptoms with local edema erythema and worsening ulcer. Her blood glucose was 400.She was considered failed outpatient therapy she was referred for hospital for further treatment  Patient had a family emergency and left AMA this afternoon (01/03/19) after surgery. We discussed her high risk for surgical complications, infection, the risk of worsening infection and even death. She understood the risks and continued to leave AMA. Offered to call in Abx at least to cover her but she indicated she "already has antibiotics" at home and subsequently left AMA.  Assessment & Plan:   Principal Problem:   Diabetic foot infection ([place]) Active Problems:   Diabetes mellitus out of control ([place])   Essential hypertension, benign   Morbid obesity with BMI of 50.0-59.9, adult ([place])   Sepsis 2/2 right foot cellulitis, osteomyelitis ruled out Continue IV vancomycin and aztreonam (Anaphylaxis to penicillins) Patient tachycardic, leukocytosis at admission meeting sepsis criteria, now improved with IV fluids and antibiotics MRI as below negative for acute osteomyelitis notable ulceration with notable gas discerning for advanced infection Pain well  controlled (currently on ibuprofen and morphine).  Vascular following -sharp debridement 01/03/2019 with Dr. Corliss Marcus -appreciate insight and recommendations  Uncontrolled type 2 diabetes mellitus. Continue long-acting insulin 10u daily; continue sliding scale Certainly tight glucose control will benefit her wound healing. Appears to be noncompliant with home regimen or diet  AKI, POA improving  INR continues to downtrend with IV and p.o. fluids  Hypertension Currently patient not taking any antihypertensive agents.  Anemia, likely acute on chronic of chronic disease -Hgb 7.7 preoperatively- baseline appears to be around 10 over the past 6 months to year -Follow with repeat labs - transfuse if less than 7 or symptomatic  Obesity -Will need outpatient follow-up.  DVT prophylaxis:Enoxaparin Code Status:full Family Communication:no family at the bedside Disposition Plan: Pending clinical improvement and tolerance of procedure as above.  Ultimate disposition likely home. Consults called:vascular surgery Admission status:Inpatient, continues to require surgical evaluation, pain control and IV antibiotics  Subjective: Denies chest pain, shortness of breath, nausea, vomiting, diarrhea, constipation, headache, fevers, chills.  Objective: Vitals:   01/02/19 0405 01/02/19 1501 01/02/19 2122 01/03/19 0457  BP: 107/72 (!) 147/90 121/75 (!) 126/91  Pulse: 83 86 92 80  Resp: 16 18 20 18   Temp: 98 F (36.7 C) 98.6 F (37 C) 98.8 F (37.1 C) 98.3 F (36.8 C)  TempSrc: Oral Oral Oral Oral  SpO2: 94% 100% 100% 96%  Weight:      Height:        Intake/Output Summary (Last 24 hours) at 01/03/2019 0820 Last data filed at 01/03/2019 0635    Gross per 24 hour  Intake 1895.81 ml  Output -  Net 1895.81 ml      Filed Weights   12/31/18 1920  Weight: 117.7 kg  Examination:  General:  Pleasantly resting in bed, No acute distress. HEENT:   Normocephalic atraumatic.  Sclerae nonicteric, noninjected.  Extraocular movements intact bilaterally. Neck:  Without mass or deformity.  Trachea is midline. Lungs:  Clear to auscultate bilaterally without rhonchi, wheeze, or rales. Heart:  Regular rate and rhythm.  Without murmurs, rubs, or gallops. Abdomen:  Soft, nontender, nondistended.  Without guarding or rebound. Extremities: Right foot bandage clean dry intact. Otherwise without cyanosis, clubbing, edema, or obvious deformity. Vascular:  Dorsalis pedis and posterior tibial pulses palpable bilaterally. Skin: Right foot as above, otherwise warm and dry, no erythema, no ulcerations.  Data Reviewed: I have personally reviewed following labs and imaging studies  CBC: Last Labs         Recent Labs  Lab 12/31/18 2020 01/01/19 0359 01/02/19 0502 01/03/19 0405  WBC 11.9* 10.0 9.8 12.1*  HGB 8.6* 7.6* 8.0* 7.7*  HCT 25.7* 23.2* 25.0* 23.7*  MCV 86.0 88.9 89.3 88.4  PLT 380 296 323 373     Basic Metabolic Panel: Last Labs         Recent Labs  Lab 12/31/18 1920 01/01/19 0359 01/02/19 0502 01/03/19 0405  NA 133* 133* 138 138  K 4.3 3.7 4.4 4.1  CL 98 100 106 106  CO2 25 23 23 24   GLUCOSE 263* 233* 266* 175*  BUN 15 19 25* 19  CREATININE 1.50* 1.92* 1.95* 1.34*  CALCIUM 8.6* 8.2* 8.3* 8.3*     CBG: Last Labs          Recent Labs  Lab 01/02/19 0818 01/02/19 1150 01/02/19 1658 01/02/19 2120 01/03/19 0736  GLUCAP 269* 234* 166* 155* 158*             Recent Results (from the past 240 hour(s))  SARS CORONAVIRUS 2 (TAT 6-12 HRS) Nasal Swab Aptima Multi Swab     Status: None   Collection Time: 12/31/18  6:23 PM   Specimen: Aptima Multi Swab; Nasal Swab  Result Value Ref Range Status   SARS Coronavirus 2 NEGATIVE NEGATIVE Final    Comment: (NOTE) SARS-CoV-2 target nucleic acids are NOT DETECTED. The SARS-CoV-2 RNA is generally detectable in upper and lower respiratory specimens during the acute phase of  infection. Negative results do not preclude SARS-CoV-2 infection, do not rule out co-infections with other pathogens, and should not be used as the sole basis for treatment or other patient management decisions. Negative results must be combined with clinical observations, patient history, and epidemiological information. The expected result is Negative. Fact Sheet for Patients: SugarRoll.be Fact Sheet for Healthcare Providers: https://www.woods-mathews.com/ This test is not yet approved or cleared by the [place] FDA and  has been authorized for detection and/or diagnosis of SARS-CoV-2 by FDA under an Emergency Use Authorization (EUA). This EUA will remain  in effect (meaning this test can be used) for the duration of the COVID-19 declaration under Section 56 4(b)(1) of the Act, 21 U.S.C. section 360bbb-3(b)(1), unless the authorization is terminated or revoked sooner. Performed at [facility], [place] [address] [place], [place] [address]   Surgical pcr screen     Status: Abnormal   Collection Time: 01/02/19 10:35 PM   Specimen: Nasal Mucosa; Nasal Swab  Result Value Ref Range Status   MRSA, PCR NEGATIVE NEGATIVE Final   Staphylococcus aureus POSITIVE (A) NEGATIVE Final    Comment: (NOTE) The Xpert SA Assay (FDA approved for NASAL specimens in patients 64 years of age and older), is one component of  a comprehensive surveillance program. It is not intended to diagnose infection nor to guide or monitor treatment. Performed at [facility], [place] [address], [place], [place] [address]     Radiology Studies:  Imaging Results (Last 48 hours)  Mr Foot Right Wo Contrast  Result Date: 01/01/2019 CLINICAL DATA:  Cellulitis, right foot pain EXAM: MRI OF THE RIGHT FOREFOOT WITHOUT CONTRAST TECHNIQUE: Multiplanar, multisequence MR imaging of the ankle was performed. No intravenous contrast was administered. COMPARISON:   Right ankle x-ray 02/08/2008 FINDINGS: Bones/Joint/Cartilage No acute fracture. No cortical destruction or periostitis. No marrow edema or marrow replacement. Joint spaces are well maintained. Ligaments Intrinsic foot ligaments including the Lisfranc ligament are intact. Muscles and Tendons There is extensive intramuscular edema throughout the intrinsic foot musculature. Flexor and extensor tendons are intact without significant tenosynovial fluid collection. Soft tissues Focal skin defect at the plantar lateral aspect of the foot at the level of the fifth metatarsal base (series 7, image 6) with multiple foci of susceptibility tracking within the subcutaneous soft tissues which extend to the level of the mid fifth metatarsal diaphysis (series 7, image 10). Additional areas of susceptibility track along the more plantar aspect of the foot (series 8, image 17). There is skin thickening most pronounced over the plantar lateral aspect foot. Marked circumferential subcutaneous edema with prominent fluid overlying the dorsal aspect of the midfoot. No well-defined or drainable fluid collection. IMPRESSION: 1. Right forefoot negative for acute osteomyelitis. 2. Skin ulceration at the plantar-lateral aspect underlying the fifth metatarsal base. Multiple foci of susceptibility suggesting gas within the local soft tissues which extends slightly distally and slightly plantar from the ulceration base. Although these findings may represent air from skin ulcer dissecting in the soft tissues, gas related to a gas-forming infection would have a similar appearance. 3. Marked soft tissue swelling and edema most suggestive of cellulitis. No well-defined or drainable fluid collections. 4. Diffuse nonspecific myositis. Electronically Signed   By: Davina Poke M.D.   On: 01/01/2019 12:40    Scheduled Meds: . [MAR Hold] Chlorhexidine Gluconate Cloth  6 each Topical Daily  . [MAR Hold] enoxaparin (LOVENOX) injection  40 mg  Subcutaneous Q24H  . [MAR Hold] insulin aspart  0-15 Units Subcutaneous TID WC  . [MAR Hold] insulin glargine  10 Units Subcutaneous Daily  . [MAR Hold] mupirocin ointment  1 application Nasal BID  . [MAR Hold] pantoprazole  40 mg Oral Daily   Continuous Infusions: . sodium chloride 50 mL/hr at 01/03/19 0635  . [MAR Hold] aztreonam Stopped (01/03/19 0326)  . lactated ringers 10 mL/hr at 01/03/19 0812  . [MAR Hold] vancomycin Stopped (01/02/19 2258)    LOS: 3 days   Time spent: 36min  [place] Oborn C Mikkel Char[place], DO

## 2019-01-04 ENCOUNTER — Encounter (HOSPITAL_COMMUNITY): Payer: Self-pay | Admitting: Vascular Surgery

## 2019-01-05 ENCOUNTER — Other Ambulatory Visit: Payer: Self-pay | Admitting: *Deleted

## 2019-01-05 NOTE — Patient Outreach (Signed)
[facility] [facility]) Care Management  01/05/2019  Sheila Austin 10/06/1985 [id number]   Transition of care telephone call  Referral received: 01/01/19 Initial outreach: 01/05/19 Insurance: [place]  Initial unsuccessful telephone call to patient's preferred (home/mobile) number in order to complete transition of care assessment; no answer, left HIPAA compliant voicemail message requesting return call.   Objective: Per the electronic medical record, Sheila Austin was hospitalized at [facility] from 8/26-8/29 for right foot edema and redness after she injured her foot while wearing flip flops. She had excisional debridement of wound on lateral aspect of right foot on 01/03/19.  Due to a family emergency she left against medical advice on 8/29//20. Comorbidities include:HTN, migraine, OSA, fatty liver, gastroparesis, GERD, IDDM- most recent Hgb A1C= 13.6% on 07/03/18, hypertriglyceridemia, facial cellulitis, H pylori infection, s/p ectopic pregnancy  Plan: This RNCM will route unsuccessful outreach letter with [place] Management pamphlet and 24 hour Nurse Advice Line Magnet to [place] Management clinical pool to be mailed to patient's home address. This RNCM will attempt another outreach within 4 business days.  Barrington Ellison RN,CCM,CDE [place] Management Coordinator Office Phone [phone] Office Fax [phone]

## 2019-01-08 ENCOUNTER — Other Ambulatory Visit: Payer: Self-pay | Admitting: *Deleted

## 2019-01-08 ENCOUNTER — Ambulatory Visit: Payer: Self-pay | Admitting: *Deleted

## 2019-01-08 NOTE — Patient Outreach (Signed)
[facility]) Care Management  01/08/2019  NORVA PARGA 06/30/1985 [id number]   Transition of care telephone call  Referral received: 01/01/19 Initial outreach: 01/05/19 Insurance: [place]  Second attempt to reach [employer] at her preferred (home/mobile) number in order to complete transition of care assessment unsuccessful.  No answer, left HIPAA compliant voicemail requesting return call.   Objective: Per the electronic medical record, Linnet Urness was hospitalized at [facility] from 8/26-8/29 for right foot edema and redness after she injured her foot while wearing flip flops. She had excisional debridement of wound on lateral aspect of right foot on 01/03/19.  Due to a family emergency she left against medical advice on 8/29//20. Comorbidities include:HTN, migraine, OSA, fatty liver, gastroparesis, GERD, IDDM- most recent Hgb A1C= 13.6% on 07/03/18, hypertriglyceridemia, facial cellulitis, H pylori infection, s/p ectopic pregnancy  Plan:  If no return call from patient, RNCM will attempt a third and final outreach within 4 business days.  Barrington Ellison RN,CCM,CDE [place] Management Coordinator Office Phone [phone] Office Fax [phone]

## 2019-01-13 ENCOUNTER — Other Ambulatory Visit: Payer: Self-pay | Admitting: *Deleted

## 2019-01-13 NOTE — Patient Outreach (Addendum)
[facility]) Care Management  01/13/2019  Sheila Austin 11-02-1985 [id number]   Subjective: Telephone call to patient's home / mobile number, spoke with patient, and HIPAA verified.  Discussed [facility] Care Management [facility] Transition of care follow up, patient voiced understanding, and is in agreement to follow up at a later time.  Patient states is doing okay, not feeling the greatest today, is waiting for a call back from [employer] regarding Covid 19 test results, and will this RNCM back at a later time.     Objective: Per KPN (Knowledge Performance Now, point of care tool) and chart review, patient hospitalized 12/11/2018 - 01/03/2019 for  Diabetic foot infection right foot, status post Excisional debridement of wound on lateral aspect of right foot on 01/03/2019.  Patient left against medical advice due to family emergency.    Patient also has a history of hypertension.     Assessment: Assigned RNCM received [facility] Transition of care referral on 01/01/2019.    Transition of care follow up pending patient contact.      Plan:  Assigned RNCM Kelli Churn)  has sent unsuccessful outreach letter, [facility] pamphlet, will and proceed with case closure, within 10 business days if no return call.  In basket message update sent to assigned RNCM.     Dontre Laduca H. Annia Friendly, BSN, [place] Management [facility] Telephonic CM Phone: [phone] Fax: [phone]

## 2019-01-19 ENCOUNTER — Other Ambulatory Visit: Payer: Self-pay

## 2019-01-19 ENCOUNTER — Other Ambulatory Visit: Payer: Self-pay | Admitting: *Deleted

## 2019-01-19 ENCOUNTER — Ambulatory Visit (HOSPITAL_COMMUNITY)
Admission: RE | Admit: 2019-01-19 | Discharge: 2019-01-19 | Disposition: A | Discharge status: Home / Self Care | Payer: 59 | Source: Ambulatory Visit | Attending: Family | Admitting: Family

## 2019-01-19 DIAGNOSIS — E11621 Type 2 diabetes mellitus with foot ulcer: Secondary | ICD-10-CM

## 2019-01-19 DIAGNOSIS — L97519 Non-pressure chronic ulcer of other part of right foot with unspecified severity: Secondary | ICD-10-CM | POA: Diagnosis not present

## 2019-01-20 ENCOUNTER — Other Ambulatory Visit: Payer: Self-pay | Admitting: *Deleted

## 2019-01-20 NOTE — Patient Outreach (Signed)
[facility]) Care Management  01/20/2019  Sheila Austin November 27, 1985 [id number]   Transition of Care Case Closure- Unsuccessful Outreach Referral received: 01/01/19 Initial outreach: 01/05/19 Insurance: [place] Choice Plan  Objective: Per the electronic medical record, Sheila Austin was hospitalized at [facility] from 8/26-8/29 for right foot edema and redness after she injured her foot while wearing flip flops. She had excisional debridement of wound on lateral aspect of right foot on 01/03/19.  Due to a family emergency she left against medical advice on 8/29//20. Comorbidities include:HTN, migraine, OSA, fatty liver, gastroparesis, GERD, IDDM- most recent Hgb A1C= 13.6% on 07/03/18, hypertriglyceridemia, facial cellulitis, H pylori infection, s/p ectopic pregnancy  Assessment: Unable to complete post hospital discharge transition of care assessment; no return call from patient after 3 attempts and no response to request to contact this RNCM in unsuccessful outreach letter mailed to patient's home on  01/05/19.   Plan: Case closed to [place] Management services as it has been 10 business days since initial post hospital discharge outreach attempt.  Barrington Ellison RN,CCM,CDE [place] Management Coordinator Office Phone [phone] Office Fax [phone]

## 2019-01-28 ENCOUNTER — Ambulatory Visit (INDEPENDENT_AMBULATORY_CARE_PROVIDER_SITE_OTHER): Payer: Self-pay | Admitting: Vascular Surgery

## 2019-01-28 ENCOUNTER — Encounter: Payer: Self-pay | Admitting: Vascular Surgery

## 2019-01-28 ENCOUNTER — Other Ambulatory Visit: Payer: Self-pay

## 2019-01-28 VITALS — BP 140/96 | HR 114 | Temp 98.5°F | Resp 16 | Ht 62.0 in | Wt 248.0 lb

## 2019-01-28 DIAGNOSIS — E11621 Type 2 diabetes mellitus with foot ulcer: Secondary | ICD-10-CM

## 2019-01-28 DIAGNOSIS — L97519 Non-pressure chronic ulcer of other part of right foot with unspecified severity: Secondary | ICD-10-CM

## 2019-01-28 MED ORDER — CEPHALEXIN 500 MG PO CAPS: 500.0000 mg | ORAL_CAPSULE | Freq: Three times a day (TID) | ORAL | 1 refills | Status: DC

## 2019-01-28 MED FILL — CIPROFLOXACIN HCL 500 MG TA: 500 | 7 days supply | Qty: 14 | Fill #0

## 2019-01-28 NOTE — Progress Notes (Signed)
Patient name: Sheila Austin MRN: [id number] DOB: 07-Feb-1986 Sex: female  REASON FOR VISIT:   Follow-up of diabetic foot ulcer right foot.  HPI:   Sheila Austin is a pleasant 33 y.o. female who was originally seen by Dr. Annamarie Major on 12/29/2018 with a diabetic foot infection.  At that time she had triphasic Doppler signals in the right foot and was felt to have adequate circulation.  She ultimately required admission to the hospital for control of her diabetes and intravenous antibiotics.  On 01/03/2019 I performed excisional debridement of the wound on the lateral aspect of her right foot.  She is here today for a follow-up visit.  She was concerned because the wound had some odor to it.  She has been doing dressing changes daily with a non-mesh gauze.  She is not on antibiotics.  She denies fever or chills.  Current Outpatient Medications  Medication Sig Dispense Refill  . blood glucose meter kit and supplies Dispense based on patient and insurance preference. Use up to four times daily as directed. (FOR ICD-10 E10.9, E11.9). 1 each 0  . Insulin Glargine (LANTUS SOLOSTAR) 100 UNIT/ML Solostar Pen Inject 40 Units into the skin daily. 15 mL 0  . insulin lispro (HUMALOG KWIKPEN) 100 UNIT/ML KwikPen Inject 0.08 mLs (8 Units total) into the skin 3 (three) times daily before meals. 15 mL 0  . Insulin Pen Needle 31G X 6 MM MISC 1 pen by Does not apply route as directed. 100 each 0  . sulfamethoxazole-trimethoprim (BACTRIM) 400-80 MG tablet Take 1 tablet by mouth 2 (two) times daily. 28 tablet 0  . escitalopram (LEXAPRO) 10 MG tablet Take 1 tablet (10 mg total) by mouth daily. (Patient not taking: Reported on 01/28/2019) 90 tablet 0   No current facility-administered medications for this visit.     REVIEW OF SYSTEMS:  _0  denotes positive finding, _1  denotes negative finding Vascular    Leg swelling    Cardiac    Chest pain or chest pressure:    Shortness of breath upon exertion:     Short of breath when lying flat:    Irregular heart rhythm:    Constitutional    Fever or chills:     PHYSICAL EXAM:   Vitals:   01/28/19 1346  BP: (!) 140/96  Pulse: (!) 114  Resp: 16  Temp: 98.5 F (36.9 C)  TempSrc: Temporal  SpO2: 100%  Weight: 248 lb (112.5 kg)  Height: _2  (1.575 m)    GENERAL: The patient is a well-nourished female, in no acute distress. The vital signs are documented above. CARDIOVASCULAR: There is a regular rate and rhythm. PULMONARY: There is good air exchange bilaterally without wheezing or rales. RIGHT FOOT: I debrided some devitalized tissue from the base of her wound.  In probing with a Q-tip I do not palpate bone so I think the bone is covered.  The wound appears to be well perfused.  RIGHT FOOT:      The wound on the lateral aspect of the foot measures 5 cm in length by 1 cm in width.  It is approximately 1-1/2 cm deep.  DATA:   ARTERIAL DOPPLER STUDY: I did review the arterial Doppler studies that were done on 01/19/2019.  On the right side there was a triphasic dorsalis pedis and posterior tibial signal with an ABI of 100% and a toe pressure of 156 mmHg.  MEDICAL ISSUES:   DIABETIC FOOT INFECTION RIGHT FOOT: I have  written her a prescription for Keflex given the mild cellulitis associated with the wound.  In addition there is some odor associated with the wound.  I recommended that she do twice daily dressing changes wet-to-dry with mesh gauze.  We will see her back in approximately 2 weeks.  She knows to call sooner if she has problems.  [name] [facility] [phone]

## 2019-01-30 ENCOUNTER — Ambulatory Visit: Payer: Self-pay | Admitting: *Deleted

## 2019-01-30 NOTE — Telephone Encounter (Signed)
I returned pt's call.   She has been vomiting for 3 days.   Unable to keep down food (crackers) or fluids for more than an hour before the sudden urge hits her and she vomits.   She says the urge to vomit hits her all of a sudden and has happened about 10 times a day.     She also has Type II diabetes.   She is not on insulin.   Glucose staying between 150-220.   "That is actually low for me right now".   "I'm trying to do better with my carbs and my diet in general".   Based on her symptoms and the fact she has diabetes I have referred her to the ED.   She is [facility] the ED at [facility].    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   She is at work.  I asked if anyone could go with her such as a family member, etc.    She can drive herself as she is not far from the hospital.   I've already got something to vomit in in my car just in case.    I instructed her to be careful driving to the hospital.   She thanked me for my help.  Reason for Disposition . [1] SEVERE vomiting (e.g., 6 or more times/day) AND [2] present > 8 hours    Not able to keep down food or water for 3 days.   Also has diabetes.   Glucose staying between 150-220.  Answer Assessment - Initial Assessment Questions 1. VOMITING SEVERITY: "How many times have you vomited in the past 24 hours?"     - MILD:  1 - 2 times/day    - MODERATE: 3 - 5 times/day, decreased oral intake without significant weight loss or symptoms of dehydration    - SEVERE: 6 or more times/day, vomits everything or nearly everything, with significant weight loss, symptoms of dehydration      I can't keep anything down at all.   This has been 3 days.   No fever, no other symptoms.   Just vomiting and burning from the vomiting.   2. ONSET: "When did the vomiting begin?"      3 days ago 3. FLUIDS: "What fluids or food have you vomited up today?" "Have you been able to keep any fluids down?"     I can't keep anything down  for more than an hour. 4. ABDOMINAL PAIN: "Are your having any abdominal pain?" If yes : "How bad is it and what does it feel like?" (e.g., crampy, dull, intermittent, constant)      No 5. DIARRHEA: "Is there any diarrhea?" If so, ask: "How many times today?"      No 6. CONTACTS: "Is there anyone else in the family with the same symptoms?"      No 7. CAUSE: "What do you think is causing your vomiting?"     I don't know   It just hit me suddenly. 8. HYDRATION STATUS: "Any signs of dehydration?" (e.g., dry mouth [not only dry lips], too weak to stand) "When did you last urinate?"     I feel normal until the urge comes to vomit.   It just hits suddenly.    I had a scan many years ago that said I had gastroparesis.    9. OTHER SYMPTOMS: "Do you have any other symptoms?" (e.g., fever, headache, vertigo, vomiting blood or  coffee grounds, recent head injury)     I feel faint after I vomit.   No headaches. 10. PREGNANCY: "Is there any chance you are pregnant?" "When was your last menstrual period?"       No  Protocols used: [facility]

## 2019-01-31 ENCOUNTER — Other Ambulatory Visit: Payer: Self-pay

## 2019-01-31 ENCOUNTER — Inpatient Hospital Stay (HOSPITAL_COMMUNITY)
Admission: EM | Admit: 2019-01-31 | Discharge: 2019-02-09 | DRG: 853 | Disposition: A | Discharge status: Rehabilitation Facility | Payer: 59 | Attending: Internal Medicine | Admitting: Internal Medicine

## 2019-01-31 DIAGNOSIS — L02415 Cutaneous abscess of right lower limb: Secondary | ICD-10-CM | POA: Diagnosis present

## 2019-01-31 DIAGNOSIS — F4320 Adjustment disorder, unspecified: Secondary | ICD-10-CM | POA: Diagnosis not present

## 2019-01-31 DIAGNOSIS — L97509 Non-pressure chronic ulcer of other part of unspecified foot with unspecified severity: Secondary | ICD-10-CM | POA: Diagnosis not present

## 2019-01-31 DIAGNOSIS — Z818 Family history of other mental and behavioral disorders: Secondary | ICD-10-CM

## 2019-01-31 DIAGNOSIS — L0390 Cellulitis, unspecified: Secondary | ICD-10-CM | POA: Diagnosis not present

## 2019-01-31 DIAGNOSIS — IMO0002 Reserved for concepts with insufficient information to code with codable children: Secondary | ICD-10-CM | POA: Diagnosis present

## 2019-01-31 DIAGNOSIS — R590 Localized enlarged lymph nodes: Secondary | ICD-10-CM | POA: Diagnosis present

## 2019-01-31 DIAGNOSIS — Z8249 Family history of ischemic heart disease and other diseases of the circulatory system: Secondary | ICD-10-CM

## 2019-01-31 DIAGNOSIS — Z91030 Bee allergy status: Secondary | ICD-10-CM | POA: Diagnosis not present

## 2019-01-31 DIAGNOSIS — R739 Hyperglycemia, unspecified: Secondary | ICD-10-CM

## 2019-01-31 DIAGNOSIS — D649 Anemia, unspecified: Secondary | ICD-10-CM | POA: Diagnosis not present

## 2019-01-31 DIAGNOSIS — E1143 Type 2 diabetes mellitus with diabetic autonomic (poly)neuropathy: Secondary | ICD-10-CM | POA: Diagnosis present

## 2019-01-31 DIAGNOSIS — S91301A Unspecified open wound, right foot, initial encounter: Secondary | ICD-10-CM | POA: Diagnosis not present

## 2019-01-31 DIAGNOSIS — M869 Osteomyelitis, unspecified: Secondary | ICD-10-CM | POA: Diagnosis not present

## 2019-01-31 DIAGNOSIS — I10 Essential (primary) hypertension: Secondary | ICD-10-CM | POA: Diagnosis present

## 2019-01-31 DIAGNOSIS — A419 Sepsis, unspecified organism: Secondary | ICD-10-CM

## 2019-01-31 DIAGNOSIS — E43 Unspecified severe protein-calorie malnutrition: Secondary | ICD-10-CM | POA: Diagnosis not present

## 2019-01-31 DIAGNOSIS — Z8349 Family history of other endocrine, nutritional and metabolic diseases: Secondary | ICD-10-CM

## 2019-01-31 DIAGNOSIS — R7881 Bacteremia: Secondary | ICD-10-CM

## 2019-01-31 DIAGNOSIS — B961 Klebsiella pneumoniae [K. pneumoniae] as the cause of diseases classified elsewhere: Secondary | ICD-10-CM | POA: Diagnosis not present

## 2019-01-31 DIAGNOSIS — E871 Hypo-osmolality and hyponatremia: Secondary | ICD-10-CM | POA: Diagnosis present

## 2019-01-31 DIAGNOSIS — D631 Anemia in chronic kidney disease: Secondary | ICD-10-CM | POA: Diagnosis present

## 2019-01-31 DIAGNOSIS — K760 Fatty (change of) liver, not elsewhere classified: Secondary | ICD-10-CM | POA: Diagnosis present

## 2019-01-31 DIAGNOSIS — L03115 Cellulitis of right lower limb: Secondary | ICD-10-CM | POA: Diagnosis present

## 2019-01-31 DIAGNOSIS — Z89511 Acquired absence of right leg below knee: Secondary | ICD-10-CM

## 2019-01-31 DIAGNOSIS — E1165 Type 2 diabetes mellitus with hyperglycemia: Secondary | ICD-10-CM | POA: Diagnosis present

## 2019-01-31 DIAGNOSIS — R161 Splenomegaly, not elsewhere classified: Secondary | ICD-10-CM | POA: Diagnosis present

## 2019-01-31 DIAGNOSIS — K3184 Gastroparesis: Secondary | ICD-10-CM | POA: Diagnosis not present

## 2019-01-31 DIAGNOSIS — E1169 Type 2 diabetes mellitus with other specified complication: Secondary | ICD-10-CM | POA: Diagnosis present

## 2019-01-31 DIAGNOSIS — E11628 Type 2 diabetes mellitus with other skin complications: Secondary | ICD-10-CM | POA: Diagnosis not present

## 2019-01-31 DIAGNOSIS — M60073 Infective myositis, right foot: Secondary | ICD-10-CM | POA: Diagnosis present

## 2019-01-31 DIAGNOSIS — E118 Type 2 diabetes mellitus with unspecified complications: Secondary | ICD-10-CM | POA: Diagnosis not present

## 2019-01-31 DIAGNOSIS — M86171 Other acute osteomyelitis, right ankle and foot: Secondary | ICD-10-CM | POA: Diagnosis not present

## 2019-01-31 DIAGNOSIS — M868X7 Other osteomyelitis, ankle and foot: Secondary | ICD-10-CM | POA: Diagnosis not present

## 2019-01-31 DIAGNOSIS — E119 Type 2 diabetes mellitus without complications: Secondary | ICD-10-CM | POA: Diagnosis present

## 2019-01-31 DIAGNOSIS — G4733 Obstructive sleep apnea (adult) (pediatric): Secondary | ICD-10-CM | POA: Diagnosis present

## 2019-01-31 DIAGNOSIS — Z8261 Family history of arthritis: Secondary | ICD-10-CM

## 2019-01-31 DIAGNOSIS — Z20828 Contact with and (suspected) exposure to other viral communicable diseases: Secondary | ICD-10-CM | POA: Diagnosis not present

## 2019-01-31 DIAGNOSIS — I129 Hypertensive chronic kidney disease with stage 1 through stage 4 chronic kidney disease, or unspecified chronic kidney disease: Secondary | ICD-10-CM | POA: Diagnosis present

## 2019-01-31 DIAGNOSIS — R52 Pain, unspecified: Secondary | ICD-10-CM | POA: Diagnosis not present

## 2019-01-31 DIAGNOSIS — R1111 Vomiting without nausea: Secondary | ICD-10-CM | POA: Diagnosis not present

## 2019-01-31 DIAGNOSIS — L089 Local infection of the skin and subcutaneous tissue, unspecified: Secondary | ICD-10-CM | POA: Diagnosis not present

## 2019-01-31 DIAGNOSIS — L97513 Non-pressure chronic ulcer of other part of right foot with necrosis of muscle: Secondary | ICD-10-CM | POA: Diagnosis not present

## 2019-01-31 DIAGNOSIS — N184 Chronic kidney disease, stage 4 (severe): Secondary | ICD-10-CM | POA: Diagnosis not present

## 2019-01-31 DIAGNOSIS — E11621 Type 2 diabetes mellitus with foot ulcer: Secondary | ICD-10-CM | POA: Diagnosis present

## 2019-01-31 DIAGNOSIS — A4152 Sepsis due to Pseudomonas: Principal | ICD-10-CM | POA: Diagnosis present

## 2019-01-31 DIAGNOSIS — Z825 Family history of asthma and other chronic lower respiratory diseases: Secondary | ICD-10-CM

## 2019-01-31 DIAGNOSIS — N183 Chronic kidney disease, stage 3 unspecified: Secondary | ICD-10-CM | POA: Diagnosis present

## 2019-01-31 DIAGNOSIS — E1122 Type 2 diabetes mellitus with diabetic chronic kidney disease: Secondary | ICD-10-CM | POA: Diagnosis present

## 2019-01-31 DIAGNOSIS — R6520 Severe sepsis without septic shock: Secondary | ICD-10-CM | POA: Diagnosis present

## 2019-01-31 DIAGNOSIS — R112 Nausea with vomiting, unspecified: Secondary | ICD-10-CM

## 2019-01-31 DIAGNOSIS — L97529 Non-pressure chronic ulcer of other part of left foot with unspecified severity: Secondary | ICD-10-CM | POA: Diagnosis not present

## 2019-01-31 DIAGNOSIS — L02611 Cutaneous abscess of right foot: Secondary | ICD-10-CM | POA: Diagnosis present

## 2019-01-31 DIAGNOSIS — Z880 Allergy status to penicillin: Secondary | ICD-10-CM

## 2019-01-31 DIAGNOSIS — K219 Gastro-esophageal reflux disease without esophagitis: Secondary | ICD-10-CM | POA: Diagnosis present

## 2019-01-31 DIAGNOSIS — B955 Unspecified streptococcus as the cause of diseases classified elsewhere: Secondary | ICD-10-CM | POA: Diagnosis present

## 2019-01-31 DIAGNOSIS — B954 Other streptococcus as the cause of diseases classified elsewhere: Secondary | ICD-10-CM | POA: Diagnosis not present

## 2019-01-31 DIAGNOSIS — Z841 Family history of disorders of kidney and ureter: Secondary | ICD-10-CM

## 2019-01-31 DIAGNOSIS — Z6841 Body Mass Index (BMI) 40.0 and over, adult: Secondary | ICD-10-CM

## 2019-01-31 DIAGNOSIS — Z978 Presence of other specified devices: Secondary | ICD-10-CM | POA: Diagnosis not present

## 2019-01-31 DIAGNOSIS — R000 Tachycardia, unspecified: Secondary | ICD-10-CM | POA: Diagnosis not present

## 2019-01-31 DIAGNOSIS — Z823 Family history of stroke: Secondary | ICD-10-CM

## 2019-01-31 DIAGNOSIS — A498 Other bacterial infections of unspecified site: Secondary | ICD-10-CM

## 2019-01-31 DIAGNOSIS — Z9119 Patient's noncompliance with other medical treatment and regimen: Secondary | ICD-10-CM

## 2019-01-31 DIAGNOSIS — R918 Other nonspecific abnormal finding of lung field: Secondary | ICD-10-CM | POA: Diagnosis not present

## 2019-01-31 DIAGNOSIS — D62 Acute posthemorrhagic anemia: Secondary | ICD-10-CM | POA: Diagnosis not present

## 2019-01-31 DIAGNOSIS — Z833 Family history of diabetes mellitus: Secondary | ICD-10-CM

## 2019-01-31 DIAGNOSIS — D509 Iron deficiency anemia, unspecified: Secondary | ICD-10-CM | POA: Diagnosis present

## 2019-01-31 DIAGNOSIS — G8918 Other acute postprocedural pain: Secondary | ICD-10-CM

## 2019-01-31 HISTORY — DX: Morbid (severe) obesity due to excess calories: E66.01

## 2019-02-01 ENCOUNTER — Emergency Department (HOSPITAL_COMMUNITY): Payer: 59

## 2019-02-01 ENCOUNTER — Inpatient Hospital Stay (HOSPITAL_COMMUNITY): Payer: 59

## 2019-02-01 ENCOUNTER — Encounter (HOSPITAL_COMMUNITY): Payer: Self-pay | Admitting: Emergency Medicine

## 2019-02-01 DIAGNOSIS — Z20828 Contact with and (suspected) exposure to other viral communicable diseases: Secondary | ICD-10-CM | POA: Diagnosis present

## 2019-02-01 DIAGNOSIS — F4320 Adjustment disorder, unspecified: Secondary | ICD-10-CM | POA: Diagnosis not present

## 2019-02-01 DIAGNOSIS — R000 Tachycardia, unspecified: Secondary | ICD-10-CM | POA: Diagnosis not present

## 2019-02-01 DIAGNOSIS — Z6841 Body Mass Index (BMI) 40.0 and over, adult: Secondary | ICD-10-CM | POA: Diagnosis not present

## 2019-02-01 DIAGNOSIS — E11621 Type 2 diabetes mellitus with foot ulcer: Secondary | ICD-10-CM | POA: Diagnosis present

## 2019-02-01 DIAGNOSIS — M25861 Other specified joint disorders, right knee: Secondary | ICD-10-CM | POA: Diagnosis not present

## 2019-02-01 DIAGNOSIS — D62 Acute posthemorrhagic anemia: Secondary | ICD-10-CM | POA: Diagnosis not present

## 2019-02-01 DIAGNOSIS — R590 Localized enlarged lymph nodes: Secondary | ICD-10-CM | POA: Diagnosis present

## 2019-02-01 DIAGNOSIS — E1169 Type 2 diabetes mellitus with other specified complication: Secondary | ICD-10-CM | POA: Diagnosis not present

## 2019-02-01 DIAGNOSIS — R52 Pain, unspecified: Secondary | ICD-10-CM | POA: Diagnosis present

## 2019-02-01 DIAGNOSIS — M868X7 Other osteomyelitis, ankle and foot: Secondary | ICD-10-CM | POA: Diagnosis present

## 2019-02-01 DIAGNOSIS — Z880 Allergy status to penicillin: Secondary | ICD-10-CM | POA: Diagnosis not present

## 2019-02-01 DIAGNOSIS — Z4781 Encounter for orthopedic aftercare following surgical amputation: Secondary | ICD-10-CM | POA: Diagnosis not present

## 2019-02-01 DIAGNOSIS — M86171 Other acute osteomyelitis, right ankle and foot: Secondary | ICD-10-CM | POA: Diagnosis not present

## 2019-02-01 DIAGNOSIS — E11628 Type 2 diabetes mellitus with other skin complications: Secondary | ICD-10-CM | POA: Diagnosis not present

## 2019-02-01 DIAGNOSIS — A419 Sepsis, unspecified organism: Secondary | ICD-10-CM | POA: Diagnosis not present

## 2019-02-01 DIAGNOSIS — S88111A Complete traumatic amputation at level between knee and ankle, right lower leg, initial encounter: Secondary | ICD-10-CM | POA: Diagnosis not present

## 2019-02-01 DIAGNOSIS — R1111 Vomiting without nausea: Secondary | ICD-10-CM | POA: Diagnosis not present

## 2019-02-01 DIAGNOSIS — Z978 Presence of other specified devices: Secondary | ICD-10-CM | POA: Diagnosis not present

## 2019-02-01 DIAGNOSIS — M25461 Effusion, right knee: Secondary | ICD-10-CM | POA: Diagnosis not present

## 2019-02-01 DIAGNOSIS — Z794 Long term (current) use of insulin: Secondary | ICD-10-CM | POA: Diagnosis not present

## 2019-02-01 DIAGNOSIS — E118 Type 2 diabetes mellitus with unspecified complications: Secondary | ICD-10-CM | POA: Diagnosis not present

## 2019-02-01 DIAGNOSIS — G8918 Other acute postprocedural pain: Secondary | ICD-10-CM | POA: Diagnosis not present

## 2019-02-01 DIAGNOSIS — B961 Klebsiella pneumoniae [K. pneumoniae] as the cause of diseases classified elsewhere: Secondary | ICD-10-CM | POA: Diagnosis not present

## 2019-02-01 DIAGNOSIS — L0390 Cellulitis, unspecified: Secondary | ICD-10-CM | POA: Diagnosis not present

## 2019-02-01 DIAGNOSIS — R161 Splenomegaly, not elsewhere classified: Secondary | ICD-10-CM | POA: Diagnosis present

## 2019-02-01 DIAGNOSIS — R918 Other nonspecific abnormal finding of lung field: Secondary | ICD-10-CM | POA: Diagnosis not present

## 2019-02-01 DIAGNOSIS — I96 Gangrene, not elsewhere classified: Secondary | ICD-10-CM | POA: Diagnosis not present

## 2019-02-01 DIAGNOSIS — L97513 Non-pressure chronic ulcer of other part of right foot with necrosis of muscle: Secondary | ICD-10-CM | POA: Diagnosis not present

## 2019-02-01 DIAGNOSIS — D649 Anemia, unspecified: Secondary | ICD-10-CM | POA: Diagnosis not present

## 2019-02-01 DIAGNOSIS — A4152 Sepsis due to Pseudomonas: Secondary | ICD-10-CM | POA: Diagnosis present

## 2019-02-01 DIAGNOSIS — B954 Other streptococcus as the cause of diseases classified elsewhere: Secondary | ICD-10-CM | POA: Diagnosis not present

## 2019-02-01 DIAGNOSIS — K3184 Gastroparesis: Secondary | ICD-10-CM | POA: Diagnosis not present

## 2019-02-01 DIAGNOSIS — N183 Chronic kidney disease, stage 3 unspecified: Secondary | ICD-10-CM | POA: Diagnosis present

## 2019-02-01 DIAGNOSIS — L089 Local infection of the skin and subcutaneous tissue, unspecified: Secondary | ICD-10-CM | POA: Diagnosis not present

## 2019-02-01 DIAGNOSIS — N184 Chronic kidney disease, stage 4 (severe): Secondary | ICD-10-CM | POA: Diagnosis present

## 2019-02-01 DIAGNOSIS — L02415 Cutaneous abscess of right lower limb: Secondary | ICD-10-CM | POA: Diagnosis not present

## 2019-02-01 DIAGNOSIS — E1165 Type 2 diabetes mellitus with hyperglycemia: Secondary | ICD-10-CM | POA: Diagnosis not present

## 2019-02-01 DIAGNOSIS — I10 Essential (primary) hypertension: Secondary | ICD-10-CM | POA: Diagnosis not present

## 2019-02-01 DIAGNOSIS — S88111S Complete traumatic amputation at level between knee and ankle, right lower leg, sequela: Secondary | ICD-10-CM | POA: Diagnosis not present

## 2019-02-01 DIAGNOSIS — I129 Hypertensive chronic kidney disease with stage 1 through stage 4 chronic kidney disease, or unspecified chronic kidney disease: Secondary | ICD-10-CM | POA: Diagnosis present

## 2019-02-01 DIAGNOSIS — L02611 Cutaneous abscess of right foot: Secondary | ICD-10-CM | POA: Diagnosis present

## 2019-02-01 DIAGNOSIS — E46 Unspecified protein-calorie malnutrition: Secondary | ICD-10-CM | POA: Diagnosis not present

## 2019-02-01 DIAGNOSIS — Z89511 Acquired absence of right leg below knee: Secondary | ICD-10-CM | POA: Diagnosis not present

## 2019-02-01 DIAGNOSIS — M60073 Infective myositis, right foot: Secondary | ICD-10-CM | POA: Diagnosis present

## 2019-02-01 DIAGNOSIS — E43 Unspecified severe protein-calorie malnutrition: Secondary | ICD-10-CM | POA: Diagnosis not present

## 2019-02-01 DIAGNOSIS — E871 Hypo-osmolality and hyponatremia: Secondary | ICD-10-CM | POA: Diagnosis not present

## 2019-02-01 DIAGNOSIS — L97919 Non-pressure chronic ulcer of unspecified part of right lower leg with unspecified severity: Secondary | ICD-10-CM | POA: Diagnosis not present

## 2019-02-01 DIAGNOSIS — R7881 Bacteremia: Secondary | ICD-10-CM | POA: Diagnosis not present

## 2019-02-01 DIAGNOSIS — Z91030 Bee allergy status: Secondary | ICD-10-CM | POA: Diagnosis not present

## 2019-02-01 DIAGNOSIS — L03115 Cellulitis of right lower limb: Secondary | ICD-10-CM | POA: Diagnosis present

## 2019-02-01 DIAGNOSIS — M869 Osteomyelitis, unspecified: Secondary | ICD-10-CM | POA: Diagnosis not present

## 2019-02-01 DIAGNOSIS — E119 Type 2 diabetes mellitus without complications: Secondary | ICD-10-CM | POA: Diagnosis present

## 2019-02-01 DIAGNOSIS — S91301A Unspecified open wound, right foot, initial encounter: Secondary | ICD-10-CM | POA: Diagnosis not present

## 2019-02-01 DIAGNOSIS — E875 Hyperkalemia: Secondary | ICD-10-CM | POA: Diagnosis not present

## 2019-02-01 DIAGNOSIS — D631 Anemia in chronic kidney disease: Secondary | ICD-10-CM | POA: Diagnosis present

## 2019-02-01 DIAGNOSIS — L97509 Non-pressure chronic ulcer of other part of unspecified foot with unspecified severity: Secondary | ICD-10-CM | POA: Diagnosis not present

## 2019-02-01 DIAGNOSIS — M7989 Other specified soft tissue disorders: Secondary | ICD-10-CM | POA: Diagnosis not present

## 2019-02-01 DIAGNOSIS — A4159 Other Gram-negative sepsis: Secondary | ICD-10-CM | POA: Diagnosis not present

## 2019-02-01 DIAGNOSIS — E1143 Type 2 diabetes mellitus with diabetic autonomic (poly)neuropathy: Secondary | ICD-10-CM | POA: Diagnosis present

## 2019-02-01 LAB — COMPREHENSIVE METABOLIC PANEL
ALT: 12 U/L (ref 0–44)
AST: 15 U/L (ref 15–41)
Albumin: 2 g/dL — ABNORMAL LOW (ref 3.5–5.0)
Alkaline Phosphatase: 215 U/L — ABNORMAL HIGH (ref 38–126)
Anion gap: 15 (ref 5–15)
BUN: 33 mg/dL — ABNORMAL HIGH (ref 6–20)
CO2: 30 mmol/L (ref 22–32)
Calcium: 8.7 mg/dL — ABNORMAL LOW (ref 8.9–10.3)
Chloride: 81 mmol/L — ABNORMAL LOW (ref 98–111)
Creatinine, Ser: 2.17 mg/dL — ABNORMAL HIGH (ref 0.44–1.00)
GFR calc Af Amer: 34 mL/min — ABNORMAL LOW (ref >60)
GFR calc non Af Amer: 29 mL/min — ABNORMAL LOW (ref >60)
Glucose, Bld: 550 mg/dL (ref 70–99)
Potassium: 3.7 mmol/L (ref 3.5–5.1)
Sodium: 126 mmol/L — ABNORMAL LOW (ref 135–145)
Total Bilirubin: 1.2 mg/dL (ref 0.3–1.2)
Total Protein: 7.9 g/dL (ref 6.5–8.1)

## 2019-02-01 LAB — HEMOGLOBIN A1C
Hgb A1c MFr Bld: 10.8 % — ABNORMAL HIGH (ref 4.8–5.6)
Mean Plasma Glucose: 263.26 mg/dL

## 2019-02-01 LAB — APTT: aPTT: 32 seconds (ref 24–36)

## 2019-02-01 LAB — PREALBUMIN: Prealbumin: 5.3 mg/dL — ABNORMAL LOW (ref 18–38)

## 2019-02-01 LAB — SEDIMENTATION RATE: Sed Rate: >140 mm/hr — ABNORMAL HIGH (ref 0–22)

## 2019-02-01 LAB — BASIC METABOLIC PANEL
Anion gap: 13 (ref 5–15)
Anion gap: 15 (ref 5–15)
BUN: 32 mg/dL — ABNORMAL HIGH (ref 6–20)
BUN: 29 mg/dL — ABNORMAL HIGH (ref 6–20)
CO2: 28 mmol/L (ref 22–32)
CO2: 27 mmol/L (ref 22–32)
Calcium: 8.2 mg/dL — ABNORMAL LOW (ref 8.9–10.3)
Calcium: 8.6 mg/dL — ABNORMAL LOW (ref 8.9–10.3)
Chloride: 92 mmol/L — ABNORMAL LOW (ref 98–111)
Chloride: 93 mmol/L — ABNORMAL LOW (ref 98–111)
Creatinine, Ser: 1.88 mg/dL — ABNORMAL HIGH (ref 0.44–1.00)
Creatinine, Ser: 1.61 mg/dL — ABNORMAL HIGH (ref 0.44–1.00)
GFR calc Af Amer: 41 mL/min — ABNORMAL LOW (ref >60)
GFR calc Af Amer: 49 mL/min — ABNORMAL LOW (ref >60)
GFR calc non Af Amer: 35 mL/min — ABNORMAL LOW (ref >60)
GFR calc non Af Amer: 42 mL/min — ABNORMAL LOW (ref >60)
Glucose, Bld: 256 mg/dL — ABNORMAL HIGH (ref 70–99)
Glucose, Bld: 166 mg/dL — ABNORMAL HIGH (ref 70–99)
Potassium: 3.2 mmol/L — ABNORMAL LOW (ref 3.5–5.1)
Potassium: 3.1 mmol/L — ABNORMAL LOW (ref 3.5–5.1)
Sodium: 133 mmol/L — ABNORMAL LOW (ref 135–145)
Sodium: 135 mmol/L (ref 135–145)

## 2019-02-01 LAB — CBC WITH DIFFERENTIAL/PLATELET
Abs Immature Granulocytes: 0.37 10*3/uL — ABNORMAL HIGH (ref 0.00–0.07)
Basophils Absolute: 0.1 10*3/uL (ref 0.0–0.1)
Basophils Relative: 0 %
Eosinophils Absolute: 0 10*3/uL (ref 0.0–0.5)
Eosinophils Relative: 0 %
HCT: 28.7 % — ABNORMAL LOW (ref 36.0–46.0)
Hemoglobin: 9.5 g/dL — ABNORMAL LOW (ref 12.0–15.0)
Immature Granulocytes: 1 %
Lymphocytes Relative: 3 %
Lymphs Abs: 1.2 10*3/uL (ref 0.7–4.0)
MCH: 27.9 pg (ref 26.0–34.0)
MCHC: 33.1 g/dL (ref 30.0–36.0)
MCV: 84.2 fL (ref 80.0–100.0)
Monocytes Absolute: 1.7 10*3/uL — ABNORMAL HIGH (ref 0.1–1.0)
Monocytes Relative: 5 %
Neutro Abs: 32.3 10*3/uL — ABNORMAL HIGH (ref 1.7–7.7)
Neutrophils Relative %: 91 %
Platelets: 461 10*3/uL — ABNORMAL HIGH (ref 150–400)
RBC: 3.41 MIL/uL — ABNORMAL LOW (ref 3.87–5.11)
RDW: 12.8 % (ref 11.5–15.5)
WBC: 35.6 10*3/uL — ABNORMAL HIGH (ref 4.0–10.5)
nRBC: 0 % (ref 0.0–0.2)

## 2019-02-01 LAB — URINALYSIS, ROUTINE W REFLEX MICROSCOPIC
Bilirubin Urine: NEGATIVE
Glucose, UA: >=500 mg/dL — AB
Ketones, ur: NEGATIVE mg/dL
Leukocytes,Ua: NEGATIVE
Nitrite: NEGATIVE
Protein, ur: >=300 mg/dL — AB
Specific Gravity, Urine: 1.022 (ref 1.005–1.030)
pH: 5 (ref 5.0–8.0)

## 2019-02-01 LAB — POCT I-STAT EG7
Acid-Base Excess: 6 mmol/L — ABNORMAL HIGH (ref 0.0–2.0)
Bicarbonate: 34.5 mmol/L — ABNORMAL HIGH (ref 20.0–28.0)
Calcium, Ion: 1.15 mmol/L (ref 1.15–1.40)
HCT: 33 % — ABNORMAL LOW (ref 36.0–46.0)
Hemoglobin: 11.2 g/dL — ABNORMAL LOW (ref 12.0–15.0)
O2 Saturation: 82 %
Patient temperature: 98.4
Potassium: 3.6 mmol/L (ref 3.5–5.1)
Sodium: 127 mmol/L — ABNORMAL LOW (ref 135–145)
TCO2: 37 mmol/L — ABNORMAL HIGH (ref 22–32)
pCO2, Ven: 71.6 mmHg (ref 44.0–60.0)
pH, Ven: 7.29 (ref 7.250–7.430)
pO2, Ven: 54 mmHg — ABNORMAL HIGH (ref 32.0–45.0)

## 2019-02-01 LAB — PROTIME-INR
INR: 1.2 (ref 0.8–1.2)
Prothrombin Time: 15.4 seconds — ABNORMAL HIGH (ref 11.4–15.2)

## 2019-02-01 LAB — PROCALCITONIN: Procalcitonin: 2.85 ng/mL

## 2019-02-01 LAB — CBG MONITORING, ED
Glucose-Capillary: 418 mg/dL — ABNORMAL HIGH (ref 70–99)
Glucose-Capillary: 330 mg/dL — ABNORMAL HIGH (ref 70–99)
Glucose-Capillary: 265 mg/dL — ABNORMAL HIGH (ref 70–99)
Glucose-Capillary: 228 mg/dL — ABNORMAL HIGH (ref 70–99)
Glucose-Capillary: 194 mg/dL — ABNORMAL HIGH (ref 70–99)
Glucose-Capillary: 170 mg/dL — ABNORMAL HIGH (ref 70–99)
Glucose-Capillary: 144 mg/dL — ABNORMAL HIGH (ref 70–99)

## 2019-02-01 LAB — GLUCOSE, CAPILLARY
Glucose-Capillary: 169 mg/dL — ABNORMAL HIGH (ref 70–99)
Glucose-Capillary: 247 mg/dL — ABNORMAL HIGH (ref 70–99)

## 2019-02-01 LAB — LACTIC ACID, PLASMA
Lactic Acid, Venous: 1.7 mmol/L (ref 0.5–1.9)
Lactic Acid, Venous: 2.1 mmol/L (ref 0.5–1.9)
Lactic Acid, Venous: 4.3 mmol/L (ref 0.5–1.9)
Lactic Acid, Venous: 3 mmol/L (ref 0.5–1.9)
Lactic Acid, Venous: 2.5 mmol/L (ref 0.5–1.9)
Lactic Acid, Venous: 1.6 mmol/L (ref 0.5–1.9)
Lactic Acid, Venous: 2.2 mmol/L (ref 0.5–1.9)

## 2019-02-01 LAB — SARS CORONAVIRUS 2 BY RT PCR (HOSPITAL ORDER, PERFORMED IN ~~LOC~~ HOSPITAL LAB): SARS Coronavirus 2: NEGATIVE

## 2019-02-01 LAB — I-STAT BETA HCG BLOOD, ED (MC, WL, AP ONLY): I-stat hCG, quantitative: <5 m[IU]/mL (ref <5)

## 2019-02-01 LAB — C-REACTIVE PROTEIN: CRP: 41.4 mg/dL — ABNORMAL HIGH (ref <1.0)

## 2019-02-01 MED ORDER — PROMETHAZINE HCL 25 MG/ML IJ SOLN
12.5000 mg | Freq: Four times a day (QID) | INTRAMUSCULAR | Status: DC | PRN
  Administered 2019-02-01 – 2019-02-02 (×2): 12.5 mg via INTRAVENOUS
  Filled 2019-02-01 (×2): qty 1

## 2019-02-01 MED ORDER — SODIUM CHLORIDE 0.9% FLUSH
3.0000 mL | Freq: Once | INTRAVENOUS | Status: AC
  Administered 2019-02-01: 3 mL via INTRAVENOUS

## 2019-02-01 MED ORDER — PROMETHAZINE HCL 25 MG/ML IJ SOLN
25.0000 mg | Freq: Once | INTRAMUSCULAR | Status: AC
  Administered 2019-02-01: 25 mg via INTRAVENOUS
  Filled 2019-02-01: qty 1

## 2019-02-01 MED ORDER — LIDOCAINE VISCOUS HCL 2 % MT SOLN
15.0000 mL | Freq: Once | OROMUCOSAL | Status: AC
  Administered 2019-02-01: 15 mL via ORAL
  Filled 2019-02-01: qty 15

## 2019-02-01 MED ORDER — SODIUM CHLORIDE 0.9 % IV SOLN
INTRAVENOUS | Status: DC
  Administered 2019-02-01 – 2019-02-03 (×4): via INTRAVENOUS

## 2019-02-01 MED ORDER — METRONIDAZOLE IN NACL 5-0.79 MG/ML-% IV SOLN
500.0000 mg | Freq: Once | INTRAVENOUS | Status: AC
  Administered 2019-02-01: 500 mg via INTRAVENOUS
  Filled 2019-02-01: qty 100

## 2019-02-01 MED ORDER — SODIUM CHLORIDE 0.9% FLUSH
3.0000 mL | Freq: Two times a day (BID) | INTRAVENOUS | Status: DC
  Administered 2019-02-01 – 2019-02-09 (×5): 3 mL via INTRAVENOUS

## 2019-02-01 MED ORDER — HEPARIN SODIUM (PORCINE) 5000 UNIT/ML IJ SOLN
5000.0000 [IU] | Freq: Three times a day (TID) | INTRAMUSCULAR | Status: DC
  Administered 2019-02-01 – 2019-02-09 (×24): 5000 [IU] via SUBCUTANEOUS
  Filled 2019-02-01 (×24): qty 1

## 2019-02-01 MED ORDER — INSULIN GLARGINE 100 UNIT/ML ~~LOC~~ SOLN
10.0000 [IU] | Freq: Every day | SUBCUTANEOUS | Status: DC
  Administered 2019-02-01 – 2019-02-02 (×2): 10 [IU] via SUBCUTANEOUS
  Filled 2019-02-01 (×2): qty 0.1

## 2019-02-01 MED ORDER — FENTANYL CITRATE (PF) 100 MCG/2ML IJ SOLN
50.0000 ug | Freq: Once | INTRAMUSCULAR | Status: AC
  Administered 2019-02-01: 50 ug via INTRAVENOUS
  Filled 2019-02-01: qty 2

## 2019-02-01 MED ORDER — ACETAMINOPHEN 650 MG RE SUPP: 650.0000 mg | Freq: Four times a day (QID) | RECTAL | Status: DC | PRN

## 2019-02-01 MED ORDER — DEXTROSE 50 % IV SOLN: 25.0000 mL | INTRAVENOUS | Status: DC | PRN

## 2019-02-01 MED ORDER — GADOBUTROL 1 MMOL/ML IV SOLN
10.0000 mL | Freq: Once | INTRAVENOUS | Status: AC | PRN
  Administered 2019-02-01: 10 mL via INTRAVENOUS

## 2019-02-01 MED ORDER — VANCOMYCIN HCL IN DEXTROSE 1-5 GM/200ML-% IV SOLN
1000.0000 mg | Freq: Once | INTRAVENOUS | Status: DC
  Filled 2019-02-01: qty 200

## 2019-02-01 MED ORDER — ONDANSETRON HCL 4 MG PO TABS
8.0000 mg | ORAL_TABLET | Freq: Once | ORAL | Status: AC
  Administered 2019-02-01: 8 mg via ORAL
  Filled 2019-02-01: qty 2

## 2019-02-01 MED ORDER — PROMETHAZINE HCL 12.5 MG RE SUPP: 12.5000 mg | Freq: Four times a day (QID) | RECTAL | Status: DC | PRN

## 2019-02-01 MED ORDER — INSULIN ASPART 100 UNIT/ML ~~LOC~~ SOLN
0.0000 [IU] | Freq: Three times a day (TID) | SUBCUTANEOUS | Status: DC
  Administered 2019-02-01: 4 [IU] via SUBCUTANEOUS
  Administered 2019-02-02: 15 [IU] via SUBCUTANEOUS
  Administered 2019-02-02: 7 [IU] via SUBCUTANEOUS
  Administered 2019-02-02 – 2019-02-03 (×3): 4 [IU] via SUBCUTANEOUS
  Administered 2019-02-04 – 2019-02-05 (×4): 3 [IU] via SUBCUTANEOUS
  Administered 2019-02-06: 4 [IU] via SUBCUTANEOUS
  Administered 2019-02-06: 3 [IU] via SUBCUTANEOUS
  Administered 2019-02-06 – 2019-02-08 (×6): 4 [IU] via SUBCUTANEOUS
  Administered 2019-02-08 – 2019-02-09 (×3): 7 [IU] via SUBCUTANEOUS

## 2019-02-01 MED ORDER — VANCOMYCIN HCL 10 G IV SOLR
2000.0000 mg | Freq: Once | INTRAVENOUS | Status: AC
  Administered 2019-02-01: 2000 mg via INTRAVENOUS
  Filled 2019-02-01: qty 2000

## 2019-02-01 MED ORDER — ONDANSETRON HCL 4 MG/2ML IJ SOLN
4.0000 mg | Freq: Four times a day (QID) | INTRAMUSCULAR | Status: DC | PRN
  Administered 2019-02-01: 4 mg via INTRAVENOUS
  Filled 2019-02-01: qty 2

## 2019-02-01 MED ORDER — SODIUM CHLORIDE 0.9 % IV SOLN
1.0000 g | Freq: Three times a day (TID) | INTRAVENOUS | Status: DC
  Administered 2019-02-01 – 2019-02-05 (×12): 1 g via INTRAVENOUS
  Filled 2019-02-01 (×14): qty 1

## 2019-02-01 MED ORDER — INSULIN REGULAR(HUMAN) IN NACL 100-0.9 UT/100ML-% IV SOLN
INTRAVENOUS | Status: DC
  Administered 2019-02-01: 4.4 [IU]/h via INTRAVENOUS
  Filled 2019-02-01: qty 100

## 2019-02-01 MED ORDER — SODIUM CHLORIDE 0.9 % IV SOLN
INTRAVENOUS | Status: DC
  Administered 2019-02-01: 03:00:00 via INTRAVENOUS

## 2019-02-01 MED ORDER — DEXTROSE-NACL 5-0.45 % IV SOLN
INTRAVENOUS | Status: DC
  Administered 2019-02-01: 08:00:00 via INTRAVENOUS

## 2019-02-01 MED ORDER — POLYETHYLENE GLYCOL 3350 17 G PO PACK: 17.0000 g | PACK | Freq: Every day | ORAL | Status: DC | PRN

## 2019-02-01 MED ORDER — INSULIN REGULAR BOLUS VIA INFUSION
0.0000 [IU] | Freq: Three times a day (TID) | INTRAVENOUS | Status: DC
  Filled 2019-02-01: qty 10

## 2019-02-01 MED ORDER — SODIUM CHLORIDE 0.9 % IV BOLUS
1000.0000 mL | Freq: Once | INTRAVENOUS | Status: AC
  Administered 2019-02-01: 1000 mL via INTRAVENOUS

## 2019-02-01 MED ORDER — ACETAMINOPHEN 325 MG PO TABS
650.0000 mg | ORAL_TABLET | Freq: Four times a day (QID) | ORAL | Status: DC | PRN
  Administered 2019-02-01 – 2019-02-04 (×3): 650 mg via ORAL
  Filled 2019-02-01 (×5): qty 2

## 2019-02-01 MED ORDER — INSULIN ASPART 100 UNIT/ML ~~LOC~~ SOLN
0.0000 [IU] | Freq: Every day | SUBCUTANEOUS | Status: DC
  Administered 2019-02-01: 2 [IU] via SUBCUTANEOUS

## 2019-02-01 MED ORDER — ONDANSETRON HCL 4 MG PO TABS: 4.0000 mg | ORAL_TABLET | Freq: Four times a day (QID) | ORAL | Status: DC | PRN

## 2019-02-01 MED ORDER — ONDANSETRON HCL 4 MG/2ML IJ SOLN
4.0000 mg | Freq: Once | INTRAMUSCULAR | Status: AC
  Administered 2019-02-01: 4 mg via INTRAVENOUS
  Filled 2019-02-01: qty 2

## 2019-02-01 MED ORDER — METOCLOPRAMIDE HCL 5 MG/ML IJ SOLN
5.0000 mg | Freq: Four times a day (QID) | INTRAMUSCULAR | Status: DC
  Administered 2019-02-01 – 2019-02-09 (×30): 5 mg via INTRAVENOUS
  Filled 2019-02-01 (×30): qty 2

## 2019-02-01 MED ORDER — SODIUM CHLORIDE 0.9 % IV SOLN
2.0000 g | Freq: Once | INTRAVENOUS | Status: AC
  Administered 2019-02-01: 2 g via INTRAVENOUS
  Filled 2019-02-01: qty 2

## 2019-02-01 MED ORDER — ALUM & MAG HYDROXIDE-SIMETH 200-200-20 MG/5ML PO SUSP
30.0000 mL | Freq: Once | ORAL | Status: AC
  Administered 2019-02-01: 30 mL via ORAL
  Filled 2019-02-01: qty 30

## 2019-02-01 MED ORDER — METRONIDAZOLE IN NACL 5-0.79 MG/ML-% IV SOLN
500.0000 mg | Freq: Three times a day (TID) | INTRAVENOUS | Status: DC
  Administered 2019-02-01 – 2019-02-05 (×12): 500 mg via INTRAVENOUS
  Filled 2019-02-01 (×12): qty 100

## 2019-02-01 MED ORDER — DOCUSATE SODIUM 100 MG PO CAPS
100.0000 mg | ORAL_CAPSULE | Freq: Two times a day (BID) | ORAL | Status: DC
  Administered 2019-02-01 – 2019-02-09 (×4): 100 mg via ORAL
  Filled 2019-02-01 (×13): qty 1

## 2019-02-01 MED ORDER — VANCOMYCIN HCL 10 G IV SOLR
1250.0000 mg | INTRAVENOUS | Status: DC
  Administered 2019-02-01 – 2019-02-02 (×2): 1250 mg via INTRAVENOUS
  Filled 2019-02-01 (×3): qty 1250

## 2019-02-01 NOTE — Progress Notes (Signed)
RN notified [facility] provider of patient's MEWS score 3.  Temperature 100.5, administered 650 mg Tylenol at 1948.  Pulse 119.  Blood pressure 157/87.  Awaiting response.

## 2019-02-01 NOTE — ED Notes (Signed)
Pt CBG 144

## 2019-02-01 NOTE — ED Notes (Signed)
Dr Wyvonnia Dusky informed of EG7 results

## 2019-02-01 NOTE — ED Provider Notes (Signed)
[facility] EMERGENCY DEPARTMENT Provider Note   CSN: [id number] Arrival date & time: 01/31/19  2325     History   Chief Complaint Chief Complaint  Patient presents with   Emesis    HPI Sheila Austin is a 33 y.o. female.     Patient with history of diabetes, not on medication, gastroparesis, morbid obesity, right foot wound status post debridement at the end of August presenting from home with nausea and vomiting over the past 4 days.  States she has not been able to keep anything down.  She is vomited up to 10 times a day for the past 4 days it is nonbloody nonbilious and nonbloody.  Developed fever 2 days ago up to 101.  No diarrhea.  States she has a wound to her right foot for which she saw her vascular surgeon 4 days ago and was given a course of Keflex which the pharmacy did not fill due to her penicillin allergy.  She reports worsening pain, drainage and foul-smelling discharge from the right foot over the past several days.  The wound has been draining pus and blood.  He comes in tonight with worsening pain in her foot as well as persistent vomiting.  States her sugars have been in the 200s at home and she has not been on any medication since she left the hospital AMA in August.  She denies any abdominal pain or back pain.  No pain with urination or blood in the urine.  Denies diarrhea or abdominal pain.  States she is not been checking her sugars regularly at home.  Fever to 101 at home yesterday.  Increased pain and swelling in her right leg compared to the left.  The history is provided by the patient.  Emesis Associated symptoms: arthralgias, fever and myalgias   Associated symptoms: no abdominal pain, no cough, no diarrhea and no headaches     Past Medical History:  Diagnosis Date   Diabetes mellitus    E. coli sepsis ([place])    Ectopic pregnancy    Essential hypertension, benign 11/01/2008   Fatty liver    Gastroparesis    GERD  (gastroesophageal reflux disease)    H. pylori infection    Helicobacter pylori gastritis 09/07/2011   Migraine    Morbid obesity with BMI of 50.0-59.9, adult ([place]) 10/05/2011   Pulmonary edema    Pyelonephritis    Ureteral obstruction     Patient Active Problem List   Diagnosis Date Noted   Diabetic foot infection ([place]) 12/31/2018   Obesity 12/31/2018   Facial cellulitis 07/02/2018   Class 3 severe obesity with body mass index (BMI) of 40.0 to 44.9 in adult ([place]) 07/09/2017   Abscess of right breast 02/24/2016   BMI 45.0-49.9, adult ([place]) 09/29/2015   Skin lesions - right lower extremity 12/07/2013   Morbid obesity with BMI of 50.0-59.9, adult ([place]) 10/05/2011   Obstructive sleep apnea 10/05/2011   Hypertriglyceridemia 09/27/2011   Migraine 09/07/2011   Essential hypertension, benign 11/01/2008   Gastroparesis 09/27/2008   IRREGULAR MENSES 08/24/2008   Diabetes mellitus out of control ([employer] [place]) 07/04/2006    Past Surgical History:  Procedure Laterality Date   ESOPHAGOGASTRODUODENOSCOPY  2010   WOUND DEBRIDEMENT Right 01/03/2019   Procedure: DEBRIDEMENT WOUND RIGHT LATERAL FOOT;  Surgeon: Angelia Mould, MD;  Location: [facility] OR;  Service: Vascular;  Laterality: Right;     OB History    Gravida  1   Para  Term      Preterm      AB  1   Living  0     SAB      TAB      Ectopic  1   Multiple      Live Births               Home Medications    Prior to Admission medications   Medication Sig Start Date End Date Taking? Authorizing Provider  blood glucose meter kit and supplies Dispense based on patient and insurance preference. Use up to four times daily as directed. (FOR ICD-10 E10.9, E11.9). 07/08/18   Johnson, Clanford L, MD  cephALEXin (KEFLEX) 500 MG capsule Take 1 capsule (500 mg total) by mouth 3 (three) times daily. 01/28/19   Angelia Mould, MD  escitalopram (LEXAPRO) 10 MG tablet Take 1 tablet (10 mg total) by  mouth daily. Patient not taking: Reported on 01/28/2019 09/10/18   Maximiano Coss, NP  Insulin Glargine (LANTUS SOLOSTAR) 100 UNIT/ML Solostar Pen Inject 40 Units into the skin daily. 07/08/18   Johnson, Clanford L, MD  insulin lispro (HUMALOG KWIKPEN) 100 UNIT/ML KwikPen Inject 0.08 mLs (8 Units total) into the skin 3 (three) times daily before meals. 07/08/18   Johnson, Clanford L, MD  Insulin Pen Needle 31G X 6 MM MISC 1 pen by Does not apply route as directed. 07/08/18   Johnson, Clanford L, MD  sulfamethoxazole-trimethoprim (BACTRIM) 400-80 MG tablet Take 1 tablet by mouth 2 (two) times daily. 12/29/18   Serafina Mitchell, MD    Family History Family History  Problem Relation Age of Onset   Arthritis Mother    Asthma Mother    COPD Mother    Depression Mother    Diabetes Mother    Hypertension Mother    Mental illness Mother    Kidney disease Mother    Crohn's disease Mother    Aneurysm Father 66       brain   Mental illness Sister    Arthritis Maternal Aunt    Asthma Maternal Aunt    COPD Maternal Aunt    Depression Maternal Aunt    Diabetes Maternal Aunt    Hypertension Maternal Aunt    Mental illness Maternal Aunt    Stroke Maternal Aunt    Heart failure Maternal Aunt    Heart failure Maternal Grandfather    Cancer Maternal Grandfather    Diabetes Maternal Grandfather    Heart disease Maternal Grandfather    Hypertension Maternal Grandfather    Hyperlipidemia Maternal Grandfather    Mental illness Brother    Diabetes Maternal Grandmother    Heart disease Maternal Grandmother    Hypertension Maternal Grandmother    Hyperlipidemia Maternal Grandmother    Diabetes Paternal Grandmother    Heart disease Paternal Grandmother    Diabetes Paternal Grandfather    Cancer Paternal Grandfather     Social History Social History   Tobacco Use   Smoking status: Never Smoker   Smokeless tobacco: Never Used  Substance Use Topics   Alcohol  use: Not Currently   Drug use: No     Allergies   Bee venom and Penicillins   Review of Systems Review of Systems  Constitutional: Positive for activity change, appetite change, fatigue and fever.  HENT: Negative for congestion and rhinorrhea.   Eyes: Negative for visual disturbance.  Respiratory: Negative for cough and shortness of breath.   Cardiovascular: Negative for chest pain.  Gastrointestinal: Positive  for nausea and vomiting. Negative for abdominal pain and diarrhea.  Genitourinary: Negative for dysuria and hematuria.  Musculoskeletal: Positive for arthralgias and myalgias.  Skin: Positive for wound.  Neurological: Positive for weakness. Negative for dizziness and headaches.   all other systems are negative except as noted in the HPI and PMH.     Physical Exam Updated Vital Signs BP (!) 130/96 (BP Location: Left Arm)    Pulse (!) 108    Temp 98.4 F (36.9 C) (Oral)    Resp 18    Wt 112.5 kg    SpO2 99%    BMI 45.36 kg/m   Physical Exam Vitals signs and nursing note reviewed.  Constitutional:      General: She is not in acute distress.    Appearance: She is well-developed. She is obese.  HENT:     Head: Normocephalic and atraumatic.     Mouth/Throat:     Mouth: Mucous membranes are dry.     Pharynx: No oropharyngeal exudate.  Eyes:     Conjunctiva/sclera: Conjunctivae normal.     Pupils: Pupils are equal, round, and reactive to light.  Neck:     Musculoskeletal: Normal range of motion and neck supple.     Comments: No meningismus. Cardiovascular:     Rate and Rhythm: Normal rate and regular rhythm.     Heart sounds: Normal heart sounds. No murmur.  Pulmonary:     Effort: Pulmonary effort is normal. No respiratory distress.     Breath sounds: Normal breath sounds.  Abdominal:     Palpations: Abdomen is soft.     Tenderness: There is no abdominal tenderness. There is no guarding or rebound.  Musculoskeletal: Normal range of motion.        General:  Swelling and tenderness present.     Right lower leg: Edema present.     Comments: Right leg is larger than the left.  There is diffuse erythema of the right foot with ulcerated wounds as depicted that probes to bone.  Foul-smelling drainage.  Intact DP and PT pulses.  Skin:    General: Skin is warm.     Capillary Refill: Capillary refill takes less than 2 seconds.     Findings: Lesion present.  Neurological:     General: No focal deficit present.     Mental Status: She is alert and oriented to person, place, and time. Mental status is at baseline.     Cranial Nerves: No cranial nerve deficit.     Motor: No abnormal muscle tone.     Coordination: Coordination normal.     Comments:  5/5 strength throughout. CN 2-12 intact.Equal grip strength.   Psychiatric:        Behavior: Behavior normal.        ED Treatments / Results  Labs (all labs ordered are listed, but only abnormal results are displayed) Labs Reviewed  LACTIC ACID, PLASMA - Abnormal; Notable for the following components:      Result Value   Lactic Acid, Venous 2.1 (*)    All other components within normal limits  COMPREHENSIVE METABOLIC PANEL - Abnormal; Notable for the following components:   Sodium 126 (*)    Chloride 81 (*)    Glucose, Bld 550 (*)    BUN 33 (*)    Creatinine, Ser 2.17 (*)    Calcium 8.7 (*)    Albumin 2.0 (*)    Alkaline Phosphatase 215 (*)    GFR calc non Af Amer 29 (*)  GFR calc Af Amer 34 (*)    All other components within normal limits  CBC WITH DIFFERENTIAL/PLATELET - Abnormal; Notable for the following components:   WBC 35.6 (*)    RBC 3.41 (*)    Hemoglobin 9.5 (*)    HCT 28.7 (*)    Platelets 461 (*)    Neutro Abs 32.3 (*)    Monocytes Absolute 1.7 (*)    Abs Immature Granulocytes 0.37 (*)    All other components within normal limits  URINALYSIS, ROUTINE W REFLEX MICROSCOPIC - Abnormal; Notable for the following components:   Color, Urine AMBER (*)    APPearance CLOUDY (*)     Glucose, UA >=500 (*)    Hgb urine dipstick MODERATE (*)    Protein, ur >=300 (*)    Bacteria, UA RARE (*)    All other components within normal limits  PROTIME-INR - Abnormal; Notable for the following components:   Prothrombin Time 15.4 (*)    All other components within normal limits  LACTIC ACID, PLASMA - Abnormal; Notable for the following components:   Lactic Acid, Venous 4.3 (*)    All other components within normal limits  CBG MONITORING, ED - Abnormal; Notable for the following components:   Glucose-Capillary 418 (*)    All other components within normal limits  POCT I-STAT EG7 - Abnormal; Notable for the following components:   pCO2, Ven 71.6 (*)    pO2, Ven 54.0 (*)    Bicarbonate 34.5 (*)    TCO2 37 (*)    Acid-Base Excess 6.0 (*)    Sodium 127 (*)    HCT 33.0 (*)    Hemoglobin 11.2 (*)    All other components within normal limits  CBG MONITORING, ED - Abnormal; Notable for the following components:   Glucose-Capillary 330 (*)    All other components within normal limits  CBG MONITORING, ED - Abnormal; Notable for the following components:   Glucose-Capillary 265 (*)    All other components within normal limits  SARS CORONAVIRUS 2 (HOSPITAL ORDER, [facility])  CULTURE, BLOOD (ROUTINE X 2)  CULTURE, BLOOD (ROUTINE X 2)  URINE CULTURE  LACTIC ACID, PLASMA  APTT  BASIC METABOLIC PANEL  BASIC METABOLIC PANEL  BASIC METABOLIC PANEL  LACTIC ACID, PLASMA  LACTIC ACID, PLASMA  I-STAT BETA HCG BLOOD, ED ([facility], [facility], [facility] ONLY)  I-STAT VENOUS BLOOD GAS, ED  I-STAT BETA HCG BLOOD, ED ([facility], [facility], [facility] ONLY)    EKG EKG Interpretation  Date/Time:  Sunday February 01 2019 02:35:34 EDT Ventricular Rate:  103 PR Interval:    QRS Duration: 90 QT Interval:  346 QTC Calculation: 453 R Axis:   70 Text Interpretation:  Sinus tachycardia Rate faster Confirmed by Ezequiel Essex [phone]) on 02/01/2019 2:48:55 AM   Radiology Ct Abdomen Pelvis Wo  Contrast  Result Date: 02/01/2019 CLINICAL DATA:  Initial evaluation for acute nausea, vomiting. EXAM: CT ABDOMEN AND PELVIS WITHOUT CONTRAST TECHNIQUE: Multidetector CT imaging of the abdomen and pelvis was performed following the standard protocol without IV contrast. COMPARISON:  Prior CT from 11/25/2014. FINDINGS: Lower chest: Visualized lung bases are clear. Hepatobiliary: Liver demonstrates a normal unenhanced appearance. Gallbladder within normal limits. No biliary dilatation. Pancreas: Pancreas within normal limits. Spleen: Spleen enlarged measuring 16 cm in AP diameter, increased from previous. Adrenals/Urinary Tract: Adrenal glands within normal limits. Kidneys equal in size without evidence for nephrolithiasis or hydronephrosis. No ureterolithiasis or hydroureter. Partially distended bladder within normal limits. Stomach/Bowel:  Stomach within normal limits. No evidence for bowel obstruction. Normal appendix. No acute inflammatory changes seen about the bowels. Vascular/Lymphatic: Intra-abdominal aorta of normal caliber. Mildly enlarged left periaortic lymph node measures 14 mm (series 3, image 42). Multiple additional scattered subcentimeter shotty retroperitoneal nodes noted. In the pelvis, there is an enlarged right common iliac node measuring 11 mm (series 3, image 67). Right external iliac nodes measure up to 16 mm (series 3, image 84). Right inguinal nodes measure up to 19 mm (series 3, image 103). Findings are of uncertain etiology, but new from previous. Reproductive: Uterus and ovaries within normal limits. Other: No free air or fluid. Musculoskeletal: Small focus of soft tissue density within the subcutaneous fat of the right anterior abdomen likely reflects an injection site. No acute osseous abnormality. No discrete lytic or blastic osseous lesions. IMPRESSION: 1. Interval development of splenomegaly with multiple scattered enlarged retroperitoneal, right iliac, and right inguinal lymph nodes  as above, indeterminate. While these findings may be reactive in nature, possible lymphoproliferative disorder could also have this appearance. Correlation with laboratory values with possible histologic sampling suggested for further evaluation. 2. No other acute intra-abdominal or pelvic process. Electronically Signed   By: Jeannine Boga M.D.   On: 02/01/2019 05:06   Dg Chest 1 View  Result Date: 02/01/2019 CLINICAL DATA:  Initial evaluation for wound to right foot, history of recent surgery. EXAM: CHEST  1 VIEW COMPARISON:  Prior radiograph from 06/26/2017. FINDINGS: The cardiac and mediastinal silhouettes are stable in size and contour, and remain within normal limits. The lungs are normally inflated. No airspace consolidation, pleural effusion, or pulmonary edema. No pneumothorax. No acute osseous abnormality. IMPRESSION: No active disease. Electronically Signed   By: Jeannine Boga M.D.   On: 02/01/2019 02:24   Dg Ankle Complete Right  Result Date: 02/01/2019 CLINICAL DATA:  Wound lateral right foot. Ulcer. History of recent foot surgery. EXAM: RIGHT ANKLE - COMPLETE 3+ VIEW COMPARISON:  None. FINDINGS: Soft tissue defect about the lateral aspect of the fifth metatarsal, better assessed on concurrent foot exam. Generalized soft tissue edema about the ankle, possible patchy soft tissue air laterally. Periosteal thickening of the proximal fifth metatarsal better assessed on foot radiograph. No bony destructive change about the ankle. The ankle mortise is preserved. No fracture or dislocation. IMPRESSION: 1. Soft tissue defect about the lateral aspect of the fifth metatarsal, better assessed on concurrent foot exam. 2. Generalized soft tissue about the ankle with patchy air in the soft tissues laterally, may be related to recent surgery. Electronically Signed   By: Keith Rake M.D.   On: 02/01/2019 02:46   Dg Foot Complete Right  Result Date: 02/01/2019 CLINICAL DATA:  Wound lateral  right foot. Ulcer. Recent foot surgery. EXAM: RIGHT FOOT COMPLETE - 3+ VIEW COMPARISON:  None. FINDINGS: Soft tissue defect about the lateral foot adjacent to the proximal fifth metatarsal. There is periosteal reaction at the fifth metatarsal base. No other bony destructive or periosteal change. No acute fracture. Diffuse generalized soft tissue edema about the foot and ankle. IMPRESSION: 1. Soft tissue defect about the lateral foot with subjacent periosteal reaction at the base of the fifth metatarsal, suspicious for osteomyelitis. 2. Diffuse soft tissue edema about the foot and ankle. Electronically Signed   By: Keith Rake M.D.   On: 02/01/2019 02:47    Procedures Procedures (including critical care time)  Medications Ordered in ED Medications  sodium chloride flush (NS) 0.9 % injection 3 mL (has no administration  in time range)  aztreonam (AZACTAM) 2 g in sodium chloride 0.9 % 100 mL IVPB (has no administration in time range)  metroNIDAZOLE (FLAGYL) IVPB 500 mg (has no administration in time range)  ondansetron (ZOFRAN) injection 4 mg (has no administration in time range)  alum & mag hydroxide-simeth (MAALOX/MYLANTA) 200-200-20 MG/5ML suspension 30 mL (has no administration in time range)    And  lidocaine (XYLOCAINE) 2 % viscous mouth solution 15 mL (has no administration in time range)  dextrose 5 %-0.45 % sodium chloride infusion (has no administration in time range)  insulin regular, human (MYXREDLIN) 100 units/ 100 mL infusion (has no administration in time range)  dextrose 50 % solution 25 mL (has no administration in time range)  0.9 %  sodium chloride infusion (has no administration in time range)  vancomycin (VANCOCIN) 2,000 mg in sodium chloride 0.9 % 500 mL IVPB (has no administration in time range)  ondansetron (ZOFRAN) tablet 8 mg (8 mg Oral Given 02/01/19 0159)  sodium chloride 0.9 % bolus 1,000 mL (1,000 mLs Intravenous New Bag/Given 02/01/19 0155)     Initial  Impression / Assessment and Plan / ED Course  I have reviewed the triage vital signs and the nursing notes.  Pertinent labs & imaging results that were available during my care of the patient were reviewed by me and considered in my medical decision making (see chart for details).      Patient with history of diabetes and chronic foot wound presenting with nausea and vomiting as well as increased drainage from her foot for the past several days.  Her vitals are stable. Fever at home.   Abdomen is soft and nontender.  She does appear dry is given IV fluids and antibiotics.  Code sepsis activated.  Her wound is depicted as above.  Labs show hyperglycemia without anion gap.  Leukocytosis noted. Hyponatremic.  CT abdomen obtained given her persistent nausea and vomiting with profound leukocytosis.  CT shows no acute process with but does show splenomegaly with lymphadenopathy diffusely of uncertain etiology.  Discussed with patient.  Blood pressure and mental status remained stable throughout ED course.  Her second lactate has up trended from 2 to 4.  She is given additional IV fluids.  Continued IV insulin, IV fluids and IV antibiotics.  She is informed of her CT scan results and need for further evaluation regarding this.  Admission to the hospital for sepsis with AKI, intractable nausea and vomiting, hyperglycemia and diabetic wound infection discussed with Dr. Myna Hidalgo. Dr. Donnetta Hutching of vascular surgery made aware of admission as well. No evidence of acute critical limb ischemia.   CRITICAL CARE Performed by: Ezequiel Essex Total critical care time: 60 minutes Critical care time was exclusive of separately billable procedures and treating other patients. Critical care was necessary to treat or prevent imminent or life-threatening deterioration. Critical care was time spent personally by me on the following activities: development of treatment plan with patient and/or surrogate as well as  nursing, discussions with consultants, evaluation of patient's response to treatment, examination of patient, obtaining history from patient or surrogate, ordering and performing treatments and interventions, ordering and review of laboratory studies, ordering and review of radiographic studies, pulse oximetry and re-evaluation of patient's condition.   Sheila Austin was evaluated in Emergency Department on 02/01/2019 for the symptoms described in the history of present illness. She was evaluated in the context of the global COVID-19 pandemic, which necessitated consideration that the patient might be at risk for  infection with the SARS-CoV-2 virus that causes COVID-19. Institutional protocols and algorithms that pertain to the evaluation of patients at risk for COVID-19 are in a state of rapid change based on information released by regulatory bodies including the CDC and federal and state organizations. These policies and algorithms were followed during the patient's care in the ED.   Final Clinical Impressions(s) / ED Diagnoses   Final diagnoses:  Sepsis with acute organ dysfunction without septic shock, due to unspecified organism, unspecified type ([place])  Skin ulcer of right foot with necrosis of muscle ([facility])  Intractable vomiting with nausea, unspecified vomiting type  Hyperglycemia    ED Discharge Orders    None       Ezequiel Essex, MD 02/01/19 [phone]

## 2019-02-01 NOTE — ED Notes (Signed)
Pt BCG 170

## 2019-02-01 NOTE — ED Notes (Signed)
Patient transported to CT 

## 2019-02-01 NOTE — ED Notes (Signed)
CBG not reading barcode on pt bracelet. CBG 500, override pt in glucometor. Verified by Eugenia Pancoast., EMT

## 2019-02-01 NOTE — Consult Note (Addendum)
Hospital Consult    Reason for Consult:  Right foot wound Requesting Physician:  Rancour MRN #:  [id number]  History of Present Illness: This is a 33 y.o. female pt of Dr. Scot Dock who was originally seen by Dr. Trula Slade on 12/29/2018 for a right foot diabetic wound infection.  She did have adequate circulation.  She was admitted to the hospital for control of her diabetes and IV abx.  On 01/03/2019, she underwent debridement of the wound on the lateral aspect of her right foot.  She saw Dr. Scot Dock back on 9/23.  She was concerned that there was some odor to her wound and was not on abx.  Dr. Scot Dock prescribed her Keflex and recommended changing her dressing changes to bid.    I spoke with the pt today and she states that she never got the Keflex filled b/c the pharmacy would not fill it due to her PCN allergy.  She states that Cipro was called in, but she did not pick this up bc she did not know.  She did change her dressing changes to bid with mesh gauze.  She presents today with nausea and vomiting for 4 days.  She did have a fever of 101 a couple of days ago.  She states that the pain has gotten worse and there continues to be a smell from the wound so she came to the ER.  She has leukocytosis of 36k.  Vascular surgery is consulted.     Past Medical History:  Diagnosis Date  . Diabetes mellitus    uncontrolled  . E. coli sepsis ([place])   . Ectopic pregnancy   . Essential hypertension, benign 11/01/2008  . Fatty liver   . Gastroparesis   . GERD (gastroesophageal reflux disease)   . Helicobacter pylori gastritis 09/07/2011  . Migraine   . Morbid obesity with BMI of 50.0-59.9, adult ([place]) 10/05/2011  . Pulmonary edema   . Pyelonephritis   . Ureteral obstruction     Past Surgical History:  Procedure Laterality Date  . ESOPHAGOGASTRODUODENOSCOPY  2010  . WOUND DEBRIDEMENT Right 01/03/2019   Procedure: DEBRIDEMENT WOUND RIGHT LATERAL FOOT;  Surgeon: Angelia Mould, MD;  Location: [place];  Service: Vascular;  Laterality: Right;    Allergies  Allergen Reactions  . Bee Venom Anaphylaxis, Hives, Shortness Of Breath and Swelling  . Penicillins Anaphylaxis    Has patient had a PCN reaction causing immediate rash, facial/tongue/throat swelling, SOB or lightheadedness with hypotension: yes Has patient had a PCN reaction causing severe rash involving mucus membranes or skin necrosis: unknown Has patient had a PCN reaction that required hospitalization : yes Has patient had a PCN reaction occurring within the last 10 years: no If all of the above answers are "NO", then may proceed with Cephalosporin use.     Prior to Admission medications   Medication Sig Start Date End Date Taking? Authorizing Provider  blood glucose meter kit and supplies Dispense based on patient and insurance preference. Use up to four times daily as directed. (FOR ICD-10 E10.9, E11.9). Patient not taking: Reported on 02/01/2019 07/08/18   Murlean Iba, MD  cephALEXin (KEFLEX) 500 MG capsule Take 1 capsule (500 mg total) by mouth 3 (three) times daily. Patient not taking: Reported on 02/01/2019 01/28/19   Angelia Mould, MD  escitalopram (LEXAPRO) 10 MG tablet Take 1 tablet (10 mg total) by mouth daily. Patient not taking: Reported on 01/28/2019 09/10/18   Maximiano Coss, NP  Insulin Glargine (LANTUS  SOLOSTAR) 100 UNIT/ML Solostar Pen Inject 40 Units into the skin daily. Patient not taking: Reported on 02/01/2019 07/08/18   Irwin Brakeman L, MD  insulin lispro (HUMALOG KWIKPEN) 100 UNIT/ML KwikPen Inject 0.08 mLs (8 Units total) into the skin 3 (three) times daily before meals. Patient not taking: Reported on 02/01/2019 07/08/18   Irwin Brakeman L, MD  Insulin Pen Needle 31G X 6 MM MISC 1 pen by Does not apply route as directed. Patient not taking: Reported on 02/01/2019 07/08/18   Murlean Iba, MD  sulfamethoxazole-trimethoprim (BACTRIM) 400-80 MG tablet Take 1 tablet by mouth 2 (two) times  daily. Patient not taking: Reported on 02/01/2019 12/29/18   Serafina Mitchell, MD    Social History   Socioeconomic History  . Marital status: Divorced    Spouse name: Roderic Palau  . Number of children: 0  . Years of education: 24  . Highest education level: Not on file  Occupational History  . Occupation: Biomedical scientist: [employer]  . Financial resource strain: Somewhat hard  . Food insecurity    Worry: Sometimes true    Inability: Sometimes true  . Transportation needs    Medical: No    Non-medical: No  Tobacco Use  . Smoking status: Never Smoker  . Smokeless tobacco: Never Used  Substance and Sexual Activity  . Alcohol use: Not Currently  . Drug use: No  . Sexual activity: Yes    Partners: Male    Birth control/protection: Condom    Comment: Pt is interested in taking BCP  Lifestyle  . Physical activity    Days per week: 0 days    Minutes per session: 0 min  . Stress: To some extent  Relationships  . Social Herbalist on phone: Three times a week    Gets together: Three times a week    Attends religious service: Never    Active member of club or organization: No    Attends meetings of clubs or organizations: Never    Relationship status: Divorced  . Intimate partner violence    Fear of current or ex partner: No    Emotionally abused: No    Physically abused: No    Forced sexual activity: No  Other Topics Concern  . Not on file  Social History Narrative   The patient is single, separated from her husband 01/2015.   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 Arthritis Mother   . Asthma Mother   . COPD Mother   . Depression Mother   . Diabetes Mother   . Hypertension Mother   . Mental illness Mother   . Kidney disease Mother   . Crohn's disease Mother   . Aneurysm Father 56       brain  .  Mental illness Sister   . Arthritis Maternal Aunt   . Asthma Maternal Aunt   . COPD Maternal Aunt   . Depression Maternal Aunt   . Diabetes Maternal Aunt   . Hypertension Maternal Aunt   . Mental illness Maternal Aunt   . Stroke Maternal Aunt   . Heart failure Maternal Aunt   . Heart failure Maternal Grandfather   . Cancer Maternal Grandfather   . Diabetes Maternal Grandfather   . Heart  disease Maternal Grandfather   . Hypertension Maternal Grandfather   . Hyperlipidemia Maternal Grandfather   . Mental illness Brother   . Diabetes Maternal Grandmother   . Heart disease Maternal Grandmother   . Hypertension Maternal Grandmother   . Hyperlipidemia Maternal Grandmother   . Diabetes Paternal Grandmother   . Heart disease Paternal Grandmother   . Diabetes Paternal Grandfather   . Cancer Paternal Grandfather     ROS: [x]  Positive   [ ]  Negative   [ ]  All sytems reviewed and are negative See HPI for details  Physical Examination  Vitals:   02/01/19 0900 02/01/19 0945  BP: 136/87 (!) 150/87  Pulse: 97 99  Resp: (!) 25 (!) 25  Temp:    SpO2: 96% 97%   Body mass index is 45.36 kg/m.  General:  Appears to feel unwell Vascular:   Palpable right DP pusle.  There is some edema in the RLE.  The wound on the lateral aspect of the foot is malodorous and appears to have some mild purulence to it.       CBC    Component Value Date/Time   WBC 35.6 (H) 02/01/2019 0033   RBC 3.41 (L) 02/01/2019 0033   HGB 11.2 (L) 02/01/2019 0156   HCT 33.0 (L) 02/01/2019 0156   PLT 461 (H) 02/01/2019 0033   MCV 84.2 02/01/2019 0033   MCV 85.4 06/26/2017 1446   MCH 27.9 02/01/2019 0033   MCHC 33.1 02/01/2019 0033   RDW 12.8 02/01/2019 0033   LYMPHSABS 1.2 02/01/2019 0033   MONOABS 1.7 (H) 02/01/2019 0033   EOSABS 0.0 02/01/2019 0033   BASOSABS 0.1 02/01/2019 0033    BMET    Component Value Date/Time   NA 133 (L) 02/01/2019 0655   K 3.2 (L) 02/01/2019 0655   CL 92 (L) 02/01/2019 0655    CO2 28 02/01/2019 0655   GLUCOSE 256 (H) 02/01/2019 0655   BUN 32 (H) 02/01/2019 0655   CREATININE 1.88 (H) 02/01/2019 0655   CREATININE 0.64 09/26/2015 1616   CALCIUM 8.2 (L) 02/01/2019 0655   GFRNONAA 35 (L) 02/01/2019 0655   GFRNONAA >89 09/26/2015 1616   GFRAA 41 (L) 02/01/2019 0655   GFRAA >89 09/26/2015 1616    COAGS: Lab Results  Component Value Date   INR 1.2 02/01/2019   INR 0.90 06/05/2012   INR 1.12 10/02/2011     Non-Invasive Vascular Imaging:   CT abd/pelvis 02/01/2019: IMPRESSION: 1. Interval development of splenomegaly with multiple scattered enlarged retroperitoneal, right iliac, and right inguinal lymph nodes as above, indeterminate. While these findings may be reactive in nature, possible lymphoproliferative disorder could also have this appearance. Correlation with laboratory values with possible histologic sampling suggested for further evaluation. 2. No other acute intra-abdominal or pelvic process  3 view foot x-ray 02/01/2019: IMPRESSION: 1. Soft tissue defect about the lateral foot with subjacent periosteal reaction at the base of the fifth metatarsal, suspicious for osteomyelitis. 2. Diffuse soft tissue edema about the foot and ankle.   ASSESSMENT/PLAN: This is a 33 y.o. female with diabetic foot infection (right) who recently underwent debridement by Dr. Scot Dock.  -pt was seen in the office earlier this week and was prescribed Keflex, but this was not given as the pharmacy would not fill it due to her PCN allergy.  She was subsequently called in Cipro, but this was not picked up as the pt states she did not know.  She presented to the hospital with 4 days of N/V  and hx of fever (covid negative).  She is being admitted for broad spectrum abx for sepsis, wound right foot, hyperglycemia. -pt does have palpable DP on the right.  I have asked the ER RN to get a wound cx and start the wet to dry dressings back once Dr. Donnetta Hutching has evaluated the pt.  She  does have a suspicion for osteomyelitis on her plain xray.  May need consult with Dr. Sharol Given.    Leontine Locket, PA-C [facility] [phone]   I have examined the patient, reviewed and agree with above.  The patient is well-known to me.  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  I have reviewed her chart and examined her foot.  She is quite uncomfortable currently with significant nausea and vomiting.  She does have erythema throughout her foot.  Does have open wound on the left lateral aspect.  She has an easily palpable dorsalis pedis pulse bilaterally  No evidence of arterial insufficiency.  Does probably have ongoing difficulty with soft tissue infection and potentially bone as well.  Has not been on the antibiotics due to confusion regarding Keflex with potential penicillin allergy.  Agree with admission for IV antibiotics local wound care.  Curt Jews, MD 02/01/2019 11:57 AM

## 2019-02-01 NOTE — ED Notes (Signed)
bladder scan showed 421 ml

## 2019-02-01 NOTE — ED Notes (Signed)
Pt CBG 194

## 2019-02-01 NOTE — Progress Notes (Signed)
CSW received consult for access to medications. CSW reviewed case and noted it was barriers due to allergy and not being aware of different medication being sent to pharmacy. CSW reviewed consult, notes admission, and will continue to follow for DME/Home Health needs or other discharge supports.

## 2019-02-01 NOTE — ED Notes (Signed)
Patient transported to MRI 

## 2019-02-01 NOTE — ED Notes (Signed)
Pt does admit that she was unable to get on abx OP, pharmacy changed from keflex to cipro d/t allergies and she was not notified to pick up rx.  PA aware.

## 2019-02-01 NOTE — ED Notes (Signed)
Msg sent to admitting MD regarding recurrent emesis.  Dr. Lorin Mercy to come eval.  Pt currently appears sleeping at this time.

## 2019-02-01 NOTE — ED Notes (Signed)
Continued emesis at this time.  Reviewed POC with patient.  Dr. Mee Hives PA in with pt to evaluate wound.  Will order culture at this time.

## 2019-02-01 NOTE — H&P (Signed)
History and Physical    Sheila Austin QKM:[id number] DOB: [birth year] DOA: 01/31/2019  PCP: Maximiano Coss, NP Consultants:  Scot Dock - vascular Patient coming from:  Home - lives with fiance; [name], [place]  Chief Complaint: n/v  HPI: Sheila Austin is a 33 y.o. female with medical history significant of morbid obesity (BMI 45); uncontrolled DM; h/o E coli sepsis; and HTN presenting with diabetic foot infection.  Her initial visit about this issue was 8/24, and she was admitted to the hospital 8/26-29.  She had debridement on 8/29 and then left the hospital AMA.  She was last seen for this issue by Dr. Scot Dock on 9/23.  She had good arterial dopplers and was given Keflex.  She came in today because of "very harsh nausea and vomiting" x 4 days.  +fever yesterday intermittently, up to 99.9.  She has been unable to stop vomiting despite taking medication from her fiance's mother, [name], antacids.  She is still having nausea despite Zofran.  She has no energy.  She has a wound on her foot - it started in July as a blister.  She got in a river and it busted open.  She saw Dr. Scot Dock and she had debridement and she left AMA due to child care issues.  "It's been oookaaay - I got worried one time" and she had ABIs on a visit with Dr. Trula Slade and was encouraged to use better shoes.  Wednesday, she saw Dr. Scot Dock due to an odor "and he wasn't too concerned about it."  She was given Keflex and "my pharmacy wouldn't fill it" due to PCN allergy.  They called in Cipro but she was unable to hold anything down and so didn't pick it up.  ED Course:  Carryover, per Dr. Myna Hidalgo:  25 yof with uncontrolled IDDM and diabetic foot infection status-post debridement a month ago, not taking any medications since she left the hospital AMA in August, now presenting with N/V, fevers, and malodorous right foot wound.   ED workup with marked leukocytosis, elevated lactate, worsened renal insufficiency,  stable anemia, glucose 550 with normal bicarb and no ketonuria, plain films of right foot concerning for osteomyelitis, and CT abd/pelvis with splenomegaly and LAD (reactive vs lymphoproliferative disorder).   She was cultured, given 2 liters of NS, started on broad-spectrum abx, and insuilin infusion. Lactate increased further and she is getting a third liter NS. BP has been stable. COVID-19 is negative.   Review of Systems: As per HPI; otherwise review of systems reviewed and negative.   Ambulatory Status:  Ambulates without assistance  Past Medical History:  Diagnosis Date  . Diabetes mellitus    uncontrolled, last A1c was 14  . E. coli sepsis ([place])   . Ectopic pregnancy   . Essential hypertension, benign 11/01/2008  . Fatty liver   . Gastroparesis   . GERD (gastroesophageal reflux disease)   . Helicobacter pylori gastritis 09/07/2011  . Migraine   . Morbid obesity with body mass index (BMI) of 40.0 to 49.9 ([place])   . Pulmonary edema   . Pyelonephritis   . Ureteral obstruction     Past Surgical History:  Procedure Laterality Date  . ESOPHAGOGASTRODUODENOSCOPY  2010  . WOUND DEBRIDEMENT Right 01/03/2019   Procedure: DEBRIDEMENT WOUND RIGHT LATERAL FOOT;  Surgeon: Angelia Mould, MD;  Location: [facility] OR;  Service: Vascular;  Laterality: Right;    Social History   Socioeconomic History  . Marital status: Divorced    Spouse name:  Roderic Palau  . Number of children: 0  . Years of education: 41  . Highest education level: Not on file  Occupational History  . Occupation: Biomedical scientist: [employer]  . Financial resource strain: Somewhat hard  . Food insecurity    Worry: Sometimes true    Inability: Sometimes true  . Transportation needs    Medical: No    Non-medical: No  Tobacco Use  . Smoking status: Never Smoker  . Smokeless tobacco: Never Used  Substance and Sexual Activity  . Alcohol use: Not Currently  . Drug use: No  . Sexual  activity: Yes    Partners: Male    Birth control/protection: Condom    Comment: Pt is interested in taking BCP  Lifestyle  . Physical activity    Days per week: 0 days    Minutes per session: 0 min  . Stress: To some extent  Relationships  . Social Herbalist on phone: Three times a week    Gets together: Three times a week    Attends religious service: Never    Active member of club or organization: No    Attends meetings of clubs or organizations: Never    Relationship status: Divorced  . Intimate partner violence    Fear of current or ex partner: No    Emotionally abused: No    Physically abused: No    Forced sexual activity: No  Other Topics Concern  . Not on file  Social History Narrative   The patient is single, separated from her husband 01/2015.   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 Bee Venom Anaphylaxis, Hives, Shortness Of Breath and Swelling  . Penicillins Anaphylaxis    Has patient had a PCN reaction causing immediate rash, facial/tongue/throat swelling, SOB or lightheadedness with hypotension: yes Has patient had a PCN reaction causing severe rash involving mucus membranes or skin necrosis: unknown Has patient had a PCN reaction that required hospitalization : yes Has patient had a PCN reaction occurring within the last 10 years: no If all of the above answers are "NO", then may proceed with Cephalosporin use.     Family History  Problem Relation Age of Onset  . Arthritis Mother   . Asthma Mother   . COPD Mother   . Depression Mother   . Diabetes Mother   . Hypertension Mother   . Mental illness Mother   . Kidney disease Mother   . Crohn's disease Mother   . Aneurysm Father 17       brain  . Mental illness Sister   . Arthritis Maternal Aunt   . Asthma Maternal Aunt   . COPD Maternal Aunt   . Depression Maternal Aunt   . Diabetes  Maternal Aunt   . Hypertension Maternal Aunt   . Mental illness Maternal Aunt   . Stroke Maternal Aunt   . Heart failure Maternal Aunt   . Heart failure Maternal Grandfather   . Cancer Maternal Grandfather   . Diabetes Maternal Grandfather   . Heart disease Maternal Grandfather   . Hypertension Maternal Grandfather   . Hyperlipidemia Maternal Grandfather   . Mental illness Brother   . Diabetes Maternal Grandmother   . Heart disease Maternal Grandmother   . Hypertension Maternal Grandmother   .  Hyperlipidemia Maternal Grandmother   . Diabetes Paternal Grandmother   . Heart disease Paternal Grandmother   . Diabetes Paternal Grandfather   . Cancer Paternal Grandfather     Prior to Admission medications   Medication Sig Start Date End Date Taking? Authorizing Provider  blood glucose meter kit and supplies Dispense based on patient and insurance preference. Use up to four times daily as directed. (FOR ICD-10 E10.9, E11.9). Patient not taking: Reported on 02/01/2019 07/08/18   Murlean Iba, MD  cephALEXin (KEFLEX) 500 MG capsule Take 1 capsule (500 mg total) by mouth 3 (three) times daily. Patient not taking: Reported on 02/01/2019 01/28/19   Angelia Mould, MD  escitalopram (LEXAPRO) 10 MG tablet Take 1 tablet (10 mg total) by mouth daily. Patient not taking: Reported on 01/28/2019 09/10/18   Maximiano Coss, NP  Insulin Glargine (LANTUS SOLOSTAR) 100 UNIT/ML Solostar Pen Inject 40 Units into the skin daily. Patient not taking: Reported on 02/01/2019 07/08/18   Irwin Brakeman L, MD  insulin lispro (HUMALOG KWIKPEN) 100 UNIT/ML KwikPen Inject 0.08 mLs (8 Units total) into the skin 3 (three) times daily before meals. Patient not taking: Reported on 02/01/2019 07/08/18   Irwin Brakeman L, MD  Insulin Pen Needle 31G X 6 MM MISC 1 pen by Does not apply route as directed. Patient not taking: Reported on 02/01/2019 07/08/18   Murlean Iba, MD  sulfamethoxazole-trimethoprim  (BACTRIM) 400-80 MG tablet Take 1 tablet by mouth 2 (two) times daily. Patient not taking: Reported on 02/01/2019 12/29/18   Serafina Mitchell, MD    Physical Exam: Vitals:   02/01/19 1236 02/01/19 1444 02/01/19 1551 02/01/19 1706  BP: (!) 155/96 (!) 174/99 (!) 173/98 (!) 163/94  Pulse: (!) 106 (!) 109 (!) 119 (!) 115  Resp: 18 16 18    Temp:   100.2 F (37.9 C) 99.4 F (37.4 C)  TempSrc:   Oral Oral  SpO2: 100% 97% 99% 98%  Weight:         . General:  Appears calm and comfortable and is NAD; she is emotionally labile . Eyes:  PERRL, EOMI, normal lids, iris . ENT:  grossly normal hearing, lips & tongue, mmm; very poor dentition . Neck:  no LAD, masses or thyromegalys . Cardiovascular:  RR with minimal tachycardia, no m/r/g.  . Respiratory:   CTA bilaterally with no wheezes/rales/rhonchi.  Normal respiratory effort. . Abdomen:  soft, NT, ND, NABS . Back:   normal alignment, no CVAT . Skin:  Marked RLE edema and changes c/w stasis; large R lateral foot wound with foul-smelling discharge         . Musculoskeletal:  grossly normal tone BUE/BLE, good ROM, as above . Psychiatric: flat and emotionally labile mood and affect, speech fluent and appropriate, AOx3 . Neurologic:  CN 2-12 grossly intact, moves all extremities in coordinated fashion    Radiological Exams on Admission: Ct Abdomen Pelvis Wo Contrast  Result Date: 02/01/2019 CLINICAL DATA:  Initial evaluation for acute nausea, vomiting. EXAM: CT ABDOMEN AND PELVIS WITHOUT CONTRAST TECHNIQUE: Multidetector CT imaging of the abdomen and pelvis was performed following the standard protocol without IV contrast. COMPARISON:  Prior CT from 11/25/2014. FINDINGS: Lower chest: Visualized lung bases are clear. Hepatobiliary: Liver demonstrates a normal unenhanced appearance. Gallbladder within normal limits. No biliary dilatation. Pancreas: Pancreas within normal limits. Spleen: Spleen enlarged measuring 16 cm in AP diameter, increased  from previous. Adrenals/Urinary Tract: Adrenal glands within normal limits. Kidneys equal in size without evidence for  nephrolithiasis or hydronephrosis. No ureterolithiasis or hydroureter. Partially distended bladder within normal limits. Stomach/Bowel: Stomach within normal limits. No evidence for bowel obstruction. Normal appendix. No acute inflammatory changes seen about the bowels. Vascular/Lymphatic: Intra-abdominal aorta of normal caliber. Mildly enlarged left periaortic lymph node measures 14 mm (series 3, image 42). Multiple additional scattered subcentimeter shotty retroperitoneal nodes noted. In the pelvis, there is an enlarged right common iliac node measuring 11 mm (series 3, image 67). Right external iliac nodes measure up to 16 mm (series 3, image 84). Right inguinal nodes measure up to 19 mm (series 3, image 103). Findings are of uncertain etiology, but new from previous. Reproductive: Uterus and ovaries within normal limits. Other: No free air or fluid. Musculoskeletal: Small focus of soft tissue density within the subcutaneous fat of the right anterior abdomen likely reflects an injection site. No acute osseous abnormality. No discrete lytic or blastic osseous lesions. IMPRESSION: 1. Interval development of splenomegaly with multiple scattered enlarged retroperitoneal, right iliac, and right inguinal lymph nodes as above, indeterminate. While these findings may be reactive in nature, possible lymphoproliferative disorder could also have this appearance. Correlation with laboratory values with possible histologic sampling suggested for further evaluation. 2. No other acute intra-abdominal or pelvic process. Electronically Signed   By: Jeannine Boga M.D.   On: 02/01/2019 05:06   Dg Chest 1 View  Result Date: 02/01/2019 CLINICAL DATA:  Initial evaluation for wound to right foot, history of recent surgery. EXAM: CHEST  1 VIEW COMPARISON:  Prior radiograph from 06/26/2017. FINDINGS: The  cardiac and mediastinal silhouettes are stable in size and contour, and remain within normal limits. The lungs are normally inflated. No airspace consolidation, pleural effusion, or pulmonary edema. No pneumothorax. No acute osseous abnormality. IMPRESSION: No active disease. Electronically Signed   By: Jeannine Boga M.D.   On: 02/01/2019 02:24   Dg Ankle Complete Right  Result Date: 02/01/2019 CLINICAL DATA:  Wound lateral right foot. Ulcer. History of recent foot surgery. EXAM: RIGHT ANKLE - COMPLETE 3+ VIEW COMPARISON:  None. FINDINGS: Soft tissue defect about the lateral aspect of the fifth metatarsal, better assessed on concurrent foot exam. Generalized soft tissue edema about the ankle, possible patchy soft tissue air laterally. Periosteal thickening of the proximal fifth metatarsal better assessed on foot radiograph. No bony destructive change about the ankle. The ankle mortise is preserved. No fracture or dislocation. IMPRESSION: 1. Soft tissue defect about the lateral aspect of the fifth metatarsal, better assessed on concurrent foot exam. 2. Generalized soft tissue about the ankle with patchy air in the soft tissues laterally, may be related to recent surgery. Electronically Signed   By: Keith Rake M.D.   On: 02/01/2019 02:46   Mr Foot Right W Wo Contrast  Result Date: 02/01/2019 CLINICAL DATA:  Diabetic wound along the lateral aspect of the foot. Diffuse soft tissue swelling. EXAM: MRI OF THE RIGHT FOREFOOT WITHOUT AND WITH CONTRAST TECHNIQUE: Multiplanar, multisequence MR imaging of the right foot was performed before and after the administration of intravenous contrast. CONTRAST:  53m GADAVIST GADOBUTROL 1 MMOL/ML IV SOLN COMPARISON:  Radiographs 02/01/2019 FINDINGS: There is a large open wound along the lateral plantar aspect of the foot near the base of the fifth metatarsal. There is severe diffuse subcutaneous soft tissue swelling/edema/fluid suggesting severe cellulitis. There  is also scattered areas of gas throughout the soft tissues most notably along the lateral aspect of the hindfoot adjacent to the calcaneus and cuboid. This could be air related to  recent surgery or from the open wound but gas-forming bacteria infection is also possible. Severe diffuse myofasciitis throughout the foot with some scattered gas noted in the short flexor muscles. Findings consistent with pyomyositis. This most notably involves the abductor digiti minimi muscle. Diffuse abnormal signal intensity and enhancement in the fifth metatarsal, cuboid and calcaneus consistent with osteomyelitis. There is also likely involvement of the lateral cuneiform and base of the fourth metatarsal. Complex tibiotalar joint effusion. I do not see any obvious destructive bony changes but there is moderate synovial enhancement and findings are certainly worrisome for septic arthritis. I do not see any definite findings for septic arthritis involving the subtalar joints. The sinus tarsi appears normal. IMPRESSION: 1. Severe diffuse cellulitis and myofasciitis. 2. Large open wound along the lateral aspect of the foot communicating with several rim enhancing fluid collections, likely dissecting abscesses. Some of these contain gas and this is most notable around the lateral aspect of the cuboid and calcaneus. 3. Pyomyositis involving the short flexor muscles of the foot. 4. Osteomyelitis involving the fifth metatarsal, the cuboid and calcaneus. I think is also involvement of the lateral cuneiform and base of the fourth metatarsal. 5. Complex tibiotalar joint effusion.  Septic arthritis is likely. 6. Septic tenosynovitis involving the peroneal tendons. Electronically Signed   By: Marijo Sanes M.D.   On: 02/01/2019 14:46   Dg Foot Complete Right  Result Date: 02/01/2019 CLINICAL DATA:  Wound lateral right foot. Ulcer. Recent foot surgery. EXAM: RIGHT FOOT COMPLETE - 3+ VIEW COMPARISON:  None. FINDINGS: Soft tissue defect about  the lateral foot adjacent to the proximal fifth metatarsal. There is periosteal reaction at the fifth metatarsal base. No other bony destructive or periosteal change. No acute fracture. Diffuse generalized soft tissue edema about the foot and ankle. IMPRESSION: 1. Soft tissue defect about the lateral foot with subjacent periosteal reaction at the base of the fifth metatarsal, suspicious for osteomyelitis. 2. Diffuse soft tissue edema about the foot and ankle. Electronically Signed   By: Keith Rake M.D.   On: 02/01/2019 02:47    EKG: Independently reviewed.  Sinus tachycardia with rate 103; no evidence of acute ischemia   Labs on Admission: I have personally reviewed the available labs and imaging studies at the time of the admission.  Pertinent labs:   Na++ 126, 133 K+ 3.2 Glucose 550, 256 BUN 33/Creatinine 2.17/GFR 29; repeat 32/1.88/35; 25/1.95/22 on 8/28 Lactate 1.7, 2.1, 4.3, 3.0 VBG: 7.290/71.6/34.5 WBC 35.6 Hgb 8.5 Platelets 461 UA: >500 glucose; ?300 protein; mod Hgb Blood and urine cultures pending HCG negative   Assessment/Plan Principal Problem:   Diabetic foot ulcer with osteomyelitis (HCC) Active Problems:   Diabetes mellitus out of control ([place])   Essential hypertension, benign   Gastroparesis   BMI 45.0-49.9, adult (HCC)   Adjustment disorder   Sepsis due to cellulitis ([place])    Sepsis due to cellulitis associated with foot ulcer and probable osteomyelitis -SIRS criteria in this patient includes: Marked leukocytosis, borderline fever, tachycardia, tachypnea  -Patient has evidence of acute organ failure with elevated lactate  -While awaiting blood cultures, this appears to be a preseptic condition. -Sepsis protocol initiated -Patient had lactate >4 and so has received the 30 cc/kg IVF bolus. -Suspected source is R foot ulcer (see below) -Blood and urine cultures pending -Will admit due to: severity of foot infection and high likelihood of need for  intervention -Treat with IV Aztreonam/Vanc/Flagyl for diabetes foot infection -Initial treatment of septic arthritis is Vancomycin pending  sensitivity results -Will add HIV -Will trend lactate to ensure improvement -Will order sepsis protocol procalcitonin level.  Antibiotics would not be indicated for PCT <0.1 and probably should not be used for < 0.25.  >0.5 indicates infection and >>0.5 indicates more serious disease.  As the procalcitonin level normalizes, it will be reasonable to consider de-escalation of antibiotic coverage.  Diabetic foot ulcer -Prior foot MRI on 8/27 was negative for osteomyelitis, but with marked soft tissue swelling and edema suggestive of cellulitis with diffuse nonspecific myositis -Patient was seen recently but did not fill rx for Keflex or Cipro -Foot ulcer is present and draining and now the patient has developed surrounding cellulitis extending up her lower leg -Xrays were not suggestive of osteomyelitis -MRI was performed that shows diffuse cellulitis and myofasciitis; large open wound with likely dissecting abscesses; pyomyositis of the short flexor muscles of the foot; osteo of the 5th metatarsal/cuboid/calcaneous/lateral cuneiform/base of 4th metatarsal; complex tibiotalar joint effusion, likely with septic arthritis; septic tenosynovitis involving the peroneal tendons. -Based on refractory ulcer infection with osteitis and severe diffuse foot and ankle infection, it seems that the patient may be heading for amputation -I have discussed the patient with Dr. Donnetta Hutching, who is planning to discuss the patient tomorrow AM with Dr. Sharol Given -Patient is NPO after midnight in case she needs a procedure tomorrow -LE wound order set utilized including labs (CRP, ESR, A1c, prealbumin, HIV, and blood cultures) and consults (CM; diabetes coordinator; peripheral vascular navigator; SW; wound care; and nutrition)  N/V, likely due to gastroparesis and/or infection -She is having  persistent n/v with known h/o gastroparesis -Current symptoms may also be associated with acute severe infection/sepsis -Will start standing Reglan and prn Zofran with Phenergan suppositories as needed -Clear liquids for now, NPO after MN as above  Uncontrolled DM -She has poorly controlled DM -Repeat A1c -She was started on an insulin drip but is not in DKA and does not appear to need this at this time -Stop insulin drip -Will give Lantus - previously on 40 units daily but recently appears to only be taking 10 so will start with 10 for now -SSI coverage with resistant-scale insulin  HTN -She does not appear to be taking medication for this issue at this time  Stage 3-4 CKD -Appears to be slightly worse than baseline but midlly so -IVF -Recheck in AM -Sadly, she may be close to ready for nephrology outpatient f/u and even possibly placement of fistula in anticipation of eventual need for HD -This is devastating given her young age -Stable anemia, likely associated with CKD  Adjustment disorder -She has significant medical problems with a poor overall prognosis -She also suffered with the loss of her mother recently -She appears depressed -I discussed this with the patient and she reports having taken Lexapro without improvement -She denies current depression/SI -Would consider psychiatric consultation, particularly if she ends up with BKA  Obesity -BMI 45, which is much improved from >50 prior -Weight loss should be encouraged -Outpatient PCP/bariatric medicine/bariatric surgery f/u encouraged -If she requires BKA, this will be a significant complicating factor for her   Note: This patient has been tested and is negative for the novel coronavirus COVID-19.   DVT prophylaxis:  Heparin Code Status:  Full - confirmed with patient Family Communication: None present Disposition Plan:  Home once clinically improved Consults called: Vascular surgery  Admission status: Admit  - It is my clinical opinion that admission to INPATIENT is reasonable and necessary because of the  expectation that this patient will require hospital care that crosses at least 2 midnights to treat this condition based on the medical complexity of the problems presented.  Given the aforementioned information, the predictability of an adverse outcome is felt to be significant.   Karmen Bongo MD [facility]   How to contact the [facility] Attending or Consulting provider [place] or covering provider during after hours [place], for this patient?  1. Check the care team in [facility] and look for a) attending/consulting [facility] provider listed and b) the [facility] team listed 2. Log into [email or web address] and use [place]'s universal password to access. If you do not have the password, please contact the hospital operator. 3. Locate the [facility] provider you are looking for under [facility] and page to a number that you can be directly reached. 4. If you still have difficulty reaching the provider, please page the [facility] (Director on Call) for the Hospitalists listed on amion for assistance.   02/01/2019, 5:08 PM

## 2019-02-01 NOTE — ED Notes (Signed)
Pt used bed pan. Pt was only able to output 50 mls

## 2019-02-01 NOTE — ED Triage Notes (Signed)
Pt presents with emesis x 4 days, intermittent fevers. Pt has large foul smelling diabetic ulcer to R foot. Pt states after last appt with surgeon pt did not get antbx from pharmacy.  Pt appears weak in triage, emesis while in triage.

## 2019-02-01 NOTE — Progress Notes (Signed)
RN paged [facility] to request Phenergan route to IV.   Dose is currently Phenergan suppository.   Awaiting response.

## 2019-02-01 NOTE — Progress Notes (Signed)
Pharmacy Antibiotic Note  Sheila Austin is a 33 y.o. female admitted on 01/31/2019 with diabetic foot wound.  Pharmacy has been consulted for Vancomycin/Aztreonam dosing. 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 Received and outpatient prescription for Keflex a few days ago but did not fill it due to the allergy. WBC is markedly elevated at 35.6. Acute renal failure. Pt vomiting around 10 times per day.   Plan: Vancomycin 2000 mg IV x 1, then given 1250 mg IV q24h >>Estimated AUC: 533 Aztreonam 1g IV q8h Trend WBC, temp, renal function  F/U infectious work-up Drug levels as indicated   Weight: 248 lb 0.3 oz (112.5 kg)  Temp (24hrs), Avg:98.4 F (36.9 C), Min:98.4 F (36.9 C), Max:98.4 F (36.9 C)  Recent Labs  Lab 02/01/19 0033 02/01/19 0150  WBC 35.6*  --   CREATININE 2.17*  --   LATICACIDVEN 1.7 2.1*    Estimated Creatinine Clearance: 44.5 mL/min (A) (by C-G formula based on SCr of 2.17 mg/dL (H)).    Allergies  Allergen Reactions  . Bee Venom Anaphylaxis, Hives, Shortness Of Breath and Swelling  . Penicillins Anaphylaxis    Has patient had a PCN reaction causing immediate rash, facial/tongue/throat swelling, SOB or lightheadedness with hypotension: yes Has patient had a PCN reaction causing severe rash involving mucus membranes or skin necrosis: unknown Has patient had a PCN reaction that required hospitalization : yes Has patient had a PCN reaction occurring within the last 10 years: no If all of the above answers are "NO", then may proceed with Cephalosporin use.    Narda Bonds, PharmD, BCPS Clinical Pharmacist Phone: [phone]

## 2019-02-01 NOTE — ED Notes (Signed)
ED TO INPATIENT HANDOFF REPORT  ED Nurse Name and Phone #: Caryl Pina [phone]  S Name/Age/Gender Sheila Austin 33 y.o. female Room/Bed: 014C/014C  Code Status   Code Status: Full Code  Home/SNF/Other Home Patient oriented to: situation Is this baseline? Yes   Triage Complete: Triage complete  Chief Complaint Emesis, wound on foot  Triage Note Pt presents with emesis x 4 days, intermittent fevers. Pt has large foul smelling diabetic ulcer to R foot. Pt states after last appt with surgeon pt did not get antbx from pharmacy.  Pt appears weak in triage, emesis while in triage.    Allergies Allergies  Allergen Reactions  . Bee Venom Anaphylaxis, Hives, Shortness Of Breath and Swelling  . Penicillins Anaphylaxis    Has patient had a PCN reaction causing immediate rash, facial/tongue/throat swelling, SOB or lightheadedness with hypotension: yes Has patient had a PCN reaction causing severe rash involving mucus membranes or skin necrosis: unknown Has patient had a PCN reaction that required hospitalization : yes Has patient had a PCN reaction occurring within the last 10 years: no If all of the above answers are "NO", then may proceed with Cephalosporin use.     Level of Care/Admitting Diagnosis ED Disposition    ED Disposition Condition [facility] Area: [place] [[id number]]  Level of Care: Med-Surg [16]  Covid Evaluation: Asymptomatic Screening Protocol (No Symptoms)  Diagnosis: Diabetic foot infection [facility][id number]  Admitting Physician: Karmen Bongo [[id number]]  Attending Physician: Karmen Bongo [[id number]]  Estimated length of stay: 5 - 7 days  Certification:: I certify this patient will need inpatient services for at least 2 midnights  PT Class (Do Not Modify): Inpatient [101]  PT Acc Code (Do Not Modify): Private [1]       B Medical/Surgery History Past Medical History:  Diagnosis Date  . Diabetes mellitus    uncontrolled, last A1c  was 14  . E. coli sepsis ([place])   . Ectopic pregnancy   . Essential hypertension, benign 11/01/2008  . Fatty liver   . Gastroparesis   . GERD (gastroesophageal reflux disease)   . Helicobacter pylori gastritis 09/07/2011  . Migraine   . Morbid obesity with body mass index (BMI) of 40.0 to 49.9 ([place])   . Pulmonary edema   . Pyelonephritis   . Ureteral obstruction    Past Surgical History:  Procedure Laterality Date  . ESOPHAGOGASTRODUODENOSCOPY  2010  . WOUND DEBRIDEMENT Right 01/03/2019   Procedure: DEBRIDEMENT WOUND RIGHT LATERAL FOOT;  Surgeon: Angelia Mould, MD;  Location: [facility] OR;  Service: Vascular;  Laterality: Right;     A IV Location/Drains/Wounds Patient Lines/Drains/Airways Status   Active Line/Drains/Airways    Name:   Placement date:   Placement time:   Site:   Days:   Peripheral IV 02/01/19 Left Antecubital   02/01/19    0143    Antecubital   less than 1   Peripheral IV 02/01/19 Right Antecubital   02/01/19    0222    Antecubital   less than 1   Incision (Closed) 01/03/19 Foot Right   01/03/19    1141     29   Wound / Incision (Open or Dehisced) 01/01/19 Diabetic ulcer Foot Right;Lateral   01/01/19    0824    Foot   31          Intake/Output Last 24 hours  Intake/Output Summary (Last 24 hours) at 02/01/2019 1501 Last data filed at 02/01/2019 1133  Gross per 24 hour  Intake 830 ml  Output -  Net 830 ml    Labs/Imaging Results for orders placed or performed during the hospital encounter of 01/31/19 (from the past 48 hour(s))  Urinalysis, Routine w reflex microscopic     Status: Abnormal   Collection Time: 02/01/19 12:25 AM  Result Value Ref Range   Color, Urine AMBER (A) YELLOW    Comment: BIOCHEMICALS MAY BE AFFECTED BY COLOR   APPearance CLOUDY (A) CLEAR   Specific Gravity, Urine 1.022 1.005 - 1.030   pH 5.0 5.0 - 8.0   Glucose, UA >=500 (A) NEGATIVE mg/dL   Hgb urine dipstick MODERATE (A) NEGATIVE   Bilirubin Urine NEGATIVE NEGATIVE   Ketones, ur  NEGATIVE NEGATIVE mg/dL   Protein, ur >=300 (A) NEGATIVE mg/dL   Nitrite NEGATIVE NEGATIVE   Leukocytes,Ua NEGATIVE NEGATIVE   RBC / HPF 11-20 0 - 5 RBC/hpf   WBC, UA 6-10 0 - 5 WBC/hpf   Bacteria, UA RARE (A) NONE SEEN   Squamous Epithelial / LPF 0-5 0 - 5   Mucus PRESENT    Hyaline Casts, UA PRESENT    Granular Casts, UA PRESENT     Comment: Performed at [facility], [address], [place], [place] [address]  Lactic acid, plasma     Status: None   Collection Time: 02/01/19 12:33 AM  Result Value Ref Range   Lactic Acid, Venous 1.7 0.5 - 1.9 mmol/L    Comment: Performed at [facility] [address] [place], [place] [address]  Comprehensive metabolic panel     Status: Abnormal   Collection Time: 02/01/19 12:33 AM  Result Value Ref Range   Sodium 126 (L) 135 - 145 mmol/L   Potassium 3.7 3.5 - 5.1 mmol/L   Chloride 81 (L) 98 - 111 mmol/L   CO2 30 22 - 32 mmol/L   Glucose, Bld 550 (HH) 70 - 99 mg/dL    Comment: CRITICAL RESULT CALLED TO, READ BACK BY AND VERIFIED WITH: FLORES M,RN 02/01/19 0131 WAYK    BUN 33 (H) 6 - 20 mg/dL   Creatinine, Ser 2.17 (H) 0.44 - 1.00 mg/dL   Calcium 8.7 (L) 8.9 - 10.3 mg/dL   Total Protein 7.9 6.5 - 8.1 g/dL   Albumin 2.0 (L) 3.5 - 5.0 g/dL   AST 15 15 - 41 U/L   ALT 12 0 - 44 U/L   Alkaline Phosphatase 215 (H) 38 - 126 U/L   Total Bilirubin 1.2 0.3 - 1.2 mg/dL   GFR calc non Af Amer 29 (L) >60 mL/min   GFR calc Af Amer 34 (L) >60 mL/min   Anion gap 15 5 - 15    Comment: Performed at [facility] [address] [place] [address]  CBC with Differential     Status: Abnormal   Collection Time: 02/01/19 12:33 AM  Result Value Ref Range   WBC 35.6 (H) 4.0 - 10.5 K/uL   RBC 3.41 (L) 3.87 - 5.11 MIL/uL   Hemoglobin 9.5 (L) 12.0 - 15.0 g/dL   HCT 28.7 (L) 36.0 - 46.0 %   MCV 84.2 80.0 - 100.0 fL   MCH 27.9 26.0 - 34.0 pg   MCHC 33.1 30.0 - 36.0 g/dL   RDW 12.8 11.5 - 15.5 %   Platelets 461 (H) 150 - 400 K/uL    nRBC 0.0 0.0 - 0.2 %   Neutrophils Relative % 91 %   Neutro  Abs 32.3 (H) 1.7 - 7.7 K/uL   Lymphocytes Relative 3 %   Lymphs Abs 1.2 0.7 - 4.0 K/uL   Monocytes Relative 5 %   Monocytes Absolute 1.7 (H) 0.1 - 1.0 K/uL   Eosinophils Relative 0 %   Eosinophils Absolute 0.0 0.0 - 0.5 K/uL   Basophils Relative 0 %   Basophils Absolute 0.1 0.0 - 0.1 K/uL   WBC Morphology VACUOLATED NEUTROPHILS    Immature Granulocytes 1 %   Abs Immature Granulocytes 0.37 (H) 0.00 - 0.07 K/uL    Comment: Performed at [facility] [address] [place], [place] [address]  Lactic acid, plasma     Status: Abnormal   Collection Time: 02/01/19  1:50 AM  Result Value Ref Range   Lactic Acid, Venous 2.1 (HH) 0.5 - 1.9 mmol/L    Comment: CRITICAL RESULT CALLED TO, READ BACK BY AND VERIFIED WITH: B.FLORES,RN [id number] 02/01/2019 M.CAMPBELL Performed at [facility], [place] [address], [place], [place] [address]   APTT     Status: None   Collection Time: 02/01/19  1:50 AM  Result Value Ref Range   aPTT 32 24 - 36 seconds    Comment: Performed at [facility] [address], [place], [place] [address]  Protime-INR     Status: Abnormal   Collection Time: 02/01/19  1:50 AM  Result Value Ref Range   Prothrombin Time 15.4 (H) 11.4 - 15.2 seconds   INR 1.2 0.8 - 1.2    Comment: (NOTE) INR goal varies based on device and disease states. Performed at [facility], [place] [address] [place], [place] [address]   I-Stat beta hCG blood, ED     Status: None   Collection Time: 02/01/19  1:55 AM  Result Value Ref Range   I-stat hCG, quantitative <5.0 <5 mIU/mL   Comment 3            Comment:   GEST. AGE      CONC.  (mIU/mL)   <=1 WEEK        5 - 50     2 WEEKS       50 - 500     3 WEEKS       100 - 10,000     4 WEEKS     1,000 - 30,000        FEMALE AND NON-PREGNANT FEMALE:     LESS THAN 5 mIU/mL   POCT I-Stat EG7     Status: Abnormal   Collection Time: 02/01/19  1:56 AM  Result Value Ref Range    pH, Ven 7.290 7.250 - 7.430   pCO2, Ven 71.6 (HH) 44.0 - 60.0 mmHg   pO2, Ven 54.0 (H) 32.0 - 45.0 mmHg   Bicarbonate 34.5 (H) 20.0 - 28.0 mmol/L   TCO2 37 (H) 22 - 32 mmol/L   O2 Saturation 82.0 %   Acid-Base Excess 6.0 (H) 0.0 - 2.0 mmol/L   Sodium 127 (L) 135 - 145 mmol/L   Potassium 3.6 3.5 - 5.1 mmol/L   Calcium, Ion 1.15 1.15 - 1.40 mmol/L   HCT 33.0 (L) 36.0 - 46.0 %   Hemoglobin 11.2 (L) 12.0 - 15.0 g/dL   Patient temperature 98.4 F    Sample type VENOUS    Comment NOTIFIED PHYSICIAN   SARS Coronavirus 2 [facility] order, Performed in [place] hospital lab) Nasopharyngeal Nasopharyngeal Swab     Status: None   Collection Time: 02/01/19  3:50 AM   Specimen: Nasopharyngeal Swab  Result Value Ref Range   SARS Coronavirus 2 NEGATIVE NEGATIVE    Comment: (NOTE) If result is NEGATIVE SARS-CoV-2 target nucleic acids are NOT DETECTED. The SARS-CoV-2 RNA is generally detectable in upper and lower  respiratory specimens during the acute phase of infection. The lowest  concentration of SARS-CoV-2 viral copies this assay can detect is 250  copies / mL. A negative result does not preclude SARS-CoV-2 infection  and should not be used as the sole basis for treatment or other  patient management decisions.  A negative result may occur with  improper specimen collection / handling, submission of specimen other  than nasopharyngeal swab, presence of viral mutation(s) within the  areas targeted by this assay, and inadequate number of viral copies  (<250 copies / mL). A negative result must be combined with clinical  observations, patient history, and epidemiological information. If result is POSITIVE SARS-CoV-2 target nucleic acids are DETECTED. The SARS-CoV-2 RNA is generally detectable in upper and lower  respiratory specimens dur ing the acute phase of infection.  Positive  results are indicative of active infection with SARS-CoV-2.  Clinical  correlation with patient history and  other diagnostic information is  necessary to determine patient infection status.  Positive results do  not rule out bacterial infection or co-infection with other viruses. If result is PRESUMPTIVE POSTIVE SARS-CoV-2 nucleic acids MAY BE PRESENT.   A presumptive positive result was obtained on the submitted specimen  and confirmed on repeat testing.  While 2019 novel coronavirus  (SARS-CoV-2) nucleic acids may be present in the submitted sample  additional confirmatory testing may be necessary for epidemiological  and / or clinical management purposes  to differentiate between  SARS-CoV-2 and other Sarbecovirus currently known to infect humans.  If clinically indicated additional testing with an alternate test  methodology [phone]) is advised. The SARS-CoV-2 RNA is generally  detectable in upper and lower respiratory sp ecimens during the acute  phase of infection. The expected result is Negative. Fact Sheet for Patients:  StrictlyIdeas.no Fact Sheet for Healthcare Providers: BankingDealers.co.za This test is not yet approved or cleared by the [place] FDA and has been authorized for detection and/or diagnosis of SARS-CoV-2 by FDA under an Emergency Use Authorization (EUA).  This EUA will remain in effect (meaning this test can be used) for the duration of the COVID-19 declaration under Section 564(b)(1) of the Act, 21 U.S.C. section 360bbb-3(b)(1), unless the authorization is terminated or revoked sooner. Performed at [facility], [place] [address] [place], [place] [address]   CBG monitoring, ED     Status: Abnormal   Collection Time: 02/01/19  4:24 AM  Result Value Ref Range   Glucose-Capillary 418 (H) 70 - 99 mg/dL  Lactic acid, plasma     Status: Abnormal   Collection Time: 02/01/19  5:06 AM  Result Value Ref Range   Lactic Acid, Venous 4.3 (HH) 0.5 - 1.9 mmol/L    Comment: CRITICAL VALUE NOTED.  VALUE IS CONSISTENT  WITH PREVIOUSLY REPORTED AND CALLED VALUE. Performed at [facility], [place] [address], [place], [place] [address]   CBG monitoring, ED     Status: Abnormal   Collection Time: 02/01/19  5:33 AM  Result Value Ref Range   Glucose-Capillary 330 (H) 70 - 99 mg/dL  CBG monitoring, ED     Status: Abnormal   Collection Time: 02/01/19  6:30 AM  Result Value Ref Range   Glucose-Capillary  265 (H) 70 - 99 mg/dL  Basic metabolic panel     Status: Abnormal   Collection Time: 02/01/19  6:55 AM  Result Value Ref Range   Sodium 133 (L) 135 - 145 mmol/L   Potassium 3.2 (L) 3.5 - 5.1 mmol/L   Chloride 92 (L) 98 - 111 mmol/L   CO2 28 22 - 32 mmol/L   Glucose, Bld 256 (H) 70 - 99 mg/dL   BUN 32 (H) 6 - 20 mg/dL   Creatinine, Ser 1.88 (H) 0.44 - 1.00 mg/dL   Calcium 8.2 (L) 8.9 - 10.3 mg/dL   GFR calc non Af Amer 35 (L) >60 mL/min   GFR calc Af Amer 41 (L) >60 mL/min   Anion gap 13 5 - 15    Comment: Performed at [facility] [address], [place], [place] [address]  Lactic acid, plasma     Status: Abnormal   Collection Time: 02/01/19  6:55 AM  Result Value Ref Range   Lactic Acid, Venous 3.0 (HH) 0.5 - 1.9 mmol/L    Comment: CRITICAL RESULT CALLED TO, READ BACK BY AND VERIFIED WITH: M.COFFEY,RN @ 0726 02/01/2019 [place] Performed at [facility], [place] [address] [place], [place] [address]   CBG monitoring, ED     Status: Abnormal   Collection Time: 02/01/19  7:46 AM  Result Value Ref Range   Glucose-Capillary 228 (H) 70 - 99 mg/dL  CBG monitoring, ED     Status: Abnormal   Collection Time: 02/01/19  8:49 AM  Result Value Ref Range   Glucose-Capillary 194 (H) 70 - 99 mg/dL   Comment 1 Notify RN    Comment 2 Document in Chart   CBG monitoring, ED     Status: Abnormal   Collection Time: 02/01/19  9:51 AM  Result Value Ref Range   Glucose-Capillary 170 (H) 70 - 99 mg/dL  Basic metabolic panel     Status: Abnormal   Collection Time: 02/01/19 10:20 AM  Result Value Ref Range    Sodium 135 135 - 145 mmol/L   Potassium 3.1 (L) 3.5 - 5.1 mmol/L   Chloride 93 (L) 98 - 111 mmol/L   CO2 27 22 - 32 mmol/L   Glucose, Bld 166 (H) 70 - 99 mg/dL   BUN 29 (H) 6 - 20 mg/dL   Creatinine, Ser 1.61 (H) 0.44 - 1.00 mg/dL   Calcium 8.6 (L) 8.9 - 10.3 mg/dL   GFR calc non Af Amer 42 (L) >60 mL/min   GFR calc Af Amer 49 (L) >60 mL/min   Anion gap 15 5 - 15    Comment: Performed at [facility], [place] [address], [place], [place] [address]  Lactic acid, plasma     Status: Abnormal   Collection Time: 02/01/19 10:20 AM  Result Value Ref Range   Lactic Acid, Venous 2.5 (HH) 0.5 - 1.9 mmol/L    Comment: CRITICAL RESULT CALLED TO, READ BACK BY AND VERIFIED WITH: M.COFFEY,RN @ 1059 02/01/2019 [place] Performed at [facility], [place] [address] [place] [address] [place] [employer], [place] [address]   CBG monitoring, ED     Status: Abnormal   Collection Time: 02/01/19 10:56 AM  Result Value Ref Range   Glucose-Capillary 144 (H) 70 - 99 mg/dL   Comment 1 Notify RN    Comment 2 Document in Chart    Ct Abdomen Pelvis Wo Contrast  Result Date: 02/01/2019 CLINICAL DATA:  Initial evaluation for acute nausea, vomiting. EXAM:  CT ABDOMEN AND PELVIS WITHOUT CONTRAST TECHNIQUE: Multidetector CT imaging of the abdomen and pelvis was performed following the standard protocol without IV contrast. COMPARISON:  Prior CT from 11/25/2014. FINDINGS: Lower chest: Visualized lung bases are clear. Hepatobiliary: Liver demonstrates a normal unenhanced appearance. Gallbladder within normal limits. No biliary dilatation. Pancreas: Pancreas within normal limits. Spleen: Spleen enlarged measuring 16 cm in AP diameter, increased from previous. Adrenals/Urinary Tract: Adrenal glands within normal limits. Kidneys equal in size without evidence for nephrolithiasis or hydronephrosis. No ureterolithiasis or hydroureter. Partially distended bladder within normal limits. Stomach/Bowel: Stomach within normal limits. No evidence for bowel  obstruction. Normal appendix. No acute inflammatory changes seen about the bowels. Vascular/Lymphatic: Intra-abdominal aorta of normal caliber. Mildly enlarged left periaortic lymph node measures 14 mm (series 3, image 42). Multiple additional scattered subcentimeter shotty retroperitoneal nodes noted. In the pelvis, there is an enlarged right common iliac node measuring 11 mm (series 3, image 67). Right external iliac nodes measure up to 16 mm (series 3, image 84). Right inguinal nodes measure up to 19 mm (series 3, image 103). Findings are of uncertain etiology, but new from previous. Reproductive: Uterus and ovaries within normal limits. Other: No free air or fluid. Musculoskeletal: Small focus of soft tissue density within the subcutaneous fat of the right anterior abdomen likely reflects an injection site. No acute osseous abnormality. No discrete lytic or blastic osseous lesions. IMPRESSION: 1. Interval development of splenomegaly with multiple scattered enlarged retroperitoneal, right iliac, and right inguinal lymph nodes as above, indeterminate. While these findings may be reactive in nature, possible lymphoproliferative disorder could also have this appearance. Correlation with laboratory values with possible histologic sampling suggested for further evaluation. 2. No other acute intra-abdominal or pelvic process. Electronically Signed   By: Jeannine Boga M.D.   On: 02/01/2019 05:06   Dg Chest 1 View  Result Date: 02/01/2019 CLINICAL DATA:  Initial evaluation for wound to right foot, history of recent surgery. EXAM: CHEST  1 VIEW COMPARISON:  Prior radiograph from 06/26/2017. FINDINGS: The cardiac and mediastinal silhouettes are stable in size and contour, and remain within normal limits. The lungs are normally inflated. No airspace consolidation, pleural effusion, or pulmonary edema. No pneumothorax. No acute osseous abnormality. IMPRESSION: No active disease. Electronically Signed   By:  Jeannine Boga M.D.   On: 02/01/2019 02:24   Dg Ankle Complete Right  Result Date: 02/01/2019 CLINICAL DATA:  Wound lateral right foot. Ulcer. History of recent foot surgery. EXAM: RIGHT ANKLE - COMPLETE 3+ VIEW COMPARISON:  None. FINDINGS: Soft tissue defect about the lateral aspect of the fifth metatarsal, better assessed on concurrent foot exam. Generalized soft tissue edema about the ankle, possible patchy soft tissue air laterally. Periosteal thickening of the proximal fifth metatarsal better assessed on foot radiograph. No bony destructive change about the ankle. The ankle mortise is preserved. No fracture or dislocation. IMPRESSION: 1. Soft tissue defect about the lateral aspect of the fifth metatarsal, better assessed on concurrent foot exam. 2. Generalized soft tissue about the ankle with patchy air in the soft tissues laterally, may be related to recent surgery. Electronically Signed   By: Keith Rake M.D.   On: 02/01/2019 02:46   Mr Foot Right W Wo Contrast  Result Date: 02/01/2019 CLINICAL DATA:  Diabetic wound along the lateral aspect of the foot. Diffuse soft tissue swelling. EXAM: MRI OF THE RIGHT FOREFOOT WITHOUT AND WITH CONTRAST TECHNIQUE: Multiplanar, multisequence MR imaging of the right foot was performed before and after  the administration of intravenous contrast. CONTRAST:  64mL GADAVIST GADOBUTROL 1 MMOL/ML IV SOLN COMPARISON:  Radiographs 02/01/2019 FINDINGS: There is a large open wound along the lateral plantar aspect of the foot near the base of the fifth metatarsal. There is severe diffuse subcutaneous soft tissue swelling/edema/fluid suggesting severe cellulitis. There is also scattered areas of gas throughout the soft tissues most notably along the lateral aspect of the hindfoot adjacent to the calcaneus and cuboid. This could be air related to recent surgery or from the open wound but gas-forming bacteria infection is also possible. Severe diffuse myofasciitis  throughout the foot with some scattered gas noted in the short flexor muscles. Findings consistent with pyomyositis. This most notably involves the abductor digiti minimi muscle. Diffuse abnormal signal intensity and enhancement in the fifth metatarsal, cuboid and calcaneus consistent with osteomyelitis. There is also likely involvement of the lateral cuneiform and base of the fourth metatarsal. Complex tibiotalar joint effusion. I do not see any obvious destructive bony changes but there is moderate synovial enhancement and findings are certainly worrisome for septic arthritis. I do not see any definite findings for septic arthritis involving the subtalar joints. The sinus tarsi appears normal. IMPRESSION: 1. Severe diffuse cellulitis and myofasciitis. 2. Large open wound along the lateral aspect of the foot communicating with several rim enhancing fluid collections, likely dissecting abscesses. Some of these contain gas and this is most notable around the lateral aspect of the cuboid and calcaneus. 3. Pyomyositis involving the short flexor muscles of the foot. 4. Osteomyelitis involving the fifth metatarsal, the cuboid and calcaneus. I think is also involvement of the lateral cuneiform and base of the fourth metatarsal. 5. Complex tibiotalar joint effusion.  Septic arthritis is likely. 6. Septic tenosynovitis involving the peroneal tendons. Electronically Signed   By: Marijo Sanes M.D.   On: 02/01/2019 14:46   Dg Foot Complete Right  Result Date: 02/01/2019 CLINICAL DATA:  Wound lateral right foot. Ulcer. Recent foot surgery. EXAM: RIGHT FOOT COMPLETE - 3+ VIEW COMPARISON:  None. FINDINGS: Soft tissue defect about the lateral foot adjacent to the proximal fifth metatarsal. There is periosteal reaction at the fifth metatarsal base. No other bony destructive or periosteal change. No acute fracture. Diffuse generalized soft tissue edema about the foot and ankle. IMPRESSION: 1. Soft tissue defect about the  lateral foot with subjacent periosteal reaction at the base of the fifth metatarsal, suspicious for osteomyelitis. 2. Diffuse soft tissue edema about the foot and ankle. Electronically Signed   By: Keith Rake M.D.   On: 02/01/2019 02:47    Pending Labs Unresulted Labs (From admission, onward)    Start     Ordered   02/02/19 [id number]  Basic metabolic panel  Tomorrow morning,   R     02/01/19 1128   02/02/19 0500  CBC  Tomorrow morning,   R     02/01/19 1128   02/01/19 1300  Lactic acid, plasma  STAT Now then every 3 hours,   STAT     02/01/19 1128   02/01/19 1122  Hemoglobin A1c  Once,   STAT     02/01/19 1128   02/01/19 1122  Sedimentation rate  Once,   STAT     02/01/19 1128   02/01/19 1122  C-reactive protein  Once,   STAT     02/01/19 1128   02/01/19 1122  Prealbumin  Once,   STAT     02/01/19 1128   02/01/19 1122  Procalcitonin  Once,  STAT     02/01/19 1128   02/01/19 1016  Aerobic Culture (superficial specimen)  Once,   STAT     02/01/19 1015   02/01/19 0128  Urine culture  ONCE - STAT,   STAT     02/01/19 0128   02/01/19 0046  Blood culture (routine x 2)  BLOOD CULTURE X 2,   STAT     02/01/19 0045   02/01/19 0001  Blood culture (routine x 2)  BLOOD CULTURE X 2,   STAT     02/01/19 0000          Vitals/Pain Today's Vitals   02/01/19 0945 02/01/19 1030 02/01/19 1236 02/01/19 1444  BP: (!) 150/87 (!) 149/98 (!) 155/96 (!) 174/99  Pulse: 99 100 (!) 106 (!) 109  Resp: (!) 25 17 18 16   Temp:      TempSrc:      SpO2: 97% 100% 100% 97%  Weight:      PainSc:   4      Isolation Precautions No active isolations  Medications Medications  vancomycin (VANCOCIN) 1,250 mg in sodium chloride 0.9 % 250 mL IVPB (has no administration in time range)  aztreonam (AZACTAM) 1 g in sodium chloride 0.9 % 100 mL IVPB (has no administration in time range)  metroNIDAZOLE (FLAGYL) IVPB 500 mg (has no administration in time range)  insulin glargine (LANTUS) injection 10 Units  (has no administration in time range)  sodium chloride flush (NS) 0.9 % injection 3 mL (3 mLs Intravenous Given 02/01/19 1232)  acetaminophen (TYLENOL) tablet 650 mg (has no administration in time range)    Or  acetaminophen (TYLENOL) suppository 650 mg (has no administration in time range)  docusate sodium (COLACE) capsule 100 mg (has no administration in time range)  polyethylene glycol (MIRALAX / GLYCOLAX) packet 17 g (has no administration in time range)  ondansetron (ZOFRAN) tablet 4 mg (has no administration in time range)    Or  ondansetron (ZOFRAN) injection 4 mg (has no administration in time range)  heparin injection 5,000 Units (has no administration in time range)  0.9 %  sodium chloride infusion ( Intravenous New Bag/Given 02/01/19 1224)  metoCLOPramide (REGLAN) injection 5 mg (5 mg Intravenous Given 02/01/19 1226)  promethazine (PHENERGAN) suppository 12.5-25 mg (has no administration in time range)  insulin aspart (novoLOG) injection 0-20 Units (0 Units Subcutaneous Not Given 02/01/19 1200)  insulin aspart (novoLOG) injection 0-5 Units (has no administration in time range)  sodium chloride flush (NS) 0.9 % injection 3 mL (3 mLs Intravenous Given 02/01/19 0222)  ondansetron (ZOFRAN) tablet 8 mg (8 mg Oral Given 02/01/19 0159)  aztreonam (AZACTAM) 2 g in sodium chloride 0.9 % 100 mL IVPB (0 g Intravenous Stopped 02/01/19 0346)  metroNIDAZOLE (FLAGYL) IVPB 500 mg (0 mg Intravenous Stopped 02/01/19 0313)  sodium chloride 0.9 % bolus 1,000 mL (0 mLs Intravenous Stopped 02/01/19 0256)  ondansetron (ZOFRAN) injection 4 mg (4 mg Intravenous Given 02/01/19 0312)  alum & mag hydroxide-simeth (MAALOX/MYLANTA) 200-200-20 MG/5ML suspension 30 mL (30 mLs Oral Given 02/01/19 0204)    And  lidocaine (XYLOCAINE) 2 % viscous mouth solution 15 mL (15 mLs Oral Given 02/01/19 0204)  vancomycin (VANCOCIN) 2,000 mg in sodium chloride 0.9 % 500 mL IVPB (0 mg Intravenous Stopped 02/01/19 0606)  sodium chloride  0.9 % bolus 1,000 mL (0 mLs Intravenous Stopped 02/01/19 0430)  fentaNYL (SUBLIMAZE) injection 50 mcg (50 mcg Intravenous Given 02/01/19 0543)  ondansetron (ZOFRAN) injection 4 mg (4 mg Intravenous  Given 02/01/19 0542)  sodium chloride 0.9 % bolus 1,000 mL (0 mLs Intravenous Stopped 02/01/19 0845)  promethazine (PHENERGAN) injection 25 mg (25 mg Intravenous Given 02/01/19 1242)  gadobutrol (GADAVIST) 1 MMOL/ML injection 10 mL (10 mLs Intravenous Contrast Given 02/01/19 1421)    Mobility walks with person assist     Focused Assessments Cardiac Assessment Handoff:    No results found for: CKTOTAL, CKMB, CKMBINDEX, TROPONINI Lab Results  Component Value Date   DDIMER 0.48 04/07/2014   Does the Patient currently have chest pain? No     R Recommendations: See Admitting Provider Note  Report given to:   Additional Notes: .

## 2019-02-01 NOTE — ED Notes (Signed)
Called for a regular hospital bed

## 2019-02-01 NOTE — ED Notes (Signed)
Pt returned to room, resting comfortably.

## 2019-02-02 ENCOUNTER — Inpatient Hospital Stay (HOSPITAL_COMMUNITY): Payer: 59

## 2019-02-02 DIAGNOSIS — L0390 Cellulitis, unspecified: Secondary | ICD-10-CM

## 2019-02-02 DIAGNOSIS — Z6841 Body Mass Index (BMI) 40.0 and over, adult: Secondary | ICD-10-CM

## 2019-02-02 DIAGNOSIS — E1165 Type 2 diabetes mellitus with hyperglycemia: Secondary | ICD-10-CM

## 2019-02-02 DIAGNOSIS — L97513 Non-pressure chronic ulcer of other part of right foot with necrosis of muscle: Secondary | ICD-10-CM

## 2019-02-02 DIAGNOSIS — E1169 Type 2 diabetes mellitus with other specified complication: Secondary | ICD-10-CM

## 2019-02-02 DIAGNOSIS — L97509 Non-pressure chronic ulcer of other part of unspecified foot with unspecified severity: Secondary | ICD-10-CM

## 2019-02-02 DIAGNOSIS — A419 Sepsis, unspecified organism: Secondary | ICD-10-CM

## 2019-02-02 DIAGNOSIS — M869 Osteomyelitis, unspecified: Secondary | ICD-10-CM

## 2019-02-02 DIAGNOSIS — M7989 Other specified soft tissue disorders: Secondary | ICD-10-CM

## 2019-02-02 DIAGNOSIS — E11621 Type 2 diabetes mellitus with foot ulcer: Secondary | ICD-10-CM

## 2019-02-02 DIAGNOSIS — E43 Unspecified severe protein-calorie malnutrition: Secondary | ICD-10-CM

## 2019-02-02 LAB — BASIC METABOLIC PANEL
Anion gap: 15 (ref 5–15)
BUN: 27 mg/dL — ABNORMAL HIGH (ref 6–20)
CO2: 25 mmol/L (ref 22–32)
Calcium: 7.7 mg/dL — ABNORMAL LOW (ref 8.9–10.3)
Chloride: 93 mmol/L — ABNORMAL LOW (ref 98–111)
Creatinine, Ser: 1.4 mg/dL — ABNORMAL HIGH (ref 0.44–1.00)
GFR calc Af Amer: 58 mL/min — ABNORMAL LOW (ref >60)
GFR calc non Af Amer: 50 mL/min — ABNORMAL LOW (ref >60)
Glucose, Bld: 290 mg/dL — ABNORMAL HIGH (ref 70–99)
Potassium: 3.3 mmol/L — ABNORMAL LOW (ref 3.5–5.1)
Sodium: 133 mmol/L — ABNORMAL LOW (ref 135–145)

## 2019-02-02 LAB — GLUCOSE, CAPILLARY
Glucose-Capillary: 500 mg/dL — ABNORMAL HIGH (ref 70–99)
Glucose-Capillary: 509 mg/dL (ref 70–99)
Glucose-Capillary: 341 mg/dL — ABNORMAL HIGH (ref 70–99)
Glucose-Capillary: 227 mg/dL — ABNORMAL HIGH (ref 70–99)
Glucose-Capillary: 195 mg/dL — ABNORMAL HIGH (ref 70–99)
Glucose-Capillary: 153 mg/dL — ABNORMAL HIGH (ref 70–99)

## 2019-02-02 LAB — CBC
HCT: 24.9 % — ABNORMAL LOW (ref 36.0–46.0)
Hemoglobin: 8.1 g/dL — ABNORMAL LOW (ref 12.0–15.0)
MCH: 27.8 pg (ref 26.0–34.0)
MCHC: 32.5 g/dL (ref 30.0–36.0)
MCV: 85.6 fL (ref 80.0–100.0)
Platelets: 385 10*3/uL (ref 150–400)
RBC: 2.91 MIL/uL — ABNORMAL LOW (ref 3.87–5.11)
RDW: 13.1 % (ref 11.5–15.5)
WBC: 29.2 10*3/uL — ABNORMAL HIGH (ref 4.0–10.5)
nRBC: 0 % (ref 0.0–0.2)

## 2019-02-02 LAB — URINE CULTURE

## 2019-02-02 LAB — IRON AND TIBC
Iron: 14 ug/dL — ABNORMAL LOW (ref 28–170)
Saturation Ratios: 9 % — ABNORMAL LOW (ref 10.4–31.8)
TIBC: 164 ug/dL — ABNORMAL LOW (ref 250–450)
UIBC: 150 ug/dL

## 2019-02-02 LAB — FERRITIN: Ferritin: 792 ng/mL — ABNORMAL HIGH (ref 11–307)

## 2019-02-02 MED ORDER — GLUCERNA SHAKE PO LIQD
237.0000 mL | Freq: Every day | ORAL | Status: DC
  Administered 2019-02-02 – 2019-02-09 (×7): 237 mL via ORAL

## 2019-02-02 MED ORDER — INSULIN GLARGINE 100 UNIT/ML ~~LOC~~ SOLN
15.0000 [IU] | Freq: Once | SUBCUTANEOUS | Status: AC
  Administered 2019-02-02: 15 [IU] via SUBCUTANEOUS
  Filled 2019-02-02: qty 0.15

## 2019-02-02 MED ORDER — MORPHINE SULFATE (PF) 2 MG/ML IV SOLN
2.0000 mg | Freq: Once | INTRAVENOUS | Status: DC
  Filled 2019-02-02: qty 1

## 2019-02-02 MED ORDER — POTASSIUM CHLORIDE CRYS ER 20 MEQ PO TBCR
40.0000 meq | EXTENDED_RELEASE_TABLET | Freq: Every day | ORAL | Status: AC
  Administered 2019-02-02 – 2019-02-03 (×2): 40 meq via ORAL
  Filled 2019-02-02 (×2): qty 2

## 2019-02-02 MED ORDER — ACETAMINOPHEN 500 MG PO TABS
500.0000 mg | ORAL_TABLET | Freq: Three times a day (TID) | ORAL | Status: DC
  Administered 2019-02-02 – 2019-02-04 (×6): 500 mg via ORAL
  Filled 2019-02-02 (×6): qty 1

## 2019-02-02 MED ORDER — HYDROMORPHONE HCL 1 MG/ML IJ SOLN
0.5000 mg | INTRAMUSCULAR | Status: DC | PRN
  Administered 2019-02-02 – 2019-02-04 (×9): 0.5 mg via INTRAVENOUS
  Filled 2019-02-02 (×9): qty 1

## 2019-02-02 MED ORDER — INSULIN GLARGINE 100 UNIT/ML ~~LOC~~ SOLN
25.0000 [IU] | Freq: Every day | SUBCUTANEOUS | Status: DC
  Administered 2019-02-03 – 2019-02-09 (×7): 25 [IU] via SUBCUTANEOUS
  Filled 2019-02-02 (×7): qty 0.25

## 2019-02-02 MED ORDER — OXYCODONE HCL 5 MG PO TABS
5.0000 mg | ORAL_TABLET | Freq: Four times a day (QID) | ORAL | Status: DC | PRN
  Administered 2019-02-02 – 2019-02-04 (×5): 5 mg via ORAL
  Filled 2019-02-02 (×6): qty 1

## 2019-02-02 MED ORDER — PANTOPRAZOLE SODIUM 40 MG PO TBEC
40.0000 mg | DELAYED_RELEASE_TABLET | Freq: Every day | ORAL | Status: DC
  Administered 2019-02-02 – 2019-02-09 (×7): 40 mg via ORAL
  Filled 2019-02-02 (×7): qty 1

## 2019-02-02 MED ORDER — LIVING WELL WITH DIABETES BOOK
Freq: Once | Status: AC
  Administered 2019-02-03: 01:00:00
  Filled 2019-02-02 (×2): qty 1

## 2019-02-02 MED ORDER — PRO-STAT SUGAR FREE PO LIQD
30.0000 mL | Freq: Two times a day (BID) | ORAL | Status: DC
  Administered 2019-02-02 – 2019-02-09 (×13): 30 mL via ORAL
  Filled 2019-02-02 (×13): qty 30

## 2019-02-02 NOTE — Consult Note (Signed)
ORTHOPAEDIC CONSULTATION  REQUESTING PHYSICIAN: Kinnie Feil, MD  Chief Complaint: Purulent draining abscess lateral right foot.  HPI: Sheila Austin is a 33 y.o. female who presents with chronic ulceration lateral right foot.  Patient states that it has continued to get worse.  She states she has fever and chills purulent drainage from the wound with increased pain.  Past Medical History:  Diagnosis Date  . Diabetes mellitus    uncontrolled, last A1c was 14  . E. coli sepsis ([place])   . Ectopic pregnancy   . Essential hypertension, benign 11/01/2008  . Fatty liver   . Gastroparesis   . GERD (gastroesophageal reflux disease)   . Helicobacter pylori gastritis 09/07/2011  . Migraine   . Morbid obesity with body mass index (BMI) of 40.0 to 49.9 ([place])   . Pulmonary edema   . Pyelonephritis   . Ureteral obstruction    Past Surgical History:  Procedure Laterality Date  . ESOPHAGOGASTRODUODENOSCOPY  2010  . WOUND DEBRIDEMENT Right 01/03/2019   Procedure: DEBRIDEMENT WOUND RIGHT LATERAL FOOT;  Surgeon: Angelia Mould, MD;  Location: [facility] OR;  Service: Vascular;  Laterality: Right;   Social History   Socioeconomic History  . Marital status: Divorced    Spouse name: Roderic Palau  . Number of children: 0  . Years of education: 41  . Highest education level: Not on file  Occupational History  . Occupation: Biomedical scientist: [employer]  . Financial resource strain: Somewhat hard  . Food insecurity    Worry: Sometimes true    Inability: Sometimes true  . Transportation needs    Medical: No    Non-medical: No  Tobacco Use  . Smoking status: Never Smoker  . Smokeless tobacco: Never Used  Substance and Sexual Activity  . Alcohol use: Not Currently  . Drug use: No  . Sexual activity: Yes    Partners: Male    Birth control/protection: Condom    Comment: Pt is interested in taking BCP  Lifestyle  . Physical activity    Days per week:  0 days    Minutes per session: 0 min  . Stress: To some extent  Relationships  . Social Herbalist on phone: Three times a week    Gets together: Three times a week    Attends religious service: Never    Active member of club or organization: No    Attends meetings of clubs or organizations: Never    Relationship status: Divorced  Other Topics Concern  . Not on file  Social History Narrative   The patient is single, separated from her husband 01/2015.   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 Arthritis Mother   . Asthma Mother   . COPD Mother   . Depression Mother   . Diabetes Mother   . Hypertension Mother   . Mental illness Mother   . Kidney disease Mother   . Crohn's disease Mother   . Aneurysm Father 51       brain  . Mental illness Sister   . Arthritis Maternal Aunt   . Asthma Maternal Aunt   . COPD Maternal Aunt   . Depression Maternal Aunt   . Diabetes Maternal Aunt   . Hypertension Maternal Aunt   .  Mental illness Maternal Aunt   . Stroke Maternal Aunt   . Heart failure Maternal Aunt   . Heart failure Maternal Grandfather   . Cancer Maternal Grandfather   . Diabetes Maternal Grandfather   . Heart disease Maternal Grandfather   . Hypertension Maternal Grandfather   . Hyperlipidemia Maternal Grandfather   . Mental illness Brother   . Diabetes Maternal Grandmother   . Heart disease Maternal Grandmother   . Hypertension Maternal Grandmother   . Hyperlipidemia Maternal Grandmother   . Diabetes Paternal Grandmother   . Heart disease Paternal Grandmother   . Diabetes Paternal Grandfather   . Cancer Paternal Grandfather    - negative except otherwise stated in the family history section Allergies  Allergen Reactions  . Bee Venom Anaphylaxis, Hives, Shortness Of Breath and Swelling  . Penicillins Anaphylaxis    Has patient had a  PCN reaction causing immediate rash, facial/tongue/throat swelling, SOB or lightheadedness with hypotension: yes Has patient had a PCN reaction causing severe rash involving mucus membranes or skin necrosis: unknown Has patient had a PCN reaction that required hospitalization : yes Has patient had a PCN reaction occurring within the last 10 years: no If all of the above answers are "NO", then may proceed with Cephalosporin use.    Prior to Admission medications   Medication Sig Start Date End Date Taking? Authorizing Provider  blood glucose meter kit and supplies Dispense based on patient and insurance preference. Use up to four times daily as directed. (FOR ICD-10 E10.9, E11.9). Patient not taking: Reported on 02/01/2019 07/08/18   Murlean Iba, MD  cephALEXin (KEFLEX) 500 MG capsule Take 1 capsule (500 mg total) by mouth 3 (three) times daily. Patient not taking: Reported on 02/01/2019 01/28/19   Angelia Mould, MD  escitalopram (LEXAPRO) 10 MG tablet Take 1 tablet (10 mg total) by mouth daily. Patient not taking: Reported on 01/28/2019 09/10/18   Maximiano Coss, NP  Insulin Glargine (LANTUS SOLOSTAR) 100 UNIT/ML Solostar Pen Inject 40 Units into the skin daily. Patient not taking: Reported on 02/01/2019 07/08/18   Irwin Brakeman L, MD  insulin lispro (HUMALOG KWIKPEN) 100 UNIT/ML KwikPen Inject 0.08 mLs (8 Units total) into the skin 3 (three) times daily before meals. Patient not taking: Reported on 02/01/2019 07/08/18   Irwin Brakeman L, MD  Insulin Pen [place] 31G X 6 MM MISC 1 pen by Does not apply route as directed. Patient not taking: Reported on 02/01/2019 07/08/18   Murlean Iba, MD  sulfamethoxazole-trimethoprim (BACTRIM) 400-80 MG tablet Take 1 tablet by mouth 2 (two) times daily. Patient not taking: Reported on 02/01/2019 12/29/18   Serafina Mitchell, MD   Ct Abdomen Pelvis Wo Contrast  Result Date: 02/01/2019 CLINICAL DATA:  Initial evaluation for acute nausea,  vomiting. EXAM: CT ABDOMEN AND PELVIS WITHOUT CONTRAST TECHNIQUE: Multidetector CT imaging of the abdomen and pelvis was performed following the standard protocol without IV contrast. COMPARISON:  Prior CT from 11/25/2014. FINDINGS: Lower chest: Visualized lung bases are clear. Hepatobiliary: Liver demonstrates a normal unenhanced appearance. Gallbladder within normal limits. No biliary dilatation. Pancreas: Pancreas within normal limits. Spleen: Spleen enlarged measuring 16 cm in AP diameter, increased from previous. Adrenals/Urinary Tract: Adrenal glands within normal limits. Kidneys equal in size without evidence for nephrolithiasis or hydronephrosis. No ureterolithiasis or hydroureter. Partially distended bladder within normal limits. Stomach/Bowel: Stomach within normal limits. No evidence for bowel obstruction. Normal appendix. No acute inflammatory changes seen about the bowels. Vascular/Lymphatic: Intra-abdominal aorta of normal  caliber. Mildly enlarged left periaortic lymph node measures 14 mm (series 3, image 42). Multiple additional scattered subcentimeter shotty retroperitoneal nodes noted. In the pelvis, there is an enlarged right common iliac node measuring 11 mm (series 3, image 67). Right external iliac nodes measure up to 16 mm (series 3, image 84). Right inguinal nodes measure up to 19 mm (series 3, image 103). Findings are of uncertain etiology, but new from previous. Reproductive: Uterus and ovaries within normal limits. Other: No free air or fluid. Musculoskeletal: Small focus of soft tissue density within the subcutaneous fat of the right anterior abdomen likely reflects an injection site. No acute osseous abnormality. No discrete lytic or blastic osseous lesions. IMPRESSION: 1. Interval development of splenomegaly with multiple scattered enlarged retroperitoneal, right iliac, and right inguinal lymph nodes as above, indeterminate. While these findings may be reactive in nature, possible  lymphoproliferative disorder could also have this appearance. Correlation with laboratory values with possible histologic sampling suggested for further evaluation. 2. No other acute intra-abdominal or pelvic process. Electronically Signed   By: Jeannine Boga M.D.   On: 02/01/2019 05:06   Dg Chest 1 View  Result Date: 02/01/2019 CLINICAL DATA:  Initial evaluation for wound to right foot, history of recent surgery. EXAM: CHEST  1 VIEW COMPARISON:  Prior radiograph from 06/26/2017. FINDINGS: The cardiac and mediastinal silhouettes are stable in size and contour, and remain within normal limits. The lungs are normally inflated. No airspace consolidation, pleural effusion, or pulmonary edema. No pneumothorax. No acute osseous abnormality. IMPRESSION: No active disease. Electronically Signed   By: Jeannine Boga M.D.   On: 02/01/2019 02:24   Dg Ankle Complete Right  Result Date: 02/01/2019 CLINICAL DATA:  Wound lateral right foot. Ulcer. History of recent foot surgery. EXAM: RIGHT ANKLE - COMPLETE 3+ VIEW COMPARISON:  None. FINDINGS: Soft tissue defect about the lateral aspect of the fifth metatarsal, better assessed on concurrent foot exam. Generalized soft tissue edema about the ankle, possible patchy soft tissue air laterally. Periosteal thickening of the proximal fifth metatarsal better assessed on foot radiograph. No bony destructive change about the ankle. The ankle mortise is preserved. No fracture or dislocation. IMPRESSION: 1. Soft tissue defect about the lateral aspect of the fifth metatarsal, better assessed on concurrent foot exam. 2. Generalized soft tissue about the ankle with patchy air in the soft tissues laterally, may be related to recent surgery. Electronically Signed   By: Keith Rake M.D.   On: 02/01/2019 02:46   Mr Foot Right W Wo Contrast  Result Date: 02/01/2019 CLINICAL DATA:  Diabetic wound along the lateral aspect of the foot. Diffuse soft tissue swelling. EXAM:  MRI OF THE RIGHT FOREFOOT WITHOUT AND WITH CONTRAST TECHNIQUE: Multiplanar, multisequence MR imaging of the right foot was performed before and after the administration of intravenous contrast. CONTRAST:  60m GADAVIST GADOBUTROL 1 MMOL/ML IV SOLN COMPARISON:  Radiographs 02/01/2019 FINDINGS: There is a large open wound along the lateral plantar aspect of the foot near the base of the fifth metatarsal. There is severe diffuse subcutaneous soft tissue swelling/edema/fluid suggesting severe cellulitis. There is also scattered areas of gas throughout the soft tissues most notably along the lateral aspect of the hindfoot adjacent to the calcaneus and cuboid. This could be air related to recent surgery or from the open wound but gas-forming bacteria infection is also possible. Severe diffuse myofasciitis throughout the foot with some scattered gas noted in the short flexor muscles. Findings consistent with pyomyositis. This most notably involves  the abductor digiti minimi muscle. Diffuse abnormal signal intensity and enhancement in the fifth metatarsal, cuboid and calcaneus consistent with osteomyelitis. There is also likely involvement of the lateral cuneiform and base of the fourth metatarsal. Complex tibiotalar joint effusion. I do not see any obvious destructive bony changes but there is moderate synovial enhancement and findings are certainly worrisome for septic arthritis. I do not see any definite findings for septic arthritis involving the subtalar joints. The sinus tarsi appears normal. IMPRESSION: 1. Severe diffuse cellulitis and myofasciitis. 2. Large open wound along the lateral aspect of the foot communicating with several rim enhancing fluid collections, likely dissecting abscesses. Some of these contain gas and this is most notable around the lateral aspect of the cuboid and calcaneus. 3. Pyomyositis involving the short flexor muscles of the foot. 4. Osteomyelitis involving the fifth metatarsal, the cuboid  and calcaneus. I think is also involvement of the lateral cuneiform and base of the fourth metatarsal. 5. Complex tibiotalar joint effusion.  Septic arthritis is likely. 6. Septic tenosynovitis involving the peroneal tendons. Electronically Signed   By: Marijo Sanes M.D.   On: 02/01/2019 14:46   Dg Foot Complete Right  Result Date: 02/01/2019 CLINICAL DATA:  Wound lateral right foot. Ulcer. Recent foot surgery. EXAM: RIGHT FOOT COMPLETE - 3+ VIEW COMPARISON:  None. FINDINGS: Soft tissue defect about the lateral foot adjacent to the proximal fifth metatarsal. There is periosteal reaction at the fifth metatarsal base. No other bony destructive or periosteal change. No acute fracture. Diffuse generalized soft tissue edema about the foot and ankle. IMPRESSION: 1. Soft tissue defect about the lateral foot with subjacent periosteal reaction at the base of the fifth metatarsal, suspicious for osteomyelitis. 2. Diffuse soft tissue edema about the foot and ankle. Electronically Signed   By: Keith Rake M.D.   On: 02/01/2019 02:47   Vas [place] Lower Extremity Venous (dvt)  Result Date: 02/02/2019  Lower Venous Study Indications: Cellulitis, severe LE swelling/ warmth/ fullness.  Limitations: Depth of calf vessels due to swelling/ body habitus. Comparison Study: no prior Performing Technologist: June Leap RDMS, RVT  Examination Guidelines: A complete evaluation includes B-mode imaging, spectral Doppler, color Doppler, and power Doppler as needed of all accessible portions of each vessel. Bilateral testing is considered an integral part of a complete examination. Limited examinations for reoccurring indications may be performed as noted.  +---------+---------------+---------+-----------+----------+-------------------+ RIGHT    CompressibilityPhasicitySpontaneityPropertiesThrombus Aging      +---------+---------------+---------+-----------+----------+-------------------+ CFV      Full           Yes       Yes                                      +---------+---------------+---------+-----------+----------+-------------------+ SFJ      Full                                                             +---------+---------------+---------+-----------+----------+-------------------+ FV Prox  Full                                                             +---------+---------------+---------+-----------+----------+-------------------+  FV Mid   Full                                                             +---------+---------------+---------+-----------+----------+-------------------+ FV DistalFull                                                             +---------+---------------+---------+-----------+----------+-------------------+ PFV      Full                                                             +---------+---------------+---------+-----------+----------+-------------------+ POP      Full           Yes      Yes                                      +---------+---------------+---------+-----------+----------+-------------------+ PTV                                                   poorly visualized-                                                        no obvious thrombus +---------+---------------+---------+-----------+----------+-------------------+ PERO                                                  poorly visualized-                                                        no obvious thrombus +---------+---------------+---------+-----------+----------+-------------------+     Summary: Right: There is no evidence of deep vein thrombosis in the lower extremity. However, portions of this examination were limited- see technologist comments above. No cystic structure found in the popliteal fossa.  *See table(s) above for measurements and observations.    Preliminary    - pertinent xrays, CT, MRI studies were reviewed and  independently interpreted  Positive ROS: All other systems have been reviewed and were otherwise negative with the exception of those mentioned in the HPI and as above.  Physical Exam: General: Alert, no acute distress Psychiatric: Patient is competent for consent with normal mood and affect Lymphatic: No axillary or cervical lymphadenopathy Cardiovascular: No pedal edema  Respiratory: No cyanosis, no use of accessory musculature GI: No organomegaly, abdomen is soft and non-tender    Images:  @ENCIMAGES @  Labs:  Lab Results  Component Value Date   HGBA1C 10.8 (H) 02/01/2019   HGBA1C 13.6 (H) 07/03/2018   HGBA1C 12.1 08/13/2017   ESRSEDRATE >140 (H) 02/01/2019   CRP 41.4 (H) 02/01/2019   REPTSTATUS PENDING 02/01/2019   GRAMSTAIN  02/01/2019    RARE WBC PRESENT, PREDOMINANTLY PMN ABUNDANT GRAM POSITIVE COCCI ABUNDANT GRAM NEGATIVE RODS    CULT  02/01/2019    CULTURE REINCUBATED FOR BETTER GROWTH Performed at [facility], [place] [address] [place],  [address]    LABORGA STAPHYLOCOCCUS AUREUS 01/23/2016    Lab Results  Component Value Date   ALBUMIN 2.0 (L) 02/01/2019   ALBUMIN 2.4 (L) 07/07/2018   ALBUMIN 3.6 08/20/2016   PREALBUMIN 5.3 (L) 02/01/2019    Neurologic: Patient does not have protective sensation bilateral lower extremities.   MUSCULOSKELETAL:   Skin: Examination patient has cellulitis and swelling of the right foot.  She has purulent drainage from the lateral wound.  Through the wound I can palpate the fifth metatarsal cuboid and calcaneus.  I cannot palpate a pulse secondary to swelling but she has capillary refill less than 3 seconds.  Review of the MRI scan shows osteomyelitis of the calcaneus cuboid and fifth metatarsal.  There is an abscess that extends up the peroneal tendons.  Patient has severe protein caloric malnutrition with an albumin of 2 and uncontrolled type 2 diabetes with a hemoglobin A1c between 10 and 13.   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  Plan: Plan: Discussed with the patient and her only option is a transtibial amputation.  Discussed the importance of proper glucose control and proper nutritional management for wound healing.  Will continue IV antibiotics through today and tomorrow and plan for transtibial amputation on Wednesday.  Risks and benefits were discussed including risk of the wound not healing with severe protein caloric malnutrition and uncontrolled type 2 diabetes.  Patient states she understands and wished to proceed with surgery.  Thank you for the consult and the opportunity to see Ms. Sheila Austin, [place] [phone] 2:21 PM
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
## 2019-02-02 NOTE — H&P (View-Only) (Signed)
ORTHOPAEDIC CONSULTATION  REQUESTING PHYSICIAN: Kinnie Feil, MD  Chief Complaint: Purulent draining abscess lateral right foot.  HPI: Sheila Austin is a 33 y.o. female who presents with chronic ulceration lateral right foot.  Patient states that it has continued to get worse.  She states she has fever and chills purulent drainage from the wound with increased pain.  Past Medical History:  Diagnosis Date  . Diabetes mellitus    uncontrolled, last A1c was 14  . E. coli sepsis ([place])   . Ectopic pregnancy   . Essential hypertension, benign 11/01/2008  . Fatty liver   . Gastroparesis   . GERD (gastroesophageal reflux disease)   . Helicobacter pylori gastritis 09/07/2011  . Migraine   . Morbid obesity with body mass index (BMI) of 40.0 to 49.9 ([place])   . Pulmonary edema   . Pyelonephritis   . Ureteral obstruction    Past Surgical History:  Procedure Laterality Date  . ESOPHAGOGASTRODUODENOSCOPY  2010  . WOUND DEBRIDEMENT Right 01/03/2019   Procedure: DEBRIDEMENT WOUND RIGHT LATERAL FOOT;  Surgeon: Angelia Mould, MD;  Location: [facility] OR;  Service: Vascular;  Laterality: Right;   Social History   Socioeconomic History  . Marital status: Divorced    Spouse name: Roderic Palau  . Number of children: 0  . Years of education: 69  . Highest education level: Not on file  Occupational History  . Occupation: Biomedical scientist: [employer]  . Financial resource strain: Somewhat hard  . Food insecurity    Worry: Sometimes true    Inability: Sometimes true  . Transportation needs    Medical: No    Non-medical: No  Tobacco Use  . Smoking status: Never Smoker  . Smokeless tobacco: Never Used  Substance and Sexual Activity  . Alcohol use: Not Currently  . Drug use: No  . Sexual activity: Yes    Partners: Male    Birth control/protection: Condom    Comment: Pt is interested in taking BCP  Lifestyle  . Physical activity    Days per week:  0 days    Minutes per session: 0 min  . Stress: To some extent  Relationships  . Social Herbalist on phone: Three times a week    Gets together: Three times a week    Attends religious service: Never    Active member of club or organization: No    Attends meetings of clubs or organizations: Never    Relationship status: Divorced  Other Topics Concern  . Not on file  Social History Narrative   The patient is single, separated from her husband 01/2015.   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 Arthritis Mother   . Asthma Mother   . COPD Mother   . Depression Mother   . Diabetes Mother   . Hypertension Mother   . Mental illness Mother   . Kidney disease Mother   . Crohn's disease Mother   . Aneurysm Father 53       brain  . Mental illness Sister   . Arthritis Maternal Aunt   . Asthma Maternal Aunt   . COPD Maternal Aunt   . Depression Maternal Aunt   . Diabetes Maternal Aunt   . Hypertension Maternal Aunt   .  Mental illness Maternal Aunt   . Stroke Maternal Aunt   . Heart failure Maternal Aunt   . Heart failure Maternal Grandfather   . Cancer Maternal Grandfather   . Diabetes Maternal Grandfather   . Heart disease Maternal Grandfather   . Hypertension Maternal Grandfather   . Hyperlipidemia Maternal Grandfather   . Mental illness Brother   . Diabetes Maternal Grandmother   . Heart disease Maternal Grandmother   . Hypertension Maternal Grandmother   . Hyperlipidemia Maternal Grandmother   . Diabetes Paternal Grandmother   . Heart disease Paternal Grandmother   . Diabetes Paternal Grandfather   . Cancer Paternal Grandfather    - negative except otherwise stated in the family history section Allergies  Allergen Reactions  . Bee Venom Anaphylaxis, Hives, Shortness Of Breath and Swelling  . Penicillins Anaphylaxis    Has patient had a  PCN reaction causing immediate rash, facial/tongue/throat swelling, SOB or lightheadedness with hypotension: yes Has patient had a PCN reaction causing severe rash involving mucus membranes or skin necrosis: unknown Has patient had a PCN reaction that required hospitalization : yes Has patient had a PCN reaction occurring within the last 10 years: no If all of the above answers are "NO", then may proceed with Cephalosporin use.    Prior to Admission medications   Medication Sig Start Date End Date Taking? Authorizing Provider  blood glucose meter kit and supplies Dispense based on patient and insurance preference. Use up to four times daily as directed. (FOR ICD-10 E10.9, E11.9). Patient not taking: Reported on 02/01/2019 07/08/18   Murlean Iba, MD  cephALEXin (KEFLEX) 500 MG capsule Take 1 capsule (500 mg total) by mouth 3 (three) times daily. Patient not taking: Reported on 02/01/2019 01/28/19   Angelia Mould, MD  escitalopram (LEXAPRO) 10 MG tablet Take 1 tablet (10 mg total) by mouth daily. Patient not taking: Reported on 01/28/2019 09/10/18   Maximiano Coss, NP  Insulin Glargine (LANTUS SOLOSTAR) 100 UNIT/ML Solostar Pen Inject 40 Units into the skin daily. Patient not taking: Reported on 02/01/2019 07/08/18   Irwin Brakeman L, MD  insulin lispro (HUMALOG KWIKPEN) 100 UNIT/ML KwikPen Inject 0.08 mLs (8 Units total) into the skin 3 (three) times daily before meals. Patient not taking: Reported on 02/01/2019 07/08/18   Irwin Brakeman L, MD  Insulin Pen Needle 31G X 6 MM MISC 1 pen by Does not apply route as directed. Patient not taking: Reported on 02/01/2019 07/08/18   Murlean Iba, MD  sulfamethoxazole-trimethoprim (BACTRIM) 400-80 MG tablet Take 1 tablet by mouth 2 (two) times daily. Patient not taking: Reported on 02/01/2019 12/29/18   Serafina Mitchell, MD   Ct Abdomen Pelvis Wo Contrast  Result Date: 02/01/2019 CLINICAL DATA:  Initial evaluation for acute nausea,  vomiting. EXAM: CT ABDOMEN AND PELVIS WITHOUT CONTRAST TECHNIQUE: Multidetector CT imaging of the abdomen and pelvis was performed following the standard protocol without IV contrast. COMPARISON:  Prior CT from 11/25/2014. FINDINGS: Lower chest: Visualized lung bases are clear. Hepatobiliary: Liver demonstrates a normal unenhanced appearance. Gallbladder within normal limits. No biliary dilatation. Pancreas: Pancreas within normal limits. Spleen: Spleen enlarged measuring 16 cm in AP diameter, increased from previous. Adrenals/Urinary Tract: Adrenal glands within normal limits. Kidneys equal in size without evidence for nephrolithiasis or hydronephrosis. No ureterolithiasis or hydroureter. Partially distended bladder within normal limits. Stomach/Bowel: Stomach within normal limits. No evidence for bowel obstruction. Normal appendix. No acute inflammatory changes seen about the bowels. Vascular/Lymphatic: Intra-abdominal aorta of normal  caliber. Mildly enlarged left periaortic lymph node measures 14 mm (series 3, image 42). Multiple additional scattered subcentimeter shotty retroperitoneal nodes noted. In the pelvis, there is an enlarged right common iliac node measuring 11 mm (series 3, image 67). Right external iliac nodes measure up to 16 mm (series 3, image 84). Right inguinal nodes measure up to 19 mm (series 3, image 103). Findings are of uncertain etiology, but new from previous. Reproductive: Uterus and ovaries within normal limits. Other: No free air or fluid. Musculoskeletal: Small focus of soft tissue density within the subcutaneous fat of the right anterior abdomen likely reflects an injection site. No acute osseous abnormality. No discrete lytic or blastic osseous lesions. IMPRESSION: 1. Interval development of splenomegaly with multiple scattered enlarged retroperitoneal, right iliac, and right inguinal lymph nodes as above, indeterminate. While these findings may be reactive in nature, possible  lymphoproliferative disorder could also have this appearance. Correlation with laboratory values with possible histologic sampling suggested for further evaluation. 2. No other acute intra-abdominal or pelvic process. Electronically Signed   By: Jeannine Boga M.D.   On: 02/01/2019 05:06   Dg Chest 1 View  Result Date: 02/01/2019 CLINICAL DATA:  Initial evaluation for wound to right foot, history of recent surgery. EXAM: CHEST  1 VIEW COMPARISON:  Prior radiograph from 06/26/2017. FINDINGS: The cardiac and mediastinal silhouettes are stable in size and contour, and remain within normal limits. The lungs are normally inflated. No airspace consolidation, pleural effusion, or pulmonary edema. No pneumothorax. No acute osseous abnormality. IMPRESSION: No active disease. Electronically Signed   By: Jeannine Boga M.D.   On: 02/01/2019 02:24   Dg Ankle Complete Right  Result Date: 02/01/2019 CLINICAL DATA:  Wound lateral right foot. Ulcer. History of recent foot surgery. EXAM: RIGHT ANKLE - COMPLETE 3+ VIEW COMPARISON:  None. FINDINGS: Soft tissue defect about the lateral aspect of the fifth metatarsal, better assessed on concurrent foot exam. Generalized soft tissue edema about the ankle, possible patchy soft tissue air laterally. Periosteal thickening of the proximal fifth metatarsal better assessed on foot radiograph. No bony destructive change about the ankle. The ankle mortise is preserved. No fracture or dislocation. IMPRESSION: 1. Soft tissue defect about the lateral aspect of the fifth metatarsal, better assessed on concurrent foot exam. 2. Generalized soft tissue about the ankle with patchy air in the soft tissues laterally, may be related to recent surgery. Electronically Signed   By: Keith Rake M.D.   On: 02/01/2019 02:46   Mr Foot Right W Wo Contrast  Result Date: 02/01/2019 CLINICAL DATA:  Diabetic wound along the lateral aspect of the foot. Diffuse soft tissue swelling. EXAM:  MRI OF THE RIGHT FOREFOOT WITHOUT AND WITH CONTRAST TECHNIQUE: Multiplanar, multisequence MR imaging of the right foot was performed before and after the administration of intravenous contrast. CONTRAST:  81m GADAVIST GADOBUTROL 1 MMOL/ML IV SOLN COMPARISON:  Radiographs 02/01/2019 FINDINGS: There is a large open wound along the lateral plantar aspect of the foot near the base of the fifth metatarsal. There is severe diffuse subcutaneous soft tissue swelling/edema/fluid suggesting severe cellulitis. There is also scattered areas of gas throughout the soft tissues most notably along the lateral aspect of the hindfoot adjacent to the calcaneus and cuboid. This could be air related to recent surgery or from the open wound but gas-forming bacteria infection is also possible. Severe diffuse myofasciitis throughout the foot with some scattered gas noted in the short flexor muscles. Findings consistent with pyomyositis. This most notably involves  the abductor digiti minimi muscle. Diffuse abnormal signal intensity and enhancement in the fifth metatarsal, cuboid and calcaneus consistent with osteomyelitis. There is also likely involvement of the lateral cuneiform and base of the fourth metatarsal. Complex tibiotalar joint effusion. I do not see any obvious destructive bony changes but there is moderate synovial enhancement and findings are certainly worrisome for septic arthritis. I do not see any definite findings for septic arthritis involving the subtalar joints. The sinus tarsi appears normal. IMPRESSION: 1. Severe diffuse cellulitis and myofasciitis. 2. Large open wound along the lateral aspect of the foot communicating with several rim enhancing fluid collections, likely dissecting abscesses. Some of these contain gas and this is most notable around the lateral aspect of the cuboid and calcaneus. 3. Pyomyositis involving the short flexor muscles of the foot. 4. Osteomyelitis involving the fifth metatarsal, the cuboid  and calcaneus. I think is also involvement of the lateral cuneiform and base of the fourth metatarsal. 5. Complex tibiotalar joint effusion.  Septic arthritis is likely. 6. Septic tenosynovitis involving the peroneal tendons. Electronically Signed   By: Marijo Sanes M.D.   On: 02/01/2019 14:46   Dg Foot Complete Right  Result Date: 02/01/2019 CLINICAL DATA:  Wound lateral right foot. Ulcer. Recent foot surgery. EXAM: RIGHT FOOT COMPLETE - 3+ VIEW COMPARISON:  None. FINDINGS: Soft tissue defect about the lateral foot adjacent to the proximal fifth metatarsal. There is periosteal reaction at the fifth metatarsal base. No other bony destructive or periosteal change. No acute fracture. Diffuse generalized soft tissue edema about the foot and ankle. IMPRESSION: 1. Soft tissue defect about the lateral foot with subjacent periosteal reaction at the base of the fifth metatarsal, suspicious for osteomyelitis. 2. Diffuse soft tissue edema about the foot and ankle. Electronically Signed   By: Keith Rake M.D.   On: 02/01/2019 02:47   Vas [place] Lower Extremity Venous (dvt)  Result Date: 02/02/2019  Lower Venous Study Indications: Cellulitis, severe LE swelling/ warmth/ fullness.  Limitations: Depth of calf vessels due to swelling/ body habitus. Comparison Study: no prior Performing Technologist: June Leap RDMS, RVT  Examination Guidelines: A complete evaluation includes B-mode imaging, spectral Doppler, color Doppler, and power Doppler as needed of all accessible portions of each vessel. Bilateral testing is considered an integral part of a complete examination. Limited examinations for reoccurring indications may be performed as noted.  +---------+---------------+---------+-----------+----------+-------------------+ RIGHT    CompressibilityPhasicitySpontaneityPropertiesThrombus Aging      +---------+---------------+---------+-----------+----------+-------------------+ CFV      Full           Yes       Yes                                      +---------+---------------+---------+-----------+----------+-------------------+ SFJ      Full                                                             +---------+---------------+---------+-----------+----------+-------------------+ FV Prox  Full                                                             +---------+---------------+---------+-----------+----------+-------------------+  FV Mid   Full                                                             +---------+---------------+---------+-----------+----------+-------------------+ FV DistalFull                                                             +---------+---------------+---------+-----------+----------+-------------------+ PFV      Full                                                             +---------+---------------+---------+-----------+----------+-------------------+ POP      Full           Yes      Yes                                      +---------+---------------+---------+-----------+----------+-------------------+ PTV                                                   poorly visualized-                                                        no obvious thrombus +---------+---------------+---------+-----------+----------+-------------------+ PERO                                                  poorly visualized-                                                        no obvious thrombus +---------+---------------+---------+-----------+----------+-------------------+     Summary: Right: There is no evidence of deep vein thrombosis in the lower extremity. However, portions of this examination were limited- see technologist comments above. No cystic structure found in the popliteal fossa.  *See table(s) above for measurements and observations.    Preliminary    - pertinent xrays, CT, MRI studies were reviewed and  independently interpreted  Positive ROS: All other systems have been reviewed and were otherwise negative with the exception of those mentioned in the HPI and as above.  Physical Exam: General: Alert, no acute distress Psychiatric: Patient is competent for consent with normal mood and affect Lymphatic: No axillary or cervical lymphadenopathy Cardiovascular: No pedal edema  Respiratory: No cyanosis, no use of accessory musculature GI: No organomegaly, abdomen is soft and non-tender    Images:  @ENCIMAGES @  Labs:  Lab Results  Component Value Date   HGBA1C 10.8 (H) 02/01/2019   HGBA1C 13.6 (H) 07/03/2018   HGBA1C 12.1 08/13/2017   ESRSEDRATE >140 (H) 02/01/2019   CRP 41.4 (H) 02/01/2019   REPTSTATUS PENDING 02/01/2019   GRAMSTAIN  02/01/2019    RARE WBC PRESENT, PREDOMINANTLY PMN ABUNDANT GRAM POSITIVE COCCI ABUNDANT GRAM NEGATIVE RODS    CULT  02/01/2019    CULTURE REINCUBATED FOR BETTER GROWTH Performed at [facility], [place] [address] [place], [place] [address]    LABORGA STAPHYLOCOCCUS AUREUS 01/23/2016    Lab Results  Component Value Date   ALBUMIN 2.0 (L) 02/01/2019   ALBUMIN 2.4 (L) 07/07/2018   ALBUMIN 3.6 08/20/2016   PREALBUMIN 5.3 (L) 02/01/2019    Neurologic: Patient does not have protective sensation bilateral lower extremities.   MUSCULOSKELETAL:   Skin: Examination patient has cellulitis and swelling of the right foot.  She has purulent drainage from the lateral wound.  Through the wound I can palpate the fifth metatarsal cuboid and calcaneus.  I cannot palpate a pulse secondary to swelling but she has capillary refill less than 3 seconds.  Review of the MRI scan shows osteomyelitis of the calcaneus cuboid and fifth metatarsal.  There is an abscess that extends up the peroneal tendons.  Patient has severe protein caloric malnutrition with an albumin of 2 and uncontrolled type 2 diabetes with a hemoglobin A1c between 10 and 13.   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  Plan: Plan: Discussed with the patient and her only option is a transtibial amputation.  Discussed the importance of proper glucose control and proper nutritional management for wound healing.  Will continue IV antibiotics through today and tomorrow and plan for transtibial amputation on Wednesday.  Risks and benefits were discussed including risk of the wound not healing with severe protein caloric malnutrition and uncontrolled type 2 diabetes.  Patient states she understands and wished to proceed with surgery.  Thank you for the consult and the opportunity to see Ms. Doree Barthel, [place] [phone] 2:21 PM

## 2019-02-02 NOTE — Progress Notes (Addendum)
   Vital Signs MEWS/VS Documentation      02/02/2019 1231 02/02/2019 1900 02/02/2019 1950 02/02/2019 2133   MEWS Score:  0  2  3  4    MEWS Score Color:  Green  Yellow  Yellow  Red   Resp:  18  -  17  20   Pulse:  96  -  (!) 120  (!) 114   BP:  (!) 151/86  -  (!) 150/89  (!) 114/102   Temp:  99.9 F (37.7 C)  -  (!) 101 F (38.3 C)  (!) 101.9 F (38.8 C)   O2 Device:  Room Air  -  Room Air  Room Air     Patient MEWS alerted red. Patient is currently in bed resting. Has been crying since MD told her about her surgery that she had. Reassured patient & made her feel as comfortable as I could. MD notified about recent vitals, waiting on response. Rapid also notified on update. Will continue to monitor.       Damareon Lanni 02/02/2019,9:44 PM

## 2019-02-02 NOTE — Progress Notes (Signed)
RLE venous duplex       has been completed. Preliminary results can be found under CV proc through chart review. Paris Hohn, BS, RDMS, RVT   

## 2019-02-02 NOTE — Progress Notes (Signed)
Initial Nutrition Assessment  DOCUMENTATION CODES:   Morbid obesity  INTERVENTION:  Provide Glucerna Shake po once daily, each supplement provides 220 kcal and 10 grams of protein  Provide 30 ml Prostat po BID, each supplement provides 100 kcal and 15 grams of protein.   Encourage adequate PO intake.   NUTRITION DIAGNOSIS:   Increased nutrient needs related to wound healing as evidenced by estimated needs.  GOAL:   Patient will meet greater than or equal to 90% of their needs  MONITOR:   PO intake, Supplement acceptance, Skin, Weight trends, Labs, I & O's  REASON FOR ASSESSMENT:   Consult Wound healing  ASSESSMENT:   33 y.o. female with medical history significant of morbid obesity (BMI 45); uncontrolled DM; h/o E coli sepsis; and HTN, non compliance  presenting with diabetic foot infection. Sepsis due to cellulitis associated with right foot ulcer, severe infection, osteomyelitis. MRI: severe cellulitis, myofasciitis, dissecting abscesses, pyomyositis, Osteomyelitis involving the fifth metatarsal, the cuboid and Calcaneus, septic arthritis   Diet has just been advanced. Pt with severe pain during time of visit, however reports she has able to eat well PTA with no difficulties. Pt with no significant weight loss per weight records. RD to order nutritional supplements to aid in wound healing. Labs and medications reviewed.   NUTRITION - FOCUSED PHYSICAL EXAM:    Most Recent Value  Orbital Region  No depletion  Upper Arm Region  No depletion  Thoracic and Lumbar Region  No depletion  Buccal Region  No depletion  Temple Region  No depletion  Clavicle Bone Region  No depletion  Clavicle and Acromion Bone Region  No depletion  Scapular Bone Region  No depletion  Dorsal Hand  No depletion  Patellar Region  No depletion  Anterior Thigh Region  No depletion  Posterior Calf Region  No depletion  Edema (RD Assessment)  Mild  Hair  Reviewed  Eyes  Reviewed  Mouth  Reviewed   Skin  Reviewed  Nails  Reviewed       Diet Order:   Diet Order            Diet Carb Modified Fluid consistency: Thin; Room service appropriate? Yes  Diet effective now              EDUCATION NEEDS:   Not appropriate for education at this time  Skin:  Skin Assessment: Reviewed RN Assessment  Last BM:  9/26  Height:   Ht Readings from Last 1 Encounters:  01/28/19 5\' 2"  (1.575 m)    Weight:   Wt Readings from Last 1 Encounters:  02/01/19 112.5 kg    Ideal Body Weight:  50 kg  BMI:  Body mass index is 45.36 kg/m.  Estimated Nutritional Needs:   Kcal:  [id number]  Protein:  100-115 grams  Fluid:  >/= 1.8 L/day    Corrin Parker, MS, RD, LDN Pager # [phone] After hours/ weekend pager # [phone]

## 2019-02-02 NOTE — Consult Note (Signed)
WOC consult requested for RLE. Reviewed progress notes in the EMR.  Pt is being followed by vascular team for right full thickness wound. They have ordered moist gauze dressings daily.  Progress notes indicate: "pt does have palpable DP on the right.  I have asked the ER RN to get a wound cx and start the wet to dry dressings back once Dr. Donnetta Hutching has evaluated the pt.  She does have a suspicion for osteomyelitis on her plain xray.  May need consult with Dr. Sharol Given." Please refer to vascular team for further questions regarding plan of care. Please re-consult if further assistance is needed.  Thank-you,  Julien Girt MSN, [place], [place], [place], [place]

## 2019-02-02 NOTE — Progress Notes (Addendum)
Inpatient Diabetes Program Recommendations  AACE/ADA: New Consensus Statement on Inpatient Glycemic Control (2015)  Target Ranges:  Prepandial:   less than 140 mg/dL      Peak postprandial:   less than 180 mg/dL (1-2 hours)      Critically ill patients:  140 - 180 mg/dL   Lab Results  Component Value Date   GLUCAP 227 (H) 02/02/2019   HGBA1C 10.8 (H) 02/01/2019    Review of Glycemic Control Results for KINDLE, [place] (MRN [id number]) as of 02/02/2019 13:44  Ref. Range 02/01/2019 21:17 02/02/2019 06:26 02/02/2019 13:10  Glucose-Capillary Latest Ref Range: 70 - 99 mg/dL 247 (H) 341 (H) 227 (H)   Diabetes history: DM2 Outpatient Diabetes medications: Lantus 40 units (note states not taking) + Humalog 8 units tid meal coverage not taking Current orders for Inpatient glycemic control: Lantus 10 units daily + Novolog resistant correction tid + hs 0-5 units  A1c 10.8 (down from 13.6 on previous admission in March 2020)  Inpatient Diabetes Program Recommendations:   -Increase Lantus to 25 units daily -Add Novolog 4 units tid meal coverage if eats 50% (when eating)  Thank you, Bethena Roys E. Kamali Nephew, RN, MSN, CDE  Diabetes Coordinator Inpatient Glycemic Control Team Team Pager [phone] (8am-5pm) 02/02/2019 1:50 PM

## 2019-02-02 NOTE — Plan of Care (Signed)

## 2019-02-02 NOTE — Progress Notes (Signed)
   VASCULAR SURGERY ASSESSMENT & PLAN:   DIABETIC FOOT INFECTION RIGHT FOOT: This patient was admitted to the medical service with a diabetic foot infection of the right foot.  She has normal arterial flow.  She is admitted for intravenous antibiotics.  She has an extensive wound on the lateral aspect of her right foot.  Her MRI of the right foot yesterday showed extensive cellulitis and mild fasciitis of the right foot.  She also had some pyomyositis and osteomyelitis involving the fifth metatarsal cuboid and calcaneus.  I have consulted Dr. Sharol Given for further management of her right foot.  I have ordered a venous duplex of the right lower extremity given her complaints of heaviness in the right leg and significant right lower extremity swelling.  I have also written to elevate the leg.  SUBJECTIVE:   Complains of heaviness in her right leg and pain in the right foot  PHYSICAL EXAM:   Vitals:   02/02/19 0012 02/02/19 0230 02/02/19 0631 02/02/19 0755  BP: (!) 154/80 (!) 166/96 (!) 149/81 (!) 182/91  Pulse: (!) 117 (!) 113 (!) 108 (!) 105  Resp:    18  Temp: 98.3 F (36.8 C) 100 F (37.8 C) 99.4 F (37.4 C) 100.2 F (37.9 C)  TempSrc: Oral Oral Oral Oral  SpO2: 94% 96%  98%  Weight:       Significant right lower extremity swelling.  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 docusate sodium  100 mg Oral BID  . heparin  5,000 Units Subcutaneous Q8H  . insulin aspart  0-20 Units Subcutaneous TID WC  . insulin aspart  0-5 Units Subcutaneous QHS  . insulin glargine  10 Units Subcutaneous Daily  . metoCLOPramide (REGLAN) injection  5 mg Intravenous Q6H  .  morphine injection  2 mg Intravenous Once  . sodium chloride flush  3 mL Intravenous Q12H    [name] Beeper: [id number] Office: [phone] 02/02/2019

## 2019-02-02 NOTE — Progress Notes (Signed)
Notified [facility] of patient's CBG 341 and request for IV pain medication.   Patient's pain level 8/10  Awaiting response.

## 2019-02-03 ENCOUNTER — Other Ambulatory Visit: Payer: Self-pay | Admitting: Family

## 2019-02-03 LAB — GLUCOSE, CAPILLARY
Glucose-Capillary: 165 mg/dL — ABNORMAL HIGH (ref 70–99)
Glucose-Capillary: 156 mg/dL — ABNORMAL HIGH (ref 70–99)
Glucose-Capillary: 118 mg/dL — ABNORMAL HIGH (ref 70–99)
Glucose-Capillary: 152 mg/dL — ABNORMAL HIGH (ref 70–99)

## 2019-02-03 LAB — CBC
HCT: 23.2 % — ABNORMAL LOW (ref 36.0–46.0)
Hemoglobin: 7.4 g/dL — ABNORMAL LOW (ref 12.0–15.0)
MCH: 27.5 pg (ref 26.0–34.0)
MCHC: 31.9 g/dL (ref 30.0–36.0)
MCV: 86.2 fL (ref 80.0–100.0)
Platelets: 380 10*3/uL (ref 150–400)
RBC: 2.69 MIL/uL — ABNORMAL LOW (ref 3.87–5.11)
RDW: 13.2 % (ref 11.5–15.5)
WBC: 26.5 10*3/uL — ABNORMAL HIGH (ref 4.0–10.5)
nRBC: 0 % (ref 0.0–0.2)

## 2019-02-03 LAB — BLOOD CULTURE ID PANEL (REFLEXED)

## 2019-02-03 LAB — HEMOGLOBIN AND HEMATOCRIT, BLOOD
HCT: 25.5 % — ABNORMAL LOW (ref 36.0–46.0)
Hemoglobin: 8.4 g/dL — ABNORMAL LOW (ref 12.0–15.0)

## 2019-02-03 LAB — BASIC METABOLIC PANEL
Anion gap: 10 (ref 5–15)
BUN: 21 mg/dL — ABNORMAL HIGH (ref 6–20)
CO2: 25 mmol/L (ref 22–32)
Calcium: 7.2 mg/dL — ABNORMAL LOW (ref 8.9–10.3)
Chloride: 98 mmol/L (ref 98–111)
Creatinine, Ser: 1.13 mg/dL — ABNORMAL HIGH (ref 0.44–1.00)
GFR calc Af Amer: >60 mL/min (ref >60)
GFR calc non Af Amer: >60 mL/min (ref >60)
Glucose, Bld: 182 mg/dL — ABNORMAL HIGH (ref 70–99)
Potassium: 3.3 mmol/L — ABNORMAL LOW (ref 3.5–5.1)
Sodium: 133 mmol/L — ABNORMAL LOW (ref 135–145)

## 2019-02-03 LAB — PREPARE RBC (CROSSMATCH)

## 2019-02-03 LAB — ABO/RH: ABO/RH(D): A POS

## 2019-02-03 MED ORDER — SODIUM CHLORIDE 0.9 % IV SOLN
INTRAVENOUS | Status: AC
  Administered 2019-02-03: 12:00:00 via INTRAVENOUS

## 2019-02-03 MED ORDER — VANCOMYCIN HCL 10 G IV SOLR
1500.0000 mg | INTRAVENOUS | Status: DC
  Administered 2019-02-03 – 2019-02-04 (×2): 1500 mg via INTRAVENOUS
  Filled 2019-02-03 (×3): qty 1500

## 2019-02-03 MED ORDER — CHLORHEXIDINE GLUCONATE 4 % EX LIQD
60.0000 mL | Freq: Once | CUTANEOUS | Status: AC
  Administered 2019-02-03: 4 via TOPICAL
  Filled 2019-02-03: qty 15

## 2019-02-03 MED ORDER — SODIUM CHLORIDE 0.9% IV SOLUTION: Freq: Once | INTRAVENOUS | Status: DC

## 2019-02-03 MED ORDER — POTASSIUM CHLORIDE CRYS ER 20 MEQ PO TBCR
40.0000 meq | EXTENDED_RELEASE_TABLET | Freq: Every day | ORAL | Status: AC
  Administered 2019-02-03: 40 meq via ORAL
  Filled 2019-02-03 (×2): qty 2

## 2019-02-03 NOTE — Plan of Care (Signed)
  Problem: Coping: Goal: Level of anxiety will decrease Outcome: Progressing   Problem: Pain Managment: Goal: General experience of comfort will improve Outcome: Progressing   Problem: Safety: Goal: Ability to remain free from injury will improve Outcome: Progressing   

## 2019-02-03 NOTE — Plan of Care (Signed)
  Problem: Education: Goal: Knowledge of General Education information will improve Description: Including pain rating scale, medication(s)/side effects and non-pharmacologic comfort measures Outcome: Progressing   Problem: Clinical Measurements: Goal: Ability to maintain clinical measurements within normal limits will improve Outcome: Progressing Goal: Will remain free from infection Outcome: Progressing   Problem: Activity: Goal: Risk for activity intolerance will decrease Outcome: Progressing   Problem: Coping: Goal: Level of anxiety will decrease Outcome: Progressing   Problem: Pain Managment: Goal: General experience of comfort will improve Outcome: Progressing   Problem: Safety: Goal: Ability to remain free from injury will improve Outcome: Progressing

## 2019-02-03 NOTE — Progress Notes (Signed)
   VASCULAR SURGERY ASSESSMENT & PLAN:   DIABETIC FOOT INFECTION RIGHT FOOT: Discussed the case with Dr. Sharol Given.  Given the extent of the osteomyelitis and infection there is really no way to salvage the foot and the patient is scheduled for a right below the knee amputation tomorrow.  She had no significant peripheral vascular disease so should not have any problems healing from a vascular standpoint.  Certainly her diabetes has been the primary issue.  Vascular surgery will be available as needed.   SUBJECTIVE:   Understands the necessity for a right below the knee amputation tomorrow.  PHYSICAL EXAM:   Vitals:   02/03/19 0741 02/03/19 1430 02/03/19 1453 02/03/19 1705  BP: 139/81 (!) 152/94 (!) 141/94 (!) 144/96  Pulse: (!) 110 (!) 110 (!) 102 (!) 105  Resp: 15     Temp: 99.9 F (37.7 C) 99.6 F (37.6 C) 99.4 F (37.4 C) 99 F (37.2 C)  TempSrc: Oral Oral Oral Oral  SpO2: 90% 98% 97% 97%  Weight:       Dressing with minimal drainage.  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 sodium chloride   Intravenous Once  . acetaminophen  500 mg Oral TID  . docusate sodium  100 mg Oral BID  . feeding supplement (GLUCERNA  SHAKE)  237 mL Oral Q1500  . feeding supplement (PRO-STAT SUGAR FREE 64)  30 mL Oral BID  . heparin  5,000 Units Subcutaneous Q8H  . insulin aspart  0-20 Units Subcutaneous TID WC  . insulin aspart  0-5 Units Subcutaneous QHS  . insulin glargine  25 Units Subcutaneous Daily  . metoCLOPramide (REGLAN) injection  5 mg Intravenous Q6H  . pantoprazole  40 mg Oral Daily  . potassium chloride  40 mEq Oral Daily  . sodium chloride flush  3 mL Intravenous Q12H    [name] Beeper: [id number] Office: [phone] 02/03/2019

## 2019-02-03 NOTE — Progress Notes (Signed)
Pharmacy Antibiotic Note  Sheila Austin is a 33 y.o. female admitted on 01/31/2019 with diabetic foot wound.  Pharmacy has been consulted for Vancomycin/Aztreonam dosing. 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 Received and outpatient prescription for Keflex a few days PTA but did not fill it due to the allergy. WBC 35.6>>26.5. Scr down to 1.13. 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  Plan: Increase Vancomycin to 1500 mg IV q24h >>Est AUC: 515 with Scr 1.13 Aztreonam 1g IV q8h Trend WBC, temp, renal function  F/U infectious work-up Drug levels as indicated   Weight: 248 lb 0.3 oz (112.5 kg)  Temp (24hrs), Avg:100.3 F (37.9 C), Min:99.1 F (37.3 C), Max:101.9 F (38.8 C)  Recent Labs  Lab 02/01/19 0033  02/01/19 0506 02/01/19 0655 02/01/19 1020 02/01/19 1605 02/01/19 1900 02/02/19 0301 02/03/19 0546  WBC 35.6*  --   --   --   --   --   --  29.2* 26.5*  CREATININE 2.17*  --   --  1.88* 1.61*  --   --  1.40* 1.13*  LATICACIDVEN 1.7   < > 4.3* 3.0* 2.5* 1.6 2.2*  --   --    < > = values in this interval not displayed.    Estimated Creatinine Clearance: 85.5 mL/min (A) (by C-G formula based on SCr of 1.13 mg/dL (H)).    Allergies  Allergen Reactions  . Bee Venom Anaphylaxis, Hives, Shortness Of Breath and Swelling  . Penicillins Anaphylaxis    Has patient had a PCN reaction causing immediate rash, facial/tongue/throat swelling, SOB or lightheadedness with hypotension: yes Has patient had a PCN reaction causing severe rash involving mucus membranes or skin necrosis: unknown Has patient had a PCN reaction that required hospitalization : yes Has patient had a PCN reaction occurring within the last 10 years: no If all of the above answers are "NO", then may proceed with Cephalosporin use.    9/27 Vanc>>  9/27 Aztreonam>> 9/27 Flagyl>>  9/27 COVID: neg 9/27 Ucx: multiple species, suggest recollection 9/27 Wound: abundant GPC, G- rods,  reincubated for better growth 9/27 Bcx: ngtd   9/29: Scr down to 1.13, vanc inc to 1500 mg q24h , AUC [place] - Student PharmD 02/03/19 10:25 AM
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
## 2019-02-03 NOTE — Progress Notes (Signed)
PHARMACY - PHYSICIAN COMMUNICATION CRITICAL VALUE ALERT - BLOOD CULTURE IDENTIFICATION (BCID)  Results for orders placed or performed during the hospital encounter of 01/31/19  Blood Culture ID Panel (Reflexed) (Collected: 02/01/2019 12:34 AM)  Result Value Ref Range   Enterococcus species NOT DETECTED NOT DETECTED   Listeria monocytogenes NOT DETECTED NOT DETECTED   Staphylococcus species NOT DETECTED NOT DETECTED   Staphylococcus aureus (BCID) NOT DETECTED NOT DETECTED   Streptococcus species DETECTED (A) NOT DETECTED   Streptococcus agalactiae NOT DETECTED NOT DETECTED   Streptococcus pneumoniae NOT DETECTED NOT DETECTED   Streptococcus pyogenes NOT DETECTED NOT DETECTED   Acinetobacter baumannii NOT DETECTED NOT DETECTED   Enterobacteriaceae species NOT DETECTED NOT DETECTED   Enterobacter cloacae complex NOT DETECTED NOT DETECTED   Escherichia coli NOT DETECTED NOT DETECTED   Klebsiella oxytoca NOT DETECTED NOT DETECTED   Klebsiella pneumoniae NOT DETECTED NOT DETECTED   Proteus species NOT DETECTED NOT DETECTED   Serratia marcescens NOT DETECTED NOT DETECTED   Haemophilus influenzae NOT DETECTED NOT DETECTED   Neisseria meningitidis NOT DETECTED NOT DETECTED   Pseudomonas aeruginosa NOT DETECTED NOT DETECTED   Candida albicans NOT DETECTED NOT DETECTED   Candida glabrata NOT DETECTED NOT DETECTED   Candida krusei NOT DETECTED NOT DETECTED   Candida parapsilosis NOT DETECTED NOT DETECTED   Candida tropicalis NOT DETECTED NOT DETECTED    Name of physician (or Provider) Contacted: none  Changes to prescribed antibiotics required: No changes needed.  Currently on broad spectrum ABX: Vancomycin, Aztreonam and Flagyl  Bonnita Nasuti Pharm.D. CPP, BCPS Clinical Pharmacist [phone] 02/03/2019 10:50 PM

## 2019-02-03 NOTE — Progress Notes (Signed)
Inpatient Diabetes Program Recommendations  AACE/ADA: New Consensus Statement on Inpatient Glycemic Control (2015)  Target Ranges:  Prepandial:   less than 140 mg/dL      Peak postprandial:   less than 180 mg/dL (1-2 hours)      Critically ill patients:  140 - 180 mg/dL   Lab Results  Component Value Date   GLUCAP 156 (H) 02/03/2019   HGBA1C 10.8 (H) 02/01/2019    Review of Glycemic Control Results for MILES, GADE (MRN [id number]) as of 02/03/2019 15:00  Ref. Range 02/02/2019 13:10 02/02/2019 16:06 02/02/2019 21:33 02/03/2019 06:31 02/03/2019 11:42  Glucose-Capillary Latest Ref Range: 70 - 99 mg/dL 227 (H) 195 (H) 153 (H) 165 (H) 156 (H)   Diabetes history: DM2 Outpatient Diabetes medications: Lantus 40 units (note states not taking) + Humalog 8 units tid meal coverage not taking Current orders for Inpatient glycemic control: Lantus 25 units daily + Novolog resistant correction tid + hs 0-5 units  Inpatient Diabetes Program Recommendations:   Spoke with pt about new diagnosis. Discussed A1C results with them 10.8 (average blood glucose 263 over the past 2-3 months) and explained what an A1C is, basic pathophysiology of DM Type 2, basic home care, basic diabetes diet nutrition principles, importance of checking CBGs and maintaining good CBG control to prevent long-term and short-term complications. Reviewed signs and symptoms of hyperglycemia and hypoglycemia and how to treat hypoglycemia at home. Also reviewed blood sugar goals at home.  Patient shared that she cared for her mom during her battle with cancer and when her mom passed in January that she could not focus on caring for herself. Patient has a 81 year old daughter. States "I've cared for everyone but myself". Reviewed risks involved in elevated CBGs. Patient states she had been using her mom's glucometer but she lost it. Discharge needs: Lantus SoloStar Insulin Pen (Order # [id number]) Humalog Kwikpen Insulin Pen (Order #  [id number]) Insulin Pen Needles- 31 gauge X 5 mm (Order # [id number])  CBG Meter and Supplies (Order # [id number] or [id number])  Thank you, Nani Gasser. Lyam Provencio, RN, MSN, CDE  Diabetes Coordinator Inpatient Glycemic Control Team Team Pager [phone] (8am-5pm) 02/03/2019 3:09 PM

## 2019-02-04 ENCOUNTER — Inpatient Hospital Stay (HOSPITAL_COMMUNITY): Payer: 59 | Admitting: Anesthesiology

## 2019-02-04 ENCOUNTER — Encounter (HOSPITAL_COMMUNITY): Payer: Self-pay | Admitting: *Deleted

## 2019-02-04 ENCOUNTER — Other Ambulatory Visit: Payer: Self-pay

## 2019-02-04 ENCOUNTER — Encounter (HOSPITAL_COMMUNITY)
Admission: EM | Disposition: A | Discharge status: Rehabilitation Facility | Payer: Self-pay | Source: Home / Self Care | Attending: Internal Medicine

## 2019-02-04 DIAGNOSIS — L02415 Cutaneous abscess of right lower limb: Secondary | ICD-10-CM

## 2019-02-04 HISTORY — PX: AMPUTATION: SHX166

## 2019-02-04 LAB — CBC
HCT: 25.3 % — ABNORMAL LOW (ref 36.0–46.0)
Hemoglobin: 7.9 g/dL — ABNORMAL LOW (ref 12.0–15.0)
MCH: 26.9 pg (ref 26.0–34.0)
MCHC: 31.2 g/dL (ref 30.0–36.0)
MCV: 86.1 fL (ref 80.0–100.0)
Platelets: 411 10*3/uL — ABNORMAL HIGH (ref 150–400)
RBC: 2.94 MIL/uL — ABNORMAL LOW (ref 3.87–5.11)
RDW: 13.7 % (ref 11.5–15.5)
WBC: 26.8 10*3/uL — ABNORMAL HIGH (ref 4.0–10.5)
nRBC: 0 % (ref 0.0–0.2)

## 2019-02-04 LAB — BPAM RBC
Blood Product Expiration Date: 202010212359
ISSUE DATE / TIME: 202009291422
Unit Type and Rh: 6200

## 2019-02-04 LAB — BASIC METABOLIC PANEL
Anion gap: 8 (ref 5–15)
BUN: 23 mg/dL — ABNORMAL HIGH (ref 6–20)
CO2: 23 mmol/L (ref 22–32)
Calcium: 6.9 mg/dL — ABNORMAL LOW (ref 8.9–10.3)
Chloride: 99 mmol/L (ref 98–111)
Creatinine, Ser: 1.09 mg/dL — ABNORMAL HIGH (ref 0.44–1.00)
GFR calc Af Amer: >60 mL/min (ref >60)
GFR calc non Af Amer: >60 mL/min (ref >60)
Glucose, Bld: 114 mg/dL — ABNORMAL HIGH (ref 70–99)
Potassium: 3.7 mmol/L (ref 3.5–5.1)
Sodium: 130 mmol/L — ABNORMAL LOW (ref 135–145)

## 2019-02-04 LAB — TYPE AND SCREEN
ABO/RH(D): A POS
Antibody Screen: NEGATIVE
Unit division: 0

## 2019-02-04 LAB — GLUCOSE, CAPILLARY
Glucose-Capillary: 132 mg/dL — ABNORMAL HIGH (ref 70–99)
Glucose-Capillary: 93 mg/dL (ref 70–99)
Glucose-Capillary: 106 mg/dL — ABNORMAL HIGH (ref 70–99)
Glucose-Capillary: 148 mg/dL — ABNORMAL HIGH (ref 70–99)
Glucose-Capillary: 133 mg/dL — ABNORMAL HIGH (ref 70–99)

## 2019-02-04 LAB — SURGICAL PCR SCREEN
MRSA, PCR: NEGATIVE
Staphylococcus aureus: NEGATIVE

## 2019-02-04 SURGERY — AMPUTATION BELOW KNEE
Anesthesia: General | Site: Knee | Laterality: Right

## 2019-02-04 MED ORDER — FENTANYL CITRATE (PF) 100 MCG/2ML IJ SOLN
100.0000 ug | Freq: Once | INTRAMUSCULAR | Status: AC
  Administered 2019-02-04: 11:00:00 100 ug via INTRAVENOUS

## 2019-02-04 MED ORDER — FENTANYL CITRATE (PF) 250 MCG/5ML IJ SOLN
INTRAMUSCULAR | Status: DC | PRN
  Administered 2019-02-04 (×8): 25 ug via INTRAVENOUS
  Administered 2019-02-04: 50 ug via INTRAVENOUS

## 2019-02-04 MED ORDER — MIDAZOLAM HCL 2 MG/2ML IJ SOLN
INTRAMUSCULAR | Status: AC
  Filled 2019-02-04: qty 2

## 2019-02-04 MED ORDER — MIDAZOLAM HCL 2 MG/2ML IJ SOLN
2.0000 mg | Freq: Once | INTRAMUSCULAR | Status: AC
  Administered 2019-02-04: 11:00:00 2 mg via INTRAVENOUS

## 2019-02-04 MED ORDER — METHOCARBAMOL 500 MG PO TABS
500.0000 mg | ORAL_TABLET | Freq: Four times a day (QID) | ORAL | Status: DC | PRN
  Administered 2019-02-04 – 2019-02-05 (×3): 500 mg via ORAL
  Filled 2019-02-04 (×4): qty 1

## 2019-02-04 MED ORDER — ACETAMINOPHEN 325 MG PO TABS
325.0000 mg | ORAL_TABLET | Freq: Four times a day (QID) | ORAL | Status: DC | PRN
  Administered 2019-02-06 – 2019-02-08 (×2): 650 mg via ORAL

## 2019-02-04 MED ORDER — PROPOFOL 10 MG/ML IV BOLUS
INTRAVENOUS | Status: DC | PRN
  Administered 2019-02-04: 250 mg via INTRAVENOUS

## 2019-02-04 MED ORDER — ROPIVACAINE HCL 5 MG/ML IJ SOLN
INTRAMUSCULAR | Status: DC | PRN
  Administered 2019-02-04: 20 mL via PERINEURAL

## 2019-02-04 MED ORDER — LACTATED RINGERS IV SOLN
INTRAVENOUS | Status: DC
  Administered 2019-02-04: 11:00:00 via INTRAVENOUS

## 2019-02-04 MED ORDER — FENTANYL CITRATE (PF) 100 MCG/2ML IJ SOLN
25.0000 ug | INTRAMUSCULAR | Status: DC | PRN
  Administered 2019-02-04: 50 ug via INTRAVENOUS

## 2019-02-04 MED ORDER — HYDROMORPHONE HCL 1 MG/ML IJ SOLN
0.5000 mg | INTRAMUSCULAR | Status: DC | PRN
  Administered 2019-02-04: 23:00:00 0.5 mg via INTRAVENOUS
  Administered 2019-02-04 – 2019-02-06 (×6): 1 mg via INTRAVENOUS
  Filled 2019-02-04 (×7): qty 1

## 2019-02-04 MED ORDER — FENTANYL CITRATE (PF) 100 MCG/2ML IJ SOLN
INTRAMUSCULAR | Status: AC
  Filled 2019-02-04: qty 2

## 2019-02-04 MED ORDER — DEXAMETHASONE SODIUM PHOSPHATE 10 MG/ML IJ SOLN
INTRAMUSCULAR | Status: AC
  Filled 2019-02-04: qty 1

## 2019-02-04 MED ORDER — LIDOCAINE 2% (20 MG/ML) 5 ML SYRINGE
INTRAMUSCULAR | Status: AC
  Filled 2019-02-04: qty 5

## 2019-02-04 MED ORDER — ONDANSETRON HCL 4 MG PO TABS: 4.0000 mg | ORAL_TABLET | Freq: Four times a day (QID) | ORAL | Status: DC | PRN

## 2019-02-04 MED ORDER — POLYETHYLENE GLYCOL 3350 17 G PO PACK: 17.0000 g | PACK | Freq: Every day | ORAL | Status: DC | PRN

## 2019-02-04 MED ORDER — FENTANYL CITRATE (PF) 250 MCG/5ML IJ SOLN
INTRAMUSCULAR | Status: AC
  Filled 2019-02-04: qty 5

## 2019-02-04 MED ORDER — METOCLOPRAMIDE HCL 5 MG/ML IJ SOLN: 5.0000 mg | Freq: Three times a day (TID) | INTRAMUSCULAR | Status: DC | PRN

## 2019-02-04 MED ORDER — LACTATED RINGERS IV SOLN
INTRAVENOUS | Status: DC | PRN
  Administered 2019-02-04: 10:00:00 via INTRAVENOUS

## 2019-02-04 MED ORDER — DOCUSATE SODIUM 100 MG PO CAPS: 100.0000 mg | ORAL_CAPSULE | Freq: Two times a day (BID) | ORAL | Status: DC

## 2019-02-04 MED ORDER — LIDOCAINE 2% (20 MG/ML) 5 ML SYRINGE
INTRAMUSCULAR | Status: DC | PRN
  Administered 2019-02-04: 20 mg via INTRAVENOUS

## 2019-02-04 MED ORDER — SODIUM CHLORIDE 0.9 % IV SOLN
INTRAVENOUS | Status: DC
  Administered 2019-02-04: 13:00:00 via INTRAVENOUS

## 2019-02-04 MED ORDER — MIDAZOLAM HCL 2 MG/2ML IJ SOLN
INTRAMUSCULAR | Status: DC | PRN
  Administered 2019-02-04 (×2): 1 mg via INTRAVENOUS

## 2019-02-04 MED ORDER — SODIUM CHLORIDE 0.9 % IV SOLN
INTRAVENOUS | Status: DC
  Administered 2019-02-08: 07:00:00 via INTRAVENOUS

## 2019-02-04 MED ORDER — OXYCODONE HCL 5 MG PO TABS
5.0000 mg | ORAL_TABLET | ORAL | Status: DC | PRN
  Administered 2019-02-08: 10 mg via ORAL
  Administered 2019-02-09: 5 mg via ORAL
  Administered 2019-02-09: 10 mg via ORAL
  Filled 2019-02-04: qty 2
  Filled 2019-02-04: qty 1
  Filled 2019-02-04 (×4): qty 2

## 2019-02-04 MED ORDER — PHENYLEPHRINE 40 MCG/ML (10ML) SYRINGE FOR IV PUSH (FOR BLOOD PRESSURE SUPPORT)
PREFILLED_SYRINGE | INTRAVENOUS | Status: AC
  Filled 2019-02-04: qty 10

## 2019-02-04 MED ORDER — METOCLOPRAMIDE HCL 5 MG PO TABS: 5.0000 mg | ORAL_TABLET | Freq: Three times a day (TID) | ORAL | Status: DC | PRN

## 2019-02-04 MED ORDER — PROMETHAZINE HCL 25 MG/ML IJ SOLN: 6.2500 mg | INTRAMUSCULAR | Status: DC | PRN

## 2019-02-04 MED ORDER — MAGNESIUM CITRATE PO SOLN: 1.0000 | Freq: Once | ORAL | Status: DC | PRN

## 2019-02-04 MED ORDER — ONDANSETRON HCL 4 MG/2ML IJ SOLN
INTRAMUSCULAR | Status: DC | PRN
  Administered 2019-02-04: 4 mg via INTRAVENOUS

## 2019-02-04 MED ORDER — OXYCODONE HCL 5 MG PO TABS
10.0000 mg | ORAL_TABLET | ORAL | Status: DC | PRN
  Administered 2019-02-04 – 2019-02-05 (×4): 15 mg via ORAL
  Administered 2019-02-05: 10 mg via ORAL
  Administered 2019-02-05: 15 mg via ORAL
  Administered 2019-02-05: 10 mg via ORAL
  Administered 2019-02-06 (×4): 15 mg via ORAL
  Administered 2019-02-06: 10 mg via ORAL
  Administered 2019-02-07 (×3): 15 mg via ORAL
  Administered 2019-02-08 – 2019-02-09 (×3): 10 mg via ORAL
  Filled 2019-02-04: qty 3
  Filled 2019-02-04: qty 2
  Filled 2019-02-04 (×3): qty 3
  Filled 2019-02-04: qty 2
  Filled 2019-02-04 (×3): qty 3
  Filled 2019-02-04: qty 2
  Filled 2019-02-04: qty 3
  Filled 2019-02-04: qty 2
  Filled 2019-02-04 (×4): qty 3

## 2019-02-04 MED ORDER — ONDANSETRON HCL 4 MG/2ML IJ SOLN
INTRAMUSCULAR | Status: AC
  Filled 2019-02-04: qty 2

## 2019-02-04 MED ORDER — ONDANSETRON HCL 4 MG/2ML IJ SOLN: 4.0000 mg | Freq: Four times a day (QID) | INTRAMUSCULAR | Status: DC | PRN

## 2019-02-04 MED ORDER — ACETAMINOPHEN 10 MG/ML IV SOLN
INTRAVENOUS | Status: DC | PRN
  Administered 2019-02-04: 1000 mg via INTRAVENOUS

## 2019-02-04 MED ORDER — PROPOFOL 10 MG/ML IV BOLUS
INTRAVENOUS | Status: AC
  Filled 2019-02-04: qty 20

## 2019-02-04 MED ORDER — MIDAZOLAM HCL 2 MG/2ML IJ SOLN
INTRAMUSCULAR | Status: AC
  Administered 2019-02-04: 2 mg via INTRAVENOUS
  Filled 2019-02-04: qty 2

## 2019-02-04 MED ORDER — METHOCARBAMOL 1000 MG/10ML IJ SOLN
500.0000 mg | Freq: Four times a day (QID) | INTRAVENOUS | Status: DC | PRN
  Filled 2019-02-04: qty 5

## 2019-02-04 MED ORDER — BUPIVACAINE-EPINEPHRINE (PF) 0.5% -1:200000 IJ SOLN
INTRAMUSCULAR | Status: DC | PRN
  Administered 2019-02-04: 30 mL via PERINEURAL

## 2019-02-04 MED ORDER — BISACODYL 10 MG RE SUPP: 10.0000 mg | Freq: Every day | RECTAL | Status: DC | PRN

## 2019-02-04 MED ORDER — ACETAMINOPHEN 10 MG/ML IV SOLN
INTRAVENOUS | Status: AC
  Filled 2019-02-04: qty 100

## 2019-02-04 MED ORDER — FENTANYL CITRATE (PF) 100 MCG/2ML IJ SOLN
INTRAMUSCULAR | Status: AC
  Administered 2019-02-04: 100 ug via INTRAVENOUS
  Filled 2019-02-04: qty 2

## 2019-02-04 MED ORDER — SUCCINYLCHOLINE CHLORIDE 200 MG/10ML IV SOSY
PREFILLED_SYRINGE | INTRAVENOUS | Status: DC | PRN
  Administered 2019-02-04: 120 mg via INTRAVENOUS

## 2019-02-04 SURGICAL SUPPLY — 42 items
BLADE SAW RECIP 87.9 MT (BLADE) ×2 IMPLANT
BLADE SURG 21 STRL SS (BLADE) ×2 IMPLANT
BNDG COHESIVE 6X5 TAN STRL LF (GAUZE/BANDAGES/DRESSINGS) IMPLANT
CANISTER WOUND CARE 500ML ATS (WOUND CARE) ×2 IMPLANT
CONT SPEC 4OZ CLIKSEAL STRL BL (MISCELLANEOUS) ×1 IMPLANT
COVER SURGICAL LIGHT HANDLE (MISCELLANEOUS) ×2 IMPLANT
COVER WAND RF STERILE (DRAPES) IMPLANT
CUFF TOURN SGL QUICK 34 (TOURNIQUET CUFF) ×2
CUFF TOURN SGL QUICK 42 (TOURNIQUET CUFF) ×1 IMPLANT
CUFF TRNQT CYL 34X4.125X (TOURNIQUET CUFF) ×1 IMPLANT
DRAPE INCISE IOBAN 66X45 STRL (DRAPES) ×2 IMPLANT
DRAPE U-SHAPE 47X51 STRL (DRAPES) ×2 IMPLANT
DRESSING PREVENA PLUS CUSTOM (GAUZE/BANDAGES/DRESSINGS) ×1 IMPLANT
DRSG PREVENA PLUS CUSTOM (GAUZE/BANDAGES/DRESSINGS) ×2
DURAPREP 26ML APPLICATOR (WOUND CARE) ×2 IMPLANT
ELECT REM PT RETURN 9FT ADLT (ELECTROSURGICAL) ×2
ELECTRODE REM PT RTRN 9FT ADLT (ELECTROSURGICAL) ×1 IMPLANT
GLOVE BIOGEL PI IND STRL 9 (GLOVE) ×1 IMPLANT
GLOVE BIOGEL PI INDICATOR 9 (GLOVE) ×1
GLOVE SURG ORTHO 9.0 STRL STRW (GLOVE) ×2 IMPLANT
GOWN STRL REUS W/ TWL XL LVL3 (GOWN DISPOSABLE) ×2 IMPLANT
GOWN STRL REUS W/TWL XL LVL3 (GOWN DISPOSABLE) ×4
IRRIG SUCT STRYKERFLOW 2 WTIP (MISCELLANEOUS) ×2
IRRIGATION SUCT STRKRFLW 2 WTP (MISCELLANEOUS) IMPLANT
KIT BASIN OR (CUSTOM PROCEDURE TRAY) ×2 IMPLANT
KIT TURNOVER KIT B (KITS) ×2 IMPLANT
MANIFOLD NEPTUNE II (INSTRUMENTS) ×2 IMPLANT
NS IRRIG 1000ML POUR BTL (IV SOLUTION) ×2 IMPLANT
PACK ORTHO EXTREMITY (CUSTOM PROCEDURE TRAY) ×2 IMPLANT
PAD ARMBOARD 7.5X6 YLW CONV (MISCELLANEOUS) ×2 IMPLANT
PREVENA RESTOR ARTHOFORM 33X30 (CANNISTER) ×1 IMPLANT
PREVENA RESTOR ARTHOFORM 46X30 (CANNISTER) ×2 IMPLANT
SPONGE LAP 18X18 RF (DISPOSABLE) IMPLANT
STAPLER VISISTAT 35W (STAPLE) IMPLANT
STOCKINETTE IMPERVIOUS LG (DRAPES) ×2 IMPLANT
SUT ETHILON 2 0 PSLX (SUTURE) IMPLANT
SUT SILK 2 0 (SUTURE) ×2
SUT SILK 2-0 18XBRD TIE 12 (SUTURE) ×1 IMPLANT
SUT VIC AB 1 CTX 27 (SUTURE) ×4 IMPLANT
TOWEL GREEN STERILE (TOWEL DISPOSABLE) ×2 IMPLANT
TUBE CONNECTING 12X1/4 (SUCTIONS) ×2 IMPLANT
YANKAUER SUCT BULB TIP NO VENT (SUCTIONS) ×2 IMPLANT

## 2019-02-04 NOTE — Interval H&P Note (Signed)
History and Physical Interval Note:  02/04/2019 6:58 AM  Sheila Austin  has presented today for surgery, with the diagnosis of Osteomyelitis Right Foot.  The various methods of treatment have been discussed with the patient and family. After consideration of risks, benefits and other options for treatment, the patient has consented to  Procedure(s): RIGHT BELOW KNEE AMPUTATION (Right) as a surgical intervention.  The patient's history has been reviewed, patient examined, no change in status, stable for surgery.  I have reviewed the patient's chart and labs.  Questions were answered to the patient's satisfaction.     Newt Minion

## 2019-02-04 NOTE — Plan of Care (Signed)
  Problem: Coping: Goal: Level of anxiety will decrease Outcome: Progressing   Problem: Pain Managment: Goal: General experience of comfort will improve Outcome: Progressing   Problem: Safety: Goal: Ability to remain free from injury will improve Outcome: Progressing   

## 2019-02-04 NOTE — Evaluation (Signed)
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 PMH includes DM not medically managed, gastroparesis, obesity, R nonhealing foot wound with I&D 01/03/19, HTN, migraines.  Clinical Impression   Pt presents with RLE pain, fair sitting balance, fair mobility and strength (~3/5) of RLE, difficulty performing mobility tasks, and decreased activity tolerance due to pain and fatigue. Pt to benefit from acute PT to address deficits. Pt required min assist for bed mobility on eval, and sat EOB for ~5 minutes without external assist. Further mobility terminated by PT due to R limb guard not being delivered to room yet. Pt very motivated to progress to independence and maximize mobility, PT recommending CIR to address pt goals. PT to progress mobility as tolerated, and will continue to follow acutely.      Follow Up Recommendations CIR;Supervision/Assistance - 24 hour    Equipment Recommendations  Other (comment)(defer to next venue)    Recommendations for Other Services       Precautions / Restrictions Precautions Precautions: Fall Required Braces or Orthoses: Other Brace Other Brace: right Limb guard, not delivered to room when pt evaled so mobility limited for pt safety Restrictions Weight Bearing Restrictions: Yes RLE Weight Bearing: Non weight bearing      Mobility  Bed Mobility Overal bed mobility: Needs Assistance Bed Mobility: Supine to Sit;Sit to Supine     Supine to sit: Min guard Sit to supine: Min assist   General bed mobility comments: min guard for supine to sit for safety, increased time with verbal cuing for sequencing. Min assist for sit to supine for positioning in bed, increased time to return to  supine in appropriate position in bed. Pt sat EOB 5 minutes for circulation and sitting balance post-op, able to perform LAQ.  Transfers Overall transfer level: (NT - right Limb guard not arrived to room yet)                  Ambulation/Gait Ambulation/Gait assistance: (NT)              Stairs            Wheelchair Mobility    Modified Rankin (Stroke Patients Only)       Balance Overall balance assessment: Needs assistance Sitting-balance support: Bilateral upper extremity supported Sitting balance-Leahy Scale: Fair                                       Pertinent Vitals/Pain Pain Assessment: Faces Faces Pain Scale: Hurts little more Pain Location: R residual limb, unable to rate with numerical value Pain Descriptors / Indicators: Discomfort Pain Intervention(s): Limited activity within patient's tolerance;Monitored during session;Repositioned;Patient requesting pain meds-RN notified    Home Living Family/patient expects to be discharged to:: Private residence Living Arrangements: Spouse/significant other Available Help at Discharge: Family Type of Home: Mobile home Home Access: Stairs to enter   Entrance Stairs-Number of Steps: 2; pt states she can get a ramp built if needed Home Layout: One level Home Equipment: Cane - single point;Crutches;Shower seat      Prior Function Level of Independence: Independent with assistive device(s)         Comments: pt reports using cane  and single crutch interchangeably as needed for ambulation PTA, 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 Pt with 60 Y/o daughter, fiance works and family are pt's neighbors     Journalist, newspaper   Dominant Hand: Right    Extremity/Trunk Assessment   Upper Extremity Assessment Upper Extremity Assessment: Overall WFL for tasks assessed    Lower Extremity Assessment Lower Extremity Assessment: Overall WFL for tasks assessed;RLE  deficits/detail RLE Deficits / Details: able to perform [facility] hip extension, quad set, SLR. Difficulty controlling RLE limb movement post-operatively due to change in weight of limb RLE Sensation: WNL    Cervical / Trunk Assessment Cervical / Trunk Assessment: Normal  Communication   Communication: No difficulties  Cognition Arousal/Alertness: Awake/alert Behavior During Therapy: WFL for tasks assessed/performed Overall Cognitive Status: Within Functional Limits for tasks assessed                                 General Comments: pt emotional and tearful about amputation, pt stating she thought she had more time before R limb would be removed.      General Comments      Exercises Amputee Exercises Hip Extension: AROM;Right;5 reps;Supine Hip ABduction/ADduction: AROM;Right;5 reps;Supine Knee Extension: AROM;Right;10 reps;Supine   Assessment/Plan    PT Assessment Patient needs continued PT services  PT Problem List Decreased strength;Decreased mobility;Decreased balance;Decreased activity tolerance;Decreased range of motion;Pain;Obesity;Decreased knowledge of use of DME;Decreased safety awareness       PT Treatment Interventions DME instruction;Therapeutic activities;Gait training;Therapeutic exercise;Patient/family education;Balance training;Functional mobility training    PT Goals (Current goals can be found in the Care Plan section)  Acute Rehab PT Goals Patient Stated Goal: return to independence PT Goal Formulation: With patient Time For Goal Achievement: 02/18/19 Potential to Achieve Goals: Good    Frequency Min 5X/week   Barriers to discharge        Co-evaluation               AM-PAC PT "6 Clicks" Mobility  Outcome Measure Help needed turning from your back to your side while in a flat bed without using bedrails?: A Little Help needed moving from lying on your back to sitting on the side of a flat bed without using bedrails?: A Little Help  needed moving to and from a bed to a chair (including a wheelchair)?: A Lot Help needed standing up from a chair using your arms (e.g., wheelchair or bedside chair)?: A Lot Help needed to walk in hospital room?: Total Help needed climbing 3-5 steps with a railing? : Total 6 Click Score: 12    End of Session   Activity Tolerance: Patient tolerated treatment well;Patient limited by pain Patient left: in bed;with call bell/phone within reach;with bed alarm set;with family/visitor present Nurse Communication: Mobility status PT Visit Diagnosis: Other abnormalities of gait and mobility (R26.89);Muscle weakness (generalized) (M62.81);Pain Pain - Right/Left: Right Pain - part of body: Leg    Time: [id number] PT Time Calculation (min) (ACUTE ONLY): 21 min   Charges:   PT Evaluation $PT Eval Low Complexity: 1 Low          Nyonna Hargrove Conception Chancy, PT Acute Rehabilitation Services Pager [phone]  Office [phone]   Doyt Castellana D Elonda Husky 02/04/2019, 4:25 PM
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
## 2019-02-04 NOTE — Anesthesia Procedure Notes (Signed)
Anesthesia Regional Block: Adductor canal block   Pre-Anesthetic Checklist: ,, timeout performed, Correct Patient, Correct Site, Correct Laterality, Correct Procedure, Correct Position, site marked, Risks and benefits discussed,  Surgical consent,  Pre-op evaluation,  At surgeon's request and post-op pain management  Laterality: Right  Prep: chloraprep       Needles:  Injection technique: Single-shot  Needle Type: Echogenic Needle     Needle Length: 9cm  Needle Gauge: 21     Additional Needles:   Procedures:,,,, ultrasound used (permanent image in chart),,,,  Narrative:  Start time: 02/04/2019 10:25 AM End time: 02/04/2019 10:32 AM Injection made incrementally with aspirations every 5 mL.  Performed by: Personally  Anesthesiologist: Suzette Battiest, MD

## 2019-02-04 NOTE — Anesthesia Procedure Notes (Signed)
Procedure Name: Intubation Date/Time: 02/04/2019 11:52 AM Performed by: Clearnce Sorrel, CRNA Pre-anesthesia Checklist: Patient identified, Emergency Drugs available, Suction available, Patient being monitored and Timeout performed Patient Re-evaluated:Patient Re-evaluated prior to induction Oxygen Delivery Method: Circle system utilized Preoxygenation: Pre-oxygenation with 100% oxygen Induction Type: IV induction Laryngoscope Size: Mac and 3 Grade View: Grade I Tube type: Oral Tube size: 7.0 mm Number of attempts: 1 Airway Equipment and Method: Stylet Placement Confirmation: ETT inserted through vocal cords under direct vision,  positive ETCO2 and breath sounds checked- equal and bilateral Secured at: 23 cm Tube secured with: Tape Dental Injury: Teeth and Oropharynx as per pre-operative assessment

## 2019-02-04 NOTE — Transfer of Care (Signed)
Immediate Anesthesia Transfer of Care Note  Patient: Sheila Austin  Procedure(s) Performed: RIGHT BELOW KNEE AMPUTATION (Right Knee)  Patient Location: PACU  Anesthesia Type:GA combined with regional for post-op pain  Level of Consciousness: awake, alert  and oriented  Airway & Oxygen Therapy: Patient Spontanous Breathing  Post-op Assessment: Report given to RN, Post -op Vital signs reviewed and stable and Patient moving all extremities  Post vital signs: Reviewed and stable  Last Vitals:  Vitals Value Taken Time  BP 132/80 02/04/19 1145  Temp 36.1 C 02/04/19 1144  Pulse 104 02/04/19 1148  Resp 24 02/04/19 1148  SpO2 100 % 02/04/19 1148  Vitals shown include unvalidated device data.  Last Pain:  Vitals:   02/04/19 1144  TempSrc:   PainSc: Asleep         Complications: No apparent anesthesia complications

## 2019-02-04 NOTE — Op Note (Signed)
   Date of Surgery: 02/04/2019  INDICATIONS: Ms. Kneisley is a 33 y.o.-year-old female who has a Levan Hurst grade 3 ulcer lateral aspect of the right foot with exposed calcaneus cuboid and fifth metatarsal.  Patient has purulent drainage she was started on IV antibiotics.  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  The scan also showed abscess extending into the peroneal tendons.  Patient does not have limb salvage options and presents at this time for transtibial amputation.Marland Kitchen  PREOPERATIVE DIAGNOSIS: Abscess osteomyelitis and ulceration right foot  POSTOPERATIVE DIAGNOSIS: Abscess osteomyelitis and ulceration right foot with abscess extending into the lateral and anterior compartments up to the knee.  PROCEDURE: Transtibial amputation right. Excision of the muscle fascia and abscess of the anterior and lateral compartment as well as pulsatile lavage with 3 L of normal saline. Tissue and abscess sent for cultures. Application of Prevena wound VAC with the customizable and Praveena arthro-form wound VAC  SURGEON: Sharol Given, M.D.  ANESTHESIA:  general  IV FLUIDS AND URINE: See anesthesia.  ESTIMATED BLOOD LOSS: Minimal mL.  COMPLICATIONS: None.  DESCRIPTION OF PROCEDURE: The patient was brought to the operating room and underwent a general anesthetic. After adequate levels of anesthesia were obtained patient's lower extremity was prepped using DuraPrep draped into a sterile field. A timeout was called. The foot was draped out of the sterile field with impervious stockinette. A transverse incision was made 11 cm distal to the tibial tubercle. This curved proximally and a large posterior flap was created. The tibia was transected 1 cm proximal to the skin incision. The fibula was transected just proximal to the tibial incision. The tibia was beveled anteriorly. A large posterior flap was created. The sciatic nerve was pulled cut and allowed to retract.  There was a large  abscess that extended from the foot and ankle up the lateral compartment up to the knee.  This required a separate irrigation and debridement with excision using a rondure and a 21 blade knife of the anterior and lateral compartment muscles and fascia back to healthy viable tissue the field was then cleared cleansed and the leg was irrigated with pulsatile lavage 3 L.  The tissue and abscess were sent for tissue cultures.  The field was then draped out with clean towels.  The vascular bundles were suture ligated with 2-0 silk. The deep and superficial fascial layers were closed using #1 Vicryl. The skin was closed using staples and 2-0 nylon. The wound was covered with a Prevena wound VAC. There was a good suction fit. A prosthetic shrinker was applied. Patient was extubated taken to the PACU in stable condition.   DISCHARGE PLANNING:  Antibiotic duration: Patient will need at least 4 weeks of antibiotics pending the results of the tissue cultures.  Continue IV antibiotics until the tissue cultures are finalized.  Weightbearing: Nonweightbearing on the right  Pain medication: Opioid pathway ordered  Dressing care/ Wound VAC: Continue wound VAC for 1 week at discharge  Discharge to: Discharge to home or possible inpatient rehab orders were placed for CIR.  Follow-up: In the office 1 week post operative.  Meridee Score, MD [place] 11:45 AM

## 2019-02-04 NOTE — Anesthesia Preprocedure Evaluation (Signed)
Anesthesia Evaluation  Patient identified by MRN, date of birth, ID band Patient awake    Reviewed: Allergy & Precautions, NPO status , Patient's Chart, lab work & pertinent test results  Airway Mallampati: II  TM Distance: >3 FB     Dental  (+) Dental Advisory Given   Pulmonary sleep apnea ,    breath sounds clear to auscultation       Cardiovascular hypertension, Pt. on medications  Rhythm:Regular Rate:Normal     Neuro/Psych  Headaches,  Neuromuscular disease    GI/Hepatic Neg liver ROS, GERD  ,  Endo/Other  diabetes, Type 2, Insulin DependentMorbid obesity  Renal/GU negative Renal ROS     Musculoskeletal   Abdominal   Peds  Hematology  (+) anemia ,   Anesthesia Other Findings   Reproductive/Obstetrics                             Anesthesia Physical Anesthesia Plan  ASA: III  Anesthesia Plan: General   Post-op Pain Management:  Regional for Post-op pain   Induction: Intravenous  PONV Risk Score and Plan: 3 and Dexamethasone, Ondansetron, Treatment may vary due to age or medical condition and Midazolam  Airway Management Planned: Oral ETT  Additional Equipment:   Intra-op Plan:   Post-operative Plan: Extubation in OR  Informed Consent: I have reviewed the patients History and Physical, chart, labs and discussed the procedure including the risks, benefits and alternatives for the proposed anesthesia with the patient or authorized representative who has indicated his/her understanding and acceptance.     Dental advisory given  Plan Discussed with: CRNA  Anesthesia Plan Comments:         Anesthesia Quick Evaluation

## 2019-02-04 NOTE — Anesthesia Postprocedure Evaluation (Signed)
Anesthesia Post Note  Patient: Sheila Austin  Procedure(s) Performed: RIGHT BELOW KNEE AMPUTATION (Right Knee)     Patient location during evaluation: PACU Anesthesia Type: General Level of consciousness: awake and alert Pain management: pain level controlled Vital Signs Assessment: post-procedure vital signs reviewed and stable Respiratory status: spontaneous breathing, nonlabored ventilation, respiratory function stable and patient connected to nasal cannula oxygen Cardiovascular status: blood pressure returned to baseline and stable Postop Assessment: no apparent nausea or vomiting Anesthetic complications: no    Last Vitals:  Vitals:   02/04/19 1214 02/04/19 1236  BP: 136/89 138/83  Pulse: (!) 104 (!) 102  Resp: (!) 21 18  Temp: 36.5 C 37 C  SpO2: 94% 93%    Last Pain:  Vitals:   02/04/19 1236  TempSrc: Oral  PainSc:                  Tiajuana Amass

## 2019-02-04 NOTE — Progress Notes (Signed)
Orthopedic Tech Progress Note Patient Details:  Sheila Austin May 05, 1986 [id number]  Patient ID: [name], female   DOB: [birth year], 33 y.o.   MRN: [id number]   [place] Pink 02/04/2019, 1:51 PMCalled [employer] for right Limb guard and stump shrinker.

## 2019-02-04 NOTE — Anesthesia Procedure Notes (Signed)
Anesthesia Regional Block: Popliteal block   Pre-Anesthetic Checklist: ,, timeout performed, Correct Patient, Correct Site, Correct Laterality, Correct Procedure, Correct Position, site marked, Risks and benefits discussed,  Surgical consent,  Pre-op evaluation,  At surgeon's request and post-op pain management  Laterality: Right  Prep: chloraprep       Needles:  Injection technique: Single-shot  Needle Type: Echogenic Stimulator Needle     Needle Length: 9cm  Needle Gauge: 21     Additional Needles:   Procedures:, nerve stimulator,,, ultrasound used (permanent image in chart),,,,   Nerve Stimulator or Paresthesia:  Response: peroneal, 0.5 mA,   Additional Responses:   Narrative:  Start time: 02/04/2019 10:20 AM End time: 02/04/2019 10:25 AM Injection made incrementally with aspirations every 5 mL.  Performed by: Personally  Anesthesiologist: Suzette Battiest, MD

## 2019-02-05 ENCOUNTER — Encounter (HOSPITAL_COMMUNITY): Payer: Self-pay | Admitting: Orthopedic Surgery

## 2019-02-05 DIAGNOSIS — I10 Essential (primary) hypertension: Secondary | ICD-10-CM

## 2019-02-05 DIAGNOSIS — Z91030 Bee allergy status: Secondary | ICD-10-CM

## 2019-02-05 DIAGNOSIS — R7881 Bacteremia: Secondary | ICD-10-CM

## 2019-02-05 DIAGNOSIS — E1143 Type 2 diabetes mellitus with diabetic autonomic (poly)neuropathy: Secondary | ICD-10-CM

## 2019-02-05 DIAGNOSIS — A498 Other bacterial infections of unspecified site: Secondary | ICD-10-CM

## 2019-02-05 DIAGNOSIS — E118 Type 2 diabetes mellitus with unspecified complications: Secondary | ICD-10-CM

## 2019-02-05 DIAGNOSIS — K3184 Gastroparesis: Secondary | ICD-10-CM

## 2019-02-05 DIAGNOSIS — M869 Osteomyelitis, unspecified: Secondary | ICD-10-CM

## 2019-02-05 DIAGNOSIS — F4320 Adjustment disorder, unspecified: Secondary | ICD-10-CM

## 2019-02-05 DIAGNOSIS — K0889 Other specified disorders of teeth and supporting structures: Secondary | ICD-10-CM

## 2019-02-05 DIAGNOSIS — A4152 Sepsis due to Pseudomonas: Secondary | ICD-10-CM | POA: Diagnosis not present

## 2019-02-05 DIAGNOSIS — Z89511 Acquired absence of right leg below knee: Secondary | ICD-10-CM

## 2019-02-05 DIAGNOSIS — B961 Klebsiella pneumoniae [K. pneumoniae] as the cause of diseases classified elsewhere: Secondary | ICD-10-CM

## 2019-02-05 DIAGNOSIS — Z6841 Body Mass Index (BMI) 40.0 and over, adult: Secondary | ICD-10-CM | POA: Diagnosis not present

## 2019-02-05 DIAGNOSIS — N184 Chronic kidney disease, stage 4 (severe): Secondary | ICD-10-CM | POA: Diagnosis not present

## 2019-02-05 DIAGNOSIS — L03115 Cellulitis of right lower limb: Secondary | ICD-10-CM | POA: Diagnosis not present

## 2019-02-05 DIAGNOSIS — K760 Fatty (change of) liver, not elsewhere classified: Secondary | ICD-10-CM

## 2019-02-05 DIAGNOSIS — A419 Sepsis, unspecified organism: Secondary | ICD-10-CM

## 2019-02-05 DIAGNOSIS — L089 Local infection of the skin and subcutaneous tissue, unspecified: Secondary | ICD-10-CM

## 2019-02-05 DIAGNOSIS — M868X7 Other osteomyelitis, ankle and foot: Secondary | ICD-10-CM | POA: Diagnosis not present

## 2019-02-05 DIAGNOSIS — E11628 Type 2 diabetes mellitus with other skin complications: Secondary | ICD-10-CM

## 2019-02-05 DIAGNOSIS — L02415 Cutaneous abscess of right lower limb: Secondary | ICD-10-CM | POA: Diagnosis not present

## 2019-02-05 DIAGNOSIS — K219 Gastro-esophageal reflux disease without esophagitis: Secondary | ICD-10-CM

## 2019-02-05 DIAGNOSIS — Z20828 Contact with and (suspected) exposure to other viral communicable diseases: Secondary | ICD-10-CM | POA: Diagnosis not present

## 2019-02-05 DIAGNOSIS — B954 Other streptococcus as the cause of diseases classified elsewhere: Secondary | ICD-10-CM

## 2019-02-05 DIAGNOSIS — Z880 Allergy status to penicillin: Secondary | ICD-10-CM

## 2019-02-05 DIAGNOSIS — M86171 Other acute osteomyelitis, right ankle and foot: Secondary | ICD-10-CM

## 2019-02-05 DIAGNOSIS — E43 Unspecified severe protein-calorie malnutrition: Secondary | ICD-10-CM | POA: Diagnosis not present

## 2019-02-05 DIAGNOSIS — E871 Hypo-osmolality and hyponatremia: Secondary | ICD-10-CM | POA: Diagnosis not present

## 2019-02-05 DIAGNOSIS — B955 Unspecified streptococcus as the cause of diseases classified elsewhere: Secondary | ICD-10-CM

## 2019-02-05 LAB — AEROBIC CULTURE W GRAM STAIN (SUPERFICIAL SPECIMEN)

## 2019-02-05 LAB — BASIC METABOLIC PANEL
Anion gap: 10 (ref 5–15)
BUN: 21 mg/dL — ABNORMAL HIGH (ref 6–20)
CO2: 22 mmol/L (ref 22–32)
Calcium: 6.9 mg/dL — ABNORMAL LOW (ref 8.9–10.3)
Chloride: 99 mmol/L (ref 98–111)
Creatinine, Ser: 1.02 mg/dL — ABNORMAL HIGH (ref 0.44–1.00)
GFR calc Af Amer: >60 mL/min (ref >60)
GFR calc non Af Amer: >60 mL/min (ref >60)
Glucose, Bld: 148 mg/dL — ABNORMAL HIGH (ref 70–99)
Potassium: 3.5 mmol/L (ref 3.5–5.1)
Sodium: 131 mmol/L — ABNORMAL LOW (ref 135–145)

## 2019-02-05 LAB — RETICULOCYTES
Immature Retic Fract: 24.3 % — ABNORMAL HIGH (ref 2.3–15.9)
RBC.: 2.71 MIL/uL — ABNORMAL LOW (ref 3.87–5.11)
Retic Count, Absolute: 37.1 10*3/uL (ref 19.0–186.0)
Retic Ct Pct: 1.4 % (ref 0.4–3.1)

## 2019-02-05 LAB — CULTURE, BLOOD (ROUTINE X 2)

## 2019-02-05 LAB — FERRITIN: Ferritin: 495 ng/mL — ABNORMAL HIGH (ref 11–307)

## 2019-02-05 LAB — CBC
HCT: 23.5 % — ABNORMAL LOW (ref 36.0–46.0)
Hemoglobin: 7.2 g/dL — ABNORMAL LOW (ref 12.0–15.0)
MCH: 26.6 pg (ref 26.0–34.0)
MCHC: 30.6 g/dL (ref 30.0–36.0)
MCV: 86.7 fL (ref 80.0–100.0)
Platelets: 441 10*3/uL — ABNORMAL HIGH (ref 150–400)
RBC: 2.71 MIL/uL — ABNORMAL LOW (ref 3.87–5.11)
RDW: 14.1 % (ref 11.5–15.5)
WBC: 21.7 10*3/uL — ABNORMAL HIGH (ref 4.0–10.5)
nRBC: 0 % (ref 0.0–0.2)

## 2019-02-05 LAB — GLUCOSE, CAPILLARY
Glucose-Capillary: 129 mg/dL — ABNORMAL HIGH (ref 70–99)
Glucose-Capillary: 133 mg/dL — ABNORMAL HIGH (ref 70–99)
Glucose-Capillary: 112 mg/dL — ABNORMAL HIGH (ref 70–99)
Glucose-Capillary: 139 mg/dL — ABNORMAL HIGH (ref 70–99)

## 2019-02-05 LAB — IRON AND TIBC
Iron: 8 ug/dL — ABNORMAL LOW (ref 28–170)
Saturation Ratios: 7 % — ABNORMAL LOW (ref 10.4–31.8)
TIBC: 109 ug/dL — ABNORMAL LOW (ref 250–450)
UIBC: 101 ug/dL

## 2019-02-05 LAB — SURGICAL PATHOLOGY

## 2019-02-05 LAB — VITAMIN B12: Vitamin B-12: 537 pg/mL (ref 180–914)

## 2019-02-05 LAB — FOLATE: Folate: 4 ng/mL — ABNORMAL LOW (ref >5.9)

## 2019-02-05 MED ORDER — SODIUM CHLORIDE 0.9 % IV SOLN
INTRAVENOUS | Status: DC
  Administered 2019-02-05 – 2019-02-06 (×2): via INTRAVENOUS

## 2019-02-05 MED ORDER — AMOXICILLIN 500 MG PO CAPS
500.0000 mg | ORAL_CAPSULE | Freq: Once | ORAL | Status: AC
  Administered 2019-02-05: 500 mg via ORAL
  Filled 2019-02-05: qty 1

## 2019-02-05 MED ORDER — DIPHENHYDRAMINE HCL 50 MG/ML IJ SOLN
25.0000 mg | Freq: Once | INTRAMUSCULAR | Status: AC | PRN
  Administered 2019-02-05: 25 mg via INTRAVENOUS
  Filled 2019-02-05: qty 1

## 2019-02-05 MED ORDER — EPINEPHRINE 0.3 MG/0.3ML IJ SOAJ
0.3000 mg | Freq: Once | INTRAMUSCULAR | Status: DC | PRN
  Filled 2019-02-05 (×2): qty 0.6

## 2019-02-05 MED ORDER — SODIUM CHLORIDE 0.9 % IV SOLN
2.0000 g | INTRAVENOUS | Status: DC
  Administered 2019-02-05 – 2019-02-09 (×5): 2 g via INTRAVENOUS
  Filled 2019-02-05 (×5): qty 20

## 2019-02-05 NOTE — Plan of Care (Signed)

## 2019-02-05 NOTE — Progress Notes (Signed)
Physical Therapy Treatment Patient Details Name: Sheila Austin MRN: [id number] DOB: May 24, 1985 Today's Date: 02/05/2019    History of Present Illness Pt is a 33 y/o female admitted to ED with emesis, fever, R foot sore with cellulitis and osteomyelitis of 5th metatarsal, cuboid, calcaneous. 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 PMH includes DM not medically managed, gastroparesis, obesity, R nonhealing foot wound with I&D 01/03/19, HTN, migraines.    PT Comments    Pt making steady progress with functional mobility and tolerated transfers this session with min A for safety/stability. Pt will likely progress well and will attempt gait training at next session. Pt would continue to benefit from skilled physical therapy services at this time while admitted and after d/c to address the below listed limitations in order to improve overall safety and independence with functional mobility.  Of note, pt's HR increasing from high 90's to 125 bpm with transfers.    Follow Up Recommendations  CIR;Supervision/Assistance - 24 hour     Equipment Recommendations  Rolling walker with 5" wheels;3in1 (PT)    Recommendations for Other Services       Precautions / Restrictions Precautions Precautions: Fall Precaution Comments: wound VAC Required Braces or Orthoses: Other Brace Other Brace: R residual limb protector Restrictions Weight Bearing Restrictions: Yes RLE Weight Bearing: Non weight bearing    Mobility  Bed Mobility Overal bed mobility: Needs Assistance Bed Mobility: Supine to Sit     Supine to sit: Supervision     General bed mobility comments: HOB elevated, use of bed rails, supervision for safety  Transfers Overall transfer level: Needs assistance Equipment used: Rolling walker (2 wheeled) Transfers: Sit to/from [facility] Sit to Stand: Min assist Stand pivot transfers: Min assist       General transfer comment: cueing for safety adn technique, min A  for stability with transitional movement  Ambulation/Gait             General Gait Details: pt able to hop on L LE during transfers but very anxious   Stairs             Wheelchair Mobility    Modified Rankin (Stroke Patients Only)       Balance Overall balance assessment: Needs assistance Sitting-balance support: Bilateral upper extremity supported Sitting balance-Leahy Scale: Good     Standing balance support: Bilateral upper extremity supported Standing balance-Leahy Scale: Poor                              Cognition Arousal/Alertness: Awake/alert Behavior During Therapy: Anxious Overall Cognitive Status: Within Functional Limits for tasks assessed                                        Exercises Amputee Exercises Knee Flexion: AROM;Right;5 reps;Seated Knee Extension: AROM;Right;10 reps;Seated Other Exercises Other Exercises: PT demonstrated and instructed pt in light tactile sensory input to R residual limb with pt demonstration back    General Comments        Pertinent Vitals/Pain Pain Assessment: No/denies pain Pain Intervention(s): Monitored during session    Home Living                      Prior Function            PT Goals (current goals can now be found in the  care plan section) Acute Rehab PT Goals PT Goal Formulation: With patient Time For Goal Achievement: 02/18/19 Potential to Achieve Goals: Good Progress towards PT goals: Progressing toward goals    Frequency    Min 5X/week      PT Plan Current plan remains appropriate    Co-evaluation              AM-PAC PT "6 Clicks" Mobility   Outcome Measure  Help needed turning from your back to your side while in a flat bed without using bedrails?: A Little Help needed moving from lying on your back to sitting on the side of a flat bed without using bedrails?: A Little Help needed moving to and from a bed to a chair (including a  wheelchair)?: A Lot Help needed standing up from a chair using your arms (e.g., wheelchair or bedside chair)?: A Lot Help needed to walk in hospital room?: Total Help needed climbing 3-5 steps with a railing? : Total 6 Click Score: 12    End of Session Equipment Utilized During Treatment: Gait belt Activity Tolerance: Patient tolerated treatment well;Patient limited by pain Patient left: in chair;with call bell/phone within reach Nurse Communication: Mobility status PT Visit Diagnosis: Other abnormalities of gait and mobility (R26.89);Muscle weakness (generalized) (M62.81);Pain Pain - Right/Left: Right Pain - part of body: Leg     Time: [id number] PT Time Calculation (min) (ACUTE ONLY): 24 min  Charges:  $Therapeutic Exercise: 8-22 mins $Therapeutic Activity: 8-22 mins                     Sherie Don, PT, DPT  Acute Rehabilitation Services Pager [phone] Office [place] 02/05/2019, 11:39 AM

## 2019-02-05 NOTE — Progress Notes (Signed)
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 However, upon further review of medications she has received in the past, it was noted she received piperacillin/tazobactam in May of 2013 and tolerated it well per the notes from that admission. 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 She does not recall the reaction herself and she has not tried any other antibiotics such as cephalexin or amoxicillin.   Given her allergic reaction happening about 30 years ago, it is likely she has outgrown this IgE mediated allergy. It is also reassuring she received a dose of piperacillin/tazobactam without any trouble back in 2013. We will give her a dose of amoxicillin 500 mg today and see how she tolerates.  Post Amoxicillin Challenge:  It was noted in the chart by the nurse that the patient did experience some itching on her arms and shoulders after receiving the dose of amoxicillin.   I went and spoke to the patient who reported about 30 minutes after taking the amoxicillin her arms started to itch. She did not report any redness, rash or hives where she was itching. She also reports there was no itching or redness on her back, chest or legs. She was able to breath without issues, and there was no swelling of the face or neck. The nurse administered 25 mg IV benadryl which stopped the itch.   I wonder if this was psychological vs a true itch/reaction caused by the medication, but it is reassuring that she did not have any rash, swelling or trouble breathing. She does not have an anaphylactic reaction to penicillin and we will at least be able to re-label the severity of her reaction.   Of note she is tolerating ceftriaxone well so she can now take cephalosporins in the future. We may re-challenge her  next week to see if she has this itching again.     Nicoletta Dress, PharmD PGY2 Infectious Disease Pharmacy Resident

## 2019-02-05 NOTE — Plan of Care (Signed)
  Problem: Education: Goal: Knowledge of General Education information will improve Description: Including pain rating scale, medication(s)/side effects and non-pharmacologic comfort measures Outcome: Progressing   Problem: Pain Managment: Goal: General experience of comfort will improve Outcome: Progressing   Problem: Safety: Goal: Ability to remain free from injury will improve Outcome: Progressing   

## 2019-02-05 NOTE — Progress Notes (Signed)
PROGRESS NOTE    Sheila Austin  [id number] DOB: 12-Jan-1986 DOA: 01/31/2019 PCP: Maximiano Coss, NP   Brief Narrative:  Patient is a 33 y.o.femalewith medical history significant ofmorbid obesity (BMI 45); uncontrolled DM; h/o E coli sepsis; and HTN, non compliance who presented to the hospital with diabetic foot infection. Her initial visit about this issue was 8/24, and she was admitted to the hospital 8/26-29. She had debridement on 8/29 and then left the hospital AMA. She was last seen for this issue by Dr. Scot Dock on 9/23. She had good arterial dopplers and was given Keflex. She came in this admission because of "very harsh nausea and vomiting" x 4 days. +fever yesterday intermittently, up to 99.9. She has a wound on her foot - it started in July as a blister. She got in a river and it busted open. She saw Dr. Scot Dock and she had debridement and she left AMA due to child care issues. Patient was admitted and seen by orthopedic surgery subsequently underwent amputation  Subjective: Today, patient complains pain at the amputated site.  Denies any nausea, vomiting or abdominal pain.  Denies any fever, chills or rigors.  Assessment & Plan:   Sepsis  due to cellulitis with right foot diabetic ulcer/osteomyelitis involving fifth metatarsal the cuboid and calcaneus bone/septic arthritis: Patient with significant leukocytosis, leukocytosis however trending down.  Status post below-knee amputation by orthopedic surgery, [place].    Afebrile.   on aztreonam Flagyl vancomycin-pharmacy to dose.  Will consult infectious disease.  Spoke with ID about it.  Blood culture no growth so far.    Recent Labs  Lab 02/01/19 0033 02/02/19 0301 02/03/19 0546 02/04/19 0229 02/05/19 0212  WBC 35.6* 29.2* 26.5* 26.8* 21.7*   Nausea vomiting likely due to gastroparesis.   Improved.  Continue antiemetics IV fluids  Blood culture with Streptococcus intermedius 9/27, follow-up culture/sennsitivity-  continue vancomycin.  Tissue culture from amputation no growth so far.  Hyponatremia .  Mild.  Sodium of 131 today.  We will continue to monitor.  Anemia likely from chronic disease.  Serum iron of 8 with TIBC as 109.  Ferritin of 495. transfuse if less than 7 g  CKD stage IIIl: Baseline creatinine 1.5-1.9.  Latest creatinine of 1.02  Recent Labs  Lab 02/01/19 1020 02/02/19 0301 02/03/19 0546 02/04/19 0229 02/05/19 0212  BUN 29* 27* 21* 23* 21*  CREATININE 1.61* 1.40* 1.13* 1.09* 1.02*   Essential hypertension: Not on any medication, stable.  Will closely monitor blood pressure.  Diabetic foot ulcer with osteomyelitis with uncontrolled diabetes.  Latest hemoglobin A1c 10.8. Cont Lantus, sliding scale, diabetic diet and closely monitor.  Recent Labs  Lab 02/04/19 1041 02/04/19 1145 02/04/19 1849 02/04/19 2059 02/05/19 0655  GLUCAP 93 106* 148* 133* 129*    BMI 45.0-49.9, adult: Would benefit from weight reduction.  Adjustment disorder-stable  Intra-abdominal lymphadenopathy seen incidentally on the CT scan with splenomegaly with multiple scattered enlarged retroperitoneal and right iliac and right inguinal lymph nodes.  Likely reactive in the setting of infection - will need follow-up as outpatient., possible  biopsy to rule out myeloproliferative disorder if not resolved with antibiotics and infection treatment  Nutrition: Nutrition Problem: Increased nutrient needs Etiology: wound healing Signs/Symptoms: estimated needs Interventions: Prostat, Glucerna shake  DVT prophylaxis: Heparin subcu  Code Status: Full code  Family Communication:  None today  Disposition Plan: CIR. Remains inpatient, pending clinical improvement.  Follow orthopedic and infectious disease recommendation.  Consult PT, OT and inpatient rehab.  Consultants:  Orthopedics  Procedures: Right transtibial amputation on 02/04/2019 by orthopedics Dr. Sharol Given  Microbiology: Streptococcus species in  1 blood culture  Antimicrobials:  Anti-infectives (From admission, onward)   Start     Dose/Rate Route Frequency Ordered Stop   02/03/19 2200  vancomycin (VANCOCIN) 1,500 mg in sodium chloride 0.9 % 500 mL IVPB     1,500 mg 250 mL/hr over 120 Minutes Intravenous Every 24 hours 02/03/19 1023     02/01/19 2200  vancomycin (VANCOCIN) 1,250 mg in sodium chloride 0.9 % 250 mL IVPB  Status:  Discontinued     1,250 mg 166.7 mL/hr over 90 Minutes Intravenous Every 24 hours 02/01/19 0257 02/03/19 1023   02/01/19 1400  aztreonam (AZACTAM) 1 g in sodium chloride 0.9 % 100 mL IVPB     1 g 200 mL/hr over 30 Minutes Intravenous Every 8 hours 02/01/19 0257     02/01/19 1130  metroNIDAZOLE (FLAGYL) IVPB 500 mg     500 mg 100 mL/hr over 60 Minutes Intravenous Every 8 hours 02/01/19 1128     02/01/19 0200  vancomycin (VANCOCIN) 2,000 mg in sodium chloride 0.9 % 500 mL IVPB     2,000 mg 250 mL/hr over 120 Minutes Intravenous  Once 02/01/19 0157 02/01/19 0606   02/01/19 0130  aztreonam (AZACTAM) 2 g in sodium chloride 0.9 % 100 mL IVPB     2 g 200 mL/hr over 30 Minutes Intravenous  Once 02/01/19 0128 02/01/19 0346   02/01/19 0130  metroNIDAZOLE (FLAGYL) IVPB 500 mg     500 mg 100 mL/hr over 60 Minutes Intravenous  Once 02/01/19 0128 02/01/19 0313   02/01/19 0130  vancomycin (VANCOCIN) IVPB 1000 mg/200 mL premix  Status:  Discontinued     1,000 mg 200 mL/hr over 60 Minutes Intravenous  Once 02/01/19 0128 02/01/19 0157      Objective: Vitals:   02/04/19 1236 02/04/19 1538 02/05/19 0328 02/05/19 0811  BP: 138/83 132/85 (!) 148/71 (!) 143/96  Pulse: (!) 102 (!) 105 96 (!) 111  Resp: 18 16 16 17   Temp: 98.6 F (37 C) 98.3 F (36.8 C) 99.8 F (37.7 C) 98.8 F (37.1 C)  TempSrc: Oral Oral Oral Oral  SpO2: 93% 97% 100% 99%  Weight:      Height:        Intake/Output Summary (Last 24 hours) at 02/05/2019 1117 Last data filed at 02/05/2019 0900 Gross per 24 hour  Intake 2712.16 ml  Output 1 ml    Net 2711.16 ml   Filed Weights   02/01/19 0034 02/04/19 1010  Weight: 112.5 kg 112.5 kg   Weight change:   Body mass index is 45.36 kg/m.  Intake/Output from previous day: 09/30 0701 - 10/01 0700 In: 2592.2 [P.O.:480; I.V.:512.1; IV Piggyback:1600.1] Out: 1 [Urine:1] Intake/Output this shift: Total I/O In: 120 [P.O.:120] Out: -   Examination: General: Obese, not in obvious distress HENT: Normocephalic, pupils equally reacting to light and accommodation.  No scleral pallor or icterus noted. Oral mucosa is moist.  Chest:  Clear breath sounds.  Diminished breath sounds bilaterally. No crackles or wheezes.  CVS: S1 &S2 heard. No murmur.  Regular rate and rhythm. Abdomen: Soft, nontender, nondistended.  Bowel sounds are heard.  Liver is not palpable, no abdominal mass palpated Extremities: Status post right foot below-knee amputation with dressing.  Wound VAC in place. Psych: Alert, awake and oriented, normal mood CNS:  No cranial nerve deficits.  Power equal in all extremities.  No sensory deficits  noted.  No cerebellar signs.   Skin: Status post right below-knee amputation   Medications:  Scheduled Meds:  sodium chloride   Intravenous Once   docusate sodium  100 mg Oral BID   feeding supplement (GLUCERNA SHAKE)  237 mL Oral Q1500   feeding supplement (PRO-STAT SUGAR FREE 64)  30 mL Oral BID   heparin  5,000 Units Subcutaneous Q8H   insulin aspart  0-20 Units Subcutaneous TID WC   insulin aspart  0-5 Units Subcutaneous QHS   insulin glargine  25 Units Subcutaneous Daily   metoCLOPramide (REGLAN) injection  5 mg Intravenous Q6H   pantoprazole  40 mg Oral Daily   sodium chloride flush  3 mL Intravenous Q12H   Continuous Infusions:  sodium chloride     aztreonam 1 g (02/05/19 0543)   methocarbamol (ROBAXIN) IV     metronidazole 500 mg (02/05/19 0313)   vancomycin 1,500 mg (02/04/19 2207)    Data Reviewed: I have personally reviewed following labs and  imaging studies  CBC: Recent Labs  Lab 02/01/19 0033  02/02/19 0301 02/03/19 0546 02/03/19 2027 02/04/19 0229 02/05/19 0212  WBC 35.6*  --  29.2* 26.5*  --  26.8* 21.7*  NEUTROABS 32.3*  --   --   --   --   --   --   HGB 9.5*   < > 8.1* 7.4* 8.4* 7.9* 7.2*  HCT 28.7*   < > 24.9* 23.2* 25.5* 25.3* 23.5*  MCV 84.2  --  85.6 86.2  --  86.1 86.7  PLT 461*  --  385 380  --  411* 441*   < > = values in this interval not displayed.   Basic Metabolic Panel: Recent Labs  Lab 02/01/19 1020 02/02/19 0301 02/03/19 0546 02/04/19 0229 02/05/19 0212  NA 135 133* 133* 130* 131*  K 3.1* 3.3* 3.3* 3.7 3.5  CL 93* 93* 98 99 99  CO2 27 25 25 23 22   GLUCOSE 166* 290* 182* 114* 148*  BUN 29* 27* 21* 23* 21*  CREATININE 1.61* 1.40* 1.13* 1.09* 1.02*  CALCIUM 8.6* 7.7* 7.2* 6.9* 6.9*   GFR: Estimated Creatinine Clearance: 94.7 mL/min (A) (by C-G formula based on SCr of 1.02 mg/dL (H)). Liver Function Tests: Recent Labs  Lab 02/01/19 0033  AST 15  ALT 12  ALKPHOS 215*  BILITOT 1.2  PROT 7.9  ALBUMIN 2.0*   No results for input(s): LIPASE, AMYLASE in the last 168 hours. No results for input(s): AMMONIA in the last 168 hours. Coagulation Profile: Recent Labs  Lab 02/01/19 0150  INR 1.2   Cardiac Enzymes: No results for input(s): CKTOTAL, CKMB, CKMBINDEX, TROPONINI in the last 168 hours. BNP (last 3 results) No results for input(s): PROBNP in the last 8760 hours. HbA1C: No results for input(s): HGBA1C in the last 72 hours. CBG: Recent Labs  Lab 02/04/19 1041 02/04/19 1145 02/04/19 1849 02/04/19 2059 02/05/19 0655  GLUCAP 93 106* 148* 133* 129*   Lipid Profile: No results for input(s): CHOL, HDL, LDLCALC, TRIG, CHOLHDL, LDLDIRECT in the last 72 hours. Thyroid Function Tests: No results for input(s): TSH, T4TOTAL, FREET4, T3FREE, THYROIDAB in the last 72 hours. Anemia Panel: Recent Labs    02/02/19 1324 02/05/19 0212  VITAMINB12  --  537  FOLATE  --  4.0*    FERRITIN 792* 495*  TIBC 164* 109*  IRON 14* 8*  RETICCTPCT  --  1.4   Sepsis Labs: Recent Labs  Lab 02/01/19 0655 02/01/19 1020 02/01/19  1605 02/01/19 1900  PROCALCITON  --   --  2.85  --   LATICACIDVEN 3.0* 2.5* 1.6 2.2*    Recent Results (from the past 240 hour(s))  Blood culture (routine x 2)     Status: None (Preliminary result)   Collection Time: 02/01/19 12:19 AM   Specimen: BLOOD  Result Value Ref Range Status   Specimen Description BLOOD LEFT HAND  Final   Special Requests   Final    BOTTLES DRAWN AEROBIC ONLY Blood Culture adequate volume   Culture   Final    NO GROWTH 4 DAYS Performed at [facility], [place] [address], [place], [place] [address]    Report Status PENDING  Incomplete  Urine culture     Status: Abnormal   Collection Time: 02/01/19 12:25 AM   Specimen: Urine, Random  Result Value Ref Range Status   Specimen Description URINE, RANDOM  Final   Special Requests   Final    NONE Performed at [facility], [place] [address], [place], [place] [address]    Culture MULTIPLE SPECIES PRESENT, SUGGEST RECOLLECTION (A)  Final   Report Status 02/02/2019 FINAL  Final  Blood culture (routine x 2)     Status: Abnormal   Collection Time: 02/01/19 12:34 AM   Specimen: BLOOD  Result Value Ref Range Status   Specimen Description BLOOD RIGHT ARM  Final   Special Requests   Final    BOTTLES DRAWN AEROBIC AND ANAEROBIC Blood Culture results may not be optimal due to an excessive volume of blood received in culture bottles   Culture  Setup Time   Final    GRAM POSITIVE COCCI ANAEROBIC BOTTLE ONLY CRITICAL RESULT CALLED TO, READ BACK BY AND VERIFIED WITHBronwen Betters [facility] 2154 02/03/19 A BROWNING Performed at [facility], [place] [address], [place],  [address]    Culture STREPTOCOCCUS INTERMEDIUS (A)  Final   Report Status 02/05/2019 FINAL  Final   Organism ID, Bacteria STREPTOCOCCUS INTERMEDIUS  Final      Susceptibility   Streptococcus  intermedius - MIC*    PENICILLIN <=0.06 SENSITIVE Sensitive     CEFTRIAXONE 0.25 SENSITIVE Sensitive     ERYTHROMYCIN >=8 RESISTANT Resistant     LEVOFLOXACIN 0.5 SENSITIVE Sensitive     VANCOMYCIN 0.5 SENSITIVE Sensitive     * STREPTOCOCCUS INTERMEDIUS  Blood Culture ID Panel (Reflexed)     Status: Abnormal   Collection Time: 02/01/19 12:34 AM  Result Value Ref Range Status   Enterococcus species NOT DETECTED NOT DETECTED Final   Listeria monocytogenes NOT DETECTED NOT DETECTED Final   Staphylococcus species NOT DETECTED NOT DETECTED Final   Staphylococcus aureus (BCID) NOT DETECTED NOT DETECTED Final   Streptococcus species DETECTED (A) NOT DETECTED Final    Comment: Not Enterococcus species, Streptococcus agalactiae, Streptococcus pyogenes, or Streptococcus pneumoniae. CRITICAL RESULT CALLED TO, READ BACK BY AND VERIFIED WITH: L CURRAN PHARMD 2154 02/03/19 A BROWNING    Streptococcus agalactiae NOT DETECTED NOT DETECTED Final   Streptococcus pneumoniae NOT DETECTED NOT DETECTED Final   Streptococcus pyogenes NOT DETECTED NOT DETECTED Final   Acinetobacter baumannii NOT DETECTED NOT DETECTED Final   Enterobacteriaceae species NOT DETECTED NOT DETECTED Final   Enterobacter cloacae complex NOT DETECTED NOT DETECTED Final   Escherichia coli NOT DETECTED NOT DETECTED Final   Klebsiella oxytoca NOT DETECTED NOT DETECTED Final   Klebsiella pneumoniae NOT DETECTED NOT DETECTED Final   Proteus species NOT DETECTED NOT DETECTED Final  Serratia marcescens NOT DETECTED NOT DETECTED Final   Haemophilus influenzae NOT DETECTED NOT DETECTED Final   Neisseria meningitidis NOT DETECTED NOT DETECTED Final   Pseudomonas aeruginosa NOT DETECTED NOT DETECTED Final   Candida albicans NOT DETECTED NOT DETECTED Final   Candida glabrata NOT DETECTED NOT DETECTED Final   Candida krusei NOT DETECTED NOT DETECTED Final   Candida parapsilosis NOT DETECTED NOT DETECTED Final   Candida tropicalis NOT  DETECTED NOT DETECTED Final    Comment: Performed at [facility], [place] [address], [place], [place] [address]  SARS Coronavirus 2 [facility] order, Performed in [facility] hospital lab) Nasopharyngeal Nasopharyngeal Swab     Status: None   Collection Time: 02/01/19  3:50 AM   Specimen: Nasopharyngeal Swab  Result Value Ref Range Status   SARS Coronavirus 2 NEGATIVE NEGATIVE Final    Comment: (NOTE) If result is NEGATIVE SARS-CoV-2 target nucleic acids are NOT DETECTED. The SARS-CoV-2 RNA is generally detectable in upper and lower  respiratory specimens during the acute phase of infection. The lowest  concentration of SARS-CoV-2 viral copies this assay can detect is 250  copies / mL. A negative result does not preclude SARS-CoV-2 infection  and should not be used as the sole basis for treatment or other  patient management decisions.  A negative result may occur with  improper specimen collection / handling, submission of specimen other  than nasopharyngeal swab, presence of viral mutation(s) within the  areas targeted by this assay, and inadequate number of viral copies  (<250 copies / mL). A negative result must be combined with clinical  observations, patient history, and epidemiological information. If result is POSITIVE SARS-CoV-2 target nucleic acids are DETECTED. The SARS-CoV-2 RNA is generally detectable in upper and lower  respiratory specimens dur ing the acute phase of infection.  Positive  results are indicative of active infection with SARS-CoV-2.  Clinical  correlation with patient history and other diagnostic information is  necessary to determine patient infection status.  Positive results do  not rule out bacterial infection or co-infection with other viruses. If result is PRESUMPTIVE POSTIVE SARS-CoV-2 nucleic acids MAY BE PRESENT.   A presumptive positive result was obtained on the submitted specimen  and confirmed on repeat testing.  While 2019 novel  coronavirus  (SARS-CoV-2) nucleic acids may be present in the submitted sample  additional confirmatory testing may be necessary for epidemiological  and / or clinical management purposes  to differentiate between  SARS-CoV-2 and other Sarbecovirus currently known to infect humans.  If clinically indicated additional testing with an alternate test  methodology [phone]) is advised. The SARS-CoV-2 RNA is generally  detectable in upper and lower respiratory sp ecimens during the acute  phase of infection. The expected result is Negative. Fact Sheet for Patients:  StrictlyIdeas.no Fact Sheet for Healthcare Providers: BankingDealers.co.za This test is not yet approved or cleared by the [place] FDA and has been authorized for detection and/or diagnosis of SARS-CoV-2 by FDA under an Emergency Use Authorization (EUA).  This EUA will remain in effect (meaning this test can be used) for the duration of the COVID-19 declaration under Section 564(b)(1) of the Act, 21 U.S.C. section 360bbb-3(b)(1), unless the authorization is terminated or revoked sooner. Performed at [facility], [place] [address], [place], [place] [address]   Aerobic Culture (superficial specimen)     Status: None (Preliminary result)   Collection Time: 02/01/19 11:55 AM   Specimen: Wound  Result Value Ref Range Status  Specimen Description WOUND RIGHT FOOT  Final   Special Requests NONE  Final   Gram Stain   Final    RARE WBC PRESENT, PREDOMINANTLY PMN ABUNDANT GRAM POSITIVE COCCI ABUNDANT GRAM NEGATIVE RODS    Culture   Final    RARE KLEBSIELLA PNEUMONIAE CULTURE REINCUBATED FOR BETTER GROWTH Performed at [facility], [place] [address] [place], [place] [address]    Report Status PENDING  Incomplete   Organism ID, Bacteria KLEBSIELLA PNEUMONIAE  Final      Susceptibility   Klebsiella pneumoniae - MIC*    AMPICILLIN >=32 RESISTANT Resistant      CEFAZOLIN >=64 RESISTANT Resistant     CEFEPIME <=1 SENSITIVE Sensitive     CEFTAZIDIME <=1 SENSITIVE Sensitive     CEFTRIAXONE <=1 SENSITIVE Sensitive     CIPROFLOXACIN <=0.25 SENSITIVE Sensitive     GENTAMICIN 4 SENSITIVE Sensitive     IMIPENEM 2 SENSITIVE Sensitive     TRIMETH/SULFA <=20 SENSITIVE Sensitive     AMPICILLIN/SULBACTAM 16 INTERMEDIATE Intermediate     PIP/TAZO <=4 SENSITIVE Sensitive     Extended ESBL NEGATIVE Sensitive     * RARE KLEBSIELLA PNEUMONIAE  Surgical pcr screen     Status: None   Collection Time: 02/04/19  2:11 AM   Specimen: Nasal Mucosa; Nasal Swab  Result Value Ref Range Status   MRSA, PCR NEGATIVE NEGATIVE Final   Staphylococcus aureus NEGATIVE NEGATIVE Final    Comment: (NOTE) The Xpert SA Assay (FDA approved for NASAL specimens in patients 32 years of age and older), is one component of a comprehensive surveillance program. It is not intended to diagnose infection nor to guide or monitor treatment. Performed at [facility], [place] [address] [place], [place] [address]   Aerobic/Anaerobic Culture (surgical/deep wound)     Status: None (Preliminary result)   Collection Time: 02/04/19 11:16 AM   Specimen: Soft Tissue, Other  Result Value Ref Range Status   Specimen Description TISSUE  Final   Special Requests NONE  Final   Gram Stain   Final    RARE WBC PRESENT, PREDOMINANTLY PMN NO ORGANISMS SEEN    Culture   Final    NO GROWTH < 24 HOURS Performed at [facility], [place] [address] [place], [place] [address]    Report Status PENDING  Incomplete      Radiology Studies: No results found.    LOS: 4 days    Flora Lipps, MD [facility]  02/05/2019, 11:17 AM

## 2019-02-05 NOTE — Progress Notes (Signed)
Nutrition Follow-up  DOCUMENTATION CODES:   Morbid obesity  INTERVENTION:  Continue 30 ml Prostat po BID, each supplement provides 100 kcal and 15 grams of protein.   Continue Glucerna Shake po once daily, each supplement provides 220 kcal and 10 grams of protein  Encourage adequate PO intake.   NUTRITION DIAGNOSIS:   Increased nutrient needs related to wound healing as evidenced by estimated needs; ongoing  GOAL:   Patient will meet greater than or equal to 90% of their needs; progressing  MONITOR:   PO intake, Supplement acceptance, Skin, Weight trends, Labs, I & O's  REASON FOR ASSESSMENT:   Consult Wound healing  ASSESSMENT:   33 y.o. female with medical history significant of morbid obesity (BMI 45); uncontrolled DM; h/o E coli sepsis; and HTN, non compliance  presenting with diabetic foot infection. Sepsis due to cellulitis associated with right foot ulcer, severe infection, osteomyelitis.  PROCEDURE (9/30): Transtibial amputation right with wound VAC.    Meal completion has been 50-100%. Pt currently has Glucerna shake and Prostat ordered and has been consuming them. RD to continue with current orders to aid in wound healing.   Labs and medications reviewed.   Diet Order:   Diet Order            Diet Carb Modified Fluid consistency: Thin; Room service appropriate? Yes  Diet effective now              EDUCATION NEEDS:   Not appropriate for education at this time  Skin:  Skin Assessment: Skin Integrity Issues: Skin Integrity Issues:: Incisions Incisions: R leg  Last BM:  9/30  Height:   Ht Readings from Last 1 Encounters:  02/04/19 5\' 2"  (1.575 m)    Weight:   Wt Readings from Last 1 Encounters:  02/04/19 112.5 kg    Ideal Body Weight:  50 kg  BMI:  Body mass index is 45.36 kg/m.  Estimated Nutritional Needs:   Kcal:  [id number]  Protein:  100-115 grams  Fluid:  >/= 1.8 L/day    Corrin Parker, MS, RD, LDN Pager #  [phone] After hours/ weekend pager # [phone]

## 2019-02-05 NOTE — Progress Notes (Signed)
Inpatient Rehab Admissions:  Inpatient Rehab Consult received.  I met with pt at the bedside for rehabilitation assessment. Pt highly motivated to return to [facility] and does seem interested in an intensive program IF she needed. Per pt she has already gotten up with PT this AM. Will await note to see how she did functionally and if CIR is needed. Also will need to await OT eval to determine if she has multidisciplinary needs.   Will follow closely for further assessment.   Jhonnie Garner, OTR/L  Rehab Admissions Coordinator  [phone] 02/05/2019 10:58 AM

## 2019-02-05 NOTE — Consult Note (Signed)
[facility]    Date of Admission:  01/31/2019     Total days of antibiotics 4   Day 4 vancomycin + metronidazole + aztreonam               Reason for Consult: Osteomyelitis of the R foot, Deep soft tissue abscess     Referring Provider: Pokhrel Primary Care Provider: Maximiano Coss, NP    Assessment: Sheila Austin is a 33 y.o. 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 She had secondary bacteremia with Streptococcus intermedius; superficial wound culture + klebsiella pneumoniae. Will narrow her to Ceftriaxone to cover both organisms, although suspect the streptococcus to be primary offender. For most aggressive approach would err on treating with 4 weeks IV antibiotics to try to help reduce risk for further/higher amputation for this young lady Austin how deep and complex the abscess was.  Will continue to follow micro from OR.  Will repeat blood cultures today although her leukocytosis is improving and fevers are resolved.  Discussed her diabetes needs to be in much better order to prevent these problems in the future - she is quite liberal with her diet.      Plan: 1. Change antibiotics to Ceftriaxone 2 gm IV q24h 2. Repeat blood cultures now.  3. Place PICC if #2 negative in 48 hours.     Principal Problem:   Diabetic foot ulcer with osteomyelitis ([place]) Active Problems:   Diabetes mellitus out of control ([place]ila [place])   Essential hypertension, benign   Gastroparesis   BMI 45.0-49.9, adult (Sheila [place])   Adjustment disorder   Sepsis due to cellulitis (Sheila Austin)   Skin ulcer of right foot with necrosis of muscle (Sheila Austin)   Severe protein-calorie malnutrition (Sheila [place])   Abscess of leg, right   . docusate sodium  100 mg Oral BID  . feeding supplement (GLUCERNA SHAKE)  237 mL Oral Q1500  . feeding supplement (PRO-STAT SUGAR FREE 64)   30 mL Oral BID  . heparin  5,000 Units Subcutaneous Q8H  . insulin aspart  0-20 Units Subcutaneous TID WC  . insulin aspart  0-5 Units Subcutaneous QHS  . insulin glargine  25 Units Subcutaneous Daily  . metoCLOPramide (REGLAN) injection  5 mg Intravenous Q6H  . pantoprazole  40 mg Oral Daily  . sodium chloride flush  3 mL Intravenous Q12H    HPI: Sheila Austin is a 33 y.o. female with pmhx of uncontrolled diabetes mellitus (supposed to be on insulin), fatty liver disease, hypertension, H. Pylori, GERD, Gastroparesis. 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 Non-smoker. Non-drinker. No drug use.   She has a history of developing an ulcer on the right lateral foot after hiking in flip flops over the summer in July; the blister popped while wading through a river and since then she has had trouble with swelling and drainage of her foot and progression to deep infection. She was admitted by Sheila Austin on 8/26 for IV antibiotics and evaluation for debridement, which she first underwent on 01/03/19 [place]). Vascular studies were within normal limits. She had to leave the hospital shortly after this was done due to social / caretaker needs for her family members and step children and continued with local wound care the next month following her debridement. She again began to develop cellulitis of the leg (this never  really went down following debridement) and was Austin a rx for cephalexin with orders for ongoing wound care. She was unable to fill this d/t PCN allergy concern and the pharmacy would not give to her.   Unfortunately she went on to develop severe nausea/vomiting, headaches and rigors which led her to present to ER on 9/26. She was found to be febrile with leukocytosis >30K with elevated lactate, acute on chronic kidney disease and hyperglycemia. Plain film of her right foot was concerning for osteomyelitis. She was started on vancomycin + aztreonam + metronidazole  IV. During this time blood cultures + streptococcus intermedius and superficial wound culture + klebsiella pneumoniae (R-cefazolin). 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 This was debrided and cleaned out. Intraoperative cultures are thusfar no growth.   In discussion with Sheila Austin she is quite remorseful over "not taking good care of herself." Tearful during the visit with pain and emotions. Tolerating her current antibiotics well without concern for side effect.   She has a history of being told she had a pcn allergy in childhood from her mother but has never been challenged as an adult. She does not recall what the allergic reaction was necessarily. She evidently received a dose of zosyn in the ER 10/02/2011 and tolerated without problem then.   CRP 40 baseline   Review of Systems: Review of Systems  Constitutional: Negative for chills, fever and malaise/fatigue.  Respiratory: Negative for cough and shortness of breath.   Cardiovascular: Positive for leg swelling. Negative for chest pain.  Gastrointestinal: Negative for abdominal pain and diarrhea.  Genitourinary: Negative for dysuria.  Musculoskeletal:       RLE pain   Skin: Negative for rash.  Neurological: Negative for dizziness.    Past Medical History:  Diagnosis Date  . Diabetes mellitus    uncontrolled, last A1c was 14  . E. coli sepsis ([place])   . Ectopic pregnancy   . Essential hypertension, benign 11/01/2008  . Fatty liver   . Gastroparesis   . GERD (gastroesophageal reflux disease)   . Helicobacter pylori gastritis 09/07/2011  . Migraine   . Morbid obesity with body mass index (BMI) of 40.0 to 49.9 ([place])   . Pulmonary edema   . Pyelonephritis   . Ureteral obstruction     Social History   Tobacco Use  . Smoking status: Never Smoker   . Smokeless tobacco: Never Used  Substance Use Topics  . Alcohol use: Not Currently  . Drug use: No    Family History  Problem Relation Age of Onset  . Arthritis Mother   . Asthma Mother   . COPD Mother   . Depression Mother   . Diabetes Mother   . Hypertension Mother   . Mental illness Mother   . Kidney disease Mother   . Crohn's disease Mother   . Aneurysm Father 48       brain  . Mental illness Sister   . Arthritis Maternal Aunt   . Asthma Maternal Aunt   . COPD Maternal Aunt   . Depression Maternal Aunt   . Diabetes Maternal Aunt   . Hypertension Maternal Aunt   . Mental illness Maternal Aunt   . Stroke Maternal Aunt   . Heart failure Maternal Aunt   . Heart failure Maternal  Grandfather   . Cancer Maternal Grandfather   . Diabetes Maternal Grandfather   . Heart disease Maternal Grandfather   . Hypertension Maternal Grandfather   . Hyperlipidemia Maternal Grandfather   . Mental illness Brother   . Diabetes Maternal Grandmother   . Heart disease Maternal Grandmother   . Hypertension Maternal Grandmother   . Hyperlipidemia Maternal Grandmother   . Diabetes Paternal Grandmother   . Heart disease Paternal Grandmother   . Diabetes Paternal Grandfather   . Cancer Paternal Grandfather    Allergies  Allergen Reactions  . Bee Venom Anaphylaxis, Hives, Shortness Of Breath and Swelling  . Penicillins Anaphylaxis    Has patient had a PCN reaction causing immediate rash, facial/tongue/throat swelling, SOB or lightheadedness with hypotension: yes Has patient had a PCN reaction causing severe rash involving mucus membranes or skin necrosis: unknown Has patient had a PCN reaction that required hospitalization : yes Has patient had a PCN reaction occurring within the last 10 years: no If all of the above answers are "NO", then may proceed with Cephalosporin use.     OBJECTIVE: Blood pressure (!) 143/96, pulse (!) 111, temperature 98.8 F (37.1 C), temperature source  Oral, resp. rate 17, height 5\' 2"  (1.575 m), weight 112.5 kg, last menstrual period 01/01/2019, SpO2 99 %.  Physical Exam HENT:     Head:     Comments: Very poor dentition, broken teeth.     Mouth/Throat:     Mouth: Mucous membranes are moist.     Pharynx: Oropharynx is clear.  Eyes:     General: No scleral icterus. Cardiovascular:     Rate and Rhythm: Normal rate.     Heart sounds: No murmur.  Abdominal:     General: Bowel sounds are normal. There is no distension.  Musculoskeletal:     Comments: RLE BKA site clean and dry dressing material.  Skin:    General: Skin is warm and dry.     Capillary Refill: Capillary refill takes less than 2 seconds.  Neurological:     Mental Status: She is alert and oriented to person, place, and time.     Lab Results Lab Results  Component Value Date   WBC 21.7 (H) 02/05/2019   HGB 7.2 (L) 02/05/2019   HCT 23.5 (L) 02/05/2019   MCV 86.7 02/05/2019   PLT 441 (H) 02/05/2019    Lab Results  Component Value Date   CREATININE 1.02 (H) 02/05/2019   BUN 21 (H) 02/05/2019   NA 131 (L) 02/05/2019   K 3.5 02/05/2019   CL 99 02/05/2019   CO2 22 02/05/2019    Lab Results  Component Value Date   ALT 12 02/01/2019   AST 15 02/01/2019   ALKPHOS 215 (H) 02/01/2019   BILITOT 1.2 02/01/2019     Microbiology: Recent Results (from the past 240 hour(s))  Blood culture (routine x 2)     Status: None (Preliminary result)   Collection Time: 02/01/19 12:19 AM   Specimen: BLOOD  Result Value Ref Range Status   Specimen Description BLOOD LEFT HAND  Final   Special Requests   Final    BOTTLES DRAWN AEROBIC ONLY Blood Culture adequate volume   Culture   Final    NO GROWTH 4 DAYS Performed at [facility], [address], [place], [place] [address]    Report Status PENDING  Incomplete  Urine culture     Status: Abnormal   Collection Time: 02/01/19 12:25 AM   Specimen:  Urine, Random  Result Value Ref Range Status   Specimen Description  URINE, RANDOM  Final   Special Requests   Final    NONE Performed at [facility], [address] [place], [place] [address]    Culture MULTIPLE SPECIES PRESENT, SUGGEST RECOLLECTION (A)  Final   Report Status 02/02/2019 FINAL  Final  Blood culture (routine x 2)     Status: Abnormal   Collection Time: 02/01/19 12:34 AM   Specimen: BLOOD  Result Value Ref Range Status   Specimen Description BLOOD RIGHT ARM  Final   Special Requests   Final    BOTTLES DRAWN AEROBIC AND ANAEROBIC Blood Culture results may not be optimal due to an excessive volume of blood received in culture bottles   Culture  Setup Time   Final    GRAM POSITIVE COCCI ANAEROBIC BOTTLE ONLY CRITICAL RESULT CALLED TO, READ BACK BY AND VERIFIED WITHBronwen Betters [facility] 2154 02/03/19 A BROWNING Performed at [facility], [place] [address], [place], [place] [address]    Culture STREPTOCOCCUS INTERMEDIUS (A)  Final   Report Status 02/05/2019 FINAL  Final   Organism ID, Bacteria STREPTOCOCCUS INTERMEDIUS  Final      Susceptibility   Streptococcus intermedius - MIC*    PENICILLIN <=0.06 SENSITIVE Sensitive     CEFTRIAXONE 0.25 SENSITIVE Sensitive     ERYTHROMYCIN >=8 RESISTANT Resistant     LEVOFLOXACIN 0.5 SENSITIVE Sensitive     VANCOMYCIN 0.5 SENSITIVE Sensitive     * STREPTOCOCCUS INTERMEDIUS  Blood Culture ID Panel (Reflexed)     Status: Abnormal   Collection Time: 02/01/19 12:34 AM  Result Value Ref Range Status   Enterococcus species NOT DETECTED NOT DETECTED Final   Listeria monocytogenes NOT DETECTED NOT DETECTED Final   Staphylococcus species NOT DETECTED NOT DETECTED Final   Staphylococcus aureus (BCID) NOT DETECTED NOT DETECTED Final   Streptococcus species DETECTED (A) NOT DETECTED Final    Comment: Not Enterococcus species, Streptococcus agalactiae, Streptococcus pyogenes, or Streptococcus pneumoniae. CRITICAL RESULT CALLED TO, READ BACK BY AND VERIFIED WITH: L CURRAN PHARMD 2154 02/03/19 A BROWNING     Streptococcus agalactiae NOT DETECTED NOT DETECTED Final   Streptococcus pneumoniae NOT DETECTED NOT DETECTED Final   Streptococcus pyogenes NOT DETECTED NOT DETECTED Final   Acinetobacter baumannii NOT DETECTED NOT DETECTED Final   Enterobacteriaceae species NOT DETECTED NOT DETECTED Final   Enterobacter cloacae complex NOT DETECTED NOT DETECTED Final   Escherichia coli NOT DETECTED NOT DETECTED Final   Klebsiella oxytoca NOT DETECTED NOT DETECTED Final   Klebsiella pneumoniae NOT DETECTED NOT DETECTED Final   Proteus species NOT DETECTED NOT DETECTED Final   Serratia marcescens NOT DETECTED NOT DETECTED Final   Haemophilus influenzae NOT DETECTED NOT DETECTED Final   Neisseria meningitidis NOT DETECTED NOT DETECTED Final   Pseudomonas aeruginosa NOT DETECTED NOT DETECTED Final   Candida albicans NOT DETECTED NOT DETECTED Final   Candida glabrata NOT DETECTED NOT DETECTED Final   Candida krusei NOT DETECTED NOT DETECTED Final   Candida parapsilosis NOT DETECTED NOT DETECTED Final   Candida tropicalis NOT DETECTED NOT DETECTED Final    Comment: Performed at [facility], [place] [address], [place], [place] [address]  SARS Coronavirus 2 [facility] order, Performed in [facility] hospital lab) Nasopharyngeal Nasopharyngeal Swab     Status: None   Collection Time: 02/01/19  3:50 AM   Specimen: Nasopharyngeal Swab  Result Value Ref Range Status   SARS  Coronavirus 2 NEGATIVE NEGATIVE Final    Comment: (NOTE) If result is NEGATIVE SARS-CoV-2 target nucleic acids are NOT DETECTED. The SARS-CoV-2 RNA is generally detectable in upper and lower  respiratory specimens during the acute phase of infection. The lowest  concentration of SARS-CoV-2 viral copies this assay can detect is 250  copies / mL. A negative result does not preclude SARS-CoV-2 infection  and should not be used as the sole basis for treatment or other  patient management decisions.  A negative result may occur with   improper specimen collection / handling, submission of specimen other  than nasopharyngeal swab, presence of viral mutation(s) within the  areas targeted by this assay, and inadequate number of viral copies  (<250 copies / mL). A negative result must be combined with clinical  observations, patient history, and epidemiological information. If result is POSITIVE SARS-CoV-2 target nucleic acids are DETECTED. The SARS-CoV-2 RNA is generally detectable in upper and lower  respiratory specimens dur ing the acute phase of infection.  Positive  results are indicative of active infection with SARS-CoV-2.  Clinical  correlation with patient history and other diagnostic information is  necessary to determine patient infection status.  Positive results do  not rule out bacterial infection or Austin-infection with other viruses. If result is PRESUMPTIVE POSTIVE SARS-CoV-2 nucleic acids MAY BE PRESENT.   A presumptive positive result was obtained on the submitted specimen  and confirmed on repeat testing.  While 2019 novel coronavirus  (SARS-CoV-2) nucleic acids may be present in the submitted sample  additional confirmatory testing may be necessary for epidemiological  and / or clinical management purposes  to differentiate between  SARS-CoV-2 and other Sarbecovirus currently known to infect humans.  If clinically indicated additional testing with an alternate test  methodology [phone]) is advised. The SARS-CoV-2 RNA is generally  detectable in upper and lower respiratory sp ecimens during the acute  phase of infection. The expected result is Negative. Fact Sheet for Patients:  StrictlyIdeas.no Fact Sheet for Healthcare Providers: BankingDealers.[place].za This test is not yet approved or cleared by the [place] FDA and has been authorized for detection and/or diagnosis of SARS-CoV-2 by FDA under an Emergency Use Authorization (EUA).  This EUA will  remain in effect (meaning this test can be used) for the duration of the COVID-19 declaration under Section 564(b)(1) of the Act, 21 U.S.C. section 360bbb-3(b)(1), unless the authorization is terminated or revoked sooner. Performed at [facility], [place] [address], [place], [place] [address]   Aerobic Culture (superficial specimen)     Status: None   Collection Time: 02/01/19 11:55 AM   Specimen: Wound  Result Value Ref Range Status   Specimen Description WOUND RIGHT FOOT  Final   Special Requests NONE  Final   Gram Stain   Final    RARE WBC PRESENT, PREDOMINANTLY PMN ABUNDANT GRAM POSITIVE COCCI ABUNDANT GRAM NEGATIVE RODS    Culture   Final    RARE KLEBSIELLA PNEUMONIAE RARE DIPHTHEROIDS(CORYNEBACTERIUM SPECIES) Standardized susceptibility testing for this organism is not available. WITH IN MIXED ORGANISMS Performed at [facility], [place] [address], [place], [place] [address]    Report Status 02/05/2019 FINAL  Final   Organism ID, Bacteria KLEBSIELLA PNEUMONIAE  Final      Susceptibility   Klebsiella pneumoniae - MIC*    AMPICILLIN >=32 RESISTANT Resistant     CEFAZOLIN >=64 RESISTANT Resistant     CEFEPIME <=1 SENSITIVE Sensitive     CEFTAZIDIME <=1 SENSITIVE  Sensitive     CEFTRIAXONE <=1 SENSITIVE Sensitive     CIPROFLOXACIN <=0.25 SENSITIVE Sensitive     GENTAMICIN 4 SENSITIVE Sensitive     IMIPENEM 2 SENSITIVE Sensitive     TRIMETH/SULFA <=20 SENSITIVE Sensitive     AMPICILLIN/SULBACTAM 16 INTERMEDIATE Intermediate     PIP/TAZO <=4 SENSITIVE Sensitive     Extended ESBL NEGATIVE Sensitive     * RARE KLEBSIELLA PNEUMONIAE  Surgical pcr screen     Status: None   Collection Time: 02/04/19  2:11 AM   Specimen: Nasal Mucosa; Nasal Swab  Result Value Ref Range Status   MRSA, PCR NEGATIVE NEGATIVE Final   Staphylococcus aureus NEGATIVE NEGATIVE Final    Comment: (NOTE) The Xpert SA Assay (FDA approved for NASAL specimens in patients 93 years of age and  older), is one component of a comprehensive surveillance program. It is not intended to diagnose infection nor to guide or monitor treatment. Performed at [facility], [place] [address], [place], [place] [address]   Aerobic/Anaerobic Culture (surgical/deep wound)     Status: None (Preliminary result)   Collection Time: 02/04/19 11:16 AM   Specimen: Soft Tissue, Other  Result Value Ref Range Status   Specimen Description TISSUE  Final   Special Requests NONE  Final   Gram Stain   Final    RARE WBC PRESENT, PREDOMINANTLY PMN NO ORGANISMS SEEN    Culture   Final    NO GROWTH < 24 HOURS Performed at [facility], [place] [address] [place], [place] [address]    Report Status PENDING  Incomplete    Janene Madeira, MSN, NP-C [facility] Cell: [phone] Pager: [phone]  02/05/2019 2:45 PM

## 2019-02-05 NOTE — Progress Notes (Signed)
Patient c/o itching to Bil arms and shoulders post po Amoxicillin, denies SOB or itching in throat. Alert and verbal. Resp even and unlabored. PRN IV benadryl administered. Will cont to monitor.

## 2019-02-05 NOTE — Progress Notes (Signed)
Patient ID: Sheila Austin, female   DOB: July 05, 1985, 33 y.o.   MRN: [id number] Postoperative day 1 right transtibial amputation.  Patient has a limb protector from [employer].  There is no drainage of the wound VAC canister.  Initial wound culture showed Klebsiella pneumonia.  Patient had abscess that extended up through the anterior and lateral compartment at the level of amputation.  There is new tissue cultures that were sent for this.  Patient will need at least 4 weeks of antibiotics to cover the Klebsiella pneumonia versus the cultures obtained intraoperatively.  Anticipate patient will need to go to CIR.

## 2019-02-06 DIAGNOSIS — Z978 Presence of other specified devices: Secondary | ICD-10-CM

## 2019-02-06 LAB — GLUCOSE, CAPILLARY
Glucose-Capillary: 145 mg/dL — ABNORMAL HIGH (ref 70–99)
Glucose-Capillary: 194 mg/dL — ABNORMAL HIGH (ref 70–99)
Glucose-Capillary: 156 mg/dL — ABNORMAL HIGH (ref 70–99)
Glucose-Capillary: 150 mg/dL — ABNORMAL HIGH (ref 70–99)

## 2019-02-06 LAB — BASIC METABOLIC PANEL
Anion gap: 9 (ref 5–15)
BUN: 18 mg/dL (ref 6–20)
CO2: 23 mmol/L (ref 22–32)
Calcium: 6.8 mg/dL — ABNORMAL LOW (ref 8.9–10.3)
Chloride: 101 mmol/L (ref 98–111)
Creatinine, Ser: 1.05 mg/dL — ABNORMAL HIGH (ref 0.44–1.00)
GFR calc Af Amer: >60 mL/min (ref >60)
GFR calc non Af Amer: >60 mL/min (ref >60)
Glucose, Bld: 165 mg/dL — ABNORMAL HIGH (ref 70–99)
Potassium: 3.6 mmol/L (ref 3.5–5.1)
Sodium: 133 mmol/L — ABNORMAL LOW (ref 135–145)

## 2019-02-06 LAB — CBC
HCT: 22.9 % — ABNORMAL LOW (ref 36.0–46.0)
Hemoglobin: 7 g/dL — ABNORMAL LOW (ref 12.0–15.0)
MCH: 26.4 pg (ref 26.0–34.0)
MCHC: 30.6 g/dL (ref 30.0–36.0)
MCV: 86.4 fL (ref 80.0–100.0)
Platelets: 475 10*3/uL — ABNORMAL HIGH (ref 150–400)
RBC: 2.65 MIL/uL — ABNORMAL LOW (ref 3.87–5.11)
RDW: 14.2 % (ref 11.5–15.5)
WBC: 16.5 10*3/uL — ABNORMAL HIGH (ref 4.0–10.5)
nRBC: 0 % (ref 0.0–0.2)

## 2019-02-06 LAB — MAGNESIUM: Magnesium: 1.5 mg/dL — ABNORMAL LOW (ref 1.7–2.4)

## 2019-02-06 LAB — CULTURE, BLOOD (ROUTINE X 2)
Culture: NO GROWTH
Special Requests: ADEQUATE

## 2019-02-06 MED ORDER — PANTOPRAZOLE SODIUM 40 MG PO TBEC: 40.0000 mg | DELAYED_RELEASE_TABLET | Freq: Every day | ORAL | Status: DC

## 2019-02-06 MED ORDER — GLUCERNA SHAKE PO LIQD: 237.0000 mL | Freq: Every day | ORAL | 0 refills | Status: DC

## 2019-02-06 MED ORDER — ONDANSETRON HCL 4 MG PO TABS: 4.0000 mg | ORAL_TABLET | Freq: Four times a day (QID) | ORAL | Status: DC | PRN

## 2019-02-06 MED ORDER — LANTUS SOLOSTAR 100 UNIT/ML ~~LOC~~ SOPN: 25.0000 [IU] | PEN_INJECTOR | Freq: Every day | SUBCUTANEOUS | 0 refills | Status: DC

## 2019-02-06 MED ORDER — OXYCODONE HCL 10 MG PO TABS: 10.0000 mg | ORAL_TABLET | ORAL | 0 refills | Status: DC | PRN

## 2019-02-06 MED ORDER — HEPARIN SODIUM (PORCINE) 5000 UNIT/ML IJ SOLN: 5000.0000 [IU] | Freq: Three times a day (TID) | INTRAMUSCULAR | Status: DC

## 2019-02-06 MED ORDER — OXYCODONE HCL 5 MG PO TABS: 5.0000 mg | ORAL_TABLET | ORAL | Status: DC | PRN

## 2019-02-06 MED ORDER — ACETAMINOPHEN 325 MG PO TABS: 325.0000 mg | ORAL_TABLET | Freq: Four times a day (QID) | ORAL | Status: DC | PRN

## 2019-02-06 MED ORDER — PRO-STAT SUGAR FREE PO LIQD: 30.0000 mL | Freq: Two times a day (BID) | ORAL | 0 refills | Status: DC

## 2019-02-06 MED ORDER — METHOCARBAMOL 500 MG PO TABS: 500.0000 mg | ORAL_TABLET | Freq: Four times a day (QID) | ORAL | Status: DC | PRN

## 2019-02-06 MED ORDER — SODIUM CHLORIDE 0.9 % IV SOLN: 2.0000 g | INTRAVENOUS | Status: DC

## 2019-02-06 MED ORDER — METOCLOPRAMIDE HCL 5 MG PO TABS: 5.0000 mg | ORAL_TABLET | Freq: Three times a day (TID) | ORAL | Status: DC | PRN

## 2019-02-06 MED ORDER — INSULIN ASPART 100 UNIT/ML ~~LOC~~ SOLN: 0.0000 [IU] | Freq: Three times a day (TID) | SUBCUTANEOUS | Status: DC

## 2019-02-06 MED ORDER — DOCUSATE SODIUM 100 MG PO CAPS: 100.0000 mg | ORAL_CAPSULE | Freq: Two times a day (BID) | ORAL | 0 refills | Status: DC
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
## 2019-02-06 NOTE — Discharge Summary (Addendum)
Physician Discharge Summary  Sheila Austin NOM:[id number] DOB: March 11, 1986 DOA: 01/31/2019  PCP: Maximiano Coss, NP  Admit date: 01/31/2019 Discharge date: 02/06/2019  Admitted From: Home  Discharge disposition: CIR on 02/07/19   Recommendations for Outpatient Follow-Up:   As per rehabilitation facility  Discharge Diagnosis:   Principal Problem:   Diabetic foot ulcer with osteomyelitis ([place]) Active Problems:   Diabetes mellitus out of control ([place])   Essential hypertension, benign   Gastroparesis   BMI 45.0-49.9, adult ([place])   Adjustment disorder   Sepsis due to cellulitis ([place])   Skin ulcer of right foot with necrosis of muscle ([facility])   Severe protein-calorie malnutrition ([place])   Abscess of leg, right   Sepsis with acute organ dysfunction without septic shock ([place])   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[place])   Streptococcal bacteremia   Klebsiella pneumoniae infection    Discharge Condition: Improved.  Diet recommendation:   Carbohydrate-modified.  Wound care: Continue at rehab  Code status: Full.   History of Present Illness:   Patient is a 33 y.o.femalewith medical history significant ofmorbid obesity (BMI 45); uncontrolled DM; h/o E coli sepsis; and HTN, non compliance presented to the hospital with diabetic foot infection. Her initial visit about this issue was 8/24, and she was admitted to the hospital 8/26-29. She had debridement on 8/29 and then left the hospital AMA. She was last seen for this issue by Dr. Scot Dock on 9/23. She had good arterial dopplers and was given Keflex. She came in this admission because of "very harsh nausea and vomiting" x 4 days. +fever yesterday intermittently, up to 99.9. She has a wound on her foot - it started in July as a blister. She got in a river and it busted open. She saw Dr. Scot Dock and she had debridement and she left AMA due to child care issues. 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  Status post below-knee amputation by orthopedic surgery, [place] on 02/04/2019.  Initially received aztreonam Flagyl vancomycin.  Infectious disease was consulted and patient is currently on Rocephin IV.  Orthopedics recommends 4 weeks of IV antibiotics.  Preop culture grew Klebsiella species and patient was bacteremic with strep intermedius bacteremia. Repeat blood culture from 10/ 1 no growth so far.  Operative culture no growth in 24 hours.   Please follow infectious disease recommendations on duration of antibiotic.  Patient was last seen by Dr. Tommy Medal on 02/06/2019.  Patient will need to follow-up with orthopedic as outpatient within 1 week.  Could consider a PICC line on 10/3 if afebrile and blood cultures are negative.  Last Labs          Recent Labs  Lab 02/02/19 0301 02/03/19 0546 02/04/19 0229 02/05/19 0212 02/06/19 0418  WBC 29.2* 26.5* 26.8* 21.7* 16.5*     Nausea vomiting likely due to gastroparesis.   Improved.  Continue antiemetics, as needed  Blood culture with Streptococcus intermedius 9/27,  .  Currently on Rocephin.  Hyponatremia .  Mild.  Sodium of 131 today.  Will need to monitor periodically.  Anemia likely from chronic disease.  Serum iron of 8 with TIBC as 109.  Ferritin of 495. transfuse if less than 7 g.  Latest hemoglobin of 7.0.  Might need blood transfusion if it continues to drift down.  Check CBC in a.m.  CKD stage IIIl: Baseline creatinine 1.5-1.9.  Latest creatinine of 1.05.  Has remained stable  Last Labs          Recent Labs  Lab 02/02/19 0301 02/03/19 0546 02/04/19 0229 02/05/19 0212 02/06/19 0418   BUN 27* 21* 23* 21* 18  CREATININE 1.40* 1.13* 1.09* 1.02* 1.05*     Essential hypertension: Not on any medication, stable.   Diabetic foot ulcer with osteomyelitis with uncontrolled diabetes.  Latest hemoglobin A1c 10.8. Cont Lantus, sliding scale, diabetic diet and closely monitor.  Relatively controlled blood glucose levels at this time.  Last Labs          Recent Labs  Lab 02/05/19 0655 02/05/19 1205 02/05/19 1623 02/05/19 2100 02/06/19 0618  GLUCAP 129* 133* 112* 139* 145*      BMI 45.0-49.9, adult: Would benefit from weight reduction.  Adjustment disorder-stable  Intra-abdominal lymphadenopathy seen incidentally on the CT scan with splenomegaly with multiple scattered enlarged retroperitoneal and right iliac and right inguinal lymph nodes.  Likely reactive in the setting of infection - will need follow-up as outpatient., possible  biopsy to rule out myeloproliferative disorder if not resolved with antibiotics and infection treatment  Nutrition: Nutrition Problem: Increased nutrient needs Etiology: wound healing Signs/Symptoms: estimated needs Interventions: Prostat, Glucerna shake  Disposition Plan:  Patient has been approved by inpatient rehab.  I have been asked by [facility] to make her discharge instructions ready for early morning admission to CIR on 02/07/2019.  Medical Consultants:   Orthopedics PMR  Subjective:   Today, patient still complains of discomfort at the amputated site.  Denies any nausea, vomiting or abdominal pain.  No fever chills or rigor.  Patient  stated that Dilaudid helped her rest better in the nighttime.   Discharge Exam:   Vitals:   02/06/19 0918 02/06/19 1330  BP: (!) 142/83 125/81  Pulse: (!) 104 (!) 104  Resp: 16 15  Temp: 99.2 F (37.3 C) 99.4 F (37.4 C)  SpO2: 96% 94%   Vitals:   02/05/19 2110 02/06/19 0338 02/06/19 0918 02/06/19 1330  BP: 138/82 132/80 (!) 142/83 125/81  Pulse: (!) 109 (!) 106 (!) 104 (!) 104    Resp: _0 Temp: 99.2 F (37.3 C) 99.6 F (37.6 C) 99.2 F (37.3 C) 99.4 F (37.4 C)  TempSrc: Oral Oral Oral Oral  SpO2: 93% 95% 96% 94%  Weight:      Height:        General: Obese, not in obvious distress HENT: Normocephalic, pupils equally reacting to light and accommodation.  No scleral pallor or icterus noted. Oral mucosa is moist.  Chest:  Clear breath sounds.  Diminished breath sounds bilaterally. No crackles or wheezes.  CVS: S1 &S2 heard. No murmur.  Regular rate and rhythm. Abdomen: Soft, nontender, nondistended.  Bowel sounds are heard.  Liver is not palpable, no abdominal mass palpated Extremities: Status post right foot below-knee amputation with dressing.  Wound VAC in place. Psych: Alert, awake and oriented, normal mood CNS:  No cranial nerve deficits.  Power equal in all extremities.  No sensory deficits noted.  No cerebellar signs.   Skin: Status post right below-knee amputation   Procedures:    Right transtibial amputation on 02/04/2019 by orthopedics Dr. Sharol Given  The results of significant diagnostics from this hospitalization (including imaging, microbiology, ancillary and laboratory) are listed below  for reference.     Diagnostic Studies:   Ct Abdomen Pelvis Wo Contrast  Result Date: 02/01/2019 CLINICAL DATA:  Initial evaluation for acute nausea, vomiting. EXAM: CT ABDOMEN AND PELVIS WITHOUT CONTRAST TECHNIQUE: Multidetector CT imaging of the abdomen and pelvis was performed following the standard protocol without IV contrast. COMPARISON:  Prior CT from 11/25/2014. FINDINGS: Lower chest: Visualized lung bases are clear. Hepatobiliary: Liver demonstrates a normal unenhanced appearance. Gallbladder within normal limits. No biliary dilatation. Pancreas: Pancreas within normal limits. Spleen: Spleen enlarged measuring 16 cm in AP diameter, increased from previous. Adrenals/Urinary Tract: Adrenal glands within normal limits. Kidneys equal in size without  evidence for nephrolithiasis or hydronephrosis. No ureterolithiasis or hydroureter. Partially distended bladder within normal limits. Stomach/Bowel: Stomach within normal limits. No evidence for bowel obstruction. Normal appendix. No acute inflammatory changes seen about the bowels. Vascular/Lymphatic: Intra-abdominal aorta of normal caliber. Mildly enlarged left periaortic lymph node measures 14 mm (series 3, image 42). Multiple additional scattered subcentimeter shotty retroperitoneal nodes noted. In the pelvis, there is an enlarged right common iliac node measuring 11 mm (series 3, image 67). Right external iliac nodes measure up to 16 mm (series 3, image 84). Right inguinal nodes measure up to 19 mm (series 3, image 103). Findings are of uncertain etiology, but new from previous. Reproductive: Uterus and ovaries within normal limits. Other: No free air or fluid. Musculoskeletal: Small focus of soft tissue density within the subcutaneous fat of the right anterior abdomen likely reflects an injection site. No acute osseous abnormality. No discrete lytic or blastic osseous lesions. IMPRESSION: 1. Interval development of splenomegaly with multiple scattered enlarged retroperitoneal, right iliac, and right inguinal lymph nodes as above, indeterminate. While these findings may be reactive in nature, possible lymphoproliferative disorder could also have this appearance. Correlation with laboratory values with possible histologic sampling suggested for further evaluation. 2. No other acute intra-abdominal or pelvic process. Electronically Signed   By: Jeannine Boga M.D.   On: 02/01/2019 05:06   Dg Chest 1 View  Result Date: 02/01/2019 CLINICAL DATA:  Initial evaluation for wound to right foot, history of recent surgery. EXAM: CHEST  1 VIEW COMPARISON:  Prior radiograph from 06/26/2017. FINDINGS: The cardiac and mediastinal silhouettes are stable in size and contour, and remain within normal limits. The lungs  are normally inflated. No airspace consolidation, pleural effusion, or pulmonary edema. No pneumothorax. No acute osseous abnormality. IMPRESSION: No active disease. Electronically Signed   By: Jeannine Boga M.D.   On: 02/01/2019 02:24   Dg Ankle Complete Right  Result Date: 02/01/2019 CLINICAL DATA:  Wound lateral right foot. Ulcer. History of recent foot surgery. EXAM: RIGHT ANKLE - COMPLETE 3+ VIEW COMPARISON:  None. FINDINGS: Soft tissue defect about the lateral aspect of the fifth metatarsal, better assessed on concurrent foot exam. Generalized soft tissue edema about the ankle, possible patchy soft tissue air laterally. Periosteal thickening of the proximal fifth metatarsal better assessed on foot radiograph. No bony destructive change about the ankle. The ankle mortise is preserved. No fracture or dislocation. IMPRESSION: 1. Soft tissue defect about the lateral aspect of the fifth metatarsal, better assessed on concurrent foot exam. 2. Generalized soft tissue about the ankle with patchy air in the soft tissues laterally, may be related to recent surgery. Electronically Signed   By: Keith Rake M.D.   On: 02/01/2019 02:46   Mr Foot Right W Wo Contrast  Result Date: 02/01/2019 CLINICAL DATA:  Diabetic wound along the lateral aspect of the  foot. Diffuse soft tissue swelling. EXAM: MRI OF THE RIGHT FOREFOOT WITHOUT AND WITH CONTRAST TECHNIQUE: Multiplanar, multisequence MR imaging of the right foot was performed before and after the administration of intravenous contrast. CONTRAST:  43m GADAVIST GADOBUTROL 1 MMOL/ML IV SOLN COMPARISON:  Radiographs 02/01/2019 FINDINGS: There is a large open wound along the lateral plantar aspect of the foot near the base of the fifth metatarsal. There is severe diffuse subcutaneous soft tissue swelling/edema/fluid suggesting severe cellulitis. There is also scattered areas of gas throughout the soft tissues most notably along the lateral aspect of the  hindfoot adjacent to the calcaneus and cuboid. This could be air related to recent surgery or from the open wound but gas-forming bacteria infection is also possible. Severe diffuse myofasciitis throughout the foot with some scattered gas noted in the short flexor muscles. Findings consistent with pyomyositis. This most notably involves the abductor digiti minimi muscle. Diffuse abnormal signal intensity and enhancement in the fifth metatarsal, cuboid and calcaneus consistent with osteomyelitis. There is also likely involvement of the lateral cuneiform and base of the fourth metatarsal. Complex tibiotalar joint effusion. I do not see any obvious destructive bony changes but there is moderate synovial enhancement and findings are certainly worrisome for septic arthritis. I do not see any definite findings for septic arthritis involving the subtalar joints. The sinus tarsi appears normal. IMPRESSION: 1. Severe diffuse cellulitis and myofasciitis. 2. Large open wound along the lateral aspect of the foot communicating with several rim enhancing fluid collections, likely dissecting abscesses. Some of these contain gas and this is most notable around the lateral aspect of the cuboid and calcaneus. 3. Pyomyositis involving the short flexor muscles of the foot. 4. Osteomyelitis involving the fifth metatarsal, the cuboid and calcaneus. I think is also involvement of the lateral cuneiform and base of the fourth metatarsal. 5. Complex tibiotalar joint effusion.  Septic arthritis is likely. 6. Septic tenosynovitis involving the peroneal tendons. Electronically Signed   By: PMarijo SanesM.D.   On: 02/01/2019 14:46   Dg Foot Complete Right  Result Date: 02/01/2019 CLINICAL DATA:  Wound lateral right foot. Ulcer. Recent foot surgery. EXAM: RIGHT FOOT COMPLETE - 3+ VIEW COMPARISON:  None. FINDINGS: Soft tissue defect about the lateral foot adjacent to the proximal fifth metatarsal. There is periosteal reaction at the fifth  metatarsal base. No other bony destructive or periosteal change. No acute fracture. Diffuse generalized soft tissue edema about the foot and ankle. IMPRESSION: 1. Soft tissue defect about the lateral foot with subjacent periosteal reaction at the base of the fifth metatarsal, suspicious for osteomyelitis. 2. Diffuse soft tissue edema about the foot and ankle. Electronically Signed   By: MKeith RakeM.D.   On: 02/01/2019 02:47     Labs:   Basic Metabolic Panel: Recent Labs  Lab 02/02/19 0301 02/03/19 0546 02/04/19 0229 02/05/19 0212 02/06/19 0418  NA 133* 133* 130* 131* 133*  K 3.3* 3.3* 3.7 3.5 3.6  CL 93* 98 99 99 101  CO2 _0 GLUCOSE 290* 182* 114* 148* 165*  BUN 27* 21* 23* 21* 18  CREATININE 1.40* 1.13* 1.09* 1.02* 1.05*  CALCIUM 7.7* 7.2* 6.9* 6.9* 6.8*  MG  --   --   --   --  1.5*   GFR Estimated Creatinine Clearance: 92 mL/min (A) (by C-G formula based on SCr of 1.05 mg/dL (H)). Liver Function Tests: Recent Labs  Lab 02/01/19 0033  AST 15  ALT 12  ALKPHOS 215*  BILITOT 1.2  PROT 7.9  ALBUMIN 2.0*   No results for input(s): LIPASE, AMYLASE in the last 168 hours. No results for input(s): AMMONIA in the last 168 hours. Coagulation profile Recent Labs  Lab 02/01/19 0150  INR 1.2    CBC: Recent Labs  Lab 02/01/19 0033  02/02/19 0301 02/03/19 0546 02/03/19 2027 02/04/19 0229 02/05/19 0212 02/06/19 0418  WBC 35.6*  --  29.2* 26.5*  --  26.8* 21.7* 16.5*  NEUTROABS 32.3*  --   --   --   --   --   --   --   HGB 9.5*   < > 8.1* 7.4* 8.4* 7.9* 7.2* 7.0*  HCT 28.7*   < > 24.9* 23.2* 25.5* 25.3* 23.5* 22.9*  MCV 84.2  --  85.6 86.2  --  86.1 86.7 86.4  PLT 461*  --  385 380  --  411* 441* 475*   < > = values in this interval not displayed.   Cardiac Enzymes: No results for input(s): CKTOTAL, CKMB, CKMBINDEX, TROPONINI in the last 168 hours. BNP: Invalid input(s): POCBNP CBG: Recent Labs  Lab 02/05/19 1623 02/05/19 2100 02/06/19 0618  02/06/19 1219 02/06/19 1656  GLUCAP 112* 139* 145* 194* 156*   D-Dimer No results for input(s): DDIMER in the last 72 hours. Hgb A1c No results for input(s): HGBA1C in the last 72 hours. Lipid Profile No results for input(s): CHOL, HDL, LDLCALC, TRIG, CHOLHDL, LDLDIRECT in the last 72 hours. Thyroid function studies No results for input(s): TSH, T4TOTAL, T3FREE, THYROIDAB in the last 72 hours.  Invalid input(s): FREET3 Anemia work up [employer]    02/05/19 0212  VITAMINB12 537  FOLATE 4.0*  FERRITIN 495*  TIBC 109*  IRON 8*  RETICCTPCT 1.4   Microbiology Recent Results (from the past 240 hour(s))  Blood culture (routine x 2)     Status: None   Collection Time: 02/01/19 12:19 AM   Specimen: BLOOD  Result Value Ref Range Status   Specimen Description BLOOD LEFT HAND  Final   Special Requests   Final    BOTTLES DRAWN AEROBIC ONLY Blood Culture adequate volume   Culture   Final    NO GROWTH 5 DAYS Performed at [facility], [address], [place], [place] [address]    Report Status 02/06/2019 FINAL  Final  Urine culture     Status: Abnormal   Collection Time: 02/01/19 12:25 AM   Specimen: Urine, Random  Result Value Ref Range Status   Specimen Description URINE, RANDOM  Final   Special Requests   Final    NONE Performed at [facility], [place] [address] [place], [place] [address]    Culture MULTIPLE SPECIES PRESENT, SUGGEST RECOLLECTION (A)  Final   Report Status 02/02/2019 FINAL  Final  Blood culture (routine x 2)     Status: Abnormal   Collection Time: 02/01/19 12:34 AM   Specimen: BLOOD  Result Value Ref Range Status   Specimen Description BLOOD RIGHT ARM  Final   Special Requests   Final    BOTTLES DRAWN AEROBIC AND ANAEROBIC Blood Culture results may not be optimal due to an excessive volume of blood received in culture bottles   Culture  Setup Time   Final    GRAM POSITIVE COCCI ANAEROBIC BOTTLE ONLY CRITICAL RESULT CALLED TO, READ BACK BY  AND VERIFIED WITHBronwen Betters [facility] 2154 02/03/19 A BROWNING Performed at [facility], [place],  [place] [address]    Culture STREPTOCOCCUS INTERMEDIUS (A)  Final   Report Status 02/05/2019 FINAL  Final   Organism ID, Bacteria STREPTOCOCCUS INTERMEDIUS  Final      Susceptibility   Streptococcus intermedius - MIC*    PENICILLIN <=0.06 SENSITIVE Sensitive     CEFTRIAXONE 0.25 SENSITIVE Sensitive     ERYTHROMYCIN >=8 RESISTANT Resistant     LEVOFLOXACIN 0.5 SENSITIVE Sensitive     VANCOMYCIN 0.5 SENSITIVE Sensitive     * STREPTOCOCCUS INTERMEDIUS  Blood Culture ID Panel (Reflexed)     Status: Abnormal   Collection Time: 02/01/19 12:34 AM  Result Value Ref Range Status   Enterococcus species NOT DETECTED NOT DETECTED Final   Listeria monocytogenes NOT DETECTED NOT DETECTED Final   Staphylococcus species NOT DETECTED NOT DETECTED Final   Staphylococcus aureus (BCID) NOT DETECTED NOT DETECTED Final   Streptococcus species DETECTED (A) NOT DETECTED Final    Comment: Not Enterococcus species, Streptococcus agalactiae, Streptococcus pyogenes, or Streptococcus pneumoniae. CRITICAL RESULT CALLED TO, READ BACK BY AND VERIFIED WITH: L CURRAN PHARMD 2154 02/03/19 A BROWNING    Streptococcus agalactiae NOT DETECTED NOT DETECTED Final   Streptococcus pneumoniae NOT DETECTED NOT DETECTED Final   Streptococcus pyogenes NOT DETECTED NOT DETECTED Final   Acinetobacter baumannii NOT DETECTED NOT DETECTED Final   Enterobacteriaceae species NOT DETECTED NOT DETECTED Final   Enterobacter cloacae complex NOT DETECTED NOT DETECTED Final   Escherichia coli NOT DETECTED NOT DETECTED Final   Klebsiella oxytoca NOT DETECTED NOT DETECTED Final   Klebsiella pneumoniae NOT DETECTED NOT DETECTED Final   Proteus species NOT DETECTED NOT DETECTED Final   Serratia marcescens NOT DETECTED NOT DETECTED Final   Haemophilus influenzae NOT DETECTED NOT DETECTED Final   Neisseria meningitidis NOT  DETECTED NOT DETECTED Final   Pseudomonas aeruginosa NOT DETECTED NOT DETECTED Final   Candida albicans NOT DETECTED NOT DETECTED Final   Candida glabrata NOT DETECTED NOT DETECTED Final   Candida krusei NOT DETECTED NOT DETECTED Final   Candida parapsilosis NOT DETECTED NOT DETECTED Final   Candida tropicalis NOT DETECTED NOT DETECTED Final    Comment: Performed at [facility], [place] [address], [place], [place] [address]  SARS Coronavirus 2 [facility] order, Performed in [facility] hospital lab) Nasopharyngeal Nasopharyngeal Swab     Status: None   Collection Time: 02/01/19  3:50 AM   Specimen: Nasopharyngeal Swab  Result Value Ref Range Status   SARS Coronavirus 2 NEGATIVE NEGATIVE Final    Comment: (NOTE) If result is NEGATIVE SARS-CoV-2 target nucleic acids are NOT DETECTED. The SARS-CoV-2 RNA is generally detectable in upper and lower  respiratory specimens during the acute phase of infection. The lowest  concentration of SARS-CoV-2 viral copies this assay can detect is 250  copies / mL. A negative result does not preclude SARS-CoV-2 infection  and should not be used as the sole basis for treatment or other  patient management decisions.  A negative result may occur with  improper specimen collection / handling, submission of specimen other  than nasopharyngeal swab, presence of viral mutation(s) within the  areas targeted by this assay, and inadequate number of viral copies  (<250 copies / mL). A negative result must be combined with clinical  observations, patient history, and epidemiological information. If result is POSITIVE SARS-CoV-2 target nucleic acids are DETECTED. The SARS-CoV-2 RNA is generally detectable in upper and lower  respiratory specimens dur ing the acute phase of infection.  Positive  results are  indicative of active infection with SARS-CoV-2.  Clinical  correlation with patient history and other diagnostic information is  necessary to determine  patient infection status.  Positive results do  not rule out bacterial infection or co-infection with other viruses. If result is PRESUMPTIVE POSTIVE SARS-CoV-2 nucleic acids MAY BE PRESENT.   A presumptive positive result was obtained on the submitted specimen  and confirmed on repeat testing.  While 2019 novel coronavirus  (SARS-CoV-2) nucleic acids may be present in the submitted sample  additional confirmatory testing may be necessary for epidemiological  and / or clinical management purposes  to differentiate between  SARS-CoV-2 and other Sarbecovirus currently known to infect humans.  If clinically indicated additional testing with an alternate test  methodology [phone]) is advised. The SARS-CoV-2 RNA is generally  detectable in upper and lower respiratory sp ecimens during the acute  phase of infection. The expected result is Negative. Fact Sheet for Patients:  StrictlyIdeas.no Fact Sheet for Healthcare Providers: BankingDealers.co.za This test is not yet approved or cleared by the [place] FDA and has been authorized for detection and/or diagnosis of SARS-CoV-2 by FDA under an Emergency Use Authorization (EUA).  This EUA will remain in effect (meaning this test can be used) for the duration of the COVID-19 declaration under Section 564(b)(1) of the Act, 21 U.S.C. section 360bbb-3(b)(1), unless the authorization is terminated or revoked sooner. Performed at [facility], [place] [address], [place], [place] [address]   Aerobic Culture (superficial specimen)     Status: None   Collection Time: 02/01/19 11:55 AM   Specimen: Wound  Result Value Ref Range Status   Specimen Description WOUND RIGHT FOOT  Final   Special Requests NONE  Final   Gram Stain   Final    RARE WBC PRESENT, PREDOMINANTLY PMN ABUNDANT GRAM POSITIVE COCCI ABUNDANT GRAM NEGATIVE RODS    Culture   Final    RARE KLEBSIELLA PNEUMONIAE RARE  DIPHTHEROIDS(CORYNEBACTERIUM SPECIES) Standardized susceptibility testing for this organism is not available. WITH IN MIXED ORGANISMS Performed at [facility], [place] [address], [place], [place] [address]    Report Status 02/05/2019 FINAL  Final   Organism ID, Bacteria KLEBSIELLA PNEUMONIAE  Final      Susceptibility   Klebsiella pneumoniae - MIC*    AMPICILLIN >=32 RESISTANT Resistant     CEFAZOLIN >=64 RESISTANT Resistant     CEFEPIME <=1 SENSITIVE Sensitive     CEFTAZIDIME <=1 SENSITIVE Sensitive     CEFTRIAXONE <=1 SENSITIVE Sensitive     CIPROFLOXACIN <=0.25 SENSITIVE Sensitive     GENTAMICIN 4 SENSITIVE Sensitive     IMIPENEM 2 SENSITIVE Sensitive     TRIMETH/SULFA <=20 SENSITIVE Sensitive     AMPICILLIN/SULBACTAM 16 INTERMEDIATE Intermediate     PIP/TAZO <=4 SENSITIVE Sensitive     Extended ESBL NEGATIVE Sensitive     * RARE KLEBSIELLA PNEUMONIAE  Surgical pcr screen     Status: None   Collection Time: 02/04/19  2:11 AM   Specimen: Nasal Mucosa; Nasal Swab  Result Value Ref Range Status   MRSA, PCR NEGATIVE NEGATIVE Final   Staphylococcus aureus NEGATIVE NEGATIVE Final    Comment: (NOTE) The Xpert SA Assay (FDA approved for NASAL specimens in patients 78 years of age and older), is one component of a comprehensive surveillance program. It is not intended to diagnose infection nor to guide or monitor treatment. Performed at [facility], [place] [address], [place], [place] [address]   Aerobic/Anaerobic Culture (  surgical/deep wound)     Status: None (Preliminary result)   Collection Time: 02/04/19 11:16 AM   Specimen: Soft Tissue, Other  Result Value Ref Range Status   Specimen Description TISSUE  Final   Special Requests NONE  Final   Gram Stain   Final    RARE WBC PRESENT, PREDOMINANTLY PMN NO ORGANISMS SEEN Performed at [facility], [place] [address], [place], [place] [address]    Culture   Final    CULTURE REINCUBATED FOR BETTER GROWTH NO  ANAEROBES ISOLATED; CULTURE IN PROGRESS FOR 5 DAYS    Report Status PENDING  Incomplete  Culture, blood (routine x 2)     Status: None (Preliminary result)   Collection Time: 02/05/19 11:41 AM   Specimen: BLOOD  Result Value Ref Range Status   Specimen Description BLOOD RIGHT ANTECUBITAL  Final   Special Requests   Final    BOTTLES DRAWN AEROBIC AND ANAEROBIC Blood Culture results may not be optimal due to an excessive volume of blood received in culture bottles   Culture   Final    NO GROWTH < 24 HOURS Performed at [facility], [place] [address], [place], [place] [address]    Report Status PENDING  Incomplete  Culture, blood (routine x 2)     Status: None (Preliminary result)   Collection Time: 02/05/19 11:51 AM   Specimen: BLOOD  Result Value Ref Range Status   Specimen Description BLOOD BLOOD LEFT HAND  Final   Special Requests   Final    BOTTLES DRAWN AEROBIC AND ANAEROBIC Blood Culture adequate volume   Culture   Final    NO GROWTH < 24 HOURS Performed at [facility], [place] [address] [place], [place] [address]    Report Status PENDING  Incomplete     Discharge Instructions:   Discharge Instructions    Diet Carb Modified   Complete by: As directed    Discharge instructions   Complete by: As directed    As per CIR   Increase activity slowly   Complete by: As directed      Allergies as of 02/06/2019      Reactions   Bee Venom Anaphylaxis, Hives, Shortness Of Breath, Swelling   Penicillins Itching   Has patient had a PCN reaction causing immediate rash, facial/tongue/throat swelling, SOB or lightheadedness with hypotension: yes Has patient had a PCN reaction causing severe rash involving mucus membranes or skin necrosis: unknown Has patient had a PCN reaction that required hospitalization : yes Has patient had a PCN reaction occurring within the last 10 years: no If all of the above answers are "NO", then may proceed with Cephalosporin use.        Medication List    STOP taking these medications   cephALEXin 500 MG capsule Commonly known as: KEFLEX   sulfamethoxazole-trimethoprim 400-80 MG tablet Commonly known as: BACTRIM     TAKE these medications   acetaminophen 325 MG tablet Commonly known as: TYLENOL Take 1-2 tablets (325-650 mg total) by mouth every 6 (six) hours as needed for mild pain (pain score 1-3 or temp > 100.5).   blood glucose meter kit and supplies Dispense based on patient and insurance preference. Use up to four times daily as directed. (FOR ICD-10 E10.9, E11.9).   cefTRIAXone 2 g in sodium chloride 0.9 % 100 mL Inject 2 g into the vein daily. Start taking on: February 07, 2019   docusate sodium 100 MG capsule Commonly known  as: COLACE Take 1 capsule (100 mg total) by mouth 2 (two) times daily.   escitalopram 10 MG tablet Commonly known as: Lexapro Take 1 tablet (10 mg total) by mouth daily.   feeding supplement (GLUCERNA SHAKE) Liqd Take 237 mLs by mouth daily at 3 pm. Start taking on: February 07, 2019   feeding supplement (PRO-STAT SUGAR FREE 64) Liqd Take 30 mLs by mouth 2 (two) times daily.   heparin 5000 UNIT/ML injection Inject 1 mL (5,000 Units total) into the skin every 8 (eight) hours.   insulin aspart 100 UNIT/ML injection Commonly known as: novoLOG Inject 0-20 Units into the skin 3 (three) times daily with meals. Start taking on: February 07, 2019   insulin lispro 100 UNIT/ML KwikPen Commonly known as: HumaLOG KwikPen Inject 0.08 mLs (8 Units total) into the skin 3 (three) times daily before meals.   Insulin Pen Needle 31G X 6 MM Misc 1 pen by Does not apply route as directed.   Lantus SoloStar 100 UNIT/ML Solostar Pen Generic drug: Insulin Glargine Inject 25 Units into the skin daily. What changed: how much to take   methocarbamol 500 MG tablet Commonly known as: ROBAXIN Take 1 tablet (500 mg total) by mouth every 6 (six) hours as needed for muscle spasms.   metoCLOPramide  5 MG tablet Commonly known as: REGLAN Take 1-2 tablets (5-10 mg total) by mouth every 8 (eight) hours as needed for nausea (if ondansetron (ZOFRAN) ineffective.).   ondansetron 4 MG tablet Commonly known as: ZOFRAN Take 1 tablet (4 mg total) by mouth every 6 (six) hours as needed for nausea.   Oxycodone HCl 10 MG Tabs Take 1-1.5 tablets (10-15 mg total) by mouth every 4 (four) hours as needed for severe pain (pain score 7-10).   oxyCODONE 5 MG immediate release tablet Commonly known as: Oxy IR/ROXICODONE Take 1-2 tablets (5-10 mg total) by mouth every 4 (four) hours as needed for moderate pain (pain score 4-6).   pantoprazole 40 MG tablet Commonly known as: PROTONIX Take 1 tablet (40 mg total) by mouth daily. Start taking on: February 07, 2019      Follow-up Information    Newt Minion, MD In 1 week.   Specialty: Orthopedic Surgery Contact information: [place] [address] [phone]            Time coordinating discharge: 39 minutes  Signed:  Albi Rappaport  [facility] 02/06/2019, 5:48 PM

## 2019-02-06 NOTE — Progress Notes (Addendum)
Subjective: 2 Days Post-Op Procedure(s) (LRB): RIGHT BELOW KNEE AMPUTATION (Right) Patient reports pain as mild and moderate.   Unable to tolerate limb protector as residual limb too large for protector at this point.  Objective: Vital signs in last 24 hours: Temp:  [99.2 F (37.3 C)-100.9 F (38.3 C)] 99.6 F (37.6 C) (10/02 0338) Pulse Rate:  [106-109] 106 (10/02 0338) Resp:  [15-20] 20 (10/02 0338) BP: (119-138)/(71-82) 132/80 (10/02 0338) SpO2:  [93 %-96 %] 95 % (10/02 0338)  Intake/Output from previous day: 10/01 0701 - 10/02 0700 In: 360 [P.O.:360] Out: 702 [Urine:702] Intake/Output this shift: No intake/output data recorded.  Recent Labs    02/03/19 2027 02/04/19 0229 02/05/19 0212 02/06/19 0418  HGB 8.4* 7.9* 7.2* 7.0*   Recent Labs    02/05/19 0212 02/06/19 0418  WBC 21.7* 16.5*  RBC 2.71*  2.71* 2.65*  HCT 23.5* 22.9*  PLT 441* 475*   Recent Labs    02/05/19 0212 02/06/19 0418  NA 131* 133*  K 3.5 3.6  CL 99 101  CO2 22 23  BUN 21* 18  CREATININE 1.02* 1.05*  GLUCOSE 148* 165*  CALCIUM 6.9* 6.8*   No results for input(s): LABPT, INR in the last 72 hours.  Right BKA site with VAC dressing in place and functioning well. No drainage in VAC canister.    Assessment/Plan: 2 Days Post-Op Procedure(s) (LRB): RIGHT BELOW KNEE AMPUTATION (Right) S/P Right BKA- Continue VAC therapy and plan Prevena VAC at DC ID- ID following. Will need 4 weeks of IV antibiotic rx as infection extended to the amputation site. Pre op cx grew Klebsiella p. sps and was bactermic with Strep intermedius bacteremia.  Operative cx showed no organisms/NG at 24 hrs.  DM- Management per IM CKD- per IM VTE risk- SQ Heparin DISPO- CIR vs [facility] SHAWN RAYBURN, PA-C 02/06/2019, 8:49 AM  [facility] [phone]

## 2019-02-06 NOTE — Progress Notes (Signed)
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 PMH includes DM not medically managed, gastroparesis, obesity, R nonhealing foot wound with I&D 01/03/19, HTN, migraines.    PT Comments    Pt tolerated treatment well, progressing to gait and wheelchair mobility with limited PT assistance. Pt demonstrates improved transfer quality and less anxiety during OOB activity this session. Pt will continue to benefit from PT POC to increase endurance and power, and improve upon gait, functional mobility, and balance. Recommendations: CIR   Follow Up Recommendations  CIR;Supervision/Assistance - 24 hour     Equipment Recommendations  Rolling walker with 5" wheels;Wheelchair (measurements PT);Wheelchair cushion (measurements PT);3in1 (PT)    Recommendations for Other Services       Precautions / Restrictions Precautions Precautions: Fall Precaution Comments: wound VAC Required Braces or Orthoses: Other Brace Other Brace: R residual limb protector(does not fit per pt report, not utilized this session) Restrictions Weight Bearing Restrictions: Yes RLE Weight Bearing: Non weight bearing    Mobility  Bed Mobility Overal bed mobility: Needs Assistance Bed Mobility: Supine to Sit     Supine to sit: Supervision     General bed mobility comments: supervision for safety  Transfers Overall transfer level: Needs assistance Equipment used: Rolling walker (2 wheeled) Transfers: Sit to/from Stand Sit to Stand: Min guard Stand pivot transfers: Min guard       General transfer comment: Stand step turn transfer performed with RW and min guard assist  Ambulation/Gait Ambulation/Gait assistance: Min guard Gait  Distance (Feet): 10 Feet(2 trials of 10') Assistive device: Rolling walker (2 wheeled) Gait Pattern/deviations: (hop to gait) Gait velocity: reduced Gait velocity interpretation: <1.31 ft/sec, indicative of household ambulator General Gait Details: hop to gait with increased step length during 2nd gait trial   Theme park manager mobility: Yes Wheelchair propulsion: Both upper extremities Wheelchair parts: Supervision/cueing Distance: 200 Wheelchair Assistance Details (indicate cue type and reason): PT providing verbal cues for safety and efficiency during turns  Modified Rankin (Stroke Patients Only)       Balance Overall balance assessment: Needs assistance Sitting-balance support: Feet supported;Bilateral upper extremity supported Sitting balance-Leahy Scale: Good     Standing balance support: Bilateral upper extremity supported Standing balance-Leahy Scale: Fair                              Cognition Arousal/Alertness: Awake/alert Behavior During Therapy: Anxious Overall Cognitive Status: Within Functional Limits for tasks assessed                                        Exercises      General Comments        Pertinent Vitals/Pain Pain Assessment: Faces Pain Score: 2  Faces Pain Scale: Hurts a little bit Pain Location: R residual limb Pain Descriptors / Indicators: Discomfort Pain Intervention(s): Limited activity within patient's tolerance    Home Living Family/patient expects to be discharged to:: Private residence Living Arrangements: Spouse/significant other Available Help at Discharge: Family Type of Home: Mobile home Home  Access: Stairs to enter   Home Layout: One level Home Equipment: Cane - single point;Crutches;Shower seat      Prior Function Level of Independence: Independent with assistive device(s)      Comments: pt reports using cane and single crutch  interchangeably as needed for ambulation PTA, 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 Pt with 64 Y/o daughter, fiance works and family are pt's neighbors   PT Goals (current goals can now be found in the care plan section) Acute Rehab PT Goals Patient Stated Goal: return to independence Progress towards PT goals: Progressing toward goals    Frequency    Min 5X/week      PT Plan Current plan remains appropriate    Co-evaluation              AM-PAC PT "6 Clicks" Mobility   Outcome Measure  Help needed turning from your back to your side while in a flat bed without using bedrails?: A Little Help needed moving from lying on your back to sitting on the side of a flat bed without using bedrails?: A Little Help needed moving to and from a bed to a chair (including a wheelchair)?: A Little Help needed standing up from a chair using your arms (e.g., wheelchair or bedside chair)?: A Little Help needed to walk in hospital room?: A Little Help needed climbing 3-5 steps with a railing? : A Lot 6 Click Score: 17    End of Session Equipment Utilized During Treatment: Gait belt Activity Tolerance: Patient tolerated treatment well Patient left: in chair;with call bell/phone within reach Nurse Communication: Mobility status PT Visit Diagnosis: Other abnormalities of gait and mobility (R26.89);Muscle weakness (generalized) (M62.81);Pain Pain - Right/Left: Right Pain - part of body: Leg     Time: [id number] PT Time Calculation (min) (ACUTE ONLY): 40 min  Charges:  $Gait Training: 8-22 mins $Therapeutic Activity: 8-22 mins $Wheel Chair Management: 8-22 mins                     Zenaida Niece, PT, DPT Acute Rehabilitation Pager: [phone]    Zenaida Niece 02/06/2019, 11:21 AM

## 2019-02-06 NOTE — H&P (Addendum)
Physical Medicine and Rehabilitation Admission H&P    Chief Complaint  Patient presents with  . Emesis    HPI: Sheila Austin is a 33 year old female with history of T2DM,  HTN, medical non-compliance for years, recent depressive episode due to loss of mother, morbid obesity , fatty liver who developed a blister this summer that ruptured with onset of left food ulcer treated with I & D 12/31/18 who left AMA post procedure due to family issues. She continued to have poor wound healing despite conservative care and was admitted on  01/31/2019 due to progressive nausea, vomiting, drainage, foot pain, and weakness. She was found to be septic and treated with IVF as well broad spectrum antibiotics. MRI foot showed severe diffuse cellulitis with myofascitis, osteomyelitis of fifth metatarsal, cuboid and calcaneus as well as abscess extending of the peroneal tendon.  Dr. Sharol Given consulted for input and recommended IV antibiotics for another 24 hours followed by amputation.  Blood cultures positive for strep intermedius and wound cultures showed Klebsiella pneumonia.  ID recommended high dose Rocephin x4 weeks.   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 Sharol Given reported that patient had abscesses that extended through anterior and lateral compartment at level of amputation. Limb guard and stump shrinker ordered by [employer].  Fever resolving with repeat blood cultures negative so far.  Plans for PICC line placement on 02/07/2019. Hospital course further complicated by ABLA, with drop in hemoglobin on 10/03 to 6.6 and was transfused with I unit PRBC.  Pain control improving and therapy evaluations completed revealing deficits in mobility as well as ability to carry out ADLs.  CIR recommended due to functional decline.   Review of Systems  Constitutional: Negative for chills and fever.  HENT: Negative for hearing loss.   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  Respiratory: Negative for cough, hemoptysis and sputum production.   Cardiovascular: Negative for chest pain and palpitations.  Gastrointestinal: Negative for abdominal pain, constipation, heartburn, nausea and vomiting.  Genitourinary: Negative for dysuria and urgency.  Musculoskeletal: Positive for myalgias. Negative for back pain and joint pain.  Skin: Negative for itching and rash.  Neurological: Negative for dizziness, sensory change, speech change and headaches.  Psychiatric/Behavioral: Negative for depression (had difficult time with loss of mother this year/mood improving. ).     Past Medical History:  Diagnosis Date  . Diabetes mellitus    uncontrolled, last A1c was 14  . E. coli sepsis ([place])   . Ectopic pregnancy   . Essential hypertension, benign 11/01/2008  . Fatty liver   . Gastroparesis   . GERD (gastroesophageal reflux disease)   . Helicobacter pylori gastritis 09/07/2011  . Migraine   . Morbid obesity with body mass index (BMI) of 40.0 to 49.9 ([place])   . Pulmonary edema   . Pyelonephritis   . Ureteral obstruction     Past Surgical History:  Procedure Laterality Date  . AMPUTATION Right 02/04/2019   Procedure: RIGHT BELOW KNEE AMPUTATION;  Surgeon: Newt Minion, MD;  Location: [facility];  Service: Orthopedics;  Laterality: Right;  . ESOPHAGOGASTRODUODENOSCOPY  2010  . WOUND DEBRIDEMENT Right 01/03/2019   Procedure: DEBRIDEMENT WOUND RIGHT LATERAL FOOT;  Surgeon: Angelia Mould, MD;  Location: [facility] OR;  Service: Vascular;  Laterality: Right;    Family History  Problem Relation Age of Onset  . Arthritis Mother   . Asthma Mother   . COPD Mother   .  Depression Mother   . Diabetes Mother   . Hypertension Mother   . Mental illness Mother   . Kidney disease Mother   . Crohn's disease Mother   . Aneurysm Father 65       brain  . Mental illness Sister   . Arthritis Maternal Aunt   . Asthma Maternal Aunt   . COPD Maternal Aunt   . Depression  Maternal Aunt   . Diabetes Maternal Aunt   . Hypertension Maternal Aunt   . Mental illness Maternal Aunt   . Stroke Maternal Aunt   . Heart failure Maternal Aunt   . Heart failure Maternal Grandfather   . Cancer Maternal Grandfather   . Diabetes Maternal Grandfather   . Heart disease Maternal Grandfather   . Hypertension Maternal Grandfather   . Hyperlipidemia Maternal Grandfather   . Mental illness Brother   . Diabetes Maternal Grandmother   . Heart disease Maternal Grandmother   . Hypertension Maternal Grandmother   . Hyperlipidemia Maternal Grandmother   . Diabetes Paternal Grandmother   . Heart disease Paternal Grandmother   . Diabetes Paternal Grandfather   . Cancer Paternal Grandfather     Social History:  Lives with fiancee. Works as Software engineer for [employer]. She  reports that she has never smoked. She has never used smokeless tobacco. She reports previous alcohol use. She reports that she does not use drugs.    Allergies  Allergen Reactions  . Bee Venom Anaphylaxis, Hives, Shortness Of Breath and Swelling  . Penicillins Itching    Has patient had a PCN reaction causing immediate rash, facial/tongue/throat swelling, SOB or lightheadedness with hypotension: yes Has patient had a PCN reaction causing severe rash involving mucus membranes or skin necrosis: unknown Has patient had a PCN reaction that required hospitalization : yes Has patient had a PCN reaction occurring within the last 10 years: no If all of the above answers are "NO", then may proceed with Cephalosporin use.    Medications Prior to Admission  Medication Sig Dispense Refill  . blood glucose meter kit and supplies Dispense based on patient and insurance preference. Use up to four times daily as directed. (FOR ICD-10 E10.9, E11.9). (Patient not taking: Reported on 02/01/2019) 1 each 0  . cephALEXin (KEFLEX) 500 MG capsule Take 1 capsule (500 mg total) by mouth 3 (three) times daily. (Patient not  taking: Reported on 02/01/2019) 21 capsule 1  . escitalopram (LEXAPRO) 10 MG tablet Take 1 tablet (10 mg total) by mouth daily. (Patient not taking: Reported on 01/28/2019) 90 tablet 0  . insulin lispro (HUMALOG KWIKPEN) 100 UNIT/ML KwikPen Inject 0.08 mLs (8 Units total) into the skin 3 (three) times daily before meals. (Patient not taking: Reported on 02/01/2019) 15 mL 0  . Insulin Pen Needle 31G X 6 MM MISC 1 pen by Does not apply route as directed. (Patient not taking: Reported on 02/01/2019) 100 each 0  . sulfamethoxazole-trimethoprim (BACTRIM) 400-80 MG tablet Take 1 tablet by mouth 2 (two) times daily. (Patient not taking: Reported on 02/01/2019) 28 tablet 0  . [DISCONTINUED] Insulin Glargine (LANTUS SOLOSTAR) 100 UNIT/ML Solostar Pen Inject 40 Units into the skin daily. (Patient not taking: Reported on 02/01/2019) 15 mL 0    Drug Regimen Review  Drug regimen was reviewed and remains appropriate with no significant issues identified  Home: Home Living Family/patient expects to be discharged to:: Private residence Living Arrangements: Spouse/significant other Available Help at Discharge: Family Type of Home: Mobile home Home Access:  Stairs to enter [employer] of Steps: 2; pt states she can get a ramp built if needed Home Layout: One level Bathroom Shower/Tub: Chiropodist: Standard Home Equipment: Cane - single point, Crutches, Industrial/product designer History: Prior Function Level of Independence: Independent with assistive device(s) Comments: pt reports using cane and single crutch interchangeably as needed for ambulation PTA, 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 Pt with 46 Y/o daughter, fiance works and family are pt's neighbors  Functional Status:  Mobility: Bed Mobility Overal bed mobility: Needs Assistance Bed Mobility: Supine to Sit Supine to sit: Supervision Sit to supine: Min assist General bed mobility comments:  supervision for safety Transfers Overall transfer level: Needs assistance Equipment used: Rolling walker (2 wheeled) Transfers: Sit to/from Stand Sit to Stand: Min guard Stand pivot transfers: Min guard General transfer comment: Stand step turn transfer performed with RW and min guard assist Ambulation/Gait Ambulation/Gait assistance: Min guard Gait Distance (Feet): 10 Feet(2 trials of 10') Assistive device: Rolling walker (2 wheeled) Gait Pattern/deviations: (hop to gait) General Gait Details: hop to gait with increased step length during 2nd gait trial Gait velocity: reduced Gait velocity interpretation: <1.31 ft/sec, indicative of household Engineer, mining mobility: Yes Wheelchair propulsion: Both upper extremities Wheelchair parts: Supervision/cueing Distance: 200 Wheelchair Assistance Details (indicate cue type and reason): PT providing verbal cues for safety and efficiency during turns  ADL: ADL Overall ADL's : Needs assistance/impaired Eating/Feeding: Independent, Sitting Grooming: Wash/dry hands, Wash/dry face, Set up, Sitting Upper Body Bathing: Set up, Sitting Lower Body Bathing: Moderate assistance, Sitting/lateral leans Upper Body Dressing : Set up, Sitting Lower Body Dressing: Maximal assistance, Moderate assistance, Sitting/lateral leans Toilet Transfer: Minimal assistance, Min guard, RW, Stand-pivot, BSC, Cueing for safety, Cueing for sequencing Toileting- Clothing Manipulation and Hygiene: Sit to/from stand, Moderate assistance Functional mobility during ADLs: Minimal assistance, Min guard, Rolling walker, Cueing for safety, Cueing for sequencing  Cognition: Cognition Overall Cognitive Status: Within Functional Limits for tasks assessed Orientation Level: Oriented X4, Oriented to person, Oriented to place, Oriented to time, Oriented to situation Cognition Arousal/Alertness: Awake/alert Behavior During Therapy: Anxious Overall  Cognitive Status: Within Functional Limits for tasks assessed General Comments: pt emotional and tearful about amputation, pt stating she thought she had more time before R limb would be removed.    Blood pressure (!) 146/79, pulse (!) 114, temperature 98.3 F (36.8 C), temperature source Oral, resp. rate 17, height 5' 2"  (1.575 m), weight 112.5 kg, last menstrual period 01/01/2019, SpO2 99 %. Physical Exam  Nursing note and vitals reviewed. Constitutional: She is oriented to person, place, and time. She appears well-developed.  Morbidly obese  HENT:  Head: Normocephalic and atraumatic.  Eyes: EOM are normal. Right eye exhibits no discharge. Left eye exhibits no discharge.  Neck: No tracheal deviation present. No thyromegaly present.  Respiratory: Effort normal. No respiratory distress.  GI: She exhibits no distension.  Musculoskeletal:     Comments: R-BKA edematous with wound VAC  LLE edema  Neurological: She is alert and oriented to person, place, and time.  Motor: B/l UE, LLE, Right hip 5/5 proximal to distal Sensation intact to light touch  Skin: Skin is warm and dry.  +VAC  Psychiatric: She has a normal mood and affect. Her behavior is normal.    Results for orders placed or performed during the hospital encounter of 01/31/19 (from the past 48 hour(s))  Type and screen [place]     Status: None  Collection Time: 02/07/19 10:28 AM  Result Value Ref Range   ABO/RH(D) A POS    Antibody Screen NEG    Sample Expiration 02/10/2019,2359    Unit Number [id number]    Blood Component Type RED CELLS,LR    Unit division 00    Status of Unit ISSUED,FINAL    Transfusion Status OK TO TRANSFUSE    Crossmatch Result      Compatible Performed at [facility], [place] [address], [place], [place] [address]   Prepare RBC     Status: None   Collection Time: 02/07/19 10:34 AM  Result Value Ref Range   Order Confirmation      ORDER PROCESSED BY BLOOD BANK  Performed at [facility], [place] [address], [place], [place] [address]   Glucose, capillary     Status: Abnormal   Collection Time: 02/07/19 12:41 PM  Result Value Ref Range   Glucose-Capillary 164 (H) 70 - 99 mg/dL  Glucose, capillary     Status: Abnormal   Collection Time: 02/07/19  4:42 PM  Result Value Ref Range   Glucose-Capillary 157 (H) 70 - 99 mg/dL  Glucose, capillary     Status: Abnormal   Collection Time: 02/07/19  9:29 PM  Result Value Ref Range   Glucose-Capillary 167 (H) 70 - 99 mg/dL   Comment 1 Document in Chart   Glucose, capillary     Status: Abnormal   Collection Time: 02/08/19  6:31 AM  Result Value Ref Range   Glucose-Capillary 206 (H) 70 - 99 mg/dL   Comment 1 Document in Chart   Hemoglobin and hematocrit, blood     Status: Abnormal   Collection Time: 02/08/19 11:04 AM  Result Value Ref Range   Hemoglobin 7.7 (L) 12.0 - 15.0 g/dL   HCT 24.7 (L) 36.0 - 46.0 %    Comment: Performed at [facility], [address] [place], [place] [address]  Glucose, capillary     Status: Abnormal   Collection Time: 02/08/19 11:48 AM  Result Value Ref Range   Glucose-Capillary 174 (H) 70 - 99 mg/dL  Glucose, capillary     Status: Abnormal   Collection Time: 02/08/19  4:25 PM  Result Value Ref Range   Glucose-Capillary 193 (H) 70 - 99 mg/dL  Glucose, capillary     Status: Abnormal   Collection Time: 02/08/19  9:15 PM  Result Value Ref Range   Glucose-Capillary 142 (H) 70 - 99 mg/dL   Comment 1 Document in Chart   Glucose, capillary     Status: Abnormal   Collection Time: 02/09/19  6:36 AM  Result Value Ref Range   Glucose-Capillary 246 (H) 70 - 99 mg/dL   Comment 1 Document in Chart    No results found.     Medical Problem List and Plan: 1.  Deficits with mobility and self-care secondary to right BKA.  Admit to CIR. 2.  Antithrombotics: -DVT/anticoagulation:  Pharmaceutical: Lovenox  -antiplatelet therapy: N/A 3. Pain Management: Oxycodone as  needed.  We will add low-dose gabapentin to augment pain management/phantom symptoms. D/c dilaudid.   Monitor pain with increased activity. 4. Mood: LCSW to follow for evaluation and support  -antipsychotic agents: N/A 5. Neuropsych: This patient is capable of making decisions on on her own behalf. 6. Skin/Wound Care: Wound VAC changed to [place] with ?plans to d/c after 7 days. Continue Protostat as well as protein supplements to help promote healing 7. Fluids/Electrolytes/Nutrition: Monitor I/O.  CMP ordered  for tomorrow.  8.  Sepsis with bacteremia: Has been afebrile.  Continue ceftriaxone 2 g every 24 for 4 total weeks end date 03/04/19 9. T2DM: Hgb A1C- 10.8.  Has not been on medications for years and now aware of importance of compliance.  Will monitor blood sugars AC/at bedtime.  Continue Lantus insulin and titrate upwards as indicated.   Monitor with increased mobility.  10. Nausea/vomiting: Has resolved with treatment of sepsis.  Continue PPI.  Denies issues with gastroparesis.  Will change Reglan to p.o. route and wean off as able. 11.  Mild tachycardia: We will monitor for now.  Blood pressures reasonable.  Monitor HR with increased exertion.  12.  Leukocytosis: Improving.  Continue to monitor for fevers or other signs of infection. 13.  Anemia: Iron deficiency with low folate levels and elevated ferritin.  Add folic acid.  Likely needs IV iron for supplementation  CBC ordered for tomorrow 14.  Hyponatremia: We will continue to monitor--should improve as N/V resolved and intake improving.  15.  Morbid obesity with malnutrition:  Protein supplements to promote wound healing.  Educated patient on importance of appropriate diet and weight loss to help promote health and activity.  Will consult dietitian for diet education.   Bary Leriche, PA-C 02/09/2019   I have personally performed a face to face diagnostic evaluation, including, but not limited to relevant history and physical exam  findings, of this patient and developed relevant assessment and plan.  Additionally, I have reviewed and concur with the physician assistant's documentation above.  Delice Lesch, MD, ABPMR

## 2019-02-06 NOTE — TOC Initial Note (Signed)
Transition of Care [facility]) - Initial/Assessment Note    Patient Details  Name: Sheila Austin MRN: [id number] Date of Birth: [birth year]  Transition of Care [facility]) CM/SW Contact:    Benard Halsted, [place] Phone Number: 02/06/2019, 9:09 AM  Clinical Narrative:                 CSW received consult regarding SNF placement. CSW notes patient's preference is to go to CIR. CSW will follow along for progress.   Expected Discharge Plan: IP Rehab Facility Barriers to Discharge: Continued Medical Work up   Patient Goals and CMS Choice Patient states their goals for this hospitalization and ongoing recovery are:: Rehab to get stronger CMS Medicare.gov Compare Post Acute Care list provided to:: Patient Choice offered to / list presented to : Patient  Expected Discharge Plan and Services Expected Discharge Plan: [place] In-house Referral: Clinical Social Work Discharge Planning Services: NA Post Acute Care Choice: IP Rehab Living arrangements for the past 2 months: Single Family Home                 DME Arranged: N/A DME Agency: NA       HH Arranged: NA [place] Agency: NA        Prior Living Arrangements/Services Living arrangements for the past 2 months: [place] Lives with:: Self Patient language and need for interpreter reviewed:: Yes Do you feel safe going back to the place where you live?: Yes      Need for Family Participation in Patient Care: No (Comment) Care giver support system in place?: Yes (comment)   Criminal Activity/Legal Involvement Pertinent to Current Situation/Hospitalization: No - Comment as needed  Activities of Daily Living Home Assistive Devices/Equipment: Cane (specify quad or straight), Crutches ADL Screening (condition at time of admission) Patient's cognitive ability adequate to safely complete daily activities?: Yes Is the patient deaf or have difficulty hearing?: No Does the patient have difficulty seeing, even when wearing  glasses/contacts?: No Does the patient have difficulty concentrating, remembering, or making decisions?: No Patient able to express need for assistance with ADLs?: Yes Does the patient have difficulty dressing or bathing?: No Independently performs ADLs?: Yes (appropriate for developmental age) Does the patient have difficulty walking or climbing stairs?: No Weakness of Legs: None Weakness of Arms/Hands: None  Permission Sought/Granted                  Emotional Assessment Appearance:: Appears stated age     Orientation: : Oriented to Self, Oriented to Place, Oriented to  Time, Oriented to Situation Alcohol / Substance Use: Not Applicable Psych Involvement: No (comment)  Admission diagnosis:  Pain [R52] Hyperglycemia [R73.9] Skin ulcer of right foot with necrosis of muscle (HCC) [L97.513] Intractable vomiting with nausea, unspecified vomiting type [R11.2] Sepsis with acute organ dysfunction without septic shock, due to unspecified organism, unspecified type ([place]) [A41.9, R65.20] Diabetic foot infection ([place]) [id number], L08.9] Patient Active Problem List   Diagnosis Date Noted  . Sepsis with acute organ dysfunction without septic shock ([place])   . 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[place])   . Streptococcal bacteremia   . Klebsiella pneumoniae infection   . Abscess of leg, right   . Skin ulcer of right foot with necrosis of muscle ([place])   . Severe protein-calorie malnutrition ([place])   . Diabetic foot ulcer with osteomyelitis ([place]) 02/01/2019  . Adjustment disorder 02/01/2019  . Sepsis due to cellulitis ([place]) 02/01/2019  . Obesity 12/31/2018  . Facial cellulitis  07/02/2018  . Class 3 severe obesity with body mass index (BMI) of 40.0 to 44.9 in adult ([place]) 07/09/2017  . Abscess of right breast 02/24/2016  . BMI 45.0-49.9, adult ([place]) 09/29/2015  . Skin lesions - right lower extremity 12/07/2013  . Morbid obesity with BMI of 50.0-59.9, adult ([place]) 10/05/2011  . Obstructive sleep  apnea 10/05/2011  . Hypertriglyceridemia 09/27/2011  . Migraine 09/07/2011  . Essential hypertension, benign 11/01/2008  . Gastroparesis 09/27/2008  . IRREGULAR MENSES 08/24/2008  . Diabetes mellitus out of control ([place]) 07/04/2006   PCP:  Maximiano Coss, NP Pharmacy:   [place], [place] - [address] [facility]. [address] [place] [address] Phone: [phone] Fax: [phone]  [place]), [place] - [address] [place]) [place] [address] Phone: [phone] Fax: [place], [place] - [address] [place] [address] Phone: [phone] Fax: [phone]     Social Determinants of Health (SDOH) Interventions    Readmission Risk Interventions No flowsheet data found.

## 2019-02-06 NOTE — Progress Notes (Addendum)
Inpatient Rehabilitation-Admissions Coordinator   I have insurance approval for admit to CIR for this patient. The patient would like to pursue CIR at this time. I have confirmed DC support. RN, CM/SW updated on plan for admit Saturday.  I have approval from Dr. Louanne Belton for admission to [facility] on 02/07/2019 (saturday). Dr. Dagoberto Ligas will see patient S[place]day morning to confirm patient readiness. Pt's RN can call [facility] nursing station ([phone]) by noon to get report.  Please call if questions.   Jhonnie Garner, OTR/L  Rehab Admissions Coordinator  [phone] 02/06/2019 5:37 PM

## 2019-02-06 NOTE — Progress Notes (Addendum)
INFECTIOUS DISEASE ATTENDING ADDENDUM:   This patient has been seen and discussed with the NP, Janene Madeira. Please see the NP's note for complete details. I concur with his findings and agree with the plan as outlined in her note with the following additions/corrections:  We will give her an aggressive course of antibiotics targetting the S intermedius, the klebsiella (also being covered) and we are going to ingore the diphteroid which is a contaminant of the wound culture  We will sign off for now.          [place] [facility] Infectious Disease  Date of Admission:  01/31/2019      Total days of antibiotics 5  Day 2 Ceftriaxone            ASSESSMENT: Sheila Austin is a 33 y.o. 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 She was bacteremic at presentation with streptococcus intermedius.   WBC improving. She had one fever yesterday 10/01 to 100.9 F but nothing further. Her RLE pain has improved significantly today.   Would hold off until tomorrow AM for PICC to ensure blood cultures are clear @ 48 hours. 4 weeks IV therapy to be given at home.   Her superficial wound culture prior to surgery grew rare diphtheroids - likely this is not a significant finding and more reflective of skin colonizer.  Will monitor condition on current therapy.   She had some itching on the arms without any rash in reaction to a dose of amoxicillin. No facial swelling/itching or airway changes. She was given benadryl shortly after to intervene. Will change her allergy to itching. This should not prohibit her from use of cephalosporins and likely penicillins also in the future if she has another serious infection.    PLAN: 1. Continue Ceftriaxone - OPAT outlined below 2. OK to place PICC line Saturday 10/03 if afebrile and blood cultures from  10/01 remain negative.  3. Will arrange for outpatient management with ID near the end of her therapy.  4. Change intolerance to amoxicillin to itching.   Will sign off for now but please call back with questions or should there be a change in the patient's condition.    OPAT ORDERS:  Diagnosis: Deep wound infection Right calf, bacteremia   Culture Result: streptococcus intermedius (superficial cultures with klebsiella pneumonia and diptheroids)  Allergies  Allergen Reactions  . Bee Venom Anaphylaxis, Hives, Shortness Of Breath and Swelling  . Penicillins Itching    Has patient had a PCN reaction causing immediate rash, facial/tongue/throat swelling, SOB or lightheadedness with hypotension: yes Has patient had a PCN reaction causing severe rash involving mucus membranes or skin necrosis: unknown Has patient had a PCN reaction that required hospitalization : yes Has patient had a PCN reaction occurring within the last 10 years: no If all of the above answers are "NO", then may proceed with Cephalosporin use.     Discharge antibiotics: Ceftriaxone 2 gm IV Q24h    Duration: 4 weeks   End Date: 03/04/19  [facility] Care and Maintenance Per Protocol _x_ Please pull PIC at completion of IV antibiotics __ Please leave PIC in place until doctor has seen patient or been notified  Labs weekly while on IV antibiotics: _x_ CBC with differential __ BMP __ BMP TWICE WEEKLY** _x_ CMP __ CRP __ ESR __ Vancomycin trough  Fax weekly labs to (  [phone]  Clinic Follow Up Appt: Dr. 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 docusate sodium  100 mg Oral BID  . feeding supplement (GLUCERNA SHAKE)  237 mL Oral Q1500  . feeding supplement (PRO-STAT SUGAR FREE 64)  30 mL Oral BID  . heparin  5,000 Units Subcutaneous Q8H  . insulin aspart  0-20 Units Subcutaneous TID WC  . insulin aspart  0-5 Units Subcutaneous QHS  . insulin glargine  25 Units Subcutaneous Daily  . metoCLOPramide (REGLAN) injection  5 mg Intravenous Q6H  . pantoprazole  40 mg Oral Daily  . sodium chloride flush  3 mL Intravenous Q12H    SUBJECTIVE: Pain under much better control today. Feeling better and smiling sitting up in bed.   Had some itching of the upper arms yesterday afternoon in response to the amoxicillin challenge. No progression or evidence of airway compromise or otherwise to indicate any severe anaphylaxis reaction.   Review of Systems: Review of Systems  Constitutional: Negative for chills, fever and malaise/fatigue.  HENT: Negative for tinnitus.   Eyes: Negative for blurred vision and photophobia.  Respiratory: Negative for cough and sputum production.   Cardiovascular: Negative for chest pain.  Gastrointestinal: Negative for diarrhea, nausea and vomiting.  Genitourinary: Negative for dysuria.  Musculoskeletal: Positive for joint pain (RLE, improved).  Skin: Negative for rash.  Neurological: Negative for headaches.    Allergies  Allergen Reactions  . Bee Venom Anaphylaxis, Hives, Shortness Of Breath and Swelling  . Penicillins Itching    Has patient had a PCN reaction causing immediate rash, facial/tongue/throat swelling, SOB or lightheadedness with hypotension: yes Has patient had a PCN reaction causing severe rash involving mucus membranes or skin necrosis: unknown Has patient had a PCN reaction that required hospitalization : yes  Has patient had a PCN reaction occurring within the last 10 years: no If all of the above answers are "NO", then may proceed with Cephalosporin use.     OBJECTIVE: Vitals:   02/05/19 2110 02/06/19 0338 02/06/19 0918 02/06/19 1330  BP: 138/82 132/80 (!) 142/83 125/81  Pulse: (!) 109 (!) 106 (!) 104 (!) 104  Resp: 15 20 16 15   Temp: 99.2 F (37.3 C) 99.6 F (37.6 C) 99.2 F (37.3 C) 99.4 F (37.4 C)  TempSrc: Oral Oral Oral Oral  SpO2: 93% 95% 96% 94%  Weight:      Height:       Body mass index is 45.36 kg/m.  Physical Exam HENT:     Mouth/Throat:     Mouth: No oral lesions.     Dentition: No dental abscesses.  Cardiovascular:     Rate and Rhythm: Normal rate and regular rhythm.     Heart sounds: Normal heart sounds.  Pulmonary:     Effort: Pulmonary effort is normal.     Breath sounds: Normal breath sounds.  Abdominal:     General:  There is no distension.     Palpations: Abdomen is soft.     Tenderness: There is no abdominal tenderness.  Musculoskeletal: Normal range of motion.        General: No tenderness.     Comments: RLE wound vac engaged and little to no drainage in tubing. Dressing material clean and dry   Lymphadenopathy:     Cervical: No cervical adenopathy.  Skin:    General: Skin is warm and dry.     Findings: No rash.  Neurological:     Mental Status: She is alert and oriented to person, place, and time.  Psychiatric:        Judgment: Judgment normal.     Lab Results Lab Results  Component Value Date   WBC 16.5 (H) 02/06/2019   HGB 7.0 (L) 02/06/2019   HCT 22.9 (L) 02/06/2019   MCV 86.4 02/06/2019   PLT 475 (H) 02/06/2019    Lab Results  Component Value Date   CREATININE 1.05 (H) 02/06/2019   BUN 18 02/06/2019   NA 133 (L) 02/06/2019   K 3.6 02/06/2019   CL 101 02/06/2019   CO2 23 02/06/2019    Lab Results  Component Value Date   ALT 12 02/01/2019   AST 15 02/01/2019   ALKPHOS 215 (H) 02/01/2019   BILITOT 1.2 02/01/2019      Microbiology: Recent Results (from the past 240 hour(s))  Blood culture (routine x 2)     Status: None   Collection Time: 02/01/19 12:19 AM   Specimen: BLOOD  Result Value Ref Range Status   Specimen Description BLOOD LEFT HAND  Final   Special Requests   Final    BOTTLES DRAWN AEROBIC ONLY Blood Culture adequate volume   Culture   Final    NO GROWTH 5 DAYS Performed at [facility], [address], [place], [place] [address]    Report Status 02/06/2019 FINAL  Final  Urine culture     Status: Abnormal   Collection Time: 02/01/19 12:25 AM   Specimen: Urine, Random  Result Value Ref Range Status   Specimen Description URINE, RANDOM  Final   Special Requests   Final    NONE Performed at [facility], [place] [address], [place], [place] [address]    Culture MULTIPLE SPECIES PRESENT, SUGGEST RECOLLECTION (A)  Final   Report Status 02/02/2019 FINAL  Final  Blood culture (routine x 2)     Status: Abnormal   Collection Time: 02/01/19 12:34 AM   Specimen: BLOOD  Result Value Ref Range Status   Specimen Description BLOOD RIGHT ARM  Final   Special Requests   Final    BOTTLES DRAWN AEROBIC AND ANAEROBIC Blood Culture results may not be optimal due to an excessive volume of blood received in culture bottles   Culture  Setup Time   Final    GRAM POSITIVE COCCI ANAEROBIC BOTTLE ONLY CRITICAL RESULT CALLED TO, READ BACK BY AND VERIFIED WITHBronwen Betters [facility] 2154 02/03/19 A BROWNING Performed at [facility], [place] [address], [place],  [address]    Culture STREPTOCOCCUS INTERMEDIUS (A)  Final   Report Status 02/05/2019 FINAL  Final   Organism ID, Bacteria STREPTOCOCCUS INTERMEDIUS  Final      Susceptibility   Streptococcus intermedius - MIC*    PENICILLIN <=0.06 SENSITIVE Sensitive     CEFTRIAXONE 0.25 SENSITIVE Sensitive     ERYTHROMYCIN >=8 RESISTANT Resistant     LEVOFLOXACIN 0.5 SENSITIVE  Sensitive     VANCOMYCIN 0.5 SENSITIVE Sensitive     * STREPTOCOCCUS  INTERMEDIUS  Blood Culture ID Panel (Reflexed)     Status: Abnormal   Collection Time: 02/01/19 12:34 AM  Result Value Ref Range Status   Enterococcus species NOT DETECTED NOT DETECTED Final   Listeria monocytogenes NOT DETECTED NOT DETECTED Final   Staphylococcus species NOT DETECTED NOT DETECTED Final   Staphylococcus aureus (BCID) NOT DETECTED NOT DETECTED Final   Streptococcus species DETECTED (A) NOT DETECTED Final    Comment: Not Enterococcus species, Streptococcus agalactiae, Streptococcus pyogenes, or Streptococcus pneumoniae. CRITICAL RESULT CALLED TO, READ BACK BY AND VERIFIED WITH: L CURRAN PHARMD 2154 02/03/19 A BROWNING    Streptococcus agalactiae NOT DETECTED NOT DETECTED Final   Streptococcus pneumoniae NOT DETECTED NOT DETECTED Final   Streptococcus pyogenes NOT DETECTED NOT DETECTED Final   Acinetobacter baumannii NOT DETECTED NOT DETECTED Final   Enterobacteriaceae species NOT DETECTED NOT DETECTED Final   Enterobacter cloacae complex NOT DETECTED NOT DETECTED Final   Escherichia coli NOT DETECTED NOT DETECTED Final   Klebsiella oxytoca NOT DETECTED NOT DETECTED Final   Klebsiella pneumoniae NOT DETECTED NOT DETECTED Final   Proteus species NOT DETECTED NOT DETECTED Final   Serratia marcescens NOT DETECTED NOT DETECTED Final   Haemophilus influenzae NOT DETECTED NOT DETECTED Final   Neisseria meningitidis NOT DETECTED NOT DETECTED Final   Pseudomonas aeruginosa NOT DETECTED NOT DETECTED Final   Candida albicans NOT DETECTED NOT DETECTED Final   Candida glabrata NOT DETECTED NOT DETECTED Final   Candida krusei NOT DETECTED NOT DETECTED Final   Candida parapsilosis NOT DETECTED NOT DETECTED Final   Candida tropicalis NOT DETECTED NOT DETECTED Final    Comment: Performed at [facility], [place] [address] [place], [place] [address]  SARS Coronavirus 2 [facility] order, Performed in [facility] hospital lab) Nasopharyngeal Nasopharyngeal Swab     Status: None    Collection Time: 02/01/19  3:50 AM   Specimen: Nasopharyngeal Swab  Result Value Ref Range Status   SARS Coronavirus 2 NEGATIVE NEGATIVE Final    Comment: (NOTE) If result is NEGATIVE SARS-CoV-2 target nucleic acids are NOT DETECTED. The SARS-CoV-2 RNA is generally detectable in upper and lower  respiratory specimens during the acute phase of infection. The lowest  concentration of SARS-CoV-2 viral copies this assay can detect is 250  copies / mL. A negative result does not preclude SARS-CoV-2 infection  and should not be used as the sole basis for treatment or other  patient management decisions.  A negative result may occur with  improper specimen collection / handling, submission of specimen other  than nasopharyngeal swab, presence of viral mutation(s) within the  areas targeted by this assay, and inadequate number of viral copies  (<250 copies / mL). A negative result must be combined with clinical  observations, patient history, and epidemiological information. If result is POSITIVE SARS-CoV-2 target nucleic acids are DETECTED. The SARS-CoV-2 RNA is generally detectable in upper and lower  respiratory specimens dur ing the acute phase of infection.  Positive  results are indicative of active infection with SARS-CoV-2.  Clinical  correlation with patient history and other diagnostic information is  necessary to determine patient infection status.  Positive results do  not rule out bacterial infection or co-infection with other viruses. If result is PRESUMPTIVE POSTIVE SARS-CoV-2 nucleic acids MAY BE PRESENT.   A presumptive positive result was obtained on the submitted specimen  and confirmed on repeat testing.  While 2019 novel coronavirus  (SARS-CoV-2) nucleic acids may be present in the submitted sample  additional confirmatory testing may be necessary for epidemiological  and / or clinical management purposes  to differentiate between  SARS-CoV-2 and other Sarbecovirus  currently known to infect humans.  If clinically indicated additional testing with an alternate test  methodology [phone]) is advised. The SARS-CoV-2 RNA is generally  detectable in upper and lower respiratory sp ecimens during the acute  phase of infection. The expected result is Negative. Fact Sheet for Patients:  StrictlyIdeas.no Fact Sheet for Healthcare Providers: BankingDealers.co.za This test is not yet approved or cleared by the [place] FDA and has been authorized for detection and/or diagnosis of SARS-CoV-2 by FDA under an Emergency Use Authorization (EUA).  This EUA will remain in effect (meaning this test can be used) for the duration of the COVID-19 declaration under Section 564(b)(1) of the Act, 21 U.S.C. section 360bbb-3(b)(1), unless the authorization is terminated or revoked sooner. Performed at [facility], [place] [address], [place], [place] [address]   Aerobic Culture (superficial specimen)     Status: None   Collection Time: 02/01/19 11:55 AM   Specimen: Wound  Result Value Ref Range Status   Specimen Description WOUND RIGHT FOOT  Final   Special Requests NONE  Final   Gram Stain   Final    RARE WBC PRESENT, PREDOMINANTLY PMN ABUNDANT GRAM POSITIVE COCCI ABUNDANT GRAM NEGATIVE RODS    Culture   Final    RARE KLEBSIELLA PNEUMONIAE RARE DIPHTHEROIDS(CORYNEBACTERIUM SPECIES) Standardized susceptibility testing for this organism is not available. WITH IN MIXED ORGANISMS Performed at [facility], [place] [address], [place], [place] [address]    Report Status 02/05/2019 FINAL  Final   Organism ID, Bacteria KLEBSIELLA PNEUMONIAE  Final      Susceptibility   Klebsiella pneumoniae - MIC*    AMPICILLIN >=32 RESISTANT Resistant     CEFAZOLIN >=64 RESISTANT Resistant     CEFEPIME <=1 SENSITIVE Sensitive     CEFTAZIDIME <=1 SENSITIVE Sensitive     CEFTRIAXONE <=1 SENSITIVE Sensitive     CIPROFLOXACIN  <=0.25 SENSITIVE Sensitive     GENTAMICIN 4 SENSITIVE Sensitive     IMIPENEM 2 SENSITIVE Sensitive     TRIMETH/SULFA <=20 SENSITIVE Sensitive     AMPICILLIN/SULBACTAM 16 INTERMEDIATE Intermediate     PIP/TAZO <=4 SENSITIVE Sensitive     Extended ESBL NEGATIVE Sensitive     * RARE KLEBSIELLA PNEUMONIAE  Surgical pcr screen     Status: None   Collection Time: 02/04/19  2:11 AM   Specimen: Nasal Mucosa; Nasal Swab  Result Value Ref Range Status   MRSA, PCR NEGATIVE NEGATIVE Final   Staphylococcus aureus NEGATIVE NEGATIVE Final    Comment: (NOTE) The Xpert SA Assay (FDA approved for NASAL specimens in patients 22 years of age and older), is one component of a comprehensive surveillance program. It is not intended to diagnose infection nor to guide or monitor treatment. Performed at [facility], [place] [address], [place], [place] [address]   Aerobic/Anaerobic Culture (surgical/deep wound)     Status: None (Preliminary result)   Collection Time: 02/04/19 11:16 AM   Specimen: Soft Tissue, Other  Result Value Ref Range Status   Specimen Description TISSUE  Final   Special Requests NONE  Final   Gram Stain   Final    RARE WBC PRESENT, PREDOMINANTLY PMN NO ORGANISMS SEEN Performed at [facility], [place]  [address], [place], [place] [address]    Culture   Final    CULTURE REINCUBATED FOR BETTER GROWTH NO ANAEROBES ISOLATED; CULTURE IN PROGRESS FOR 5 DAYS    Report Status PENDING  Incomplete  Culture, blood (routine x 2)     Status: None (Preliminary result)   Collection Time: 02/05/19 11:41 AM   Specimen: BLOOD  Result Value Ref Range Status   Specimen Description BLOOD RIGHT ANTECUBITAL  Final   Special Requests   Final    BOTTLES DRAWN AEROBIC AND ANAEROBIC Blood Culture results may not be optimal due to an excessive volume of blood received in culture bottles   Culture   Final    NO GROWTH < 24 HOURS Performed at [facility], [place] [address], [place], [place]  [address]    Report Status PENDING  Incomplete  Culture, blood (routine x 2)     Status: None (Preliminary result)   Collection Time: 02/05/19 11:51 AM   Specimen: BLOOD  Result Value Ref Range Status   Specimen Description BLOOD BLOOD LEFT HAND  Final   Special Requests   Final    BOTTLES DRAWN AEROBIC AND ANAEROBIC Blood Culture adequate volume   Culture   Final    NO GROWTH < 24 HOURS Performed at [facility], [place] [address], [place], [place] [address]    Report Status PENDING  Incomplete     Janene Madeira, MSN, NP-[facility] [email or web address] Pager: [phone] Office: [place]: [phone]

## 2019-02-06 NOTE — Progress Notes (Signed)
Inpatient Rehabilitation-Admissions Coordinator   Pt would like to pursue IP Rehab if she can get approved. Feel pt remains a good candidate for CIR. Will begin insurance authorization process for possible admit.   Please call if questions.   Jhonnie Garner, OTR/L  Rehab Admissions Coordinator  [phone] 02/06/2019 11:40 AM

## 2019-02-06 NOTE — Plan of Care (Signed)
  Problem: Pain Managment: Goal: General experience of comfort will improve Outcome: Progressing   

## 2019-02-06 NOTE — PMR Pre-admission (Signed)
PMR Admission Coordinator Pre-Admission Assessment  Patient: Sheila Austin is an 33 y.o., female MRN: [id number] DOB: Oct 16, 1985 Height: 5' 2"  (157.5 cm) Weight: 112.5 kg  Insurance Information HMO:    PPO: yes     PCP:      IPA:      80/20:      OTHER:  PRIMARY: [place] [employer] plus network [employer]      Policy#:[id number]      Subscriber: Patient CM Name: Sheila Austin      Phone#: [phone]     Fax#: [phone] Pre-Cert#: [id number]      Employer:  Sheila Austin provided by Sheila Austin on 10/2 for admit to CIR. Pt was originally approved 10/02-10/09 but due to acute medical concerns her admit was pushed back and new dates were verbally given from [place]. Pt is approved for 7 days (admitted 02/09/2019) Updates are due 02/16/2019. Clinical updates due to (f): [phone]  Benefits:  Phone #: online - [email or web address]    Eff. Date: 02/04/2013 - still active     Deduct: $300 ($300 met)      Out of Pocket Max: $7,900 (includes deductible - $7, 900)      Life Max: NA CIR: 80% coverage, 20% co-insurance      SNF: 80% coverage/20% co-insurance or 60% coverage, 40% co-insurance pending [facility] facility or [facility] network provider; limited to 120 days/cal yr.  Outpatient: medical necessity review starts after 25 visits   Co-Pay: $20-40/visit co-pay pending provider type Home Health: 80% coverage; no visit limit, but each visit cannot exceed 4 hours      Co-Pay: 20% co-insurance DME: 80% coverage     Co-Pay: 20% co-insurance Providers:  SECONDARY: none       Policy#:       Subscriber:  CM Name:       Phone#:      Fax#:  Pre-Cert#:       Employer:  Benefits:  Phone #:      Name:  Eff. Date:      Deduct:      Out of Pocket Max:       Life Max:  CIR:       SNF:  Outpatient:     Co-Pay:  Home Health:      Co-Pay:  DME:      Co-Pay:   Medicaid Application Date:       Case Manager:  Disability Application Date:       Case Worker:   The "Data Collection Information Summary" for  patients in Inpatient Rehabilitation Facilities with attached "Privacy Act Long Records" was provided and verbally reviewed with: N/A  Emergency Contact Information Contact Information    Name Relation Home Work Mobile   Sheila Austin Significant other   [phone]      Current Medical History  Patient Admitting Diagnosis: Right BKA History of Present Illness: Sheila Austin is a 33 year old female with history of T2DM,  HTN, medical non-compliance for years, recent depressive episode due to loss of mother, morbid obesity, fatty liver who developed a blister this summer that ruptured with onset of left food ulcer treated with I&D on 12/31/18 who left AMA post procedure due to family issues. She continued to have poor wound healing despite conservative care was admitted to [facility] 01/31/19 with nausea and vomiting, severe foot pain with foul drainage, inability to walk and weakness. She was found to be septic due to cellulitis  and treated  with IVF as well broad spectrum antibiotics. Due to extent of her osteomyelitis and infection it was felt there was no way to salvage the foot. The pt underwent right transtibial amputation on 02/04/2019. Pt has been seen by infectious disease for recommendations on duration of antibiotic. On Saturday, 10/3, pt was noted to have a drop in hemoglobin and she was given 1 unit packed red blood cells. Hgb went up from 6.6 to 7.7. On Monday, 10/5 pt did have a controlled fall with PT during session with no reports of injury. Pt has been evaluated by therapies and CIR has been recommended. Pt is to admit to CIR on 02/07/2019.     Patient's medical record from [facility] has been reviewed by the rehabilitation admission coordinator and physician.  Past Medical History  Past Medical History:  Diagnosis Date  . Diabetes mellitus    uncontrolled, last A1c was 14  . E. coli sepsis ([place])   . Ectopic pregnancy   . Essential  hypertension, benign 11/01/2008  . Fatty liver   . Gastroparesis   . GERD (gastroesophageal reflux disease)   . Helicobacter pylori gastritis 09/07/2011  . Migraine   . Morbid obesity with body mass index (BMI) of 40.0 to 49.9 ([place])   . Pulmonary edema   . Pyelonephritis   . Ureteral obstruction     Family History   family history includes Aneurysm (age of onset: 26) in her father; Arthritis in her maternal aunt and mother; Asthma in her maternal aunt and mother; COPD in her maternal aunt and mother; Cancer in her maternal grandfather and paternal grandfather; Crohn's disease in her mother; Depression in her maternal aunt and mother; Diabetes in her maternal aunt, maternal grandfather, maternal grandmother, mother, paternal grandfather, and paternal grandmother; Heart disease in her maternal grandfather, maternal grandmother, and paternal grandmother; Heart failure in her maternal aunt and maternal grandfather; Hyperlipidemia in her maternal grandfather and maternal grandmother; Hypertension in her maternal aunt, maternal grandfather, maternal grandmother, and mother; Kidney disease in her mother; Mental illness in her brother, maternal aunt, mother, and sister; Stroke in her maternal aunt.  Prior Rehab/Hospitalizations Has the patient had prior rehab or hospitalizations prior to admission? Yes  Has the patient had major surgery during 100 days prior to admission? Yes   Current Medications  Current Facility-Administered Medications:  .  0.9 %  sodium chloride infusion, , Intravenous, Continuous, Newt Minion, MD, Last Rate: 10 mL/hr at 02/08/19 [phone] .  acetaminophen (TYLENOL) tablet 650 mg, 650 mg, Oral, Q6H PRN, 650 mg at 02/04/19 0413 **OR** acetaminophen (TYLENOL) suppository 650 mg, 650 mg, Rectal, Q6H PRN, Newt Minion, MD .  acetaminophen (TYLENOL) tablet 325-650 mg, 325-650 mg, Oral, Q6H PRN, Newt Minion, MD, 650 mg at 02/08/19 0919 .  bisacodyl (DULCOLAX) suppository 10 mg, 10  mg, Rectal, Daily PRN, Newt Minion, MD .  cefTRIAXone (ROCEPHIN) 2 g in sodium chloride 0.9 % 100 mL IVPB, 2 g, Intravenous, Q24H, Dixon, Melton Krebs, NP, Last Rate: 200 mL/hr at 02/08/19 1307, 2 g at 02/08/19 1307 .  docusate sodium (COLACE) capsule 100 mg, 100 mg, Oral, BID, Newt Minion, MD, 100 mg at 02/09/19 1003 .  EPINEPHrine (EPI-PEN) injection 0.3 mg, 0.3 mg, Intramuscular, Once PRN, Dixon, Melton Krebs, NP .  feeding supplement (GLUCERNA SHAKE) (GLUCERNA SHAKE) liquid 237 mL, 237 mL, Oral, Q1500, Newt Minion, MD, 237 mL at 02/07/19 2145 .  feeding supplement (PRO-STAT SUGAR FREE 64) liquid 30  mL, 30 mL, Oral, BID, Newt Minion, MD, 30 mL at 02/09/19 1003 .  heparin injection 5,000 Units, 5,000 Units, Subcutaneous, Q8H, Newt Minion, MD, 5,000 Units at 02/09/19 0542 .  HYDROmorphone (DILAUDID) injection 0.5-1 mg, 0.5-1 mg, Intravenous, Q4H PRN, Newt Minion, MD, 1 mg at 02/06/19 9242 .  insulin aspart (novoLOG) injection 0-20 Units, 0-20 Units, Subcutaneous, TID WC, Newt Minion, MD, 7 Units at 02/09/19 1003 .  insulin aspart (novoLOG) injection 0-5 Units, 0-5 Units, Subcutaneous, QHS, Newt Minion, MD, 2 Units at 02/01/19 2213 .  insulin glargine (LANTUS) injection 25 Units, 25 Units, Subcutaneous, Daily, Newt Minion, MD, 25 Units at 02/09/19 1004 .  magnesium citrate solution 1 Bottle, 1 Bottle, Oral, Once PRN, Newt Minion, MD .  magnesium sulfate IVPB 1 g 100 mL, 1 g, Intravenous, Once, Vann, Jessica U, DO .  methocarbamol (ROBAXIN) tablet 500 mg, 500 mg, Oral, Q6H PRN, 500 mg at 02/05/19 0547 **OR** methocarbamol (ROBAXIN) 500 mg in dextrose 5 % 50 mL IVPB, 500 mg, Intravenous, Q6H PRN, Newt Minion, MD .  metoCLOPramide (REGLAN) injection 5 mg, 5 mg, Intravenous, Q6H, Newt Minion, MD, 5 mg at 02/09/19 0542 .  metoCLOPramide (REGLAN) tablet 5-10 mg, 5-10 mg, Oral, Q8H PRN **OR** metoCLOPramide (REGLAN) injection 5-10 mg, 5-10 mg, Intravenous, Q8H PRN, Newt Minion, MD .  ondansetron (ZOFRAN) tablet 4 mg, 4 mg, Oral, Q6H PRN **OR** ondansetron (ZOFRAN) injection 4 mg, 4 mg, Intravenous, Q6H PRN, Newt Minion, MD, 4 mg at 02/01/19 1600 .  oxyCODONE (Oxy IR/ROXICODONE) immediate release tablet 10-15 mg, 10-15 mg, Oral, Q4H PRN, Newt Minion, MD, 10 mg at 02/09/19 0607 .  oxyCODONE (Oxy IR/ROXICODONE) immediate release tablet 5-10 mg, 5-10 mg, Oral, Q4H PRN, Newt Minion, MD, 5 mg at 02/09/19 1003 .  pantoprazole (PROTONIX) EC tablet 40 mg, 40 mg, Oral, Daily, Newt Minion, MD, 40 mg at 02/09/19 1004 .  polyethylene glycol (MIRALAX / GLYCOLAX) packet 17 g, 17 g, Oral, Daily PRN, Newt Minion, MD .  promethazine (PHENERGAN) injection 12.5 mg, 12.5 mg, Intravenous, Q6H PRN, Newt Minion, MD, 12.5 mg at 02/02/19 0248 .  sodium chloride flush (NS) 0.9 % injection 3 mL, 3 mL, Intravenous, Q12H, Newt Minion, MD, 3 mL at 02/09/19 1012  Patients Current Diet:  Diet Order            Diet Carb Modified        Diet Carb Modified Fluid consistency: Thin; Room service appropriate? Yes  Diet effective now              Precautions / Restrictions Precautions Precautions: Fall Precaution Comments: wound VAC Other Brace: R residual limb protector(Limb protector not utilized as too small for residual limb) Restrictions Weight Bearing Restrictions: Yes RLE Weight Bearing: Non weight bearing   Has the patient had 2 or more falls or a fall with injury in the past year? 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 Drove PTA; only used cane vs crutches 1 week prior to hosptialization   Prior Functional Level Self Care: Did the patient need help bathing, dressing, using the toilet or eating? Independent  Indoor Mobility: Did the patient need assistance with walking from room to room (with or without device)? Independent  Stairs: Did the patient need assistance with internal  or external stairs (with or without device)? Independent  Functional Cognition: Did the patient need help planning regular tasks such as shopping or remembering to take medications? Independent  Home Assistive Devices / Equipment Home Assistive Devices/Equipment: Kasandra Knudsen (specify quad or straight), Crutches Home Equipment: Cane - single point, Crutches, Shower seat  Prior Device Use: Indicate devices/aids used by the patient prior to current illness, exacerbation or injury? None of the above  Current Functional Level Cognition  Overall Cognitive Status: Within Functional Limits for tasks assessed Orientation Level: Oriented X4 General Comments: pt with controlled fall during session and tearful/anxious after event. PT provides reassurance to calm patient    Extremity Assessment (includes Sensation/Coordination)  Upper Extremity Assessment: Overall WFL for tasks assessed  Lower Extremity Assessment: Overall WFL for tasks assessed, RLE deficits/detail RLE Deficits / Details: able to perform [facility] hip extension, quad set, SLR. Difficulty controlling RLE limb movement post-operatively due to change in weight of limb RLE Sensation: WNL    ADLs  Overall ADL's : Needs assistance/impaired Eating/Feeding: Independent, Sitting Grooming: Wash/dry hands, Wash/dry face, Set up, Sitting Upper Body Bathing: Set up, Sitting Lower Body Bathing: Moderate assistance, Sitting/lateral leans Upper Body Dressing : Set up, Sitting Lower Body Dressing: Maximal assistance, Moderate assistance, Sitting/lateral leans Toilet Transfer: Minimal assistance, Min guard, RW, Stand-pivot, BSC, Cueing for safety, Cueing for sequencing Toileting- Clothing Manipulation and Hygiene: Sit to/from stand, Moderate assistance Functional mobility during ADLs: Minimal assistance, Min guard, Rolling walker, Cueing for safety, Cueing for sequencing    Mobility  Overal bed mobility: Modified Independent Bed Mobility: Supine to  Sit Supine to sit: Modified independent (Device/Increase time) Sit to supine: Min assist General bed mobility comments: pt modI with use of bed rail and increased time    Transfers  Overall transfer level: Needs assistance Equipment used: Rolling walker (2 wheeled) Transfers: Sit to/from Stand Sit to Stand: Min guard Stand pivot transfers: Min guard General transfer comment: pt min guard with use of RW from bed, recliner, and chair without armrests    Ambulation / Gait / Stairs / Wheelchair Mobility  Ambulation/Gait Ambulation/Gait assistance: Min guard(intermittent periods of close supervision) Gait Distance (Feet): 10 Feet(2 trials of 7' prior to 10') Assistive device: Rolling walker (2 wheeled) Gait Pattern/deviations: (hop to pattern) General Gait Details: hop to gait with short step length, progressing to increased step length after PT verbal cues to do so. Pt with one standing rest break during initial ambulation trial. Pt with controlled fall during final bout of ambulation, as pt experienced L knee buckling during hop to gait. PT assisted pt with use of gait belt down to PT's knee where pt was able to sit temporarily. Pt and PT attempted to stand from this position however pt unable to achieve standing and returned back to PT knee. Pt then slowly assisted to floor from PT knee ~1 ft off ground. Pt reports no significant harm other than mild soreness to residual limb. Gait velocity: reduced Gait velocity interpretation: <1.31 ft/sec, indicative of household Engineer, mining mobility: Yes Wheelchair propulsion: Both upper extremities Wheelchair parts: Supervision/cueing Distance: 200 Wheelchair Assistance Details (indicate cue type and reason): PT providing verbal cues for safety and efficiency during turns    Posture / Balance Balance Overall balance assessment: Needs assistance Sitting-balance support: Bilateral upper extremity supported Sitting  balance-Leahy Scale: Fair Standing balance support: Bilateral upper extremity supported Standing balance-Leahy Scale: Poor    Special needs/care consideration BiPAP/CPAP : no CPM : no Continuous Drip IV : 0.9% sodium chloride infusion; Ceftriaxone  Dialysis : no  Days : no Life Vest : no Oxygen : no Special Bed : no Trach Size : no Wound Vac (area) : yes       Location : RLE surgical incision  Skin: amputation site RLE, ecchymosis to Left abdomen, blister to right leg Bowel mgmt: continent, last BM: 02/07/2019 Bladder mgmt: continent  Diabetic mgmt: yes Behavioral consideration : no Chemo/radiation : no   Previous Home Environment (from acute therapy documentation) Living Arrangements: Spouse/significant other Available Help at Discharge: Family Type of Home: Mobile home Home Layout: One level Home Access: Stairs to enter [employer] of Steps: 2; pt states she can get a ramp built if needed Bathroom Shower/Tub: Chiropodist: Standard Home Care Services: No  Discharge Living Setting Plans for Discharge Living Setting: Patient's home, Lives with (comment)(fiance) Type of Home at Discharge: Mobile home(single wide) Discharge Home Layout: One level Discharge Home Access: Stairs to enter Entrance Stairs-Rails: None Entrance Stairs-Number of Steps: 2 steps to deck and then 1 ledge to enter Discharge Bathroom Shower/Tub: Tub/shower unit Discharge Bathroom Toilet: Standard Discharge Bathroom Accessibility: Yes How Accessible: Accessible via walker Does the patient have any problems obtaining your medications?: No  Social/Family/Support Systems Patient Roles: Other (Comment)(full time nuse tech, 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, mother to 51 yo) Contact Information: fiance: Hendricks Milo [phone]) Anticipated Caregiver: Hendricks Milo, fiance's parents and sister in law live nextdoor and can stay with her as needed;  Anticipated Caregiver's Contact Information: see  above Ability/Limitations of Caregiver: Min A Caregiver Availability: 24/7 Discharge Plan Discussed with Primary Caregiver: Yes Is Caregiver In Agreement with Plan?: Yes Does Caregiver/Family have Issues with Lodging/Transportation while Pt is in Rehab?: No  Visitor: Cytogeneticist  Goals/Additional Needs Patient/Family Goal for Rehab: PT/OT: Mod I; SLP: NA Expected length of stay: 7-10 days Cultural Considerations: NA Dietary Needs: carb modified, thin liquids; calorie level: medium 1600-2000 Equipment Needs: TBD Special Service Needs: new amputation  Pt/Family Agrees to Admission and willing to participate: Yes Program Orientation Provided & Reviewed with Pt/Caregiver Including Roles  & Responsibilities: Yes(Sheila Austin (fiance))  Barriers to Discharge: Home environment access/layout, IV antibiotics(open to building ramp if needed)  Barriers to Discharge Comments: steps to enter; environment (single wide may be tight for wc)  Decrease burden of Care through IP rehab admission: NA  Possible need for SNF placement upon discharge: Not anticipated; pt has great family support. Per fiance, he will take time off work as needed and they have a supportive, local family. Pt motivated to return straight home and as soon as possible. Pt has progressed well on acute and anticipate continued progress on CIR.   Patient Condition: I have reviewed medical records from [facility], spoken with RN, MD, and patient and her fiance. I met with patient at the bedside for inpatient rehabilitation assessment.  Patient will benefit from ongoing PT and OT, can actively participate in 3 hours of therapy a day 5 days of the week, and can make measurable gains during the admission.  Patient will also benefit from the coordinated team approach during an Inpatient Acute Rehabilitation admission.  The patient will receive intensive therapy as well as Rehabilitation physician, nursing, social worker, and care  management interventions.  Due to safety, skin/wound care, disease management, medication administration, pain management and patient education the patient requires 24 hour a day rehabilitation nursing.  The patient is currently Min G with mobility and Min to Max A for basic ADLs.  Discharge setting and therapy post discharge at home with home health  is anticipated.  Patient has agreed to participate in the Acute Inpatient Rehabilitation Program and will admit 02/09/2019.  Preadmission Screen Completed By:  Raechel Ache, 02/09/2019 10:57 AM ______________________________________________________________________   Discussed status with Dr. Posey Pronto on 02/09/2019 at 10:59AM and received approval for admission 02/09/2019.   Admission Coordinator:  Raechel Ache, OT, time 10:59AM/Date 02/09/2019   Assessment/Plan: Diagnosis: Right BKA  1. Does the need for close, 24 hr/day Medical supervision in concert with the patient's rehab needs make it unreasonable for this patient to be served in a less intensive setting? Potentially  2. Co-Morbidities requiring supervision/potential complications: O7MB,  HTN, medical non-compliance for years, recent depressive episode due to loss of mother, morbid obesity, fatty liver 3. Due to safety, skin/wound care, pain management and patient education, does the patient require 24 hr/day rehab nursing? Potentially 4. Does the patient require coordinated care of a physician, rehab nurse, PT (1-2 hrs/day, 5 days/week) and OT (1-2 hrs/day, 5 days/week) to address physical and functional deficits in the context of the above medical diagnosis(es)? Potentially Addressing deficits in the following areas: balance, endurance, locomotion, strength, transferring, bathing, dressing, toileting and psychosocial support 5. Can the patient actively participate in an intensive therapy program of at least 3 hrs of therapy 5 days a week? Yes 6. The potential for patient to make measurable gains while on  inpatient rehab is good 7. Anticipated functional outcomes upon discharge from inpatients are: modified independent PT, modified independent OT, n/a SLP 8. Estimated rehab length of stay to reach the above functional goals is: 3-4 days. 9. Anticipated D/C setting: Home 10. Anticipated post D/C treatments: HH therapy and Home excercise program 11. Overall Rehab/Functional Prognosis: excellent  MD Signature: Delice Lesch, MD, ABPMR

## 2019-02-06 NOTE — Evaluation (Signed)
Occupational Therapy Evaluation Patient Details Name: Sheila Austin MRN: [id number] DOB: 09-Jan-1986 Today's Date: 02/06/2019    History of Present Illness Pt is a 33 y/o female admitted to ED with emesis, fever, R foot sore with cellulitis and osteomyelitis of 5th metatarsal, cuboid, calcaneous. 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 PMH includes DM not medically managed, gastroparesis, obesity, R nonhealing foot wound with I&D 01/03/19, HTN, migraines.   Clinical Impression   Pt with decline in function and safety with ADLs and ADL mobility with impaired balance and endurance. PTA, pt reports that she was independent with ADLs/selfcare, child care and was working. Pt currently requires max - mod A with LB ADLs, mod A with toileting and min A with mobility using RW. Pt would benefit from acute OT services to address impairments to maximize level of function and safety    Follow Up Recommendations  CIR    Equipment Recommendations  3 in 1 bedside commode;Other (comment)(reacher)    Recommendations for Other Services       Precautions / Restrictions Precautions Precautions: Fall Required Braces or Orthoses: Other Brace Other Brace: R residual limb protector Restrictions Weight Bearing Restrictions: Yes RLE Weight Bearing: Non weight bearing      Mobility Bed Mobility Overal bed mobility: Needs Assistance Bed Mobility: Supine to Sit     Supine to sit: Supervision     General bed mobility comments: supervision for safety  Transfers Overall transfer level: Needs assistance Equipment used: Rolling walker (2 wheeled) Transfers: Sit to/from [facility] Sit to Stand: Min assist Stand pivot transfers: Min guard       General transfer comment: cueing for safety and technique, min A for stability with transitional movement    Balance Overall balance assessment: Needs assistance Sitting-balance support: Bilateral upper extremity supported Sitting  balance-Leahy Scale: Good     Standing balance support: Bilateral upper extremity supported;During functional activity Standing balance-Leahy Scale: Poor                             ADL either performed or assessed with clinical judgement   ADL Overall ADL's : Needs assistance/impaired Eating/Feeding: Independent;Sitting   Grooming: Wash/dry hands;Wash/dry face;Set up;Sitting   Upper Body Bathing: Set up;Sitting   Lower Body Bathing: Moderate assistance;Sitting/lateral leans   Upper Body Dressing : Set up;Sitting   Lower Body Dressing: Maximal assistance;Moderate assistance;Sitting/lateral leans   Toilet Transfer: Minimal assistance;Min guard;RW;Stand-pivot;BSC;Cueing for safety;Cueing for sequencing   Toileting- Clothing Manipulation and Hygiene: Sit to/from stand;Moderate assistance       Functional mobility during ADLs: Minimal assistance;Min guard;Rolling walker;Cueing for safety;Cueing for sequencing       Vision Patient Visual Report: No change from baseline       Perception     Praxis      Pertinent Vitals/Pain Pain Assessment: 0-10 Pain Score: 2  Pain Descriptors / Indicators: Discomfort Pain Intervention(s): Monitored during session;Repositioned     Hand Dominance Right   Extremity/Trunk Assessment Upper Extremity Assessment Upper Extremity Assessment: Overall WFL for tasks assessed       Cervical / Trunk Assessment Cervical / Trunk Assessment: Normal   Communication Communication Communication: No difficulties   Cognition Arousal/Alertness: Awake/alert Behavior During Therapy: Anxious Overall Cognitive Status: Within Functional Limits for tasks assessed  General Comments       Exercises     Shoulder Instructions      Home Living Family/patient expects to be discharged to:: Private residence Living Arrangements: Spouse/significant other Available Help at Discharge:  Family Type of Home: Mobile home Home Access: Stairs to enter Entrance Stairs-Number of Steps: 2; pt states she can get a ramp built if needed   Home Layout: One level     Bathroom Shower/Tub: Teacher, early years/pre: Standard     Home Equipment: Cane - single point;Crutches;Shower seat          Prior Functioning/Environment Level of Independence: Independent with assistive device(s)        Comments: pt reports using cane and single crutch interchangeably as needed for ambulation PTA, 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 Pt with 65 Y/o daughter, fiance works and family are pt's neighbors        OT Problem List: Decreased activity tolerance;Impaired balance (sitting and/or standing);Pain;Decreased knowledge of use of DME or AE;Decreased coordination      OT Treatment/Interventions: Self-care/ADL training;DME and/or AE instruction;Therapeutic activities;Patient/family education    OT Goals(Current goals can be found in the care plan section) Acute Rehab OT Goals Patient Stated Goal: return to independence OT Goal Formulation: With patient Time For Goal Achievement: 02/20/19 Potential to Achieve Goals: Good ADL Goals Pt Will Perform Lower Body Bathing: with min assist;sitting/lateral leans Pt Will Perform Lower Body Dressing: with mod assist;with min assist;with adaptive equipment;sitting/lateral leans Pt Will Transfer to Toilet: with min guard assist;with supervision;ambulating;bedside commode Pt Will Perform Toileting - Clothing Manipulation and hygiene: with min assist;sitting/lateral leans;sit to/from stand  OT Frequency: Min 2X/week   Barriers to D/C:            Co-evaluation              AM-PAC OT "6 Clicks" Daily Activity     Outcome Measure Help from another person eating meals?: None Help from another person taking care of personal grooming?: A Little Help from another person toileting, which includes using toliet,  bedpan, or urinal?: A Lot Help from another person bathing (including washing, rinsing, drying)?: A Lot Help from another person to put on and taking off regular upper body clothing?: A Little Help from another person to put on and taking off regular lower body clothing?: A Lot 6 Click Score: 16   End of Session Equipment Utilized During Treatment: Gait belt;Rolling walker;Other (comment)(BSC)  Activity Tolerance: Patient tolerated treatment well Patient left: in chair;with call bell/phone within reach;Other (comment)(with PT)  OT Visit Diagnosis: Unsteadiness on feet (R26.81);Other abnormalities of gait and mobility (R26.89);Pain Pain - Right/Left: Right Pain - part of body: Leg                Time: 0950-1016 OT Time Calculation (min): 26 min Charges:  OT General Charges $OT Visit: 1 Visit OT Evaluation $OT Eval Low Complexity: 1 Low OT Treatments $Self Care/Home Management : 8-22 mins    Britt Bottom 02/06/2019, 10:58 AM

## 2019-02-06 NOTE — Plan of Care (Signed)

## 2019-02-07 LAB — MAGNESIUM: Magnesium: 1.6 mg/dL — ABNORMAL LOW (ref 1.7–2.4)

## 2019-02-07 LAB — CBC
HCT: 21.3 % — ABNORMAL LOW (ref 36.0–46.0)
Hemoglobin: 6.6 g/dL — CL (ref 12.0–15.0)
MCH: 26.7 pg (ref 26.0–34.0)
MCHC: 31 g/dL (ref 30.0–36.0)
MCV: 86.2 fL (ref 80.0–100.0)
Platelets: 419 10*3/uL — ABNORMAL HIGH (ref 150–400)
RBC: 2.47 MIL/uL — ABNORMAL LOW (ref 3.87–5.11)
RDW: 14.3 % (ref 11.5–15.5)
WBC: 14.4 10*3/uL — ABNORMAL HIGH (ref 4.0–10.5)
nRBC: 0 % (ref 0.0–0.2)

## 2019-02-07 LAB — BASIC METABOLIC PANEL
Anion gap: 8 (ref 5–15)
BUN: 20 mg/dL (ref 6–20)
CO2: 22 mmol/L (ref 22–32)
Calcium: 6.9 mg/dL — ABNORMAL LOW (ref 8.9–10.3)
Chloride: 103 mmol/L (ref 98–111)
Creatinine, Ser: 1 mg/dL (ref 0.44–1.00)
GFR calc Af Amer: >60 mL/min (ref >60)
GFR calc non Af Amer: >60 mL/min (ref >60)
Glucose, Bld: 199 mg/dL — ABNORMAL HIGH (ref 70–99)
Potassium: 3.7 mmol/L (ref 3.5–5.1)
Sodium: 133 mmol/L — ABNORMAL LOW (ref 135–145)

## 2019-02-07 LAB — GLUCOSE, CAPILLARY
Glucose-Capillary: 187 mg/dL — ABNORMAL HIGH (ref 70–99)
Glucose-Capillary: 200 mg/dL — ABNORMAL HIGH (ref 70–99)
Glucose-Capillary: 164 mg/dL — ABNORMAL HIGH (ref 70–99)
Glucose-Capillary: 157 mg/dL — ABNORMAL HIGH (ref 70–99)
Glucose-Capillary: 167 mg/dL — ABNORMAL HIGH (ref 70–99)

## 2019-02-07 LAB — PREPARE RBC (CROSSMATCH)

## 2019-02-07 MED ORDER — SODIUM CHLORIDE 0.9% IV SOLUTION
Freq: Once | INTRAVENOUS | Status: AC
  Administered 2019-02-07: 12:00:00 via INTRAVENOUS

## 2019-02-07 NOTE — Progress Notes (Signed)
Inpatient Rehabilitation-Admissions Coordinator   Noted pt's hemoglobin has dropped to 6.6 this AM. Spoke with admitting PM&R MD, Dr. Dagoberto Ligas regarding status. Dr. Dagoberto Ligas requests to hold on admitting and we will reassess Monday for possible admit. I will contact Dr. Cathlean Sauer this morning to notify that we will not accept the pt to CIR today due to acute medical issues.    Please call if questions.   Jhonnie Garner, OTR/L  Rehab Admissions Coordinator  [phone] 02/07/2019 9:33 AM

## 2019-02-07 NOTE — Progress Notes (Signed)
CRITICAL VALUE ALERT  Critical Value:  Hemoglobin 6.6  Date & Time Notified:  02/07/19 0800  Provider Notified: Dr. Cathlean Sauer  Orders Received/Actions taken: Transfusion ordered

## 2019-02-07 NOTE — Progress Notes (Signed)
Patient ID: Sheila Austin, female   DOB: 07-03-1985, 33 y.o.   MRN: [id number] Patient is status post right transtibial amputation.  The infection extended proximal to the residual limb.  Anticipate discharge to inpatient rehab.  Cultures are pending infectious disease to coordinate antibiotics.  Continue with the wound VAC dressing and discharge to inpatient rehab with the Praveena portable wound VAC pump that is in her room.

## 2019-02-07 NOTE — Progress Notes (Signed)
PROGRESS NOTE    Sheila Austin  [id number] DOB: 19-Feb-1986 DOA: 01/31/2019 PCP: Maximiano Coss, NP    Brief Narrative:  33 year old female who presented with right foot pain.  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  Recently diagnosed with right diabetic foot infection status post debridement.  She left the hospital [place].  She returned due to severe nausea vomiting for about 4 days, associated with fevers.   She was admitted to the hospital working diagnosis of sepsis due to right foot cellulitis.  Further work-up revealed osteomyelitis and patient underwent below-knee amputation on September 30.  Currently has been on antibiotics intravenously and plan for discharge to inpatient rehab.  Her transfer has been held due to anemia requiring 1 unit packed red blood cell transfusion.   Assessment & Plan:   Principal Problem:   Diabetic foot ulcer with osteomyelitis ([place]) Active Problems:   Diabetes mellitus out of control ([place])   Essential hypertension, benign   Gastroparesis   BMI 45.0-49.9, adult (HCC)   Adjustment disorder   Sepsis due to cellulitis ([place])   Skin ulcer of right foot with necrosis of muscle (HCC)   Severe protein-calorie malnutrition (HCC)   Abscess of leg, right   Sepsis with acute organ dysfunction without septic shock (HCC)   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HCC)   Streptococcal bacteremia   Klebsiella pneumoniae infection   1. Sepsis due to right foot osteomyelitis fifth metatarsal, cuboid and calcaneous bones/ septic arthritis and right foot cellulitis.  Streptococcus intermedius bacteremia Patient antibiotic therapy including ceftriaxone that we will continue at inpatient rehab.  Continue local wound care.  Follow-up with infection disease recommendations.  2.  Multifactorial anemia.  Hemoglobin dropped to 6.6 and hematocrit 21.3 today.  Patient will  receive 1 unit packed red blood cells, repeat cell count in the morning.  No signs of active bleeding.  3.  Stage III chronic kidney disease. Renal function has remained stable.  4. HTN. Continue blood pressure monitoring.  5. Obesity. Calculated bmi id 45   DVT prophylaxis: enoxaparin   Code Status: full Family Communication: no family at the bedside  Disposition Plan/ discharge barriers: pending Hgb stable before transfer to CIR.   Body mass index is 45.36 kg/m. Malnutrition Type:  Nutrition Problem: Increased nutrient needs Etiology: wound healing   Malnutrition Characteristics:  Signs/Symptoms: estimated needs   Nutrition Interventions:  Interventions: Prostat, Glucerna shake  RN Pressure Injury Documentation:   :       Subjective: Patient is feeling well, right leg pain is controlled, with no nausea or vomiting, no chest pain or dyspnea.   Objective: Vitals:   02/07/19 0423 02/07/19 0800 02/07/19 1151 02/07/19 1210  BP: 115/73 126/82 131/79 133/90  Pulse: (!) 102 97 (!) 106 (!) 106  Resp:   18 18  Temp: 98.4 F (36.9 C) 98.3 F (36.8 C) 98.7 F (37.1 C) 99.5 F (37.5 C)  TempSrc: Oral Oral Oral Oral  SpO2: 99% 100% 100% 100%  Weight:      Height:        Intake/Output Summary (Last 24 hours) at 02/07/2019 1434 Last data filed at 02/07/2019 1300 Gross per 24 hour  Intake 3590 ml  Output 1 ml  Net 3589 ml   Filed Weights   02/01/19 0034 02/04/19 1010  Weight: 112.5 kg 112.5 kg    Examination:   General: Not in pain or dyspnea, deconditioned  Neurology: Awake and alert, non focal  E ENT: no pallor, no icterus, oral mucosa moist Cardiovascular: No JVD. S1-S2 present, rhythmic, no gallops, rubs, or murmurs. No lower extremity edema. Pulmonary: vesicular breath sounds bilaterally, adequate air movement, no wheezing, rhonchi or rales. Gastrointestinal. Abdomen  with, no organomegaly, non tender, no rebound or guarding Skin. No rashes  Musculoskeletal: right BKA      Data Reviewed: I have personally reviewed following labs and imaging studies  CBC: Recent Labs  Lab 02/01/19 0033  02/03/19 0546 02/03/19 2027 02/04/19 0229 02/05/19 0212 02/06/19 0418 02/07/19 0617  WBC 35.6*   < > 26.5*  --  26.8* 21.7* 16.5* 14.4*  NEUTROABS 32.3*  --   --   --   --   --   --   --   HGB 9.5*   < > 7.4* 8.4* 7.9* 7.2* 7.0* 6.6*  HCT 28.7*   < > 23.2* 25.5* 25.3* 23.5* 22.9* 21.3*  MCV 84.2   < > 86.2  --  86.1 86.7 86.4 86.2  PLT 461*   < > 380  --  411* 441* 475* 419*   < > = values in this interval not displayed.   Basic Metabolic Panel: Recent Labs  Lab 02/03/19 0546 02/04/19 0229 02/05/19 0212 02/06/19 0418 02/07/19 0617  NA 133* 130* 131* 133* 133*  K 3.3* 3.7 3.5 3.6 3.7  CL 98 99 99 101 103  CO2 25 23 22 23 22   GLUCOSE 182* 114* 148* 165* 199*  BUN 21* 23* 21* 18 20  CREATININE 1.13* 1.09* 1.02* 1.05* 1.00  CALCIUM 7.2* 6.9* 6.9* 6.8* 6.9*  MG  --   --   --  1.5* 1.6*   GFR: Estimated Creatinine Clearance: 96.6 mL/min (by C-G formula based on SCr of 1 mg/dL). Liver Function Tests: Recent Labs  Lab 02/01/19 0033  AST 15  ALT 12  ALKPHOS 215*  BILITOT 1.2  PROT 7.9  ALBUMIN 2.0*   No results for input(s): LIPASE, AMYLASE in the last 168 hours. No results for input(s): AMMONIA in the last 168 hours. Coagulation Profile: Recent Labs  Lab 02/01/19 0150  INR 1.2   Cardiac Enzymes: No results for input(s): CKTOTAL, CKMB, CKMBINDEX, TROPONINI in the last 168 hours. BNP (last 3 results) No results for input(s): PROBNP in the last 8760 hours. HbA1C: No results for input(s): HGBA1C in the last 72 hours. CBG: Recent Labs  Lab 02/06/19 1656 02/06/19 2111 02/07/19 0635 02/07/19 0932 02/07/19 1241  GLUCAP 156* 150* 187* 200* 164*   Lipid Profile: No results for input(s): CHOL, HDL, LDLCALC, TRIG, CHOLHDL, LDLDIRECT in the last 72 hours. Thyroid Function Tests: No results for input(s): TSH,  T4TOTAL, FREET4, T3FREE, THYROIDAB in the last 72 hours. Anemia Panel: Recent Labs    02/05/19 0212  VITAMINB12 537  FOLATE 4.0*  FERRITIN 495*  TIBC 109*  IRON 8*  RETICCTPCT 1.4      Radiology Studies: I have reviewed all of the imaging during this hospital visit personally     Scheduled Meds: . docusate sodium  100 mg Oral BID  . feeding supplement (GLUCERNA SHAKE)  237 mL Oral Q1500  . feeding supplement (PRO-STAT SUGAR FREE 64)  30 mL Oral BID  . heparin  5,000 Units Subcutaneous Q8H  . insulin aspart  0-20 Units Subcutaneous TID WC  . insulin aspart  0-5 Units Subcutaneous QHS  . insulin glargine  25 Units Subcutaneous Daily  . metoCLOPramide (REGLAN) injection  5  mg Intravenous Q6H  . pantoprazole  40 mg Oral Daily  . sodium chloride flush  3 mL Intravenous Q12H   Continuous Infusions: . sodium chloride    . cefTRIAXone (ROCEPHIN)  IV 2 g (02/06/19 1256)  . methocarbamol (ROBAXIN) IV       LOS: 6 days        Kharter Brew Gerome Apley, MD

## 2019-02-08 ENCOUNTER — Inpatient Hospital Stay (HOSPITAL_COMMUNITY): Payer: 59

## 2019-02-08 DIAGNOSIS — D649 Anemia, unspecified: Secondary | ICD-10-CM

## 2019-02-08 LAB — BPAM RBC
Blood Product Expiration Date: 202010212359
ISSUE DATE / TIME: 202010031144
Unit Type and Rh: 6200

## 2019-02-08 LAB — GLUCOSE, CAPILLARY
Glucose-Capillary: 206 mg/dL — ABNORMAL HIGH (ref 70–99)
Glucose-Capillary: 174 mg/dL — ABNORMAL HIGH (ref 70–99)
Glucose-Capillary: 193 mg/dL — ABNORMAL HIGH (ref 70–99)
Glucose-Capillary: 142 mg/dL — ABNORMAL HIGH (ref 70–99)

## 2019-02-08 LAB — TYPE AND SCREEN
ABO/RH(D): A POS
Antibody Screen: NEGATIVE
Unit division: 0

## 2019-02-08 LAB — HEMOGLOBIN AND HEMATOCRIT, BLOOD
HCT: 24.7 % — ABNORMAL LOW (ref 36.0–46.0)
Hemoglobin: 7.7 g/dL — ABNORMAL LOW (ref 12.0–15.0)

## 2019-02-08 NOTE — Progress Notes (Signed)
Pt hgb 6.6. MD paged. Awaiting orders. Will continue to monitor pt.

## 2019-02-08 NOTE — Progress Notes (Signed)
PROGRESS NOTE    Sheila Austin  [id number] DOB: [birth year] DOA: 01/31/2019 PCP: Maximiano Coss, NP    Brief Narrative:  33 year old female who presented with right foot pain.  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  Recently diagnosed with right diabetic foot infection status post debridement.  She left the hospital [place].  She returned due to severe nausea vomiting for about 4 days, associated with fevers.   She was admitted to the hospital working diagnosis of sepsis due to right foot cellulitis.  Further work-up revealed osteomyelitis and patient underwent below-knee amputation on September 30.  Currently has been on antibiotics intravenously and plan for discharge to inpatient rehab.  Her transfer has been held due to anemia requiring 1 unit packed red blood cell transfusion.  Post one unit PRBC her Hgb is up to 7,7 with Hct at 24.7. Plan to transfer to Inpatient Rehab in am.     Assessment & Plan:   Principal Problem:   Diabetic foot ulcer with osteomyelitis ([place]) Active Problems:   Diabetes mellitus out of control ([place])   Essential hypertension, benign   Gastroparesis   BMI 45.0-49.9, adult (HCC)   Adjustment disorder   Sepsis due to cellulitis (HCC)   Skin ulcer of right foot with necrosis of muscle (HCC)   Severe protein-calorie malnutrition (HCC)   Abscess of leg, right   Sepsis with acute organ dysfunction without septic shock (HCC)   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HCC)   Streptococcal bacteremia   Klebsiella pneumoniae infection    1. Sepsis due to right foot osteomyelitis fifth metatarsal, cuboid and calcaneous bones/ septic arthritis and right foot cellulitis.  Streptococcus intermedius bacteremia Continue with ceftriaxone for antibiotic therapy, until October 28, infection disease will follow-up as an outpatient. Continue local wound care.    2.   Multifactorial anemia.  Likely multifactorial, patient tolerated well 1 unit packed red blood cells.  Repeat hemoglobin 7.7  3.  Stage III chronic kidney disease. Discharge creatinine 1.0, follow-up as an outpatient.  4. HTN.  Patient off antihypertensive medications.  5. Obesity.  Her calculated bmi id 45   DVT prophylaxis: enoxaparin   Code Status: full Family Communication: no family at the bedside  Disposition Plan/ discharge barriers: plan for dc in am to inpatient rehab.    Body mass index is 45.36 kg/m. Malnutrition Type:  Nutrition Problem: Increased nutrient needs Etiology: wound healing   Malnutrition Characteristics:  Signs/Symptoms: estimated needs   Nutrition Interventions:  Interventions: Prostat, Glucerna shake  RN Pressure Injury Documentation:     Subjective: Patient tolerated [facility] transfusion well, no nausea or vomiting, surgical pain is well controlled.   Objective: Vitals:   02/07/19 1555 02/07/19 2003 02/08/19 0446 02/08/19 0856  BP: (!) 143/87 137/80 126/79 (!) 145/92  Pulse: (!) 110 (!) 108 (!) 103 (!) 105  Resp: 18   15  Temp: 98.7 F (37.1 C) 99.9 F (37.7 C) 98.4 F (36.9 C) 98.7 F (37.1 C)  TempSrc: Oral Oral Oral Oral  SpO2: 100% 99% 98% 100%  Weight:      Height:        Intake/Output Summary (Last 24 hours) at 02/08/2019 1157 Last data filed at 02/08/2019 0700 Gross per 24 hour  Intake 960 ml  Output 1 ml  Net 959 ml   Filed Weights   02/01/19 0034 02/04/19 1010  Weight: 112.5 kg 112.5 kg  Examination:   General: Not in pain or dyspnea.  Neurology: Awake and alert, non focal  E ENT: no pallor, no icterus, oral mucosa moist Cardiovascular: No JVD. S1-S2 present, rhythmic, no gallops, rubs, or murmurs. No lower extremity edema. Pulmonary: vesicular breath sounds bilaterally, adequate air movement, no wheezing, rhonchi or rales. Gastrointestinal. Abdomen with  no organomegaly, non tender, no rebound or guarding  Skin. No rashes Musculoskeletal: right BKA      Data Reviewed: I have personally reviewed following labs and imaging studies  CBC: Recent Labs  Lab 02/03/19 0546  02/04/19 0229 02/05/19 0212 02/06/19 0418 02/07/19 0617 02/08/19 1104  WBC 26.5*  --  26.8* 21.7* 16.5* 14.4*  --   HGB 7.4*   < > 7.9* 7.2* 7.0* 6.6* 7.7*  HCT 23.2*   < > 25.3* 23.5* 22.9* 21.3* 24.7*  MCV 86.2  --  86.1 86.7 86.4 86.2  --   PLT 380  --  411* 441* 475* 419*  --    < > = values in this interval not displayed.   Basic Metabolic Panel: Recent Labs  Lab 02/03/19 0546 02/04/19 0229 02/05/19 0212 02/06/19 0418 02/07/19 0617  NA 133* 130* 131* 133* 133*  K 3.3* 3.7 3.5 3.6 3.7  CL 98 99 99 101 103  CO2 25 23 22 23 22   GLUCOSE 182* 114* 148* 165* 199*  BUN 21* 23* 21* 18 20  CREATININE 1.13* 1.09* 1.02* 1.05* 1.00  CALCIUM 7.2* 6.9* 6.9* 6.8* 6.9*  MG  --   --   --  1.5* 1.6*   GFR: Estimated Creatinine Clearance: 96.6 mL/min (by C-G formula based on SCr of 1 mg/dL). Liver Function Tests: No results for input(s): AST, ALT, ALKPHOS, BILITOT, PROT, ALBUMIN in the last 168 hours. No results for input(s): LIPASE, AMYLASE in the last 168 hours. No results for input(s): AMMONIA in the last 168 hours. Coagulation Profile: No results for input(s): INR, PROTIME in the last 168 hours. Cardiac Enzymes: No results for input(s): CKTOTAL, CKMB, CKMBINDEX, TROPONINI in the last 168 hours. BNP (last 3 results) No results for input(s): PROBNP in the last 8760 hours. HbA1C: No results for input(s): HGBA1C in the last 72 hours. CBG: Recent Labs  Lab 02/07/19 1241 02/07/19 1642 02/07/19 2129 02/08/19 0631 02/08/19 1148  GLUCAP 164* 157* 167* 206* 174*   Lipid Profile: No results for input(s): CHOL, HDL, LDLCALC, TRIG, CHOLHDL, LDLDIRECT in the last 72 hours. Thyroid Function Tests: No results for input(s): TSH, T4TOTAL, FREET4, T3FREE, THYROIDAB in the last 72 hours. Anemia Panel: No results  for input(s): VITAMINB12, FOLATE, FERRITIN, TIBC, IRON, RETICCTPCT in the last 72 hours.    Radiology Studies: I have reviewed all of the imaging during this hospital visit personally     Scheduled Meds: . docusate sodium  100 mg Oral BID  . feeding supplement (GLUCERNA SHAKE)  237 mL Oral Q1500  . feeding supplement (PRO-STAT SUGAR FREE 64)  30 mL Oral BID  . heparin  5,000 Units Subcutaneous Q8H  . insulin aspart  0-20 Units Subcutaneous TID WC  . insulin aspart  0-5 Units Subcutaneous QHS  . insulin glargine  25 Units Subcutaneous Daily  . metoCLOPramide (REGLAN) injection  5 mg Intravenous Q6H  . pantoprazole  40 mg Oral Daily  . sodium chloride flush  3 mL Intravenous Q12H   Continuous Infusions: . sodium chloride 10 mL/hr at 02/08/19 0633  . cefTRIAXone (ROCEPHIN)  IV Stopped (02/07/19 1747)  . methocarbamol (ROBAXIN) IV  LOS: 7 days        Mauricio Gerome Apley, MD

## 2019-02-08 NOTE — Plan of Care (Signed)

## 2019-02-09 ENCOUNTER — Other Ambulatory Visit: Payer: Self-pay

## 2019-02-09 ENCOUNTER — Inpatient Hospital Stay (HOSPITAL_COMMUNITY)
Admission: RE | Admit: 2019-02-09 | Discharge: 2019-02-19 | DRG: 559 | Disposition: A | Discharge status: Home Health | Payer: 59 | Source: Intra-hospital | Attending: Physical Medicine & Rehabilitation | Admitting: Physical Medicine & Rehabilitation

## 2019-02-09 ENCOUNTER — Inpatient Hospital Stay (HOSPITAL_COMMUNITY): Payer: 59

## 2019-02-09 ENCOUNTER — Encounter (HOSPITAL_COMMUNITY): Payer: Self-pay

## 2019-02-09 ENCOUNTER — Inpatient Hospital Stay (HOSPITAL_COMMUNITY): Payer: 59 | Admitting: Occupational Therapy

## 2019-02-09 ENCOUNTER — Inpatient Hospital Stay (HOSPITAL_COMMUNITY): Payer: 59 | Admitting: Physical Therapy

## 2019-02-09 DIAGNOSIS — Z880 Allergy status to penicillin: Secondary | ICD-10-CM

## 2019-02-09 DIAGNOSIS — E1165 Type 2 diabetes mellitus with hyperglycemia: Secondary | ICD-10-CM | POA: Diagnosis present

## 2019-02-09 DIAGNOSIS — E875 Hyperkalemia: Secondary | ICD-10-CM | POA: Diagnosis not present

## 2019-02-09 DIAGNOSIS — Z634 Disappearance and death of family member: Secondary | ICD-10-CM

## 2019-02-09 DIAGNOSIS — Z8349 Family history of other endocrine, nutritional and metabolic diseases: Secondary | ICD-10-CM

## 2019-02-09 DIAGNOSIS — E871 Hypo-osmolality and hyponatremia: Secondary | ICD-10-CM

## 2019-02-09 DIAGNOSIS — Z825 Family history of asthma and other chronic lower respiratory diseases: Secondary | ICD-10-CM

## 2019-02-09 DIAGNOSIS — A4159 Other Gram-negative sepsis: Secondary | ICD-10-CM | POA: Diagnosis present

## 2019-02-09 DIAGNOSIS — E46 Unspecified protein-calorie malnutrition: Secondary | ICD-10-CM | POA: Diagnosis not present

## 2019-02-09 DIAGNOSIS — E1169 Type 2 diabetes mellitus with other specified complication: Secondary | ICD-10-CM | POA: Diagnosis present

## 2019-02-09 DIAGNOSIS — G8918 Other acute postprocedural pain: Secondary | ICD-10-CM

## 2019-02-09 DIAGNOSIS — E1143 Type 2 diabetes mellitus with diabetic autonomic (poly)neuropathy: Secondary | ICD-10-CM | POA: Diagnosis not present

## 2019-02-09 DIAGNOSIS — E43 Unspecified severe protein-calorie malnutrition: Secondary | ICD-10-CM | POA: Diagnosis not present

## 2019-02-09 DIAGNOSIS — Z6841 Body Mass Index (BMI) 40.0 and over, adult: Secondary | ICD-10-CM

## 2019-02-09 DIAGNOSIS — Z794 Long term (current) use of insulin: Secondary | ICD-10-CM | POA: Diagnosis not present

## 2019-02-09 DIAGNOSIS — F329 Major depressive disorder, single episode, unspecified: Secondary | ICD-10-CM | POA: Diagnosis present

## 2019-02-09 DIAGNOSIS — R1110 Vomiting, unspecified: Secondary | ICD-10-CM | POA: Diagnosis not present

## 2019-02-09 DIAGNOSIS — Z89511 Acquired absence of right leg below knee: Secondary | ICD-10-CM

## 2019-02-09 DIAGNOSIS — S88111S Complete traumatic amputation at level between knee and ankle, right lower leg, sequela: Secondary | ICD-10-CM

## 2019-02-09 DIAGNOSIS — D509 Iron deficiency anemia, unspecified: Secondary | ICD-10-CM | POA: Diagnosis present

## 2019-02-09 DIAGNOSIS — D62 Acute posthemorrhagic anemia: Secondary | ICD-10-CM

## 2019-02-09 DIAGNOSIS — S88111A Complete traumatic amputation at level between knee and ankle, right lower leg, initial encounter: Secondary | ICD-10-CM | POA: Diagnosis not present

## 2019-02-09 DIAGNOSIS — Z9119 Patient's noncompliance with other medical treatment and regimen: Secondary | ICD-10-CM | POA: Diagnosis not present

## 2019-02-09 DIAGNOSIS — Z8261 Family history of arthritis: Secondary | ICD-10-CM

## 2019-02-09 DIAGNOSIS — Z841 Family history of disorders of kidney and ureter: Secondary | ICD-10-CM

## 2019-02-09 DIAGNOSIS — R7881 Bacteremia: Secondary | ICD-10-CM

## 2019-02-09 DIAGNOSIS — Z4781 Encounter for orthopedic aftercare following surgical amputation: Principal | ICD-10-CM

## 2019-02-09 DIAGNOSIS — Z833 Family history of diabetes mellitus: Secondary | ICD-10-CM

## 2019-02-09 DIAGNOSIS — B955 Unspecified streptococcus as the cause of diseases classified elsewhere: Secondary | ICD-10-CM

## 2019-02-09 DIAGNOSIS — R339 Retention of urine, unspecified: Secondary | ICD-10-CM | POA: Diagnosis not present

## 2019-02-09 DIAGNOSIS — K219 Gastro-esophageal reflux disease without esophagitis: Secondary | ICD-10-CM | POA: Diagnosis present

## 2019-02-09 DIAGNOSIS — Z818 Family history of other mental and behavioral disorders: Secondary | ICD-10-CM

## 2019-02-09 DIAGNOSIS — R000 Tachycardia, unspecified: Secondary | ICD-10-CM

## 2019-02-09 DIAGNOSIS — L0390 Cellulitis, unspecified: Secondary | ICD-10-CM | POA: Diagnosis not present

## 2019-02-09 DIAGNOSIS — Z8249 Family history of ischemic heart disease and other diseases of the circulatory system: Secondary | ICD-10-CM

## 2019-02-09 DIAGNOSIS — K760 Fatty (change of) liver, not elsewhere classified: Secondary | ICD-10-CM | POA: Diagnosis present

## 2019-02-09 DIAGNOSIS — Z823 Family history of stroke: Secondary | ICD-10-CM

## 2019-02-09 DIAGNOSIS — I10 Essential (primary) hypertension: Secondary | ICD-10-CM | POA: Diagnosis not present

## 2019-02-09 DIAGNOSIS — IMO0002 Reserved for concepts with insufficient information to code with codable children: Secondary | ICD-10-CM | POA: Diagnosis present

## 2019-02-09 DIAGNOSIS — Z91030 Bee allergy status: Secondary | ICD-10-CM

## 2019-02-09 DIAGNOSIS — A419 Sepsis, unspecified organism: Secondary | ICD-10-CM | POA: Diagnosis present

## 2019-02-09 LAB — CBC WITH DIFFERENTIAL/PLATELET
Abs Immature Granulocytes: 0.47 10*3/uL — ABNORMAL HIGH (ref 0.00–0.07)
Basophils Absolute: 0.1 10*3/uL (ref 0.0–0.1)
Basophils Relative: 0 %
Eosinophils Absolute: 0.3 10*3/uL (ref 0.0–0.5)
Eosinophils Relative: 2 %
HCT: 25.4 % — ABNORMAL LOW (ref 36.0–46.0)
Hemoglobin: 7.9 g/dL — ABNORMAL LOW (ref 12.0–15.0)
Immature Granulocytes: 3 %
Lymphocytes Relative: 14 %
Lymphs Abs: 2.3 10*3/uL (ref 0.7–4.0)
MCH: 27.5 pg (ref 26.0–34.0)
MCHC: 31.1 g/dL (ref 30.0–36.0)
MCV: 88.5 fL (ref 80.0–100.0)
Monocytes Absolute: 1.3 10*3/uL — ABNORMAL HIGH (ref 0.1–1.0)
Monocytes Relative: 8 %
Neutro Abs: 11.5 10*3/uL — ABNORMAL HIGH (ref 1.7–7.7)
Neutrophils Relative %: 73 %
Platelets: 634 10*3/uL — ABNORMAL HIGH (ref 150–400)
RBC: 2.87 MIL/uL — ABNORMAL LOW (ref 3.87–5.11)
RDW: 14.2 % (ref 11.5–15.5)
WBC: 15.9 10*3/uL — ABNORMAL HIGH (ref 4.0–10.5)
nRBC: 0 % (ref 0.0–0.2)

## 2019-02-09 LAB — AEROBIC/ANAEROBIC CULTURE W GRAM STAIN (SURGICAL/DEEP WOUND)

## 2019-02-09 LAB — GLUCOSE, CAPILLARY
Glucose-Capillary: 246 mg/dL — ABNORMAL HIGH (ref 70–99)
Glucose-Capillary: 223 mg/dL — ABNORMAL HIGH (ref 70–99)
Glucose-Capillary: 149 mg/dL — ABNORMAL HIGH (ref 70–99)
Glucose-Capillary: 164 mg/dL — ABNORMAL HIGH (ref 70–99)

## 2019-02-09 LAB — COMPREHENSIVE METABOLIC PANEL
ALT: 11 U/L (ref 0–44)
AST: 14 U/L — ABNORMAL LOW (ref 15–41)
Albumin: 1.4 g/dL — ABNORMAL LOW (ref 3.5–5.0)
Alkaline Phosphatase: 261 U/L — ABNORMAL HIGH (ref 38–126)
Anion gap: 8 (ref 5–15)
BUN: 20 mg/dL (ref 6–20)
CO2: 24 mmol/L (ref 22–32)
Calcium: 7.7 mg/dL — ABNORMAL LOW (ref 8.9–10.3)
Chloride: 101 mmol/L (ref 98–111)
Creatinine, Ser: 1.07 mg/dL — ABNORMAL HIGH (ref 0.44–1.00)
GFR calc Af Amer: >60 mL/min (ref >60)
GFR calc non Af Amer: >60 mL/min (ref >60)
Glucose, Bld: 168 mg/dL — ABNORMAL HIGH (ref 70–99)
Potassium: 4.6 mmol/L (ref 3.5–5.1)
Sodium: 133 mmol/L — ABNORMAL LOW (ref 135–145)
Total Bilirubin: 0.2 mg/dL — ABNORMAL LOW (ref 0.3–1.2)
Total Protein: 7 g/dL (ref 6.5–8.1)

## 2019-02-09 MED ORDER — INSULIN GLARGINE 100 UNIT/ML ~~LOC~~ SOLN
25.0000 [IU] | Freq: Every day | SUBCUTANEOUS | Status: DC
  Administered 2019-02-10 – 2019-02-19 (×10): 25 [IU] via SUBCUTANEOUS
  Filled 2019-02-09 (×11): qty 0.25

## 2019-02-09 MED ORDER — METHOCARBAMOL 500 MG PO TABS: 500.0000 mg | ORAL_TABLET | Freq: Four times a day (QID) | ORAL | Status: DC | PRN

## 2019-02-09 MED ORDER — INSULIN ASPART 100 UNIT/ML ~~LOC~~ SOLN
0.0000 [IU] | Freq: Three times a day (TID) | SUBCUTANEOUS | Status: DC
  Administered 2019-02-09 – 2019-02-10 (×2): 3 [IU] via SUBCUTANEOUS
  Administered 2019-02-10: 4 [IU] via SUBCUTANEOUS
  Administered 2019-02-10 – 2019-02-17 (×12): 3 [IU] via SUBCUTANEOUS
  Administered 2019-02-17: 18:00:00 4 [IU] via SUBCUTANEOUS
  Administered 2019-02-18: 08:00:00 3 [IU] via SUBCUTANEOUS

## 2019-02-09 MED ORDER — ACETAMINOPHEN 325 MG PO TABS
325.0000 mg | ORAL_TABLET | ORAL | Status: DC | PRN
  Administered 2019-02-11 – 2019-02-18 (×11): 650 mg via ORAL
  Filled 2019-02-09 (×13): qty 2

## 2019-02-09 MED ORDER — GUAIFENESIN-DM 100-10 MG/5ML PO SYRP: 5.0000 mL | ORAL_SOLUTION | Freq: Four times a day (QID) | ORAL | Status: DC | PRN

## 2019-02-09 MED ORDER — EPINEPHRINE 0.3 MG/0.3ML IJ SOAJ
0.3000 mg | Freq: Once | INTRAMUSCULAR | Status: DC | PRN
  Filled 2019-02-09: qty 0.6

## 2019-02-09 MED ORDER — TRAZODONE HCL 50 MG PO TABS
25.0000 mg | ORAL_TABLET | Freq: Every evening | ORAL | Status: DC | PRN
  Administered 2019-02-12 – 2019-02-18 (×5): 50 mg via ORAL
  Filled 2019-02-09 (×7): qty 1

## 2019-02-09 MED ORDER — TRAMADOL HCL 50 MG PO TABS
50.0000 mg | ORAL_TABLET | Freq: Four times a day (QID) | ORAL | Status: DC | PRN
  Administered 2019-02-11 – 2019-02-19 (×11): 50 mg via ORAL
  Filled 2019-02-09 (×13): qty 1

## 2019-02-09 MED ORDER — ENOXAPARIN SODIUM 40 MG/0.4ML ~~LOC~~ SOLN: 40.0000 mg | SUBCUTANEOUS | Status: DC

## 2019-02-09 MED ORDER — ONDANSETRON HCL 4 MG/2ML IJ SOLN: 4.0000 mg | Freq: Four times a day (QID) | INTRAMUSCULAR | Status: DC | PRN

## 2019-02-09 MED ORDER — PANTOPRAZOLE SODIUM 40 MG PO TBEC
40.0000 mg | DELAYED_RELEASE_TABLET | Freq: Every day | ORAL | Status: DC
  Administered 2019-02-10 – 2019-02-19 (×10): 40 mg via ORAL
  Filled 2019-02-09 (×10): qty 1

## 2019-02-09 MED ORDER — POLYETHYLENE GLYCOL 3350 17 G PO PACK
17.0000 g | PACK | Freq: Every day | ORAL | Status: DC | PRN
  Filled 2019-02-09: qty 1

## 2019-02-09 MED ORDER — PROCHLORPERAZINE 25 MG RE SUPP: 12.5000 mg | Freq: Four times a day (QID) | RECTAL | Status: DC | PRN

## 2019-02-09 MED ORDER — METHOCARBAMOL 500 MG PO TABS
500.0000 mg | ORAL_TABLET | Freq: Four times a day (QID) | ORAL | Status: DC | PRN
  Administered 2019-02-11 – 2019-02-18 (×8): 500 mg via ORAL
  Filled 2019-02-09 (×8): qty 1

## 2019-02-09 MED ORDER — MAGNESIUM SULFATE IN D5W 1-5 GM/100ML-% IV SOLN
1.0000 g | Freq: Once | INTRAVENOUS | Status: AC
  Administered 2019-02-09: 11:00:00 1 g via INTRAVENOUS
  Filled 2019-02-09: qty 100

## 2019-02-09 MED ORDER — DIPHENHYDRAMINE HCL 12.5 MG/5ML PO ELIX: 12.5000 mg | ORAL_SOLUTION | Freq: Four times a day (QID) | ORAL | Status: DC | PRN

## 2019-02-09 MED ORDER — ENSURE MAX PROTEIN PO LIQD
11.0000 [oz_av] | Freq: Every day | ORAL | Status: DC
  Administered 2019-02-11 (×2): 11 [oz_av] via ORAL
  Filled 2019-02-09 (×3): qty 330

## 2019-02-09 MED ORDER — ALUM & MAG HYDROXIDE-SIMETH 200-200-20 MG/5ML PO SUSP: 30.0000 mL | ORAL | Status: DC | PRN

## 2019-02-09 MED ORDER — OXYCODONE HCL 5 MG PO TABS
10.0000 mg | ORAL_TABLET | ORAL | Status: DC | PRN
  Administered 2019-02-09 – 2019-02-16 (×13): 10 mg via ORAL
  Filled 2019-02-09 (×4): qty 2
  Filled 2019-02-09: qty 3
  Filled 2019-02-09 (×6): qty 2
  Filled 2019-02-09: qty 3
  Filled 2019-02-09: qty 2

## 2019-02-09 MED ORDER — FLEET ENEMA 7-19 GM/118ML RE ENEM: 1.0000 | ENEMA | Freq: Once | RECTAL | Status: DC | PRN

## 2019-02-09 MED ORDER — LIVING WELL WITH DIABETES BOOK
Freq: Once | Status: AC
  Administered 2019-02-09: 19:00:00
  Filled 2019-02-09: qty 1

## 2019-02-09 MED ORDER — SODIUM CHLORIDE 0.9 % IV SOLN
2.0000 g | INTRAVENOUS | Status: DC
  Administered 2019-02-11 – 2019-02-19 (×9): 2 g via INTRAVENOUS
  Filled 2019-02-09 (×5): qty 2
  Filled 2019-02-09: qty 20
  Filled 2019-02-09 (×4): qty 2

## 2019-02-09 MED ORDER — PRO-STAT SUGAR FREE PO LIQD
30.0000 mL | Freq: Two times a day (BID) | ORAL | Status: DC
  Administered 2019-02-09 – 2019-02-19 (×20): 30 mL via ORAL
  Filled 2019-02-09 (×20): qty 30

## 2019-02-09 MED ORDER — BISACODYL 10 MG RE SUPP: 10.0000 mg | Freq: Every day | RECTAL | Status: DC | PRN

## 2019-02-09 MED ORDER — METOCLOPRAMIDE HCL 5 MG PO TABS
5.0000 mg | ORAL_TABLET | Freq: Three times a day (TID) | ORAL | Status: DC
  Administered 2019-02-09 – 2019-02-11 (×8): 5 mg via ORAL
  Filled 2019-02-09 (×8): qty 1

## 2019-02-09 MED ORDER — ONDANSETRON HCL 4 MG PO TABS: 4.0000 mg | ORAL_TABLET | Freq: Four times a day (QID) | ORAL | Status: DC | PRN

## 2019-02-09 MED ORDER — METHOCARBAMOL 1000 MG/10ML IJ SOLN
500.0000 mg | Freq: Four times a day (QID) | INTRAVENOUS | Status: DC | PRN
  Filled 2019-02-09: qty 5

## 2019-02-09 MED ORDER — ENOXAPARIN SODIUM 80 MG/0.8ML ~~LOC~~ SOLN
0.5000 mg/kg | SUBCUTANEOUS | Status: DC
  Administered 2019-02-09 – 2019-02-14 (×6): 65 mg via SUBCUTANEOUS
  Filled 2019-02-09 (×6): qty 0.65

## 2019-02-09 MED ORDER — FOLIC ACID 1 MG PO TABS
1.0000 mg | ORAL_TABLET | Freq: Every day | ORAL | Status: DC
  Administered 2019-02-09 – 2019-02-19 (×11): 1 mg via ORAL
  Filled 2019-02-09 (×11): qty 1

## 2019-02-09 MED ORDER — PROCHLORPERAZINE EDISYLATE 10 MG/2ML IJ SOLN: 5.0000 mg | Freq: Four times a day (QID) | INTRAMUSCULAR | Status: DC | PRN

## 2019-02-09 MED ORDER — INSULIN ASPART 100 UNIT/ML ~~LOC~~ SOLN: 0.0000 [IU] | Freq: Every day | SUBCUTANEOUS | Status: DC

## 2019-02-09 MED ORDER — PROCHLORPERAZINE MALEATE 5 MG PO TABS: 5.0000 mg | ORAL_TABLET | Freq: Four times a day (QID) | ORAL | Status: DC | PRN

## 2019-02-09 MED ORDER — GABAPENTIN 100 MG PO CAPS
100.0000 mg | ORAL_CAPSULE | Freq: Two times a day (BID) | ORAL | Status: DC
  Administered 2019-02-09 – 2019-02-19 (×20): 100 mg via ORAL
  Filled 2019-02-09 (×20): qty 1

## 2019-02-09 NOTE — Progress Notes (Signed)
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 PMH includes DM not medically managed, gastroparesis, obesity, R nonhealing foot wound with I&D 01/03/19, HTN, migraines.    PT Comments    Pt tolerated treatment well despite experiencing controlled fall during session as noted in ambulation section of note below. Pt with increased tolerance for OOB activity and improved step length during session, with one instance of L knee buckling contributing to fall. Pt remains at a high falls risk due to strength, power, balance, functional mobility, gait, and endurance deficits. PT continues to recommend CIR upon discharge.  Follow Up Recommendations  CIR;Supervision/Assistance - 24 hour     Equipment Recommendations  Rolling walker with 5" wheels;Wheelchair (measurements PT);Wheelchair cushion (measurements PT);3in1 (PT)    Recommendations for Other Services       Precautions / Restrictions Precautions Precautions: Fall Precaution Comments: wound VAC Required Braces or Orthoses: Other Brace Other Brace: R residual limb protector(Limb protector not utilized as too small for residual limb) Restrictions Weight Bearing Restrictions: Yes RLE Weight Bearing: Non weight bearing    Mobility  Bed Mobility Overal bed mobility: Modified Independent Bed Mobility: Supine to Sit     Supine to sit: Modified independent (Device/Increase time)     General bed mobility comments: pt modI with use of bed rail and increased time  Transfers Overall transfer level: Needs assistance Equipment used: Rolling walker (2 wheeled) Transfers: Sit to/from Stand Sit to Stand: Min guard         General transfer comment: pt min  guard with use of RW from bed, recliner, and chair without armrests  Ambulation/Gait Ambulation/Gait assistance: Min guard(intermittent periods of close supervision) Gait Distance (Feet): 10 Feet(2 trials of 7' prior to 10') Assistive device: Rolling walker (2 wheeled) Gait Pattern/deviations: (hop to pattern) Gait velocity: reduced Gait velocity interpretation: <1.31 ft/sec, indicative of household ambulator General Gait Details: hop to gait with short step length, progressing to increased step length after PT verbal cues to do so. Pt with one standing rest break during initial ambulation trial. Pt with controlled fall during final bout of ambulation, as pt experienced L knee buckling during hop to gait. PT assisted pt with use of gait belt down to PT's knee where pt was able to sit temporarily. Pt and PT attempted to stand from this position however pt unable to achieve standing and returned back to PT knee. Pt then slowly assisted to floor from PT knee ~1 ft off ground. Pt reports no significant harm other than mild soreness to residual limb.   Stairs             Wheelchair Mobility    Modified Rankin (Stroke Patients Only)       Balance Overall balance assessment: Needs assistance Sitting-balance support: Bilateral upper extremity supported Sitting balance-Leahy Scale: Fair     Standing balance support: Bilateral upper extremity supported Standing balance-Leahy Scale: Poor                              Cognition Arousal/Alertness: Awake/alert Behavior During Therapy: Anxious Overall Cognitive Status: Within Functional Limits for tasks assessed  General Comments: pt with controlled fall during session and tearful/anxious after event. PT provides reassurance to calm patient      Exercises      General Comments General comments (skin integrity, edema, etc.): RN checked residual limb s/p fall, no damage to wound  vac noted, residual limb appears intact.      Pertinent Vitals/Pain Pain Assessment: Faces Faces Pain Scale: Hurts a little bit Pain Location: R residual limb Pain Descriptors / Indicators: Sore Pain Intervention(s): Limited activity within patient's tolerance;Relaxation    Home Living                      Prior Function            PT Goals (current goals can now be found in the care plan section) Acute Rehab PT Goals Patient Stated Goal: return to independence Progress towards PT goals: Progressing toward goals    Frequency    Min 5X/week      PT Plan Current plan remains appropriate    Co-evaluation              AM-PAC PT "6 Clicks" Mobility   Outcome Measure  Help needed turning from your back to your side while in a flat bed without using bedrails?: None Help needed moving from lying on your back to sitting on the side of a flat bed without using bedrails?: None Help needed moving to and from a bed to a chair (including a wheelchair)?: A Little Help needed standing up from a chair using your arms (e.g., wheelchair or bedside chair)?: A Little Help needed to walk in hospital room?: A Lot Help needed climbing 3-5 steps with a railing? : A Lot 6 Click Score: 18    End of Session Equipment Utilized During Treatment: Gait belt Activity Tolerance: Patient tolerated treatment well Patient left: in chair;with call bell/phone within reach;with chair alarm set Nurse Communication: Mobility status;Other (comment)(post fall huddle complete) PT Visit Diagnosis: Other abnormalities of gait and mobility (R26.89);Muscle weakness (generalized) (M62.81);Pain Pain - Right/Left: Right Pain - part of body: Leg     Time: [id number] PT Time Calculation (min) (ACUTE ONLY): 40 min  Charges:  $Gait Training: 8-22 mins $Therapeutic Activity: 8-22 mins                     Zenaida Niece, PT, DPT Acute Rehabilitation Pager: [phone]    Zenaida Niece 02/09/2019,  10:10 AM

## 2019-02-09 NOTE — Progress Notes (Signed)
Inpatient Rehabilitation-Admissions Coordinator   I have medical clearance for admit to CIR today from Dr. Eliseo Squires. Pt would still like to admit to CIR. RN, CM/SW updated on plan for admit today.   Please call if questions.   Jhonnie Garner, OTR/L  Rehab Admissions Coordinator  [phone] 02/09/2019 11:04 AM

## 2019-02-09 NOTE — Progress Notes (Signed)
At approximately 0910 patient was being transferred to chair per physical therapy and her left knee buckled and she slid down therapists leg to the floor on her buttocks. Patient able to move all extremities without difficulty. No acute distress noted. No complaints of pain voiced except the right bka she previously had done. Patient assessed and no injuries apparent at this time. Patient assisted to chair per myself and other staff and therapist. Patient tolerated well at this time. Will continue to observe.

## 2019-02-09 NOTE — Discharge Summary (Signed)
Physician Discharge Summary  Sheila Austin:[id number] DOB: 01-24-1986 DOA: 01/31/2019  PCP: Maximiano Coss, NP  Admit date: 01/31/2019 Discharge date: 02/09/2019  Admitted From: Home  Discharge disposition: CIR on 02/09/19   Recommendations for Outpatient Follow-Up:   To CIR Continue with ceftriaxone for antibiotic therapy, until October 28 Prn CBC/BMP and Mg Outpatient follow up for intraabdominal lymphadenopathy   Discharge Diagnosis:   Principal Problem:   Diabetic foot ulcer with osteomyelitis ([place]) Active Problems:   Diabetes mellitus out of control ([place])   Essential hypertension, benign   Gastroparesis   BMI 45.0-49.9, adult ([place])   Adjustment disorder   Sepsis due to cellulitis ([place])   Skin ulcer of right foot with necrosis of muscle ([facility])   Severe protein-calorie malnutrition ([place])   Abscess of leg, right   Sepsis with acute organ dysfunction without septic shock ([place])   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[place])   Streptococcal bacteremia   Klebsiella pneumoniae infection    Discharge Condition: Improved.  Diet recommendation:   Carbohydrate-modified.  Wound care: Continue at rehab  Code status: Full.   History of Present Illness:   Patient is a 33 y.o.femalewith medical history significant ofmorbid obesity (BMI 45); uncontrolled DM; h/o E coli sepsis; and HTN, non compliance presented to the hospital with diabetic foot infection. Her initial visit about this issue was 8/24, and she was admitted to the hospital 8/26-29. She had debridement on 8/29 and then left the hospital AMA. She was last seen for this issue by Dr. Scot Dock on 9/23. She had good arterial dopplers and was given Keflex. She came in this admission because of "very harsh nausea and vomiting" x 4 days. +fever yesterday intermittently, up to 99.9. She has a wound on her foot - it started in July as a blister. She got in a river and it busted open. She saw Dr. Scot Dock and she had  debridement and she left AMA due to child care issues. 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  Status post below-knee amputation by orthopedic surgery, [place] on 02/04/2019.  Initially received aztreonam Flagyl vancomycin.  Infectious disease was consulted and patient is currently on Rocephin IV.  Orthopedics recommends 4 weeks of IV antibiotics.  Preop culture grew Klebsiella species and patient was bacteremic with strep intermedius bacteremia. Repeat blood culture from 10/ 1 no growth so far.  Operative culture no growth in 24 hours.   Please follow infectious disease recommendations on duration of antibiotic.  Patient was last seen by Dr. Tommy Medal on 02/06/2019.  Patient will need to follow-up with orthopedic as outpatient within 1 week.    Nausea vomiting likely due to gastroparesis.   Improved.  Continue antiemetics, as needed  Blood culture with Streptococcus intermedius 9/27,  .  Currently on Rocephin. -plan for 4 weeks  Hyponatremia .  Mild.   - Will need to monitor periodically.  Anemia likely from chronic disease.  Serum iron of 8 with TIBC as 109.  Ferritin of 495 -given 1 unit PRBCs  CKD stage IIIl: Baseline creatinine 1.5-1.9.  Latest creatinine of 1.05.  Has remained stable  Essential hypertension: Not on any medication, stable.   Diabetic foot ulcer with osteomyelitis with uncontrolled diabetes.  Latest hemoglobin A1c  10.8. Cont Lantus, sliding scale, diabetic diet and closely monitor.  Relatively controlled blood glucose levels at this time.  BMI 45.0-49.9, adult: Would benefit from weight reduction.  Adjustment disorder-stable  Intra-abdominal  lymphadenopathy seen incidentally on the CT scan with splenomegaly with multiple scattered enlarged retroperitoneal and right iliac and right inguinal lymph nodes.  Likely reactive in the setting of infection - will need follow-up as outpatient., possible  biopsy to rule out myeloproliferative disorder if not resolved with antibiotics and infection treatment  Nutrition: Nutrition Problem: Increased nutrient needs Etiology: wound healing Signs/Symptoms: estimated needs Interventions: Prostat, Glucerna shake  Disposition Plan:  CIR on 10/5  Medical Consultants:   Orthopedics ID   Discharge Exam:   Vitals:   02/09/19 0907 02/09/19 0914  BP: (!) 157/91 (!) 153/95  Pulse: (!) 131 (!) 122  Resp: 19 16  Temp: 99 F (37.2 C) 98.4 F (36.9 C)  SpO2: 99% 100%   Vitals:   02/09/19 0406 02/09/19 0747 02/09/19 0907 02/09/19 0914  BP: 137/80 128/85 (!) 157/91 (!) 153/95  Pulse: (!) 106 (!) 102 (!) 131 (!) 122  Resp:  16 19 16   Temp: 98.3 F (36.8 C) 98.6 F (37 C) 99 F (37.2 C) 98.4 F (36.9 C)  TempSrc: Oral Oral Oral Oral  SpO2: 100% 98% 99% 100%  Weight:      Height:        General: Obese, not in obvious distress-- working with PT  Procedures:    Right transtibial amputation on 02/04/2019 by orthopedics Dr. Sharol Given  The results of significant diagnostics from this hospitalization (including imaging, microbiology, ancillary and laboratory) are listed below for reference.     Diagnostic Studies:   Ct Abdomen Pelvis Wo Contrast  Result Date: 02/01/2019 CLINICAL DATA:  Initial evaluation for acute nausea, vomiting. EXAM: CT ABDOMEN AND PELVIS WITHOUT CONTRAST TECHNIQUE: Multidetector CT imaging of the abdomen and pelvis was performed following the standard protocol without IV contrast. COMPARISON:  Prior CT from 11/25/2014. FINDINGS: Lower chest: Visualized lung bases are clear. Hepatobiliary: Liver demonstrates a normal unenhanced appearance. Gallbladder within normal  limits. No biliary dilatation. Pancreas: Pancreas within normal limits. Spleen: Spleen enlarged measuring 16 cm in AP diameter, increased from previous. Adrenals/Urinary Tract: Adrenal glands within normal limits. Kidneys equal in size without evidence for nephrolithiasis or hydronephrosis. No ureterolithiasis or hydroureter. Partially distended bladder within normal limits. Stomach/Bowel: Stomach within normal limits. No evidence for bowel obstruction. Normal appendix. No acute inflammatory changes seen about the bowels. Vascular/Lymphatic: Intra-abdominal aorta of normal caliber. Mildly enlarged left periaortic lymph node measures 14 mm (series 3, image 42). Multiple additional scattered subcentimeter shotty retroperitoneal nodes noted. In the pelvis, there is an enlarged right common iliac node measuring 11 mm (series 3, image 67). Right external iliac nodes measure up to 16 mm (series 3, image 84). Right inguinal nodes measure up to 19 mm (series 3, image 103). Findings are of uncertain etiology, but new from previous. Reproductive: Uterus and ovaries within normal limits. Other: No free air or fluid. Musculoskeletal: Small focus of soft tissue density within the subcutaneous fat of the right anterior abdomen likely reflects an injection site. No acute osseous abnormality. No discrete lytic or blastic osseous lesions. IMPRESSION: 1. Interval development of splenomegaly with multiple scattered enlarged retroperitoneal, right iliac, and right inguinal lymph nodes as above, indeterminate. While these findings may be reactive in nature, possible lymphoproliferative disorder could also have this appearance. Correlation with laboratory values with possible histologic sampling suggested for  further evaluation. 2. No other acute intra-abdominal or pelvic process. Electronically Signed   By: Jeannine Boga M.D.   On: 02/01/2019 05:06   Dg Chest 1 View  Result Date: 02/01/2019 CLINICAL DATA:  Initial evaluation  for wound to right foot, history of recent surgery. EXAM: CHEST  1 VIEW COMPARISON:  Prior radiograph from 06/26/2017. FINDINGS: The cardiac and mediastinal silhouettes are stable in size and contour, and remain within normal limits. The lungs are normally inflated. No airspace consolidation, pleural effusion, or pulmonary edema. No pneumothorax. No acute osseous abnormality. IMPRESSION: No active disease. Electronically Signed   By: Jeannine Boga M.D.   On: 02/01/2019 02:24   Dg Ankle Complete Right  Result Date: 02/01/2019 CLINICAL DATA:  Wound lateral right foot. Ulcer. History of recent foot surgery. EXAM: RIGHT ANKLE - COMPLETE 3+ VIEW COMPARISON:  None. FINDINGS: Soft tissue defect about the lateral aspect of the fifth metatarsal, better assessed on concurrent foot exam. Generalized soft tissue edema about the ankle, possible patchy soft tissue air laterally. Periosteal thickening of the proximal fifth metatarsal better assessed on foot radiograph. No bony destructive change about the ankle. The ankle mortise is preserved. No fracture or dislocation. IMPRESSION: 1. Soft tissue defect about the lateral aspect of the fifth metatarsal, better assessed on concurrent foot exam. 2. Generalized soft tissue about the ankle with patchy air in the soft tissues laterally, may be related to recent surgery. Electronically Signed   By: Keith Rake M.D.   On: 02/01/2019 02:46   Mr Foot Right W Wo Contrast  Result Date: 02/01/2019 CLINICAL DATA:  Diabetic wound along the lateral aspect of the foot. Diffuse soft tissue swelling. EXAM: MRI OF THE RIGHT FOREFOOT WITHOUT AND WITH CONTRAST TECHNIQUE: Multiplanar, multisequence MR imaging of the right foot was performed before and after the administration of intravenous contrast. CONTRAST:  33m GADAVIST GADOBUTROL 1 MMOL/ML IV SOLN COMPARISON:  Radiographs 02/01/2019 FINDINGS: There is a large open wound along the lateral plantar aspect of the foot near the  base of the fifth metatarsal. There is severe diffuse subcutaneous soft tissue swelling/edema/fluid suggesting severe cellulitis. There is also scattered areas of gas throughout the soft tissues most notably along the lateral aspect of the hindfoot adjacent to the calcaneus and cuboid. This could be air related to recent surgery or from the open wound but gas-forming bacteria infection is also possible. Severe diffuse myofasciitis throughout the foot with some scattered gas noted in the short flexor muscles. Findings consistent with pyomyositis. This most notably involves the abductor digiti minimi muscle. Diffuse abnormal signal intensity and enhancement in the fifth metatarsal, cuboid and calcaneus consistent with osteomyelitis. There is also likely involvement of the lateral cuneiform and base of the fourth metatarsal. Complex tibiotalar joint effusion. I do not see any obvious destructive bony changes but there is moderate synovial enhancement and findings are certainly worrisome for septic arthritis. I do not see any definite findings for septic arthritis involving the subtalar joints. The sinus tarsi appears normal. IMPRESSION: 1. Severe diffuse cellulitis and myofasciitis. 2. Large open wound along the lateral aspect of the foot communicating with several rim enhancing fluid collections, likely dissecting abscesses. Some of these contain gas and this is most notable around the lateral aspect of the cuboid and calcaneus. 3. Pyomyositis involving the short flexor muscles of the foot. 4. Osteomyelitis involving the fifth metatarsal, the cuboid and calcaneus. I think is also involvement of the lateral cuneiform and base of the fourth metatarsal. 5. Complex  tibiotalar joint effusion.  Septic arthritis is likely. 6. Septic tenosynovitis involving the peroneal tendons. Electronically Signed   By: Marijo Sanes M.D.   On: 02/01/2019 14:46   Dg Foot Complete Right  Result Date: 02/01/2019 CLINICAL DATA:  Wound  lateral right foot. Ulcer. Recent foot surgery. EXAM: RIGHT FOOT COMPLETE - 3+ VIEW COMPARISON:  None. FINDINGS: Soft tissue defect about the lateral foot adjacent to the proximal fifth metatarsal. There is periosteal reaction at the fifth metatarsal base. No other bony destructive or periosteal change. No acute fracture. Diffuse generalized soft tissue edema about the foot and ankle. IMPRESSION: 1. Soft tissue defect about the lateral foot with subjacent periosteal reaction at the base of the fifth metatarsal, suspicious for osteomyelitis. 2. Diffuse soft tissue edema about the foot and ankle. Electronically Signed   By: Keith Rake M.D.   On: 02/01/2019 02:47     Labs:   Basic Metabolic Panel: Recent Labs  Lab 02/03/19 0546 02/04/19 0229 02/05/19 0212 02/06/19 0418 02/07/19 0617  NA 133* 130* 131* 133* 133*  K 3.3* 3.7 3.5 3.6 3.7  CL 98 99 99 101 103  CO2 25 23 22 23 22   GLUCOSE 182* 114* 148* 165* 199*  BUN 21* 23* 21* 18 20  CREATININE 1.13* 1.09* 1.02* 1.05* 1.00  CALCIUM 7.2* 6.9* 6.9* 6.8* 6.9*  MG  --   --   --  1.5* 1.6*   GFR Estimated Creatinine Clearance: 96.6 mL/min (by C-G formula based on SCr of 1 mg/dL). Liver Function Tests: No results for input(s): AST, ALT, ALKPHOS, BILITOT, PROT, ALBUMIN in the last 168 hours. No results for input(s): LIPASE, AMYLASE in the last 168 hours. No results for input(s): AMMONIA in the last 168 hours. Coagulation profile No results for input(s): INR, PROTIME in the last 168 hours.  CBC: Recent Labs  Lab 02/03/19 0546  02/04/19 0229 02/05/19 0212 02/06/19 0418 02/07/19 0617 02/08/19 1104  WBC 26.5*  --  26.8* 21.7* 16.5* 14.4*  --   HGB 7.4*   < > 7.9* 7.2* 7.0* 6.6* 7.7*  HCT 23.2*   < > 25.3* 23.5* 22.9* 21.3* 24.7*  MCV 86.2  --  86.1 86.7 86.4 86.2  --   PLT 380  --  411* 441* 475* 419*  --    < > = values in this interval not displayed.   Cardiac Enzymes: No results for input(s): CKTOTAL, CKMB, CKMBINDEX,  TROPONINI in the last 168 hours. BNP: Invalid input(s): POCBNP CBG: Recent Labs  Lab 02/08/19 0631 02/08/19 1148 02/08/19 1625 02/08/19 2115 02/09/19 0636  GLUCAP 206* 174* 193* 142* 246*   D-Dimer No results for input(s): DDIMER in the last 72 hours. Hgb A1c No results for input(s): HGBA1C in the last 72 hours. Lipid Profile No results for input(s): CHOL, HDL, LDLCALC, TRIG, CHOLHDL, LDLDIRECT in the last 72 hours. Thyroid function studies No results for input(s): TSH, T4TOTAL, T3FREE, THYROIDAB in the last 72 hours.  Invalid input(s): FREET3 Anemia work up No results for input(s): VITAMINB12, FOLATE, FERRITIN, TIBC, IRON, RETICCTPCT in the last 72 hours. Microbiology Recent Results (from the past 240 hour(s))  Blood culture (routine x 2)     Status: None   Collection Time: 02/01/19 12:19 AM   Specimen: BLOOD  Result Value Ref Range Status   Specimen Description BLOOD LEFT HAND  Final   Special Requests   Final    BOTTLES DRAWN AEROBIC ONLY Blood Culture adequate volume   Culture   Final  NO GROWTH 5 DAYS Performed at [facility], [place] [address] [place], [place] [address]    Report Status 02/06/2019 FINAL  Final  Urine culture     Status: Abnormal   Collection Time: 02/01/19 12:25 AM   Specimen: Urine, Random  Result Value Ref Range Status   Specimen Description URINE, RANDOM  Final   Special Requests   Final    NONE Performed at [facility], [place] [address] [place], [place] [address]    Culture MULTIPLE SPECIES PRESENT, SUGGEST RECOLLECTION (A)  Final   Report Status 02/02/2019 FINAL  Final  Blood culture (routine x 2)     Status: Abnormal   Collection Time: 02/01/19 12:34 AM   Specimen: BLOOD  Result Value Ref Range Status   Specimen Description BLOOD RIGHT ARM  Final   Special Requests   Final    BOTTLES DRAWN AEROBIC AND ANAEROBIC Blood Culture results may not be optimal due to an excessive volume of blood received in culture bottles    Culture  Setup Time   Final    GRAM POSITIVE COCCI ANAEROBIC BOTTLE ONLY CRITICAL RESULT CALLED TO, READ BACK BY AND VERIFIED WITHBronwen [facility]ters [facility] 2154 02/03/19 A BROWNING Performed at [facility], [place] [address], [place],  [address]    Culture STREPTOCOCCUS INTERMEDIUS (A)  Final   Report Status 02/05/2019 FINAL  Final   Organism ID, Bacteria STREPTOCOCCUS INTERMEDIUS  Final      Susceptibility   Streptococcus intermedius - MIC*    PENICILLIN <=0.06 SENSITIVE Sensitive     CEFTRIAXONE 0.25 SENSITIVE Sensitive     ERYTHROMYCIN >=8 RESISTANT Resistant     LEVOFLOXACIN 0.5 SENSITIVE Sensitive     VANCOMYCIN 0.5 SENSITIVE Sensitive     * STREPTOCOCCUS INTERMEDIUS  Blood Culture ID Panel (Reflexed)     Status: Abnormal   Collection Time: 02/01/19 12:34 AM  Result Value Ref Range Status   Enterococcus species NOT DETECTED NOT DETECTED Final   Listeria monocytogenes NOT DETECTED NOT DETECTED Final   Staphylococcus species NOT DETECTED NOT DETECTED Final   Staphylococcus aureus (BCID) NOT DETECTED NOT DETECTED Final   Streptococcus species DETECTED (A) NOT DETECTED Final    Comment: Not Enterococcus species, Streptococcus agalactiae, Streptococcus pyogenes, or Streptococcus pneumoniae. CRITICAL RESULT CALLED TO, READ BACK BY AND VERIFIED WITH: L CURRAN PHARMD 2154 02/03/19 A BROWNING    Streptococcus agalactiae NOT DETECTED NOT DETECTED Final   Streptococcus pneumoniae NOT DETECTED NOT DETECTED Final   Streptococcus pyogenes NOT DETECTED NOT DETECTED Final   Acinetobacter baumannii NOT DETECTED NOT DETECTED Final   Enterobacteriaceae species NOT DETECTED NOT DETECTED Final   Enterobacter cloacae complex NOT DETECTED NOT DETECTED Final   Escherichia coli NOT DETECTED NOT DETECTED Final   Klebsiella oxytoca NOT DETECTED NOT DETECTED Final   Klebsiella pneumoniae NOT DETECTED NOT DETECTED Final   Proteus species NOT DETECTED NOT DETECTED Final   Serratia marcescens NOT  DETECTED NOT DETECTED Final   Haemophilus influenzae NOT DETECTED NOT DETECTED Final   Neisseria meningitidis NOT DETECTED NOT DETECTED Final   Pseudomonas aeruginosa NOT DETECTED NOT DETECTED Final   Candida albicans NOT DETECTED NOT DETECTED Final   Candida glabrata NOT DETECTED NOT DETECTED Final   Candida krusei NOT DETECTED NOT DETECTED Final   Candida parapsilosis NOT DETECTED NOT DETECTED Final   Candida tropicalis NOT DETECTED NOT DETECTED Final    Comment: Performed at [facility], [place],  [place] [address]  SARS Coronavirus 2 [facility] order, Performed in [facility] hospital lab) Nasopharyngeal Nasopharyngeal Swab     Status: None   Collection Time: 02/01/19  3:50 AM   Specimen: Nasopharyngeal Swab  Result Value Ref Range Status   SARS Coronavirus 2 NEGATIVE NEGATIVE Final    Comment: (NOTE) If result is NEGATIVE SARS-CoV-2 target nucleic acids are NOT DETECTED. The SARS-CoV-2 RNA is generally detectable in upper and lower  respiratory specimens during the acute phase of infection. The lowest  concentration of SARS-CoV-2 viral copies this assay can detect is 250  copies / mL. A negative result does not preclude SARS-CoV-2 infection  and should not be used as the sole basis for treatment or other  patient management decisions.  A negative result may occur with  improper specimen collection / handling, submission of specimen other  than nasopharyngeal swab, presence of viral mutation(s) within the  areas targeted by this assay, and inadequate number of viral copies  (<250 copies / mL). A negative result must be combined with clinical  observations, patient history, and epidemiological information. If result is POSITIVE SARS-CoV-2 target nucleic acids are DETECTED. The SARS-CoV-2 RNA is generally detectable in upper and lower  respiratory specimens dur ing the acute phase of infection.  Positive  results are indicative of active infection with  SARS-CoV-2.  Clinical  correlation with patient history and other diagnostic information is  necessary to determine patient infection status.  Positive results do  not rule out bacterial infection or co-infection with other viruses. If result is PRESUMPTIVE POSTIVE SARS-CoV-2 nucleic acids MAY BE PRESENT.   A presumptive positive result was obtained on the submitted specimen  and confirmed on repeat testing.  While 2019 novel coronavirus  (SARS-CoV-2) nucleic acids may be present in the submitted sample  additional confirmatory testing may be necessary for epidemiological  and / or clinical management purposes  to differentiate between  SARS-CoV-2 and other Sarbecovirus currently known to infect humans.  If clinically indicated additional testing with an alternate test  methodology [phone]) is advised. The SARS-CoV-2 RNA is generally  detectable in upper and lower respiratory sp ecimens during the acute  phase of infection. The expected result is Negative. Fact Sheet for Patients:  StrictlyIdeas.no Fact Sheet for Healthcare Providers: BankingDealers.co.za This test is not yet approved or cleared by the [place] FDA and has been authorized for detection and/or diagnosis of SARS-CoV-2 by FDA under an Emergency Use Authorization (EUA).  This EUA will remain in effect (meaning this test can be used) for the duration of the COVID-19 declaration under Section 564(b)(1) of the Act, 21 U.S.C. section 360bbb-3(b)(1), unless the authorization is terminated or revoked sooner. Performed at [facility], [place] [address], [place], [place] [address]   Aerobic Culture (superficial specimen)     Status: None   Collection Time: 02/01/19 11:55 AM   Specimen: Wound  Result Value Ref Range Status   Specimen Description WOUND RIGHT FOOT  Final   Special Requests NONE  Final   Gram Stain   Final    RARE WBC PRESENT, PREDOMINANTLY PMN ABUNDANT  GRAM POSITIVE COCCI ABUNDANT GRAM NEGATIVE RODS    Culture   Final    RARE KLEBSIELLA PNEUMONIAE RARE DIPHTHEROIDS(CORYNEBACTERIUM SPECIES) Standardized susceptibility testing for this organism is not available. WITH IN MIXED ORGANISMS Performed at [facility], [place] [address] [place] [address] [place], [place] [address]    Report Status 02/05/2019 FINAL  Final   Organism ID, Bacteria KLEBSIELLA  PNEUMONIAE  Final      Susceptibility   Klebsiella pneumoniae - MIC*    AMPICILLIN >=32 RESISTANT Resistant     CEFAZOLIN >=64 RESISTANT Resistant     CEFEPIME <=1 SENSITIVE Sensitive     CEFTAZIDIME <=1 SENSITIVE Sensitive     CEFTRIAXONE <=1 SENSITIVE Sensitive     CIPROFLOXACIN <=0.25 SENSITIVE Sensitive     GENTAMICIN 4 SENSITIVE Sensitive     IMIPENEM 2 SENSITIVE Sensitive     TRIMETH/SULFA <=20 SENSITIVE Sensitive     AMPICILLIN/SULBACTAM 16 INTERMEDIATE Intermediate     PIP/TAZO <=4 SENSITIVE Sensitive     Extended ESBL NEGATIVE Sensitive     * RARE KLEBSIELLA PNEUMONIAE  Surgical pcr screen     Status: None   Collection Time: 02/04/19  2:11 AM   Specimen: Nasal Mucosa; Nasal Swab  Result Value Ref Range Status   MRSA, PCR NEGATIVE NEGATIVE Final   Staphylococcus aureus NEGATIVE NEGATIVE Final    Comment: (NOTE) The Xpert SA Assay (FDA approved for NASAL specimens in patients 48 years of age and older), is one component of a comprehensive surveillance program. It is not intended to diagnose infection nor to guide or monitor treatment. Performed at [facility], [place] [address] [place], [place] [address]   Aerobic/Anaerobic Culture (surgical/deep wound)     Status: None (Preliminary result)   Collection Time: 02/04/19 11:16 AM   Specimen: Soft Tissue, Other  Result Value Ref Range Status   Specimen Description TISSUE  Final   Special Requests NONE  Final   Gram Stain   Final    RARE WBC PRESENT, PREDOMINANTLY PMN NO ORGANISMS SEEN Performed at [facility], [place] [address], [place], [place] [address]    Culture   Final    RARE VIRIDANS STREPTOCOCCUS NO ANAEROBES ISOLATED; CULTURE IN PROGRESS FOR 5 DAYS    Report Status PENDING  Incomplete  Culture, blood (routine x 2)     Status: None (Preliminary result)   Collection Time: 02/05/19 11:41 AM   Specimen: BLOOD  Result Value Ref Range Status   Specimen Description BLOOD RIGHT ANTECUBITAL  Final   Special Requests   Final    BOTTLES DRAWN AEROBIC AND ANAEROBIC Blood Culture results may not be optimal due to an excessive volume of blood received in culture bottles   Culture   Final    NO GROWTH 4 DAYS Performed at [facility], [place] [address] [place], [place] [address]    Report Status PENDING  Incomplete  Culture, blood (routine x 2)     Status: None (Preliminary result)   Collection Time: 02/05/19 11:51 AM   Specimen: BLOOD  Result Value Ref Range Status   Specimen Description BLOOD BLOOD LEFT HAND  Final   Special Requests   Final    BOTTLES DRAWN AEROBIC AND ANAEROBIC Blood Culture adequate volume   Culture   Final    NO GROWTH 4 DAYS Performed at [facility], [place] [address], [place], [place] [address]    Report Status PENDING  Incomplete     Discharge Instructions:   Discharge Instructions    Diet Carb Modified   Complete by: As directed    Discharge instructions   Complete by: As directed    As per CIR   Increase activity slowly   Complete by: As directed    Negative Pressure Wound Therapy - Incisional   Complete by: As directed    Discontinue the hospital  wound VAC pump and attach her dressing to the portable Praveena wound VAC pump.     Allergies as of 02/09/2019      Reactions   Bee Venom Anaphylaxis, Hives, Shortness Of Breath, Swelling   Penicillins Itching   Has patient had a PCN reaction causing immediate rash, facial/tongue/throat swelling, SOB or lightheadedness with hypotension: yes Has patient had a PCN reaction causing severe rash involving mucus  membranes or skin necrosis: unknown Has patient had a PCN reaction that required hospitalization : yes Has patient had a PCN reaction occurring within the last 10 years: no If all of the above answers are "NO", then may proceed with Cephalosporin use.      Medication List    STOP taking these medications   cephALEXin 500 MG capsule Commonly known as: KEFLEX   sulfamethoxazole-trimethoprim 400-80 MG tablet Commonly known as: BACTRIM     TAKE these medications   acetaminophen 325 MG tablet Commonly known as: TYLENOL Take 1-2 tablets (325-650 mg total) by mouth every 6 (six) hours as needed for mild pain (pain score 1-3 or temp > 100.5).   blood glucose meter kit and supplies Dispense based on patient and insurance preference. Use up to four times daily as directed. (FOR ICD-10 E10.9, E11.9).   cefTRIAXone 2 g in sodium chloride 0.9 % 100 mL Inject 2 g into the vein daily.   docusate sodium 100 MG capsule Commonly known as: COLACE Take 1 capsule (100 mg total) by mouth 2 (two) times daily.   escitalopram 10 MG tablet Commonly known as: Lexapro Take 1 tablet (10 mg total) by mouth daily.   feeding supplement (GLUCERNA SHAKE) Liqd Take 237 mLs by mouth daily at 3 pm.   feeding supplement (PRO-STAT SUGAR FREE 64) Liqd Take 30 mLs by mouth 2 (two) times daily.   heparin 5000 UNIT/ML injection Inject 1 mL (5,000 Units total) into the skin every 8 (eight) hours.   insulin aspart 100 UNIT/ML injection Commonly known as: novoLOG Inject 0-20 Units into the skin 3 (three) times daily with meals.   insulin lispro 100 UNIT/ML KwikPen Commonly known as: HumaLOG KwikPen Inject 0.08 mLs (8 Units total) into the skin 3 (three) times daily before meals.   Insulin Pen Needle 31G X 6 MM Misc 1 pen by Does not apply route as directed.   Lantus SoloStar 100 UNIT/ML Solostar Pen Generic drug: Insulin Glargine Inject 25 Units into the skin daily. What changed: how much to take     methocarbamol 500 MG tablet Commonly known as: ROBAXIN Take 1 tablet (500 mg total) by mouth every 6 (six) hours as needed for muscle spasms.   metoCLOPramide 5 MG tablet Commonly known as: REGLAN Take 1-2 tablets (5-10 mg total) by mouth every 8 (eight) hours as needed for nausea (if ondansetron (ZOFRAN) ineffective.).   ondansetron 4 MG tablet Commonly known as: ZOFRAN Take 1 tablet (4 mg total) by mouth every 6 (six) hours as needed for nausea.   Oxycodone HCl 10 MG Tabs Take 1-1.5 tablets (10-15 mg total) by mouth every 4 (four) hours as needed for severe pain (pain score 7-10).   oxyCODONE 5 MG immediate release tablet Commonly known as: Oxy IR/ROXICODONE Take 1-2 tablets (5-10 mg total) by mouth every 4 (four) hours as needed for moderate pain (pain score 4-6).   pantoprazole 40 MG tablet Commonly known as: PROTONIX Take 1 tablet (40 mg total) by mouth daily.      Follow-up Information  Newt Minion, MD In 1 week.   Specialty: Orthopedic Surgery Contact information: [place] [address] [phone]            Time coordinating discharge: 39 minutes  Signed:  Geradine Girt  [facility] 02/09/2019, 9:36 AM

## 2019-02-09 NOTE — Progress Notes (Signed)
Patient admitted to in patient rehab during shift. Denies pain at time, informed of safety plan and unit visitor policy.  Adria Devon, LPN.

## 2019-02-09 NOTE — H&P (Addendum)
Physical Medicine and Rehabilitation Admission H&P    Chief Complaint  Patient presents with  . Emesis    HPI: Sheila Austin is a 33 year old female with history of T2DM,  HTN, medical non-compliance for years, recent depressive episode due to loss of mother, morbid obesity , fatty liver who developed a blister this summer that ruptured with onset of left food ulcer treated with I & D 12/31/18 who left AMA post procedure due to family issues. History taken from chart review and patient. She continued to have poor wound healing despite conservative care and was admitted on  01/31/2019 due to progressive nausea, vomiting, drainage, foot pain, and weakness. She was found to be septic and treated with IVF as well broad spectrum antibiotics. MRI foot showed severe diffuse cellulitis with myofascitis, osteomyelitis of fifth metatarsal, cuboid and calcaneus as well as abscess extending of the peroneal tendon.  Dr. Sharol Given consulted for input and recommended IV antibiotics for another 24 hours followed by amputation.  Blood cultures positive for strep intermedius and wound cultures showed Klebsiella pneumonia.  ID recommended high dose Rocephin x4 weeks.   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 Sharol Given reported that patient had abscesses that extended through anterior and lateral compartment at level of amputation. Limb guard and stump shrinker ordered by [employer].  Fever resolving with repeat blood cultures negative so far.  Plans for PICC line placement on 02/07/2019. Hospital course further complicated by ABLA, with drop in hemoglobin on 10/03 to 6.6 and was transfused with I unit PRBC.  Pain control improving and therapy evaluations completed revealing deficits in mobility as well as ability to carry out ADLs.  CIR recommended due to functional decline. Please see preadmission assessment from earlier today as well.   Review of Systems  Constitutional: Negative for chills and  fever.  HENT: Negative for hearing loss.   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  Respiratory: Negative for cough, hemoptysis and sputum production.   Cardiovascular: Negative for chest pain and palpitations.  Gastrointestinal: Negative for abdominal pain, constipation, heartburn, nausea and vomiting.  Genitourinary: Negative for dysuria and urgency.  Musculoskeletal: Positive for myalgias. Negative for back pain and joint pain.  Skin: Negative for itching and rash.  Neurological: Negative for dizziness, sensory change, speech change and headaches.  Psychiatric/Behavioral: Negative for depression (had difficult time with loss of mother this year/mood improving. ).     Past Medical History:  Diagnosis Date  . Diabetes mellitus    uncontrolled, last A1c was 14  . E. coli sepsis ([place])   . Ectopic pregnancy   . Essential hypertension, benign 11/01/2008  . Fatty liver   . Gastroparesis   . GERD (gastroesophageal reflux disease)   . Helicobacter pylori gastritis 09/07/2011  . Migraine   . Morbid obesity with body mass index (BMI) of 40.0 to 49.9 ([place])   . Pulmonary edema   . Pyelonephritis   . Ureteral obstruction     Past Surgical History:  Procedure Laterality Date  . AMPUTATION Right 02/04/2019   Procedure: RIGHT BELOW KNEE AMPUTATION;  Surgeon: Newt Minion, MD;  Location: [facility];  Service: Orthopedics;  Laterality: Right;  . ESOPHAGOGASTRODUODENOSCOPY  2010  . WOUND DEBRIDEMENT Right 01/03/2019   Procedure: DEBRIDEMENT WOUND RIGHT LATERAL FOOT;  Surgeon: Angelia Mould, MD;  Location: [facility] OR;  Service: Vascular;  Laterality: Right;    Family History  Problem Relation Age  of Onset  . Arthritis Mother   . Asthma Mother   . COPD Mother   . Depression Mother   . Diabetes Mother   . Hypertension Mother   . Mental illness Mother   . Kidney disease Mother   . Crohn's disease Mother   . Aneurysm Father 39       brain  . Mental illness  Sister   . Arthritis Maternal Aunt   . Asthma Maternal Aunt   . COPD Maternal Aunt   . Depression Maternal Aunt   . Diabetes Maternal Aunt   . Hypertension Maternal Aunt   . Mental illness Maternal Aunt   . Stroke Maternal Aunt   . Heart failure Maternal Aunt   . Heart failure Maternal Grandfather   . Cancer Maternal Grandfather   . Diabetes Maternal Grandfather   . Heart disease Maternal Grandfather   . Hypertension Maternal Grandfather   . Hyperlipidemia Maternal Grandfather   . Mental illness Brother   . Diabetes Maternal Grandmother   . Heart disease Maternal Grandmother   . Hypertension Maternal Grandmother   . Hyperlipidemia Maternal Grandmother   . Diabetes Paternal Grandmother   . Heart disease Paternal Grandmother   . Diabetes Paternal Grandfather   . Cancer Paternal Grandfather     Social History:  Lives with fiancee. Works as Software engineer for [employer]. She  reports that she has never smoked. She has never used smokeless tobacco. She reports previous alcohol use. She reports that she does not use drugs.    Allergies  Allergen Reactions  . Bee Venom Anaphylaxis, Hives, Shortness Of Breath and Swelling  . Penicillins Itching    Has patient had a PCN reaction causing immediate rash, facial/tongue/throat swelling, SOB or lightheadedness with hypotension: yes Has patient had a PCN reaction causing severe rash involving mucus membranes or skin necrosis: unknown Has patient had a PCN reaction that required hospitalization : yes Has patient had a PCN reaction occurring within the last 10 years: no If all of the above answers are "NO", then may proceed with Cephalosporin use.    Medications Prior to Admission  Medication Sig Dispense Refill  . blood glucose meter kit and supplies Dispense based on patient and insurance preference. Use up to four times daily as directed. (FOR ICD-10 E10.9, E11.9). (Patient not taking: Reported on 02/01/2019) 1 each 0  . cephALEXin  (KEFLEX) 500 MG capsule Take 1 capsule (500 mg total) by mouth 3 (three) times daily. (Patient not taking: Reported on 02/01/2019) 21 capsule 1  . escitalopram (LEXAPRO) 10 MG tablet Take 1 tablet (10 mg total) by mouth daily. (Patient not taking: Reported on 01/28/2019) 90 tablet 0  . insulin lispro (HUMALOG KWIKPEN) 100 UNIT/ML KwikPen Inject 0.08 mLs (8 Units total) into the skin 3 (three) times daily before meals. (Patient not taking: Reported on 02/01/2019) 15 mL 0  . Insulin Pen Needle 31G X 6 MM MISC 1 pen by Does not apply route as directed. (Patient not taking: Reported on 02/01/2019) 100 each 0  . sulfamethoxazole-trimethoprim (BACTRIM) 400-80 MG tablet Take 1 tablet by mouth 2 (two) times daily. (Patient not taking: Reported on 02/01/2019) 28 tablet 0  . [DISCONTINUED] Insulin Glargine (LANTUS SOLOSTAR) 100 UNIT/ML Solostar Pen Inject 40 Units into the skin daily. (Patient not taking: Reported on 02/01/2019) 15 mL 0    Drug Regimen Review  Drug regimen was reviewed and remains appropriate with no significant issues identified  Home: Home Living Family/patient expects to be discharged  to:: Private residence Living Arrangements: Spouse/significant other Available Help at Discharge: Family Type of Home: Mobile home Home Access: Stairs to enter Entrance Stairs-Number of Steps: 2; pt states she can get a ramp built if needed Home Layout: One level Bathroom Shower/Tub: Chiropodist: Standard Home Equipment: Cane - single point, Crutches, Industrial/product designer History: Prior Function Level of Independence: Independent with assistive device(s) Comments: pt reports using cane and single crutch interchangeably as needed for ambulation PTA, 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 Pt with 21 Y/o daughter, fiance works and family are pt's neighbors  Functional Status:  Mobility: Bed Mobility Overal bed mobility: Needs Assistance Bed Mobility:  Supine to Sit Supine to sit: Supervision Sit to supine: Min assist General bed mobility comments: supervision for safety Transfers Overall transfer level: Needs assistance Equipment used: Rolling walker (2 wheeled) Transfers: Sit to/from Stand Sit to Stand: Min guard Stand pivot transfers: Min guard General transfer comment: Stand step turn transfer performed with RW and min guard assist Ambulation/Gait Ambulation/Gait assistance: Min guard Gait Distance (Feet): 10 Feet(2 trials of 10') Assistive device: Rolling walker (2 wheeled) Gait Pattern/deviations: (hop to gait) General Gait Details: hop to gait with increased step length during 2nd gait trial Gait velocity: reduced Gait velocity interpretation: <1.31 ft/sec, indicative of household Engineer, mining mobility: Yes Wheelchair propulsion: Both upper extremities Wheelchair parts: Supervision/cueing Distance: 200 Wheelchair Assistance Details (indicate cue type and reason): PT providing verbal cues for safety and efficiency during turns  ADL: ADL Overall ADL's : Needs assistance/impaired Eating/Feeding: Independent, Sitting Grooming: Wash/dry hands, Wash/dry face, Set up, Sitting Upper Body Bathing: Set up, Sitting Lower Body Bathing: Moderate assistance, Sitting/lateral leans Upper Body Dressing : Set up, Sitting Lower Body Dressing: Maximal assistance, Moderate assistance, Sitting/lateral leans Toilet Transfer: Minimal assistance, Min guard, RW, Stand-pivot, BSC, Cueing for safety, Cueing for sequencing Toileting- Clothing Manipulation and Hygiene: Sit to/from stand, Moderate assistance Functional mobility during ADLs: Minimal assistance, Min guard, Rolling walker, Cueing for safety, Cueing for sequencing  Cognition: Cognition Overall Cognitive Status: Within Functional Limits for tasks assessed Orientation Level: Oriented X4, Oriented to person, Oriented to place, Oriented to time, Oriented to  situation Cognition Arousal/Alertness: Awake/alert Behavior During Therapy: Anxious Overall Cognitive Status: Within Functional Limits for tasks assessed General Comments: pt emotional and tearful about amputation, pt stating she thought she had more time before R limb would be removed.    Blood pressure (!) 146/79, pulse (!) 114, temperature 98.3 F (36.8 C), temperature source Oral, resp. rate 17, height 5' 2" (1.575 m), weight 112.5 kg, last menstrual period 01/01/2019, SpO2 99 %. Physical Exam  Nursing note and vitals reviewed. Constitutional: She is oriented to person, place, and time. She appears well-developed.  Morbidly obese  HENT:  Head: Normocephalic and atraumatic.  Eyes: EOM are normal. Right eye exhibits no discharge. Left eye exhibits no discharge.  Neck: No tracheal deviation present. No thyromegaly present.  Respiratory: Effort normal. No respiratory distress.  GI: She exhibits no distension.  Musculoskeletal:     Comments: R-BKA edematous with wound VAC  LLE edema  Neurological: She is alert and oriented to person, place, and time.  Motor: B/l UE, LLE, Right hip 5/5 proximal to distal Sensation intact to light touch  Skin: Skin is warm and dry.  +VAC  Psychiatric: She has a normal mood and affect. Her behavior is normal.    Results for orders placed or performed during the hospital encounter of 01/31/19 (from  the past 48 hour(s))  Type and screen [place]     Status: None   Collection Time: 02/07/19 10:28 AM  Result Value Ref Range   ABO/RH(D) A POS    Antibody Screen NEG    Sample Expiration 02/10/2019,2359    Unit Number [id number]    Blood Component Type RED CELLS,LR    Unit division 00    Status of Unit ISSUED,FINAL    Transfusion Status OK TO TRANSFUSE    Crossmatch Result      Compatible Performed at [facility], [place] [address] [place], [place] [address]   Prepare RBC     Status: None   Collection Time: 02/07/19  10:34 AM  Result Value Ref Range   Order Confirmation      ORDER PROCESSED BY BLOOD BANK Performed at [facility], [place] [address], [place], [place] [address]   Glucose, capillary     Status: Abnormal   Collection Time: 02/07/19 12:41 PM  Result Value Ref Range   Glucose-Capillary 164 (H) 70 - 99 mg/dL  Glucose, capillary     Status: Abnormal   Collection Time: 02/07/19  4:42 PM  Result Value Ref Range   Glucose-Capillary 157 (H) 70 - 99 mg/dL  Glucose, capillary     Status: Abnormal   Collection Time: 02/07/19  9:29 PM  Result Value Ref Range   Glucose-Capillary 167 (H) 70 - 99 mg/dL   Comment 1 Document in Chart   Glucose, capillary     Status: Abnormal   Collection Time: 02/08/19  6:31 AM  Result Value Ref Range   Glucose-Capillary 206 (H) 70 - 99 mg/dL   Comment 1 Document in Chart   Hemoglobin and hematocrit, blood     Status: Abnormal   Collection Time: 02/08/19 11:04 AM  Result Value Ref Range   Hemoglobin 7.7 (L) 12.0 - 15.0 g/dL   HCT 24.7 (L) 36.0 - 46.0 %    Comment: Performed at [facility], [address], [place], [place] [address]  Glucose, capillary     Status: Abnormal   Collection Time: 02/08/19 11:48 AM  Result Value Ref Range   Glucose-Capillary 174 (H) 70 - 99 mg/dL  Glucose, capillary     Status: Abnormal   Collection Time: 02/08/19  4:25 PM  Result Value Ref Range   Glucose-Capillary 193 (H) 70 - 99 mg/dL  Glucose, capillary     Status: Abnormal   Collection Time: 02/08/19  9:15 PM  Result Value Ref Range   Glucose-Capillary 142 (H) 70 - 99 mg/dL   Comment 1 Document in Chart   Glucose, capillary     Status: Abnormal   Collection Time: 02/09/19  6:36 AM  Result Value Ref Range   Glucose-Capillary 246 (H) 70 - 99 mg/dL   Comment 1 Document in Chart    No results found.     Medical Problem List and Plan: 1.  Deficits with mobility and self-care secondary to right BKA.  Admit to CIR. 2.  Antithrombotics: -DVT/anticoagulation:   Pharmaceutical: Lovenox  -antiplatelet therapy: N/A 3. Pain Management: Oxycodone as needed.  We will add low-dose gabapentin to augment pain management/phantom symptoms. D/c dilaudid.   Monitor pain with increased activity. 4. Mood: LCSW to follow for evaluation and support  -antipsychotic agents: N/A 5. Neuropsych: This patient is capable of making decisions on on her own behalf. 6. Skin/Wound Care: Wound VAC changed to [place] with ?plans to d/c after  7 days. Continue Protostat as well as protein supplements to help promote healing 7. Fluids/Electrolytes/Nutrition: Monitor I/O.  CMP ordered for tomorrow.  8.  Sepsis with bacteremia: Has been afebrile.  Continue ceftriaxone 2 g every 24 for 4 total weeks end date 03/04/19 9. T2DM: Hgb A1C- 10.8.  Has not been on medications for years and now aware of importance of compliance.  Will monitor blood sugars AC/at bedtime.  Continue Lantus insulin and titrate upwards as indicated.   Monitor with increased mobility.  10. Nausea/vomiting: Has resolved with treatment of sepsis.  Continue PPI.  Denies issues with gastroparesis.  Will change Reglan to p.o. route and wean off as able. 11.  Mild tachycardia: We will monitor for now.  Blood pressures reasonable.  Monitor HR with increased exertion.  12.  Leukocytosis: Improving.  Continue to monitor for fevers or other signs of infection. 13.  Anemia: Iron deficiency with low folate levels and elevated ferritin.  Add folic acid.  Likely needs IV iron for supplementation  CBC ordered for tomorrow 14.  Hyponatremia: We will continue to monitor--should improve as N/V resolved and intake improving.  15.  Morbid obesity with malnutrition:  Protein supplements to promote wound healing.  Educated patient on importance of appropriate diet and weight loss to help promote health and activity.  Will consult dietitian for diet education.   Bary Leriche, PA-C 02/09/2019  I have personally performed a face to face  diagnostic evaluation, including, but not limited to relevant history and physical exam findings, of this patient and developed relevant assessment and plan.  Additionally, I have reviewed and concur with the physician assistant's documentation above.  Delice Lesch, MD, ABPMR   The patient's status has not changed. The original post admission physician evaluation remains appropriate, and any changes from the pre-admission screening or documentation from the acute chart are noted above.   Delice Lesch, MD, ABPMR

## 2019-02-09 NOTE — Progress Notes (Signed)
Report called to rehab and patient transported at this time to 4 mid room 3. No acute distress noted. Husband at bedside. Patient tolerated well.

## 2019-02-09 NOTE — Consult Note (Signed)
   [facility] CM Inpatient Consult   02/09/2019  Sheila Austin [birth year] [id number]   [facility] status: pending, previously outreached attempts by [place] Coordinator for the [place] were unsuccessful.  Patient screened for risk score for unplanned readmission score  and/or for hospitalizations to check if potential [place] Management services are needed.  Review of patient's medical record reveals patient is admitting to CIR. Patient with recent Right Below Knee amputation.  Primary Care Provider is Maximiano Coss, NP, [facility]   Will follow up with progress inpatient rehab. Call attempts to reach patient in the room without success. Will continue to follow and with inpatient rehab care management team.  Please place a [facility] Management consult as appropriate and for questions contact:   Natividad Brood, RN  BSN [facility] Liaison  [phone] business mobile phone Toll free office [phone]  Fax number: [phone] [email or web address]

## 2019-02-09 NOTE — Progress Notes (Signed)
Jamse Arn, MD  Physician  Physical Medicine and Rehabilitation  PMR Pre-admission  Signed  Date of Service:  02/06/2019 8:42 PM      Related encounter: ED to Hosp-Admission (Discharged) from 01/31/2019 in [facility] Admission Coordinator Pre-Admission Assessment  Patient: Sheila Austin is an 33 y.o., female MRN: [id number] DOB: May 05, 1986 Height: 5' 2"  (157.5 cm) Weight: 112.5 kg  Insurance Information HMO:    PPO: yes     PCP:      IPA:      80/20:      OTHER:  PRIMARY: [employer] [place] [employer] plus network THN/CONE/UHC/OON      Policy#:[id number]      Subscriber: Patient CM Name: Moshe Cipro      Phone#: [phone]     Fax#: [phone] Pre-Cert#: [id number]      Employer:  Josem Kaufmann provided by Caren Griffins on 10/2 for admit to CIR. Pt was originally approved 10/02-10/09 but due to acute medical concerns her admit was pushed back and new dates were verbally given from [place]. Pt is approved for 7 days (admitted 02/09/2019) Updates are due 02/16/2019. Clinical updates due to (f): [phone]  Benefits:  Phone #: online - [email or web address]    Eff. Date: 02/04/2013 - still active     Deduct: $300 ($300 met)      Out of Pocket Max: $7,900 (includes deductible - $7, 900)      Life Max: NA CIR: 80% coverage, 20% co-insurance      SNF: 80% coverage/20% co-insurance or 60% coverage, 40% co-insurance pending [facility] facility or [facility] network provider; limited to 120 days/cal yr.  Outpatient: medical necessity review starts after 25 visits   Co-Pay: $20-40/visit co-pay pending provider type Home Health: 80% coverage; no visit limit, but each visit cannot exceed 4 hours      Co-Pay: 20% co-insurance DME: 80% coverage     Co-Pay: 20% co-insurance Providers:  SECONDARY: none       Policy#:       Subscriber:  CM Name:       Phone#:      Fax#:  Pre-Cert#:       Employer:  Benefits:  Phone #:      Name:  Eff.  Date:      Deduct:      Out of Pocket Max:       Life Max:  CIR:       SNF:  Outpatient:     Co-Pay:  Home Health:      Co-Pay:  DME:      Co-Pay:   Medicaid Application Date:       Case Manager:  Disability Application Date:       Case Worker:   The Data Collection Information Summary for patients in Inpatient Rehabilitation Facilities with attached Privacy Act [place] Records was provided and verbally reviewed with: N/A  Emergency Contact Information         Contact Information    Name Relation Home Work Mobile   Nance,Otis Significant other   [phone]      Current Medical History  Patient Admitting Diagnosis: Right BKA History of Present Illness: Sheila Austin is a 33 year old female with history of T2DM, HTN, medical non-compliance for years, recent depressive episode due to loss of mother, morbid obesity, fatty liver who developed a blister this  summer that ruptured with onset of left food ulcer treated with I&D on 12/31/18 who left AMA post procedure due to family issues. She continued to have poor wound healing despite conservative care was admitted to [facility] 01/31/19 with nausea and vomiting, severe foot pain with foul drainage, inability to walk and weakness. She was found to be septic due to cellulitis and treated with IVF as well broad spectrum antibiotics. Due to extent of her osteomyelitis and infection it was felt there was no way to salvage the foot. The pt underwent right transtibial amputation on 02/04/2019. Pt has been seen by infectious disease for recommendations on duration of antibiotic. On Saturday, 10/3, pt was noted to have a drop in hemoglobin and she was given 1 unit packed red blood cells. Hgb went up from 6.6 to 7.7. On Monday, 10/5 pt did have a controlled fall with PT during session with no reports of injury. Pt has been evaluated by therapies and CIR has been recommended. Pt is to admit to CIR on 02/07/2019.     Patient's  medical record from [facility] has been reviewed by the rehabilitation admission coordinator and physician.  Past Medical History  Past Medical History:  Diagnosis Date   Diabetes mellitus    uncontrolled, last A1c was 14   E. coli sepsis ([place])    Ectopic pregnancy    Essential hypertension, benign 11/01/2008   Fatty liver    Gastroparesis    GERD (gastroesophageal reflux disease)    Helicobacter pylori gastritis 09/07/2011   Migraine    Morbid obesity with body mass index (BMI) of 40.0 to 49.9 (HCC)    Pulmonary edema    Pyelonephritis    Ureteral obstruction     Family History   family history includes Aneurysm (age of onset: 69) in her father; Arthritis in her maternal aunt and mother; Asthma in her maternal aunt and mother; COPD in her maternal aunt and mother; Cancer in her maternal grandfather and paternal grandfather; Crohn's disease in her mother; Depression in her maternal aunt and mother; Diabetes in her maternal aunt, maternal grandfather, maternal grandmother, mother, paternal grandfather, and paternal grandmother; Heart disease in her maternal grandfather, maternal grandmother, and paternal grandmother; Heart failure in her maternal aunt and maternal grandfather; Hyperlipidemia in her maternal grandfather and maternal grandmother; Hypertension in her maternal aunt, maternal grandfather, maternal grandmother, and mother; Kidney disease in her mother; Mental illness in her brother, maternal aunt, mother, and sister; Stroke in her maternal aunt.  Prior Rehab/Hospitalizations Has the patient had prior rehab or hospitalizations prior to admission? Yes  Has the patient had major surgery during 100 days prior to admission? Yes             Current Medications  Current Facility-Administered Medications:    0.9 %  sodium chloride infusion, , Intravenous, Continuous, Newt Minion, MD, Last Rate: 10 mL/hr at 02/08/19 0165    acetaminophen (TYLENOL) tablet 650 mg, 650 mg, Oral, Q6H PRN, 650 mg at 02/04/19 0413 **OR** acetaminophen (TYLENOL) suppository 650 mg, 650 mg, Rectal, Q6H PRN, Newt Minion, MD   acetaminophen (TYLENOL) tablet 325-650 mg, 325-650 mg, Oral, Q6H PRN, Newt Minion, MD, 650 mg at 02/08/19 0919   bisacodyl (DULCOLAX) suppository 10 mg, 10 mg, Rectal, Daily PRN, Newt Minion, MD   cefTRIAXone (ROCEPHIN) 2 g in sodium chloride 0.9 % 100 mL IVPB, 2 g, Intravenous, Q24H, Dixon, Stephanie N, NP, Last Rate: 200 mL/hr at 02/08/19 1307, 2  g at 02/08/19 1307   docusate sodium (COLACE) capsule 100 mg, 100 mg, Oral, BID, Newt Minion, MD, 100 mg at 02/09/19 1003   EPINEPHrine (EPI-PEN) injection 0.3 mg, 0.3 mg, Intramuscular, Once PRN, Dixon, Melton Krebs, NP   feeding supplement (GLUCERNA SHAKE) (GLUCERNA SHAKE) liquid 237 mL, 237 mL, Oral, Q1500, Newt Minion, MD, 237 mL at 02/07/19 2145   feeding supplement (PRO-STAT SUGAR FREE 64) liquid 30 mL, 30 mL, Oral, BID, Newt Minion, MD, 30 mL at 02/09/19 1003   heparin injection 5,000 Units, 5,000 Units, Subcutaneous, Q8H, Newt Minion, MD, 5,000 Units at 02/09/19 0542   HYDROmorphone (DILAUDID) injection 0.5-1 mg, 0.5-1 mg, Intravenous, Q4H PRN, Newt Minion, MD, 1 mg at 02/06/19 1448   insulin aspart (novoLOG) injection 0-20 Units, 0-20 Units, Subcutaneous, TID WC, Newt Minion, MD, 7 Units at 02/09/19 1003   insulin aspart (novoLOG) injection 0-5 Units, 0-5 Units, Subcutaneous, QHS, Newt Minion, MD, 2 Units at 02/01/19 2213   insulin glargine (LANTUS) injection 25 Units, 25 Units, Subcutaneous, Daily, Newt Minion, MD, 25 Units at 02/09/19 1004   magnesium citrate solution 1 Bottle, 1 Bottle, Oral, Once PRN, Newt Minion, MD   magnesium sulfate IVPB 1 g 100 mL, 1 g, Intravenous, Once, Vann, Jessica U, DO   methocarbamol (ROBAXIN) tablet 500 mg, 500 mg, Oral, Q6H PRN, 500 mg at 02/05/19 0547 **OR** methocarbamol (ROBAXIN) 500  mg in dextrose 5 % 50 mL IVPB, 500 mg, Intravenous, Q6H PRN, Newt Minion, MD   metoCLOPramide (REGLAN) injection 5 mg, 5 mg, Intravenous, Q6H, Newt Minion, MD, 5 mg at 02/09/19 0542   metoCLOPramide (REGLAN) tablet 5-10 mg, 5-10 mg, Oral, Q8H PRN **OR** metoCLOPramide (REGLAN) injection 5-10 mg, 5-10 mg, Intravenous, Q8H PRN, Newt Minion, MD   ondansetron (ZOFRAN) tablet 4 mg, 4 mg, Oral, Q6H PRN **OR** ondansetron (ZOFRAN) injection 4 mg, 4 mg, Intravenous, Q6H PRN, Newt Minion, MD, 4 mg at 02/01/19 1600   oxyCODONE (Oxy IR/ROXICODONE) immediate release tablet 10-15 mg, 10-15 mg, Oral, Q4H PRN, Newt Minion, MD, 10 mg at 02/09/19 1856   oxyCODONE (Oxy IR/ROXICODONE) immediate release tablet 5-10 mg, 5-10 mg, Oral, Q4H PRN, Newt Minion, MD, 5 mg at 02/09/19 1003   pantoprazole (PROTONIX) EC tablet 40 mg, 40 mg, Oral, Daily, Newt Minion, MD, 40 mg at 02/09/19 1004   polyethylene glycol (MIRALAX / GLYCOLAX) packet 17 g, 17 g, Oral, Daily PRN, Newt Minion, MD   promethazine (PHENERGAN) injection 12.5 mg, 12.5 mg, Intravenous, Q6H PRN, Newt Minion, MD, 12.5 mg at 02/02/19 0248   sodium chloride flush (NS) 0.9 % injection 3 mL, 3 mL, Intravenous, Q12H, Newt Minion, MD, 3 mL at 02/09/19 1012  Patients Current Diet:     Diet Order             Diet Carb Modified         Diet Carb Modified Fluid consistency: Thin; Room service appropriate? Yes  Diet effective now               Precautions / Restrictions Precautions Precautions: Fall Precaution Comments: wound VAC Other Brace: R residual limb protector(Limb protector not utilized as too small for residual limb) Restrictions Weight Bearing Restrictions: Yes RLE Weight Bearing: Non weight bearing   Has the patient had 2 or more falls or a fall with injury in the past year? 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 Drove PTA;  only used cane vs crutches 1 week prior to hosptialization   Prior Functional Level Self Care: Did the patient need help bathing, dressing, using the toilet or eating? Independent  Indoor Mobility: Did the patient need assistance with walking from room to room (with or without device)? Independent  Stairs: Did the patient need assistance with internal or external stairs (with or without device)? Independent  Functional Cognition: Did the patient need help planning regular tasks such as shopping or remembering to take medications? Independent  Home Assistive Devices / Equipment Home Assistive Devices/Equipment: Kasandra Knudsen (specify quad or straight), Crutches Home Equipment: Cane - single point, Crutches, Shower seat  Prior Device Use: Indicate devices/aids used by the patient prior to current illness, exacerbation or injury? None of the above  Current Functional Level Cognition  Overall Cognitive Status: Within Functional Limits for tasks assessed Orientation Level: Oriented X4 General Comments: pt with controlled fall during session and tearful/anxious after event. PT provides reassurance to calm patient    Extremity Assessment (includes Sensation/Coordination)  Upper Extremity Assessment: Overall WFL for tasks assessed  Lower Extremity Assessment: Overall WFL for tasks assessed, RLE deficits/detail RLE Deficits / Details: able to perform [facility] hip extension, quad set, SLR. Difficulty controlling RLE limb movement post-operatively due to change in weight of limb RLE Sensation: WNL    ADLs  Overall ADL's : Needs assistance/impaired Eating/Feeding: Independent, Sitting Grooming: Wash/dry hands, Wash/dry face, Set up, Sitting Upper Body Bathing: Set up, Sitting Lower Body Bathing: Moderate assistance, Sitting/lateral leans Upper Body Dressing : Set up, Sitting Lower Body Dressing: Maximal assistance, Moderate assistance, Sitting/lateral leans Toilet Transfer: Minimal assistance,  Min guard, RW, Stand-pivot, BSC, Cueing for safety, Cueing for sequencing Toileting- Clothing Manipulation and Hygiene: Sit to/from stand, Moderate assistance Functional mobility during ADLs: Minimal assistance, Min guard, Rolling walker, Cueing for safety, Cueing for sequencing    Mobility  Overal bed mobility: Modified Independent Bed Mobility: Supine to Sit Supine to sit: Modified independent (Device/Increase time) Sit to supine: Min assist General bed mobility comments: pt modI with use of bed rail and increased time    Transfers  Overall transfer level: Needs assistance Equipment used: Rolling walker (2 wheeled) Transfers: Sit to/from Stand Sit to Stand: Min guard Stand pivot transfers: Min guard General transfer comment: pt min guard with use of RW from bed, recliner, and chair without armrests    Ambulation / Gait / Stairs / Wheelchair Mobility  Ambulation/Gait Ambulation/Gait assistance: Min guard(intermittent periods of close supervision) Gait Distance (Feet): 10 Feet(2 trials of 7' prior to 10') Assistive device: Rolling walker (2 wheeled) Gait Pattern/deviations: (hop to pattern) General Gait Details: hop to gait with short step length, progressing to increased step length after PT verbal cues to do so. Pt with one standing rest break during initial ambulation trial. Pt with controlled fall during final bout of ambulation, as pt experienced L knee buckling during hop to gait. PT assisted pt with use of gait belt down to PT's knee where pt was able to sit temporarily. Pt and PT attempted to stand from this position however pt unable to achieve standing and returned back to PT knee. Pt then slowly assisted to floor from PT knee ~1 ft off ground. Pt reports no significant harm other than mild soreness to residual limb. Gait velocity: reduced Gait velocity interpretation: <1.31 ft/sec, indicative of household Engineer, mining mobility: Yes Wheelchair  propulsion: Both upper  extremities Wheelchair parts: Supervision/cueing Distance: 200 Wheelchair Assistance Details (indicate cue type and reason): PT providing verbal cues for safety and efficiency during turns    Posture / Balance Balance Overall balance assessment: Needs assistance Sitting-balance support: Bilateral upper extremity supported Sitting balance-Leahy Scale: Fair Standing balance support: Bilateral upper extremity supported Standing balance-Leahy Scale: Poor    Special needs/care consideration BiPAP/CPAP : no CPM : no Continuous Drip IV : 0.9% sodium chloride infusion; Ceftriaxone  Dialysis : no        Days : no Life Vest : no Oxygen : no Special Bed : no Trach Size : no Wound Vac (area) : yes       Location : RLE surgical incision  Skin: amputation site RLE, ecchymosis to Left abdomen, blister to right leg Bowel mgmt: continent, last BM: 02/07/2019 Bladder mgmt: continent  Diabetic mgmt: yes Behavioral consideration : no Chemo/radiation : no   Previous Home Environment (from acute therapy documentation) Living Arrangements: Spouse/significant other Available Help at Discharge: Family Type of Home: Mobile home Home Layout: One level Home Access: Stairs to enter [employer] of Steps: 2; pt states she can get a ramp built if needed Bathroom Shower/Tub: Chiropodist: Standard Home Care Services: No  Discharge Living Setting Plans for Discharge Living Setting: Patient's home, Lives with (comment)(fiance) Type of Home at Discharge: Mobile home(single wide) Discharge Home Layout: One level Discharge Home Access: Stairs to enter Entrance Stairs-Rails: None Entrance Stairs-Number of Steps: 2 steps to deck and then 1 ledge to enter Discharge Bathroom Shower/Tub: Tub/shower unit Discharge Bathroom Toilet: Standard Discharge Bathroom Accessibility: Yes How Accessible: Accessible via walker Does the patient have any problems  obtaining your medications?: No  Social/Family/Support Systems Patient Roles: Other (Comment)(full time nuse tech, 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, mother to 31 yo) Contact Information: fiance: Hendricks Milo [phone]) Anticipated Caregiver: Hendricks Milo, fiance's parents and sister in law live nextdoor and can stay with her as needed;  Anticipated Caregiver's Contact Information: see above Ability/Limitations of Caregiver: Min A Caregiver Availability: 24/7 Discharge Plan Discussed with Primary Caregiver: Yes Is Caregiver In Agreement with Plan?: Yes Does Caregiver/Family have Issues with Lodging/Transportation while Pt is in Rehab?: No  Visitor: Cytogeneticist  Goals/Additional Needs Patient/Family Goal for Rehab: PT/OT: Mod I; SLP: NA Expected length of stay: 7-10 days Cultural Considerations: NA Dietary Needs: carb modified, thin liquids; calorie level: medium 1600-2000 Equipment Needs: TBD Special Service Needs: new amputation  Pt/Family Agrees to Admission and willing to participate: Yes Program Orientation Provided & Reviewed with Pt/Caregiver Including Roles  & Responsibilities: Yes(Otis (fiance))  Barriers to Discharge: Home environment access/layout, IV antibiotics(open to building ramp if needed)  Barriers to Discharge Comments: steps to enter; environment (single wide may be tight for wc)  Decrease burden of Care through IP rehab admission: NA  Possible need for SNF placement upon discharge: Not anticipated; pt has great family support. Per fiance, he will take time off work as needed and they have a supportive, local family. Pt motivated to return straight home and as soon as possible. Pt has progressed well on acute and anticipate continued progress on CIR.   Patient Condition: I have reviewed medical records from [facility], spoken with RN, MD, and patient and her fiance. I met with patient at the bedside for inpatient rehabilitation assessment.  Patient will benefit from ongoing PT and  OT, can actively participate in 3 hours of therapy a day 5 days of the week, and can make measurable gains during the  admission.  Patient will also benefit from the coordinated team approach during an Inpatient Acute Rehabilitation admission.  The patient will receive intensive therapy as well as Rehabilitation physician, nursing, social worker, and care management interventions.  Due to safety, skin/wound care, disease management, medication administration, pain management and patient education the patient requires 24 hour a day rehabilitation nursing.  The patient is currently Min G with mobility and Min to Max A for basic ADLs.  Discharge setting and therapy post discharge at home with home health is anticipated.  Patient has agreed to participate in the Acute Inpatient Rehabilitation Program and will admit 02/09/2019.  Preadmission Screen Completed By:  Raechel Ache, 02/09/2019 10:57 AM ______________________________________________________________________   Discussed status with Dr. Posey Pronto on 02/09/2019 at 10:59AM and received approval for admission 02/09/2019.   Admission Coordinator:  Raechel Ache, OT, time 10:59AM/Date 02/09/2019   Assessment/Plan: Diagnosis: Right BKA  1. Does the need for close, 24 hr/day Medical supervision in concert with the patient's rehab needs make it unreasonable for this patient to be served in a less intensive setting? Potentially  2. Co-Morbidities requiring supervision/potential complications: P2TK, HTN, medical non-compliance for years, recent depressive episode due to loss of mother, morbid obesity, fatty liver 3. Due to safety, skin/wound care, pain management and patient education, does the patient require 24 hr/day rehab nursing? Potentially 4. Does the patient require coordinated care of a physician, rehab nurse, PT (1-2 hrs/day, 5 days/week) and OT (1-2 hrs/day, 5 days/week) to address physical and functional deficits in the context of the above medical  diagnosis(es)? Potentially Addressing deficits in the following areas: balance, endurance, locomotion, strength, transferring, bathing, dressing, toileting and psychosocial support 5. Can the patient actively participate in an intensive therapy program of at least 3 hrs of therapy 5 days a week? Yes 6. The potential for patient to make measurable gains while on inpatient rehab is good 7. Anticipated functional outcomes upon discharge from inpatients are: modified independent PT, modified independent OT, n/a SLP 8. Estimated rehab length of stay to reach the above functional goals is: 3-4 days. 9. Anticipated D/C setting: Home 10. Anticipated post D/C treatments: HH therapy and Home excercise program 11. Overall Rehab/Functional Prognosis: excellent  MD Signature: Delice Lesch, MD, ABPMR        Revision History Date/Time User Provider Type Action  02/09/2019 11:10 AM Jamse Arn, MD Physician Sign  02/09/2019 11:02 AM Raechel Ache, OT Rehab Admission Coordinator Share  02/06/2019 9:17 PM Raechel Ache, [place] Rehab Admission Coordinator Share  View Details Report

## 2019-02-09 NOTE — Progress Notes (Signed)
Inpatient Rehabilitation  Patient information reviewed and entered into eRehab system by Boyd Litaker M. Denisa Enterline, M.A., CCC/SLP, PPS Coordinator.  Information including medical coding, functional ability and quality indicators will be reviewed and updated through discharge.    

## 2019-02-09 NOTE — Plan of Care (Signed)
Will continue with plan of care. 

## 2019-02-09 NOTE — Progress Notes (Signed)
Patient ID: [name], female   DOB: [birth year], 33 y.o.   MRN: [id number] No drainage in the wound VAC canister.  Patient states her hemoglobin is stable plan for inpatient rehab today.  Discontinue the hospital wound VAC pump and attach her dressing to the portable [employer] wound VAC pump for discharge to CIR.

## 2019-02-10 ENCOUNTER — Inpatient Hospital Stay (HOSPITAL_COMMUNITY): Payer: 59

## 2019-02-10 ENCOUNTER — Inpatient Hospital Stay (HOSPITAL_COMMUNITY): Payer: 59 | Admitting: Occupational Therapy

## 2019-02-10 DIAGNOSIS — S88111A Complete traumatic amputation at level between knee and ankle, right lower leg, initial encounter: Secondary | ICD-10-CM

## 2019-02-10 LAB — CULTURE, BLOOD (ROUTINE X 2)
Culture: NO GROWTH
Culture: NO GROWTH
Special Requests: ADEQUATE

## 2019-02-10 LAB — GLUCOSE, CAPILLARY
Glucose-Capillary: 161 mg/dL — ABNORMAL HIGH (ref 70–99)
Glucose-Capillary: 143 mg/dL — ABNORMAL HIGH (ref 70–99)
Glucose-Capillary: 131 mg/dL — ABNORMAL HIGH (ref 70–99)
Glucose-Capillary: 118 mg/dL — ABNORMAL HIGH (ref 70–99)

## 2019-02-10 MED ORDER — CALCIUM CARBONATE ANTACID 500 MG PO CHEW
1.0000 | CHEWABLE_TABLET | Freq: Two times a day (BID) | ORAL | Status: DC
  Administered 2019-02-10 – 2019-02-19 (×18): 200 mg via ORAL
  Filled 2019-02-10 (×18): qty 1

## 2019-02-10 NOTE — Progress Notes (Signed)
Social Work Assessment and Plan   Patient Details  Name: Sheila Austin MRN: [id number] Date of Birth: Jun 27, 1985  Today's Date: 02/10/2019  Problem List:  Patient Active Problem List   Diagnosis Date Noted  . Unilateral complete BKA, right, initial encounter ([place]) 02/09/2019  . Acquired absence of right leg below knee ([place])   . Hyponatremia   . Acute blood loss anemia   . Sinus tachycardia   . Post-operative pain   . Bacteremia   . Sepsis with acute organ dysfunction without septic shock ([place])   . 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[place])   . Streptococcal bacteremia   . Klebsiella pneumoniae infection   . Abscess of leg, right   . Skin ulcer of right foot with necrosis of muscle ([place])   . Severe protein-calorie malnutrition ([place])   . Diabetic foot ulcer with osteomyelitis ([place]) 02/01/2019  . Adjustment disorder 02/01/2019  . Sepsis due to cellulitis ([place]) 02/01/2019  . Obesity 12/31/2018  . Facial cellulitis 07/02/2018  . Class 3 severe obesity with body mass index (BMI) of 40.0 to 44.9 in adult ([place]) 07/09/2017  . Abscess of right breast 02/24/2016  . BMI 45.0-49.9, adult ([place]) 09/29/2015  . Skin lesions - right lower extremity 12/07/2013  . Morbid obesity with BMI of 50.0-59.9, adult ([place]) 10/05/2011  . Obstructive sleep apnea 10/05/2011  . Hypertriglyceridemia 09/27/2011  . Migraine 09/07/2011  . Essential hypertension, benign 11/01/2008  . Gastroparesis 09/27/2008  . IRREGULAR MENSES 08/24/2008  . Diabetes mellitus out of control ([place]) 07/04/2006   Past Medical History:  Past Medical History:  Diagnosis Date  . Diabetes mellitus    uncontrolled, last A1c was 14  . E. coli sepsis ([place])   . Ectopic pregnancy   . Essential hypertension, benign 11/01/2008  . Fatty liver   . Gastroparesis   . GERD (gastroesophageal reflux disease)   . Helicobacter pylori gastritis 09/07/2011  . Migraine   . Morbid obesity with body mass index (BMI) of 40.0 to 49.9 ([place])   .  Pulmonary edema   . Pyelonephritis   . Ureteral obstruction    Past Surgical History:  Past Surgical History:  Procedure Laterality Date  . AMPUTATION Right 02/04/2019   Procedure: RIGHT BELOW KNEE AMPUTATION;  Surgeon: Newt Minion, MD;  Location: [facility];  Service: Orthopedics;  Laterality: Right;  . ESOPHAGOGASTRODUODENOSCOPY  2010  . WOUND DEBRIDEMENT Right 01/03/2019   Procedure: DEBRIDEMENT WOUND RIGHT LATERAL FOOT;  Surgeon: Angelia Mould, MD;  Location: [facility];  Service: Vascular;  Laterality: Right;   Social History:  reports that she has never smoked. She has never used smokeless tobacco. She reports previous alcohol use. She reports that she does not use drugs.  Family / Support Systems Marital Status: Divorced How Long?: 3-4 years Patient Roles: Production manager, Other (Comment)(employee) Spouse/Significant Other: Otis Nance-fiance [phone] Children: 53 yo fiance's daughter like pt's child Other Supports: Friends and co-workers Anticipated Caregiver: Hendricks Milo and family members Ability/Limitations of Caregiver: Hendricks Milo taking time off of work to be there with her for short time. Pt hopes she can be alone at home Caregiver Availability: 24/7 Family Dynamics: Close knit family and extended family, who will help in times of need. 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 She feels there was no choice and had to have it it was infected and she did all she could for it. She wants to recover and return to her independent life Recent Psychosocial Issues: other health issues-loss Mom in 2018-06-12 Psychiatric History: Some depression after MOm died in Jun 12, 2018. She takes some medication which she finds helpful. Do feel she would benefit from seeing neruo-psych while here Substance Abuse History: No issues  Patient / Family Perceptions, Expectations & Goals Pt/Family understanding of illness & functional limitations: Pt and Hendricks Milo can explain her amputation and have talked with the MD. Hendricks Milo plans on being here to see her in therapies to see her progress. Premorbid pt/family roles/activities: Fiance, employee, Step-mom, friend, etc Anticipated changes in roles/activities/participation: resume Pt/family expectations/goals: Pt states: " I want to be as independent as possible before I leave here."  Hendricks Milo states: " I will do what ever she needs she would for me."  [employer]: None Premorbid Home Care/DME Agencies: None Transportation available at discharge: Self Resource referrals recommended: Neuropsychology, Support group (specify)  Discharge Planning Living Arrangements: Spouse/significant other Support Systems: Spouse/significant other, Other relatives, Friends/neighbors Type of Residence: Private residence Insurance Resources: Multimedia programmer (specify)([facility]) Museum/gallery curator Resources: Employment, Secondary school teacher Screen Referred: No Living Expenses: Education officer, community Management: Patient, Spouse Does the patient have any problems obtaining your medications?: No Home Management: Both she and Transport planner Plans: Return  home with Hendricks Milo who is taking off to assist for short time. Her sister in-law is next door if needed. She hopes to return back to work when recovered. Aware team conference in am and will work on discharge needs. Social Work Anticipated Follow Up Needs: HH/OP  Clinical Impression Pleasant young woman who is motivated to recover and realizes her leg could not be saved. Her fiance-Otis is very supportive and involved and already working on ramp if needed. Await team conference goals and make neuro-psych referral.  Elease Hashimoto 02/10/2019, 10:13 AM

## 2019-02-10 NOTE — Evaluation (Signed)
Occupational Therapy Assessment and Plan  Patient Details  Name: Sheila Austin MRN: [id number] Date of Birth: 1986-02-14  OT Diagnosis: abnormal posture and muscle weakness (generalized) Rehab Potential: Rehab Potential (ACUTE ONLY): Fair ELOS: 7-10 days   Today's Date: 02/10/2019 OT Individual Time: 7654-6503 OT Individual Time Calculation (min): 73 min     Problem List:  Patient Active Problem List   Diagnosis Date Noted  . Unilateral complete BKA, right, initial encounter ([place]) 02/09/2019  . Acquired absence of right leg below knee ([place])   . Hyponatremia   . Acute blood loss anemia   . Sinus tachycardia   . Post-operative pain   . Bacteremia   . Sepsis with acute organ dysfunction without septic shock ([place])   . 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[place])   . Streptococcal bacteremia   . Klebsiella pneumoniae infection   . Abscess of leg, right   . Skin ulcer of right foot with necrosis of muscle ([place])   . Severe protein-calorie malnutrition ([place])   . Diabetic foot ulcer with osteomyelitis ([place]) 02/01/2019  . Adjustment disorder 02/01/2019  . Sepsis due to cellulitis ([place]) 02/01/2019  . Obesity 12/31/2018  . Facial cellulitis 07/02/2018  . Class 3 severe obesity with body mass index (BMI) of 40.0 to 44.9 in adult ([place]) 07/09/2017  . Abscess of right breast 02/24/2016  . BMI 45.0-49.9, adult ([place]) 09/29/2015  . Skin lesions - right lower extremity 12/07/2013  . Morbid obesity with BMI of 50.0-59.9, adult ([place]) 10/05/2011  . Obstructive sleep apnea 10/05/2011  . Hypertriglyceridemia 09/27/2011  . Migraine 09/07/2011  . Essential hypertension, benign 11/01/2008  . Gastroparesis 09/27/2008  . IRREGULAR MENSES 08/24/2008  . Diabetes mellitus out of control ([place]) 07/04/2006    Past Medical History:  Past Medical History:  Diagnosis Date  . Diabetes mellitus    uncontrolled, last A1c was 14  . E. coli sepsis ([place])   . Ectopic pregnancy   . Essential hypertension, benign  11/01/2008  . Fatty liver   . Gastroparesis   . GERD (gastroesophageal reflux disease)   . Helicobacter pylori gastritis 09/07/2011  . Migraine   . Morbid obesity with body mass index (BMI) of 40.0 to 49.9 ([place])   . Pulmonary edema   . Pyelonephritis   . Ureteral obstruction    Past Surgical History:  Past Surgical History:  Procedure Laterality Date  . AMPUTATION Right 02/04/2019   Procedure: RIGHT BELOW KNEE AMPUTATION;  Surgeon: Newt Minion, MD;  Location: [place];  Service: Orthopedics;  Laterality: Right;  . ESOPHAGOGASTRODUODENOSCOPY  2010  . WOUND DEBRIDEMENT Right 01/03/2019   Procedure: DEBRIDEMENT WOUND RIGHT LATERAL FOOT;  Surgeon: Angelia Mould, MD;  Location[facility] OR;  Service: Vascular;  Laterality: Right;    Assessment & Plan Clinical Impression: Patient is a 33 y.o. year old female with history of T2DM,  HTN, medical non-compliance for years, recent depressive episode due to loss of mother, morbid obesity , fatty liver who developed a blister this summer that ruptured with onset of left food ulcer treated with I & D 12/31/18 who left AMA post procedure due to family issues. History taken from chart review and patient. She continued to have poor wound healing despite conservative care and was admitted on  01/31/2019 due to progressive nausea, vomiting, drainage, foot pain, and weakness. She was found to be septic and treated with IVF as well broad spectrum antibiotics. MRI foot showed severe diffuse cellulitis with myofascitis, osteomyelitis of fifth metatarsal,  cuboid and calcaneus as well as abscess extending of the peroneal tendon.  Dr. Sharol Given consulted for input and recommended IV antibiotics for another 24 hours followed by amputation.  Blood cultures positive for strep intermedius and wound cultures showed Klebsiella pneumonia.  ID recommended high dose Rocephin x4 weeks.   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 Sharol Given reported that patient  had abscesses that extended through anterior and lateral compartment at level of amputation. Limb guard and stump shrinker ordered by [employer].  Fever resolving with repeat blood cultures negative so far.  Plans for PICC line placement on 02/07/2019. Hospital course further complicated by ABLA, with drop in hemoglobin on 10/03 to 6.6 and was transfused with I unit PRBC.  Pain control improving and therapy evaluations completed revealing deficits in mobility as well as ability to carry out ADLs.  CIR recommended due to functional decline. Please see preadmission assessment from earlier today as well.  .  Patient transferred to CIR on 02/09/2019 .    Patient currently requires mod with basic self-care skills secondary to muscle weakness, decreased cardiorespiratoy endurance, decreased safety awareness and decreased sitting balance, decreased standing balance and decreased balance strategies.  Prior to hospitalization, patient could complete ADLs and IADLs with modified independent .  Patient will benefit from skilled intervention to decrease level of assist with basic self-care skills prior to discharge home with care partner.  Anticipate patient will require intermittent supervision and minimal physical assistance and follow up outpatient.  OT - End of Session Activity Tolerance: Decreased this session Endurance Deficit: Yes Endurance Deficit Description: multiple rest breaks secondary to fatigue OT Assessment Rehab Potential (ACUTE ONLY): Fair OT Barriers to Discharge: Other (comments) OT Barriers to Discharge Comments: none known at this time OT Patient demonstrates impairments in the following area(s): Balance;Endurance;Motor;Pain;Safety OT Basic ADL's Functional Problem(s): Grooming;Bathing;Dressing;Toileting OT Advanced ADL's Functional Problem(s): Simple Meal Preparation OT Transfers Functional Problem(s): Toilet;Tub/Shower OT Additional Impairment(s): None OT Plan OT Intensity: Minimum of 1-2  x/day, 45 to 90 minutes OT Frequency: 5 out of 7 days OT Duration/Estimated Length of Stay: 7-10 days OT Treatment/Interventions: Balance/vestibular training;DME/adaptive equipment instruction;Patient/family education;Therapeutic Activities;Wheelchair propulsion/positioning;Functional electrical stimulation;Psychosocial support;Therapeutic Exercise;Community reintegration;Functional mobility training;UE/LE Strength taining/ROM;Discharge planning;Neuromuscular re-education;Skin care/wound managment;UE/LE Coordination activities;Disease mangement/prevention;Pain management OT Self Feeding Anticipated Outcome(s): n/a OT Basic Self-Care Anticipated Outcome(s): mod I OT Toileting Anticipated Outcome(s): min A OT Bathroom Transfers Anticipated Outcome(s): mod I OT Recommendation Recommendations for Other Services: Neuropsych consult;Therapeutic Recreation consult Therapeutic Recreation Interventions: Kitchen group;Outing/community reintergration Patient destination: Home Follow Up Recommendations: 24 hour supervision/assistance;Outpatient OT Equipment Recommended: To be determined   Skilled Therapeutic Intervention Upon entering the room, pt seated in wheelchair with significant other present in room. OT educating pt and caregiver on OT purpose, POC, and goals with them verbalizing understanding and agreement. Pt requesting to wash at sink this session. She propelled her wheelchair to sink and managed leg rests with increased time. Pt bathing UB with set up A. LB bathing while standing with min A balance. Pt needing assistance to thread pants onto L foot and assistance to pull over B hips secondary to body habitus. Pt returning to wheelchair and setting up wheelchair with min cuing. Min lateral scoot into bed towards L side. Sit >supine with min guard. Pt supine in bed with bed alarm activated and call bell within reach.    OT Evaluation Precautions/Restrictions  Precautions Precautions:  Fall Precaution Comments: wound VAC Required Braces or Orthoses: Other Brace Other Brace: R residual  limb protector- does not fit Vital Signs Therapy Vitals Temp: 98.5 F (36.9 C) Temp Source: Oral Pulse Rate: (!) 104 Resp: 17 BP: (!) 142/93 Patient Position (if appropriate): Sitting Oxygen Therapy SpO2: 99 % O2 Device: Room Air Pain Pain Assessment Pain Scale: 0-10 Pain Score: 0-No pain Faces Pain Scale: No hurt Home Living/Prior Functioning Home Living Family/patient expects to be discharged to:: Private residence Living Arrangements: Spouse/significant other Available Help at Discharge: Family Type of Home: Mobile home Home Access: Stairs to enter Entrance Stairs-Number of Steps: 2; pt states she can get a ramp built if needed Entrance Stairs-Rails: None Home Layout: One level Bathroom Shower/Tub: Chiropodist: Standard  Lives With: Family Prior Function Level of Independence: Independent with gait, Independent with transfers  Able to Take Stairs?: Yes Driving: Yes Vocation: Full time employment Vocation Requirements: Web designer Comments: pt reports using cane and single crutch interchangeably as needed for ambulation PTA, 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 Pt with 66 Y/o daughter, fiance works and family are pt's neighbors Vision Baseline Vision/History: No visual deficits Patient Visual Report: No change from baseline Cognition Overall Cognitive Status: Within Functional Limits for tasks assessed Arousal/Alertness: Awake/alert Orientation Level: Person;Place;Situation Person: Oriented Place: Oriented Situation: Oriented Year: 2020 Month: October Day of Week: Correct Memory: Appears intact Immediate Memory Recall: Sock;Blue;Bed Memory Recall Sock: Without Cue Memory Recall Blue: Without Cue Memory Recall Bed: Without Cue Sensation Sensation Light Touch: Appears Intact Hot/Cold: Appears  Intact Coordination Gross Motor Movements are Fluid and Coordinated: No Fine Motor Movements are Fluid and Coordinated: Yes Motor  Motor Motor: Other (comment) Motor - Skilled Clinical Observations: R BKA, generalized weakness Mobility  Bed Mobility Bed Mobility: Rolling Right;Rolling Left;Supine to Sit;Sit to Supine Rolling Right: Supervision/verbal cueing Rolling Left: Supervision/Verbal cueing Supine to Sit: Supervision/Verbal cueing Sit to Supine: Supervision/Verbal cueing Transfers Sit to Stand: Minimal Assistance - Patient > 75% Stand to Sit: Minimal Assistance - Patient > 75%  Trunk/Postural Assessment  Cervical Assessment Cervical Assessment: Within Functional Limits Thoracic Assessment Thoracic Assessment: Exceptions to WFL(decreased due to body habitus) Lumbar Assessment Lumbar Assessment: Exceptions to WFL(posterior pelvic tilt) Postural Control Postural Control: Deficits on evaluation  Balance Balance Balance Assessed: Yes Static Sitting Balance Static Sitting - Level of Assistance: 6: Modified independent (Device/Increase time) Dynamic Sitting Balance Dynamic Sitting - Level of Assistance: 5: Stand by assistance Static Standing Balance Static Standing - Level of Assistance: 4: Min assist Dynamic Standing Balance Dynamic Standing - Level of Assistance: 4: Min assist;3: Mod assist Extremity/Trunk Assessment RUE Assessment RUE Assessment: Within Functional Limits Active Range of Motion (AROM) Comments: WNL General Strength Comments: 3+/5 LUE Assessment Active Range of Motion (AROM) Comments: WNL General Strength Comments: 3+/5 throughout     Refer to Care Plan for Long Term Goals  Recommendations for other services: Neuropsych   Discharge Criteria: Patient will be discharged from OT if patient refuses treatment 3 consecutive times without medical reason, if treatment goals not met, if there is a change in medical status, if patient makes no progress  towards goals or if patient is discharged from hospital.  The above assessment, treatment plan, treatment alternatives and goals were discussed and mutually agreed upon: by patient  [name] 02/10/2019, 5:15 PM

## 2019-02-10 NOTE — Plan of Care (Signed)
  Problem: Consults Goal: RH LIMB LOSS PATIENT EDUCATION Description: Description: See Patient Education module for eduction specifics. Outcome: Progressing   Problem: RH SKIN INTEGRITY Goal: RH STG ABLE TO PERFORM INCISION/WOUND CARE W/ASSISTANCE Description: STG Able To Perform Incision/Wound Care Independently. Outcome: Progressing   Problem: RH SAFETY Goal: RH STG ADHERE TO SAFETY PRECAUTIONS W/ASSISTANCE/DEVICE Description: STG Adhere to Safety Precautions Independently. Outcome: Progressing   Problem: RH PAIN MANAGEMENT Goal: RH STG PAIN MANAGED AT OR BELOW PT'S PAIN GOAL Description: Pain level at or below level 3. Outcome: Progressing   Problem: RH KNOWLEDGE DEFICIT LIMB LOSS Goal: RH STG INCREASE KNOWLEDGE OF SELF CARE AFTER LIMB LOSS Description: Pt able to demonstrate wound care, dressing changes, skin monitoring, for drainage or inflammation. Pt states understanding of secondary prevention measures, using handouts and educational resources independently. Outcome: Progressing

## 2019-02-10 NOTE — Progress Notes (Signed)
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   Per pt, her pain is controlled; she reports her BGs have been ranging 130s-160s- only >200 x1 in hospitalization  That she's aware of.  She said she's trying to used to the "new normal" without a leg.  She has newfound determination to take care of herself- she is now engaged.  Objective:   No results found. Recent Labs    02/08/19 1104 02/09/19 1640  WBC  --  15.9*  HGB 7.7* 7.9*  HCT 24.7* 25.4*  PLT  --  634*   Recent Labs    02/09/19 1640  NA 133*  K 4.6  CL 101  CO2 24  GLUCOSE 168*  BUN 20  CREATININE 1.07*  CALCIUM 7.7*    Intake/Output Summary (Last 24 hours) at 02/10/2019 4709 Last data filed at 02/09/2019 1849 Gross per 24 hour  Intake 120 ml  Output -  Net 120 ml     Physical Exam: Vital Signs Blood pressure 126/88, pulse (!) 102, temperature 98.7 F (37.1 C), temperature source Oral, resp. rate 18, height 5' 2"  (1.575 m), weight 130.6 kg, SpO2 94 %.   Nursing note and vitals reviewed. Constitutional: awake, alert, appropriate, sitting up in manual w/c in room; conversant (works at [employer]), NAD Morbidly obese  HENT: very bad dentition/broken front teeth Head: Normocephalic and atraumatic.  Eyes: conjugate gaze Neck: No tracheal deviation present. No thyromegaly present.  Card- RRR Respiratory: Effort normal. No respiratory distress. CTA B/L  GI: soft, NT, ND, (+)BS  Musculoskeletal:     Comments: R-BKA edematous with wound VAC  LLE edema  Neurological: She is alert and oriented to person, place, and time.  Motor: B/l UE, LLE,  Right hip 5/5 proximal to distal Sensation intact to light touch  Skin: Skin is warm and dry.  +VAC  Psychiatric: She has a normal mood and affect. Her behavior is normal.    Assessment/Plan: 1. 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  Physiatrist is providing close team supervision and 24 hour management of active medical problems listed below.  Physiatrist and rehab team continue to assess barriers to discharge/monitor patient progress toward functional and medical goals  Care Tool:  Bathing              Bathing assist       Upper Body Dressing/Undressing Upper body dressing   What is the patient wearing?: Hospital gown only    Upper body assist Assist Level: Supervision/Verbal cueing    Lower Body Dressing/Undressing Lower body dressing            Lower body assist       Toileting Toileting    Toileting assist Assist for toileting: Moderate Assistance - Patient 50 - 74%     Transfers Chair/bed transfer  Transfers assist           Locomotion Ambulation   Ambulation assist              Walk 10 feet activity   Assist  Walk 50 feet activity   Assist           Walk 150 feet activity   Assist           Walk 10 feet on uneven surface  activity   Assist           Wheelchair     Assist               Wheelchair 50 feet with 2 turns activity    Assist            Wheelchair 150 feet activity     Assist          Blood pressure 126/88, pulse (!) 102, temperature 98.7 F (37.1 C), temperature source Oral, resp. rate 18, height 5' 2"  (1.575 m), weight 130.6 kg, SpO2 94 %.    Medical Problem List and Plan: 1.  Deficits with mobility and self-care secondary to right BKA.             Admit to CIR. 2.  Antithrombotics: -DVT/anticoagulation:  Pharmaceutical: Lovenox              -antiplatelet therapy: N/A 3. Pain Management: Oxycodone as needed.  We will add low-dose gabapentin to augment pain management/phantom symptoms. D/c dilaudid.   - pt reports pain is controlled currently.              Monitor pain with increased activity. 4. Mood: LCSW to follow for evaluation and support             -antipsychotic agents: N/A 5. Neuropsych: This patient is capable of making decisions on on her own behalf. 6. Skin/Wound Care: Wound VAC changed to [place] with ?plans to d/c after 7 days. Continue Protostat as well as protein supplements to help promote healing 7. Fluids/Electrolytes/Nutrition: Monitor I/O.  CMP ordered for tomorrow.  8.  Sepsis with bacteremia: Has been afebrile.  Continue ceftriaxone 2 g every 24 for 4 total weeks end date 03/04/19 9. T2DM: Hgb A1C- 10.8.  Has not been on medications for years and now aware of importance of compliance.  Will monitor blood sugars AC/at bedtime.  Continue Lantus insulin and titrate upwards as indicated.   -pt reports A1c was 14- brought down to 10.8 with diet changes             Monitor with increased mobility.  10. Nausea/vomiting: Has resolved with treatment of sepsis.  Continue PPI.  Denies issues with gastroparesis.  Will change Reglan to p.o. route and wean off as able. 11.  Mild tachycardia: We will monitor for now.  Blood pressures reasonable.             Monitor HR with increased exertion.  12.  Leukocytosis: Improving.  Continue to monitor for fevers or other signs of infection.  WBC 15.9- will recheck labs in AM along with baseline CRP/ESR- to do at least weekly.  13.  Anemia: Iron deficiency with low folate levels and elevated ferritin.  Add folic acid.  Likely needs IV iron for supplementation             CBC ordered for tomorrow 14.  Hyponatremia: We will continue to monitor--should improve as N/V resolved and intake improving.   Na improved to 133 15.  Morbid obesity with malnutrition:  Protein supplements to  promote wound healing.  Educated patient on importance of appropriate diet and weight loss to help promote health and activity.  Will consult dietitian for diet education. 16.  Hypocalcemia  Ca 7.7- will start on repletion. LOS: 1 days A FACE TO FACE EVALUATION WAS PERFORMED  Charlett Blake 02/10/2019, 6:28 AM

## 2019-02-10 NOTE — Plan of Care (Signed)
Nutrition Education Note  RD consulted for nutrition education regarding diabetes.  Spoke with pt and fiance at bedside. Pt eating lunch at time of RD visit.  Pt states that 1 month PTA she began a "boot camp" and made multiple changes to her diet. Pt states that prior to this, she did not care at all and ate everything and drank sodas. Pt reports that she no longer drinks sodas and does not plan to drink them again.  Pt reports that in the 1 month PTA, she was able to decrease her A1C from 14 to 10 and lose about 20 lbs. Pt is very motivated to continue making changes after discharge. Pt has been reviewing her "Living Well with Diabetes" booklet and has retained a good amount of information.  Lab Results  Component Value Date   HGBA1C 10.8 (H) 02/01/2019    RD provided "Carbohydrate Counting for People with Diabetes" handout from the Academy of Nutrition and Dietetics. Discussed different food groups and their effects on blood sugar, emphasizing carbohydrate-containing foods. Provided list of carbohydrates and recommended serving sizes of common foods.  Discussed importance of controlled and consistent carbohydrate intake throughout the day. Provided examples of ways to balance meals/snacks and encouraged intake of high-fiber, whole grain complex carbohydrates. Teach back method used.  Expect good compliance.  Body mass index is 52.66 kg/m. Pt meets criteria for obesity class III based on current BMI.  Current diet order is Carb Modified, patient is consuming approximately 100% of meals at this time. Labs and medications reviewed. No further nutrition interventions warranted at this time. RD contact information provided. If additional nutrition issues arise, please re-consult RD.   Gaynell Face, MS, RD, LDN Inpatient Clinical Dietitian Pager: [phone] Weekend/After Hours: [phone]

## 2019-02-10 NOTE — Progress Notes (Signed)
Physical Therapy Session Note  Patient Details  Name: Sheila Austin MRN: [id number] Date of Birth: [birth year]  Today's Date: 02/10/2019 PT Individual Time: 1001-1045 PT Individual Time Calculation (min): 44 min   Short Term Goals: Week 1:  PT Short Term Goal 1 (Week 1): = LTGs  Skilled Therapeutic Interventions/Progress Updates:    Pt seated in w/c upon PT arrival, agreeable to therapy tx and denies pain in R residual limb. Pt reports having to use the bathroom, pt performed stand pivot this session from w/c<>toilet using grab bars and min assist, therapist assisted with clothing management and pericare, cues for techniques, continent of bladder. Pt transported to the gym in w/c. Pt performed stand pivot from w/c>mat with RW and min assist. Pt transferred to supine with supervision and performed UE/LE therex this session for strengthening, 2 x 10 of the following with cues for techniques: bridges with bolster under R thigh, sidelying hip abduction R LE, prone push ups, seated tricep push ups, shoulder press with 3# dumbells, R LE LAQ. Pt transferred back to sitting with supervision, squat pivot to w/c with min assist and cues for techniques. Therapist educated pt on the importance of maintaining strength and ROM in residual limb for eventual prosthetic training. Pt transported back to room and left in w/c with needs in reach and chair alarm set.   Therapy Documentation Precautions:  Restrictions Weight Bearing Restrictions: Yes RLE Weight Bearing: Non weight bearing    Therapy/Group: Individual Therapy  Netta Corrigan, PT, DPT, CSRS Becky Sax PT, DPT   02/10/2019, 7:46 AM

## 2019-02-10 NOTE — Evaluation (Signed)
Physical Therapy Assessment and Plan  Patient Details  Name: Sheila Austin MRN: [id number] Date of Birth: [birth year]  PT Diagnosis: Difficulty walking, Edema, Impaired sensation, Muscle weakness and Pain in joint Rehab Potential: Good ELOS: 7-10 days   Today's Date: 02/10/2019 PT Individual Time: 0806-0922 PT Individual Time Calculation (min): 76 min    Problem List:  Patient Active Problem List   Diagnosis Date Noted  . Unilateral complete BKA, right, initial encounter ([place]) 02/09/2019  . Acquired absence of right leg below knee ([place])   . Hyponatremia   . Acute blood loss anemia   . Sinus tachycardia   . Post-operative pain   . Bacteremia   . Sepsis with acute organ dysfunction without septic shock ([place])   . 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[place])   . Streptococcal bacteremia   . Klebsiella pneumoniae infection   . Abscess of leg, right   . Skin ulcer of right foot with necrosis of muscle ([place])   . Severe protein-calorie malnutrition ([place])   . Diabetic foot ulcer with osteomyelitis ([place]) 02/01/2019  . Adjustment disorder 02/01/2019  . Sepsis due to cellulitis ([place]) 02/01/2019  . Obesity 12/31/2018  . Facial cellulitis 07/02/2018  . Class 3 severe obesity with body mass index (BMI) of 40.0 to 44.9 in adult ([place]) 07/09/2017  . Abscess of right breast 02/24/2016  . BMI 45.0-49.9, adult ([place]) 09/29/2015  . Skin lesions - right lower extremity 12/07/2013  . Morbid obesity with BMI of 50.0-59.9, adult ([place]) 10/05/2011  . Obstructive sleep apnea 10/05/2011  . Hypertriglyceridemia 09/27/2011  . Migraine 09/07/2011  . Essential hypertension, benign 11/01/2008  . Gastroparesis 09/27/2008  . IRREGULAR MENSES 08/24/2008  . Diabetes mellitus out of control ([place]) 07/04/2006    Past Medical History:  Past Medical History:  Diagnosis Date  . Diabetes mellitus    uncontrolled, last A1c was 14  . E. coli sepsis ([place])   . Ectopic pregnancy   . Essential hypertension, benign  11/01/2008  . Fatty liver   . Gastroparesis   . GERD (gastroesophageal reflux disease)   . Helicobacter pylori gastritis 09/07/2011  . Migraine   . Morbid obesity with body mass index (BMI) of 40.0 to 49.9 ([place])   . Pulmonary edema   . Pyelonephritis   . Ureteral obstruction    Past Surgical History:  Past Surgical History:  Procedure Laterality Date  . AMPUTATION Right 02/04/2019   Procedure: RIGHT BELOW KNEE AMPUTATION;  Surgeon: Newt Minion, MD;  Location: [facility];  Service: Orthopedics;  Laterality: Right;  . ESOPHAGOGASTRODUODENOSCOPY  2010  . WOUND DEBRIDEMENT Right 01/03/2019   Procedure: DEBRIDEMENT WOUND RIGHT LATERAL FOOT;  Surgeon: Angelia Mould, MD;  Location: [facility] OR;  Service: Vascular;  Laterality: Right;    Assessment & Plan Clinical Impression: Patient is a 33 year old female with history of T2DM,  HTN, medical non-compliance for years, recent depressive episode due to loss of mother, morbid obesity , fatty liver who developed a blister this summer that ruptured with onset of left food ulcer treated with I & D 12/31/18 who left AMA post procedure due to family issues. History taken from chart review and patient. She continued to have poor wound healing despite conservative care and was admitted on  01/31/2019 due to progressive nausea, vomiting, drainage, foot pain, and weakness. She was found to be septic and treated with IVF as well broad spectrum antibiotics. MRI foot showed severe diffuse cellulitis with myofascitis, osteomyelitis of fifth metatarsal, cuboid  and calcaneus as well as abscess extending of the peroneal tendon.  Dr. Sharol Given consulted for input and recommended IV antibiotics for another 24 hours followed by amputation.  Blood cultures positive for strep intermedius and wound cultures showed Klebsiella pneumonia.  ID recommended high dose Rocephin x4 weeks.   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 Sharol Given reported that patient had  abscesses that extended through anterior and lateral compartment at level of amputation. Limb guard and stump shrinker ordered by [employer].  Fever resolving with repeat blood cultures negative so far.  Plans for PICC line placement on 02/07/2019. Hospital course further complicated by ABLA, with drop in hemoglobin on 10/03 to 6.6 and was transfused with I unit PRBC.  Pain control improving and therapy evaluations completed revealing deficits in mobility as well as ability to carry out ADLs.  CIR recommended due to functional decline.  Patient transferred to CIR on 02/09/2019 .   Patient currently requires min to mod assist with mobility secondary to muscle weakness and muscle joint tightness, decreased cardiorespiratoy endurance and decreased sitting balance, decreased standing balance and decreased balance strategies.  Prior to hospitalization, patient was independent  with mobility and lived with Family in a Mobile home home.  Home access is 2; pt states she can get a ramp built if neededStairs to enter.  Patient will benefit from skilled PT intervention to maximize safe functional mobility, minimize fall risk and decrease caregiver burden for planned discharge home with 24 hour supervision.  Anticipate patient will benefit from follow up [place] at discharge.  PT - End of Session Activity Tolerance: Decreased this session Endurance Deficit: Yes PT Assessment Rehab Potential (ACUTE/IP ONLY): Good PT Barriers to Discharge: Inaccessible home environment;Medication compliance PT Barriers to Discharge Comments: has 2 STE but likely will have a ramp installed - may not be ready for d/c and can teach bumping up in w/c PT Patient demonstrates impairments in the following area(s): Balance;Edema;Endurance;Motor;Pain;Safety;Skin Integrity;Sensory PT Transfers Functional Problem(s): Bed Mobility;Bed to Chair;Car;Furniture PT Locomotion Functional Problem(s): Ambulation;Wheelchair Mobility;Stairs PT Plan PT  Intensity: Minimum of 1-2 x/day ,45 to 90 minutes PT Frequency: 5 out of 7 days PT Duration Estimated Length of Stay: 7-10 days PT Treatment/Interventions: Ambulation/gait training;Balance/vestibular training;Community reintegration;Discharge planning;Disease management/prevention;DME/adaptive equipment instruction;Functional electrical stimulation;Functional mobility training;Neuromuscular re-education;Pain management;Patient/family education;Psychosocial support;Skin care/wound management;Splinting/orthotics;Stair training;Therapeutic Activities;Therapeutic Exercise;UE/LE Strength taining/ROM;UE/LE Coordination activities;Wheelchair propulsion/positioning PT Transfers Anticipated Outcome(s): mod I basic transfers; supervision car PT Locomotion Anticipated Outcome(s): mod I w/c mobility; CGA short distance gait PT Recommendation Recommendations for Other Services: Neuropsych consult Follow Up Recommendations: Home health PT Patient destination: Home Equipment Recommended: Wheelchair cushion (measurements);Wheelchair (measurements);Rolling walker with 5" wheels  Skilled Therapeutic Intervention Evaluation completed (see details above and below) with education on PT POC and goals and individual treatment initiated with focus on functional transfer training techniques (with and without RW), sit <> stand retraining, functional dynamic sitting balance and bed mobility during dressing, pre-gait and gait training, w/c parts management and mobility, and simulated car transfer training. Education provided on importance of residual limb positioning, use of compression for edema control and shaping (did not have shrinker and limb guard does not fit - reports [employer] was aware). Pt performing mobility with overall min assist throughout session with verbal and demonstrational cues for technique, hand placement, and overall safety. Pt with decreased ability to perform squat pivot due to body habitus and decreased  coordination of movement with tendency wanting to lean posteriorly or not lift up using LLE. Sit<>  stands with min assist and min assist for functional standing balance with 1 UE support while working on pulling up pants. Pt requires extra time and education to aid with overall anxiousness with mobility due to fall yesterday. Emotional support provided throughout session. Pt very motivated to improve and work on all task   PT Evaluation Precautions/Restrictions Precautions Precautions: Fall Precaution Comments: wound VAC Restrictions Weight Bearing Restrictions: Yes RLE Weight Bearing: Non weight bearing Pain  Premedicated for pain in R residual limb. Reports she is trying to limit her pain medication needs.  Home Living/Prior Functioning Home Living Living Arrangements: Spouse/significant other Available Help at Discharge: Family(husband around 24/7 first few weeks) Type of Home: Mobile home Home Access: Stairs to enter Entrance Stairs-Number of Steps: 2; pt states she can get a ramp built if needed Entrance Stairs-Rails: None Home Layout: One level  Lives With: Family Prior Function Level of Independence: Independent with gait;Independent with transfers  Able to Take Stairs?: Yes Driving: Yes Vocation: Full time employment Vocation Requirements: Theatre stage manager: Within Functional Limits Praxis Praxis: Intact  Cognition Overall Cognitive Status: Within Functional Limits for tasks assessed Safety/Judgment: Appears intact Sensation Sensation Light Touch: Appears Intact(residual limb not fully tested due to wound vac dressing) Additional Comments: phantom pain/sensation on RLE Coordination Gross Motor Movements are Fluid and Coordinated: No Motor  Motor Motor: Other (comment)(generalized weakness) Motor - Skilled Clinical Observations: R BKA  Mobility Bed Mobility Bed Mobility: Rolling Right;Rolling Left;Supine to Sit;Sit  to Supine Rolling Right: Supervision/verbal cueing Rolling Left: Supervision/Verbal cueing Supine to Sit: Supervision/Verbal cueing Sit to Supine: Supervision/Verbal cueing Transfers Transfers: Sit to Stand;Stand to Sit;Stand Pivot Transfers;Lateral/Scoot Transfers Sit to Stand: Minimal Assistance - Patient > 75% Stand to Sit: Minimal Assistance - Patient > 75% Stand Pivot Transfers: Minimal Assistance - Patient > 75%;Moderate Assistance - Patient 50 - 74% Lateral/Scoot Transfers: Minimal Assistance - Patient > 75% Transfer (Assistive device): Rolling walker Locomotion  Pre-gat in parallel bars with min assist for forward and backward stepping with focus on use of UE's to support weight and clear LLE vs "hopping" with LLE. Several reps with min assist and cues for technique.  Gait Gait Distance (Feet): 5 Feet Assistive device: Rolling walker Min assist +2 for w/c follow for safety Gait Gait Pattern: Impaired Stairs / Additional Locomotion Stairs: No Wheelchair Mobility Wheelchair Mobility: Yes Wheelchair Assistance: Chartered loss adjuster: Both upper extremities Wheelchair Parts Management: Needs assistance Distance: 150'  Trunk/Postural Assessment  Cervical Assessment Cervical Assessment: Within Functional Limits Thoracic Assessment Thoracic Assessment: (decreased trunk mobility due to body habitus) Lumbar Assessment Lumbar Assessment: (posterior pelvic tilt) Postural Control Postural Control: Deficits on evaluation(decreased balance strategies in standing due to BKA)  Balance Balance Balance Assessed: Yes Static Sitting Balance Static Sitting - Level of Assistance: 6: Modified independent (Device/Increase time) Dynamic Sitting Balance Dynamic Sitting - Level of Assistance: 5: Stand by assistance Static Standing Balance Static Standing - Level of Assistance: 4: Min assist Dynamic Standing Balance Dynamic Standing - Level of Assistance: 4: Min  assist;3: Mod assist Extremity Assessment      RLE Assessment RLE Assessment: Exceptions to [facility] Passive Range of Motion (PROM) Comments: limited due to body habitus and wound vac dressing General Strength Comments: grossly 3-/5 hip and knee(BKA) LLE Assessment LLE Assessment: Within Functional Limits General Strength Comments: grossly 4/5    Refer to Care Plan for Long Term Goals  Recommendations for other services: Neuropsych  Discharge Criteria: Patient will be discharged from PT  if patient refuses treatment 3 consecutive times without medical reason, if treatment goals not met, if there is a change in medical status, if patient makes no progress towards goals or if patient is discharged from hospital.  The above assessment, treatment plan, treatment alternatives and goals were discussed and mutually agreed upon: by patient  Juanna Cao, PT, DPT, CBIS  02/10/2019, 11:35 AM

## 2019-02-10 NOTE — Care Management Note (Signed)
Inpatient Rehabilitation Center Individual Statement of Services  Patient Name:  Sheila Austin  Date:  02/10/2019  Welcome to the [facility].  Our goal is to provide you with an individualized program based on your diagnosis and situation, designed to meet your specific needs.  With this comprehensive rehabilitation program, you will be expected to participate in at least 3 hours of rehabilitation therapies Monday-Friday, with modified therapy programming on the weekends.  Your rehabilitation program will include the following services:  Physical Therapy (PT), Occupational Therapy (OT), 24 hour per day rehabilitation nursing, Neuropsychology, Case Management (Social Worker), Rehabilitation Medicine, Nutrition Services and Pharmacy Services  Weekly team conferences will be held on Wednesday to discuss your progress.  Your Social Worker will talk with you frequently to get your input and to update you on team discussions.  Team conferences with you and your family in attendance may also be held.  Expected length of stay: 7-10 days  Overall anticipated outcome: independent with transfers-CGA-ambulation  Depending on your progress and recovery, your program may change. Your Social Worker will coordinate services and will keep you informed of any changes. Your Social Worker's name and contact numbers are listed  below.  The following services may also be recommended but are not provided by the [facility] will be made to provide these services after discharge if needed.  Arrangements include referral to agencies that provide these services.  Your insurance has been verified to be:  [employer] Your primary doctor is:  Maximiano Coss  Pertinent information will be shared with your doctor and your insurance  company.  Social Worker:  Ovidio Kin, [place] or ([phone]  Information discussed with and copy given to patient by: Elease Hashimoto, 02/10/2019, 9:58 AM

## 2019-02-11 ENCOUNTER — Inpatient Hospital Stay (HOSPITAL_COMMUNITY): Payer: 59 | Admitting: Occupational Therapy

## 2019-02-11 ENCOUNTER — Inpatient Hospital Stay: Payer: Self-pay

## 2019-02-11 ENCOUNTER — Inpatient Hospital Stay (HOSPITAL_COMMUNITY): Payer: 59 | Admitting: Physical Therapy

## 2019-02-11 LAB — CBC WITH DIFFERENTIAL/PLATELET
Abs Immature Granulocytes: 0.28 10*3/uL — ABNORMAL HIGH (ref 0.00–0.07)
Basophils Absolute: 0 10*3/uL (ref 0.0–0.1)
Basophils Relative: 0 %
Eosinophils Absolute: 0.2 10*3/uL (ref 0.0–0.5)
Eosinophils Relative: 2 %
HCT: 24 % — ABNORMAL LOW (ref 36.0–46.0)
Hemoglobin: 7.2 g/dL — ABNORMAL LOW (ref 12.0–15.0)
Immature Granulocytes: 2 %
Lymphocytes Relative: 19 %
Lymphs Abs: 2.3 10*3/uL (ref 0.7–4.0)
MCH: 26.9 pg (ref 26.0–34.0)
MCHC: 30 g/dL (ref 30.0–36.0)
MCV: 89.6 fL (ref 80.0–100.0)
Monocytes Absolute: 0.8 10*3/uL (ref 0.1–1.0)
Monocytes Relative: 7 %
Neutro Abs: 8.2 10*3/uL — ABNORMAL HIGH (ref 1.7–7.7)
Neutrophils Relative %: 70 %
Platelets: 598 10*3/uL — ABNORMAL HIGH (ref 150–400)
RBC: 2.68 MIL/uL — ABNORMAL LOW (ref 3.87–5.11)
RDW: 14.4 % (ref 11.5–15.5)
WBC: 11.9 10*3/uL — ABNORMAL HIGH (ref 4.0–10.5)
nRBC: 0 % (ref 0.0–0.2)

## 2019-02-11 LAB — GLUCOSE, CAPILLARY
Glucose-Capillary: 136 mg/dL — ABNORMAL HIGH (ref 70–99)
Glucose-Capillary: 146 mg/dL — ABNORMAL HIGH (ref 70–99)
Glucose-Capillary: 126 mg/dL — ABNORMAL HIGH (ref 70–99)
Glucose-Capillary: 121 mg/dL — ABNORMAL HIGH (ref 70–99)

## 2019-02-11 LAB — C-REACTIVE PROTEIN: CRP: 4.9 mg/dL — ABNORMAL HIGH (ref <1.0)

## 2019-02-11 LAB — SEDIMENTATION RATE: Sed Rate: >140 mm/hr — ABNORMAL HIGH (ref 0–22)

## 2019-02-11 LAB — COMPREHENSIVE METABOLIC PANEL
ALT: 10 U/L (ref 0–44)
AST: 13 U/L — ABNORMAL LOW (ref 15–41)
Albumin: 1.4 g/dL — ABNORMAL LOW (ref 3.5–5.0)
Alkaline Phosphatase: 194 U/L — ABNORMAL HIGH (ref 38–126)
Anion gap: 9 (ref 5–15)
BUN: 22 mg/dL — ABNORMAL HIGH (ref 6–20)
CO2: 25 mmol/L (ref 22–32)
Calcium: 8.4 mg/dL — ABNORMAL LOW (ref 8.9–10.3)
Chloride: 102 mmol/L (ref 98–111)
Creatinine, Ser: 0.88 mg/dL (ref 0.44–1.00)
GFR calc Af Amer: >60 mL/min (ref >60)
GFR calc non Af Amer: >60 mL/min (ref >60)
Glucose, Bld: 141 mg/dL — ABNORMAL HIGH (ref 70–99)
Potassium: 5 mmol/L (ref 3.5–5.1)
Sodium: 136 mmol/L (ref 135–145)
Total Bilirubin: 0.4 mg/dL (ref 0.3–1.2)
Total Protein: 6.7 g/dL (ref 6.5–8.1)

## 2019-02-11 MED ORDER — METOCLOPRAMIDE HCL 5 MG PO TABS
5.0000 mg | ORAL_TABLET | Freq: Three times a day (TID) | ORAL | Status: DC
  Administered 2019-02-11 – 2019-02-16 (×14): 5 mg via ORAL
  Filled 2019-02-11 (×14): qty 1

## 2019-02-11 MED ORDER — BETHANECHOL CHLORIDE 10 MG PO TABS
5.0000 mg | ORAL_TABLET | Freq: Three times a day (TID) | ORAL | Status: DC
  Administered 2019-02-11 – 2019-02-14 (×12): 5 mg via ORAL
  Filled 2019-02-11 (×12): qty 1

## 2019-02-11 MED ORDER — TAMSULOSIN HCL 0.4 MG PO CAPS
0.4000 mg | ORAL_CAPSULE | Freq: Every day | ORAL | Status: DC
  Administered 2019-02-11: 0.4 mg via ORAL
  Filled 2019-02-11: qty 1

## 2019-02-11 NOTE — Progress Notes (Signed)
Physical Therapy Session Note  Patient Details  Name: Sheila Austin MRN: [id number] Date of Birth: 1986/03/12  Today's Date: 02/11/2019 PT Individual Time: 1400-1500 PT Individual Time Calculation (min): 60 min   Short Term Goals: Week 1:  PT Short Term Goal 1 (Week 1): = LTGs  Skilled Therapeutic Interventions/Progress Updates:  Pt received upright in wheelchair, agreeable to therapy. Pt had IV medication running for the duration of the session. Pt transported to the therapy gym. Pt perform squat pivot transfer to the mat table with minA after 3 attempts d/t poor setup. Pt completed 2x10 bilaterally of the following exercises: in supine, glute squeezes, SAQ, and isometric hip extension; in prone, hip extension; in seated position, LAQ. Pt requires verbal cueing for proper form of exercises. Pt performed squat pivot transfer with minA back to the wheelchair. Pt performed sit>stand and ambulated 12 feetx2 with minA and RW. Pt performed stand pivot transfer with RW and minA. Pt self propelled back to her room about 75 feet. Pt left sitting upright in her wheelchair, safety cushion activated, and call bell within reach. All needs met at this time. During session, PT noticed poor seal at the top of wound vac dressing on the R residual limb and the PA was notified about this.   Therapy Documentation Precautions:  Precautions Precautions: Fall Precaution Comments: wound VAC Required Braces or Orthoses: Other Brace Other Brace: R residual limb protector- does not fit Restrictions Weight Bearing Restrictions: Yes RLE Weight Bearing: Non weight bearing  Pain: denies pain   Therapy/Group: Individual Therapy  Olena Leatherwood, SPT  02/11/2019, 3:35 PM

## 2019-02-11 NOTE — Progress Notes (Signed)
Social Work Patient ID: Sheila Austin, female   DOB: 09-23-1985, 33 y.o.   MRN: [id number] Met with pt to discuss team conference goals supervision-mod/i and target discharge 10/15. She feels for her second day she is doing well and her pain is not that bad. Discussed equipment and follow up, will work on this. Her fiance has been here for therapies yesterday and working on ramp for home.

## 2019-02-11 NOTE — Progress Notes (Signed)
Physical Therapy Session Note  Patient Details  Name: Sheila Austin MRN: [id number] Date of Birth: [birth year]  Today's Date: 02/11/2019 PT Individual Time: 1005-1030 PT Individual Time Calculation (min): 25 min   Short Term Goals: Week 1:  PT Short Term Goal 1 (Week 1): = LTGs  Skilled Therapeutic Interventions/Progress Updates:   Pt received sitting in WC and agreeable to PT. Pt noted to be tearful and reports significant pain in the RLE. RN made aware and pain medication administered. Pt transported to rehab gym in [facility]. Gait in parallel bars, 2 steps forward/2 steps reverse x 2. Pt required min assist an first bout and mod assist on second bout due to knee collapse on first step. Moderate cues for improved use of BUE to reduce stress on the LLE with heel contact.   PT instructed pt in WC mobility x 232f + 1585fwith supervision assist and min cues for increased shoulder ROM and use of momentum to prevent fatigue and increased control. Short therapeutic rest break required between bouts. Patient returned to room and [facility] in reach and all needs met.         Therapy Documentation Precautions:  Precautions Precautions: Fall Precaution Comments: wound VAC Required Braces or Orthoses: Other Brace Other Brace: R residual limb protector- does not fit Restrictions Weight Bearing Restrictions: Yes RLE Weight Bearing: Non weight bearing Pain: Pain Assessment Pain Scale: 0-10 Pain Score: 7  Pain Type: Surgical pain Pain Location: Leg Pain Orientation: Right Pain Descriptors / Indicators: Constant Pain Onset: On-going Patients Stated Pain Goal: 3 Pain Intervention(s): Medication (See eMAR)    Therapy/Group: Individual Therapy  AuLorie Phenix0/11/2018, 11:35 AM

## 2019-02-11 NOTE — Progress Notes (Signed)
[place] PHYSICAL MEDICINE & REHABILITATION PROGRESS NOTE   Subjective/Complaints:  No issues overnite except requiring urinary cath, no bladder pain or spasm. No prior hx of retention  ROS- no bowel problems, no CP or SOB  Objective:   No results found. Recent Labs    02/09/19 1640 02/11/19 0443  WBC 15.9* 11.9*  HGB 7.9* 7.2*  HCT 25.4* 24.0*  PLT 634* 598*   Recent Labs    02/09/19 1640 02/11/19 0443  NA 133* 136  K 4.6 5.0  CL 101 102  CO2 24 25  GLUCOSE 168* 141*  BUN 20 22*  CREATININE 1.07* 0.88  CALCIUM 7.7* 8.4*    Intake/Output Summary (Last 24 hours) at 02/11/2019 0723 Last data filed at 02/11/2019 0144 Gross per 24 hour  Intake 680 ml  Output 1100 ml  Net -420 ml     Physical Exam: Vital Signs Blood pressure (!) 145/90, pulse 100, temperature 98.5 F (36.9 C), temperature source Oral, resp. rate 18, height 5' 2" (1.575 m), weight 129.9 kg, SpO2 97 %.   Nursing note and vitals reviewed. Constitutional: awake, alert, appropriate, sitting up in manual w/c in room; conversant (works at [employer]), NAD Morbidly obese  HENT: very bad dentition/broken front teeth Head: Normocephalic and atraumatic.  Eyes: conjugate gaze Neck: No tracheal deviation present. No thyromegaly present.  Card- RRR Respiratory: Effort normal. No respiratory distress. CTA B/L  GI: soft, NT, ND, (+)BS  Musculoskeletal:     Comments: R-BKA edematous with wound VAC  LLE edema  Neurological: She is alert and oriented to person, place, and time.  Motor: B/l UE, LLE, Right hip 5/5 proximal to distal Sensation intact to light touch  Skin: Skin is warm and dry.  +VAC  Psychiatric: She has a normal mood and affect. Her behavior is normal.    Assessment/Plan: 1. 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  Physiatrist is providing close team supervision and 24 hour management  of active medical problems listed below.  Physiatrist and rehab team continue to assess barriers to discharge/monitor patient progress toward functional and medical goals  Care Tool:  Bathing    Body parts bathed by patient: Right arm, Left arm, Chest, Abdomen, Front perineal area, Buttocks, Right upper leg, Left upper leg, Left lower leg, Face     Body parts n/a: Right lower leg   Bathing assist       Upper Body Dressing/Undressing Upper body dressing   What is the patient wearing?: Pull over shirt    Upper body assist Assist Level: Set up assist    Lower Body Dressing/Undressing Lower body dressing      What is the patient wearing?: Pants     Lower body assist Assist for lower body dressing: Moderate Assistance - Patient 50 - 74%     Toileting Toileting    Toileting assist Assist for toileting: Moderate Assistance - Patient 50 - 74%     Transfers Chair/bed transfer  Transfers assist     Chair/bed transfer assist level: Minimal Assistance - Patient > 75%     Locomotion Ambulation   Ambulation assist      Assist level: 2 helpers Assistive device: Walker-rolling Max distance: 5'   Walk 10 feet activity   Assist  Walk 10 feet activity did not occur: Safety/medical concerns        Walk 50 feet activity   Assist Walk 50 feet  with 2 turns activity did not occur: Safety/medical concerns         Walk 150 feet activity   Assist Walk 150 feet activity did not occur: Safety/medical concerns         Walk 10 feet on uneven surface  activity   Assist Walk 10 feet on uneven surfaces activity did not occur: Safety/medical concerns         Wheelchair     Assist Will patient use wheelchair at discharge?: Yes Type of Wheelchair: Manual    Wheelchair assist level: Supervision/Verbal cueing Max wheelchair distance: 150'    Wheelchair 50 feet with 2 turns activity    Assist        Assist Level: Supervision/Verbal cueing    Wheelchair 150 feet activity     Assist      Assist Level: Supervision/Verbal cueing   Blood pressure (!) 145/90, pulse 100, temperature 98.5 F (36.9 C), temperature source Oral, resp. rate 18, height 5' 2" (1.575 m), weight 129.9 kg, SpO2 97 %.    Medical Problem List and Plan: 1.  Deficits with mobility and self-care secondary to right BKA.  Team conference today please see physician documentation under team conference tab, met with team face-to-face to discuss problems,progress, and goals. Formulized individual treatment plan based on medical history, underlying problem and comorbidities. 2.  Antithrombotics: -DVT/anticoagulation:  Pharmaceutical: Lovenox             -antiplatelet therapy: N/A 3. Pain Management: Oxycodone as needed.  We will add low-dose gabapentin to augment pain management/phantom symptoms. D/c dilaudid.   - pt reports pain is controlled currently. Took only 2 oxy IR yesterday              Monitor pain with increased activity. 4. Mood: LCSW to follow for evaluation and support             -antipsychotic agents: N/A 5. Neuropsych: This patient is capable of making decisions on on her own behalf. 6. Skin/Wound Care: Wound VAC changed to Prevena with ?plans to d/c after 7 days. Continue Prostat as well as protein supplements to help promote healing 7. Fluids/Electrolytes/Nutrition: Monitor I/O.  CMP ordered for tomorrow.  8.  Sepsis with bacteremia: Has been afebrile.  Continue ceftriaxone 2 g every 24 for 4 total weeks end date 03/04/19 9. T2DM: Hgb A1C- 10.8.  Has not been on medications for years and now aware of importance of compliance.  Will monitor blood sugars AC/at bedtime.  Continue Lantus insulin and titrate upwards as indicated.   -pt reports A1c was 14- brought down to 10.8 with diet changes             Monitor with increased mobility.  10. Nausea/vomiting: Has resolved with treatment of sepsis.  Continue PPI.  Denies issues with gastroparesis.   Will change Reglan to p.o. route and wean off as able. 11.  Mild tachycardia: We will monitor for now.  Blood pressures reasonable.             Monitor HR with increased exertion.  12.  Leukocytosis: Improving.  Continue to monitor for fevers or other signs of infection.  WBC 11.9- improving  13.  Anemia: Iron deficiency with low folate levels and elevated ferritin.  Add folic acid.  Likely needs IV iron for supplementation         Hgb lower but will hold off on transfusion unless below 7 or if pt symptomatic, check orthovitals 14.  Hyponatremia: We will continue   to monitor--should improve as N/V resolved and intake improving.   Na improved to 133 15.  Morbid obesity with malnutrition:  Protein supplements to promote wound healing.  Educated patient on importance of appropriate diet and weight loss to help promote health and activity.  Will consult dietitian for diet education. 16. Hypocalcemia  Ca 7.7- will start on repletion. 17.  Urinary retention requiring ICP, likely post op retention due to immobility in a pt with diabetic cystopathy- trial urecholine and flomax LOS: 2 days A FACE TO FACE EVALUATION WAS PERFORMED   E  02/11/2019, 7:23 AM    

## 2019-02-11 NOTE — Progress Notes (Signed)
Patient tried multiple times to void but was unsuccessful. Her bladder was scanned for over 1033ml. She was straight cathed for 1138ml, giving her relief. No acute distress noted, will continue to monitor

## 2019-02-11 NOTE — Progress Notes (Signed)
Physical Therapy Session Note  Patient Details  Name: Sheila Austin MRN: [id number] Date of Birth: 30-Sep-1985  Today's Date: 02/11/2019 PT Individual Time: 0805-0915 PT Individual Time Calculation (min): 70 min   Short Term Goals: Week 1:  PT Short Term Goal 1 (Week 1): = LTGs  Skilled Therapeutic Interventions/Progress Updates: Pt presented in bed agreeable to therapy. Pt stating just received ms relaxer has pain but did not rate. Pt performed LB dressing in bed with set up and increased time. Pt performed rolling L/R mod I with use of bed rails to pull pants/underware over hips. Performed supine to sit with use of bed features and supervision. Performed lateral scoot transfer to w/c with CGA. Due to height pt was unable to touch floor therefore used on BUE. Pt propelled x 158f to rehab gym with increased exertion noted and x 2 rest breaks. Pt transported remaining distance to day room. Instructed pt in unlocking leg rests which pt was able to perform with minA. Performed lateral scoot to mat in same manner as prior. Pt participated in supine therex as follows. SLR, hip abd/add, SAQ, bridges with bolster 2 x 10, transferred to sitting mod I with increased time and performed LAQ 2 x 10. Pt performed STS with RW x 2 with CGA. While in standing pt performed hip flexion/extension 2 x 10. Pt then performed stand pivot transfer to w/c (obtained 20x 16 vs 20x18 that she was previously in). Pt was able to propel back to room supervision without rest. Pt remained in w/c at end of session with seat alarm on, call bell within reach and needs met.      Therapy Documentation Precautions:  Precautions Precautions: Fall Precaution Comments: wound VAC Required Braces or Orthoses: Other Brace Other Brace: R residual limb protector- does not fit Restrictions Weight Bearing Restrictions: Yes RLE Weight Bearing: Non weight bearing General:   Vital Signs: Therapy Vitals Temp: 98.3 F (36.8 C) Temp  Source: Oral Pulse Rate: 100 BP: (!) 148/92 Patient Position (if appropriate): Lying Oxygen Therapy SpO2: 100 %    Therapy/Group: Individual Therapy  Emile Ringgenberg  Gumecindo Hopkin, PTA  02/11/2019, 4:14 PM

## 2019-02-11 NOTE — Evaluation (Signed)
Recreational Therapy Assessment and Plan  Patient Details  Name: Sheila Austin MRN: [id number] Date of Birth: 1985-08-09 Today's Date: 02/11/2019  Rehab Potential: Good ELOS: 10/15   Assessment  Problem List:      Patient Active Problem List   Diagnosis Date Noted  . Unilateral complete BKA, right, initial encounter ([place]) 02/09/2019  . Acquired absence of right leg below knee ([place])   . Hyponatremia   . Acute blood loss anemia   . Sinus tachycardia   . Post-operative pain   . Bacteremia   . Sepsis with acute organ dysfunction without septic shock ([place])   . 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 Streptococcal bacteremia   . Klebsiella pneumoniae infection   . Abscess of leg, right   . Skin ulcer of right foot with necrosis of muscle ([place])   . Severe protein-calorie malnutrition ([place])   . Diabetic foot ulcer with osteomyelitis ([place]) 02/01/2019  . Adjustment disorder 02/01/2019  . Sepsis due to cellulitis ([place]) 02/01/2019  . Obesity 12/31/2018  . Facial cellulitis 07/02/2018  . Class 3 severe obesity with body mass index (BMI) of 40.0 to 44.9 in adult ([place]) 07/09/2017  . Abscess of right breast 02/24/2016  . BMI 45.0-49.9, adult ([place]) 09/29/2015  . Skin lesions - right lower extremity 12/07/2013  . Morbid obesity with BMI of 50.0-59.9, adult ([place]) 10/05/2011  . Obstructive sleep apnea 10/05/2011  . Hypertriglyceridemia 09/27/2011  . Migraine 09/07/2011  . Essential hypertension, benign 11/01/2008  . Gastroparesis 09/27/2008  . IRREGULAR MENSES 08/24/2008  . Diabetes mellitus out of control ([place]) 07/04/2006    Past Medical History:      Past Medical History:  Diagnosis Date  . Diabetes mellitus    uncontrolled, last A1c was 14  . E. coli sepsis ([place])   . Ectopic pregnancy   . Essential hypertension, benign 11/01/2008  . Fatty liver   . Gastroparesis   . GERD (gastroesophageal reflux disease)   . Helicobacter pylori gastritis 09/07/2011   . Migraine   . Morbid obesity with body mass index (BMI) of 40.0 to 49.9 ([place])   . Pulmonary edema   . Pyelonephritis   . Ureteral obstruction    Past Surgical History:       Past Surgical History:  Procedure Laterality Date  . AMPUTATION Right 02/04/2019   Procedure: RIGHT BELOW KNEE AMPUTATION;  Surgeon: Newt Minion, MD;  Location: [place];  Service: Orthopedics;  Laterality: Right;  . ESOPHAGOGASTRODUODENOSCOPY  2010  . WOUND DEBRIDEMENT Right 01/03/2019   Procedure: DEBRIDEMENT WOUND RIGHT LATERAL FOOT;  Surgeon: Angelia Mould, MD;  [facility] OR;  Service: Vascular;  Laterality: Right;    Assessment & Plan Clinical Impression: Patient is a 33 y.o. year old female withhistory of T2DM, HTN, medical non-compliance for years, recent depressive episode due to loss of mother, morbid obesity , fatty liver who developed a blister this summer that ruptured with onset of left food ulcer treated with I &D 12/31/18 who left AMA post procedure due to family issues. History taken from chart review and patient.She continued to have poor wound healing despite conservative care and was admitted on 01/31/2019 due to progressive nausea, vomiting, drainage, foot pain, and weakness. She was found to be septic and treated with IVF as well broad spectrum antibiotics. MRI foot showed severe diffuse cellulitis with myofascitis, osteomyelitis of fifth metatarsal, cuboid and calcaneus as well as abscess extending of the peroneal tendon. Dr. Sharol Given consulted for input and  recommended IV antibiotics for another 24 hours followed by amputation. Blood cultures positive for strep intermedius and wound cultures showed Klebsiella pneumonia. ID recommended high dose Rocephin x4 weeks.   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 Sharol Given reported that patient had abscesses that extended through anterior and lateral compartment at level of amputation. Limb guard and stump  shrinker ordered by [employer]. Fever resolving with repeat blood cultures negative so far. Plans for PICC line placement on 02/07/2019. Hospital course further complicated by ABLA, with drop in hemoglobin on 10/03 to 6.6 and was transfused with I unit PRBC. Pain control improving and therapy evaluations completed revealing deficits in mobility as well as ability to carry out ADLs. CIR recommended due to functional decline. Please see preadmission assessment from earlier today as well. .  Patient transferred to CIR on 02/09/2019 .    Pt presents with decreased activity tolerance, decreased functional mobility, decreased balance Limiting pt's independence with leisure/community pursuits.   Leisure History/Participation Premorbid leisure interest/current participation: [employer];Community - Travel (Comment) Other Leisure Interests: Reading(writing; reading Bible) Leisure Participation Style: Alone;With Family/Friends Awareness of Community Resources: Good-identify 3 post discharge leisure resources Psychosocial / Spiritual Social interaction - Mood/Behavior: Cooperative Academic librarian Appropriate for Education?: Yes Strengths/Weaknesses Patient Strengths/Abilities: Willingness to participate;Active premorbidly Patient weaknesses: Physical limitations TR Patient demonstrates impairments in the following area(s): Edema;Endurance;Motor;Pain;Skin Integrity  Plan Rec Therapy Plan Is patient appropriate for Therapeutic Recreation?: Yes Rehab Potential: Good Treatment times per week: Min 1 TR session >20 minutes Estimated Length of Stay: 10/15 TR Treatment/Interventions: Adaptive equipment instruction;Community reintegration;Patient/family education;1:1 session;Functional mobility training;Recreation/leisure participation;Therapeutic activities;Wheelchair propulsion/positioning  Recommendations for other services: None   Discharge Criteria:  Patient will be discharged from TR if patient refuses treatment 3 consecutive times without medical reason.  If treatment goals not met, if there is a change in medical status, if patient makes no progress towards goals or if patient is discharged from hospital.  The above assessment, treatment plan, treatment alternatives and goals were discussed and mutually agreed upon: by patient  Gordon 02/11/2019, 2:17 PM

## 2019-02-11 NOTE — Progress Notes (Signed)
Occupational Therapy Session Note  Patient Details  Name: Sheila Austin MRN: [id number] Date of Birth: February 23, 1986  Today's Date: 02/11/2019 OT Individual Time: 1103-1200 OT Individual Time Calculation (min): 57 min    Short Term Goals: Week 1:  OT Short Term Goal 1 (Week 1): STGs=LTGs secondary to short estimated LOS  Skilled Therapeutic Interventions/Progress Updates:    Upon entering the room, pt seated in wheelchair with significant other present but getting ready to leave for the day. Pt with no c/o pain this session and agreeable to OT intervention. Pt verbalized being upset over request for pain medication and wanting to not use bed pan at night. OT reviewed these things and encouraged pt to be an advocate for her own care. OT also spoke with RN about these concerns. Pt requesting HEP for B UE strengthening with use of level 2 theraband. Pt also given paper handout of exercises for pt to refer to. Pt performed 2 sets of 10 chest pulls, shoulder diagonals, shoulder flexion, elbow flexion, and alternating punches. Pt a little tearful this session but motivated for OT intervention throughout. Call bell within reach upon exiting the room and pt remained in wheelchair.   Therapy Documentation Precautions:  Precautions Precautions: Fall Precaution Comments: wound VAC Required Braces or Orthoses: Other Brace Other Brace: R residual limb protector- does not fit Restrictions Weight Bearing Restrictions: Yes RLE Weight Bearing: Non weight bearing Pain: Pain Assessment Pain Scale: 0-10 Pain Score: 7  Pain Type: Surgical pain Pain Location: Leg Pain Orientation: Right Pain Descriptors / Indicators: Constant Pain Onset: On-going Patients Stated Pain Goal: 3 Pain Intervention(s): Medication (See eMAR) ADL:   Therapy/Group: Individual Therapy  [name] 02/11/2019, 12:32 PM

## 2019-02-11 NOTE — Progress Notes (Signed)
PHARMACY CONSULT NOTE FOR:  OUTPATIENT  PARENTERAL ANTIBIOTIC THERAPY (OPAT)  Indication: Deep wound infection/bacteremia Regimen: Ceftriaxone 2 gm IV Q 24 hours End date: 03/04/2019  IV antibiotic discharge orders are pended. To discharging provider:  please sign these orders via discharge navigator,  Select New Orders & click on the button choice - Manage This Unsigned Work.     Thank you for allowing pharmacy to be a part of this patient's care.  Jimmy Footman, PharmD, BCPS, [place] [facility] Clinical Pharmacist Phone: [phone] 02/11/2019, 10:39 AM

## 2019-02-11 NOTE — Patient Care Conference (Signed)
Inpatient RehabilitationTeam Conference and Plan of Care Update Date: 02/11/2019   Time: 10;25 AM    Patient Name: Sheila Austin      Medical Record Number: [id number]  Date of Birth: 1985/06/26 Sex: Female         Room/Bed: 4M03C/4M03C-01 Payor Info: Payor: [employer] / Plan: [place] [employer] / Product Type: *No Product type* /    Admit Date/Time:  02/09/2019  3:15 PM  Primary Diagnosis:  Unilateral complete BKA, right, initial encounter [facility])  Patient Active Problem List   Diagnosis Date Noted  . Unilateral complete BKA, right, initial encounter ([place]) 02/09/2019  . Acquired absence of right leg below knee ([place])   . Hyponatremia   . Acute blood loss anemia   . Sinus tachycardia   . Post-operative pain   . Bacteremia   . Sepsis with acute organ dysfunction without septic shock ([place])   . 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[place])   . Streptococcal bacteremia   . Klebsiella pneumoniae infection   . Abscess of leg, right   . Skin ulcer of right foot with necrosis of muscle ([place])   . Severe protein-calorie malnutrition ([place])   . Diabetic foot ulcer with osteomyelitis ([place]) 02/01/2019  . Adjustment disorder 02/01/2019  . Sepsis due to cellulitis ([place]) 02/01/2019  . Obesity 12/31/2018  . Facial cellulitis 07/02/2018  . Class 3 severe obesity with body mass index (BMI) of 40.0 to 44.9 in adult ([place]) 07/09/2017  . Abscess of right breast 02/24/2016  . BMI 45.0-49.9, adult ([place]) 09/29/2015  . Skin lesions - right lower extremity 12/07/2013  . Morbid obesity with BMI of 50.0-59.9, adult ([place]) 10/05/2011  . Obstructive sleep apnea 10/05/2011  . Hypertriglyceridemia 09/27/2011  . Migraine 09/07/2011  . Essential hypertension, benign 11/01/2008  . Gastroparesis 09/27/2008  . IRREGULAR MENSES 08/24/2008  . Diabetes mellitus out of control ([place]) 07/04/2006    Expected Discharge Date: Expected Discharge Date: 02/19/19  Team Members Present: Physician leading conference: Dr.  Courtney Heys Social Worker Present: Ovidio Kin, LCSW Nurse Present: Judee Clara, LPN PT Present: Barrie Folk, PT;Rosita Dechalus, PTA OT Present: Darleen Crocker, OT PPS Coordinator present : Gunnar Fusi, SLP     Current Status/Progress Goal Weekly Team Focus  Bowel/Bladder   continent of bowel & bladder, LBM 10/6  remain continent  assist as needed & monitor for changes   Swallow/Nutrition/ Hydration             ADL's   min A functional transfers, set up A UB, mod A LB self care  min A - Mod I (w/c level)  functional transfers, self care tasks, balance, endurance, strengthening   Mobility   min assist transfers. Gait with RW up to 44ft with min-mod assist. supervision assist bed mobility and WC mobility  Mod I overall at [facility] level. With CGA gait for short distance.  safety with transfers. gait, and WC mobility   Communication             Safety/Cognition/ Behavioral Observations            Pain   c/o pain to the right stump, has oxycodone, tylenol & robaxin prn, neurontin is scheduled  pain scale <4/10  assess & treat as needed   Skin   right BKA stump with wound vac intact  no new areas of break down  assess q shift      *See Care Plan and progress notes for long and short-term goals.  Barriers to Discharge  Current Status/Progress Possible Resolutions Date Resolved   Nursing                  PT  Inaccessible home environment;Medication compliance  has 2 STE but likely will have a ramp installed - may not be ready for d/c and can teach bumping up in w/c              OT Other (comments)  none known at this time             SLP                SW                Discharge Planning/Teaching Needs:  Home with fiance who has taken some time off to assist and her sister in-law is next door if needed. Pt really wants to be mod/i at DC      Team Discussion:  Goals supervision-mod/i level. Currently min-mod level. Pain and knee buckling are her issues. Trying to  work through issues and pain. Would benefit from seeing neuro-psych while here.  Revisions to Treatment Plan:  DC 10/15    Medical Summary Current Status: Below-knee amputation with wound VAC, no severe postoperative pain, urinary retention requiring ICP, diabetes control is fair Weekly Focus/Goal: Initiate rehab program, work on wound care as well as resolving urinary retention  Barriers to Discharge: IV antibiotics;Medical stability;Medication compliance;Neurogenic Bowel & Bladder  Barriers to Discharge Comments: No pre-existing problems with bladder, will be on IV Rocephin throughout inpatient stay Possible Resolutions to Barriers: ICP, trial of Flomax and Urecholine   Continued Need for Acute Rehabilitation Level of Care: The patient requires daily medical management by a physician with specialized training in physical medicine and rehabilitation for the following reasons: Direction of a multidisciplinary physical rehabilitation program to maximize functional independence : Yes Medical management of patient stability for increased activity during participation in an intensive rehabilitation regime.: Yes Analysis of laboratory values and/or radiology reports with any subsequent need for medication adjustment and/or medical intervention. : Yes   I attest that I was present, lead the team conference, and concur with the assessment and plan of the team. Teleconference held due to COVID 19   Zonya Gudger, Gardiner Rhyme 02/12/2019, 2:48 PM

## 2019-02-12 ENCOUNTER — Inpatient Hospital Stay (HOSPITAL_COMMUNITY): Payer: 59 | Admitting: *Deleted

## 2019-02-12 ENCOUNTER — Inpatient Hospital Stay (HOSPITAL_COMMUNITY): Payer: 59 | Admitting: Physical Therapy

## 2019-02-12 ENCOUNTER — Inpatient Hospital Stay (HOSPITAL_COMMUNITY): Payer: 59 | Admitting: Occupational Therapy

## 2019-02-12 LAB — GLUCOSE, CAPILLARY
Glucose-Capillary: 131 mg/dL — ABNORMAL HIGH (ref 70–99)
Glucose-Capillary: 93 mg/dL (ref 70–99)
Glucose-Capillary: 100 mg/dL — ABNORMAL HIGH (ref 70–99)
Glucose-Capillary: 112 mg/dL — ABNORMAL HIGH (ref 70–99)

## 2019-02-12 MED ORDER — CHLORHEXIDINE GLUCONATE CLOTH 2 % EX PADS: 6.0000 | MEDICATED_PAD | Freq: Every day | CUTANEOUS | Status: DC

## 2019-02-12 MED ORDER — SODIUM CHLORIDE 0.9% FLUSH
10.0000 mL | INTRAVENOUS | Status: DC | PRN
  Administered 2019-02-15: 10 mL
  Filled 2019-02-12: qty 40

## 2019-02-12 MED ORDER — SODIUM CHLORIDE 0.9% FLUSH
10.0000 mL | Freq: Two times a day (BID) | INTRAVENOUS | Status: DC
  Administered 2019-02-17 – 2019-02-19 (×2): 10 mL

## 2019-02-12 NOTE — Progress Notes (Signed)
Physical Therapy Session Note  Patient Details  Name: Sheila Austin MRN: [id number] Date of Birth: Jul 02, 1985  Today's Date: 02/12/2019  AM Session  PT Individual Time: 0805-0900 PT Individual Time Calculation (min): 55 min   PM Session Time: 16:30-17:30 Time calculation: 60 min  Short Term Goals: Week 1:  PT Short Term Goal 1 (Week 1): = LTGs  Skilled Therapeutic Interventions/Progress Updates:  AM Session  Pt upright in wheelchair upon arrival for therapy. Pt agreeable to treatment. Pt self propelled wheelchair about 75 feet from room>therapy gym. Pt performed sit>stand and gait in the parallel bars, 3-4 steps forward, 3-4 steps reverse x2 with minA. Pt required verbal cueing for use of the UEs to advance forward and reverse. Pt returned to wheelchair. Pt performed sit>stand and ambulated 20 feetx3 with RW and minA-CGA. Pt required verbal cueing for sit>stand and ambulation technique and took seated rest breaks in between each bout of gait. Pt performed mat level exercises (all 2x10): LAQ, SAQ, isometric hip extension/bridging. Pt performed sit>stand and ambulated 6 feet with RW and CGA. Pt tolerated standing with RW and CGA while play bean bag toss placed about 6 feet away. Pt reached into bucket of bean bags with L UE (dominant side) and tossed 15 bean bagsx2 towards the board. Pt took seated rest breaks in between each set. Pt returned to wheelchair and self propelled chair about 75 feet from therapy gym>room with supervision. Pt left sitting upright in chair with call bell within reach. All needs met at this time.   PM Session  Pt upright in wheelchair with fiance present in room upon arrival for therapy. Pt agreeable to treatment. Pt self propelled wheelchair from room>small therapy gym about 75 feet with supervision. Pt completed 10 minutes on the UBE, 5 minutes at Level 5.0 and 5 minutes at Level 6.0. Pt transported to other therapy gym. Pt transferred via stand pivot with the RW and  minA-CGA to the mat table. While sitting EOB, pt completed rows with orange theraband, biceps curl and overhead press with 5# weighted bar, and lat pull downs with 5# weighted bar and orange theraband attached (all 4x10 with rest breaks prn d/t fatigue). Pt required demonstration and verbal cueing for proper form and technique of exercises. Pt transferred via stand pivot with RW and minA-CGA back to wheelchair. Pt completed 2x10 seated wheelchair pushups. Pt self propelled chair about 75 feet with supervision from therapy gym>room. Pt left sitting upright in chair with call bell within reach. NT present refilling water pitcher. All needs met at this time.     Therapy Documentation Precautions:  Precautions Precautions: Fall Precaution Comments: wound VAC Required Braces or Orthoses: Other Brace Other Brace: R residual limb protector- does not fit Restrictions Weight Bearing Restrictions: Yes RLE Weight Bearing: Non weight bearing  Pain: denies pain    Therapy/Group: Individual Therapy  Olena Leatherwood, SPT  02/12/2019, 5:29 PM

## 2019-02-12 NOTE — IPOC Note (Signed)
Overall Plan of Care [name]) Patient Details Name: Sheila Austin MRN: [id number] DOB: August 28, 1985  Admitting Diagnosis: Unilateral complete BKA, right, initial encounter [name])  Hospital Problems: Principal Problem:   Unilateral complete BKA, right, initial encounter ([name]) Active Problems:   Diabetes mellitus out of control ([name])   Morbid obesity with BMI of 50.0-59.9, adult ([name])   Sepsis due to cellulitis ([name])   Severe protein-calorie malnutrition ([place])     Functional Problem List: Nursing    PT Balance, Edema, Endurance, Motor, Pain, Safety, Skin Integrity, Sensory  OT Balance, Endurance, Motor, Pain, Safety  SLP    TR Edema, Endurance, Motor, Pain, Skin Integrity       Basic ADL's: OT Grooming, Bathing, Dressing, Toileting     Advanced  ADL's: OT Simple Meal Preparation     Transfers: PT Bed Mobility, Bed to Chair, Car, Manufacturing systems engineer, Metallurgist: PT Ambulation, Emergency planning/management officer, Stairs     Additional Impairments: OT None  SLP        TR      Anticipated Outcomes Item Anticipated Outcome  Self Feeding n/a  Swallowing      Basic self-care  mod I  Toileting  min A   Bathroom Transfers mod I  Bowel/Bladder     Transfers  mod I basic transfers; supervision car  Locomotion  mod I w/c mobility; CGA short distance gait  Communication     Cognition     Pain     Safety/Judgment      Therapy Plan: PT Intensity: Minimum of 1-2 x/day ,45 to 90 minutes PT Frequency: 5 out of 7 days PT Duration Estimated Length of Stay: 7-10 days OT Intensity: Minimum of 1-2 x/day, 45 to 90 minutes OT Frequency: 5 out of 7 days OT Duration/Estimated Length of Stay: 7-10 days     Due to the current state of emergency, patients may not be receiving their 3-hours of Medicare-mandated therapy.   Team Interventions: Nursing Interventions    PT interventions Ambulation/gait training, Training and development officer, Community reintegration,  Discharge planning, Disease management/prevention, DME/adaptive equipment instruction, Functional electrical stimulation, Functional mobility training, Neuromuscular re-education, Pain management, Patient/family education, Psychosocial support, Skin care/wound management, Splinting/orthotics, Stair training, Therapeutic Activities, Therapeutic Exercise, UE/LE Strength taining/ROM, UE/LE Coordination activities, Wheelchair propulsion/positioning  OT Interventions Training and development officer, Engineer, drilling, Patient/family education, Therapeutic Activities, Wheelchair propulsion/positioning, Functional electrical stimulation, Psychosocial support, Therapeutic Exercise, Community reintegration, Functional mobility training, UE/LE Strength taining/ROM, Discharge planning, Neuromuscular re-education, Skin care/wound managment, UE/LE Coordination activities, Disease mangement/prevention, Pain management  SLP Interventions    TR Interventions Adaptive equipment instruction, Community reintegration, Barrister's clerk education, 1:1 session, Functional mobility training, Recreation/leisure participation, Therapeutic activities, Wheelchair propulsion/positioning  SW/CM Interventions Psychosocial Support, Discharge Planning, Patient/Family Education   Barriers to Discharge MD  Medical stability, IV antibiotics, Weight and Medication compliance  Nursing      PT Inaccessible home environment, Medication compliance has 2 STE but likely will have a ramp installed - may not be ready for d/c and can teach bumping up in w/c  OT Other (comments) none known at this time  SLP      SW       Team Discharge Planning: Destination: PT-Home ,OT- Home , SLP-  Projected Follow-up: PT-Home health PT, OT-  24 hour supervision/assistance, Outpatient OT, SLP-  Projected Equipment Needs: PT-Wheelchair cushion (measurements), Wheelchair (measurements), Rolling walker with 5" wheels, OT- To be determined, SLP-   Equipment Details: PT- , OT-  Patient/family involved in  discharge planning: PT- Patient,  OT-Patient, SLP-   MD ELOS: 7-10d3 Medical Rehab Prognosis:  Excellent Assessment:  33 year old female with history of T2DM,  HTN, medical non-compliance for years, recent depressive episode due to loss of mother, morbid obesity , fatty liver who developed a blister this summer that ruptured with onset of left food ulcer treated with I & D 12/31/18 who left AMA post procedure due to family issues. History taken from chart review and patient. She continued to have poor wound healing despite conservative care and was admitted on  01/31/2019 due to progressive nausea, vomiting, drainage, foot pain, and weakness. She was found to be septic and treated with IVF as well broad spectrum antibiotics. MRI foot showed severe diffuse cellulitis with myofascitis, osteomyelitis of fifth metatarsal, cuboid and calcaneus as well as abscess extending of the peroneal tendon.  Dr. Sharol Given consulted for input and recommended IV antibiotics for another 24 hours followed by amputation.  Blood cultures positive for strep intermedius and wound cultures showed Klebsiella pneumonia.  ID recommended high dose Rocephin x4 weeks.   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 Sharol Given reported that patient had abscesses that extended through anterior and lateral compartment at level of amputation. Limb guard and stump shrinker ordered by [employer].  Fever resolving with repeat blood cultures negative so far.  Plans for PICC line placement on 02/07/2019. Hospital course further complicated by ABLA, with drop in hemoglobin on 10/03 to 6.6 and was transfused with I unit PRBC.  Pain control improving and therapy evaluations completed revealing deficits in mobility as well as ability to carry out ADLs.  CIR recommended due to functional decline. Please see preadmission assessment from earlier today as well.    Now requiring 24/7  Rehab RN,MD, as well as CIR level PT, OT and SLP.  Treatment team will focus on ADLs and mobility with goals set at Mod I See Team Conference Notes for weekly updates to the plan of care

## 2019-02-12 NOTE — Progress Notes (Signed)
[facility] PHYSICAL MEDICINE & REHABILITATION PROGRESS NOTE   Subjective/Complaints:  No issues overnite, discussed low Hgb, pt denies blood in stool or urine   ROS- no bowel problems, no CP or SOB  Objective:   [place] Ekg Site Rite  Result Date: 02/11/2019 If Site Rite image not attached, placement could not be confirmed due to current cardiac rhythm.  Recent Labs    02/09/19 1640 02/11/19 0443  WBC 15.9* 11.9*  HGB 7.9* 7.2*  HCT 25.4* 24.0*  PLT 634* 598*   Recent Labs    02/09/19 1640 02/11/19 0443  NA 133* 136  K 4.6 5.0  CL 101 102  CO2 24 25  GLUCOSE 168* 141*  BUN 20 22*  CREATININE 1.07* 0.88  CALCIUM 7.7* 8.4*    Intake/Output Summary (Last 24 hours) at 02/12/2019 [id number] Last data filed at 02/11/2019 1750 Gross per 24 hour  Intake 358 ml  Output -  Net 358 ml     Physical Exam: Vital Signs Blood pressure (!) 143/92, pulse 95, temperature 97.9 F (36.6 C), temperature source Oral, resp. rate 19, height 5\' 2"  (1.575 m), weight 129.9 kg, SpO2 96 %.   Nursing note and vitals reviewed. Constitutional: awake, alert, appropriate, sitting up in manual w/c in room; conversant (works at [employer]), NAD Morbidly obese  HENT: very bad dentition/broken front teeth Head: Normocephalic and atraumatic.  Eyes: conjugate gaze Neck: No tracheal deviation present. No thyromegaly present.  Card- RRR Respiratory: Effort normal. No respiratory distress. CTA B/L  GI: soft, NT, ND, (+)BS  Musculoskeletal:     Comments: R-BKA edematous with wound VAC  LLE edema  Neurological: She is alert and oriented to person, place, and time.  Motor: B/l UE, LLE, Right hip 5/5 proximal to distal Sensation intact to light touch  Skin: Skin is warm and dry.  +VAC  Psychiatric: She has a normal mood and affect. Her behavior is normal.    Assessment/Plan: 1. Functional deficits secondary to R BKA which require 3+ hours per day of interdisciplinary therapy in a comprehensive  inpatient rehab setting.  Physiatrist is providing close team supervision and 24 hour management of active medical problems listed below.  Physiatrist and rehab team continue to assess barriers to discharge/monitor patient progress toward functional and medical goals  Care Tool:  Bathing    Body parts bathed by patient: Right arm, Left arm, Chest, Abdomen, Front perineal area, Buttocks, Right upper leg, Left upper leg, Left lower leg, Face     Body parts n/a: Right lower leg   Bathing assist       Upper Body Dressing/Undressing Upper body dressing   What is the patient wearing?: Pull over shirt    Upper body assist Assist Level: Set up assist    Lower Body Dressing/Undressing Lower body dressing      What is the patient wearing?: Pants     Lower body assist Assist for lower body dressing: Moderate Assistance - Patient 50 - 74%     Toileting Toileting    Toileting assist Assist for toileting: Moderate Assistance - Patient 50 - 74%     Transfers Chair/bed transfer  Transfers assist     Chair/bed transfer assist level: Minimal Assistance - Patient > 75%     Locomotion Ambulation   Ambulation assist      Assist level: Minimal Assistance - Patient > 75% Assistive device: Walker-rolling Max distance: 12   Walk 10 feet activity   Assist  Walk 10 feet activity  did not occur: Safety/medical concerns  Assist level: Minimal Assistance - Patient > 75% Assistive device: Walker-rolling   Walk 50 feet activity   Assist Walk 50 feet with 2 turns activity did not occur: Safety/medical concerns         Walk 150 feet activity   Assist Walk 150 feet activity did not occur: Safety/medical concerns         Walk 10 feet on uneven surface  activity   Assist Walk 10 feet on uneven surfaces activity did not occur: Safety/medical concerns         Wheelchair     Assist Will patient use wheelchair at discharge?: Yes Type of Wheelchair:  Manual    Wheelchair assist level: Supervision/Verbal cueing Max wheelchair distance: 75    Wheelchair 50 feet with 2 turns activity    Assist        Assist Level: Supervision/Verbal cueing   Wheelchair 150 feet activity     Assist      Assist Level: Supervision/Verbal cueing   Blood pressure (!) 143/92, pulse 95, temperature 97.9 F (36.6 C), temperature source Oral, resp. rate 19, height 5\' 2"  (1.575 m), weight 129.9 kg, SpO2 96 %.    Medical Problem List and Plan: 1.  Deficits with mobility and self-care secondary to right BKA. CIR PT, OT 2.  Antithrombotics: -DVT/anticoagulation:  Pharmaceutical: Lovenox             -antiplatelet therapy: N/A 3. Pain Management: Oxycodone as needed.  We will add low-dose gabapentin to augment pain management/phantom symptoms. D/c dilaudid.   - pt reports pain is controlled currently. Took only 2 oxy IR yesterday              Monitor pain with increased activity. 4. Mood: LCSW to follow for evaluation and support             -antipsychotic agents: N/A 5. Neuropsych: This patient is capable of making decisions on on her own behalf. 6. Skin/Wound Care: Wound VAC changed to [place] with ?plans to d/c after 7 days. Continue Prostat as well as protein supplements to help promote healing 7. Fluids/Electrolytes/Nutrition: Monitor I/O.  CMP ordered for tomorrow.  8.  Sepsis with bacteremia: Has been afebrile.  Continue ceftriaxone 2 g every 24 for 4 total weeks end date 03/04/19 9. T2DM: Hgb A1C- 10.8.  Has not been on medications for years and now aware of importance of compliance.  Will monitor blood sugars AC/at bedtime.  Continue Lantus insulin and titrate upwards as indicated.   -pt reports A1c was 14- brought down to 10.8 with diet changes CBG (last 3)  Recent Labs    02/11/19 1742 02/11/19 2057 02/12/19 0610  GLUCAP 126* 121* 131*  good control 10. Nausea/vomiting: Has resolved with treatment of sepsis.  Continue PPI.   Denies issues with gastroparesis.  Will change Reglan to p.o. route and wean off as able. 11.  Mild tachycardia: We will monitor for now.  Blood pressures reasonable.             Monitor HR with increased exertion.  12.  Leukocytosis: Improving.  Continue to monitor for fevers or other signs of infection.  WBC 11.9- improving  13.  Anemia: Iron deficiency with low folate levels and elevated ferritin.  Add folic acid.  Likely needs IV iron for supplementation         Hgb lower but will hold off on transfusion unless below 7 or if pt symptomatic, check orthovitals, recheck  CBC in am 14.  Hyponatremia: We will continue to monitor--should improve as N/V resolved and intake improving.   Na improved to 133 15.  Morbid obesity with malnutrition:  Protein supplements to promote wound healing.  Educated patient on importance of appropriate diet and weight loss to help promote health and activity.  Will consult dietitian for diet education. 16. Hypocalcemia  Ca 7.7- will start on repletion. 17.  Urinary retention improved after urecholine , was emptying prior to flomax  LOS: 3 days A FACE TO FACE EVALUATION WAS PERFORMED  Charlett Blake 02/12/2019, 6:27 AM

## 2019-02-12 NOTE — Progress Notes (Signed)
Peripherally Inserted Central Catheter/Midline Placement  The IV Nurse has discussed with the patient and/or persons authorized to consent for the patient, the purpose of this procedure and the potential benefits and risks involved with this procedure.  The benefits include less needle sticks, lab draws from the catheter, and the patient may be discharged home with the catheter. Risks include, but not limited to, infection, bleeding, blood clot (thrombus formation), and puncture of an artery; nerve damage and irregular heartbeat and possibility to perform a PICC exchange if needed/ordered by physician.  Alternatives to this procedure were also discussed.  Bard Power PICC patient education guide, fact sheet on infection prevention and patient information card has been provided to patient /or left at bedside.    PICC/Midline Placement Documentation  PICC Single Lumen 02/12/19 PICC Right Cephalic 38 cm 0 cm (Active)  Indication for Insertion or Continuance of Line Home intravenous therapies (PICC only) 02/12/19 2229  Exposed Catheter (cm) 0 cm 02/12/19 2229  Site Assessment Clean;Dry;Intact 02/12/19 2229  Line Status Saline locked;Flushed;Blood return noted 02/12/19 2229  Dressing Type Transparent 02/12/19 2229  Dressing Status Clean;Dry;Intact;Antimicrobial disc in place 02/12/19 2229  Dressing Change Due 02/19/19 02/12/19 2229       Gordan Payment 02/12/2019, 10:30 PM

## 2019-02-12 NOTE — Progress Notes (Signed)
Occupational Therapy Session Note  Patient Details  Name: Sheila Austin MRN: [id number] Date of Birth: June 09, 1985  Today's Date: 02/12/2019 OT Individual Time: [id number] OT Individual Time Calculation (min): 73 min    Short Term Goals: Week 1:  OT Short Term Goal 1 (Week 1): STGs=LTGs secondary to short estimated LOS  Skilled Therapeutic Interventions/Progress Updates:    Upon entering the room, pt seated in wheelchair awaiting OT arrival. Pt reports medications managed well for pain and was able to void on commode this morning. Pt propelling wheelchair to clean clothing and placed all items in lap to carry via wheelchair to sink. Pt washing hair and bathing UB with supervision overall. OT provided pt with LH reacher this session to increase independence with LB dressing tasks. Pt managed leg rests independently and standing with min A and use of RW to pushing clothing down hips. Pt utilized reacher to doff fully and able to thread wound vac into clean pants with min verbal cuing. Pt utilized reacher to thread L foot into pants first and then R residual limb. Min A sit <>stand and min A to pull pants over B hips secondary to body habitus. Close supervision - min guard for balance while standing. Pt returned to wheelchair awaiting next session. Call bell and all needed items within reach.   Therapy Documentation Precautions:  Precautions Precautions: Fall Precaution Comments: wound VAC Required Braces or Orthoses: Other Brace Other Brace: R residual limb protector- does not fit Restrictions Weight Bearing Restrictions: Yes RLE Weight Bearing: Non weight bearing   Therapy/Group: Individual Therapy  [name] 02/12/2019, 11:46 AM

## 2019-02-13 ENCOUNTER — Inpatient Hospital Stay (HOSPITAL_COMMUNITY): Payer: 59 | Admitting: Occupational Therapy

## 2019-02-13 ENCOUNTER — Inpatient Hospital Stay (HOSPITAL_COMMUNITY): Payer: 59 | Admitting: Physical Therapy

## 2019-02-13 ENCOUNTER — Inpatient Hospital Stay (HOSPITAL_COMMUNITY): Payer: 59 | Admitting: *Deleted

## 2019-02-13 LAB — BASIC METABOLIC PANEL
Anion gap: 11 (ref 5–15)
BUN: 17 mg/dL (ref 6–20)
CO2: 25 mmol/L (ref 22–32)
Calcium: 9 mg/dL (ref 8.9–10.3)
Chloride: 98 mmol/L (ref 98–111)
Creatinine, Ser: 0.92 mg/dL (ref 0.44–1.00)
GFR calc Af Amer: >60 mL/min (ref >60)
GFR calc non Af Amer: >60 mL/min (ref >60)
Glucose, Bld: 151 mg/dL — ABNORMAL HIGH (ref 70–99)
Potassium: 4.9 mmol/L (ref 3.5–5.1)
Sodium: 134 mmol/L — ABNORMAL LOW (ref 135–145)

## 2019-02-13 LAB — CBC WITH DIFFERENTIAL/PLATELET
Abs Immature Granulocytes: 0.12 10*3/uL — ABNORMAL HIGH (ref 0.00–0.07)
Basophils Absolute: 0.1 10*3/uL (ref 0.0–0.1)
Basophils Relative: 1 %
Eosinophils Absolute: 0.3 10*3/uL (ref 0.0–0.5)
Eosinophils Relative: 3 %
HCT: 27.4 % — ABNORMAL LOW (ref 36.0–46.0)
Hemoglobin: 8.2 g/dL — ABNORMAL LOW (ref 12.0–15.0)
Immature Granulocytes: 1 %
Lymphocytes Relative: 22 %
Lymphs Abs: 2.3 10*3/uL (ref 0.7–4.0)
MCH: 26.7 pg (ref 26.0–34.0)
MCHC: 29.9 g/dL — ABNORMAL LOW (ref 30.0–36.0)
MCV: 89.3 fL (ref 80.0–100.0)
Monocytes Absolute: 0.7 10*3/uL (ref 0.1–1.0)
Monocytes Relative: 7 %
Neutro Abs: 6.8 10*3/uL (ref 1.7–7.7)
Neutrophils Relative %: 66 %
Platelets: 664 10*3/uL — ABNORMAL HIGH (ref 150–400)
RBC: 3.07 MIL/uL — ABNORMAL LOW (ref 3.87–5.11)
RDW: 14.2 % (ref 11.5–15.5)
WBC: 10.3 10*3/uL (ref 4.0–10.5)
nRBC: 0 % (ref 0.0–0.2)

## 2019-02-13 LAB — GLUCOSE, CAPILLARY
Glucose-Capillary: 147 mg/dL — ABNORMAL HIGH (ref 70–99)
Glucose-Capillary: 141 mg/dL — ABNORMAL HIGH (ref 70–99)
Glucose-Capillary: 124 mg/dL — ABNORMAL HIGH (ref 70–99)
Glucose-Capillary: 141 mg/dL — ABNORMAL HIGH (ref 70–99)

## 2019-02-13 LAB — OCCULT BLOOD X 1 CARD TO LAB, STOOL: Fecal Occult Bld: NEGATIVE

## 2019-02-13 MED ORDER — SODIUM CHLORIDE 0.9 % IV SOLN: 2.0000 g | INTRAVENOUS | 0 refills | Status: DC

## 2019-02-13 MED ORDER — ALUM & MAG HYDROXIDE-SIMETH 200-200-20 MG/5ML PO SUSP: 30.0000 mL | ORAL | 0 refills | Status: DC | PRN

## 2019-02-13 MED ORDER — ACETAMINOPHEN 325 MG PO TABS: 325.0000 mg | ORAL_TABLET | ORAL | Status: DC | PRN

## 2019-02-13 MED ORDER — CHLORHEXIDINE GLUCONATE CLOTH 2 % EX PADS
6.0000 | MEDICATED_PAD | Freq: Two times a day (BID) | CUTANEOUS | Status: DC
  Administered 2019-02-13 – 2019-02-19 (×12): 6 via TOPICAL

## 2019-02-13 MED ORDER — CEFTRIAXONE IV (FOR PTA / DISCHARGE USE ONLY): 2.0000 g | INTRAVENOUS | 0 refills | Status: DC

## 2019-02-13 NOTE — Progress Notes (Addendum)
[facility] PHYSICAL MEDICINE & REHABILITATION PROGRESS NOTE   Subjective/Complaints:  No issues overnite except lateral stump pain where wound vac sponge is the thickest  No phantom pain  TIred this am took pain med 530am   ROS- no bowel problems, no CP or SOB  Objective:   [place] Ekg Site Rite  Result Date: 02/11/2019 If Site Rite image not attached, placement could not be confirmed due to current cardiac rhythm.  Recent Labs    02/11/19 0443 02/13/19 0550  WBC 11.9* 10.3  HGB 7.2* 8.2*  HCT 24.0* 27.4*  PLT 598* 664*   Recent Labs    02/11/19 0443 02/13/19 0550  NA 136 134*  K 5.0 4.9  CL 102 98  CO2 25 25  GLUCOSE 141* 151*  BUN 22* 17  CREATININE 0.88 0.92  CALCIUM 8.4* 9.0    Intake/Output Summary (Last 24 hours) at 02/13/2019 0732 Last data filed at 02/12/2019 1830 Gross per 24 hour  Intake 600 ml  Output -  Net 600 ml     Physical Exam: Vital Signs Blood pressure (!) 157/85, pulse (!) 108, temperature 98.7 F (37.1 C), temperature source Oral, resp. rate 16, height 5\' 2"  (1.575 m), weight 129.9 kg, SpO2 98 %.   Nursing note and vitals reviewed. Constitutional: awake, alert, appropriate, sitting up in manual w/c in room; conversant (works at [employer]), NAD Morbidly obese  HENT: very bad dentition/broken front teeth Head: Normocephalic and atraumatic.  Eyes: conjugate gaze Neck: No tracheal deviation present. No thyromegaly present.  Card- RRR Respiratory: Effort normal. No respiratory distress. CTA B/L  GI: soft, NT, ND, (+)BS  Musculoskeletal:     Comments: R-BKA edematous with wound VAC  LLE edema  Neurological: She is alert and oriented to person, place, and time.  Motor: B/l UE, LLE, Right hip 5/5 proximal to distal Sensation intact to light touch  Skin: Skin is warm and dry.  +VAC  Psychiatric: She has a normal mood and affect. Her behavior is normal.    Assessment/Plan: 1. 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  Physiatrist is providing close team supervision and 24 hour management of active medical problems listed below.  Physiatrist and rehab team continue to assess barriers to discharge/monitor patient progress toward functional and medical goals  Care Tool:  Bathing    Body parts bathed by patient: Right arm, Left arm, Chest, Abdomen, Front perineal area, Buttocks, Right upper leg, Left upper leg, Left lower leg, Face     Body parts n/a: Right lower leg   Bathing assist Assist Level: Contact Guard/Touching assist     Upper Body Dressing/Undressing Upper body dressing   What is the patient wearing?: Pull over shirt    Upper body assist Assist Level: Supervision/Verbal cueing    Lower Body Dressing/Undressing Lower body dressing      What is the patient wearing?: Underwear/pull up, Pants     Lower body assist Assist for lower body dressing: Minimal Assistance - Patient > 75%     Toileting Toileting    Toileting assist Assist for toileting: Moderate Assistance - Patient 50 - 74%     Transfers Chair/bed transfer  Transfers assist     Chair/bed transfer assist level: Contact Guard/Touching assist     Locomotion Ambulation   Ambulation assist      Assist level: Minimal Assistance - Patient > 75% Assistive device: Walker-rolling Max distance: 20  Walk 10 feet activity   Assist  Walk 10 feet activity did not occur: Safety/medical concerns  Assist level: Minimal Assistance - Patient > 75% Assistive device: Walker-rolling   Walk 50 feet activity   Assist Walk 50 feet with 2 turns activity did not occur: Safety/medical concerns         Walk 150 feet activity   Assist Walk 150 feet activity did not occur: Safety/medical concerns         Walk 10 feet on uneven surface  activity   Assist Walk 10 feet on uneven surfaces activity did not occur:  Safety/medical concerns         Wheelchair     Assist Will patient use wheelchair at discharge?: Yes Type of Wheelchair: Manual    Wheelchair assist level: Supervision/Verbal cueing Max wheelchair distance: 75 feet    Wheelchair 50 feet with 2 turns activity    Assist        Assist Level: Supervision/Verbal cueing   Wheelchair 150 feet activity     Assist      Assist Level: Supervision/Verbal cueing   Blood pressure (!) 157/85, pulse (!) 108, temperature 98.7 F (37.1 C), temperature source Oral, resp. rate 16, height 5\' 2"  (1.575 m), weight 129.9 kg, SpO2 98 %.    Medical Problem List and Plan: 1.  Deficits with mobility and self-care secondary to right BKA. CIR PT, OT 2.  Antithrombotics: -DVT/anticoagulation:  Pharmaceutical: Lovenox             -antiplatelet therapy: N/A 3. Pain Management: Oxycodone as needed.  We will add low-dose gabapentin to augment pain management/phantom symptoms. D/c dilaudid.   - pt reports pain is controlled currently. Took only 2 oxy IR yesterday              Monitor pain with increased activity. 4. Mood: LCSW to follow for evaluation and support             -antipsychotic agents: N/A 5. Neuropsych: This patient is capable of making decisions on on her own behalf. 6. Skin/Wound Care: Wound VAC changed to [place] with ?plans to d/c after 7 days. Continue Prostat as well as protein supplements to help promote healing 7. Fluids/Electrolytes/Nutrition: Monitor I/O.  CMP ordered for tomorrow.  8.  Sepsis with bacteremia: Has been afebrile.  Continue ceftriaxone 2 g every 24 for 4 total weeks end date 03/04/19 9. T2DM: Hgb A1C- 10.8.  Has not been on medications for years and now aware of importance of compliance.  Will monitor blood sugars AC/at bedtime.  Continue Lantus insulin and titrate upwards as indicated.   -pt reports A1c was 14- brought down to 10.8 with diet changes CBG (last 3)  Recent Labs    02/12/19 1735  02/12/19 2121 02/13/19 0617  GLUCAP 100* 112* 147*  good control 10/9 10. Nausea/vomiting: Has resolved with treatment of sepsis.  Continue PPI.  Denies issues with gastroparesis.  Will change Reglan to p.o. route and wean off as able. 11.  Mild tachycardia: We will monitor for now.  Blood pressures reasonable.             Monitor HR with increased exertion.  12.  Leukocytosis: resolved afeb 13.  Anemia: Iron deficiency with low folate levels and elevated ferritin.  Add folic acid.  Likely needs IV iron for supplementation         Hgb improved to 8.2 14.  Hyponatremia: We will continue to monitor--should improve as N/V resolved and intake  improving.   Na improved to 133 15.  Morbid obesity with malnutrition:  Protein supplements to promote wound healing.  Educated patient on importance of appropriate diet and weight loss to help promote health and activity.  Will consult dietitian for diet education. 16. Hypocalcemia  Ca 7.7- will start on repletion. 17.  Urinary retention improved after urecholine , was emptying prior to flomax dc flomax LOS: 4 days A FACE TO FACE EVALUATION WAS PERFORMED  Charlett Blake 02/13/2019, 7:32 AM

## 2019-02-13 NOTE — Progress Notes (Signed)
Occupational Therapy Session Note  Patient Details  Name: Sheila Austin MRN: [id number] Date of Birth: Jan 08, 1986  Today's Date: 02/13/2019 OT Individual Time: 1000-1100 & 1300-1345 OT Individual Time Calculation (min): 60 min & 45 min   Short Term Goals: Week 1:  OT Short Term Goal 1 (Week 1): STGs=LTGs secondary to short estimated LOS  Skilled Therapeutic Interventions/Progress Updates:    am session:  Patient seated in w/c, ready for therapy session - she is pleasant, cooperative and aware of needs.  Reviewed and practiced home management strategies in kitchen environment (safe reach and transport of items in w/c and with RW) she is able to reach into low cabinets and move items place to place seated in w/c with CS and cues.  CGS/min A for short distance ambulation and reach/transport in stance with RW.  She demonstrates a good understanding and verbalizes safe strategies.  Completed UB ergometer 2 x 5 minutes, able to propel w/c to and from therapy treatment areas.  She remained in the w/c at close of session with call bell and personal items in reach.    Pm session:  She is seated in w/c and ready for afternoon therapy session - IV Picc running during session.  SPT to/from w/c and mat table with CGA.  Completed unsupported sitting core/trunk and pelvis mobility and strengthening activities, standing with RW CGA.  She demonstrates good tolerance for conditioning activities in unsupported position.  Returned to room, seated in w/c with call bell and tray table in reach.    Therapy Documentation Precautions:  Precautions Precautions: Fall Precaution Comments: wound VAC Required Braces or Orthoses: Other Brace Other Brace: R residual limb protector- does not fit Restrictions Weight Bearing Restrictions: Yes RLE Weight Bearing: Non weight bearing General:   Vital Signs: Therapy Vitals Temp: 98.3 F (36.8 C) Temp Source: Oral Pulse Rate: 94 BP: 123/78 Patient Position (if  appropriate): Sitting Oxygen Therapy SpO2: 95 % O2 Device: Room Air Pain: Pain Assessment Pain Scale: 0-10 Pain Score: 0-No pain Pain Type: Surgical pain Pain Location: Leg Pain Orientation: Right Pain Descriptors / Indicators: Discomfort Pain Frequency: Intermittent Pain Onset: On-going Patients Stated Pain Goal: 0 Pain Intervention(s): Medication (See eMAR)   Other Treatments:     Therapy/Group: Individual Therapy  Carlos Levering 02/13/2019, 4:00 PM

## 2019-02-13 NOTE — Progress Notes (Signed)
Physical Therapy Session Note  Patient Details  Name: Sheila Austin MRN: [id number] Date of Birth: 09-03-1985  Today's Date: 02/13/2019  AM Session  PT Individual Time: 4098-1191 PT Individual Time Calculation (min): 55 min   PM Session  Time: 4782-9562 Time Calculation: 30 min  Short Term Goals: Week 1:  PT Short Term Goal 1 (Week 1): = LTGs  Skilled Therapeutic Interventions/Progress Updates:  AM Session:  Pt received in bed upon arrival for therapy. Pt agreeable to treatment even though she stated feeling tired and fatigued today. Pt reports getting her PICC line placed late last night and not sleeping through the whole night. Pt performed stand pivot transfer with RW and CGA from bed>wheelchair. Pt self propelled wheelchair from bedside>bathroom. Pt transferred via stand pivot transfer from wheelchair to commode over toilet with UE support via grab bars and CGA. + void Pt able to complete her own pericare. Pt transferred via stand pivot transfer from commode over toilet>wheelchair. Pt self propelled wheelchair about 125 feet with supervision from room>day room. Pt transferred from wheelchair>mat table via slide board transfer and CGA. Pt reports stand pivot transfer seems less difficult for her at this time. Pt completed 4x10 of the following mat exercises: in prone, hip EXT, in L sidelying, R hip ABD, in supine, SAQ and isometric hip EXT/bridging, and in sitting, LAQ. Pt required verbal and tactile cueing for proper form and technique of mat level exercises. Then, pt completed sit>stand with RW and CGA. Pt sustained standing for 5 minutes while playing game of Jenga on table with SPT. Pt reported some fatigue in L standing leg after activity. Pt performed stand pivot transfer with RW and CGA back to wheelchair. Pt self propelled wheelchair about 75 feet back towards room. Pt transported the remaining distance back to her room and left sitting upright in wheelchair with call bell within  reach. NT present in room making bed. All needs met at this time.    PM Session:  Pt received upright in wheelchair agreeable to treatment. Pt self propelled wheelchair about 75 feet from room>therapy gym. Pt performed sit>stand and gait in the parallel bars, 5 steps forward, 5 steps reverse x3 with CGA. Pt required verbal cueing for use of the UEs to advance forward and reverse in the parallel bars. Pt returned to wheelchair. Pt completed wheelchair pushups, 2x10. Pt requiring verbal cueing for proper technique of task. Then, pt self propelled her wheelchair while navigating cones forwardsx2 and backwardsx2. Pt self propelled wheelchair about 75 feet back to her room. Pt left sitting upright in wheelchair with call bell within reach. All needs met at this time.     Therapy Documentation Precautions:  Precautions Precautions: Fall Precaution Comments: wound VAC Required Braces or Orthoses: Other Brace Other Brace: R residual limb protector- does not fit Restrictions Weight Bearing Restrictions: Yes RLE Weight Bearing: Non weight bearing  Pain:denies pain during session   Therapy/Group: Individual Therapy  Olena Leatherwood, SPT  02/13/2019, 10:41 AM

## 2019-02-14 ENCOUNTER — Inpatient Hospital Stay (HOSPITAL_COMMUNITY): Payer: 59 | Admitting: Physical Therapy

## 2019-02-14 ENCOUNTER — Inpatient Hospital Stay (HOSPITAL_COMMUNITY): Payer: 59

## 2019-02-14 LAB — GLUCOSE, CAPILLARY
Glucose-Capillary: 113 mg/dL — ABNORMAL HIGH (ref 70–99)
Glucose-Capillary: 142 mg/dL — ABNORMAL HIGH (ref 70–99)
Glucose-Capillary: 98 mg/dL (ref 70–99)
Glucose-Capillary: 130 mg/dL — ABNORMAL HIGH (ref 70–99)

## 2019-02-14 MED ORDER — SODIUM CHLORIDE 0.9 % IV SOLN
INTRAVENOUS | Status: DC | PRN
  Administered 2019-02-14 – 2019-02-15 (×2): 250 mL via INTRAVENOUS

## 2019-02-14 NOTE — Progress Notes (Signed)
Occupational Therapy Session Note  Patient Details  Name: Sheila Austin MRN: [id number] Date of Birth: 1985/08/31  Today's Date: 02/14/2019 OT Individual Time: [id number] OT Individual Time Calculation (min): 57 min    Short Term Goals: Week 1:  OT Short Term Goal 1 (Week 1): STGs=LTGs secondary to short estimated LOS  Skilled Therapeutic Interventions/Progress Updates:    1;1. Pt received EOB with RN delivering pain medication and pt does not rate pain. Pt agreeable to OT tx. Pt completes stand pivot transfer with RW and MIN A from elevated bed. Pt grooms at sink and washes hair with hair washing tray with MIN A for rinsing. Pt combs and dries hair with hair dryer for BUE strengthening. Pt completes UB therex with 5# dowel rod for BUE strengthening requried for ADLs and transfers as follows with demo cueing: shouder flex/ext, ab/adduct, horizontal ab/adduct, chest press, overhead press and elbow flex/ext, and w/c push ups 2x10-15 reps. Exited session with pt seated in w/c, call light in reach and all need smet  Therapy Documentation Precautions:  Precautions Precautions: Fall Precaution Comments: wound VAC Required Braces or Orthoses: Other Brace Other Brace: R residual limb protector- does not fit Restrictions Weight Bearing Restrictions: Yes RLE Weight Bearing: Non weight bearing General:   Vital Signs: Therapy Vitals Temp: 97.9 F (36.6 C) Temp Source: Oral Pulse Rate: 96 Resp: 17 BP: (!) 150/90 Patient Position (if appropriate): Lying Oxygen Therapy SpO2: 96 % O2 Device: Room Air Pain:   ADL:   Vision   Perception    Praxis   Exercises:   Other Treatments:     Therapy/Group: Individual Therapy  Tonny Branch 02/14/2019, 9:58 AM

## 2019-02-14 NOTE — Progress Notes (Signed)
Physical Therapy Session Note  Patient Details  Name: Sheila Austin MRN: [id number] Date of Birth: 1985-08-08  Session 1  Today's Date: 02/14/2019 PT Individual Time: 1300-1400  PT Individual Time Calculation (min): 60 min   Session 2  Time: 0721-8288 Time Calculation: 60 min   Short Term Goals: Week 1:  PT Short Term Goal 1 (Week 1): = LTGs  Skilled Therapeutic Interventions/Progress Updates:  Session 1  Pt upright in wheelchair upon arrival for therapy with fiance in the room. Pt agreeable to treatment. Pt self propelled her wheelchair about 75 feet from room>therapy gym. Pt completed 5xSTS in 1 minute, 31 seconds and then 3 sit>stands with RW and CGA. Pt required verbal cueing for appropriate technique of task. Pt became tearful when she had an unsuccessful sit>stand d/t frustration with herself. Pt offered encouragement and reminder that therapies will be difficult at times. Pt completed remaining 2 sit>stands with RW and CGA. Then, pt performed sit>stand and gait in the parallel bars, 5-6 steps forward, 5-6 steps reverse x4 with minA. Pt required verbal cueing for use of the UEs to advance forward and reverse. Pt took a seated rest break afterwards d/t fatigue. Then, pt stepped up forward and down backwardsx4 onto 2 inch box with use of bilat UEs in the parallel bars for support. Pt required moderate verbal cueing for sequencing of techique of task. Pt returned to wheelchair. Pt independent with wheelchair parts and self propelled wheelchair 75 feet from therapy gym>room. Pt left sitting upright in chair with call bell within reach. Fiance present in room. All needs met at this time.   Session 2  Pt upright in wheelchair upon arrival for therapy. Pt agreeable to treatment. Pt self propelled wheelchair over 500 feet outside of the hospital. Pt completed multiple turns and navigated slight ramps/hills which required verbal cueing for appropriate technique and safety. Pt took multiple rest  breaks d/t fatigue prn. Pt returned to inside therapy gym. Pt transferred via slide board from wheelchair>mat table requiring CGA for safety. Pt completed sit>stand with CGA and stood for 7 minutes, 10 seconds with supervision while playing stackable UNO with SPT. Pt was able to laterally sidebend to the R and L and forwards in order to look for appropriate game piece. Pt sat down on mat table and transferred via slide board from mat>wheelchair with supervision for safety.  Pt provided with wheelchair gloves. Pt independent with wheelchair parts and self propelled wheelchair 75 feet from therapy gym>room. Pt left sitting upright in chair with call bell within reach. All needs met at this time.   Therapy Documentation Precautions:  Precautions Precautions: Fall Precaution Comments: wound VAC Required Braces or Orthoses: Other Brace Other Brace: R residual limb protector- does not fit Restrictions Weight Bearing Restrictions: Yes RLE Weight Bearing: Non weight bearing  Pain: Denies pain   Therapy/Group: Individual Therapy  Olena Leatherwood, SPT  02/14/2019, 5:18 PM

## 2019-02-14 NOTE — Progress Notes (Signed)
[place] PHYSICAL MEDICINE & REHABILITATION PROGRESS NOTE   Subjective/Complaints:  No issues overnite  ROS- no bowel problems, no CP or SOB  Objective:   No results found. Recent Labs    02/13/19 0550  WBC 10.3  HGB 8.2*  HCT 27.4*  PLT 664*   Recent Labs    02/13/19 0550  NA 134*  K 4.9  CL 98  CO2 25  GLUCOSE 151*  BUN 17  CREATININE 0.92  CALCIUM 9.0    Intake/Output Summary (Last 24 hours) at 02/14/2019 0654 Last data filed at 02/13/2019 1810 Gross per 24 hour  Intake 820 ml  Output 1 ml  Net 819 ml     Physical Exam: Vital Signs Blood pressure (!) 150/90, pulse 96, temperature 97.9 F (36.6 C), temperature source Oral, resp. rate 17, height 5\' 2"  (1.575 m), weight 123.7 kg, SpO2 96 %.   Nursing note and vitals reviewed. Constitutional: awake, alert, appropriate, sitting up in manual w/c in room; conversant (works at [employer]), NAD Morbidly obese  HENT: very bad dentition/broken front teeth Head: Normocephalic and atraumatic.  Eyes: conjugate gaze Neck: No tracheal deviation present. No thyromegaly present.  Card- RRR Respiratory: Effort normal. No respiratory distress. CTA B/L  GI: soft, NT, ND, (+)BS  Musculoskeletal:     Comments: R-BKA edematous with wound VAC  LLE edema  Neurological: She is alert and oriented to person, place, and time.  Motor: B/l UE, LLE, Right hip 5/5 proximal to distal Sensation intact to light touch  Skin: Skin is warm and dry.  +VAC  Psychiatric: She has a normal mood and affect. Her behavior is normal.    Assessment/Plan: 1. 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  Physiatrist is providing close team supervision and 24 hour management of active medical problems listed below.  Physiatrist and rehab team continue to assess barriers to discharge/monitor patient progress toward functional and medical  goals  Care Tool:  Bathing    Body parts bathed by patient: Right arm, Left arm, Chest, Abdomen, Front perineal area, Buttocks, Right upper leg, Left upper leg, Left lower leg, Face     Body parts n/a: Right lower leg   Bathing assist Assist Level: Contact Guard/Touching assist     Upper Body Dressing/Undressing Upper body dressing   What is the patient wearing?: Pull over shirt    Upper body assist Assist Level: Supervision/Verbal cueing    Lower Body Dressing/Undressing Lower body dressing      What is the patient wearing?: Underwear/pull up, Pants     Lower body assist Assist for lower body dressing: Minimal Assistance - Patient > 75%     Toileting Toileting    Toileting assist Assist for toileting: Moderate Assistance - Patient 50 - 74%     Transfers Chair/bed transfer  Transfers assist     Chair/bed transfer assist level: Contact Guard/Touching assist     Locomotion Ambulation   Ambulation assist      Assist level: Contact Guard/Touching assist Assistive device: Parallel bars Max distance: 6   Walk 10 feet activity   Assist  Walk 10 feet activity did not occur: Safety/medical concerns  Assist level: Minimal Assistance - Patient > 75% Assistive device: Walker-rolling   Walk 50 feet activity   Assist Walk 50 feet with 2 turns activity did not occur: Safety/medical concerns         Walk 150 feet activity  Assist Walk 150 feet activity did not occur: Safety/medical concerns         Walk 10 feet on uneven surface  activity   Assist Walk 10 feet on uneven surfaces activity did not occur: Safety/medical concerns         Wheelchair     Assist Will patient use wheelchair at discharge?: Yes Type of Wheelchair: Manual    Wheelchair assist level: Supervision/Verbal cueing Max wheelchair distance: 25    Wheelchair 50 feet with 2 turns activity    Assist        Assist Level: Supervision/Verbal cueing    Wheelchair 150 feet activity     Assist      Assist Level: Supervision/Verbal cueing   Blood pressure (!) 150/90, pulse 96, temperature 97.9 F (36.6 C), temperature source Oral, resp. rate 17, height 5\' 2"  (1.575 m), weight 123.7 kg, SpO2 96 %.    Medical Problem List and Plan: 1.  Deficits with mobility and self-care secondary to right BKA. CIR PT, OT 2.  Antithrombotics: -DVT/anticoagulation:  Pharmaceutical: Lovenox             -antiplatelet therapy: N/A 3. Pain Management: Oxycodone as needed.  We will add low-dose gabapentin to augment pain management/phantom symptoms. D/c dilaudid.   - pt reports pain is controlled currently. Took only 2 oxy IR yesterday              Monitor pain with increased activity. 4. Mood: LCSW to follow for evaluation and support             -antipsychotic agents: N/A 5. Neuropsych: This patient is capable of making decisions on on her own behalf. 6. Skin/Wound Care: Wound VAC d/ced today wound looks great , needs edema management too large for shrinker , will ACE wrap    7. Fluids/Electrolytes/Nutrition: Monitor I/O.  CMP ordered for tomorrow.  8.  Sepsis with bacteremia: Has been afebrile.  Continue ceftriaxone 2 g every 24 for 4 total weeks end date 03/04/19 9. T2DM: Hgb A1C- 10.8.  Has not been on medications for years and now aware of importance of compliance.  Will monitor blood sugars AC/at bedtime.  Continue Lantus insulin and titrate upwards as indicated.   -pt reports A1c was 14- brought down to 10.8 with diet changes CBG (last 3)  Recent Labs    02/13/19 1702 02/13/19 2126 02/14/19 0621  GLUCAP 124* 141* 113*  good control 10/10 10. Nausea/vomiting: Has resolved with treatment of sepsis.  Continue PPI.  Denies issues with gastroparesis.  Will change Reglan to p.o. route and wean off as able. 11.  Mild tachycardia: We will monitor for now.  Blood pressures reasonable.             Monitor HR with increased exertion.  12.   Leukocytosis: resolved afeb 13.  Anemia: Iron deficiency with low folate levels and elevated ferritin.  Add folic acid.  Likely needs IV iron for supplementation         Hgb improved to 8.2 14.  Hyponatremia: We will continue to monitor--should improve as N/V resolved and intake improving.   Na improved to 133 15.  Morbid obesity with malnutrition:  Protein supplements to promote wound healing.  Educated patient on importance of appropriate diet and weight loss to help promote health and activity.  Will consult dietitian for diet education. 16. Hypocalcemia  Ca 7.7- will start on repletion. 17.  Urinary retention improved after urecholine , was emptying prior to flomax dc  flomax, will try d/c urecholine in 1-2 d on low dose  LOS: 5 days A FACE TO FACE EVALUATION WAS PERFORMED  Charlett Blake 02/14/2019, 6:54 AM

## 2019-02-15 LAB — GLUCOSE, CAPILLARY
Glucose-Capillary: 121 mg/dL — ABNORMAL HIGH (ref 70–99)
Glucose-Capillary: 117 mg/dL — ABNORMAL HIGH (ref 70–99)
Glucose-Capillary: 78 mg/dL (ref 70–99)
Glucose-Capillary: 96 mg/dL (ref 70–99)

## 2019-02-15 MED ORDER — ENOXAPARIN SODIUM 60 MG/0.6ML ~~LOC~~ SOLN
60.0000 mg | SUBCUTANEOUS | Status: DC
  Administered 2019-02-15: 60 mg via SUBCUTANEOUS
  Filled 2019-02-15: qty 0.6

## 2019-02-15 NOTE — Progress Notes (Signed)
[place] PHYSICAL MEDICINE & REHABILITATION PROGRESS NOTE   Subjective/Complaints: BPs have been elevated, did not take BP meds at home Per pt , nsg is using regular cuff not large despite , morbid obesity   ROS- no bowel problems, no CP or SOB  Objective:   No results found. Recent Labs    02/13/19 0550  WBC 10.3  HGB 8.2*  HCT 27.4*  PLT 664*   Recent Labs    02/13/19 0550  NA 134*  K 4.9  CL 98  CO2 25  GLUCOSE 151*  BUN 17  CREATININE 0.92  CALCIUM 9.0    Intake/Output Summary (Last 24 hours) at 02/15/2019 0730 Last data filed at 02/14/2019 1844 Gross per 24 hour  Intake 380.28 ml  Output -  Net 380.28 ml     Physical Exam: Vital Signs Blood pressure (!) 155/99, pulse 92, temperature 97.9 F (36.6 C), resp. rate 20, height 5\' 2"  (1.575 m), weight 123.7 kg, SpO2 99 %.   Nursing note and vitals reviewed. Constitutional: awake, alert, appropriate, sitting up in manual w/c in room; conversant (works at [employer]), NAD Morbidly obese  HENT: very bad dentition/broken front teeth Head: Normocephalic and atraumatic.  Eyes: conjugate gaze Neck: No tracheal deviation present. No thyromegaly present.  Card- RRR Respiratory: Effort normal. No respiratory distress. CTA B/L  GI: soft, NT, ND, (+)BS  Musculoskeletal:     Comments: R-BKA edematous with wound VAC  LLE edema  Neurological: She is alert and oriented to person, place, and time.  Motor: B/l UE, LLE, Right hip 5/5 proximal to distal Sensation intact to light touch  Skin: Skin is warm and dry.     Psychiatric: She has a normal mood and affect. Her behavior is normal.    Assessment/Plan: 1. 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  Physiatrist is providing close team supervision and 24 hour management of active medical problems listed below.  Physiatrist and rehab team continue to assess  barriers to discharge/monitor patient progress toward functional and medical goals  Care Tool:  Bathing    Body parts bathed by patient: Right arm, Left arm, Chest, Abdomen, Front perineal area, Buttocks, Right upper leg, Left upper leg, Left lower leg, Face     Body parts n/a: Right lower leg   Bathing assist Assist Level: Contact Guard/Touching assist     Upper Body Dressing/Undressing Upper body dressing   What is the patient wearing?: Pull over shirt    Upper body assist Assist Level: Supervision/Verbal cueing    Lower Body Dressing/Undressing Lower body dressing      What is the patient wearing?: Underwear/pull up, Pants     Lower body assist Assist for lower body dressing: Minimal Assistance - Patient > 75%     Toileting Toileting    Toileting assist Assist for toileting: Moderate Assistance - Patient 50 - 74%     Transfers Chair/bed transfer  Transfers assist     Chair/bed transfer assist level: Contact Guard/Touching assist     Locomotion Ambulation   Ambulation assist      Assist level: Contact Guard/Touching assist Assistive device: Parallel bars Max distance: 6   Walk 10 feet activity   Assist  Walk 10 feet activity did not occur: Safety/medical concerns  Assist level: Minimal Assistance - Patient > 75% Assistive device: Walker-rolling   Walk 50 feet activity   Assist Walk 50 feet with 2  turns activity did not occur: Safety/medical concerns         Walk 150 feet activity   Assist Walk 150 feet activity did not occur: Safety/medical concerns         Walk 10 feet on uneven surface  activity   Assist Walk 10 feet on uneven surfaces activity did not occur: Safety/medical concerns         Wheelchair     Assist Will patient use wheelchair at discharge?: Yes Type of Wheelchair: Manual    Wheelchair assist level: Set up assist Max wheelchair distance: 75    Wheelchair 50 feet with 2 turns activity     Assist        Assist Level: Set up assist   Wheelchair 150 feet activity     Assist      Assist Level: Supervision/Verbal cueing   Blood pressure (!) 155/99, pulse 92, temperature 97.9 F (36.6 C), resp. rate 20, height 5\' 2"  (1.575 m), weight 123.7 kg, SpO2 99 %.    Medical Problem List and Plan: 1.  Deficits with mobility and self-care secondary to right BKA. CIR PT, OT 2.  Antithrombotics: -DVT/anticoagulation:  Pharmaceutical: Lovenox             -antiplatelet therapy: N/A 3. Pain Management: Oxycodone as needed.  We will add low-dose gabapentin to augment pain management/phantom symptoms. D/c dilaudid.   - pt reports pain is controlled currently. Took only 2 oxy IR yesterday              Monitor pain with increased activity. 4. Mood: LCSW to follow for evaluation and support             -antipsychotic agents: N/A 5. Neuropsych: This patient is capable of making decisions on on her own behalf. 6. Skin/Wound Care: Wound VAC d/ced 10/10 wound looks great , needs edema management too large for shrinker , will ACE wrap    7. Fluids/Electrolytes/Nutrition: Monitor I/O.  CMP ordered for tomorrow.  8.  Sepsis with bacteremia: Has been afebrile.  Continue ceftriaxone 2 g every 24 for 4 total weeks end date 03/04/19 9. T2DM: Hgb A1C- 10.8.  Has not been on medications for years and now aware of importance of compliance.  Will monitor blood sugars AC/at bedtime.  Continue Lantus insulin and titrate upwards as indicated.   -pt reports A1c was 14- brought down to 10.8 with diet changes CBG (last 3)  Recent Labs    02/14/19 1634 02/14/19 2126 02/15/19 0621  GLUCAP 98 130* 121*  good control 10/11 10. Nausea/vomiting: Has resolved with treatment of sepsis.  Continue PPI.  Denies issues with gastroparesis.  Will change Reglan to p.o. route and wean off as able. 11.  Mild tachycardia: We will monitor for now.  Blood pressures reasonable.             Monitor HR with increased  exertion.  12.  Leukocytosis: resolved afeb 13.  Anemia: Iron deficiency with low folate levels and elevated ferritin.  Add folic acid.  Likely needs IV iron for supplementation         Hgb improved to 8.2 14.  Hyponatremia: We will continue to monitor--should improve as N/V resolved and intake improving.   Na improved to 133 15.  Morbid obesity with malnutrition:  Protein supplements to promote wound healing.  Educated patient on importance of appropriate diet and weight loss to help promote health and activity.  Will consult dietitian for diet education. 16. Hypocalcemia  Ca 7.7- will start on repletion. 17.  Urinary retention improved after urecholine ,will try d/c urecholine  LOS: 6 days A FACE TO [place] E Kirsteins 02/15/2019, 7:30 AM

## 2019-02-16 ENCOUNTER — Inpatient Hospital Stay (HOSPITAL_COMMUNITY): Payer: 59 | Admitting: Occupational Therapy

## 2019-02-16 ENCOUNTER — Inpatient Hospital Stay (HOSPITAL_COMMUNITY): Payer: 59 | Admitting: Physical Therapy

## 2019-02-16 DIAGNOSIS — G8918 Other acute postprocedural pain: Secondary | ICD-10-CM

## 2019-02-16 DIAGNOSIS — D62 Acute posthemorrhagic anemia: Secondary | ICD-10-CM

## 2019-02-16 DIAGNOSIS — Z6841 Body Mass Index (BMI) 40.0 and over, adult: Secondary | ICD-10-CM

## 2019-02-16 DIAGNOSIS — E1165 Type 2 diabetes mellitus with hyperglycemia: Secondary | ICD-10-CM

## 2019-02-16 DIAGNOSIS — A419 Sepsis, unspecified organism: Secondary | ICD-10-CM

## 2019-02-16 DIAGNOSIS — L0390 Cellulitis, unspecified: Secondary | ICD-10-CM

## 2019-02-16 DIAGNOSIS — S88111S Complete traumatic amputation at level between knee and ankle, right lower leg, sequela: Secondary | ICD-10-CM

## 2019-02-16 DIAGNOSIS — E875 Hyperkalemia: Secondary | ICD-10-CM

## 2019-02-16 LAB — BASIC METABOLIC PANEL
Anion gap: 7 (ref 5–15)
BUN: 19 mg/dL (ref 6–20)
CO2: 29 mmol/L (ref 22–32)
Calcium: 8.7 mg/dL — ABNORMAL LOW (ref 8.9–10.3)
Chloride: 101 mmol/L (ref 98–111)
Creatinine, Ser: 1.06 mg/dL — ABNORMAL HIGH (ref 0.44–1.00)
GFR calc Af Amer: >60 mL/min (ref >60)
GFR calc non Af Amer: >60 mL/min (ref >60)
Glucose, Bld: 113 mg/dL — ABNORMAL HIGH (ref 70–99)
Potassium: 5.4 mmol/L — ABNORMAL HIGH (ref 3.5–5.1)
Sodium: 137 mmol/L (ref 135–145)

## 2019-02-16 LAB — GLUCOSE, CAPILLARY
Glucose-Capillary: 98 mg/dL (ref 70–99)
Glucose-Capillary: 137 mg/dL — ABNORMAL HIGH (ref 70–99)
Glucose-Capillary: 119 mg/dL — ABNORMAL HIGH (ref 70–99)
Glucose-Capillary: 120 mg/dL — ABNORMAL HIGH (ref 70–99)

## 2019-02-16 MED ORDER — METOCLOPRAMIDE HCL 5 MG PO TABS
5.0000 mg | ORAL_TABLET | Freq: Two times a day (BID) | ORAL | Status: AC
  Administered 2019-02-16 – 2019-02-18 (×4): 5 mg via ORAL
  Filled 2019-02-16 (×4): qty 1

## 2019-02-16 MED ORDER — OXYCODONE HCL 5 MG PO TABS
5.0000 mg | ORAL_TABLET | ORAL | Status: DC | PRN
  Administered 2019-02-16 – 2019-02-17 (×2): 10 mg via ORAL
  Administered 2019-02-18: 5 mg via ORAL
  Filled 2019-02-16 (×2): qty 2
  Filled 2019-02-16: qty 1

## 2019-02-16 MED ORDER — SODIUM POLYSTYRENE SULFONATE 15 GM/60ML PO SUSP
30.0000 g | Freq: Once | ORAL | Status: AC
  Administered 2019-02-16: 12:00:00 30 g via ORAL
  Filled 2019-02-16: qty 120

## 2019-02-16 MED ORDER — HEPARIN SODIUM (PORCINE) 5000 UNIT/ML IJ SOLN
5000.0000 [IU] | Freq: Three times a day (TID) | INTRAMUSCULAR | Status: DC
  Administered 2019-02-16 – 2019-02-18 (×7): 5000 [IU] via SUBCUTANEOUS
  Filled 2019-02-16 (×8): qty 1

## 2019-02-16 NOTE — Progress Notes (Signed)
Occupational Therapy Session Note  Patient Details  Name: Sheila Austin MRN: [id number] Date of Birth: Oct 16, 1985  Today's Date: 02/16/2019 OT Individual Time: 0800-0900 OT Individual Time Calculation (min): 60 min    Short Term Goals: Week 1:  OT Short Term Goal 1 (Week 1): STGs=LTGs secondary to short estimated LOS  Skilled Therapeutic Interventions/Progress Updates:    Upon entering the room, pt supine in bed with no c/o pain and agreeable to OT intervention. Pt performed stand pivot transfer onto drop arm commode chair for toileting with min guard for balance. Pt able to perform hygiene herself and removed clothing before returning to sit on EOB for bathing and dressing tasks. Set up A for UB self care and pt utilized reacher to thread clothing onto L foot and lateral leans to pull over buttocks. Physician arrived to room and removed dressing. OT provided pt with inspection mirror to look at residual limb herself and education provided with regards to importance of inspecting skin for changes. OT dressing residual limb and wrapped. Pt transferred into wheelchair with supervision lateral scoot into chair and propelled self to sink for grooming tasks. Pt left at sink with call bell and all needed items within reach upon exiting the room.   Therapy Documentation Precautions:  Precautions Precautions: Fall Precaution Comments: wound VAC Required Braces or Orthoses: Other Brace Other Brace: R residual limb protector- does not fit Restrictions Weight Bearing Restrictions: Yes RLE Weight Bearing: Non weight bearing Pain: Pain Assessment Pain Scale: 0-10 Pain Score: 2  Pain Type: Surgical pain Pain Location: Leg Pain Orientation: Right Pain Descriptors / Indicators: Aching;Discomfort Pain Frequency: Constant Pain Onset: On-going Patients Stated Pain Goal: 0 Pain Intervention(s): Medication (See eMAR)   Therapy/Group: Individual Therapy  [name] 02/16/2019, 12:52  PM

## 2019-02-16 NOTE — Progress Notes (Signed)
Physical Therapy Session Note  Patient Details  Name: Sheila Austin MRN: [id number] Date of Birth: 11-Feb-1986  Today's Date: 02/16/2019 PT Individual Time: 1400-1500 PT Individual Time Calculation (min): 60 min   Short Term Goals: Week 1:  PT Short Term Goal 1 (Week 1): = LTGs  Skilled Therapeutic Interventions/Progress Updates: Pt presented in w/c with finacee present agreeable to therapy. Pt denies pain during session. Pt propelled to rehab gym with supervision,performed stand pivot transfer to mat with RW and CGA to close S. Transferred to supine mod I and performed supine SLR 2 x 10, sidelying abd 2 x 10, prone push ups, and supermans x 5 with 10 second hold. Pt returned to supine and performed STS x 5 with RW and supervision. PTA changed RW to a lighter version and pt was able to ambulate approx 75f including 1 right turn. PTA discussed with pt home set up and possibility that may need to perform side stepping to get in bathroom. Will initiate next session. Pt returned to room and remained in w/c. PTA obtained ELR due to increased swelling in LLE. Pt left with call bell within reach and needs met.      Therapy Documentation Precautions:  Precautions Precautions: Fall Precaution Comments: wound VAC Required Braces or Orthoses: Other Brace Other Brace: R residual limb protector- does not fit Restrictions Weight Bearing Restrictions: Yes RLE Weight Bearing: Non weight bearing General:   Vital Signs: Therapy Vitals Temp: 98.1 F (36.7 C) Temp Source: Oral Pulse Rate: 94 BP: (!) 145/103 Patient Position (if appropriate): Standing Oxygen Therapy SpO2: 100 % O2 Device: Room Air Pain: Pain Assessment Pain Scale: 0-10 Pain Score: 2    Therapy/Group: Individual Therapy  Iaan Oregel  Damien Cisar, PTA  02/16/2019, 3:50 PM

## 2019-02-16 NOTE — Progress Notes (Signed)
Social Work Patient ID: Sheila Austin, female   DOB: 10/05/1985, 33 y.o.   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  A rolling walker  will not resolve issue with performing activities of daily living.  A wheelchair will allow patient to safely perform daily activities.  Patient is not able to propel themselves in the home using a standard weight wheelchair due to endurance and fatigue .  Patient can self propel in the lightweight wheelchair.

## 2019-02-16 NOTE — Progress Notes (Signed)
Orthopedic Tech Progress Note Patient Details:  Sheila Austin 1985-07-13 [id number]  Patient ID: [name], female   DOB: Aug 09, 1985, 33 y.o.   MRN: [id number]   Sheila Austin 02/16/2019, 11:36 AMCalled [employer] for right stump shrinkers x2

## 2019-02-16 NOTE — Progress Notes (Signed)
Occupational Therapy Session Note  Patient Details  Name: Sheila Austin MRN: [id number] Date of Birth: 12/08/1985  Today's Date: 02/16/2019 OT Individual Time: 1000-1100 OT Individual Time Calculation (min): 60 min    Short Term Goals: Week 1:  OT Short Term Goal 1 (Week 1): STGs=LTGs secondary to short estimated LOS  Skilled Therapeutic Interventions/Progress Updates:    Patient seated in w/c and ready for therapy.  She is able to propel w/c to and from room and treatment areas.  Completed UB ergometer 2 x 5 minutes at level 6.  SB transfer CS w/c to mat (she is able to set up chair and place board independently)  Unsupported sitting core/trunk and UB conditioning activities with good tolerance and improving strength.  Sit to stand with RW CS - tolerates standing 5-7 minutes x 2 with right LE stretching and trunk ROM.  She remained seated in w/c at close of session with needs met, call bell in reach.    Therapy Documentation Precautions:  Precautions Precautions: Fall Precaution Comments: wound VAC Required Braces or Orthoses: Other Brace Other Brace: R residual limb protector- does not fit Restrictions Weight Bearing Restrictions: Yes RLE Weight Bearing: Non weight bearing General:   Vital Signs:   Pain: Pain Assessment Pain Scale: 0-10 Pain Score: 2  Pain Type: Surgical pain Pain Location: Leg Pain Orientation: Right Pain Descriptors / Indicators: Aching;Discomfort Pain Frequency: Constant Pain Onset: On-going Patients Stated Pain Goal: 0 Pain Intervention(s): Medication (See eMAR) Other Treatments:     Therapy/Group: Individual Therapy  Carlos Levering 02/16/2019, 12:37 PM

## 2019-02-16 NOTE — Progress Notes (Addendum)
[place] PHYSICAL MEDICINE & REHABILITATION PROGRESS NOTE   Subjective/Complaints: Patient seen sitting up in bed working with therapy this morning.  She states she slept well overnight.  Discussed dressing changes with patient in therapies.  ROS- Denies CP, SOB, N/V/D  Objective:   No results found. No results for input(s): WBC, HGB, HCT, PLT in the last 72 hours. Recent Labs    02/16/19 0350  NA 137  K 5.4*  CL 101  CO2 29  GLUCOSE 113*  BUN 19  CREATININE 1.06*  CALCIUM 8.7*    Intake/Output Summary (Last 24 hours) at 02/16/2019 1114 Last data filed at 02/16/2019 0749 Gross per 24 hour  Intake 730 ml  Output -  Net 730 ml     Physical Exam: Vital Signs Blood pressure 130/83, pulse 92, temperature 98.9 F (37.2 C), temperature source Oral, resp. rate 16, height 5\' 2"  (1.575 m), weight 123.7 kg, SpO2 96 %. Constitutional: No distress . Vital signs reviewed.  Morbidly obese. HENT: Normocephalic.  Atraumatic. Eyes: EOMI. No discharge. Cardiovascular: No JVD. Respiratory: Normal effort.  No stridor. GI: Non-distended. Skin: Right stump with serosanguineous drainage along the medial and lateral incision Psych: Normal mood.  Normal behavior. 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  Physiatrist is providing close team supervision and 24 hour management of active medical problems listed below.  Physiatrist and rehab team continue to assess barriers to discharge/monitor patient progress toward functional and medical goals  Care Tool:  Bathing    Body parts bathed by patient: Right arm, Left arm, Chest, Abdomen, Front perineal area, Buttocks, Right  upper leg, Left upper leg, Left lower leg, Face     Body parts n/a: Right lower leg   Bathing assist Assist Level: Contact Guard/Touching assist     Upper Body Dressing/Undressing Upper body dressing   What is the patient wearing?: Pull over shirt    Upper body assist Assist Level: Supervision/Verbal cueing    Lower Body Dressing/Undressing Lower body dressing      What is the patient wearing?: Underwear/pull up, Pants     Lower body assist Assist for lower body dressing: Minimal Assistance - Patient > 75%     Toileting Toileting    Toileting assist Assist for toileting: Moderate Assistance - Patient 50 - 74%     Transfers Chair/bed transfer  Transfers assist     Chair/bed transfer assist level: Contact Guard/Touching assist     Locomotion Ambulation   Ambulation assist      Assist level: Contact Guard/Touching assist Assistive device: Parallel bars Max distance: 6   Walk 10 feet activity   Assist  Walk 10 feet activity did not occur: Safety/medical concerns  Assist level: Minimal Assistance - Patient > 75% Assistive device: Walker-rolling   Walk 50 feet activity   Assist Walk 50 feet with 2 turns activity did not occur: Safety/medical concerns         Walk 150 feet activity   Assist Walk 150 feet activity did not occur: Safety/medical concerns         Walk 10 feet on uneven surface  activity   Assist Walk 10 feet on uneven surfaces activity did not occur: Safety/medical concerns  Wheelchair     Assist Will patient use wheelchair at discharge?: Yes Type of Wheelchair: Manual    Wheelchair assist level: Set up assist Max wheelchair distance: 75    Wheelchair 50 feet with 2 turns activity    Assist        Assist Level: Set up assist   Wheelchair 150 feet activity     Assist      Assist Level: Supervision/Verbal cueing   Blood pressure 130/83, pulse 92, temperature 98.9 F (37.2 C),  temperature source Oral, resp. rate 16, height 5\' 2"  (1.575 m), weight 123.7 kg, SpO2 96 %.    Medical Problem List and Plan: 1.  Deficits with mobility and self-care secondary to short right BKA.  Continue CR 2.  Antithrombotics: -DVT/anticoagulation:  Pharmaceutical: Lovenox changed to heparin due to?  Side effect of hyperkalemia             -antiplatelet therapy: N/A 3. Pain Management: Oxycodone as needed.    Added low-dose gabapentin to augment pain management/phantom symptoms.               Monitor pain with increased activity.  Relatively controlled on 10/12 with medications 4. Mood: LCSW to follow for evaluation and support             -antipsychotic agents: N/A 5. Neuropsych: This patient is capable of making decisions on on her own behalf. 6. Skin/Wound Care: Wound VAC d/ced 10/10  Stump shrinker ordered 7. Fluids/Electrolytes/Nutrition: Monitor I/O.   8.  Sepsis with bacteremia: Has been afebrile.  Continue ceftriaxone 2 g every 24 for 4 total weeks end date 03/04/2019 9. T2DM: Hgb A1C- 10.8.  Has not been on medications for years and now aware of importance of compliance.  Will monitor blood sugars AC/at bedtime.  Continue Lantus insulin and titrate upwards as indicated.  CBG (last 3)  Recent Labs    02/15/19 1703 02/15/19 2112 02/16/19 0612  GLUCAP 78 96 98   Relatively controlled on 02/16/2019 10. Nausea/vomiting: Has resolved with treatment of sepsis.  Continue PPI.  Denies issues with gastroparesis.    Changed Reglan to p.o. route and wean off as able, decreased on 10/12 11.  Mild tachycardia: We will monitor for now.  Blood pressures reasonable.             Monitor HR with increased exertion.   Controlled on 10/12 12.  Leukocytosis: Resolved 13.  Anemia: Iron deficiency with low folate levels and elevated ferritin.  Add folic acid.  Likely needs IV iron for supplementation         Hgb 8.2 on 10/9  Continue to monitor 14.  Hyponatremia: We will continue to  monitor  Sodium 137 on 10/12 15.  Morbid obesity with malnutrition:  Protein supplements to promote wound healing.  Educated patient on importance of appropriate diet and weight loss to help promote health and activity.  Dietitian for diet education. 16. Hypocalcemia  Calcium 8.7 on 10/12, continue supplementation 17.  Urinary retention: Improved 18.  Hyperkalemia  Potassium 5.4 on 10/12  Kayexalate x1, labs ordered for tomorrow  LOS: 7 days A FACE TO FACE EVALUATION WAS PERFORMED  Ankit Lorie Phenix 02/16/2019, 11:14 AM

## 2019-02-17 ENCOUNTER — Inpatient Hospital Stay (HOSPITAL_COMMUNITY): Payer: 59 | Admitting: Occupational Therapy

## 2019-02-17 ENCOUNTER — Inpatient Hospital Stay (HOSPITAL_COMMUNITY): Payer: 59

## 2019-02-17 ENCOUNTER — Encounter (HOSPITAL_COMMUNITY): Payer: 59 | Admitting: Psychology

## 2019-02-17 ENCOUNTER — Inpatient Hospital Stay (HOSPITAL_COMMUNITY): Payer: 59 | Admitting: Physical Therapy

## 2019-02-17 DIAGNOSIS — E1165 Type 2 diabetes mellitus with hyperglycemia: Secondary | ICD-10-CM

## 2019-02-17 DIAGNOSIS — D62 Acute posthemorrhagic anemia: Secondary | ICD-10-CM

## 2019-02-17 DIAGNOSIS — L0390 Cellulitis, unspecified: Secondary | ICD-10-CM

## 2019-02-17 DIAGNOSIS — Z6841 Body Mass Index (BMI) 40.0 and over, adult: Secondary | ICD-10-CM

## 2019-02-17 DIAGNOSIS — E875 Hyperkalemia: Secondary | ICD-10-CM

## 2019-02-17 DIAGNOSIS — S88111S Complete traumatic amputation at level between knee and ankle, right lower leg, sequela: Secondary | ICD-10-CM

## 2019-02-17 DIAGNOSIS — G8918 Other acute postprocedural pain: Secondary | ICD-10-CM

## 2019-02-17 DIAGNOSIS — A419 Sepsis, unspecified organism: Secondary | ICD-10-CM

## 2019-02-17 LAB — SEDIMENTATION RATE: Sed Rate: >140 mm/hr — ABNORMAL HIGH (ref 0–22)

## 2019-02-17 LAB — C-REACTIVE PROTEIN: CRP: 1.3 mg/dL — ABNORMAL HIGH (ref <1.0)

## 2019-02-17 LAB — GLUCOSE, CAPILLARY
Glucose-Capillary: 122 mg/dL — ABNORMAL HIGH (ref 70–99)
Glucose-Capillary: 120 mg/dL — ABNORMAL HIGH (ref 70–99)
Glucose-Capillary: 163 mg/dL — ABNORMAL HIGH (ref 70–99)
Glucose-Capillary: 109 mg/dL — ABNORMAL HIGH (ref 70–99)

## 2019-02-17 LAB — BASIC METABOLIC PANEL
Anion gap: 7 (ref 5–15)
BUN: 17 mg/dL (ref 6–20)
CO2: 27 mmol/L (ref 22–32)
Calcium: 8.6 mg/dL — ABNORMAL LOW (ref 8.9–10.3)
Chloride: 101 mmol/L (ref 98–111)
Creatinine, Ser: 0.89 mg/dL (ref 0.44–1.00)
GFR calc Af Amer: >60 mL/min (ref >60)
GFR calc non Af Amer: >60 mL/min (ref >60)
Glucose, Bld: 127 mg/dL — ABNORMAL HIGH (ref 70–99)
Potassium: 5 mmol/L (ref 3.5–5.1)
Sodium: 135 mmol/L (ref 135–145)

## 2019-02-17 LAB — CBC WITH DIFFERENTIAL/PLATELET
Abs Immature Granulocytes: 0.03 10*3/uL (ref 0.00–0.07)
Basophils Absolute: 0.1 10*3/uL (ref 0.0–0.1)
Basophils Relative: 1 %
Eosinophils Absolute: 0.2 10*3/uL (ref 0.0–0.5)
Eosinophils Relative: 3 %
HCT: 25.7 % — ABNORMAL LOW (ref 36.0–46.0)
Hemoglobin: 7.6 g/dL — ABNORMAL LOW (ref 12.0–15.0)
Immature Granulocytes: 0 %
Lymphocytes Relative: 33 %
Lymphs Abs: 2.5 10*3/uL (ref 0.7–4.0)
MCH: 27 pg (ref 26.0–34.0)
MCHC: 29.6 g/dL — ABNORMAL LOW (ref 30.0–36.0)
MCV: 91.1 fL (ref 80.0–100.0)
Monocytes Absolute: 0.6 10*3/uL (ref 0.1–1.0)
Monocytes Relative: 8 %
Neutro Abs: 4.1 10*3/uL (ref 1.7–7.7)
Neutrophils Relative %: 55 %
Platelets: 435 10*3/uL — ABNORMAL HIGH (ref 150–400)
RBC: 2.82 MIL/uL — ABNORMAL LOW (ref 3.87–5.11)
RDW: 14.8 % (ref 11.5–15.5)
WBC: 7.6 10*3/uL (ref 4.0–10.5)
nRBC: 0 % (ref 0.0–0.2)

## 2019-02-17 NOTE — Progress Notes (Signed)
[place] PHYSICAL MEDICINE & REHABILITATION PROGRESS NOTE   Subjective/Complaints: Patient seen working with therapy planning.  She states she slept well overnight.  She notes she had some nausea last night, but that self resolved.  She has questions regarding labs and medications, discussed with therapy as well.  ROS-denies CP, SOB, N/V/D  Objective:   No results found. Recent Labs    02/17/19 0417  WBC 7.6  HGB 7.6*  HCT 25.7*  PLT 435*   Recent Labs    02/16/19 0350 02/17/19 0417  NA 137 135  K 5.4* 5.0  CL 101 101  CO2 29 27  GLUCOSE 113* 127*  BUN 19 17  CREATININE 1.06* 0.89  CALCIUM 8.7* 8.6*    Intake/Output Summary (Last 24 hours) at 02/17/2019 0855 Last data filed at 02/17/2019 0726 Gross per 24 hour  Intake 600 ml  Output -  Net 600 ml     Physical Exam: Vital Signs Blood pressure (!) 147/81, pulse 92, temperature 98 F (36.7 C), temperature source Oral, resp. rate 16, height 5\' 2"  (1.575 m), weight 120.1 kg, SpO2 95 %. Constitutional: No distress . Vital signs reviewed.  Morbidly obese. HENT: Normocephalic.  Atraumatic. Eyes: EOMI. No discharge. Cardiovascular: No JVD. Respiratory: Normal effort.  No stridor. GI: Non-distended. Skin: Right BKA with dressing C/D/I Psych: Normal mood.  Normal behavior. Musc: Right BKA with edema and tenderness. Neurological: Alert Motor: Bilateral upper extremities, left lower extremity 5/5 proximal distal Right lower extremity: Hip flexion 5/5, unchanged Assessment/Plan: 1. 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  Physiatrist is providing close team supervision and 24 hour management of active medical problems listed below.  Physiatrist and rehab team continue to assess barriers to discharge/monitor patient progress toward functional and medical goals  Care Tool:  Bathing    Body parts bathed by  patient: Right arm, Left arm, Chest, Abdomen, Front perineal area, Buttocks, Right upper leg, Left upper leg, Left lower leg, Face     Body parts n/a: Right lower leg   Bathing assist Assist Level: Set up assist     Upper Body Dressing/Undressing Upper body dressing   What is the patient wearing?: Pull over shirt    Upper body assist Assist Level: Supervision/Verbal cueing    Lower Body Dressing/Undressing Lower body dressing      What is the patient wearing?: Underwear/pull up, Pants     Lower body assist Assist for lower body dressing: Supervision/Verbal cueing     Toileting Toileting    Toileting assist Assist for toileting: Contact Guard/Touching assist     Transfers Chair/bed transfer  Transfers assist     Chair/bed transfer assist level: Contact Guard/Touching assist     Locomotion Ambulation   Ambulation assist      Assist level: Contact Guard/Touching assist Assistive device: Parallel bars Max distance: 6   Walk 10 feet activity   Assist  Walk 10 feet activity did not occur: Safety/medical concerns  Assist level: Minimal Assistance - Patient > 75% Assistive device: Walker-rolling   Walk 50 feet activity   Assist Walk 50 feet with 2 turns activity did not occur: Safety/medical concerns         Walk 150 feet activity   Assist Walk 150 feet activity did not occur: Safety/medical concerns         Walk 10 feet on uneven surface  activity   Assist Walk 10 feet on  uneven surfaces activity did not occur: Safety/medical concerns         Wheelchair     Assist Will patient use wheelchair at discharge?: Yes Type of Wheelchair: Manual    Wheelchair assist level: Set up assist Max wheelchair distance: 75    Wheelchair 50 feet with 2 turns activity    Assist        Assist Level: Set up assist   Wheelchair 150 feet activity     Assist      Assist Level: Supervision/Verbal cueing   Blood pressure (!)  147/81, pulse 92, temperature 98 F (36.7 C), temperature source Oral, resp. rate 16, height 5\' 2"  (1.575 m), weight 120.1 kg, SpO2 95 %.    Medical Problem List and Plan: 1.  Deficits with mobility and self-care secondary to short right BKA.  Continue CR 2.  Antithrombotics: -DVT/anticoagulation:  Pharmaceutical: Lovenox changed to heparin due to?  Side effect of hyperkalemia             -antiplatelet therapy: N/A 3. Pain Management: Oxycodone as needed.    Added low-dose gabapentin to augment pain management/phantom symptoms.               Monitor pain with increased activity.  Relatively controlled on 10/13 with medications 4. Mood: LCSW to follow for evaluation and support             -antipsychotic agents: N/A 5. Neuropsych: This patient is capable of making decisions on on her own behalf. 6. Skin/Wound Care: Wound VAC d/ced 10/10  Stump shrinker ordered, pending 7. Fluids/Electrolytes/Nutrition: Monitor I/O.   8.  Sepsis with bacteremia: Has been afebrile.  Continue ceftriaxone 2 g every 24 for 4 total weeks end date 03/04/2019 9. T2DM: Hgb A1C- 10.8.  Has not been on medications for years and now aware of importance of compliance.  Will monitor blood sugars AC/at bedtime.  Continue Lantus insulin and titrate upwards as indicated.  CBG (last 3)  Recent Labs    02/16/19 1654 02/16/19 2111 02/17/19 0616  GLUCAP 119* 120* 122*   Relatively controlled on 10/13 10. Nausea/vomiting: Has resolved with treatment of sepsis.  Continue PPI.  Denies issues with gastroparesis.    Changed Reglan to p.o. route and wean off as able, decreased on 10/12, plan to DC after today's doses 11.  Mild tachycardia: We will monitor for now.  Blood pressures reasonable.             Monitor HR with increased exertion.   Controlled on 10/13 12.  Leukocytosis: Resolved 13.  Anemia: Iron deficiency with low folate levels and elevated ferritin.  Add folic acid.  Likely needs IV iron for supplementation          Hgb 7.6 on 10/13, labs ordered for tomorrow  Continue to monitor 14.  Hyponatremia: We will continue to monitor  Sodium 135 on 10/13  15.  Morbid obesity with malnutrition:  Protein supplements to promote wound healing.  Educated patient on importance of appropriate diet and weight loss to help promote health and activity.  Dietitian for diet education. 16. Hypocalcemia  Calcium 8.6 on 10/13, continue supplementation 17.  Urinary retention: Improved 18.  Hyperkalemia  Potassium 5.0 on 10/13, labs ordered for tomorrow  Kayexalate x1 on 10/12  LOS: 8 days A FACE TO FACE EVALUATION WAS PERFORMED  Ankit Lorie Phenix 02/17/2019, 8:55 AM

## 2019-02-17 NOTE — Progress Notes (Signed)
Physical Therapy Session Note  Patient Details  Name: Sheila Austin MRN: [id number] Date of Birth: 10-28-1985  Today's Date: 02/17/2019 PT Individual Time: 1420-1533 PT Individual Time Calculation (min): 73 min   Short Term Goals: Week 1:  PT Short Term Goal 1 (Week 1): = LTGs  Skilled Therapeutic Interventions/Progress Updates:  Pt presented in w/c with fiancee Otis present agreeable to therapy. Pt propelled to ortho gym and educated Otis on pt's current level of function and  stand pivot transfers with RW. PTA demonstrated stand pivot with heavy duty walker and progressed to regular RW. Pt was able to perform all transfer with CGA to close S level. Otis educated on Psychologist, forensic. Pt propelled to rehab gym and educated on bumping up curb. Pt then transferred to mat and performed sidestepping at mat 36f x 2 with CGA transfer with RW to/from w/c. Pt then performed same activity in ADL apt ti simulate home environment. OHendricks [place]as able to provide appropriate cues and guarding as necessary. Pt propelled back to room at end of session and remained in w/c with call bell within reach and needs met.      Therapy Documentation Precautions:  Precautions Precautions: Fall Precaution Comments: wound VAC Required Braces or Orthoses: Other Brace Other Brace: R residual limb protector- does not fit Restrictions Weight Bearing Restrictions: Yes RLE Weight Bearing: Non weight bearing    Therapy/Group: Individual Therapy  Niamh Rada  Dhruva Orndoff, PTA  02/17/2019, 3:48 PM

## 2019-02-17 NOTE — Progress Notes (Signed)
Physical Therapy Session Note  Patient Details  Name: Sheila Austin MRN: [id number] Date of Birth: 03/07/1986  Today's Date: 02/17/2019 PT Individual Time: [id number] PT Individual Time Calculation (min): 59 min   Short Term Goals: Week 1:  PT Short Term Goal 1 (Week 1): = LTGs Week 2:    Week 3:     Skilled Therapeutic Interventions/Progress Updates:    PAIN: denies pain wc propulsion room to gym mod I inculding turns. Pt stated she was not yet comfortable w/standard RW but that she had only really used it once.   Sit to stand from wc w/cga. Gait 71ft w/rw w/vcs for safe distancing within walker (tended to get too close to front of walker) and to increase UE use for "push and sway" gait/to decrease impact forces.    wc to mat via lateral scoot w/supervision..sit to supine independently. Supine to side to prone independently on mat.  Preprosthetic exercises performed including:  Seated RLE knee extension x 20 Supine SLR x2 x 15 bilat sidelying hip abduction 2 x15 sidelying hip extension 2x15 Prone superman 5count x 10 Prone press upss x20 Supine edge of mat hip flexor stretch 2x1 min Supine bridge w/bolster under thighs/knees extended x20 Making good progress w/strength and demonstrates good ROM at hip.  Sidestepping at edge of mat length of mat x 4 w/cga, cues for increased use of UE's to decrease impact LLR.  More steady/fluent goingt to L vs R.  Standing balance activity - static stand without UE support w/RW available for recovery when needed.  Initially able to maintain 3-5 sec, w/repeated efforts able to achieve up to 15 sec.  Would benefit from continued balance challenges to prepare for prosthetic training.  Mat to wc w/supervision.  wc propulsion gym to room mod I.  Therapy Documentation Precautions:  Precautions Precautions: Fall Precaution Comments: wound VAC Required Braces or Orthoses: Other Brace Other Brace: R residual limb protector- does not  fit Restrictions Weight Bearing Restrictions: Yes RLE Weight Bearing: Non weight bearing    Therapy/Group: Individual Therapy  Callie Fielding, [place] 02/17/2019, 10:48 AM

## 2019-02-17 NOTE — Progress Notes (Signed)
Occupational Therapy Session Note  Patient Details  Name: Sheila Austin MRN: [id number] Date of Birth: October 18, 1985  Today's Date: 02/17/2019 OT Individual Time: [id number] OT Individual Time Calculation (min): 58 min    Short Term Goals: Week 1:  OT Short Term Goal 1 (Week 1): STGs=LTGs secondary to short estimated LOS  Skilled Therapeutic Interventions/Progress Updates:    Upon entering the room, pt supine in bed with no c/o pain this session and agreeable to OT intervention. Supine >sit independently with supervision for lateral scoot into wheelchair. Pt managing leg rests independently and propelling self to sink to brush teeth without assistance. Pt propelled self to shower room. OT provided education on side stepping with RW into bathroom and onto TTB. Pt returning demonstration side stepping with min guard 10' into bathroom and to bench. TTB transfer with close supervision for safety. OT discussed fall risks associated with shower and bathroom tasks. Pt verbalized understanding. Pt needing min guard again with RW side stepping to the L. Pt propelled wheelchair 500' up sloped surface and around furniture obstacles with supervision overall. Pt returning back to room at end of session with call bell and all needed item within reach.   Therapy Documentation Precautions:  Precautions Precautions: Fall Precaution Comments: wound VAC Required Braces or Orthoses: Other Brace Other Brace: R residual limb protector- does not fit Restrictions Weight Bearing Restrictions: Yes RLE Weight Bearing: Non weight bearing   Pain: Pain Assessment Pain Scale: 0-10 Pain Score: 0-No pain   Therapy/Group: Individual Therapy  [name] 02/17/2019, 11:00 AM

## 2019-02-17 NOTE — Consult Note (Signed)
Neuropsychological Consultation   Patient:   Sheila Austin   DOB:   [birth year]  MR Number:  [id number]  Location:  [facility] [address] C[facility] B [place] [id number] [place] [facility] [address] Dept: [place]: [phone]           Date of Service:   02/17/2019  Start Time:   1 PM End Time:   2 PM  Provider/Observer:  Ilean Skill, Psy.D.       Clinical Neuropsychologist       Billing Code/Service: [phone]  Chief Complaint:    Sheila Austin is a 33 year old female with a history of type 2 diabetes, hypertension, medical noncompliance for years, recent depressive episode due to loss of mother, morbid obesity, and fatty liver.  The patient developed a blister this past summer that developed into a left foot ulcer that she had difficulty being compliant taking care of due to the passing of her mother.  The patient continued to have poor wound healing and was ultimately admitted on 01/31/2019 due to progressive nausea, vomiting, drainage, foot pain, and weakness.  The patient was septic and treated with broad-spectrum antibiotics.  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  Once pain control and therapy evaluations were completed the patient was transferred to the comprehensive inpatient rehabilitation unit due to functional decline.    Reason for Service:  The patient was referred for neuropsychological consultation due to coping and adjustment issues for not only the recent right BKA and sepsis but also for issues associated with the recent death of her mother due to cancer.  Below is the HPI for the current admission.  HPI: Sheila Austin is a 33 year old female with history of T2DM,  HTN, medical non-compliance for years, recent depressive episode due to loss of mother, morbid  obesity , fatty liver who developed a blister this summer that ruptured with onset of left food ulcer treated with I & D 12/31/18 who left AMA post procedure due to family issues. History taken from chart review and patient. She continued to have poor wound healing despite conservative care and was admitted on  01/31/2019 due to progressive nausea, vomiting, drainage, foot pain, and weakness. She was found to be septic and treated with IVF as well broad spectrum antibiotics. MRI foot showed severe diffuse cellulitis with myofascitis, osteomyelitis of fifth metatarsal, cuboid and calcaneus as well as abscess extending of the peroneal tendon.  Dr. Sharol Given consulted for input and recommended IV antibiotics for another 24 hours followed by amputation.  Blood cultures positive for strep intermedius and wound cultures showed Klebsiella pneumonia.  ID recommended high dose Rocephin x4 weeks.   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 Sharol Given reported that patient had abscesses that extended through anterior and lateral compartment at level of amputation. Limb guard and stump shrinker ordered by [employer].  Fever resolving with repeat blood cultures negative so far.  Plans for PICC line placement on 02/07/2019. Hospital course further complicated by ABLA, with drop in hemoglobin on 10/03 to 6.6 and was transfused with I unit PRBC.  Pain control improving and therapy evaluations completed revealing deficits in mobility as well as ability to carry out ADLs.  CIR recommended due to functional decline. Please see preadmission assessment from earlier today as well.   Current Status:  The patient reports that her mood is generally positive and that she is able to effectively work on all of the therapeutic efforts being done.  She is expecting to be discharged on the 15th.  The patient reports that she has support from her fianc who has been visiting her here in the hospital and is gotten the house ready  for her when she is discharged.  The patient does not have a lot of family support outside of her fianc and his family.  The patient denies any significant depression or anxiety symptoms at this time.  She is doing better as far as her bereavement with the loss of her mother.  Behavioral Observation: Sheila Austin  presents as a 33 y.o.-year-old Right Caucasian Female who appeared her stated age. her dress was Appropriate and she was Well Groomed and her manners were Appropriate to the situation.  her participation was indicative of Appropriate and Attentive behaviors.  There were any physical disabilities noted.  she displayed an appropriate level of cooperation and motivation.     Interactions:    Active Appropriate and Attentive  Attention:   within normal limits and attention span and concentration were age appropriate  Memory:   normal; recent and remote memory intact  Visuo-spatial:  not examined  Speech (Volume):  normal  Speech:   normal; normal  Thought Process:  Coherent and Relevant  Though Content:  WNL; not suicidal and not homicidal  Orientation:   person, place, time/date and situation  Judgment:   Good  Planning:   Good  Affect:    Appropriate  Mood:    Dysphoric  Insight:   Good  Intelligence:   normal  Medical History:   Past Medical History:  Diagnosis Date  . Diabetes mellitus    uncontrolled, last A1c was 14  . E. coli sepsis ([place])   . Ectopic pregnancy   . Essential hypertension, benign 11/01/2008  . Fatty liver   . Gastroparesis   . GERD (gastroesophageal reflux disease)   . Helicobacter pylori gastritis 09/07/2011  . Migraine   . Morbid obesity with body mass index (BMI) of 40.0 to 49.9 ([place])   . Pulmonary edema   . Pyelonephritis   . Ureteral obstruction      Psychiatric History:  Patient has recently been dealing with bereavement after the death of her mother.  Family Med/Psych History:  Family History  Problem Relation Age of Onset   . Arthritis Mother   . Asthma Mother   . COPD Mother   . Depression Mother   . Diabetes Mother   . Hypertension Mother   . Mental illness Mother   . Kidney disease Mother   . Crohn's disease Mother   . Aneurysm Father 32       brain  . Mental illness Sister   . Arthritis Maternal Aunt   . Asthma Maternal Aunt   . COPD Maternal Aunt   . Depression Maternal Aunt   . Diabetes Maternal Aunt   . Hypertension Maternal Aunt   . Mental illness Maternal Aunt   . Stroke Maternal Aunt   . Heart failure Maternal Aunt   . Heart failure Maternal Grandfather   . Cancer Maternal Grandfather   . Diabetes Maternal Grandfather   . Heart disease Maternal Grandfather   . Hypertension Maternal Grandfather   . Hyperlipidemia Maternal Grandfather   . Mental illness Brother   . Diabetes Maternal Grandmother   . Heart disease Maternal Grandmother   .  Hypertension Maternal Grandmother   . Hyperlipidemia Maternal Grandmother   . Diabetes Paternal Grandmother   . Heart disease Paternal Grandmother   . Diabetes Paternal Grandfather   . Cancer Paternal Grandfather    Impression/DX:  Sheila Austin is a 33 year old female with a history of type 2 diabetes, hypertension, medical noncompliance for years, recent depressive episode due to loss of mother, morbid obesity, and fatty liver.  The patient developed a blister this past summer that developed into a left foot ulcer that she had difficulty being compliant taking care of due to the passing of her mother.  The patient continued to have poor wound healing and was ultimately admitted on 01/31/2019 due to progressive nausea, vomiting, drainage, foot pain, and weakness.  The patient was septic and treated with broad-spectrum antibiotics.  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  Once pain control and therapy evaluations  were completed the patient was transferred to the comprehensive inpatient rehabilitation unit due to functional decline.   The patient reports that her mood is generally positive and that she is able to effectively work on all of the therapeutic efforts being done.  She is expecting to be discharged on the 15th.  The patient reports that she has support from her fianc who has been visiting her here in the hospital and is gotten the house ready for her when she is discharged.  The patient does not have a lot of family support outside of her fianc and his family.  The patient denies any significant depression or anxiety symptoms at this time.  She is doing better as far as her bereavement with the loss of her mother.  Disposition/Plan:  The patient is expected to be discharged on 02/19/2019.  I discussed with the patient the possibility of following up with her outpatient if needed if the patient's depression worsened in any way.  I do not expect the patient to need any intervention like that but I have informed her that I would be available if needed.  Diagnosis:     Right BKA with recent death of mother and mild depression        Electronically Signed   _______________________ Ilean Skill, Psy.D.

## 2019-02-17 NOTE — Progress Notes (Signed)
Nutrition Education Note  RD consulted for education regarding a low-potassium diet. RD working remotely.  Potassium was 5.4 yesterday and 5.0 (WNL) today. Pt received 1 dose of Kayexalate on 10/12.  RD was unable to reach pt via phone call to room despite 4 separate attempts.  RD will attached "Lower-Potassium Foods List" handout from the Academy of [employer] Dietetics to pt's Discharge Instructions/AVS.  Body mass index is 48.43 kg/m. Pt meets criteria for obesity class III based on current BMI.  Current diet order is Carb Modified, patient is consuming approximately 100% of meals at this time. Labs and medications reviewed. No further nutrition interventions warranted at this time. RD contact information provided. If additional nutrition issues arise, please re-consult RD.   Gaynell Face, MS, RD, LDN Inpatient Clinical Dietitian Pager: [phone] Weekend/After Hours: [phone]

## 2019-02-18 ENCOUNTER — Inpatient Hospital Stay (HOSPITAL_COMMUNITY): Payer: 59 | Admitting: Occupational Therapy

## 2019-02-18 ENCOUNTER — Inpatient Hospital Stay (HOSPITAL_COMMUNITY): Payer: 59 | Admitting: Physical Therapy

## 2019-02-18 DIAGNOSIS — I10 Essential (primary) hypertension: Secondary | ICD-10-CM

## 2019-02-18 LAB — CBC WITH DIFFERENTIAL/PLATELET
Abs Immature Granulocytes: 0.02 10*3/uL (ref 0.00–0.07)
Basophils Absolute: 0.1 10*3/uL (ref 0.0–0.1)
Basophils Relative: 1 %
Eosinophils Absolute: 0.3 10*3/uL (ref 0.0–0.5)
Eosinophils Relative: 4 %
HCT: 26.3 % — ABNORMAL LOW (ref 36.0–46.0)
Hemoglobin: 7.9 g/dL — ABNORMAL LOW (ref 12.0–15.0)
Immature Granulocytes: 0 %
Lymphocytes Relative: 36 %
Lymphs Abs: 2.4 10*3/uL (ref 0.7–4.0)
MCH: 27.3 pg (ref 26.0–34.0)
MCHC: 30 g/dL (ref 30.0–36.0)
MCV: 91 fL (ref 80.0–100.0)
Monocytes Absolute: 0.6 10*3/uL (ref 0.1–1.0)
Monocytes Relative: 9 %
Neutro Abs: 3.3 10*3/uL (ref 1.7–7.7)
Neutrophils Relative %: 50 %
Platelets: 407 10*3/uL — ABNORMAL HIGH (ref 150–400)
RBC: 2.89 MIL/uL — ABNORMAL LOW (ref 3.87–5.11)
RDW: 15 % (ref 11.5–15.5)
WBC: 6.6 10*3/uL (ref 4.0–10.5)
nRBC: 0 % (ref 0.0–0.2)

## 2019-02-18 LAB — BASIC METABOLIC PANEL
Anion gap: 8 (ref 5–15)
BUN: 17 mg/dL (ref 6–20)
CO2: 27 mmol/L (ref 22–32)
Calcium: 8.9 mg/dL (ref 8.9–10.3)
Chloride: 103 mmol/L (ref 98–111)
Creatinine, Ser: 0.9 mg/dL (ref 0.44–1.00)
GFR calc Af Amer: >60 mL/min (ref >60)
GFR calc non Af Amer: >60 mL/min (ref >60)
Glucose, Bld: 108 mg/dL — ABNORMAL HIGH (ref 70–99)
Potassium: 4.9 mmol/L (ref 3.5–5.1)
Sodium: 138 mmol/L (ref 135–145)

## 2019-02-18 LAB — GLUCOSE, CAPILLARY
Glucose-Capillary: 122 mg/dL — ABNORMAL HIGH (ref 70–99)
Glucose-Capillary: 101 mg/dL — ABNORMAL HIGH (ref 70–99)
Glucose-Capillary: 120 mg/dL — ABNORMAL HIGH (ref 70–99)
Glucose-Capillary: 101 mg/dL — ABNORMAL HIGH (ref 70–99)

## 2019-02-18 MED ORDER — LISINOPRIL 5 MG PO TABS
2.5000 mg | ORAL_TABLET | Freq: Every day | ORAL | Status: DC
  Administered 2019-02-18 – 2019-02-19 (×2): 2.5 mg via ORAL
  Filled 2019-02-18 (×2): qty 1

## 2019-02-18 NOTE — Progress Notes (Signed)
Physical Therapy Discharge Summary  Patient Details  Name: Sheila Austin MRN: [id number] Date of Birth: [birth year]  Today's Date: 02/18/2019  Time: 1300-1400   Time Calculation: 60 min  Patient has met 7 of 7 long term goals due to increased strength, mobility, endurance, and standing and sitting balance and tolerance.  Patient to discharge at a wheelchair level Modified Independent.   Patient's care partner is independent to provide the necessary physical assistance at discharge.  Reasons goals not met: N/A, All goals met at D/C  Recommendation:  Patient will benefit from ongoing skilled PT services in home health setting to continue to advance safe functional mobility, address ongoing impairments in strength, mobility, endurance, and standing balance, and minimize fall risk.  Equipment: manual wheelchair, RW   Reasons for discharge: treatment goals met and discharge from hospital  Patient/family agrees with progress made and goals achieved: Yes  PT Discharge SPT performed discharge therapy session. See below for details. In addition, pt completed car transfer with supervision for safety. Pt completed 5xSTS with RW in 46.43 seconds. Pt educated on home exercises and provided a handout including sit to stands, wheelchair pushups, LAQ, bridges, glute squeezes, sidelying hip ABD/EXT. Pt returned to room at end of session, left sitting upright in chair with call bell within reach. All needs met at this time.   Precautions/Restrictions Precautions Precautions: Fall Restrictions Weight Bearing Restrictions: Yes RLE Weight Bearing: Non weight bearing Vital Signs Therapy Vitals Temp: 97.9 F (36.6 C) Temp Source: Oral Pulse Rate: 97 BP: (!) 145/90 Patient Position (if appropriate): Sitting Oxygen Therapy SpO2: 100 % O2 Device: Room Air Pain No pain noted  Vision/Perception  Not impaired    Cognition Overall Cognitive Status: Within Functional Limits for tasks  assessed Arousal/Alertness: Awake/alert Orientation Level: Oriented X4 Safety/Judgment: Appears intact Sensation Sensation Light Touch: Appears Intact Hot/Cold: Appears Intact Additional Comments: phantom pain/sensation on RLE Coordination Gross Motor Movements are Fluid and Coordinated: No Fine Motor Movements are Fluid and Coordinated: Yes Motor  Motor Motor - Skilled Clinical Observations: R BKA, generalized weakness Motor - Discharge Observations: R BKA with weakness but improved since eval  Mobility Bed Mobility Bed Mobility: Rolling Right;Rolling Left;Supine to Sit;Sit to Supine Rolling Right: Independent Rolling Left: Independent Supine to Sit: Independent Sit to Supine: Independent Transfers Transfers: Sit to Stand;Stand to [employer];Lateral/Scoot Transfers Sit to Stand: Supervision/Verbal cueing Stand to Sit: Supervision/Verbal cueing Stand Pivot Transfers: Contact Guard/Touching assist Lateral/Scoot Transfers: Supervision/Verbal cueing Transfer (Assistive device): Rolling walker Locomotion  Gait Gait Distance (Feet): 50 Feet Assistive device: Rolling walker Gait Gait: Yes Gait velocity: reduced Stairs / Additional Locomotion Stairs: No Wheelchair Mobility Wheelchair Mobility: Yes Wheelchair Assistance: Chartered loss adjuster: Both upper extremities Wheelchair Parts Management: Supervision/cueing Distance: 150 feet  Trunk/Postural Assessment  Cervical Assessment Cervical Assessment: Within Functional Limits Thoracic Assessment Thoracic Assessment: Within Functional Limits Lumbar Assessment Lumbar Assessment: Within Functional Limits Postural Control Postural Control: Deficits on evaluation  Balance Balance Balance Assessed: Yes Static Sitting Balance Static Sitting - Level of Assistance: 7: Independent Dynamic Sitting Balance Dynamic Sitting - Level of Assistance: 6: Modified independent (Device/Increase  time) Static Standing Balance Static Standing - Level of Assistance: 5: Stand by assistance Dynamic Standing Balance Dynamic Standing - Level of Assistance: 5: Stand by assistance;4: Min assist Extremity Assessment      RLE Assessment General Strength Comments: hip and knee grossly 3+/5 LLE Assessment General Strength Comments: hip and knee grossly 4/5  Olena Leatherwood, SPT  02/18/2019, 4:59 PM

## 2019-02-18 NOTE — Progress Notes (Signed)
[place] PHYSICAL MEDICINE & REHABILITATION PROGRESS NOTE   Subjective/Complaints: Patient seen working with therapy this morning.  She states this level overnight.  She states she is looking forward to discharge tomorrow.  She states she is managing her stump shrinker without difficulties.  ROS: Denies CP, SOB, N/V/D  Objective:   No results found. Recent Labs    02/17/19 0417 02/18/19 0459  WBC 7.6 6.6  HGB 7.6* 7.9*  HCT 25.7* 26.3*  PLT 435* 407*   Recent Labs    02/17/19 0417 02/18/19 0459  NA 135 138  K 5.0 4.9  CL 101 103  CO2 27 27  GLUCOSE 127* 108*  BUN 17 17  CREATININE 0.89 0.90  CALCIUM 8.6* 8.9    Intake/Output Summary (Last 24 hours) at 02/18/2019 0825 Last data filed at 02/18/2019 0747 Gross per 24 hour  Intake 490 ml  Output -  Net 490 ml     Physical Exam: Vital Signs Blood pressure (!) 143/86, pulse 97, temperature (!) 97.5 F (36.4 C), temperature source Oral, resp. rate 16, height 5\' 2"  (1.575 m), weight 120.1 kg, SpO2 96 %. Constitutional: No distress . Vital signs reviewed.  Morbidly obese. HENT: Normocephalic.  Atraumatic. Eyes: EOMI. No discharge. Cardiovascular: No JVD. Respiratory: Normal effort.  No stridor. GI: Non-distended. Skin: Right BKA with stump shrinker C/D/I Psych: Normal mood.  Normal behavior. Musc: Right short BKA with edema and tenderness Neurological: Alert Motor: Bilateral upper extremities, left lower extremity 5/5 proximal distal Right lower extremity: Hip flexion 5/5, stable  Assessment/Plan: 1. 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  Physiatrist is providing close team supervision and 24 hour management of active medical problems listed below.  Physiatrist and rehab team continue to assess barriers to discharge/monitor patient progress toward functional and medical goals  Care Tool:  Bathing     Body parts bathed by patient: Right arm, Left arm, Chest, Abdomen, Front perineal area, Buttocks, Right upper leg, Left upper leg, Left lower leg, Face     Body parts n/a: Right lower leg   Bathing assist Assist Level: Set up assist     Upper Body Dressing/Undressing Upper body dressing   What is the patient wearing?: Pull over shirt    Upper body assist Assist Level: Supervision/Verbal cueing    Lower Body Dressing/Undressing Lower body dressing      What is the patient wearing?: Underwear/pull up, Pants     Lower body assist Assist for lower body dressing: Supervision/Verbal cueing     Toileting Toileting    Toileting assist Assist for toileting: Contact Guard/Touching assist     Transfers Chair/bed transfer  Transfers assist     Chair/bed transfer assist level: Supervision/Verbal cueing     Locomotion Ambulation   Ambulation assist      Assist level: Contact Guard/Touching assist Assistive device: Walker-rolling Max distance: 15'   Walk 10 feet activity   Assist  Walk 10 feet activity did not occur: Safety/medical concerns  Assist level: Contact Guard/Touching assist Assistive device: Walker-rolling   Walk 50 feet activity   Assist Walk 50 feet with 2 turns activity did not occur: Safety/medical concerns  Assist level: Contact Guard/Touching assist Assistive device: Walker-rolling    Walk 150 feet activity   Assist Walk 150 feet activity did not occur: Safety/medical concerns         Walk 10 feet on uneven surface  activity  Assist Walk 10 feet on uneven surfaces activity did not occur: Safety/medical concerns         Wheelchair     Assist Will patient use wheelchair at discharge?: Yes Type of Wheelchair: Manual    Wheelchair assist level: Set up assist Max wheelchair distance: 75    Wheelchair 50 feet with 2 turns activity    Assist        Assist Level: Set up assist   Wheelchair 150 feet activity      Assist      Assist Level: Supervision/Verbal cueing   Blood pressure (!) 143/86, pulse 97, temperature (!) 97.5 F (36.4 C), temperature source Oral, resp. rate 16, height 5\' 2"  (1.575 m), weight 120.1 kg, SpO2 96 %.    Medical Problem List and Plan: 1.  Deficits with mobility and self-care secondary to short right BKA.  Continue CIR  Team conference today to discuss current and goals and coordination of care, home and environmental barriers, and discharge planning with nursing, case manager, and therapies.  2.  Antithrombotics: -DVT/anticoagulation:  Pharmaceutical: Lovenox changed to heparin due to?  Side effect of hyperkalemia             -antiplatelet therapy: N/A 3. Pain Management: Oxycodone as needed.    Added low-dose gabapentin to augment pain management/phantom symptoms.               Monitor pain with increased activity.  Relatively controlled on 10/14 with medications 4. Mood: LCSW to follow for evaluation and support             -antipsychotic agents: N/A 5. Neuropsych: This patient is capable of making decisions on on her own behalf. 6. Skin/Wound Care: Wound VAC d/ced 10/10  Cont stump shrinker 7. Fluids/Electrolytes/Nutrition: Monitor I/O.   8.  Sepsis with bacteremia: Has been afebrile.  Continue ceftriaxone 2 g every 24 for 4 total weeks end date 03/04/2019 9. T2DM: Hgb A1C- 10.8.  Has not been on medications for years and now aware of importance of compliance.  Will monitor blood sugars AC/at bedtime.  Continue Lantus insulin and titrate upwards as indicated.  CBG (last 3)  Recent Labs    02/17/19 1718 02/17/19 2056 02/18/19 0601  GLUCAP 163* 109* 122*   Slightly labile on 10/14 10. Nausea/vomiting: Has resolved with treatment of sepsis.  Continue PPI.  Denies issues with gastroparesis.    Changed Reglan to p.o. route and wean off as able, decreased on 10/12, plan to DC after today's doses 11.  Mild tachycardia: We will monitor for now.  Blood  pressures reasonable.             Monitor HR with increased exertion.   Controlled on 10/13 12.  Leukocytosis: Resolved 13.  Anemia: Iron deficiency with low folate levels and elevated ferritin.  Add folic acid.  Likely needs IV iron for supplementation         Hgb 7.9 on 10/14  Continue to monitor 14.  Hyponatremia: We will continue to monitor  Sodium 135 on 10/13  15. Morbid obesity with malnutrition:  Protein supplements to promote wound healing.  Educated patient on importance of appropriate diet and weight loss to help promote health and activity.  Dietitian for diet education. 16. Hypocalcemia  Calcium 8.6 on 10/13, continue supplementation 17.  Urinary retention: Improved 18.  Hyperkalemia  Potassium 4.9 on 10/14  Kayexalate x1 on 10/12 19.  Essential hypertension  Lisinopril 2.5 started on 10/14  LOS: 9 days  A FACE TO FACE EVALUATION WAS PERFORMED  Sheila Austin Lorie Phenix 02/18/2019, 8:25 AM

## 2019-02-18 NOTE — Progress Notes (Signed)
Social Work Patient ID: Sheila Austin, female   DOB: 1985-08-26, 33 y.o.   MRN: [id number]  Met with pt to discuss team conference progress toward her goals and readiness to discharge tomorrow. She feels ready and prepared to go home. Fiance has been trained and follow up arranged. Wait on tub bench she will see if can find one on her own. See in am for last minute questions.

## 2019-02-18 NOTE — Progress Notes (Signed)
Physical Therapy Session Note  Patient Details  Name: Sheila Austin MRN: [id number] Date of Birth: 08/20/1985  Today's Date: 02/18/2019 PT Individual Time: 3354-5625 and 1430-1500 PT Individual Time Calculation (min): 39 min and 30 min  Short Term Goals: Week 1:  PT Short Term Goal 1 (Week 1): = LTGs  Skilled Therapeutic Interventions/Progress Updates:  Tx1: Pt presented in bed agreeable to therapy. Session focused on functional mobility in preparation for d/c. Pt performed bed mobility independently and squat pivot to w/c mod I. Pt requesting to use toilet propelled to bathroom and performed toilet transfer distant supervision (+void). Propelled to ortho gym and performed car transfer with RW CGA demonstrating good safety. Propelled to ADL apt and performed furniture transfer with RW close S. Pt demonstrates good safety with w/c management throughout session. Pt ambulated 19f with RW and w/c follow CGA to close S. Pt propelled back to room at end of session and remained in w/c with call bell within reach and needs met.   Tx2: Pt presented in w/c agreeable to therapy. Pt propelled to day room mod I and participated in Wii bowling in standing for standing tolerance and balance. Pt was able to maintain good standing balance with RW and no LOB while actively swing Wii remove. Pt was also able to maintain standing through all 10 frames without break. After game pt propelled back to room without rest but significantly fatigued once back in room. Pt remained in w/c at end of session and left with call bell within reach and needs met.      Therapy Documentation Precautions:  Precautions Precautions: Fall Precaution Comments: wound VAC Required Braces or Orthoses: Other Brace Other Brace: R residual limb protector- does not fit Restrictions Weight Bearing Restrictions: Yes RLE Weight Bearing: Non weight bearing General:   Vital Signs:  Pain: Pain Assessment Pain Scale: 0-10 Pain Score:  0-No pain Pain Type: Surgical pain Pain Location: Leg Pain Orientation: Right Pain Descriptors / Indicators: Aching;Dull Pain Frequency: Intermittent Pain Onset: With Activity Pain Intervention(s): Medication (See eMAR)   Therapy/Group: Individual Therapy  Edwyna Dangerfield  Anniece Bleiler, PTA  02/18/2019, 8:46 AM

## 2019-02-18 NOTE — Patient Care Conference (Signed)
Inpatient RehabilitationTeam Conference and Plan of Care Update Date: 02/18/2019   Time: 10:35 AM    Patient Name: Sheila Austin Record Number: [id number]  Date of Birth: 1985-09-04 Sex: Female         Room/Bed: 4M03C/4M03C-01 Payor Info: Payor: [employer] / Plan: [place] [employer] / Product Type: *No Product type* /    Admit Date/Time:  02/09/2019  3:15 PM  Primary Diagnosis:  Unilateral complete BKA, right, initial encounter [facility])  Patient Active Problem List   Diagnosis Date Noted  . Benign essential HTN   . Unilateral complete BKA, right, sequela ([place])   . Hyperkalemia   . Hypocalcemia   . Postoperative pain   . Unilateral complete BKA, right, initial encounter ([place]) 02/09/2019  . Acquired absence of right leg below knee ([place])   . Hyponatremia   . Acute blood loss anemia   . Sinus tachycardia   . Post-operative pain   . Bacteremia   . Sepsis with acute organ dysfunction without septic shock ([place])   . 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[place])   . Streptococcal bacteremia   . Klebsiella pneumoniae infection   . Abscess of leg, right   . Skin ulcer of right foot with necrosis of muscle ([place])   . Severe protein-calorie malnutrition ([place])   . Diabetic foot ulcer with osteomyelitis ([place]) 02/01/2019  . Adjustment disorder 02/01/2019  . Sepsis due to cellulitis ([place]) 02/01/2019  . Obesity 12/31/2018  . Facial cellulitis 07/02/2018  . Class 3 severe obesity with body mass index (BMI) of 40.0 to 44.9 in adult ([place]) 07/09/2017  . Abscess of right breast 02/24/2016  . BMI 45.0-49.9, adult ([place]) 09/29/2015  . Skin lesions - right lower extremity 12/07/2013  . Morbid obesity with BMI of 50.0-59.9, adult ([place]) 10/05/2011  . Obstructive sleep apnea 10/05/2011  . Hypertriglyceridemia 09/27/2011  . Migraine 09/07/2011  . Essential hypertension, benign 11/01/2008  . Gastroparesis 09/27/2008  . IRREGULAR MENSES 08/24/2008  . Diabetes mellitus out of control ([place])  07/04/2006    Expected Discharge Date: Expected Discharge Date: 02/19/19  Team Members Present: Physician leading conference: Dr. Delice Lesch Social Worker Present: Ovidio Kin, LCSW Nurse Present: Romilda Garret, LPN PT Present: Barrie Folk, PT;Rosita Dechalus, PTA OT Present: Darleen Crocker, OT SLP Present: Stormy Fabian, SLP PPS Coordinator present : Gunnar Fusi, SLP     Current Status/Progress Goal Weekly Team Focus  Bowel/Bladder   continent of B/B LBM 10/12  Remain continent with normal bowel pattern  assist  prn laxatives prn   Swallow/Nutrition/ Hydration             ADL's   supervision/set up A for seated ADLs, CGA for standing balance and functional transfers with RW  min A - mod I (w/c level)  functional transfers, balance, self care tasks,endurance, strengthening   Mobility             Communication             Safety/Cognition/ Behavioral Observations            Pain   Right stump pain controlled with tylenol, tramadol, robaxin prn  pain <=2/10  medicate prn assess for relief   Skin   Right BKA stump with staples ecchymosis to surgical area  skin remains intact and free of infection  assess skin qshift and prn    Rehab Goals Patient on target to meet rehab goals: Yes Rehab Goals Revised: No *See  Care Plan and progress notes for long and short-term goals.     Barriers to Discharge  Current Status/Progress Possible Resolutions Date Resolved   Nursing                  PT                    OT                  SLP                SW                Discharge Planning/Teaching Needs:  Finance has been here and attending therapies with pt in prepartion for DC tomorrow. Setting up IV antibiotics for home until 10/28      Team Discussion:  Making progress toward her goals of mod/i wheelchair leve and CGA with short distances-gait and car transfers. Md has discharged reglan doing well. BS managed and pain managed. IV antibiotics until 10/28 BP  elevated started on meds today. Ready for DC tomorrow fiance trained and education completed.  Revisions to Treatment Plan:  DC 10/15    Medical Summary Current Status: Deficits with mobility and self-care secondary to short right BKA Weekly Focus/Goal: Improve mobility, CBGs, K+, BP, ABLA, hypocalemia, hyponatremia, bacteremia  Barriers to Discharge: IV antibiotics;Medical stability;Medication compliance   Possible Resolutions to Barriers: Therapies, optimize BP meds, follow labs, supplementing Ca++, cont IV abx   Continued Need for Acute Rehabilitation Level of Care: The patient requires daily medical management by a physician with specialized training in physical medicine and rehabilitation for the following reasons: Direction of a multidisciplinary physical rehabilitation program to maximize functional independence : Yes Medical management of patient stability for increased activity during participation in an intensive rehabilitation regime.: Yes Analysis of laboratory values and/or radiology reports with any subsequent need for medication adjustment and/or medical intervention. : Yes   I attest that I was present, lead the team conference, and concur with the assessment and plan of the team. Team conference was held via web/ teleconference due to [place], [place] 02/18/2019, 3:26 PM

## 2019-02-18 NOTE — Progress Notes (Addendum)
Occupational Therapy Discharge Summary  Patient Details  Name: Sheila Austin MRN: [id number] Date of Birth: 04/16/1986  Today's Date: 02/18/2019 OT Individual Time: 0930-1029 OT Individual Time Calculation (min): 59 min    Patient has met 11 of 11 long term goals due to improved activity tolerance, improved balance, postural control and ability to compensate for deficits.  Patient to discharge at overall mod I wheelchair level and CGA with RW level.  Patient's care partner is independent to provide the necessary physical assistance at discharge.    Reasons goals not met: all goals met  Recommendation:  Patient will benefit from ongoing skilled OT services in home health setting to continue to advance functional skills in the area of BADL and iADL.  Equipment: drop arm commode chair and TTB  Reasons for discharge: treatment goals met  Patient/family agrees with progress made and goals achieved: Yes   OT Intervention: Upon entering the room, pt seated in wheelchair with no c/o pain and excited about shower this session. Pt gathering all items from wheelchair level with mod I. Pt hops with use of RW to transfer onto TTB with CGA for safety. OT educates pt on proper way to cover R residual limb and PICC safely. Pt bathing at seated position in shower independently with use of LH sponge to increase independence. Lateral leans utilized to wash buttocks. Pt drying from seated position and donning clothing items while seated on TTB at mod I level. Pt performed stand pivot transfer into wheelchair with CGA and propelled wheelchair to sink for grooming tasks. Pt with no further concerns at this time. Call bell and all needed items within reach.   OT Discharge Precautions/Restrictions  Precautions Precautions: Fall Restrictions Weight Bearing Restrictions: Yes RLE Weight Bearing: Non weight bearing  Pain Pain Assessment Pain Scale: 0-10 Pain Score: 0-No pain Pain Type: Surgical  pain Pain Location: Leg Pain Orientation: Right Pain Descriptors / Indicators: Aching;Dull Pain Frequency: Intermittent Pain Onset: With Activity Pain Intervention(s): Medication (See eMAR) Vision Baseline Vision/History: No visual deficits Patient Visual Report: No change from baseline Cognition Overall Cognitive Status: Within Functional Limits for tasks assessed Arousal/Alertness: Awake/alert Orientation Level: Oriented X4 Sensation Sensation Light Touch: Appears Intact Hot/Cold: Appears Intact Additional Comments: phantom pain/sensation on RLE Coordination Gross Motor Movements are Fluid and Coordinated: No Fine Motor Movements are Fluid and Coordinated: Yes Motor  Motor Motor - Discharge Observations: R BKA with weakness but improved since eval Mobility  Bed Mobility Bed Mobility: Rolling Right;Rolling Left;Supine to Sit;Sit to Supine Rolling Right: Independent Rolling Left: Independent Supine to Sit: Independent Sit to Supine: Independent Transfers Sit to Stand: Supervision/Verbal cueing Stand to Sit: Supervision/Verbal cueing  Trunk/Postural Assessment  Cervical Assessment Cervical Assessment: Within Functional Limits Thoracic Assessment Thoracic Assessment: Within Functional Limits Lumbar Assessment Lumbar Assessment: Within Functional Limits Postural Control Postural Control: Deficits on evaluation  Balance Balance Balance Assessed: Yes Static Sitting Balance Static Sitting - Level of Assistance: 7: Independent Dynamic Sitting Balance Dynamic Sitting - Level of Assistance: 6: Modified independent (Device/Increase time) Static Standing Balance Static Standing - Level of Assistance: 5: Stand by assistance Dynamic Standing Balance Dynamic Standing - Level of Assistance: 5: Stand by assistance;4: Min assist Extremity/Trunk Assessment RUE Assessment RUE Assessment: Within Functional Limits LUE Assessment LUE Assessment: Within Functional  Limits   ,  P 02/18/2019, 10:00 AM 

## 2019-02-19 LAB — GLUCOSE, CAPILLARY
Glucose-Capillary: 102 mg/dL — ABNORMAL HIGH (ref 70–99)
Glucose-Capillary: 105 mg/dL — ABNORMAL HIGH (ref 70–99)

## 2019-02-19 MED ORDER — TRAMADOL HCL 50 MG PO TABS: 50.0000 mg | ORAL_TABLET | Freq: Four times a day (QID) | ORAL | 0 refills | Status: DC | PRN

## 2019-02-19 MED ORDER — HEPARIN SOD (PORK) LOCK FLUSH 100 UNIT/ML IV SOLN
250.0000 [IU] | INTRAVENOUS | Status: AC | PRN
  Administered 2019-02-19: 10:00:00 250 [IU]
  Filled 2019-02-19: qty 2.5

## 2019-02-19 MED ORDER — OXYCODONE HCL 5 MG PO TABS: 5.0000 mg | ORAL_TABLET | Freq: Three times a day (TID) | ORAL | 0 refills | Status: DC | PRN

## 2019-02-19 MED ORDER — INSULIN LISPRO (1 UNIT DIAL) 100 UNIT/ML (KWIKPEN): 0.0000 [IU] | PEN_INJECTOR | Freq: Three times a day (TID) | SUBCUTANEOUS | 0 refills | Status: DC

## 2019-02-19 MED ORDER — INSULIN PEN NEEDLE 31G X 6 MM MISC: 1.0000 "pen " | 0 refills | Status: DC

## 2019-02-19 MED ORDER — GABAPENTIN 100 MG PO CAPS: 100.0000 mg | ORAL_CAPSULE | Freq: Two times a day (BID) | ORAL | 0 refills | Status: DC

## 2019-02-19 MED ORDER — METHOCARBAMOL 500 MG PO TABS: 500.0000 mg | ORAL_TABLET | Freq: Four times a day (QID) | ORAL | 0 refills | Status: DC | PRN

## 2019-02-19 MED ORDER — LISINOPRIL 2.5 MG PO TABS: 2.5000 mg | ORAL_TABLET | Freq: Every day | ORAL | 0 refills | Status: DC

## 2019-02-19 MED ORDER — FOLIC ACID 1 MG PO TABS: 1.0000 mg | ORAL_TABLET | Freq: Every day | ORAL | 0 refills | Status: DC

## 2019-02-19 MED ORDER — LANTUS SOLOSTAR 100 UNIT/ML ~~LOC~~ SOPN: 25.0000 [IU] | PEN_INJECTOR | Freq: Every day | SUBCUTANEOUS | 0 refills | Status: DC

## 2019-02-19 MED FILL — UNIFINE PENTIPS 6MM 31G: 31G X 6 MM | 90 days supply | Qty: 100 | Fill #0

## 2019-02-19 MED FILL — METHOCARBAMOL 500 MG TABS: 500 | 15 days supply | Qty: 60 | Fill #0

## 2019-02-19 MED FILL — oxyCODONE HCL 5 MG TABS: 5 | 4 days supply | Qty: 21 | Fill #0

## 2019-02-19 MED FILL — traMADol HCL 50 MG TABS: 50 | 7 days supply | Qty: 28 | Fill #0

## 2019-02-19 MED FILL — HUMALOG 100 UNITS/ML KWIKPE: 100 | 63 days supply | Qty: 15 | Fill #0

## 2019-02-19 MED FILL — FOLIC ACID 1 MG TABS: 1 | 30 days supply | Qty: 30 | Fill #0

## 2019-02-19 MED FILL — LANTUS SOLOSTAR 100 UNITS/M: 100 | 60 days supply | Qty: 15 | Fill #0

## 2019-02-19 MED FILL — GABAPENTIN 100 MG CAPSULE: 100 | 30 days supply | Qty: 60 | Fill #0

## 2019-02-19 MED FILL — LISINOPRIL 2.5 MG TABLET: 2.5 | 30 days supply | Qty: 30 | Fill #0

## 2019-02-19 NOTE — Progress Notes (Signed)
Social Work Discharge Note   The overall goal for the admission was met for:   Discharge location: [place] EDUCATED ON HER CARE AND WILL BE THERE 24 HR SHORT TIME  Length of Stay: Yes-10 DAYS  Discharge activity level: Yes-MOD/I WHEELCHAIR LEVEL AND SUPERVISION-GAIT  Home/community participation: Yes  Services provided included: MD, RD, PT, OT, RN, CM, TR, Pharmacy, Neuropsych and SW  Financial Services: Private Insurance: UMR  Follow-up services arranged: Home Health: [facility], DME: [facility], [facility] [place] and Patient/Family has no preference for HH/DME agencies BEDSIDE COMMODE WAS NOT COVERED BY PT'S INSURANCE WILL USE MOTHER IN-LAWS.  Comments (or additional information):OTIS WAS HERE AND LEARNED PT'S CARE, SHE DID WELL AND REACHED MOD/I LEVEL WHEELCHAIR. BOTH FEEL COMFORTABLE WITH DISCHARGE HOME TODAY.  Patient/Family verbalized understanding of follow-up arrangements: Yes  Individual responsible for coordination of the follow-up plan: SELF & OTIS-FIANCE  Confirmed correct DME delivered: Elease Hashimoto 02/19/2019    Elease Hashimoto

## 2019-02-19 NOTE — Progress Notes (Signed)
[place] PHYSICAL MEDICINE & REHABILITATION PROGRESS NOTE   Subjective/Complaints: Patient seen laying in bed this morning.  She states she slept well overnight.  She notes that she still has some drainage along her lateral stump incision.  She is ready for discharge.  ROS: Denies CP, SOB, N/V/D  Objective:   No results found. Recent Labs    02/17/19 0417 02/18/19 0459  WBC 7.6 6.6  HGB 7.6* 7.9*  HCT 25.7* 26.3*  PLT 435* 407*   Recent Labs    02/17/19 0417 02/18/19 0459  NA 135 138  K 5.0 4.9  CL 101 103  CO2 27 27  GLUCOSE 127* 108*  BUN 17 17  CREATININE 0.89 0.90  CALCIUM 8.6* 8.9    Intake/Output Summary (Last 24 hours) at 02/19/2019 0850 Last data filed at 02/18/2019 1730 Gross per 24 hour  Intake 360 ml  Output -  Net 360 ml     Physical Exam: Vital Signs Blood pressure (!) 145/90, pulse 97, temperature 98.5 F (36.9 C), temperature source Oral, resp. rate 17, height 5\' 2"  (1.575 m), weight 120.1 kg, SpO2 100 %. Constitutional: No distress . Vital signs reviewed.  Morbidly obese. HENT: Normocephalic.  Atraumatic. Eyes: EOMI. No discharge. Cardiovascular: No JVD. Respiratory: Normal effort.  No stridor. GI: Non-distended. Skin: Right BKA with serosanguineous drainage along lateral incision Psych: Normal mood.  Normal behavior. Musc: Right short BKA with edema and tenderness  Neurological: Alert Motor: Bilateral upper extremities, left lower extremity 5/5 proximal distal Right lower extremity: Hip flexion 5/5, unchanged  Assessment/Plan: 1. 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  Physiatrist is providing close team supervision and 24 hour management of active medical problems listed below.  Physiatrist and rehab team continue to assess barriers to discharge/monitor patient progress toward functional and medical goals  Care  Tool:  Bathing    Body parts bathed by patient: Right arm, Left arm, Chest, Abdomen, Front perineal area, Buttocks, Right upper leg, Left upper leg, Left lower leg, Face, Right lower leg     Body parts n/a: Right lower leg   Bathing assist Assist Level: Independent with assistive device Assistive Device Comment: LH sponge   Upper Body Dressing/Undressing Upper body dressing   What is the patient wearing?: Pull over shirt    Upper body assist Assist Level: Independent    Lower Body Dressing/Undressing Lower body dressing      What is the patient wearing?: Underwear/pull up, Pants     Lower body assist Assist for lower body dressing: Independent     Toileting Toileting    Toileting assist Assist for toileting: Contact Guard/Touching assist     Transfers Chair/bed transfer  Transfers assist     Chair/bed transfer assist level: Supervision/Verbal cueing     Locomotion Ambulation   Ambulation assist      Assist level: Contact Guard/Touching assist Assistive device: Walker-rolling Max distance: 107ft   Walk 10 feet activity   Assist  Walk 10 feet activity did not occur: Safety/medical concerns  Assist level: Contact Guard/Touching assist Assistive device: Walker-rolling   Walk 50 feet activity   Assist Walk 50 feet with 2 turns activity did not occur: Safety/medical concerns  Assist level: Contact Guard/Touching assist Assistive device: Walker-rolling    Walk 150 feet activity   Assist Walk 150 feet activity did not occur: Safety/medical concerns         Walk 10 feet  on uneven surface  activity   Assist Walk 10 feet on uneven surfaces activity did not occur: Safety/medical concerns         Wheelchair     Assist Will patient use wheelchair at discharge?: Yes Type of Wheelchair: Manual    Wheelchair assist level: Set up assist Max wheelchair distance: 162ft    Wheelchair 50 feet with 2 turns activity    Assist         Assist Level: Set up assist   Wheelchair 150 feet activity     Assist      Assist Level: Set up assist   Blood pressure (!) 145/90, pulse 97, temperature 98.5 F (36.9 C), temperature source Oral, resp. rate 17, height 5\' 2"  (1.575 m), weight 120.1 kg, SpO2 100 %.    Medical Problem List and Plan: 1.  Deficits with mobility and self-care secondary to short right BKA.  DC today  Will see patient for transitional care management in 1-2 weeks post-discharge 2.  Antithrombotics: -DVT/anticoagulation:  Pharmaceutical: Lovenox changed to heparin due to?  Side effect of hyperkalemia             -antiplatelet therapy: N/A 3. Pain Management: Oxycodone as needed.    Added low-dose gabapentin to augment pain management/phantom symptoms.               Monitor pain with increased activity.  Relatively controlled on 10/15 with medications 4. Mood: LCSW to follow for evaluation and support             -antipsychotic agents: N/A 5. Neuropsych: This patient is capable of making decisions on on her own behalf. 6. Skin/Wound Care: Wound VAC d/ced 10/10  Cont stump shrinker 7. Fluids/Electrolytes/Nutrition: Monitor I/O.   8.  Sepsis with bacteremia: Has been afebrile.  Continue ceftriaxone 2 g every 24 for 4 total weeks end date 03/04/2019 9. T2DM: Hgb A1C- 10.8.  Has not been on medications for years and now aware of importance of compliance.  Will monitor blood sugars AC/at bedtime.  Continue Lantus insulin and titrate upwards as indicated.  CBG (last 3)  Recent Labs    02/18/19 1647 02/18/19 2108 02/19/19 0611  GLUCAP 120* 101* 102*   Controlled on 10/15 10. Nausea/vomiting: Has resolved with treatment of sepsis.  Continue PPI.  Denies issues with gastroparesis.    Changed Reglan to p.o. route and wean off as able, DC 11.  Mild tachycardia: We will monitor for now.  Blood pressures reasonable.             Monitor HR with increased exertion.   Controlled on 10/15 12.   Leukocytosis: Resolved 13.  Anemia: Iron deficiency with low folate levels and elevated ferritin.  Add folic acid.  Likely needs IV iron for supplementation         Hgb 7.9 on 10/14  Continue to monitor 14.  Hyponatremia: We will continue to monitor  Sodium 135 on 10/13  15. Morbid obesity with malnutrition:  Protein supplements to promote wound healing.  Educated patient on importance of appropriate diet and weight loss to help promote health and activity.  Dietitian for diet education. 16. Hypocalcemia  Calcium 8.6 on 10/13, continue supplementation 17.  Urinary retention: Improved 18.  Hyperkalemia  Potassium 4.9 on 10/14  Kayexalate x1 on 10/12 19.  Essential hypertension  Lisinopril 2.5 started on 10/14  Improved, but remains slightly elevated, will need further ambulatory monitoring and potential adjustments.  LOS: 10 days A FACE TO  FACE EVALUATION WAS PERFORMED  Ankit Lorie Phenix 02/19/2019, 8:50 AM

## 2019-02-19 NOTE — Progress Notes (Signed)
Patient for discharge today per wheelchair to be accompanied by NT and fiancee; Discharge instructions done by PAM PA; Patient going home with picc for ABT at home; Home health Nurse here to see patient.

## 2019-02-19 NOTE — Progress Notes (Signed)
Physical Therapy Note  Patient Details  Name: Sheila Austin [place] MRN: [id number] Date of Birth: 30-Aug-1985 Today's Date: 02/19/2019   Pt will greatly benefit from Wide RW, Wide BSC and and 20x18 WC due to body habitus  With BMI of 48.43 to allow improved sitting tolerance, pressure relief and improved safety for ADLs and funcitonal mobility tasks.   Lorie Phenix 02/19/2019, 8:35 AM

## 2019-02-19 NOTE — Discharge Summary (Signed)
Physician Discharge Summary  Patient ID: Sheila Austin MRN: [id number] DOB/AGE: October 27, 1985 33 y.o.  Admit date: 02/09/2019 Discharge date: 02/19/2019  Discharge Diagnoses:  Principal Problem:   Unilateral complete BKA, right, initial encounter [facility]) Active Problems:   Diabetes mellitus out of control ([place])   Morbid obesity with BMI of 50.0-59.9, adult (HCC)   Severe protein-calorie malnutrition (HCC)   Unilateral complete BKA, right, sequela (HCC)   Benign essential HTN   Discharged Condition:  Stable   Significant Diagnostic Studies: N/A   Labs:  Basic Metabolic Panel: Recent Labs  Lab 02/16/19 0350 02/17/19 0417 02/18/19 0459  NA 137 135 138  K 5.4* 5.0 4.9  CL 101 101 103  CO2 29 27 27   GLUCOSE 113* 127* 108*  BUN 19 17 17   CREATININE 1.06* 0.89 0.90  CALCIUM 8.7* 8.6* 8.9    CBC: Recent Labs  Lab 02/17/19 0417 02/18/19 0459  WBC 7.6 6.6  NEUTROABS 4.1 3.3  HGB 7.6* 7.9*  HCT 25.7* 26.3*  MCV 91.1 91.0  PLT 435* 407*    CBG: Recent Labs  Lab 02/18/19 1125 02/18/19 1647 02/18/19 2108 02/19/19 0611 02/19/19 1152  GLUCAP 101* 120* 101* 102* 105*    Brief HPI:   Sheila Austin is a 33 y.o. female with history of T2DM, HTN medication non-compliance for years, fatty liver, morbid obesity who developed left foot ulcer treated with I and D 12/31/18. She continued to have poor healing despite conservative care and was admitted on 01/31/19 with progressive N/V, left foot pain and wound drainage. She was found to be septic with evidence of extensive osteomyelitis and extensive foot abscess with myositis. Blood cultures positive for step intermedius and wound cultures showed Klebsiella pneumoniae. She was started on IV antibiotics and underwent R-BKA on 02/04/19.  Id recommended high dose rocephin X 4 weeks for treatment. Post op course significant for fevers, ABLA with drop in Hgb to 6.6 requiring one unit PRBC as well as debility. CIR recommended due to  functional decline.    Hospital Course: Sheila Austin was admitted to rehab 02/09/2019 for inpatient therapies to consist of PT and OT at least three hours five days a week. Past admission physiatrist, therapy team and rehab RN have worked together to provide customized collaborative inpatient rehab. She has been maintained on IV rocephin and PICC line was placed on 10/09 due to need for prolonged antibiotics. She is tolerating rocephin without SE.  Serial check of lytes showed upward trend in potassium with hyperkalemia. This was discussed with pharmacy who questioned Lovenox as cause therefore this was changed to SQ heparin. She also received a dose of kayexalate and K+ is trending down. She has been educated on CM and low potassium dietary restrictions.  Serial CBC showed that leucocytosis has resolved and anemia of chronic illness is stable. Tums was added to supplement low calcium levels. GI symptoms have resolved with treatment of sepsis and Reglan has been weaned off.   She did have transient urinary retention whic has resolves with addition of low dose urecholine and improvement in mobility. Her blood pressures were monitored on TID basis and and were noted to be labile therefore low dose lisinopril was added for better control on 02/18/19.  Recommend repeat BMET in one week to monitor renal status and potassium levels with addition ofn ACE. Her diabetes has been monitored with ac/hs CBG checks and SSI was use prn for tighter BS control. BS are well controlled and stable on Lantus  25 units daily. She has been educated on importance of tight BS control and is to use SSI additional prn for elevations after discharge. Prevena wound VAC was discontinued and R-BKA has minimal serosanguinous drainage form lateral aspect of incision. Residual limb is edematous and incision is C/D/I. OIC has resolved with set up of bowel regimen. Pain control has greatly improved with decrease in use of narcotics. She is  continent of bowel and bladder. She has made gains during rehab stay and is currently modified independent at wheelchair level.  She will continue to receive follow up [place] [place] [facility] [place] [facility] after discharge.  Rehab course: During patient's stay in rehab weekly team conferences were held to monitor patient's progress, set goals and discuss barriers to discharge. At admission, patient required min to mod assist with mobility and mod assist with basic self care tasks. She  has had improvement in activity tolerance, balance, postural control as well as ability to compensate for deficits. She is able to complete ADL tasks at modified independent level. She is independent for transfers and is able to ambulate 25' with RW and CGA. Family education was completed with significant other who will provide assistance as needed after discharge.    Disposition: Home   Diet: Carb modified medium.   Special Instructions: 1. Recommend serial follow up of Lytes to monitor potassium level as well as CBC to monitor H/H. 2. [place] BS ac/hs. Use SSI as advised.  3. Cleanse incision with soap and water. Pat dry and wear stump shriner-- on wound with dressing on top prn drainage. Marland Kitchen   Discharge Instructions    Home infusion instructions [facility] May follow [place] Dosing Protocol; May administer Cathflo as needed to maintain patency of vascular access device.; Flushing of vascular access device: per [facility] Protocol: 0.9% NaCl pre/post medica...   Complete by: As directed    Instructions: May follow [place] Dosing Protocol   Instructions: May administer Cathflo as needed to maintain patency of vascular access device.   Instructions: Flushing of vascular access device: per [facility] Protocol: 0.9% NaCl pre/post medication administration and prn patency; Heparin 100 u/ml, 85m for implanted ports and Heparin 10u/ml, 544mfor all other central venous catheters.   Instructions: May follow [facility]  Anaphylaxis Protocol for First Dose Administration in the home: 0.9% NaCl at 25-50 ml/hr to maintain IV access for protocol meds. Epinephrine 0.3 ml IV/IM PRN and Benadryl 25-50 IV/IM PRN s/s of anaphylaxis.   Instructions: [facility] Coordinator (RN) to assist per patient IV care needs in the home PRN.     Allergies as of 02/19/2019      Reactions   Bee Venom Anaphylaxis, Hives, Shortness Of Breath, Swelling   Penicillins Itching   Has patient had a PCN reaction causing immediate rash, facial/tongue/throat swelling, SOB or lightheadedness with hypotension: yes Has patient had a PCN reaction causing severe rash involving mucus membranes or skin necrosis: unknown Has patient had a PCN reaction that required hospitalization : yes Has patient had a PCN reaction occurring within the last 10 years: no If all of the above answers are "NO", then may proceed with Cephalosporin use.      Medication List    STOP taking these medications   cefTRIAXone 2 g in sodium chloride 0.9 % 100 mL   escitalopram 10 MG tablet Commonly known as: Lexapro   feeding supplement (GLUCERNA SHAKE) Liqd   heparin 5000 UNIT/ML injection   insulin aspart  100 UNIT/ML injection Commonly known as: novoLOG   metoCLOPramide 5 MG tablet Commonly known as: REGLAN   ondansetron 4 MG tablet Commonly known as: ZOFRAN     TAKE these medications   acetaminophen 325 MG tablet Commonly known as: TYLENOL Take 1-2 tablets (325-650 mg total) by mouth every 4 (four) hours as needed for mild pain. What changed:   when to take this  reasons to take this   alum & mag hydroxide-simeth 200-200-20 MG/5ML suspension Commonly known as: MAALOX/MYLANTA Take 30 mLs by mouth every 4 (four) hours as needed for indigestion.   blood glucose meter kit and supplies Dispense based on patient and insurance preference. Use up to four times daily as directed. (FOR ICD-10 E10.9, E11.9).   cefTRIAXone  IVPB Commonly  known as: ROCEPHIN Inject 2 g into the vein daily for 21 days. Indication:  Deep wound infection/bacteremia Last Day of Therapy:  03/04/2019 Labs - Once weekly:  CBC/D and BMP, Labs - Every other week:  ESR and CRP   docusate sodium 100 MG capsule Commonly known as: COLACE Take 1 capsule (100 mg total) by mouth 2 (two) times daily.   feeding supplement (PRO-STAT SUGAR FREE 64) Liqd Take 30 mLs by mouth 2 (two) times daily.   folic acid 1 MG tablet Commonly known as: FOLVITE Take 1 tablet (1 mg total) by mouth daily.   gabapentin 100 MG capsule Commonly known as: NEURONTIN Take 1 capsule (100 mg total) by mouth 2 (two) times daily.   insulin lispro 100 UNIT/ML KwikPen Commonly known as: HumaLOG KwikPen Inject 0-0.08 mLs (0-8 Units total) into the skin 3 (three) times daily before meals. As directed What changed:   how much to take  additional instructions Notes to patient: This is the short acting insulin--take with meals if blood sugars are over 80 and you plan on eating at least 50% of your meal   Insulin Pen Needle 31G X 6 MM Misc 1 pen by Does not apply route as directed.   Lantus SoloStar 100 UNIT/ML Solostar Pen Generic drug: Insulin Glargine Inject 25 Units into the skin daily. Notes to patient: This is the long acting insulin.    lisinopril 2.5 MG tablet Commonly known as: ZESTRIL Take 1 tablet (2.5 mg total) by mouth daily.   methocarbamol 500 MG tablet Commonly known as: Robaxin Take 1 tablet (500 mg total) by mouth every 6 (six) hours as needed for muscle spasms.   Muscle Rub 10-15 % Crea Apply 1 application topically QID.   oxyCODONE 5 MG immediate release tablet--Rx 21 pills Commonly known as: Oxy IR/ROXICODONE Take 1-2 tablets (5-10 mg total) by mouth every 8 (eight) hours as needed for moderate pain (pain score 4-6). What changed:   when to take this  Another medication with the same name was removed. Continue taking this medication, and follow  the directions you see here. Notes to patient: Use this for SEVERE PAIN AS NEEDED   pantoprazole 40 MG tablet Commonly known as: PROTONIX Take 1 tablet (40 mg total) by mouth daily.   traMADol 50 MG tablet--Rx# 28 pills Commonly known as: ULTRAM Take 1 tablet (50 mg total) by mouth every 6 (six) hours as needed for moderate pain. Notes to patient: USE FOR MODERATE PAIN AS NEEDED            Home Infusion Instuctions  (From admission, onward)         Start     Ordered   02/13/19 0000  Home infusion instructions [facility] May follow [place] Dosing Protocol; May administer Cathflo as needed to maintain patency of vascular access device.; Flushing of vascular access device: per [facility] Protocol: 0.9% NaCl pre/post medica...    Question Answer Comment  Instructions May follow [place] Dosing Protocol   Instructions May administer Cathflo as needed to maintain patency of vascular access device.   Instructions Flushing of vascular access device: per [facility] Protocol: 0.9% NaCl pre/post medication administration and prn patency; Heparin 100 u/ml, 49m for implanted ports and Heparin 10u/ml, 569mfor all other central venous catheters.   Instructions May follow [facility] Anaphylaxis Protocol for First Dose Administration in the home: 0.9% NaCl at 25-50 ml/hr to maintain IV access for protocol meds. Epinephrine 0.3 ml IV/IM PRN and Benadryl 25-50 IV/IM PRN s/s of anaphylaxis.   Instructions [facility] Infusion Coordinator (RN) to assist per patient IV care needs in the home PRN.      02/13/19 0913         Follow-up Information    PaJamse ArnMD Follow up.   Specialty: Physical Medicine and Rehabilitation Why: Office will call you with follow up appointment Contact information: [id number] [phone]        MoMaximiano CossNP. Call on 02/20/2019.   Specialty: Adult Health Nurse Practitioner Why: for post hospital follow  up Contact information: [id number] [phone]      DuNewt MinionMD. Call on 02/20/2019.   Specialty: Orthopedic Surgery Why: for post op check Contact information: [phone]         Signed: PaBary Leriche0/16/2020, 8:51 AM

## 2019-02-19 NOTE — Discharge Instructions (Signed)
Inpatient Rehab Discharge Instructions  Sheila Austin Discharge date and time: 02/19/19   Activities/Precautions/ Functional Status: Activity: no lifting, driving, or strenuous exercise for till cleared by MD Diet: diabetic diet and low fat, low cholesterol diet Wound Care: Cleanse with antibiotic soap and water. Pat dry then cover with black shrinker onto to wound. Apply dressing on top of the shrinker if you have any drainage. Contact Dr. Sharol Given if you develop any problems with your incision/wound--redness, swelling, increase in pain, drainage or if you develop fever or chills.    Functional status:  ___ No restrictions     ___ Walk up steps independently ___ 24/7 supervision/assistance   ___ Walk up steps with assistance _X__ Intermittent supervision/assistance  ___ Bathe/dress independently ___ Walk with walker     ___ Bathe/dress with assistance ___ Walk Independently    ___ Shower independently ___ Walk with assistance    ___ Shower with assistance _X__ No alcohol     ___ Return to work/school ________  Special Instructions: 1. Monitor blood sugars before meals and at bedtime. Lantus (Insulin gargline is the long acting insulin) For blood sugars in below range use Humalog insulin.  For 100-150 use 2 units,  For 151-200 use 4 units For 201-250 use 6 units IF they start running over 200 you need to call your PCP--they have been running 100- 120 in the hospital.   COMMUNITY REFERRALS UPON DISCHARGE:    Home Health:   PT, OT, RN  Agency:[facility]   Phone:[phone]   Date of last service:02/19/2019  Medical Equipment/Items Ordered:WHEELCHAIR, [place]IDE [place]  Agency/Supplier:[facility]   [phone]     My questions have been answered and I understand these instructions. I will adhere to these goals and the provided educational materials after my discharge from the hospital.  Patient/Caregiver Signature  _______________________________ Date __________  Clinician Signature _______________________________________ Date __________  Please bring this form and your medication list with you to all your follow-up doctor's appointments.                            Lower-Potassium Foods List   Food Amount Potassium (milligrams)  Applesauce  cup 90  Green beans  cup 90  Peas, green, frozen  cup 90  Raspberries  cup 90  Watermelon  cup 85  Cucumbers  cup 80  Oatmeal, regular, quick, or instant, unenriched  cup 80  Rice, wild  cup 80  Bread, whole wheat 1 slice 70  English muffin 1 each 65  Tea, brewed 6 oz 65  Blueberries  cup 60  Egg 1 large 60  Eggplant  cup 60  Rice, white or brown  cup 50  Tortilla, flour or corn 1 each 50  Cranberries  cup 45  Waffle, 4" 1 each 40  Bagel, 4": egg or plain  bagel 35  Nectar: papaya, mango, or pear  cup (4 oz) 35  Hummus 1 tbsp 32  Bread, white 1 slice 30  Spaghetti or macaroni, cooked  cup 30  Cheese 1 oz 20-30  Cranberry juice cocktail  cup (4 oz) 20

## 2019-02-20 ENCOUNTER — Other Ambulatory Visit: Payer: Self-pay | Admitting: *Deleted

## 2019-02-20 DIAGNOSIS — E1122 Type 2 diabetes mellitus with diabetic chronic kidney disease: Secondary | ICD-10-CM | POA: Diagnosis not present

## 2019-02-20 DIAGNOSIS — Z89511 Acquired absence of right leg below knee: Secondary | ICD-10-CM | POA: Diagnosis not present

## 2019-02-20 DIAGNOSIS — T8743 Infection of amputation stump, right lower extremity: Secondary | ICD-10-CM | POA: Diagnosis not present

## 2019-02-20 DIAGNOSIS — N184 Chronic kidney disease, stage 4 (severe): Secondary | ICD-10-CM | POA: Diagnosis not present

## 2019-02-20 DIAGNOSIS — M869 Osteomyelitis, unspecified: Secondary | ICD-10-CM | POA: Diagnosis not present

## 2019-02-20 DIAGNOSIS — E1169 Type 2 diabetes mellitus with other specified complication: Secondary | ICD-10-CM | POA: Diagnosis not present

## 2019-02-20 DIAGNOSIS — I129 Hypertensive chronic kidney disease with stage 1 through stage 4 chronic kidney disease, or unspecified chronic kidney disease: Secondary | ICD-10-CM | POA: Diagnosis not present

## 2019-02-20 DIAGNOSIS — A419 Sepsis, unspecified organism: Secondary | ICD-10-CM | POA: Diagnosis not present

## 2019-02-20 DIAGNOSIS — T8789 Other complications of amputation stump: Secondary | ICD-10-CM | POA: Diagnosis not present

## 2019-02-20 DIAGNOSIS — A409 Streptococcal sepsis, unspecified: Secondary | ICD-10-CM | POA: Diagnosis not present

## 2019-02-20 DIAGNOSIS — T8144XA Sepsis following a procedure, initial encounter: Secondary | ICD-10-CM | POA: Diagnosis not present

## 2019-02-20 NOTE — Patient Outreach (Signed)
[facility]) Care Management  02/20/2019  Sheila Austin February 17, 1986 [id number]   Transition of care telephone call  Referral received: 02/02/19 Initial outreach: 02/20/19 Insurance: [place]  Initial unsuccessful telephone call to patient's preferred (home/mobile) number in order to complete transition of care assessment; no answer, left HIPAA compliant voicemail message requesting return call.   Objective: Per the electronic medical record, Sheila Austin was hospitalized at [facility] from 9/26-10/5 for infected foot ulcer with osteomyelitis. She underwent right below the knee amputation on 9/30. She was transferred to acute rehab on 10/5 and discharge to home on 10/16 with [facility] providing the following home health assistance: PT,OT,RN, DME: [facility]-WHEELCHAIR, WIDE [place]. Per social work note pf 10/15 insurance would not cover a bedside commode so patient will use her mother- in- law's. Sheila Austin will receive 2 gm IV ceftriaxone daily until 10/28 with the help of HHRN.  Comorbidities include: Type 2 DM, HTN and morbid obesity.  Plan: This RNCM will route unsuccessful outreach letter with [place] Management pamphlet and 24 hour Nurse Advice Line Magnet to [place] Management clinical pool to be mailed to patient's home address. This RNCM will attempt another outreach within 4 business days.  Barrington Ellison RN,CCM,CDE [place] Management Coordinator Office Phone [phone] Office Fax [phone]

## 2019-02-21 DIAGNOSIS — N184 Chronic kidney disease, stage 4 (severe): Secondary | ICD-10-CM | POA: Diagnosis not present

## 2019-02-21 DIAGNOSIS — T8789 Other complications of amputation stump: Secondary | ICD-10-CM | POA: Diagnosis not present

## 2019-02-21 DIAGNOSIS — T8743 Infection of amputation stump, right lower extremity: Secondary | ICD-10-CM | POA: Diagnosis not present

## 2019-02-21 DIAGNOSIS — I129 Hypertensive chronic kidney disease with stage 1 through stage 4 chronic kidney disease, or unspecified chronic kidney disease: Secondary | ICD-10-CM | POA: Diagnosis not present

## 2019-02-21 DIAGNOSIS — A409 Streptococcal sepsis, unspecified: Secondary | ICD-10-CM | POA: Diagnosis not present

## 2019-02-21 DIAGNOSIS — E1169 Type 2 diabetes mellitus with other specified complication: Secondary | ICD-10-CM | POA: Diagnosis not present

## 2019-02-21 DIAGNOSIS — M869 Osteomyelitis, unspecified: Secondary | ICD-10-CM | POA: Diagnosis not present

## 2019-02-21 DIAGNOSIS — T8144XA Sepsis following a procedure, initial encounter: Secondary | ICD-10-CM | POA: Diagnosis not present

## 2019-02-21 DIAGNOSIS — E1122 Type 2 diabetes mellitus with diabetic chronic kidney disease: Secondary | ICD-10-CM | POA: Diagnosis not present

## 2019-02-23 ENCOUNTER — Telehealth: Payer: Self-pay

## 2019-02-23 ENCOUNTER — Other Ambulatory Visit (HOSPITAL_COMMUNITY)
Admission: RE | Admit: 2019-02-23 | Discharge: 2019-02-23 | Disposition: A | Discharge status: Home / Self Care | Payer: 59 | Source: Ambulatory Visit | Attending: Family Medicine | Admitting: Family Medicine

## 2019-02-23 DIAGNOSIS — Z89511 Acquired absence of right leg below knee: Secondary | ICD-10-CM | POA: Diagnosis not present

## 2019-02-23 DIAGNOSIS — T8789 Other complications of amputation stump: Secondary | ICD-10-CM | POA: Diagnosis not present

## 2019-02-23 DIAGNOSIS — E1122 Type 2 diabetes mellitus with diabetic chronic kidney disease: Secondary | ICD-10-CM | POA: Diagnosis not present

## 2019-02-23 DIAGNOSIS — T8144XA Sepsis following a procedure, initial encounter: Secondary | ICD-10-CM | POA: Diagnosis not present

## 2019-02-23 DIAGNOSIS — A409 Streptococcal sepsis, unspecified: Secondary | ICD-10-CM | POA: Diagnosis not present

## 2019-02-23 DIAGNOSIS — T8743 Infection of amputation stump, right lower extremity: Secondary | ICD-10-CM | POA: Diagnosis not present

## 2019-02-23 DIAGNOSIS — E1169 Type 2 diabetes mellitus with other specified complication: Secondary | ICD-10-CM | POA: Diagnosis not present

## 2019-02-23 DIAGNOSIS — M869 Osteomyelitis, unspecified: Secondary | ICD-10-CM | POA: Diagnosis not present

## 2019-02-23 DIAGNOSIS — N184 Chronic kidney disease, stage 4 (severe): Secondary | ICD-10-CM | POA: Diagnosis not present

## 2019-02-23 DIAGNOSIS — M86171 Other acute osteomyelitis, right ankle and foot: Secondary | ICD-10-CM | POA: Diagnosis not present

## 2019-02-23 DIAGNOSIS — I129 Hypertensive chronic kidney disease with stage 1 through stage 4 chronic kidney disease, or unspecified chronic kidney disease: Secondary | ICD-10-CM | POA: Diagnosis not present

## 2019-02-23 LAB — CBC WITH DIFFERENTIAL/PLATELET
Abs Immature Granulocytes: 0.02 10*3/uL (ref 0.00–0.07)
Basophils Absolute: 0.1 10*3/uL (ref 0.0–0.1)
Basophils Relative: 1 %
Eosinophils Absolute: 0.3 10*3/uL (ref 0.0–0.5)
Eosinophils Relative: 4 %
HCT: 29 % — ABNORMAL LOW (ref 36.0–46.0)
Hemoglobin: 8.8 g/dL — ABNORMAL LOW (ref 12.0–15.0)
Immature Granulocytes: 0 %
Lymphocytes Relative: 33 %
Lymphs Abs: 2.4 10*3/uL (ref 0.7–4.0)
MCH: 26.7 pg (ref 26.0–34.0)
MCHC: 30.3 g/dL (ref 30.0–36.0)
MCV: 88.1 fL (ref 80.0–100.0)
Monocytes Absolute: 0.9 10*3/uL (ref 0.1–1.0)
Monocytes Relative: 12 %
Neutro Abs: 3.7 10*3/uL (ref 1.7–7.7)
Neutrophils Relative %: 50 %
Platelets: 311 10*3/uL (ref 150–400)
RBC: 3.29 MIL/uL — ABNORMAL LOW (ref 3.87–5.11)
RDW: 15.1 % (ref 11.5–15.5)
WBC: 7.2 10*3/uL (ref 4.0–10.5)
nRBC: 0 % (ref 0.0–0.2)

## 2019-02-23 LAB — SEDIMENTATION RATE: Sed Rate: 111 mm/hr — ABNORMAL HIGH (ref 0–22)

## 2019-02-23 LAB — BASIC METABOLIC PANEL
Anion gap: 8 (ref 5–15)
BUN: 24 mg/dL — ABNORMAL HIGH (ref 6–20)
CO2: 23 mmol/L (ref 22–32)
Calcium: 8.4 mg/dL — ABNORMAL LOW (ref 8.9–10.3)
Chloride: 101 mmol/L (ref 98–111)
Creatinine, Ser: 1.14 mg/dL — ABNORMAL HIGH (ref 0.44–1.00)
GFR calc Af Amer: >60 mL/min (ref >60)
GFR calc non Af Amer: >60 mL/min (ref >60)
Glucose, Bld: 125 mg/dL — ABNORMAL HIGH (ref 70–99)
Potassium: 5.4 mmol/L — ABNORMAL HIGH (ref 3.5–5.1)
Sodium: 132 mmol/L — ABNORMAL LOW (ref 135–145)

## 2019-02-23 NOTE — Telephone Encounter (Signed)
Attempted to call pt twice for Transitional Care Call. No answer, left VM.

## 2019-02-23 NOTE — Telephone Encounter (Signed)
Received call from [facility] with [facility] and confirmed patient was indeed admitted to Home Care services on 02/21/19 and labs were drawn as ordered today.   Eugenia Mcalpine

## 2019-02-25 ENCOUNTER — Telehealth: Payer: Self-pay | Admitting: Registered Nurse

## 2019-02-25 ENCOUNTER — Other Ambulatory Visit: Payer: Self-pay | Admitting: *Deleted

## 2019-02-25 ENCOUNTER — Ambulatory Visit: Payer: Self-pay | Admitting: *Deleted

## 2019-02-25 DIAGNOSIS — N184 Chronic kidney disease, stage 4 (severe): Secondary | ICD-10-CM | POA: Diagnosis not present

## 2019-02-25 DIAGNOSIS — A409 Streptococcal sepsis, unspecified: Secondary | ICD-10-CM | POA: Diagnosis not present

## 2019-02-25 DIAGNOSIS — T8789 Other complications of amputation stump: Secondary | ICD-10-CM | POA: Diagnosis not present

## 2019-02-25 DIAGNOSIS — E1122 Type 2 diabetes mellitus with diabetic chronic kidney disease: Secondary | ICD-10-CM | POA: Diagnosis not present

## 2019-02-25 DIAGNOSIS — T8743 Infection of amputation stump, right lower extremity: Secondary | ICD-10-CM | POA: Diagnosis not present

## 2019-02-25 DIAGNOSIS — I129 Hypertensive chronic kidney disease with stage 1 through stage 4 chronic kidney disease, or unspecified chronic kidney disease: Secondary | ICD-10-CM | POA: Diagnosis not present

## 2019-02-25 DIAGNOSIS — M869 Osteomyelitis, unspecified: Secondary | ICD-10-CM | POA: Diagnosis not present

## 2019-02-25 DIAGNOSIS — T8144XA Sepsis following a procedure, initial encounter: Secondary | ICD-10-CM | POA: Diagnosis not present

## 2019-02-25 DIAGNOSIS — E1169 Type 2 diabetes mellitus with other specified complication: Secondary | ICD-10-CM | POA: Diagnosis not present

## 2019-02-25 LAB — HIGH SENSITIVITY CRP: CRP, High Sensitivity: 18.09 mg/L — ABNORMAL HIGH (ref 0.00–3.00)

## 2019-02-25 NOTE — Patient Outreach (Signed)
[facility]) Care Management  02/25/2019  Sheila Austin 1985/09/02 [id number]  Transition of care telephone call  Referral received: 02/02/19 Initial outreach: 02/20/19 Insurance: [place]  Second attempt to reach patient via preferred number (mobile/home)  unsuccessful.  No answer, left HIPAA compliant voicemail message requesting return call.   Objective: Per the electronic medical record, Sheila Austin was hospitalized at [facility] from 9/26-10/5 for infected foot ulcer with osteomyelitis. She underwent right below the knee amputation on 9/30. She was transferred to acute rehab on 10/5 and discharge to home on 10/16 with [facility] providing the following home health assistance: PT,OT,RN, [id number] HEALTH-WHEELCHAIR, [place]. Per social work note pf 10/15 insurance would not cover a bedside commode so patient will use her mother- in- law's. Sheila Austin will receive 2 gm IV ceftriaxone daily until 10/28 with the help of home health RN.  Comorbidities include: Type 2 DM, HTN and morbid obesity.  Plan: If no return call from patient, this RNCM will attempt a third and final outreach within 4 business days.  Barrington Ellison RN,CCM,CDE [place] Management Coordinator Office Phone [phone] Office Fax [phone]

## 2019-02-25 NOTE — Telephone Encounter (Signed)
Mallory called from [facility] wanting orders for- Home health occupational therapy-once a week for 2 weeks. If any questions call Mallory at [phone]

## 2019-02-26 NOTE — Telephone Encounter (Signed)
Called and gave verbal orders for occupational therapy  Once a week for 2 weeks. Spoke with Mallory and she verbalized understanding.

## 2019-02-27 DIAGNOSIS — A409 Streptococcal sepsis, unspecified: Secondary | ICD-10-CM | POA: Diagnosis not present

## 2019-02-27 DIAGNOSIS — I129 Hypertensive chronic kidney disease with stage 1 through stage 4 chronic kidney disease, or unspecified chronic kidney disease: Secondary | ICD-10-CM | POA: Diagnosis not present

## 2019-02-27 DIAGNOSIS — N184 Chronic kidney disease, stage 4 (severe): Secondary | ICD-10-CM | POA: Diagnosis not present

## 2019-02-27 DIAGNOSIS — T8743 Infection of amputation stump, right lower extremity: Secondary | ICD-10-CM | POA: Diagnosis not present

## 2019-02-27 DIAGNOSIS — E1122 Type 2 diabetes mellitus with diabetic chronic kidney disease: Secondary | ICD-10-CM | POA: Diagnosis not present

## 2019-02-27 DIAGNOSIS — T8144XA Sepsis following a procedure, initial encounter: Secondary | ICD-10-CM | POA: Diagnosis not present

## 2019-02-27 DIAGNOSIS — T8789 Other complications of amputation stump: Secondary | ICD-10-CM | POA: Diagnosis not present

## 2019-02-27 DIAGNOSIS — M869 Osteomyelitis, unspecified: Secondary | ICD-10-CM | POA: Diagnosis not present

## 2019-02-27 DIAGNOSIS — A419 Sepsis, unspecified organism: Secondary | ICD-10-CM | POA: Diagnosis not present

## 2019-02-27 DIAGNOSIS — E1169 Type 2 diabetes mellitus with other specified complication: Secondary | ICD-10-CM | POA: Diagnosis not present

## 2019-02-27 DIAGNOSIS — Z89511 Acquired absence of right leg below knee: Secondary | ICD-10-CM | POA: Diagnosis not present

## 2019-03-02 ENCOUNTER — Ambulatory Visit (INDEPENDENT_AMBULATORY_CARE_PROVIDER_SITE_OTHER): Payer: 59 | Admitting: Infectious Disease

## 2019-03-02 ENCOUNTER — Other Ambulatory Visit: Payer: Self-pay | Admitting: *Deleted

## 2019-03-02 ENCOUNTER — Other Ambulatory Visit: Payer: Self-pay

## 2019-03-02 DIAGNOSIS — S88111A Complete traumatic amputation at level between knee and ankle, right lower leg, initial encounter: Secondary | ICD-10-CM

## 2019-03-02 DIAGNOSIS — E11621 Type 2 diabetes mellitus with foot ulcer: Secondary | ICD-10-CM | POA: Diagnosis not present

## 2019-03-02 DIAGNOSIS — S88111S Complete traumatic amputation at level between knee and ankle, right lower leg, sequela: Secondary | ICD-10-CM

## 2019-03-02 DIAGNOSIS — A498 Other bacterial infections of unspecified site: Secondary | ICD-10-CM | POA: Diagnosis not present

## 2019-03-02 DIAGNOSIS — E1169 Type 2 diabetes mellitus with other specified complication: Secondary | ICD-10-CM | POA: Diagnosis not present

## 2019-03-02 DIAGNOSIS — M869 Osteomyelitis, unspecified: Secondary | ICD-10-CM | POA: Diagnosis not present

## 2019-03-02 DIAGNOSIS — B955 Unspecified streptococcus as the cause of diseases classified elsewhere: Secondary | ICD-10-CM | POA: Diagnosis not present

## 2019-03-02 DIAGNOSIS — L97509 Non-pressure chronic ulcer of other part of unspecified foot with unspecified severity: Secondary | ICD-10-CM

## 2019-03-02 DIAGNOSIS — R7881 Bacteremia: Secondary | ICD-10-CM | POA: Diagnosis not present

## 2019-03-02 MED ORDER — CEFDINIR 300 MG PO CAPS: 300.0000 mg | ORAL_CAPSULE | Freq: Two times a day (BID) | ORAL | 1 refills | Status: DC

## 2019-03-02 MED FILL — CEFDINIR 300 MG CAPSULE: 300 | 30 days supply | Qty: 60 | Fill #0

## 2019-03-02 NOTE — Progress Notes (Signed)
Per verbal order from Dr Tommy Medal, 38 cm Single Lumen Peripherally Inserted Central Catheter removed from right basilic, tip intact. No sutures present. RN confirmed length per chart. Dressing was clean and dry. Petroleum dressing applied. Pt advised no heavy lifting with this arm, leave dressing for 24 hours and call the office or seek emergent care if dressing becomes soaked with blood or swelling or sharp pain presents. Patient verbalized understanding and agreement.  Patient's questions answered to their satisfaction. Patient tolerated procedure well, RN walked patient to check out.  [facility] pharmacy and [facility] pharmacy notiifed. Landis Gandy, RN

## 2019-03-02 NOTE — Patient Outreach (Signed)
[place] [facility]) Care Management  03/02/2019  Sheila Austin 1985/12/25 [id number]  Transition of care call Referral received: 02/02/19 Initial outreach attempt: 02/20/19 Insurance: [place] unsuccessful telephone call to patient's preferred (home/mobile) number in order to complete post hospital discharge transition of care assessment; no answer, left HIPAA compliant message requesting return call.   Objective: Per the electronic medical record,Sheila [facility]lanwas hospitalized at [facility] from[facility]/26-10/5 for infected foot ulcer with osteomyelitis. She underwent right below the knee amputation on 9/30. She was transferred to acute rehab on 10/5 and discharge to home on 10/16 with [facility] providing the following home health assistance:PT,OT,RN, [id number] HEALTH-WHEELCHAIR, [place]. Per social work note pf 10/15 insurance would not cover a bedside commode so patient will use her mother- in- law's. Sheila Austin will receive 2 gm IV ceftriaxone daily until10/28with the help of home health RN. Comorbidities include:Type 2 DM, HTN and morbid obesity. In reviewing medical record today, patient's primary care provider gave order on 10/21 to [facility] for additional occupational therapy once week x 2 weeks.  Plan: If no return call from patient, will close case to [place] Management services in 10 business days after initial post hospital discharge outreach, on 03/05/19.  Barrington Ellison RN,CCM,CDE [place] Management Coordinator Office Phone [phone] Office Fax [phone]

## 2019-03-02 NOTE — Progress Notes (Signed)
Subjective:  Chief complaint: Still some throbbing pain at her amputation site  Patient ID: Sheila Austin, female    DOB: [birth year], 33 y.o.   MRN: [id number]  HPI 33 y.o. 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 She was bacteremic at presentation with streptococcus intermedius.  We treated her with ceftriaxone with plans to complete therapy in the next few days.  She no longer has the vacuum dressing in place but just staples now the wound is closed.  She did have some drainage occasionally from the lateral aspect of the wound.  She is not medical see Dr. Sharol Given yet because office is in the middle of the moving to a new location.  She has some throbbing pain at the amputation site she has been religiously wearing the covering that Dr. Sharol Given has asked her to wear.  Her labs were reviewed from 19 October showed her to have a little bit of hyponatremia with a sodium of 132 potassium 5.4 blood sugar 125 creatinine 1.14 slight anemia with hemoglobin 8.8 sed rate was elevated at 111.  High-sensitivity CRP was done rather than a standard CRP making it difficult to interpret versus CRP is we have done which is 18.09.  This point time I feel comfortable discontinuing her IV antibiotics I would like to place her on oral antibiotic to cover her little bit longer until she can be evaluated by Dr. Sharol Given and we can see her again back in the clinic and redraw some labs in terms of her inflammatory markers.   Past Medical History:  Diagnosis Date  . Diabetes mellitus    uncontrolled, last A1c was 14  . E. coli sepsis ([place])   . Ectopic pregnancy   . Essential hypertension, benign 11/01/2008  . Fatty liver   . Gastroparesis   . GERD (gastroesophageal reflux disease)   . Helicobacter pylori gastritis 09/07/2011  . Migraine   . Morbid obesity with body mass index  (BMI) of 40.0 to 49.9 ([place])   . Pulmonary edema   . Pyelonephritis   . Ureteral obstruction     Past Surgical History:  Procedure Laterality Date  . AMPUTATION Right 02/04/2019   Procedure: RIGHT BELOW KNEE AMPUTATION;  Surgeon: Newt Minion, MD;  Location: [facility];  Service: Orthopedics;  Laterality: Right;  . ESOPHAGOGASTRODUODENOSCOPY  2010  . WOUND DEBRIDEMENT Right 01/03/2019   Procedure: DEBRIDEMENT WOUND RIGHT LATERAL FOOT;  Surgeon: Angelia Mould, MD;  Location: [facility];  Service: Vascular;  Laterality: Right;    Family History  Problem Relation Age of Onset  . Arthritis Mother   . Asthma Mother   . COPD Mother   . Depression Mother   . Diabetes Mother   . Hypertension Mother   . Mental illness Mother   . Kidney disease Mother   . Crohn's disease Mother   . Aneurysm Father 74       brain  . Mental illness Sister   . Arthritis Maternal Aunt   . Asthma Maternal Aunt   . COPD Maternal Aunt   . Depression Maternal Aunt   . Diabetes Maternal Aunt   . Hypertension Maternal Aunt   . Mental illness Maternal Aunt   . Stroke Maternal Aunt   . Heart failure Maternal Aunt   . Heart failure Maternal Grandfather   . Cancer Maternal Grandfather   . Diabetes Maternal Grandfather   .  Heart disease Maternal Grandfather   . Hypertension Maternal Grandfather   . Hyperlipidemia Maternal Grandfather   . Mental illness Brother   . Diabetes Maternal Grandmother   . Heart disease Maternal Grandmother   . Hypertension Maternal Grandmother   . Hyperlipidemia Maternal Grandmother   . Diabetes Paternal Grandmother   . Heart disease Paternal Grandmother   . Diabetes Paternal Grandfather   . Cancer Paternal Grandfather       Social History   Socioeconomic History  . Marital status: Divorced    Spouse name: Roderic Palau  . Number of children: 0  . Years of education: 89  . Highest education level: Not on file  Occupational History  . Occupation: Marketing executive: [employer]  . Financial resource strain: Somewhat hard  . Food insecurity    Worry: Sometimes true    Inability: Sometimes true  . Transportation needs    Medical: No    Non-medical: No  Tobacco Use  . Smoking status: Never Smoker  . Smokeless tobacco: Never Used  Substance and Sexual Activity  . Alcohol use: Not Currently  . Drug use: No  . Sexual activity: Yes    Partners: Male    Birth control/protection: Condom    Comment: Pt is interested in taking BCP  Lifestyle  . Physical activity    Days per week: 0 days    Minutes per session: 0 min  . Stress: To some extent  Relationships  . Social Herbalist on phone: Three times a week    Gets together: Three times a week    Attends religious service: Never    Active member of club or organization: No    Attends meetings of clubs or organizations: Never    Relationship status: Divorced  Other Topics Concern  . Not on file  Social History Narrative   The patient is single, separated from her husband 01/2015.   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 Bee Venom Anaphylaxis, Hives, Shortness Of Breath and Swelling  . Lovenox [Enoxaparin Sodium]     Hyperkalemia?  . Penicillins Itching    Has patient had a PCN reaction causing immediate rash, facial/tongue/throat swelling, SOB or lightheadedness with hypotension: yes Has patient had a PCN reaction causing severe rash involving mucus membranes or skin necrosis: unknown Has patient had a PCN reaction that required hospitalization : yes Has patient had a PCN reaction occurring within the last 10 years: no If all of the above answers are "NO", then may proceed with Cephalosporin use.      Current Outpatient Medications:  .  acetaminophen (TYLENOL) 325 MG tablet, Take 1-2 tablets (325-650 mg total) by mouth every 4 (four) hours as needed  for mild pain., Disp:  , Rfl:  .  alum & mag hydroxide-simeth (MAALOX/MYLANTA) 200-200-20 MG/5ML suspension, Take 30 mLs by mouth every 4 (four) hours as needed for indigestion., Disp: 355 mL, Rfl: 0 .  Amino Acids-Protein Hydrolys (FEEDING SUPPLEMENT, PRO-STAT SUGAR FREE 64,) LIQD, Take 30 mLs by mouth 2 (two) times daily., Disp: , Rfl: 0 .  blood glucose meter kit and supplies, Dispense based on patient and insurance preference. Use up to four times daily as directed. (FOR ICD-10 E10.9, E11.9). (Patient not taking: Reported on 02/01/2019), Disp: 1 each, Rfl: 0 .  cefTRIAXone (ROCEPHIN) IVPB, Inject 2 g into the vein daily for 21 days. Indication:  Deep wound infection/bacteremia Last Day of Therapy:  03/04/2019 Labs - Once weekly:  CBC/D and BMP, Labs - Every other week:  ESR and CRP, Disp: 21 Units, Rfl: 0 .  docusate sodium (COLACE) 100 MG capsule, Take 1 capsule (100 mg total) by mouth 2 (two) times daily., Disp: , Rfl: 0 .  folic acid (FOLVITE) 1 MG tablet, Take 1 tablet (1 mg total) by mouth daily., Disp: 30 tablet, Rfl: 0 .  gabapentin (NEURONTIN) 100 MG capsule, Take 1 capsule (100 mg total) by mouth 2 (two) times daily., Disp: 60 capsule, Rfl: 0 .  Insulin Glargine (LANTUS SOLOSTAR) 100 UNIT/ML Solostar Pen, Inject 25 Units into the skin daily., Disp: 15 mL, Rfl: 0 .  insulin lispro (HUMALOG KWIKPEN) 100 UNIT/ML KwikPen, Inject 0-0.08 mLs (0-8 Units total) into the skin 3 (three) times daily before meals. As directed, Disp: 15 mL, Rfl: 0 .  Insulin Pen Needle 31G X 6 MM MISC, 1 pen by Does not apply route as directed., Disp: 100 each, Rfl: 0 .  lisinopril (ZESTRIL) 2.5 MG tablet, Take 1 tablet (2.5 mg total) by mouth daily., Disp: 30 tablet, Rfl: 0 .  Menthol-Methyl Salicylate (MUSCLE RUB) 10-15 % CREA, Apply 1 application topically QID., Disp: , Rfl:  .  methocarbamol (ROBAXIN) 500 MG tablet, Take 1 tablet (500 mg total) by mouth every 6 (six) hours as needed for muscle spasms., Disp: 60  tablet, Rfl: 0 .  oxyCODONE (OXY IR/ROXICODONE) 5 MG immediate release tablet, Take 1-2 tablets (5-10 mg total) by mouth every 8 (eight) hours as needed for moderate pain (pain score 4-6)., Disp: 21 tablet, Rfl: 0 .  pantoprazole (PROTONIX) 40 MG tablet, Take 1 tablet (40 mg total) by mouth daily., Disp: , Rfl:  .  traMADol (ULTRAM) 50 MG tablet, Take 1 tablet (50 mg total) by mouth every 6 (six) hours as needed for moderate pain., Disp: 28 tablet, Rfl: 0   Review of Systems  Constitutional: Negative for chills and fever.  HENT: Negative for congestion and sore throat.   Eyes: Negative for photophobia.  Respiratory: Negative for cough, shortness of breath and wheezing.   Cardiovascular: Negative for chest pain, palpitations and leg swelling.  Gastrointestinal: Negative for abdominal pain, blood in stool, constipation, diarrhea, nausea and vomiting.  Genitourinary: Negative for dysuria, flank pain and hematuria.  Musculoskeletal: Positive for myalgias. Negative for back pain.  Skin: Positive for wound. Negative for rash.  Neurological: Negative for dizziness, weakness and headaches.  Hematological: Does not bruise/bleed easily.  Psychiatric/Behavioral: Negative for agitation, behavioral problems, confusion, decreased concentration and suicidal ideas. The patient is not hyperactive.        Objective:   Physical Exam Constitutional:      General: She is not in acute distress.    Appearance: She is not diaphoretic.  HENT:     Head: Normocephalic and atraumatic.     Right Ear: External ear normal.     Left Ear: External ear normal.     Nose: Nose normal.     Mouth/Throat:     Pharynx: No oropharyngeal exudate.  Eyes:     General: No scleral icterus.    Conjunctiva/sclera: Conjunctivae normal.     Pupils: Pupils are equal, round, and reactive to light.  Neck:     Musculoskeletal: Normal range of motion and neck supple.  Cardiovascular:     Rate and Rhythm: Normal rate  and regular  rhythm.     Heart sounds: No gallop.   Pulmonary:     Effort: Pulmonary effort is normal. No respiratory distress.     Breath sounds: No wheezing.  Abdominal:     General: There is no distension.     Palpations: Abdomen is soft.  Musculoskeletal: Normal range of motion.        General: No tenderness.  Lymphadenopathy:     Cervical: No cervical adenopathy.  Skin:    General: Skin is warm and dry.     Coloration: Skin is not pale.     Findings: No erythema or rash.  Neurological:     General: No focal deficit present.     Mental Status: She is alert and oriented to person, place, and time.     Coordination: Coordination normal.  Psychiatric:        Mood and Affect: Mood normal.        Behavior: Behavior normal.        Thought Content: Thought content normal.        Judgment: Judgment normal.    Amputation site site March 02, 2019:      PICC line        Assessment & Plan:  32 y.o. 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 She was bacteremic at presentation with streptococcus intermedius  DC PICC line  Change to cefdinir 347m po BID and gave one month supply  She is to see Dr. DSharol Givenand I have scheduled repeat visit w me in 2 weeks time reexamine her and recheck her inflammatory markers.

## 2019-03-02 NOTE — Patient Outreach (Signed)
[facility]) Care Management  03/02/2019  Sheila Austin 19-Feb-1986 [id number]   Opened encounter in error. Please see other note of today.  Barrington Ellison RN,CCM,CDE [place] Management Coordinator Office Phone [phone] Office Fax [phone]

## 2019-03-04 DIAGNOSIS — M869 Osteomyelitis, unspecified: Secondary | ICD-10-CM | POA: Diagnosis not present

## 2019-03-04 DIAGNOSIS — I129 Hypertensive chronic kidney disease with stage 1 through stage 4 chronic kidney disease, or unspecified chronic kidney disease: Secondary | ICD-10-CM | POA: Diagnosis not present

## 2019-03-04 DIAGNOSIS — T8789 Other complications of amputation stump: Secondary | ICD-10-CM | POA: Diagnosis not present

## 2019-03-04 DIAGNOSIS — E1169 Type 2 diabetes mellitus with other specified complication: Secondary | ICD-10-CM | POA: Diagnosis not present

## 2019-03-04 DIAGNOSIS — T8743 Infection of amputation stump, right lower extremity: Secondary | ICD-10-CM | POA: Diagnosis not present

## 2019-03-04 DIAGNOSIS — E1122 Type 2 diabetes mellitus with diabetic chronic kidney disease: Secondary | ICD-10-CM | POA: Diagnosis not present

## 2019-03-04 DIAGNOSIS — N184 Chronic kidney disease, stage 4 (severe): Secondary | ICD-10-CM | POA: Diagnosis not present

## 2019-03-04 DIAGNOSIS — A409 Streptococcal sepsis, unspecified: Secondary | ICD-10-CM | POA: Diagnosis not present

## 2019-03-04 DIAGNOSIS — T8144XA Sepsis following a procedure, initial encounter: Secondary | ICD-10-CM | POA: Diagnosis not present

## 2019-03-05 ENCOUNTER — Encounter: Payer: 59 | Attending: Registered Nurse | Admitting: Registered Nurse

## 2019-03-05 ENCOUNTER — Other Ambulatory Visit: Payer: Self-pay | Admitting: *Deleted

## 2019-03-05 NOTE — Patient Outreach (Signed)
[facility]) Care Management  03/05/2019  Sheila Austin 26-Nov-1985 [id number]  Transition of Care Case Closure- Unsuccessful Outreach Referral received: 02/02/19 Initial outreach attempt: 02/20/19 Insurance: [employer]  Objective: Per the electronic medical record,[facility] for infected foot ulcer with osteomyelitis. She underwent right below the knee amputation on 9/30. She was transferred to acute rehab on 10/5 and discharge to home on 10/16 with [facility] providing the following home health assistance:PT,OT,RN, [id number] [employer], [place]. Per social work note pf 10/15 insurance would not cover a bedside commode so patient will use her mother- in- law's. Sheila Austin will receive 2 gm IV ceftriaxone daily until10/28with the help ofhome healthRN. Comorbidities include:Type 2 DM, HTN and morbid obesity. In reviewing medical record on 10/26, patient's primary care provider gave order on 10/21 to [facility] for additional occupational therapy once week x 2 weeks. Patient also saw infectious disease MD on 10/26 and the PICC (peripheral  inserted central lin) line was discontinued and she was started on Cefdinir 300 mg twice daily with return to office in 2 weeks.  Assessment: Unable to complete post hospital discharge transition of care assessment; no return call from patient after 3 attempts and no response to request to contact this RNCM in unsuccessful outreach letter mailed to patient's home on 10/16 20.   Plan: Case closed to [facility] Management services as it has been 10 business days since initial post hospital discharge outreach attempt.  Barrington Ellison RN,CCM,CDE [place] Management Coordinator Office Phone [phone] Office Fax [phone]

## 2019-03-16 ENCOUNTER — Ambulatory Visit: Payer: 59 | Admitting: Infectious Disease

## 2019-03-19 ENCOUNTER — Other Ambulatory Visit: Payer: Self-pay | Admitting: *Deleted

## 2019-03-19 NOTE — Patient Outreach (Addendum)
[name]) Care Management  03/19/2019  Sheila Austin Jun 19, 1985 [id number]   Monthly telephonic UMR case management review with UMR RNCMs and Dr. Alonza Bogus, [name] Director.  Update provided: unsuccessful transition of care outreach by Kelli Churn [facility], unable to reach patient , following recent hospital stay  9/26-10/5 for infected foot ulcer with osteomyelitis.She underwent a right below the knee amputation on 02/04/19.  She was transferred to acute rehab on 10/5 and discharged home on 10/16 with home health services.     Joylene Draft, RN, [place] Management Coordinator  [phone]- Mobile [phone]- Toll Free Main Office

## 2019-03-20 ENCOUNTER — Telehealth: Payer: Self-pay | Admitting: Orthopedic Surgery

## 2019-03-20 NOTE — Telephone Encounter (Signed)
Called all numbers list to try to contact patient for a follow up visit with Dr Sharol Given. Got recording call can not be completed at this time please hang up and try your call again later. Called patient's employer and was told she could not give me any information. She had the same information we had. I sent patient a message in my chart to call and schedule a followup appointment with Dr Sharol Given

## 2019-03-22 DIAGNOSIS — R1110 Vomiting, unspecified: Secondary | ICD-10-CM | POA: Diagnosis not present

## 2019-03-25 ENCOUNTER — Telehealth: Payer: Self-pay

## 2019-03-25 NOTE — Telephone Encounter (Signed)
I called and lm on vm for work number listed to advise that I am trying to reach the pt and if she would please call back. Pt is s/p a BKA and has never had any follow up in the office and Dr. Sharol Given wants to follow up with her. I will hold this message and continue to try and reach out to her. A my chart message was sent last week.

## 2019-03-25 NOTE — Telephone Encounter (Signed)
-----   Message from Pamella Pert, [place] sent at 03/20/2019  9:00 AM EST ----- Regarding: follow up S/p bka with no follow up in the office since surgery. Will hold message and try to follow up with [facility] to see if they are still seeing the pt.

## 2019-03-31 NOTE — Telephone Encounter (Signed)
Multiple attempts to reach the pt via phone and mychart. We have not been able to reach her. This pt is s/p a BKA last month and has not had any office follow up since her hospital discharge. What would you like for me to do?

## 2019-03-31 NOTE — Telephone Encounter (Signed)
Please send certified letter we need to see her

## 2019-03-31 NOTE — Telephone Encounter (Signed)
Can you help with this? Dr. Sharol Given wants to send a certified letter to this pt to ask her to come in the office for eval following a BKA and she has never had any post surgical follow up. We have called, left messages and send a mychart message. Dr. Sharol Given wants to send a certified letter to make sure that we try every way we can to reach this pt. Her surgery was over a month ago. Let me know what I need to do or who I should direct this to please. Thanks!

## 2019-04-19 ENCOUNTER — Other Ambulatory Visit: Payer: Self-pay

## 2019-04-19 ENCOUNTER — Emergency Department (HOSPITAL_COMMUNITY): Payer: Self-pay

## 2019-04-19 ENCOUNTER — Inpatient Hospital Stay (HOSPITAL_COMMUNITY)
Admission: EM | Admit: 2019-04-19 | Discharge: 2019-04-20 | DRG: 392 | Disposition: A | Discharge status: Home / Self Care | Payer: Self-pay | Attending: Family Medicine | Admitting: Family Medicine

## 2019-04-19 ENCOUNTER — Encounter (HOSPITAL_COMMUNITY): Payer: Self-pay | Admitting: Emergency Medicine

## 2019-04-19 DIAGNOSIS — Z599 Problem related to housing and economic circumstances, unspecified: Secondary | ICD-10-CM

## 2019-04-19 DIAGNOSIS — K219 Gastro-esophageal reflux disease without esophagitis: Secondary | ICD-10-CM | POA: Diagnosis present

## 2019-04-19 DIAGNOSIS — R6883 Chills (without fever): Secondary | ICD-10-CM | POA: Diagnosis present

## 2019-04-19 DIAGNOSIS — Z794 Long term (current) use of insulin: Secondary | ICD-10-CM

## 2019-04-19 DIAGNOSIS — I160 Hypertensive urgency: Secondary | ICD-10-CM | POA: Diagnosis present

## 2019-04-19 DIAGNOSIS — Z79899 Other long term (current) drug therapy: Secondary | ICD-10-CM

## 2019-04-19 DIAGNOSIS — K3184 Gastroparesis: Secondary | ICD-10-CM | POA: Diagnosis present

## 2019-04-19 DIAGNOSIS — Z91030 Bee allergy status: Secondary | ICD-10-CM

## 2019-04-19 DIAGNOSIS — E785 Hyperlipidemia, unspecified: Secondary | ICD-10-CM | POA: Diagnosis present

## 2019-04-19 DIAGNOSIS — F419 Anxiety disorder, unspecified: Secondary | ICD-10-CM | POA: Diagnosis present

## 2019-04-19 DIAGNOSIS — I10 Essential (primary) hypertension: Secondary | ICD-10-CM | POA: Diagnosis present

## 2019-04-19 DIAGNOSIS — Z91128 Patient's intentional underdosing of medication regimen for other reason: Secondary | ICD-10-CM

## 2019-04-19 DIAGNOSIS — Z9114 Patient's other noncompliance with medication regimen: Secondary | ICD-10-CM

## 2019-04-19 DIAGNOSIS — K760 Fatty (change of) liver, not elsewhere classified: Secondary | ICD-10-CM | POA: Diagnosis present

## 2019-04-19 DIAGNOSIS — A419 Sepsis, unspecified organism: Secondary | ICD-10-CM | POA: Diagnosis present

## 2019-04-19 DIAGNOSIS — Z833 Family history of diabetes mellitus: Secondary | ICD-10-CM

## 2019-04-19 DIAGNOSIS — R112 Nausea with vomiting, unspecified: Principal | ICD-10-CM | POA: Diagnosis present

## 2019-04-19 DIAGNOSIS — Z560 Unemployment, unspecified: Secondary | ICD-10-CM

## 2019-04-19 DIAGNOSIS — Z87892 Personal history of anaphylaxis: Secondary | ICD-10-CM

## 2019-04-19 DIAGNOSIS — E871 Hypo-osmolality and hyponatremia: Secondary | ICD-10-CM | POA: Diagnosis present

## 2019-04-19 DIAGNOSIS — Z20828 Contact with and (suspected) exposure to other viral communicable diseases: Secondary | ICD-10-CM | POA: Diagnosis present

## 2019-04-19 DIAGNOSIS — E1165 Type 2 diabetes mellitus with hyperglycemia: Secondary | ICD-10-CM | POA: Diagnosis present

## 2019-04-19 DIAGNOSIS — E1143 Type 2 diabetes mellitus with diabetic autonomic (poly)neuropathy: Secondary | ICD-10-CM | POA: Diagnosis present

## 2019-04-19 DIAGNOSIS — T465X6A Underdosing of other antihypertensive drugs, initial encounter: Secondary | ICD-10-CM | POA: Diagnosis present

## 2019-04-19 DIAGNOSIS — Z6841 Body Mass Index (BMI) 40.0 and over, adult: Secondary | ICD-10-CM

## 2019-04-19 DIAGNOSIS — E119 Type 2 diabetes mellitus without complications: Secondary | ICD-10-CM

## 2019-04-19 DIAGNOSIS — Z888 Allergy status to other drugs, medicaments and biological substances status: Secondary | ICD-10-CM

## 2019-04-19 DIAGNOSIS — Z89511 Acquired absence of right leg below knee: Secondary | ICD-10-CM

## 2019-04-19 DIAGNOSIS — S88111S Complete traumatic amputation at level between knee and ankle, right lower leg, sequela: Secondary | ICD-10-CM

## 2019-04-19 DIAGNOSIS — Z8249 Family history of ischemic heart disease and other diseases of the circulatory system: Secondary | ICD-10-CM

## 2019-04-19 DIAGNOSIS — Z880 Allergy status to penicillin: Secondary | ICD-10-CM

## 2019-04-19 LAB — LACTIC ACID, PLASMA: Lactic Acid, Venous: 1.6 mmol/L (ref 0.5–1.9)

## 2019-04-19 LAB — COMPREHENSIVE METABOLIC PANEL
ALT: 16 U/L (ref 0–44)
AST: 11 U/L — ABNORMAL LOW (ref 15–41)
Albumin: 2.6 g/dL — ABNORMAL LOW (ref 3.5–5.0)
Alkaline Phosphatase: 101 U/L (ref 38–126)
Anion gap: 10 (ref 5–15)
BUN: 24 mg/dL — ABNORMAL HIGH (ref 6–20)
CO2: 26 mmol/L (ref 22–32)
Calcium: 8.6 mg/dL — ABNORMAL LOW (ref 8.9–10.3)
Chloride: 96 mmol/L — ABNORMAL LOW (ref 98–111)
Creatinine, Ser: 1.04 mg/dL — ABNORMAL HIGH (ref 0.44–1.00)
GFR calc Af Amer: >60 mL/min (ref >60)
GFR calc non Af Amer: >60 mL/min (ref >60)
Glucose, Bld: 424 mg/dL — ABNORMAL HIGH (ref 70–99)
Potassium: 4.6 mmol/L (ref 3.5–5.1)
Sodium: 132 mmol/L — ABNORMAL LOW (ref 135–145)
Total Bilirubin: 0.8 mg/dL (ref 0.3–1.2)
Total Protein: 7.3 g/dL (ref 6.5–8.1)

## 2019-04-19 LAB — CBC WITH DIFFERENTIAL/PLATELET
Abs Immature Granulocytes: 0.07 10*3/uL (ref 0.00–0.07)
Basophils Absolute: 0.1 10*3/uL (ref 0.0–0.1)
Basophils Relative: 1 %
Eosinophils Absolute: 0.4 10*3/uL (ref 0.0–0.5)
Eosinophils Relative: 3 %
HCT: 37.7 % (ref 36.0–46.0)
Hemoglobin: 12.6 g/dL (ref 12.0–15.0)
Immature Granulocytes: 1 %
Lymphocytes Relative: 14 %
Lymphs Abs: 1.9 10*3/uL (ref 0.7–4.0)
MCH: 27.8 pg (ref 26.0–34.0)
MCHC: 33.4 g/dL (ref 30.0–36.0)
MCV: 83 fL (ref 80.0–100.0)
Monocytes Absolute: 0.7 10*3/uL (ref 0.1–1.0)
Monocytes Relative: 5 %
Neutro Abs: 10 10*3/uL — ABNORMAL HIGH (ref 1.7–7.7)
Neutrophils Relative %: 76 %
Platelets: 410 10*3/uL — ABNORMAL HIGH (ref 150–400)
RBC: 4.54 MIL/uL (ref 3.87–5.11)
RDW: 14.9 % (ref 11.5–15.5)
WBC: 13.1 10*3/uL — ABNORMAL HIGH (ref 4.0–10.5)
nRBC: 0 % (ref 0.0–0.2)

## 2019-04-19 LAB — PROTIME-INR
INR: 1 (ref 0.8–1.2)
Prothrombin Time: 13 seconds (ref 11.4–15.2)

## 2019-04-19 LAB — CBG MONITORING, ED
Glucose-Capillary: 403 mg/dL — ABNORMAL HIGH (ref 70–99)
Glucose-Capillary: 262 mg/dL — ABNORMAL HIGH (ref 70–99)
Glucose-Capillary: 367 mg/dL — ABNORMAL HIGH (ref 70–99)

## 2019-04-19 LAB — I-STAT BETA HCG BLOOD, ED (MC, WL, AP ONLY): I-stat hCG, quantitative: <5 m[IU]/mL (ref <5)

## 2019-04-19 LAB — POC SARS CORONAVIRUS 2 AG -  ED: SARS Coronavirus 2 Ag: NEGATIVE

## 2019-04-19 LAB — APTT: aPTT: 24 seconds (ref 24–36)

## 2019-04-19 MED ORDER — SODIUM CHLORIDE 0.9 % IV SOLN
2.0000 g | Freq: Once | INTRAVENOUS | Status: AC
  Administered 2019-04-19: 2 g via INTRAVENOUS
  Filled 2019-04-19: qty 20

## 2019-04-19 MED ORDER — ACETAMINOPHEN 325 MG PO TABS: 650.0000 mg | ORAL_TABLET | Freq: Four times a day (QID) | ORAL | Status: DC | PRN

## 2019-04-19 MED ORDER — VANCOMYCIN HCL 10 G IV SOLR
2500.0000 mg | Freq: Once | INTRAVENOUS | Status: AC
  Administered 2019-04-19 (×2): 2500 mg via INTRAVENOUS
  Filled 2019-04-19: qty 2500

## 2019-04-19 MED ORDER — ONDANSETRON HCL 4 MG/2ML IJ SOLN
4.0000 mg | Freq: Four times a day (QID) | INTRAMUSCULAR | Status: DC | PRN
  Administered 2019-04-19: 4 mg via INTRAVENOUS
  Filled 2019-04-19: qty 2

## 2019-04-19 MED ORDER — SODIUM CHLORIDE 0.9 % IV SOLN
2.0000 g | Freq: Three times a day (TID) | INTRAVENOUS | Status: DC
  Administered 2019-04-20 (×2): 2 g via INTRAVENOUS
  Filled 2019-04-19 (×4): qty 2

## 2019-04-19 MED ORDER — SODIUM CHLORIDE 0.9% FLUSH: 3.0000 mL | Freq: Once | INTRAVENOUS | Status: DC

## 2019-04-19 MED ORDER — LABETALOL HCL 5 MG/ML IV SOLN
10.0000 mg | Freq: Once | INTRAVENOUS | Status: AC
  Administered 2019-04-19: 10 mg via INTRAVENOUS
  Filled 2019-04-19: qty 4

## 2019-04-19 MED ORDER — HYDROXYZINE HCL 25 MG PO TABS: 25.0000 mg | ORAL_TABLET | Freq: Four times a day (QID) | ORAL | Status: DC | PRN

## 2019-04-19 MED ORDER — METHOCARBAMOL 500 MG PO TABS: 500.0000 mg | ORAL_TABLET | Freq: Four times a day (QID) | ORAL | Status: DC | PRN

## 2019-04-19 MED ORDER — GABAPENTIN 100 MG PO CAPS
100.0000 mg | ORAL_CAPSULE | Freq: Two times a day (BID) | ORAL | Status: DC
  Administered 2019-04-19 – 2019-04-20 (×2): 100 mg via ORAL
  Filled 2019-04-19 (×2): qty 1

## 2019-04-19 MED ORDER — LISINOPRIL 2.5 MG PO TABS
2.5000 mg | ORAL_TABLET | Freq: Every day | ORAL | Status: DC
  Administered 2019-04-19 – 2019-04-20 (×2): 2.5 mg via ORAL
  Filled 2019-04-19 (×2): qty 1

## 2019-04-19 MED ORDER — SODIUM CHLORIDE 0.9 % IV BOLUS (SEPSIS)
1000.0000 mL | Freq: Once | INTRAVENOUS | Status: AC
  Administered 2019-04-19: 1000 mL via INTRAVENOUS

## 2019-04-19 MED ORDER — VANCOMYCIN HCL 10 G IV SOLR
1750.0000 mg | INTRAVENOUS | Status: DC
  Filled 2019-04-19: qty 1750

## 2019-04-19 MED ORDER — ONDANSETRON HCL 4 MG/2ML IJ SOLN
4.0000 mg | Freq: Once | INTRAMUSCULAR | Status: AC
  Administered 2019-04-19: 4 mg via INTRAVENOUS
  Filled 2019-04-19: qty 2

## 2019-04-19 MED ORDER — INSULIN ASPART 100 UNIT/ML ~~LOC~~ SOLN
0.0000 [IU] | Freq: Three times a day (TID) | SUBCUTANEOUS | Status: DC
  Administered 2019-04-20: 8 [IU] via SUBCUTANEOUS
  Administered 2019-04-20: 5 [IU] via SUBCUTANEOUS

## 2019-04-19 MED ORDER — VANCOMYCIN HCL IN DEXTROSE 1-5 GM/200ML-% IV SOLN: 1000.0000 mg | Freq: Once | INTRAVENOUS | Status: DC

## 2019-04-19 MED ORDER — ONDANSETRON HCL 4 MG PO TABS: 4.0000 mg | ORAL_TABLET | Freq: Four times a day (QID) | ORAL | Status: DC | PRN

## 2019-04-19 MED ORDER — ACETAMINOPHEN 650 MG RE SUPP: 650.0000 mg | Freq: Four times a day (QID) | RECTAL | Status: DC | PRN

## 2019-04-19 MED ORDER — PANTOPRAZOLE SODIUM 40 MG PO TBEC
40.0000 mg | DELAYED_RELEASE_TABLET | Freq: Every day | ORAL | Status: DC
  Administered 2019-04-19 – 2019-04-20 (×2): 40 mg via ORAL
  Filled 2019-04-19 (×2): qty 1

## 2019-04-19 MED ORDER — LABETALOL HCL 5 MG/ML IV SOLN: 10.0000 mg | Freq: Four times a day (QID) | INTRAVENOUS | Status: DC | PRN

## 2019-04-19 MED ORDER — SODIUM CHLORIDE 0.9 % IV SOLN
INTRAVENOUS | Status: DC
  Administered 2019-04-19: 21:00:00 via INTRAVENOUS

## 2019-04-19 MED ORDER — INSULIN ASPART 100 UNIT/ML ~~LOC~~ SOLN
0.0000 [IU] | Freq: Every day | SUBCUTANEOUS | Status: DC
  Administered 2019-04-19: 3 [IU] via SUBCUTANEOUS

## 2019-04-19 MED ORDER — INSULIN ASPART 100 UNIT/ML ~~LOC~~ SOLN
8.0000 [IU] | Freq: Once | SUBCUTANEOUS | Status: AC
  Administered 2019-04-19: 8 [IU] via INTRAVENOUS

## 2019-04-19 NOTE — ED Notes (Signed)
Pt transported to xray 

## 2019-04-19 NOTE — Progress Notes (Signed)
Notified bedside nurse of need to  draw blood cultures and give antibiotics.  

## 2019-04-19 NOTE — ED Notes (Signed)
Cannot get the second blood culture drawn  Rocephin started

## 2019-04-19 NOTE — ED Provider Notes (Signed)
  Physical Exam  BP (!) 201/112   Pulse (!) 110   Temp 98.4 F (36.9 C) (Oral)   Resp (!) 21   Ht 5\' 2"  (1.575 m)   Wt 117.9 kg   LMP 04/15/2019   SpO2 100%   BMI 47.55 kg/m   Physical Exam  ED Course/Procedures     Procedures  MDM   Pt signed out to me by A Law, PA-C. Please see previous notes for further history.   In brief, pt presenting for evaluation of sepsis. She has had several days of chills, diaphoresis, and vomiting dark emesis.  Patient states he recently had a BKA in September with Dr. 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  Unfortunately she was lost to follow-up after she is being discharged from rehab end of October.  Patient states her leg started to look infected so she took her staples out few days ago.  She denies significant pain at the area.  She arrived tachycardic.  Was found to have a leukocytosis, although her lactic was normal.  As she has a likely source of infection, code sepsis was called.  Of note, patient is also hyperglycemic around 450.    Delay in antibiotics and fluid given due to difficulty with access.  Chest x-ray viewed interpreted by me, no pneumonia comparison effusion, cardiomegaly.  Knee x-ray without obvious osteo.  Rapid Covid negative.  Patient without cough or shortness of breath, low suspicion for Covid at this time, however longer PCR test was sent.  Will call orthopedics for further recommendations.  Discussed with Dr. Ninfa Linden from [facility] who recommends admission to medicine, Dr. Sharol Given will consult while pt is admitted.   Sepsis - Repeat Assessment  Performed at:    5:15 PM   Vitals     Blood pressure (!) 184/112, pulse (!) 107, temperature 98.4 F (36.9 C), temperature source Oral, resp. rate 20, height 5\' 2"  (1.575 m), weight 117.9 kg, last menstrual period 04/15/2019, SpO2 100 %.  Heart:     Tachycardic  Lungs:    CTA  Capillary Refill:   <2 sec  Peripheral  Pulse:   Radial pulse palpable  Skin:     Normal Color   Discussed with Dr. Doristine Bosworth from [facility], pt to be admitted.         Franchot Heidelberg, PA-C 04/19/19 1715    Tegeler, Gwenyth Allegra, MD 04/20/19 [phone]

## 2019-04-19 NOTE — Progress Notes (Signed)
Pt received from ED via bed. Ao x4. Breathing even and unlabored in RA. Vitals stable, denies any pain. CHG bath completed. Connected to [place] and [place] notified. Oriented pt to room and call bell system. Pt's belonging by bedside. Has her wheelchair. Call bell within reach. Will continue to monitor.

## 2019-04-19 NOTE — ED Notes (Signed)
Pt just returned from somewhere she needs  Lab person to draw blood cultures  She is a hard stick  va n on pump ready to go

## 2019-04-19 NOTE — ED Notes (Signed)
Dr Ninfa Linden at  The bedside

## 2019-04-19 NOTE — ED Notes (Signed)
Nauseated with head movement

## 2019-04-19 NOTE — Progress Notes (Signed)
Patient ID: Sheila Austin, female   DOB: 1985-07-11, 33 y.o.   MRN: [id number] I did come by the bedside in the ER to see the patient.  She is 9 weeks out from a right below-knee amputation by my partner Dr. Sharol Given.  She is being admitted for signs and symptoms consistent with sepsis.  Her blood glucose is significantly high.  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  She states that the staples only came out just recently from her right BKA stump.  She states that the stone does not hurt at all.  On examination of the amputation stump there is some slight eschar and some areas along the incision line.  The incision is closed and there is no erythema.  I could not express any fluid from the incision and there is no areas of fluctuance.  Some of the pink tissue is consistent with irritation from having the staples in for so long.  Since she is being admitted to the medicine service, I will have Dr. Sharol Given come by tomorrow morning to take a look at her right BKA stump for further assessment.

## 2019-04-19 NOTE — ED Provider Notes (Signed)
[facility]ERGENCY DEPARTMENT Provider Note   CSN: [id number] Arrival date & time: 04/19/19  1241     History Chief Complaint  Patient presents with  . Emesis  . Chills  . Wound Infection    Sheila Austin is a 33 y.o. 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  Patient reports she lost her insurance due to losing her job and thus has not been able to follow-up.  She removed staples on her own a few days ago from her incision because it started to look infected.  Yesterday, she began with chills, sweats, and dark emesis.  She has had some redness and drainage from the incision.  Patient denies any significant pain.  She denies any chest pain, shortness of breath, cough, abdominal pain, diarrhea, urinary symptoms.  No interventions taken prior to arrival.  HPI     Past Medical History:  Diagnosis Date  . Diabetes mellitus    uncontrolled, last A1c was 14  . E. coli sepsis ([place])   . Ectopic pregnancy   . Essential hypertension, benign 11/01/2008  . Fatty liver   . Gastroparesis   . GERD (gastroesophageal reflux disease)   . Helicobacter pylori gastritis 09/07/2011  . Migraine   . Morbid obesity with body mass index (BMI) of 40.0 to 49.9 ([place])   . Pulmonary edema   . Pyelonephritis   . Ureteral obstruction     Patient Active Problem List   Diagnosis Date Noted  . Benign essential HTN   . Unilateral complete BKA, right, sequela ([place])   . Unilateral complete BKA, right, initial encounter ([place]) 02/09/2019  . Acquired absence of right leg below knee ([place])   . Hyponatremia   . Acute blood loss anemia   . Sinus tachycardia   . Post-operative pain   . Bacteremia   . Sepsis with acute organ dysfunction without septic shock ([place])   . 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 Streptococcal bacteremia   . Klebsiella pneumoniae infection   .  Abscess of leg, right   . Skin ulcer of right foot with necrosis of muscle ([place])   . Severe protein-calorie malnutrition ([place])   . Diabetic foot ulcer with osteomyelitis ([place]) 02/01/2019  . Adjustment disorder 02/01/2019  . Obesity 12/31/2018  . Facial cellulitis 07/02/2018  . Class 3 severe obesity with body mass index (BMI) of 40.0 to 44.9 in adult ([place]) 07/09/2017  . Abscess of right breast 02/24/2016  . BMI 45.0-49.9, adult ([place]) 09/29/2015  . Skin lesions - right lower extremity 12/07/2013  . Morbid obesity with BMI of 50.0-59.9, adult ([place]) 10/05/2011  . Obstructive sleep apnea 10/05/2011  . Hypertriglyceridemia 09/27/2011  . Migraine 09/07/2011  . Essential hypertension, benign 11/01/2008  . Gastroparesis 09/27/2008  . IRREGULAR MENSES 08/24/2008  . Diabetes mellitus out of control ([place]) 07/04/2006    Past Surgical History:  Procedure Laterality Date  . AMPUTATION Right 02/04/2019   Procedure: RIGHT BELOW KNEE AMPUTATION;  Surgeon: Newt Minion, MD;  Location: [facility];  Service: Orthopedics;  Laterality: Right;  . ESOPHAGOGASTRODUODENOSCOPY  2010  . WOUND DEBRIDEMENT Right 01/03/2019   Procedure: DEBRIDEMENT WOUND RIGHT LATERAL FOOT;  Surgeon: Angelia Mould, MD;  Location: [facility];  Service: Vascular;  Laterality: Right;     OB History    Gravida  1   Para      Term  Preterm      AB  1   Living  0     SAB      TAB      Ectopic  1   Multiple      Live Births              Family History  Problem Relation Age of Onset  . Arthritis Mother   . Asthma Mother   . COPD Mother   . Depression Mother   . Diabetes Mother   . Hypertension Mother   . Mental illness Mother   . Kidney disease Mother   . Crohn's disease Mother   . Aneurysm Father 51       brain  . Mental illness Sister   . Arthritis Maternal Aunt   . Asthma Maternal Aunt   . COPD Maternal Aunt   . Depression Maternal Aunt   . Diabetes Maternal Aunt   . Hypertension Maternal  Aunt   . Mental illness Maternal Aunt   . Stroke Maternal Aunt   . Heart failure Maternal Aunt   . Heart failure Maternal Grandfather   . Cancer Maternal Grandfather   . Diabetes Maternal Grandfather   . Heart disease Maternal Grandfather   . Hypertension Maternal Grandfather   . Hyperlipidemia Maternal Grandfather   . Mental illness Brother   . Diabetes Maternal Grandmother   . Heart disease Maternal Grandmother   . Hypertension Maternal Grandmother   . Hyperlipidemia Maternal Grandmother   . Diabetes Paternal Grandmother   . Heart disease Paternal Grandmother   . Diabetes Paternal Grandfather   . Cancer Paternal Grandfather     Social History   Tobacco Use  . Smoking status: Never Smoker  . Smokeless tobacco: Never Used  Substance Use Topics  . Alcohol use: Not Currently  . Drug use: No    Home Medications Prior to Admission medications   Medication Sig Start Date End Date Taking? Authorizing Provider  acetaminophen (TYLENOL) 325 MG tablet Take 1-2 tablets (325-650 mg total) by mouth every 4 (four) hours as needed for mild pain. 02/13/19   Love, Ivan Anchors, PA-C  alum & mag hydroxide-simeth (MAALOX/MYLANTA) 200-200-20 MG/5ML suspension Take 30 mLs by mouth every 4 (four) hours as needed for indigestion. 02/13/19   Love, Ivan Anchors, PA-C  Amino Acids-Protein Hydrolys (FEEDING SUPPLEMENT, PRO-STAT SUGAR FREE 64,) LIQD Take 30 mLs by mouth 2 (two) times daily. 02/06/19   Pokhrel, Corrie Mckusick, MD  blood glucose meter kit and supplies Dispense based on patient and insurance preference. Use up to four times daily as directed. (FOR ICD-10 E10.9, E11.9). Patient not taking: Reported on 02/01/2019 07/08/18   Murlean Iba, MD  cefdinir (OMNICEF) 300 MG capsule Take 1 capsule (300 mg total) by mouth 2 (two) times daily. 03/02/19   Truman Hayward, MD  ciprofloxacin (CIPRO) 500 MG tablet  01/28/19   [provider]  docusate sodium (COLACE) 100 MG capsule Take 1 capsule (100 mg  total) by mouth 2 (two) times daily. 02/06/19   Pokhrel, Corrie Mckusick, MD  folic acid (FOLVITE) 1 MG tablet Take 1 tablet (1 mg total) by mouth daily. 02/20/19   Love, Ivan Anchors, PA-C  gabapentin (NEURONTIN) 100 MG capsule Take 1 capsule (100 mg total) by mouth 2 (two) times daily. 02/19/19   Love, Ivan Anchors, PA-C  Insulin Glargine (LANTUS SOLOSTAR) 100 UNIT/ML Solostar Pen Inject 25 Units into the skin daily. 02/19/19   Love, Ivan Anchors, PA-C  insulin lispro (  HUMALOG KWIKPEN) 100 UNIT/ML KwikPen Inject 0-0.08 mLs (0-8 Units total) into the skin 3 (three) times daily before meals. As directed 02/19/19   Love, Ivan Anchors, PA-C  Insulin Pen Needle 31G X 6 MM MISC 1 pen by Does not apply route as directed. 02/19/19   Love, Ivan Anchors, PA-C  lisinopril (ZESTRIL) 2.5 MG tablet Take 1 tablet (2.5 mg total) by mouth daily. 02/20/19   Love, Ivan Anchors, PA-C  Menthol-Methyl Salicylate (MUSCLE RUB) 10-15 % CREA Apply 1 application topically QID.    [provider]  methocarbamol (ROBAXIN) 500 MG tablet Take 1 tablet (500 mg total) by mouth every 6 (six) hours as needed for muscle spasms. 02/19/19   Love, Ivan Anchors, PA-C  oxyCODONE (OXY IR/ROXICODONE) 5 MG immediate release tablet Take 1-2 tablets (5-10 mg total) by mouth every 8 (eight) hours as needed for moderate pain (pain score 4-6). 02/19/19   Love, Ivan Anchors, PA-C  pantoprazole (PROTONIX) 40 MG tablet Take 1 tablet (40 mg total) by mouth daily. 02/07/19   Pokhrel, Corrie Mckusick, MD  traMADol (ULTRAM) 50 MG tablet Take 1 tablet (50 mg total) by mouth every 6 (six) hours as needed for moderate pain. 02/19/19   Love, Ivan Anchors, PA-C    Allergies    Bee venom, Lovenox [enoxaparin sodium], and Penicillins  Review of Systems   Review of Systems  Constitutional: Positive for chills. Negative for fever.  HENT: Negative for facial swelling and sore throat.   Respiratory: Negative for shortness of breath.   Cardiovascular: Negative for chest pain.  Gastrointestinal: Positive  for nausea. Negative for abdominal pain and vomiting.  Genitourinary: Negative for dysuria.  Musculoskeletal: Negative for back pain.  Skin: Positive for wound. Negative for rash.  Neurological: Negative for headaches.  Psychiatric/Behavioral: The patient is not nervous/anxious.     Physical Exam Updated Vital Signs BP (!) 201/112   Pulse (!) 110   Temp 98.4 F (36.9 C) (Oral)   Resp (!) 21   Ht 5' 2"  (1.575 m)   Wt 117.9 kg   LMP 04/15/2019   SpO2 100%   BMI 47.55 kg/m   Physical Exam Vitals and nursing note reviewed.  Constitutional:      General: She is not in acute distress.    Appearance: She is well-developed. She is not diaphoretic.  HENT:     Head: Normocephalic and atraumatic.     Mouth/Throat:     Pharynx: No oropharyngeal exudate.  Eyes:     General: No scleral icterus.       Right eye: No discharge.        Left eye: No discharge.     Conjunctiva/sclera: Conjunctivae normal.     Pupils: Pupils are equal, round, and reactive to light.  Neck:     Thyroid: No thyromegaly.  Cardiovascular:     Rate and Rhythm: Regular rhythm. Tachycardia present.     Heart sounds: Normal heart sounds. No murmur. No friction rub. No gallop.   Pulmonary:     Effort: Pulmonary effort is normal. No respiratory distress.     Breath sounds: Normal breath sounds. No stridor. No wheezing or rales.  Abdominal:     General: Bowel sounds are normal. There is no distension.     Palpations: Abdomen is soft.     Tenderness: There is no abdominal tenderness. There is no guarding or rebound.  Musculoskeletal:     Cervical back: Normal range of motion and neck supple.  Lymphadenopathy:  Cervical: No cervical adenopathy.  Skin:    General: Skin is warm and dry.     Coloration: Skin is not pale.     Findings: No rash.     Comments: Right BKA with some purulent drainage, mild erythema, no tenderness, see photo  Neurological:     Mental Status: She is alert.     Coordination:  Coordination normal.         ED Results / Procedures / Treatments   Labs (all labs ordered are listed, but only abnormal results are displayed) Labs Reviewed  COMPREHENSIVE METABOLIC PANEL - Abnormal; Notable for the following components:      Result Value   Sodium 132 (*)    Chloride 96 (*)    Glucose, Bld 424 (*)    BUN 24 (*)    Creatinine, Ser 1.04 (*)    Calcium 8.6 (*)    Albumin 2.6 (*)    AST 11 (*)    All other components within normal limits  CBC WITH DIFFERENTIAL/PLATELET - Abnormal; Notable for the following components:   WBC 13.1 (*)    Platelets 410 (*)    Neutro Abs 10.0 (*)    All other components within normal limits  CULTURE, BLOOD (ROUTINE X 2)  CULTURE, BLOOD (ROUTINE X 2)  URINE CULTURE  SARS CORONAVIRUS 2 (TAT 6-24 HRS)  LACTIC ACID, PLASMA  URINALYSIS, ROUTINE W REFLEX MICROSCOPIC  APTT  PROTIME-INR  I-STAT BETA HCG BLOOD, ED ([facility], [facility], [facility] ONLY)  POC SARS CORONAVIRUS 2 AG -  ED  POC OCCULT BLOOD, ED    EKG None  Radiology No results found.  Procedures .Critical Care Performed by: Frederica Kuster, PA-C Authorized by: Frederica Kuster, PA-C   Critical care provider statement:    Critical care time (minutes):  45   Critical care was necessary to treat or prevent imminent or life-threatening deterioration of the following conditions:  Sepsis   Critical care was time spent personally by me on the following activities:  Discussions with consultants, evaluation of patient's response to treatment, examination of patient, ordering and performing treatments and interventions, ordering and review of laboratory studies, ordering and review of radiographic studies, pulse oximetry, re-evaluation of patient's condition, obtaining history from patient or surrogate and review of old charts   I assumed direction of critical care for this patient from another provider in my specialty: no     (including critical care time)  Medications Ordered in  ED Medications  sodium chloride flush (NS) 0.9 % injection 3 mL (has no administration in time range)  sodium chloride 0.9 % bolus 1,000 mL (has no administration in time range)  cefTRIAXone (ROCEPHIN) 2 g in sodium chloride 0.9 % 100 mL IVPB (has no administration in time range)  ondansetron (ZOFRAN) injection 4 mg (has no administration in time range)  insulin aspart (novoLOG) injection 8 Units (has no administration in time range)  vancomycin (VANCOCIN) 2,500 mg in sodium chloride 0.9 % 500 mL IVPB (has no administration in time range)    Followed by  vancomycin (VANCOCIN) 1,750 mg in sodium chloride 0.9 % 500 mL IVPB (has no administration in time range)  labetalol (NORMODYNE) injection 10 mg (has no administration in time range)    ED Course  I have reviewed the triage vital signs and the nursing notes.  Pertinent labs & imaging results that were available during my care of the patient were reviewed by me and considered in my medical decision making (see  chart for details).    MDM Rules/Calculators/A&P       Patient presenting with nausea, vomiting, chills, diaphoresis, redness and drainage to incision site at BKA.  She denies any documented fevers, cough, shortness of breath, abdominal pain, urinary symptoms.  Code sepsis called considering tachycardia, leukocytosis.  Initial lactic is negative.  Blood culture sent.  Vancomycin and Rocephin initiated.  Chest x-ray and right knee x-ray pending.  Hemoccult of emesis pending as well.  Anticipate admission and consult with orthopedics after knee x-ray to make them aware of patient as they have been actively trying to reach the patient.  She states she lost her phone and car for a while as well as her health insurance. At shift change, patient care transferred to [facility], PA-C for continued evaluation, follow up of x-rays, and determination of disposition. Anticipate admission.  Final Clinical Impression(s) / ED Diagnoses Final  diagnoses:  Sepsis [facility])    Rx / [place] Orders ED Discharge Orders    None       Frederica Kuster, PA-C 04/19/19 1527    Isla Pence, MD 04/19/19 1623

## 2019-04-19 NOTE — H&P (Signed)
History and Physical    Sheila Austin YQM:[id number] DOB: 25-Mar-1986 DOA: 04/19/2019  PCP: Maximiano Coss, NP  Patient coming from: Home  I have personally briefly reviewed patient's old medical records in [place]  Chief Complaint: Vomiting, chills and diaphoresis.  HPI: Sheila Austin is a 33 y.o. 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 Sharol Given presents to emergency department due to vomiting, chills and erythema at stem site.  Patient tells me that since her discharge from the hospital after right BKA she lost her insurance and job and due to financial issues she was not able to follow-up with orthopedic surgery.  Last night she had constant vomiting, unable to keep anything down, too many episodes to count, dark brown in color, nonbilious, nonbloody.  Has chills and diaphoresis.  She took out staples by herself couple of days ago.  No history of fever, pain, bleeding, discharge from the stump site, blurry vision, lightheadedness, dizziness, chest pain, shortness of breath, palpitation, diarrhea, decreased appetite, generalized weakness or lethargy.  She tells me that she has headache due to stress and anxiety.  She lives with her fianc and denies smoking, alcohol, illicit drug use.  She has not been taking her blood pressure medication and insulin since her discharge from the hospital due to social issues.  ED Course: Upon arrival to ED: Patient tachycardic, tachypneic, blood pressure in 220s, CBC shows leukocytosis of 13.1, chest x-ray negative, right knee x-ray came back negative for osteomyelitis.  Patient started on Vanco and Rocephin and was given IV labetalol and fluids.  EDP consulted orthopedic surgery for further evaluation and management.  Review  of Systems: As per HPI otherwise negative.    Past Medical History:  Diagnosis Date  . Diabetes mellitus    uncontrolled, last A1c was 14  . E. coli sepsis ([place])   . Ectopic pregnancy   . Essential hypertension, benign 11/01/2008  . Fatty liver   . Gastroparesis   . GERD (gastroesophageal reflux disease)   . Helicobacter pylori gastritis 09/07/2011  . Migraine   . Morbid obesity with body mass index (BMI) of 40.0 to 49.9 ([place])   . Pulmonary edema   . Pyelonephritis   . Ureteral obstruction     Past Surgical History:  Procedure Laterality Date  . AMPUTATION Right 02/04/2019   Procedure: RIGHT BELOW KNEE AMPUTATION;  Surgeon: Newt Minion, MD;  Location: [facility];  Service: Orthopedics;  Laterality: Right;  . ESOPHAGOGASTRODUODENOSCOPY  2010  . WOUND DEBRIDEMENT Right 01/03/2019   Procedure: DEBRIDEMENT WOUND RIGHT LATERAL FOOT;  Surgeon: Angelia Mould, MD;  Location: [facility];  Service: Vascular;  Laterality: Right;     reports that she has never smoked. She has never used smokeless tobacco. She reports previous alcohol use. She reports that she does not use drugs.  Allergies  Allergen Reactions  . Bee Venom Anaphylaxis, Hives, Shortness Of Breath and Swelling  . Lovenox [Enoxaparin Sodium] Other (See Comments)    Hyperkalemia?  . Penicillins Itching    Has patient had a PCN reaction causing immediate rash, facial/tongue/throat swelling, SOB or lightheadedness with hypotension: yes Has patient had a PCN reaction causing severe rash involving mucus membranes or skin necrosis: unknown Has patient had a PCN reaction that required hospitalization : yes Has patient had a PCN reaction occurring within  the last 10 years: no If all of the above answers are "NO", then may proceed with Cephalosporin use.     Family History  Problem Relation Age of Onset  . Arthritis Mother   . Asthma Mother   . COPD Mother   . Depression Mother   . Diabetes Mother   . Hypertension Mother    . Mental illness Mother   . Kidney disease Mother   . Crohn's disease Mother   . Aneurysm Father 10       brain  . Mental illness Sister   . Arthritis Maternal Aunt   . Asthma Maternal Aunt   . COPD Maternal Aunt   . Depression Maternal Aunt   . Diabetes Maternal Aunt   . Hypertension Maternal Aunt   . Mental illness Maternal Aunt   . Stroke Maternal Aunt   . Heart failure Maternal Aunt   . Heart failure Maternal Grandfather   . Cancer Maternal Grandfather   . Diabetes Maternal Grandfather   . Heart disease Maternal Grandfather   . Hypertension Maternal Grandfather   . Hyperlipidemia Maternal Grandfather   . Mental illness Brother   . Diabetes Maternal Grandmother   . Heart disease Maternal Grandmother   . Hypertension Maternal Grandmother   . Hyperlipidemia Maternal Grandmother   . Diabetes Paternal Grandmother   . Heart disease Paternal Grandmother   . Diabetes Paternal Grandfather   . Cancer Paternal Grandfather     Prior to Admission medications   Medication Sig Start Date End Date Taking? Authorizing Provider  gabapentin (NEURONTIN) 100 MG capsule Take 1 capsule (100 mg total) by mouth 2 (two) times daily. Patient taking differently: Take 100 mg by mouth 2 (two) times daily as needed (pain).  02/19/19  Yes Love, Ivan Anchors, PA-C  ibuprofen (ADVIL) 200 MG tablet Take 400 mg by mouth every 6 (six) hours as needed (pain).   Yes [provider]  Insulin Glargine (LANTUS SOLOSTAR) 100 UNIT/ML Solostar Pen Inject 25 Units into the skin daily. 02/19/19  Yes Love, Ivan Anchors, PA-C  insulin lispro (HUMALOG KWIKPEN) 100 UNIT/ML KwikPen Inject 0-0.08 mLs (0-8 Units total) into the skin 3 (three) times daily before meals. As directed 02/19/19  Yes Love, Ivan Anchors, PA-C  lisinopril (ZESTRIL) 2.5 MG tablet Take 1 tablet (2.5 mg total) by mouth daily. 02/20/19  Yes Love, Ivan Anchors, PA-C  acetaminophen (TYLENOL) 325 MG tablet Take 1-2 tablets (325-650 mg total) by mouth every 4  (four) hours as needed for mild pain. Patient not taking: Reported on 04/19/2019 02/13/19   Love, Ivan Anchors, PA-C  alum & mag hydroxide-simeth (MAALOX/MYLANTA) 200-200-20 MG/5ML suspension Take 30 mLs by mouth every 4 (four) hours as needed for indigestion. Patient not taking: Reported on 04/19/2019 02/13/19   Love, Ivan Anchors, PA-C  Amino Acids-Protein Hydrolys (FEEDING SUPPLEMENT, PRO-STAT SUGAR FREE 64,) LIQD Take 30 mLs by mouth 2 (two) times daily. Patient not taking: Reported on 04/19/2019 02/06/19   Flora Lipps, MD  blood glucose meter kit and supplies Dispense based on patient and insurance preference. Use up to four times daily as directed. (FOR ICD-10 E10.9, E11.9). 07/08/18   Johnson, Clanford L, MD  cefdinir (OMNICEF) 300 MG capsule Take 1 capsule (300 mg total) by mouth 2 (two) times daily. Patient not taking: Reported on 04/19/2019 03/02/19   Tommy Medal, Lavell Islam, MD  docusate sodium (COLACE) 100 MG capsule Take 1 capsule (100 mg total) by mouth 2 (two) times daily. Patient not taking: Reported  on 04/19/2019 02/06/19   Flora Lipps, MD  folic acid (FOLVITE) 1 MG tablet Take 1 tablet (1 mg total) by mouth daily. Patient not taking: Reported on 04/19/2019 02/20/19   Love, Ivan Anchors, PA-C  Insulin Pen Needle 31G X 6 MM MISC 1 pen by Does not apply route as directed. 02/19/19   Love, Ivan Anchors, PA-C  methocarbamol (ROBAXIN) 500 MG tablet Take 1 tablet (500 mg total) by mouth every 6 (six) hours as needed for muscle spasms. Patient not taking: Reported on 04/19/2019 02/19/19   Love, Ivan Anchors, PA-C  oxyCODONE (OXY IR/ROXICODONE) 5 MG immediate release tablet Take 1-2 tablets (5-10 mg total) by mouth every 8 (eight) hours as needed for moderate pain (pain score 4-6). Patient not taking: Reported on 04/19/2019 02/19/19   Love, Ivan Anchors, PA-C  pantoprazole (PROTONIX) 40 MG tablet Take 1 tablet (40 mg total) by mouth daily. Patient not taking: Reported on 04/19/2019 02/07/19   Pokhrel, Corrie Mckusick, MD   traMADol (ULTRAM) 50 MG tablet Take 1 tablet (50 mg total) by mouth every 6 (six) hours as needed for moderate pain. Patient not taking: Reported on 04/19/2019 02/19/19   Flora Lipps    Physical Exam: Vitals:   04/19/19 1500 04/19/19 1530 04/19/19 1615 04/19/19 1700  BP: (!) 222/135 (!) 194/114 (!) 181/107 (!) 184/112  Pulse: (!) 109 (!) 105 (!) 106 (!) 107  Resp: (!) 22 20 (!) 21 20  Temp:      TempSrc:      SpO2: 100% 99% 98% 100%  Weight:      Height:        Constitutional: NAD, calm, comfortable, not in acute distress, morbidly obese Eyes: PERRL, lids and conjunctivae normal ENMT: Mucous membranes are moist. Posterior pharynx clear of any exudate or lesions.Normal dentition.  Neck: normal, supple, no masses, no thyromegaly Respiratory: clear to auscultation bilaterally, no wheezing, no crackles. Normal respiratory effort. No accessory muscle use.  Cardiovascular: Regular rate and rhythm, no murmurs / rubs / gallops. No extremity edema. 2+ pedal pulses. No carotid bruits.  Abdomen: no tenderness, no masses palpated. No hepatosplenomegaly. Bowel sounds positive.  Musculoskeletal:      Neurologic: CN 2-12 grossly intact. Sensation intact, DTR normal. Strength 5/5 in all 4.  Psychiatric: Normal judgment and insight. Alert and oriented x 3. Normal mood.    Labs on Admission: I have personally reviewed following labs and imaging studies  CBC: Recent Labs  Lab 04/19/19 1313  WBC 13.1*  NEUTROABS 10.0*  HGB 12.6  HCT 37.7  MCV 83.0  PLT 536*   Basic Metabolic Panel: Recent Labs  Lab 04/19/19 1313  NA 132*  K 4.6  CL 96*  CO2 26  GLUCOSE 424*  BUN 24*  CREATININE 1.04*  CALCIUM 8.6*   GFR: Estimated Creatinine Clearance: 95.5 mL/min (A) (by C-G formula based on SCr of 1.04 mg/dL (H)). Liver Function Tests: Recent Labs  Lab 04/19/19 1313  AST 11*  ALT 16  ALKPHOS 101  BILITOT 0.8  PROT 7.3  ALBUMIN 2.6*   No results for input(s): LIPASE,  AMYLASE in the last 168 hours. No results for input(s): AMMONIA in the last 168 hours. Coagulation Profile: No results for input(s): INR, PROTIME in the last 168 hours. Cardiac Enzymes: No results for input(s): CKTOTAL, CKMB, CKMBINDEX, TROPONINI in the last 168 hours. BNP (last 3 results) No results for input(s): PROBNP in the last 8760 hours. HbA1C: No results for input(s): HGBA1C in the last 72  hours. CBG: Recent Labs  Lab 04/19/19 1645  GLUCAP 403*   Lipid Profile: No results for input(s): CHOL, HDL, LDLCALC, TRIG, CHOLHDL, LDLDIRECT in the last 72 hours. Thyroid Function Tests: No results for input(s): TSH, T4TOTAL, FREET4, T3FREE, THYROIDAB in the last 72 hours. Anemia Panel: No results for input(s): VITAMINB12, FOLATE, FERRITIN, TIBC, IRON, RETICCTPCT in the last 72 hours. Urine analysis:    Component Value Date/Time   COLORURINE AMBER (A) 02/01/2019 0025   APPEARANCEUR CLOUDY (A) 02/01/2019 0025   LABSPEC 1.022 02/01/2019 0025   PHURINE 5.0 02/01/2019 0025   GLUCOSEU >=500 (A) 02/01/2019 0025   HGBUR MODERATE (A) 02/01/2019 0025   BILIRUBINUR NEGATIVE 02/01/2019 0025   BILIRUBINUR negative 08/13/2017 1622   KETONESUR NEGATIVE 02/01/2019 0025   PROTEINUR >=300 (A) 02/01/2019 0025   UROBILINOGEN 0.2 08/13/2017 1622   UROBILINOGEN 0.2 11/25/2014 0553   NITRITE NEGATIVE 02/01/2019 0025   LEUKOCYTESUR NEGATIVE 02/01/2019 0025    Radiological Exams on Admission: DG Chest 1 View  Result Date: 04/19/2019 CLINICAL DATA:  Fever and chills EXAM: CHEST  1 VIEW COMPARISON:  02/01/2019 FINDINGS: The heart size and mediastinal contours are within normal limits. Both lungs are clear. The visualized skeletal structures are unremarkable. IMPRESSION: No active disease. Electronically Signed   By: Inez Catalina M.D.   On: 04/19/2019 16:06   DG Knee Complete 4 Views Right  Result Date: 04/19/2019 CLINICAL DATA:  Known right BKA with fever and increased purulence EXAM: RIGHT KNEE -  COMPLETE 4+ VIEW COMPARISON:  None. FINDINGS: Changes of below the knee amputation are seen. Diffuse soft tissue edema is noted. No definitive bony erosive changes are noted to suggest osteomyelitis. The joint appears within normal limits. IMPRESSION: Changes consistent with BKA and soft tissue swelling. No osteomyelitis changes are seen. Electronically Signed   By: Inez Catalina M.D.   On: 04/19/2019 16:04    EKG: Normal sinus rhythm, no ST elevation or depression noted. Assessment/Plan Principal Problem:   Sepsis ([place]) Active Problems:   HLD (hyperlipidemia)   BMI 45.0-49.9, adult (HCC)   DM (diabetes mellitus) (HCC)   Hyponatremia   Unilateral complete BKA, right, sequela (H[facility])   GERD (gastroesophageal reflux disease)   Hypertensive urgency   Sepsis: -Likely secondary to right BKA stump site cellulitis?.  Patient presented with tachycardia, tachypnea, leukocytosis of 13.  Lactic acid: WNL.  UA is pending. -Blood culture is pending.  Reviewed right knee x-ray result which shows soft tissue swelling but no osteomyelitis.  Chest x-ray is negative.  POC COVID-19 antigen test is negative. -Admit patient on the floor for close monitoring. -Check sed rate and CRP.  Started patient on IV fluids, IV Vanco and cefepime for broad-spectrum coverage. -EDP consulted orthopedic surgery-patient will be evaluated by Dr. Sharol Given tomorrow a.m. -Consult wound care and PT  Hypertensive urgency: -Patient's blood pressure was 222/135 upon arrival.  Likely secondary to noncompliance with home medications. -Patient received IV labetalol in the ED. -She takes lisinopril at home-we will continue same -Labetalol as needed if blood pressure more than 160/100 -Monitor blood pressure closely.  Diabetes mellitus: Check A1c -She takes Lantus and insulin lispro at home.  Has not been compliant due to financial issues since her discharge from the hospital. -Started patient on sliding scale insulin and monitor blood  sugar closely -We will consult diabetic counselor  Hyponatremia: Chronic -Sodium is 132.  Monitor for now.  GERD: Stable -Continue PPI  Diabetic neuropathy: Continue gabapentin  Anxiety: Started patient on hydroxyzine as  needed for anxiety.  She currently does not take any medication for depression or anxiety at home.  Consulted case manager for medicines arrangement at the time of discharge.  DVT prophylaxis: TED/SCD Code Status: Full code Family Communication: Patient's fianc present at bedside.  Plan of care discussed with patient and her fianc in length and they verbalized understanding and agreed with it. Disposition Plan: To be determined Consults called: Orthopedic surgery by EDP Admission status: Inpatient   Mckinley Jewel MD [facility] Pager [phone]  If 7PM-7AM, please contact night-coverage [email or web address] Password [facility]  04/19/2019, 5:41 PM

## 2019-04-19 NOTE — ED Triage Notes (Addendum)
Pt states she had R BKA in September.  Lost her job and insurance and never followed up with Psychologist, sport and exercise for staple removal.  States incision site started to look infected 2-3 days ago so she removed staples.  Reports chills, diaphoresis, and vomiting since yesterday.  C/o redness and small amount of drainage at incision site.

## 2019-04-19 NOTE — Progress Notes (Addendum)
Pharmacy Antibiotic Note  Sheila Austin is a 33 y.o. female admitted on 04/19/2019 with cellulitis.  Pharmacy has been consulted for Cefepime and Vancomycin dosing. Patient had a R BKA in sep and never reported back to the surgeon. Patient had osteomyelitis previously on the leg prior to amputation.   Height: 5\' 2"  (157.5 cm) Weight: 260 lb (117.9 kg) IBW/kg (Calculated) : 50.1  Temp (24hrs), Avg:98.2 F (36.8 C), Min:97.9 F (36.6 C), Max:98.4 F (36.9 C)  Recent Labs  Lab 04/19/19 1313  WBC 13.1*  CREATININE 1.04*  LATICACIDVEN 1.6    Estimated Creatinine Clearance: 95.5 mL/min (A) (by C-G formula based on SCr of 1.04 mg/dL (H)).    Allergies  Allergen Reactions  . Bee Venom Anaphylaxis, Hives, Shortness Of Breath and Swelling  . Lovenox [Enoxaparin Sodium]     Hyperkalemia?  . Penicillins Itching    Has patient had a PCN reaction causing immediate rash, facial/tongue/throat swelling, SOB or lightheadedness with hypotension: yes Has patient had a PCN reaction causing severe rash involving mucus membranes or skin necrosis: unknown Has patient had a PCN reaction that required hospitalization : yes Has patient had a PCN reaction occurring within the last 10 years: no If all of the above answers are "NO", then may proceed with Cephalosporin use.     Antimicrobials this admission: 12/13 Ceftriaxone >> x1 in ED  12/13 Cefepime >> 12/13 Vancomycin >>   Dose adjustments this admission:   Microbiology results: 12/13 BCx: Pending 12/13 UCx: Pending   Plan:  - Cefepime 2g IV q8h  - Vancomycin 2500 mg IV x 1 dose  - Followed by Vancomycin 1750 mg IV q24h - Est Calc AUC 531  - Monitor patients Renal fxn, imaging, and cultures  Thank you for allowing pharmacy to be a part of this patient's care.  Duanne Limerick PharmD. BCPS  04/19/2019 2:41 PM

## 2019-04-20 DIAGNOSIS — E1159 Type 2 diabetes mellitus with other circulatory complications: Secondary | ICD-10-CM

## 2019-04-20 DIAGNOSIS — E7849 Other hyperlipidemia: Secondary | ICD-10-CM

## 2019-04-20 DIAGNOSIS — I160 Hypertensive urgency: Secondary | ICD-10-CM

## 2019-04-20 DIAGNOSIS — E871 Hypo-osmolality and hyponatremia: Secondary | ICD-10-CM

## 2019-04-20 DIAGNOSIS — K219 Gastro-esophageal reflux disease without esophagitis: Secondary | ICD-10-CM

## 2019-04-20 DIAGNOSIS — Z6841 Body Mass Index (BMI) 40.0 and over, adult: Secondary | ICD-10-CM

## 2019-04-20 DIAGNOSIS — Z794 Long term (current) use of insulin: Secondary | ICD-10-CM

## 2019-04-20 DIAGNOSIS — S88111S Complete traumatic amputation at level between knee and ankle, right lower leg, sequela: Secondary | ICD-10-CM

## 2019-04-20 LAB — URINALYSIS, ROUTINE W REFLEX MICROSCOPIC
Bilirubin Urine: NEGATIVE
Glucose, UA: >=500 mg/dL — AB
Ketones, ur: 5 mg/dL — AB
Leukocytes,Ua: NEGATIVE
Nitrite: NEGATIVE
Protein, ur: >=300 mg/dL — AB
Specific Gravity, Urine: 1.021 (ref 1.005–1.030)
pH: 8 (ref 5.0–8.0)

## 2019-04-20 LAB — MAGNESIUM: Magnesium: 1.7 mg/dL (ref 1.7–2.4)

## 2019-04-20 LAB — COMPREHENSIVE METABOLIC PANEL
ALT: 12 U/L (ref 0–44)
AST: 14 U/L — ABNORMAL LOW (ref 15–41)
Albumin: 2.2 g/dL — ABNORMAL LOW (ref 3.5–5.0)
Alkaline Phosphatase: 78 U/L (ref 38–126)
Anion gap: 10 (ref 5–15)
BUN: 23 mg/dL — ABNORMAL HIGH (ref 6–20)
CO2: 23 mmol/L (ref 22–32)
Calcium: 7.9 mg/dL — ABNORMAL LOW (ref 8.9–10.3)
Chloride: 105 mmol/L (ref 98–111)
Creatinine, Ser: 1.13 mg/dL — ABNORMAL HIGH (ref 0.44–1.00)
GFR calc Af Amer: >60 mL/min (ref >60)
GFR calc non Af Amer: >60 mL/min (ref >60)
Glucose, Bld: 257 mg/dL — ABNORMAL HIGH (ref 70–99)
Potassium: 4 mmol/L (ref 3.5–5.1)
Sodium: 138 mmol/L (ref 135–145)
Total Bilirubin: 0.6 mg/dL (ref 0.3–1.2)
Total Protein: 6 g/dL — ABNORMAL LOW (ref 6.5–8.1)

## 2019-04-20 LAB — GLUCOSE, CAPILLARY
Glucose-Capillary: 242 mg/dL — ABNORMAL HIGH (ref 70–99)
Glucose-Capillary: 285 mg/dL — ABNORMAL HIGH (ref 70–99)

## 2019-04-20 LAB — CBC
HCT: 32.1 % — ABNORMAL LOW (ref 36.0–46.0)
Hemoglobin: 10.7 g/dL — ABNORMAL LOW (ref 12.0–15.0)
MCH: 27.6 pg (ref 26.0–34.0)
MCHC: 33.3 g/dL (ref 30.0–36.0)
MCV: 82.7 fL (ref 80.0–100.0)
Platelets: 437 10*3/uL — ABNORMAL HIGH (ref 150–400)
RBC: 3.88 MIL/uL (ref 3.87–5.11)
RDW: 15.2 % (ref 11.5–15.5)
WBC: 13.1 10*3/uL — ABNORMAL HIGH (ref 4.0–10.5)
nRBC: 0 % (ref 0.0–0.2)

## 2019-04-20 LAB — HEMOGLOBIN A1C
Hgb A1c MFr Bld: 10.6 % — ABNORMAL HIGH (ref 4.8–5.6)
Mean Plasma Glucose: 257.52 mg/dL

## 2019-04-20 LAB — PHOSPHORUS: Phosphorus: 3.9 mg/dL (ref 2.5–4.6)

## 2019-04-20 LAB — SARS CORONAVIRUS 2 (TAT 6-24 HRS): SARS Coronavirus 2: NEGATIVE

## 2019-04-20 LAB — C-REACTIVE PROTEIN: CRP: 0.7 mg/dL (ref <1.0)

## 2019-04-20 LAB — SEDIMENTATION RATE: Sed Rate: 74 mm/hr — ABNORMAL HIGH (ref 0–22)

## 2019-04-20 LAB — PROCALCITONIN: Procalcitonin: <0.1 ng/mL

## 2019-04-20 MED ORDER — LISINOPRIL 2.5 MG PO TABS: 2.5000 mg | ORAL_TABLET | Freq: Every day | ORAL | 0 refills | Status: DC

## 2019-04-20 MED ORDER — NOVOLIN 70/30 FLEXPEN (70-30) 100 UNIT/ML ~~LOC~~ SUPN: 14.0000 [IU] | PEN_INJECTOR | Freq: Two times a day (BID) | SUBCUTANEOUS | 0 refills | Status: DC

## 2019-04-20 MED ORDER — INSULIN GLARGINE 100 UNIT/ML ~~LOC~~ SOLN
18.0000 [IU] | Freq: Every day | SUBCUTANEOUS | Status: DC
  Filled 2019-04-20: qty 0.18

## 2019-04-20 NOTE — Evaluation (Signed)
Physical Therapy Evaluation Patient Details Name: Sheila Austin MRN: [id number] DOB: Jul 01, 1985 Today's Date: 04/20/2019   History of Present Illness  Sheila Austin is a 33 y.o. 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 Sharol Given presents to emergency department due to vomiting, chills and erythema at stem site.  Clinical Impression  Pt admitted with above. Pt functioning at mod I with bed mobility and std pvt transfer to chair. Pt able to walk 50' with RW. Pt reports she amb in her home but uses w/c in community. Pt with depressed spirits as pt has lost her mother this year, she has lost her job due to having her R BKA, as well as her fiance. Pt blaming herself for their lack of income. Acute PT to monitor pt during hospital stay to promote mobility and maintain strength and endurance.    Follow Up Recommendations No PT follow up;Supervision - Intermittent    Equipment Recommendations  None recommended by PT    Recommendations for Other Services       Precautions / Restrictions Precautions Precautions: Fall Precaution Comments: R residual limb incision with noted redness and some drainage Restrictions Weight Bearing Restrictions: Yes RLE Weight Bearing: Non weight bearing      Mobility  Bed Mobility Overal bed mobility: Modified Independent             General bed mobility comments: HOB elevated, used bed rail  Transfers Overall transfer level: Modified independent Equipment used: None             General transfer comment: pt completed stand pvt from EOB to chair using arm rest on chair, no AD  Ambulation/Gait Ambulation/Gait assistance: Min guard Gait Distance (Feet): 50 Feet(to the door and back) Assistive device: Rolling walker (2  wheeled) Gait Pattern/deviations: Step-to pattern Gait velocity: dec Gait velocity interpretation: <1.31 ft/sec, indicative of household ambulator General Gait Details: pt reports "this is more slippery than i'm used too", pt able to manage walker and clear foot, encouraged pt not to hop past front of RW  Stairs            Wheelchair Mobility    Modified Rankin (Stroke Patients Only)       Balance Overall balance assessment: Mild deficits observed, not formally tested(needs RW for safe standing d/t R BKA)                                           Pertinent Vitals/Pain Pain Assessment: No/denies pain    Home Living Family/patient expects to be discharged to:: Private residence Living Arrangements: Spouse/significant other Available Help at Discharge: Family Type of Home: Mobile home Home Access: Ramped entrance     Home Layout: One level Home Equipment: Environmental consultant - 2 wheels;Bedside commode;Wheelchair - manual(w/c cushion)      Prior Function Level of Independence: Independent with assistive device(s)         Comments: pt amb in home with RW, uses w/c for community mobility     Hand Dominance   Dominant Hand: Right    Extremity/Trunk Assessment   Upper Extremity Assessment Upper Extremity Assessment: Overall WFL for tasks assessed    Lower Extremity Assessment Lower Extremity Assessment: RLE deficits/detail RLE Deficits / Details:  BKA, pt with good R hip and knee ROM    Cervical / Trunk Assessment Cervical / Trunk Assessment: Normal  Communication   Communication: No difficulties  Cognition Arousal/Alertness: Awake/alert Behavior During Therapy: WFL for tasks assessed/performed Overall Cognitive Status: Within Functional Limits for tasks assessed                                 General Comments: pt with depressed spirits. pt lost her mom this past january, she lost her job here at [employer], as well as her fiance  loosing his job however recently started some where else      General Comments General comments (skin integrity, edema, etc.): pt with redness and mild swelling at R LE incision    Exercises     Assessment/Plan    PT Assessment Patient needs continued PT services  PT Problem List Decreased strength;Decreased activity tolerance;Decreased balance;Decreased mobility       PT Treatment Interventions DME instruction;Gait training;Stair training;Functional mobility training;Therapeutic activities;Therapeutic exercise;Balance training    PT Goals (Current goals can be found in the Care Plan section)  Acute Rehab PT Goals Patient Stated Goal: home PT Goal Formulation: With patient Time For Goal Achievement: 05/04/19 Potential to Achieve Goals: Good    Frequency Min 2X/week   Barriers to discharge        Co-evaluation               AM-PAC PT "6 Clicks" Mobility  Outcome Measure Help needed turning from your back to your side while in a flat bed without using bedrails?: None Help needed moving from lying on your back to sitting on the side of a flat bed without using bedrails?: None Help needed moving to and from a bed to a chair (including a wheelchair)?: None Help needed standing up from a chair using your arms (e.g., wheelchair or bedside chair)?: None Help needed to walk in hospital room?: A Little Help needed climbing 3-5 steps with a railing? : A Lot 6 Click Score: 21    End of Session   Activity Tolerance: Patient tolerated treatment well Patient left: in chair;with call bell/phone within reach Nurse Communication: Mobility status PT Visit Diagnosis: Unsteadiness on feet (R26.81);Difficulty in walking, not elsewhere classified (R26.2)    Time: [id number] PT Time Calculation (min) (ACUTE ONLY): 27 min   Charges:   PT Evaluation $PT Eval Moderate Complexity: 1 Mod PT Treatments $Gait Training: 8-22 mins        Kittie Plater, PT, DPT Acute  Rehabilitation Services Pager #: [phone] Office #: [phone]    Berline Lopes 04/20/2019, 9:11 AM

## 2019-04-20 NOTE — Consult Note (Signed)
ORTHOPAEDIC CONSULTATION  REQUESTING PHYSICIAN: Truett Mainland, DO  Chief Complaint: Right transtibial amputation  HPI: Sheila Austin is a 33 y.o. female who presents with right transtibial amputation  Past Medical History:  Diagnosis Date  . Diabetes mellitus    uncontrolled, last A1c was 14  . E. coli sepsis ([place])   . Ectopic pregnancy   . Essential hypertension, benign 11/01/2008  . Fatty liver   . Gastroparesis   . GERD (gastroesophageal reflux disease)   . Helicobacter pylori gastritis 09/07/2011  . Migraine   . Morbid obesity with body mass index (BMI) of 40.0 to 49.9 ([place])   . Pulmonary edema   . Pyelonephritis   . Ureteral obstruction    Past Surgical History:  Procedure Laterality Date  . AMPUTATION Right 02/04/2019   Procedure: RIGHT BELOW KNEE AMPUTATION;  Surgeon: Newt Minion, MD;  Location: [facility];  Service: Orthopedics;  Laterality: Right;  . ESOPHAGOGASTRODUODENOSCOPY  2010  . WOUND DEBRIDEMENT Right 01/03/2019   Procedure: DEBRIDEMENT WOUND RIGHT LATERAL FOOT;  Surgeon: Angelia Mould, MD;  Location: [facility] OR;  Service: Vascular;  Laterality: Right;   Social History   Socioeconomic History  . Marital status: Divorced    Spouse name: Roderic Palau  . Number of children: 0  . Years of education: 82  . Highest education level: Not on file  Occupational History  . Occupation: Biomedical scientist: [employer]  Tobacco Use  . Smoking status: Never Smoker  . Smokeless tobacco: Never Used  Substance and Sexual Activity  . Alcohol use: Not Currently  . Drug use: No  . Sexual activity: Yes    Partners: Male    Birth control/protection: Condom    Comment: Pt is interested in taking BCP  Other Topics Concern  . Not on file  Social History Narrative   The patient is single, separated from her husband 01/2015.   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 Difficulty of Paying Living Expenses: Somewhat hard  Food Insecurity: Food Insecurity Present  . Worried About Charity fundraiser in the Last Year: Sometimes true  . Ran Out of Food in the Last Year: Sometimes true  Transportation Needs: No Transportation Needs  . Lack of Transportation (Medical): No  . Lack of Transportation (Non-Medical): No  Physical Activity: Inactive  . Days of Exercise per Week: 0 days  . Minutes of Exercise per Session: 0 min  Stress: Stress Concern Present  . Feeling of Stress : To some extent  Social Connections: Moderately Isolated  . Frequency of Communication with Friends and Family: Three times a week  . Frequency of Social Gatherings with Friends and Family: Three times a week  . Attends Religious Services: Never  . Active Member of Clubs or Organizations: No  . Attends Archivist Meetings: Never  . Marital Status: Divorced   Family History  Problem Relation Age of Onset  . Arthritis Mother   . Asthma Mother   . COPD Mother   . Depression Mother   . Diabetes Mother   . Hypertension Mother   . Mental illness Mother   . Kidney disease Mother   . Crohn's disease Mother   . Aneurysm Father 25       brain  .  Mental illness Sister   . Arthritis Maternal Aunt   . Asthma Maternal Aunt   . COPD Maternal Aunt   . Depression Maternal Aunt   . Diabetes Maternal Aunt   . Hypertension Maternal Aunt   . Mental illness Maternal Aunt   . Stroke Maternal Aunt   . Heart failure Maternal Aunt   . Heart failure Maternal Grandfather   . Cancer Maternal Grandfather   . Diabetes Maternal Grandfather   . Heart disease Maternal Grandfather   . Hypertension Maternal Grandfather   . Hyperlipidemia Maternal Grandfather   . Mental illness Brother   . Diabetes Maternal Grandmother   . Heart disease Maternal Grandmother   . Hypertension Maternal Grandmother   . Hyperlipidemia Maternal  Grandmother   . Diabetes Paternal Grandmother   . Heart disease Paternal Grandmother   . Diabetes Paternal Grandfather   . Cancer Paternal Grandfather    - negative except otherwise stated in the family history section Allergies  Allergen Reactions  . Bee Venom Anaphylaxis, Hives, Shortness Of Breath and Swelling  . Lovenox [Enoxaparin Sodium] Other (See Comments)    Hyperkalemia?  . Penicillins Itching    Has patient had a PCN reaction causing immediate rash, facial/tongue/throat swelling, SOB or lightheadedness with hypotension: yes Has patient had a PCN reaction causing severe rash involving mucus membranes or skin necrosis: unknown Has patient had a PCN reaction that required hospitalization : yes Has patient had a PCN reaction occurring within the last 10 years: no If all of the above answers are "NO", then may proceed with Cephalosporin use.    Prior to Admission medications   Medication Sig Start Date End Date Taking? Authorizing Provider  gabapentin (NEURONTIN) 100 MG capsule Take 1 capsule (100 mg total) by mouth 2 (two) times daily. Patient taking differently: Take 100 mg by mouth 2 (two) times daily as needed (pain).  02/19/19  Yes Love, Ivan Anchors, PA-C  ibuprofen (ADVIL) 200 MG tablet Take 400 mg by mouth every 6 (six) hours as needed (pain).   Yes [provider]  Insulin Glargine (LANTUS SOLOSTAR) 100 UNIT/ML Solostar Pen Inject 25 Units into the skin daily. 02/19/19  Yes Love, Ivan Anchors, PA-C  insulin lispro (HUMALOG KWIKPEN) 100 UNIT/ML KwikPen Inject 0-0.08 mLs (0-8 Units total) into the skin 3 (three) times daily before meals. As directed 02/19/19  Yes Love, Ivan Anchors, PA-C  lisinopril (ZESTRIL) 2.5 MG tablet Take 1 tablet (2.5 mg total) by mouth daily. 02/20/19  Yes Love, Ivan Anchors, PA-C  acetaminophen (TYLENOL) 325 MG tablet Take 1-2 tablets (325-650 mg total) by mouth every 4 (four) hours as needed for mild pain. Patient not taking: Reported on 04/19/2019  02/13/19   Love, Ivan Anchors, PA-C  alum & mag hydroxide-simeth (MAALOX/MYLANTA) 200-200-20 MG/5ML suspension Take 30 mLs by mouth every 4 (four) hours as needed for indigestion. Patient not taking: Reported on 04/19/2019 02/13/19   Love, Ivan Anchors, PA-C  Amino Acids-Protein Hydrolys (FEEDING SUPPLEMENT, PRO-STAT SUGAR FREE 64,) LIQD Take 30 mLs by mouth 2 (two) times daily. Patient not taking: Reported on 04/19/2019 02/06/19   Flora Lipps, MD  blood glucose meter kit and supplies Dispense based on patient and insurance preference. Use up to four times daily as directed. (FOR ICD-10 E10.9, E11.9). 07/08/18   Johnson, Clanford L, MD  cefdinir (OMNICEF) 300 MG capsule Take 1 capsule (300 mg total) by mouth 2 (two) times daily. Patient not taking: Reported on 04/19/2019 03/02/19   Tommy Medal, Lavell Islam,  MD  docusate sodium (COLACE) 100 MG capsule Take 1 capsule (100 mg total) by mouth 2 (two) times daily. Patient not taking: Reported on 04/19/2019 02/06/19   Flora Lipps, MD  folic acid (FOLVITE) 1 MG tablet Take 1 tablet (1 mg total) by mouth daily. Patient not taking: Reported on 04/19/2019 02/20/19   Love, Ivan Anchors, PA-C  Insulin Pen Needle 31G X 6 MM MISC 1 pen by Does not apply route as directed. 02/19/19   Love, Ivan Anchors, PA-C  methocarbamol (ROBAXIN) 500 MG tablet Take 1 tablet (500 mg total) by mouth every 6 (six) hours as needed for muscle spasms. Patient not taking: Reported on 04/19/2019 02/19/19   Love, Ivan Anchors, PA-C  oxyCODONE (OXY IR/ROXICODONE) 5 MG immediate release tablet Take 1-2 tablets (5-10 mg total) by mouth every 8 (eight) hours as needed for moderate pain (pain score 4-6). Patient not taking: Reported on 04/19/2019 02/19/19   Love, Ivan Anchors, PA-C  pantoprazole (PROTONIX) 40 MG tablet Take 1 tablet (40 mg total) by mouth daily. Patient not taking: Reported on 04/19/2019 02/07/19   Pokhrel, Corrie Mckusick, MD  traMADol (ULTRAM) 50 MG tablet Take 1 tablet (50 mg total) by mouth every 6 (six)  hours as needed for moderate pain. Patient not taking: Reported on 04/19/2019 02/19/19   Flora Lipps   DG Chest 1 View  Result Date: 04/19/2019 CLINICAL DATA:  Fever and chills EXAM: CHEST  1 VIEW COMPARISON:  02/01/2019 FINDINGS: The heart size and mediastinal contours are within normal limits. Both lungs are clear. The visualized skeletal structures are unremarkable. IMPRESSION: No active disease. Electronically Signed   By: Inez Catalina M.D.   On: 04/19/2019 16:06   DG Knee Complete 4 Views Right  Result Date: 04/19/2019 CLINICAL DATA:  Known right BKA with fever and increased purulence EXAM: RIGHT KNEE - COMPLETE 4+ VIEW COMPARISON:  None. FINDINGS: Changes of below the knee amputation are seen. Diffuse soft tissue edema is noted. No definitive bony erosive changes are noted to suggest osteomyelitis. The joint appears within normal limits. IMPRESSION: Changes consistent with BKA and soft tissue swelling. No osteomyelitis changes are seen. Electronically Signed   By: Inez Catalina M.D.   On: 04/19/2019 16:04   - pertinent xrays, CT, MRI studies were reviewed and independently interpreted  Positive ROS: All other systems have been reviewed and were otherwise negative with the exception of those mentioned in the HPI and as above.  Physical Exam: General: Alert, no acute distress Psychiatric: Patient is competent for consent with normal mood and affect Lymphatic: No axillary or cervical lymphadenopathy Cardiovascular: No pedal edema Respiratory: No cyanosis, no use of accessory musculature GI: No organomegaly, abdomen is soft and non-tender    Images:  _0 @  Labs:  Lab Results  Component Value Date   HGBA1C 10.6 (H) 04/19/2019   HGBA1C 10.8 (H) 02/01/2019   HGBA1C 13.6 (H) 07/03/2018   ESRSEDRATE 74 (H) 04/19/2019   ESRSEDRATE 111 (H) 02/23/2019   ESRSEDRATE >140 (H) 02/17/2019   CRP 0.7 04/19/2019   CRP 1.3 (H) 02/17/2019   CRP 4.9 (H) 02/11/2019    REPTSTATUS 02/10/2019 FINAL 02/05/2019   GRAMSTAIN  02/04/2019    RARE WBC PRESENT, PREDOMINANTLY PMN NO ORGANISMS SEEN    CULT  02/05/2019    NO GROWTH 5 DAYS Performed at [facility], [place] [address] [place], [place] [address]    [place] 02/01/2019    Lab Results  Component Value Date  ALBUMIN 2.2 (L) 04/20/2019   ALBUMIN 2.6 (L) 04/19/2019   ALBUMIN 1.4 (L) 02/11/2019   PREALBUMIN 5.3 (L) 02/01/2019    Neurologic: Patient does not have protective sensation bilateral lower extremities.   MUSCULOSKELETAL:   Skin: Examination of the skin is well-healed no ulcers no cellulitis no drainage  Patient is asymptomatic no complaints of pain full range of motion of her knee  Assessment: Stable well-healing transtibial amputation  Plan: Patient is to use her stump shrinker follow-up in the office.  Thank you for the consult and the opportunity to see Ms. Doree Barthel, [place] [phone] 6:53 AM

## 2019-04-20 NOTE — Patient Outreach (Signed)
  Unfortunately, our Care management team will not be able to assist Sheila Austin.   The patient is not on the current member enrollment rosters for any of the [facility] risk contracted plans.

## 2019-04-20 NOTE — Progress Notes (Signed)
Patient ID: [name], female   DOB: [birth year], 33 y.o.   MRN: [id number] Patient is seen in follow-up for her right transtibial amputation.  The incision is well-healed there is no cellulitis no signs of infection she has full range of motion of her knee there is no tenderness to palpation patient states she is pleased with how her leg is feeling.  Patient does have a stump shrinker from [employer] and she will wear this when she leaves.  We will have an Ace wrap applied today to provide compression.

## 2019-04-20 NOTE — Care Management (Addendum)
Patient entered into procare with dates from 12/14 to 12/22. Patient provided with [facility] letter.  CMA to schedule follow up appointment with [facility] who can assist with medications after DC beyond 30 days.

## 2019-04-20 NOTE — Discharge Instructions (Signed)
Hyperglycemia Hyperglycemia occurs when the level of sugar (glucose) in the blood is too high. Glucose is a type of sugar that provides the body's main source of energy. Certain hormones (insulin and glucagon) control the level of glucose in the blood. Insulin lowers blood glucose, and glucagon increases blood glucose. Hyperglycemia can result from having too little insulin in the bloodstream, or from the body not responding normally to insulin. Hyperglycemia occurs most often in people who have diabetes (diabetes mellitus), but it can happen in people who do not have diabetes. It can develop quickly, and it can be life-threatening if it causes you to become severely dehydrated (diabetic ketoacidosis or hyperglycemic hyperosmolar state). Severe hyperglycemia is a medical emergency. What are the causes? If you have diabetes, hyperglycemia may be caused by:  Diabetes medicine.  Medicines that increase blood glucose or affect your diabetes control.  Not eating enough, or not eating often enough.  Changes in physical activity level.  Being sick or having an infection. If you have prediabetes or undiagnosed diabetes:  Hyperglycemia may be caused by those conditions. If you do not have diabetes, hyperglycemia may be caused by:  Certain medicines, including steroid medicines, beta-blockers, epinephrine, and thiazide diuretics.  Stress.  Serious illness.  Surgery.  Diseases of the pancreas.  Infection. What increases the risk? Hyperglycemia is more likely to develop in people who have risk factors for diabetes, such as:  Having a family member with diabetes.  Having a gene for type 1 diabetes that is passed from parent to child (inherited).  Living in an area with cold weather conditions.  Exposure to certain viruses.  Certain conditions in which the body's disease-fighting (immune) system attacks itself (autoimmune disorders).  Being overweight or obese.  Having an  inactive (sedentary) lifestyle.  Having been diagnosed with insulin resistance.  Having a history of prediabetes, gestational diabetes, or polycystic ovarian syndrome (PCOS).  Being of American-Indian, African-American, Hispanic/Latino, or Asian/Pacific Islander descent. What are the signs or symptoms? Hyperglycemia may not cause any symptoms. If you do have symptoms, they may include early warning signs, such as:  Increased thirst.  Hunger.  Feeling very tired.  Needing to urinate more often than usual.  Blurry vision. Other symptoms may develop if hyperglycemia gets worse, such as:  Dry mouth.  Loss of appetite.  Fruity-smelling breath.  Weakness.  Unexpected or rapid weight gain or weight loss.  Tingling or numbness in the hands or feet.  Headache.  Skin that does not quickly return to normal after being lightly pinched and released (poor skin turgor).  Abdominal pain.  Cuts or bruises that are slow to heal. How is this diagnosed? Hyperglycemia is diagnosed with a blood test to measure your blood glucose level. This blood test is usually done while you are having symptoms. Your health care provider may also do a physical exam and review your medical history. You may have more tests to determine the cause of your hyperglycemia, such as:  A fasting blood glucose (FBG) test. You will not be allowed to eat (you will fast) for at least 8 hours before a blood sample is taken.  An A1c (hemoglobin A1c) blood test. This provides information about blood glucose control over the previous 2-3 months.  An oral glucose tolerance test (OGTT). This measures your blood glucose at two times: ? After fasting. This is your baseline blood glucose level. ? Two hours after drinking a beverage that contains glucose. How is this treated? Treatment depends on  the cause of your hyperglycemia. Treatment may include:  Taking medicine to regulate your blood glucose levels. If you take  insulin or other diabetes medicines, your medicine or dosage may be adjusted.  Lifestyle changes, such as exercising more, eating healthier foods, or losing weight.  Treating an illness or infection, if this caused your hyperglycemia.  Checking your blood glucose more often.  Stopping or reducing steroid medicines, if these caused your hyperglycemia. If your hyperglycemia becomes severe and it results in hyperglycemic hyperosmolar state, you must be hospitalized and given IV fluids. Follow these instructions at home:  General instructions  Take over-the-counter and prescription medicines only as told by your health care provider.  Do not use any products that contain nicotine or tobacco, such as cigarettes and e-cigarettes. If you need help quitting, ask your health care provider.  Limit alcohol intake to no more than 1 drink per day for nonpregnant women and 2 drinks per day for men. One drink equals 12 oz of beer, 5 oz of wine, or 1 oz of hard liquor.  Learn to manage stress. If you need help with this, ask your health care provider.  Keep all follow-up visits as told by your health care provider. This is important. Eating and drinking   Maintain a healthy weight.  Exercise regularly, as directed by your health care provider.  Stay hydrated, especially when you exercise, get sick, or spend time in hot temperatures.  Eat healthy foods, such as: ? Lean proteins. ? Complex carbohydrates. ? Fresh fruits and vegetables. ? Low-fat dairy products. ? Healthy fats.  Drink enough fluid to keep your urine clear or pale yellow. If you have diabetes:  Make sure you know the symptoms of hyperglycemia.  Follow your diabetes management plan, as told by your health care provider. Make sure you: ? Take your insulin and medicines as directed. ? Follow your exercise plan. ? Follow your meal plan. Eat on time, and do not skip meals. ? Check your blood glucose as often as directed. Make  sure to check your blood glucose before and after exercise. If you exercise longer or in a different way than usual, check your blood glucose more often. ? Follow your sick day plan whenever you cannot eat or drink normally. Make this plan in advance with your health care provider.  Share your diabetes management plan with people in your workplace, school, and household.  Check your urine for ketones when you are ill and as told by your health care provider.  Carry a medical alert card or wear medical alert jewelry. Contact a health care provider if:  Your blood glucose is at or above 240 mg/dL (13.3 mmol/L) for 2 days in a row.  You have problems keeping your blood glucose in your target range.  You have frequent episodes of hyperglycemia. Get help right away if:  You have difficulty breathing.  You have a change in how you think, feel, or act (mental status).  You have nausea or vomiting that does not go away. These symptoms may represent a serious problem that is an emergency. Do not wait to see if the symptoms will go away. Get medical help right away. Call your local emergency services (911 in the [place] Do not drive yourself to the hospital. Summary  Hyperglycemia occurs when the level of sugar (glucose) in the blood is too high.  Hyperglycemia is diagnosed with a blood test to measure your blood glucose level. This blood test is usually done while  you are having symptoms. Your health care provider may also do a physical exam and review your medical history.  If you have diabetes, follow your diabetes management plan as told by your health care provider.  Contact your health care provider if you have problems keeping your blood glucose in your target range. This information is not intended to replace advice given to you by your health care provider. Make sure you discuss any questions you have with your health care provider. Document Released: 10/17/2000 Document Revised:  01/09/2016 Document Reviewed: 01/09/2016 Elsevier Patient Education  [place].  Hemoglobin A1c Test Why am I having this test? You may have the hemoglobin A1c test (HbA1c test) done to:  Evaluate your risk for developing diabetes (diabetes mellitus).  Diagnose diabetes.  Monitor long-term control of blood sugar (glucose) in people who have diabetes and help make treatment decisions. This test may be done with other blood glucose tests, such as fasting blood glucose and oral glucose tolerance tests. What is being tested? Hemoglobin is a type of protein in the blood that carries oxygen. Glucose attaches to hemoglobin to form glycated hemoglobin. This test checks the amount of glycated hemoglobin in your blood, which is a good indicator of the average amount of glucose in your blood during the past 2-3 months. What kind of sample is taken?  A blood sample is required for this test. It is usually collected by inserting a needle into a blood vessel. Tell a health care provider about:  All medicines you are taking, including vitamins, herbs, eye drops, creams, and over-the-counter medicines.  Any blood disorders you have.  Any surgeries you have had.  Any medical conditions you have.  Whether you are pregnant or may be pregnant. How are the results reported? Your results will be reported as a percentage that indicates how much of your hemoglobin has glucose attached to it (is glycated). Your health care provider will compare your results to normal ranges that were established after testing a large group of people (reference ranges). Reference ranges may vary among labs and hospitals. For this test, common reference ranges are:  Adult or child without diabetes: 4-5.6%.  Adult or child with diabetes and good blood glucose control: less than 7%. What do the results mean? If you have diabetes:  A result of less than 7% is considered normal, meaning that your blood glucose is  well controlled.  A result higher than 7% means that your blood glucose is not well controlled, and your treatment plan may need to be adjusted. If you do not have diabetes:  A result within the reference range is considered normal, meaning that you are not at high risk for diabetes.  A result of 5.7-6.4% means that you have a high risk of developing diabetes, and you may have prediabetes. Prediabetes is the condition of having a blood glucose level that is higher than it should be, but not high enough for you to be diagnosed with diabetes. Having prediabetes puts you at risk for developing type 2 diabetes (type 2 diabetes mellitus). You may have more tests, including a repeat HbA1c test.  Results of 6.5% or higher on two separate HbA1c tests mean that you have diabetes. You may have more tests to confirm the diagnosis. Abnormally low HbA1c values may be caused by:  Pregnancy.  Severe blood loss.  Receiving donated blood (transfusions).  Low red blood cell count (anemia).  Long-term kidney failure.  Some unusual forms (variants) of hemoglobin. Talk  with your health care provider about what your results mean. Questions to ask your health care provider Ask your health care provider, or the department that is doing the test:  When will my results be ready?  How will I get my results?  What are my treatment options?  What other tests do I need?  What are my next steps? Summary  The hemoglobin A1c test (HbA1c test) may be done to evaluate your risk for developing diabetes, to diagnose diabetes, and to monitor long-term control of blood sugar (glucose) in people who have diabetes and help make treatment decisions.  Hemoglobin is a type of protein in the blood that carries oxygen. Glucose attaches to hemoglobin to form glycated hemoglobin. This test checks the amount of glycated hemoglobin in your blood, which is a good indicator of the average amount of glucose in your blood during  the past 2-3 months.  Talk with your health care provider about what your results mean. This information is not intended to replace advice given to you by your health care provider. Make sure you discuss any questions you have with your health care provider. Document Released: 05/15/2004 Document Revised: 04/05/2017 Document Reviewed: 12/04/2016 Elsevier Patient Education  2020 [email or web address] online care: [email or web address] Address: [place], [place] [address] Hours:  Closed ? Opens 8:30AM Tue Phone: [phone]

## 2019-04-20 NOTE — Progress Notes (Addendum)
Inpatient Diabetes Program Recommendations  AACE/ADA: New Consensus Statement on Inpatient Glycemic Control (2015)  Target Ranges:  Prepandial:   less than 140 mg/dL      Peak postprandial:   less than 180 mg/dL (1-2 hours)      Critically ill patients:  140 - 180 mg/dL   Lab Results  Component Value Date   GLUCAP 242 (H) 04/20/2019   HGBA1C 10.6 (H) 04/19/2019    Review of Glycemic Control Results for Sheila Austin, Sheila Austin (MRN [id number]) as of 04/20/2019 09:37  Ref. Range 04/19/2019 16:45 04/19/2019 20:42 04/19/2019 21:29 04/20/2019 06:18  Glucose-Capillary Latest Ref Range: 70 - 99 mg/dL 403 (H) 367 (H) 262 (H) 242 (H)   Diabetes history: Type 2 DM Outpatient Diabetes medications: Lantus 25 units QD, Humalog 0-8 units TID (not taking) Current orders for Inpatient glycemic control: Novolog 0-15 units TID, Novolog 0-5 units QHS  Inpatient Diabetes Program Recommendations:    Consider restarting portion of patient's basal insulin: Lantus 18 units QD.   Will plan to see patient and evaluate plan for DC.    Addendum@1400 : Spoke with patient and fiance regarding outpatient diabetes management. Patient has not had insulin at home since losing insurance in October.  Reviewed patient's current A1c of 10.6%. Explained what a A1c is and what it measures. Also reviewed goal A1c with patient, importance of good glucose control @ home, and blood sugar goals. Reviewed patho of DM, need for insulin, impact of glucose and infections, role of pancreas, vascular changes and commorbidities.  Patient has a meter from previous hospitalization and supplies to check glucose. Encouraged to continue checking 2-3 times per day and reviewed when to call MD.  Case management consulted for Match and PCP follow up. Patient lives in [place] and transportation is an issue. Novolin 70/30 14 units BID would be the best plan for discharge. Information attached to DC summary and patient states she will be able  to afford after 30 days. MD notified of recommendations. Patient educated on Novolin 70/30, stressed importance of following up with PCP and when to call MD. Patient has no further questions at this time.   Thanks, Bronson Curb, MSN, RNC-OB Diabetes Coordinator [phone] ([phone])

## 2019-04-20 NOTE — Care Management (Signed)
Consult- Med assistance, uninsured, will follow for [facility] and copay assistance

## 2019-04-20 NOTE — Plan of Care (Signed)
Poc progressing.  

## 2019-04-20 NOTE — Discharge Summary (Signed)
Physician Discharge [place] [id number] DOB: [birth year] DOA: 04/19/2019  PCP: Maximiano Coss, NP  Admit date: 04/19/2019 Discharge date: 04/20/2019  Admitted From: home Disposition:  home  Recommendations for Outpatient Follow-up:  1. Follow up with PCP in 1-2 weeks 2. Please obtain BMP/CBC in one week 3. Please follow up on the following pending results:  Home Health:none  Equipment/Devices:none   Discharge Condition:stable  CODE STATUS:full  Diet recommendation: carb modified  Brief/Interim Summary: 33 year old female with a history of uncontrolled diabetes, medical noncompliance, GERD, BKA on 02/04/2019 by Dr. Sharol Given who presents with nausea, vomiting, chills.  There was concerns about sepsis and stump site cellulitis.  She was started on broad-spectrum antibiotics and admitted to the hospital.  Sepsis was ruled out.  Her white count has remained stable and had a procalcitonin that was less than 0.1.  She had no further episodes of nausea, vomiting.  Dr. Sharol Given was consulted on the patient who evaluated her stump and determined that her stump was actually noncellulitic and healing well.  As there were no other signs of infection, the patient was discharged home to follow-up with her primary care physician.  Additionally, the patient did have hyperglycemia and hypertension.  She was restarted on her home medications and her blood pressure and blood sugar normalized.  Discharge Diagnoses:  Active Problems:   HLD (hyperlipidemia)   BMI 45.0-49.9, adult (HCC)   DM (diabetes mellitus) (HCC)   Hyponatremia   Unilateral complete BKA, right, sequela (HCC)   GERD (gastroesophageal reflux disease)   Hypertensive urgency    Discharge Instructions  Discharge Instructions    Call MD for:  difficulty breathing, headache or visual disturbances   Complete by: As directed    Call MD for:  extreme fatigue   Complete by: As directed    Call MD for:  hives   Complete by: As  directed    Call MD for:  persistant dizziness or light-headedness   Complete by: As directed    Call MD for:  persistant nausea and vomiting   Complete by: As directed    Call MD for:  redness, tenderness, or signs of infection (pain, swelling, redness, odor or green/yellow discharge around incision site)   Complete by: As directed    Call MD for:  severe uncontrolled pain   Complete by: As directed    Call MD for:  temperature >100.4   Complete by: As directed    Diet - low sodium heart healthy   Complete by: As directed    Increase activity slowly   Complete by: As directed      Allergies as of 04/20/2019      Reactions   Bee Venom Anaphylaxis, Hives, Shortness Of Breath, Swelling   Lovenox [enoxaparin Sodium] Other (See Comments)   Hyperkalemia?   Penicillins Itching   Has patient had a PCN reaction causing immediate rash, facial/tongue/throat swelling, SOB or lightheadedness with hypotension: yes Has patient had a PCN reaction causing severe rash involving mucus membranes or skin necrosis: unknown Has patient had a PCN reaction that required hospitalization : yes Has patient had a PCN reaction occurring within the last 10 years: no If all of the above answers are "NO", then may proceed with Cephalosporin use.      Medication List    STOP taking these medications   acetaminophen 325 MG tablet Commonly known as: TYLENOL   alum & mag hydroxide-simeth 259-563-87 MG/5ML suspension Commonly known as: MAALOX/MYLANTA   blood glucose  meter kit and supplies   cefdinir 300 MG capsule Commonly known as: OMNICEF   docusate sodium 100 MG capsule Commonly known as: COLACE   feeding supplement (PRO-STAT SUGAR FREE 64) Liqd   folic acid 1 MG tablet Commonly known as: FOLVITE   insulin lispro 100 UNIT/ML KwikPen Commonly known as: HumaLOG KwikPen   Insulin Pen Needle 31G X 6 MM Misc   Lantus SoloStar 100 UNIT/ML Solostar Pen Generic drug: Insulin Glargine    methocarbamol 500 MG tablet Commonly known as: Robaxin   oxyCODONE 5 MG immediate release tablet Commonly known as: Oxy IR/ROXICODONE   pantoprazole 40 MG tablet Commonly known as: PROTONIX   traMADol 50 MG tablet Commonly known as: ULTRAM     TAKE these medications   gabapentin 100 MG capsule Commonly known as: NEURONTIN Take 1 capsule (100 mg total) by mouth 2 (two) times daily. What changed:   when to take this  reasons to take this   ibuprofen 200 MG tablet Commonly known as: ADVIL Take 400 mg by mouth every 6 (six) hours as needed (pain).   lisinopril 2.5 MG tablet Commonly known as: ZESTRIL Take 1 tablet (2.5 mg total) by mouth daily.   NovoLIN 70/30 FlexPen (70-30) 100 UNIT/ML PEN Generic drug: Insulin Isophane & Regular Human Inject 14 Units into the skin 2 (two) times daily.      Follow-up Information    Maximiano Coss, NP Follow up in 2 week(s).   Specialty: Adult Health Nurse Practitioner Contact information: [place] [address] [phone]          Allergies  Allergen Reactions  . Bee Venom Anaphylaxis, Hives, Shortness Of Breath and Swelling  . Lovenox [Enoxaparin Sodium] Other (See Comments)    Hyperkalemia?  . Penicillins Itching    Has patient had a PCN reaction causing immediate rash, facial/tongue/throat swelling, SOB or lightheadedness with hypotension: yes Has patient had a PCN reaction causing severe rash involving mucus membranes or skin necrosis: unknown Has patient had a PCN reaction that required hospitalization : yes Has patient had a PCN reaction occurring within the last 10 years: no If all of the above answers are "NO", then may proceed with Cephalosporin use.     Consultations:  Ortho   Procedures/Studies: DG Chest 1 View  Result Date: 04/19/2019 CLINICAL DATA:  Fever and chills EXAM: CHEST  1 VIEW COMPARISON:  02/01/2019 FINDINGS: The heart size and mediastinal contours are within normal limits.  Both lungs are clear. The visualized skeletal structures are unremarkable. IMPRESSION: No active disease. Electronically Signed   By: Inez Catalina M.D.   On: 04/19/2019 16:06   DG Knee Complete 4 Views Right  Result Date: 04/19/2019 CLINICAL DATA:  Known right BKA with fever and increased purulence EXAM: RIGHT KNEE - COMPLETE 4+ VIEW COMPARISON:  None. FINDINGS: Changes of below the knee amputation are seen. Diffuse soft tissue edema is noted. No definitive bony erosive changes are noted to suggest osteomyelitis. The joint appears within normal limits. IMPRESSION: Changes consistent with BKA and soft tissue swelling. No osteomyelitis changes are seen. Electronically Signed   By: Inez Catalina M.D.   On: 04/19/2019 16:04       Subjective:   Discharge Exam: Vitals:   04/20/19 0928 04/20/19 1300  BP: (!) 127/91   Pulse: (!) 110   Resp: 13 (!) 22  Temp:    SpO2:     Vitals:   04/19/19 2311 04/20/19 0541 04/20/19 0928 04/20/19 1300  BP: Marland Kitchen)  137/92 127/82 (!) 127/91   Pulse: (!) 108 (!) 103 (!) 110   Resp: _0 (!) 22  Temp: 98.3 F (36.8 C) 98.5 F (36.9 C)    TempSrc: Oral Oral    SpO2: 99% 99%    Weight:  101.5 kg    Height: _1  (1.575 m)        General: Pt is alert, awake, not in acute distress Cardiovascular: RRR, S1/S2 +, no rubs, no gallops Respiratory: CTA bilaterally, no wheezing, no rhonchi Abdominal: Soft, NT, ND, bowel sounds + Extremities: no edema, no cyanosis.  Stump site healing with no drainage.    The results of significant diagnostics from this hospitalization (including imaging, microbiology, ancillary and laboratory) are listed below for reference.     Microbiology: Recent Results (from the past 240 hour(s))  SARS CORONAVIRUS 2 (TAT 6-24 HRS) Nasopharyngeal Nasopharyngeal Swab     Status: None   Collection Time: 04/19/19  8:54 PM   Specimen: Nasopharyngeal Swab  Result Value Ref Range Status   SARS Coronavirus 2 NEGATIVE NEGATIVE Final     Comment: (NOTE) SARS-CoV-2 target nucleic acids are NOT DETECTED. The SARS-CoV-2 RNA is generally detectable in upper and lower respiratory specimens during the acute phase of infection. Negative results do not preclude SARS-CoV-2 infection, do not rule out co-infections with other pathogens, and should not be used as the sole basis for treatment or other patient management decisions. Negative results must be combined with clinical observations, patient history, and epidemiological information. The expected result is Negative. Fact Sheet for Patients: SugarRoll.be Fact Sheet for Healthcare Providers: https://www.woods-mathews.com/ This test is not yet approved or cleared by the [place] FDA and  has been authorized for detection and/or diagnosis of SARS-CoV-2 by FDA under an Emergency Use Authorization (EUA). This EUA will remain  in effect (meaning this test can be used) for the duration of the COVID-19 declaration under Section 56 4(b)(1) of the Act, 21 U.S.C. section 360bbb-3(b)(1), unless the authorization is terminated or revoked sooner. Performed at [facility], [place] [address], [place], [place] [address]      Labs: BNP (last 3 results) No results for input(s): BNP in the last 8760 hours. Basic Metabolic Panel: Recent Labs  Lab 04/19/19 1313 04/19/19 2333 04/20/19 0225  NA 132*  --  138  K 4.6  --  4.0  CL 96*  --  105  CO2 26  --  23  GLUCOSE 424*  --  257*  BUN 24*  --  23*  CREATININE 1.04*  --  1.13*  CALCIUM 8.6*  --  7.9*  MG  --  1.7  --   PHOS  --  3.9  --    Liver Function Tests: Recent Labs  Lab 04/19/19 1313 04/20/19 0225  AST 11* 14*  ALT 16 12  ALKPHOS 101 78  BILITOT 0.8 0.6  PROT 7.3 6.0*  ALBUMIN 2.6* 2.2*   No results for input(s): LIPASE, AMYLASE in the last 168 hours. No results for input(s): AMMONIA in the last 168 hours. CBC: Recent Labs  Lab 04/19/19 1313 04/20/19 0225  WBC  13.1* 13.1*  NEUTROABS 10.0*  --   HGB 12.6 10.7*  HCT 37.7 32.1*  MCV 83.0 82.7  PLT 410* 437*   Cardiac Enzymes: No results for input(s): CKTOTAL, CKMB, CKMBINDEX, TROPONINI in the last 168 hours. BNP: Invalid input(s): POCBNP CBG: Recent Labs  Lab 04/19/19 1645 04/19/19 2042 04/19/19 2129 04/20/19 0618 04/20/19 1118  GLUCAP 403* 367* 262* 242* 285*   D-Dimer No results for input(s): DDIMER in the last 72 hours. Hgb A1c Recent Labs    04/19/19 2333  HGBA1C 10.6*   Lipid Profile No results for input(s): CHOL, HDL, LDLCALC, TRIG, CHOLHDL, LDLDIRECT in the last 72 hours. Thyroid function studies No results for input(s): TSH, T4TOTAL, T3FREE, THYROIDAB in the last 72 hours.  Invalid input(s): FREET3 Anemia work up No results for input(s): VITAMINB12, FOLATE, FERRITIN, TIBC, IRON, RETICCTPCT in the last 72 hours. Urinalysis    Component Value Date/Time   COLORURINE YELLOW 04/19/2019 2337   APPEARANCEUR CLEAR 04/19/2019 2337   LABSPEC 1.021 04/19/2019 2337   PHURINE 8.0 04/19/2019 2337   GLUCOSEU >=500 (A) 04/19/2019 2337   HGBUR MODERATE (A) 04/19/2019 2337   BILIRUBINUR NEGATIVE 04/19/2019 2337   BILIRUBINUR negative 08/13/2017 1622   KETONESUR 5 (A) 04/19/2019 2337   PROTEINUR >=300 (A) 04/19/2019 2337   UROBILINOGEN 0.2 08/13/2017 1622   UROBILINOGEN 0.2 11/25/2014 0553   NITRITE NEGATIVE 04/19/2019 2337   LEUKOCYTESUR NEGATIVE 04/19/2019 2337   Sepsis Labs Invalid input(s): PROCALCITONIN,  WBC,  LACTICIDVEN Microbiology Recent Results (from the past 240 hour(s))  SARS CORONAVIRUS 2 (TAT 6-24 HRS) Nasopharyngeal Nasopharyngeal Swab     Status: None   Collection Time: 04/19/19  8:54 PM   Specimen: Nasopharyngeal Swab  Result Value Ref Range Status   SARS Coronavirus 2 NEGATIVE NEGATIVE Final    Comment: (NOTE) SARS-CoV-2 target nucleic acids are NOT DETECTED. The SARS-CoV-2 RNA is generally detectable in upper and lower respiratory specimens during  the acute phase of infection. Negative results do not preclude SARS-CoV-2 infection, do not rule out co-infections with other pathogens, and should not be used as the sole basis for treatment or other patient management decisions. Negative results must be combined with clinical observations, patient history, and epidemiological information. The expected result is Negative. Fact Sheet for Patients: SugarRoll.be Fact Sheet for Healthcare Providers: https://www.woods-mathews.com/ This test is not yet approved or cleared by the [place] FDA and  has been authorized for detection and/or diagnosis of SARS-CoV-2 by FDA under an Emergency Use Authorization (EUA). This EUA will remain  in effect (meaning this test can be used) for the duration of the COVID-19 declaration under Section 56 4(b)(1) of the Act, 21 U.S.C. section 360bbb-3(b)(1), unless the authorization is terminated or revoked sooner. Performed at [facility], [place] [address], [place], [place] [address]      Time coordinating discharge: Over 30 minutes  SIGNED:   Truett Mainland, DO [facility] 04/20/2019, 2:54 PM Pager   If 7PM-7AM, please contact night-coverage [email or web address] Password [email or web address]

## 2019-04-21 ENCOUNTER — Inpatient Hospital Stay: Payer: Self-pay | Admitting: Registered Nurse

## 2019-04-21 LAB — URINE CULTURE: Culture: <10000 — AB

## 2019-04-22 NOTE — Telephone Encounter (Signed)
FYI, Dr. Sharol Given was able to access the patient at the hospital on 04/20/19.

## 2019-05-16 ENCOUNTER — Other Ambulatory Visit: Payer: Self-pay

## 2019-05-16 ENCOUNTER — Emergency Department (HOSPITAL_COMMUNITY)
Admission: EM | Admit: 2019-05-16 | Discharge: 2019-05-16 | DRG: 637 | Disposition: A | Discharge status: Home / Self Care | Payer: Medicaid Other | Attending: Family Medicine | Admitting: Family Medicine

## 2019-05-16 ENCOUNTER — Emergency Department (HOSPITAL_COMMUNITY): Payer: Medicaid Other

## 2019-05-16 ENCOUNTER — Encounter (HOSPITAL_COMMUNITY): Payer: Self-pay | Admitting: Emergency Medicine

## 2019-05-16 DIAGNOSIS — K3184 Gastroparesis: Secondary | ICD-10-CM | POA: Diagnosis present

## 2019-05-16 DIAGNOSIS — L03032 Cellulitis of left toe: Secondary | ICD-10-CM | POA: Diagnosis present

## 2019-05-16 DIAGNOSIS — E11621 Type 2 diabetes mellitus with foot ulcer: Secondary | ICD-10-CM | POA: Diagnosis not present

## 2019-05-16 DIAGNOSIS — E1143 Type 2 diabetes mellitus with diabetic autonomic (poly)neuropathy: Secondary | ICD-10-CM | POA: Diagnosis not present

## 2019-05-16 DIAGNOSIS — Z794 Long term (current) use of insulin: Secondary | ICD-10-CM

## 2019-05-16 DIAGNOSIS — Z6841 Body Mass Index (BMI) 40.0 and over, adult: Secondary | ICD-10-CM

## 2019-05-16 DIAGNOSIS — Z79899 Other long term (current) drug therapy: Secondary | ICD-10-CM

## 2019-05-16 DIAGNOSIS — E870 Hyperosmolality and hypernatremia: Secondary | ICD-10-CM | POA: Diagnosis present

## 2019-05-16 DIAGNOSIS — G4733 Obstructive sleep apnea (adult) (pediatric): Secondary | ICD-10-CM | POA: Diagnosis not present

## 2019-05-16 DIAGNOSIS — Z89611 Acquired absence of right leg above knee: Secondary | ICD-10-CM

## 2019-05-16 DIAGNOSIS — L03119 Cellulitis of unspecified part of limb: Secondary | ICD-10-CM

## 2019-05-16 DIAGNOSIS — K760 Fatty (change of) liver, not elsewhere classified: Secondary | ICD-10-CM | POA: Diagnosis present

## 2019-05-16 DIAGNOSIS — E11628 Type 2 diabetes mellitus with other skin complications: Secondary | ICD-10-CM | POA: Diagnosis present

## 2019-05-16 DIAGNOSIS — Z89511 Acquired absence of right leg below knee: Secondary | ICD-10-CM | POA: Diagnosis not present

## 2019-05-16 DIAGNOSIS — N179 Acute kidney failure, unspecified: Secondary | ICD-10-CM | POA: Diagnosis not present

## 2019-05-16 DIAGNOSIS — I10 Essential (primary) hypertension: Secondary | ICD-10-CM | POA: Diagnosis not present

## 2019-05-16 DIAGNOSIS — D72829 Elevated white blood cell count, unspecified: Secondary | ICD-10-CM

## 2019-05-16 DIAGNOSIS — K219 Gastro-esophageal reflux disease without esophagitis: Secondary | ICD-10-CM | POA: Diagnosis not present

## 2019-05-16 DIAGNOSIS — E1100 Type 2 diabetes mellitus with hyperosmolarity without nonketotic hyperglycemic-hyperosmolar coma (NKHHC): Secondary | ICD-10-CM | POA: Diagnosis present

## 2019-05-16 DIAGNOSIS — Z833 Family history of diabetes mellitus: Secondary | ICD-10-CM

## 2019-05-16 DIAGNOSIS — Z91030 Bee allergy status: Secondary | ICD-10-CM

## 2019-05-16 DIAGNOSIS — Z880 Allergy status to penicillin: Secondary | ICD-10-CM | POA: Diagnosis not present

## 2019-05-16 DIAGNOSIS — Z9114 Patient's other noncompliance with medication regimen: Secondary | ICD-10-CM | POA: Diagnosis not present

## 2019-05-16 DIAGNOSIS — L97529 Non-pressure chronic ulcer of other part of left foot with unspecified severity: Secondary | ICD-10-CM | POA: Diagnosis present

## 2019-05-16 DIAGNOSIS — R1111 Vomiting without nausea: Secondary | ICD-10-CM

## 2019-05-16 DIAGNOSIS — Z91120 Patient's intentional underdosing of medication regimen due to financial hardship: Secondary | ICD-10-CM

## 2019-05-16 DIAGNOSIS — Z888 Allergy status to other drugs, medicaments and biological substances status: Secondary | ICD-10-CM | POA: Diagnosis not present

## 2019-05-16 DIAGNOSIS — R739 Hyperglycemia, unspecified: Secondary | ICD-10-CM

## 2019-05-16 DIAGNOSIS — U071 COVID-19: Secondary | ICD-10-CM | POA: Diagnosis present

## 2019-05-16 DIAGNOSIS — E1165 Type 2 diabetes mellitus with hyperglycemia: Secondary | ICD-10-CM

## 2019-05-16 DIAGNOSIS — Z8249 Family history of ischemic heart disease and other diseases of the circulatory system: Secondary | ICD-10-CM

## 2019-05-16 LAB — I-STAT BETA HCG BLOOD, ED (MC, WL, AP ONLY): I-stat hCG, quantitative: <5 m[IU]/mL (ref <5)

## 2019-05-16 LAB — CBC
HCT: 36.1 % (ref 36.0–46.0)
HCT: 35.3 % — ABNORMAL LOW (ref 36.0–46.0)
Hemoglobin: 12.1 g/dL (ref 12.0–15.0)
Hemoglobin: 11.9 g/dL — ABNORMAL LOW (ref 12.0–15.0)
MCH: 28.2 pg (ref 26.0–34.0)
MCH: 28.1 pg (ref 26.0–34.0)
MCHC: 33.5 g/dL (ref 30.0–36.0)
MCHC: 33.7 g/dL (ref 30.0–36.0)
MCV: 84.1 fL (ref 80.0–100.0)
MCV: 83.5 fL (ref 80.0–100.0)
Platelets: 507 10*3/uL — ABNORMAL HIGH (ref 150–400)
Platelets: 501 10*3/uL — ABNORMAL HIGH (ref 150–400)
RBC: 4.29 MIL/uL (ref 3.87–5.11)
RBC: 4.23 MIL/uL (ref 3.87–5.11)
RDW: 14.6 % (ref 11.5–15.5)
RDW: 14.5 % (ref 11.5–15.5)
WBC: 17.4 10*3/uL — ABNORMAL HIGH (ref 4.0–10.5)
WBC: 18 10*3/uL — ABNORMAL HIGH (ref 4.0–10.5)
nRBC: 0 % (ref 0.0–0.2)
nRBC: 0 % (ref 0.0–0.2)

## 2019-05-16 LAB — COMPREHENSIVE METABOLIC PANEL
ALT: 13 U/L (ref 0–44)
AST: 17 U/L (ref 15–41)
Albumin: 2.6 g/dL — ABNORMAL LOW (ref 3.5–5.0)
Alkaline Phosphatase: 96 U/L (ref 38–126)
Anion gap: 16 — ABNORMAL HIGH (ref 5–15)
BUN: 24 mg/dL — ABNORMAL HIGH (ref 6–20)
CO2: 27 mmol/L (ref 22–32)
Calcium: 9 mg/dL (ref 8.9–10.3)
Chloride: 93 mmol/L — ABNORMAL LOW (ref 98–111)
Creatinine, Ser: 1.45 mg/dL — ABNORMAL HIGH (ref 0.44–1.00)
GFR calc Af Amer: 55 mL/min — ABNORMAL LOW (ref >60)
GFR calc non Af Amer: 48 mL/min — ABNORMAL LOW (ref >60)
Glucose, Bld: 594 mg/dL (ref 70–99)
Potassium: 4 mmol/L (ref 3.5–5.1)
Sodium: 136 mmol/L (ref 135–145)
Total Bilirubin: 1.4 mg/dL — ABNORMAL HIGH (ref 0.3–1.2)
Total Protein: 7.6 g/dL (ref 6.5–8.1)

## 2019-05-16 LAB — POCT I-STAT EG7
Acid-Base Excess: 5 mmol/L — ABNORMAL HIGH (ref 0.0–2.0)
Bicarbonate: 29.8 mmol/L — ABNORMAL HIGH (ref 20.0–28.0)
Calcium, Ion: 1.15 mmol/L (ref 1.15–1.40)
HCT: 29 % — ABNORMAL LOW (ref 36.0–46.0)
Hemoglobin: 9.9 g/dL — ABNORMAL LOW (ref 12.0–15.0)
O2 Saturation: 27 %
Potassium: 3.2 mmol/L — ABNORMAL LOW (ref 3.5–5.1)
Sodium: 140 mmol/L (ref 135–145)
TCO2: 31 mmol/L (ref 22–32)
pCO2, Ven: 43.2 mmHg — ABNORMAL LOW (ref 44.0–60.0)
pH, Ven: 7.446 — ABNORMAL HIGH (ref 7.250–7.430)
pO2, Ven: 18 mmHg — CL (ref 32.0–45.0)

## 2019-05-16 LAB — LACTIC ACID, PLASMA
Lactic Acid, Venous: 3.1 mmol/L (ref 0.5–1.9)
Lactic Acid, Venous: 3 mmol/L (ref 0.5–1.9)

## 2019-05-16 LAB — LIPASE, BLOOD: Lipase: 38 U/L (ref 11–51)

## 2019-05-16 LAB — BASIC METABOLIC PANEL
Anion gap: 18 — ABNORMAL HIGH (ref 5–15)
BUN: 28 mg/dL — ABNORMAL HIGH (ref 6–20)
CO2: 25 mmol/L (ref 22–32)
Calcium: 8.9 mg/dL (ref 8.9–10.3)
Chloride: 96 mmol/L — ABNORMAL LOW (ref 98–111)
Creatinine, Ser: 1.82 mg/dL — ABNORMAL HIGH (ref 0.44–1.00)
GFR calc Af Amer: 42 mL/min — ABNORMAL LOW (ref >60)
GFR calc non Af Amer: 36 mL/min — ABNORMAL LOW (ref >60)
Glucose, Bld: 401 mg/dL — ABNORMAL HIGH (ref 70–99)
Potassium: 3.3 mmol/L — ABNORMAL LOW (ref 3.5–5.1)
Sodium: 139 mmol/L (ref 135–145)

## 2019-05-16 LAB — CBG MONITORING, ED
Glucose-Capillary: 490 mg/dL — ABNORMAL HIGH (ref 70–99)
Glucose-Capillary: 529 mg/dL (ref 70–99)
Glucose-Capillary: 419 mg/dL — ABNORMAL HIGH (ref 70–99)

## 2019-05-16 LAB — DIFFERENTIAL
Abs Immature Granulocytes: 0.06 10*3/uL (ref 0.00–0.07)
Basophils Absolute: 0 10*3/uL (ref 0.0–0.1)
Basophils Relative: 0 %
Eosinophils Absolute: 0 10*3/uL (ref 0.0–0.5)
Eosinophils Relative: 0 %
Immature Granulocytes: 0 %
Lymphocytes Relative: 6 %
Lymphs Abs: 1.1 10*3/uL (ref 0.7–4.0)
Monocytes Absolute: 0.7 10*3/uL (ref 0.1–1.0)
Monocytes Relative: 4 %
Neutro Abs: 16.1 10*3/uL — ABNORMAL HIGH (ref 1.7–7.7)
Neutrophils Relative %: 90 %

## 2019-05-16 LAB — TROPONIN I (HIGH SENSITIVITY)
Troponin I (High Sensitivity): 10 ng/L (ref <18)
Troponin I (High Sensitivity): 13 ng/L (ref <18)

## 2019-05-16 LAB — OSMOLALITY: Osmolality: 322 mOsm/kg (ref 275–295)

## 2019-05-16 LAB — SARS CORONAVIRUS 2 (TAT 6-24 HRS): SARS Coronavirus 2: POSITIVE — AB

## 2019-05-16 MED ORDER — CLINDAMYCIN PHOSPHATE 600 MG/50ML IV SOLN: 600.0000 mg | Freq: Three times a day (TID) | INTRAVENOUS | Status: DC

## 2019-05-16 MED ORDER — PANTOPRAZOLE SODIUM 40 MG IV SOLR: 40.0000 mg | INTRAVENOUS | Status: DC

## 2019-05-16 MED ORDER — LABETALOL HCL 5 MG/ML IV SOLN: 10.0000 mg | INTRAVENOUS | Status: DC | PRN

## 2019-05-16 MED ORDER — POTASSIUM CHLORIDE 10 MEQ/100ML IV SOLN
10.0000 meq | INTRAVENOUS | Status: DC
  Administered 2019-05-16: 10 meq via INTRAVENOUS
  Filled 2019-05-16: qty 100

## 2019-05-16 MED ORDER — FENTANYL CITRATE (PF) 100 MCG/2ML IJ SOLN
50.0000 ug | Freq: Once | INTRAMUSCULAR | Status: AC
  Administered 2019-05-16: 19:00:00 50 ug via INTRAVENOUS
  Filled 2019-05-16: qty 2

## 2019-05-16 MED ORDER — DEXTROSE 50 % IV SOLN: 0.0000 mL | INTRAVENOUS | Status: DC | PRN

## 2019-05-16 MED ORDER — SODIUM CHLORIDE 0.9 % IV SOLN: INTRAVENOUS | Status: DC

## 2019-05-16 MED ORDER — DEXTROSE-NACL 5-0.45 % IV SOLN: INTRAVENOUS | Status: DC

## 2019-05-16 MED ORDER — INSULIN REGULAR(HUMAN) IN NACL 100-0.9 UT/100ML-% IV SOLN
INTRAVENOUS | Status: DC
  Administered 2019-05-16: 13 [IU]/h via INTRAVENOUS
  Filled 2019-05-16: qty 100

## 2019-05-16 MED ORDER — ONDANSETRON HCL 4 MG/2ML IJ SOLN
4.0000 mg | Freq: Four times a day (QID) | INTRAMUSCULAR | Status: DC | PRN
  Filled 2019-05-16: qty 2

## 2019-05-16 MED ORDER — ONDANSETRON HCL 4 MG/2ML IJ SOLN
4.0000 mg | Freq: Once | INTRAMUSCULAR | Status: AC
  Administered 2019-05-16: 4 mg via INTRAVENOUS
  Filled 2019-05-16: qty 2

## 2019-05-16 MED ORDER — LACTATED RINGERS IV BOLUS
2000.0000 mL | Freq: Once | INTRAVENOUS | Status: AC
  Administered 2019-05-16: 2000 mL via INTRAVENOUS

## 2019-05-16 MED ORDER — SODIUM CHLORIDE 0.9% FLUSH: 3.0000 mL | Freq: Once | INTRAVENOUS | Status: DC

## 2019-05-16 MED ORDER — PANTOPRAZOLE SODIUM 40 MG IV SOLR
40.0000 mg | Freq: Once | INTRAVENOUS | Status: AC
  Administered 2019-05-16: 40 mg via INTRAVENOUS
  Filled 2019-05-16: qty 40

## 2019-05-16 NOTE — ED Notes (Signed)
Pt stated desire to leave AMA, sent message via Amion to midlevel

## 2019-05-16 NOTE — ED Notes (Signed)
hospitalist at bedside

## 2019-05-16 NOTE — ED Triage Notes (Addendum)
C/o nausea and vomiting x 3 days.  C/o pain to center of chest since this morning.  States seen here 1 month ago for same symptoms without known cause.  Pt admitted for sepsis at that time.

## 2019-05-16 NOTE — ED Provider Notes (Signed)
[facility] EMERGENCY DEPARTMENT Provider Note   CSN: [id number] Arrival date & time: 05/16/19  1259     History Chief Complaint  Patient presents with  . Chest Pain  . Emesis    Sheila Austin is a 34 y.o. female.  Presents to the emergency department with a chief complaint of nausea, vomiting.  Symptoms ongoing for the last 3 or 4 days.  No blood in vomit, no green vomit, no associated abdominal pain, does have some abdominal discomfort while vomiting.  Today developed some chest pain in the center of her chest, described as burning sensation, worse while vomiting.  No difficulty in breathing, no fevers at home.  No known Covid contacts.  No cough.  No dysuria, hematuria.  Patient reports feels similar last time when she was in the hospital in December.  Chart review, patient admitted for possible sepsis in early December, started on broad-spectrum antibiotics, initial source thought to possibly be her BKA site.  However procalcitonin was normal, antibiotics were discontinued, BKA site was felt to be clean and not infected.  HPI     Past Medical History:  Diagnosis Date  . Diabetes mellitus    uncontrolled, last A1c was 14  . E. coli sepsis ([place])   . Ectopic pregnancy   . Essential hypertension, benign 11/01/2008  . Fatty liver   . Gastroparesis   . GERD (gastroesophageal reflux disease)   . Helicobacter pylori gastritis 09/07/2011  . Migraine   . Morbid obesity with body mass index (BMI) of 40.0 to 49.9 ([place])   . Pulmonary edema   . Pyelonephritis   . Ureteral obstruction     Patient Active Problem List   Diagnosis Date Noted  . GERD (gastroesophageal reflux disease)   . Hypertensive urgency   . Benign essential HTN   . Unilateral complete BKA, right, sequela ([place])   . Unilateral complete BKA, right, initial encounter ([place]) 02/09/2019  . Acquired absence of right leg below knee ([place])   . Hyponatremia   . Acute blood loss anemia   . Sinus  tachycardia   . Post-operative pain   . Bacteremia   . 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 Streptococcal bacteremia   . Klebsiella pneumoniae infection   . Abscess of leg, right   . Skin ulcer of right foot with necrosis of muscle ([place])   . Severe protein-calorie malnutrition ([place])   . DM (diabetes mellitus) ([place]) 02/01/2019  . Adjustment disorder 02/01/2019  . Obesity 12/31/2018  . Facial cellulitis 07/02/2018  . Class 3 severe obesity with body mass index (BMI) of 40.0 to 44.9 in adult ([place]) 07/09/2017  . Abscess of right breast 02/24/2016  . BMI 45.0-49.9, adult ([place]) 09/29/2015  . Skin lesions - right lower extremity 12/07/2013  . Morbid obesity with BMI of 50.0-59.9, adult ([place]) 10/05/2011  . Obstructive sleep apnea 10/05/2011  . HLD (hyperlipidemia) 09/27/2011  . Migraine 09/07/2011  . Essential hypertension, benign 11/01/2008  . Gastroparesis 09/27/2008  . IRREGULAR MENSES 08/24/2008  . Diabetes mellitus out of control ([place]) 07/04/2006    Past Surgical History:  Procedure Laterality Date  . AMPUTATION Right 02/04/2019   Procedure: RIGHT BELOW KNEE AMPUTATION;  Surgeon: Newt Minion, MD;  Location: [facility];  Service: Orthopedics;  Laterality: Right;  . ESOPHAGOGASTRODUODENOSCOPY  2010  . WOUND DEBRIDEMENT Right 01/03/2019   Procedure: DEBRIDEMENT WOUND RIGHT LATERAL FOOT;  Surgeon: Angelia Mould, MD;  Location: [facility];  Service: Vascular;  Laterality: Right;     OB History    Gravida  1   Para      Term      Preterm      AB  1   Living  0     SAB      TAB      Ectopic  1   Multiple      Live Births              Family History  Problem Relation Age of Onset  . Arthritis Mother   . Asthma Mother   . COPD Mother   . Depression Mother   . Diabetes Mother   . Hypertension Mother   . Mental illness Mother   . Kidney disease Mother   . Crohn's disease Mother   . Aneurysm Father 20       brain  . Mental illness Sister   .  Arthritis Maternal Aunt   . Asthma Maternal Aunt   . COPD Maternal Aunt   . Depression Maternal Aunt   . Diabetes Maternal Aunt   . Hypertension Maternal Aunt   . Mental illness Maternal Aunt   . Stroke Maternal Aunt   . Heart failure Maternal Aunt   . Heart failure Maternal Grandfather   . Cancer Maternal Grandfather   . Diabetes Maternal Grandfather   . Heart disease Maternal Grandfather   . Hypertension Maternal Grandfather   . Hyperlipidemia Maternal Grandfather   . Mental illness Brother   . Diabetes Maternal Grandmother   . Heart disease Maternal Grandmother   . Hypertension Maternal Grandmother   . Hyperlipidemia Maternal Grandmother   . Diabetes Paternal Grandmother   . Heart disease Paternal Grandmother   . Diabetes Paternal Grandfather   . Cancer Paternal Grandfather     Social History   Tobacco Use  . Smoking status: Never Smoker  . Smokeless tobacco: Never Used  Substance Use Topics  . Alcohol use: Not Currently  . Drug use: No    Home Medications Prior to Admission medications   Medication Sig Start Date End Date Taking? Authorizing Provider  gabapentin (NEURONTIN) 100 MG capsule Take 1 capsule (100 mg total) by mouth 2 (two) times daily. Patient taking differently: Take 100 mg by mouth 2 (two) times daily as needed (pain).  02/19/19   Love, Ivan Anchors, PA-C  ibuprofen (ADVIL) 200 MG tablet Take 400 mg by mouth every 6 (six) hours as needed (pain).    [provider]  Insulin Isophane & Regular Human (NOVOLIN 70/30 FLEXPEN) (70-30) 100 UNIT/ML PEN Inject 14 Units into the skin 2 (two) times daily. 04/20/19   Truett Mainland, DO  lisinopril (ZESTRIL) 2.5 MG tablet Take 1 tablet (2.5 mg total) by mouth daily. 04/20/19   Truett Mainland, DO    Allergies    Bee venom, Lovenox [enoxaparin sodium], and Penicillins  Review of Systems   Review of Systems  Constitutional: Negative for chills and fever.  HENT: Negative for ear pain and sore throat.     Eyes: Negative for pain and visual disturbance.  Respiratory: Negative for cough and shortness of breath.   Cardiovascular: Positive for chest pain. Negative for palpitations.  Gastrointestinal: Positive for nausea and vomiting. Negative for abdominal pain.  Genitourinary: Negative for dysuria and hematuria.  Musculoskeletal: Negative for arthralgias and back pain.  Skin: Negative for color change and rash.  Neurological: Negative for seizures and syncope.  All other systems reviewed and are negative.  Physical Exam Updated Vital Signs BP (!) 172/110 (BP Location: Right Arm)   Pulse (!) 124   Temp 98.8 F (37.1 C) (Oral)   Resp 18   LMP 05/16/2019   SpO2 100%   Physical Exam Vitals and nursing note reviewed.  Constitutional:      General: She is not in acute distress.    Appearance: She is well-developed.  HENT:     Head: Normocephalic and atraumatic.  Eyes:     Conjunctiva/sclera: Conjunctivae normal.  Cardiovascular:     Rate and Rhythm: Normal rate and regular rhythm.     Heart sounds: No murmur.  Pulmonary:     Effort: Pulmonary effort is normal. No respiratory distress.     Breath sounds: Normal breath sounds.  Abdominal:     Palpations: Abdomen is soft.     Tenderness: There is no abdominal tenderness.  Musculoskeletal:     Cervical back: Neck supple.     Right lower leg: No edema.     Left lower leg: No edema.  Skin:    General: Skin is warm and dry.     Capillary Refill: Capillary refill takes less than 2 seconds.  Neurological:     General: No focal deficit present.     Mental Status: She is alert and oriented to person, place, and time.     ED Results / Procedures / Treatments   Labs (all labs ordered are listed, but only abnormal results are displayed) Labs Reviewed  CBC - Abnormal; Notable for the following components:      Result Value   WBC 17.4 (*)    Platelets 507 (*)    All other components within normal limits  LACTIC ACID, PLASMA -  Abnormal; Notable for the following components:   Lactic Acid, Venous 3.1 (*)    All other components within normal limits  LIPASE, BLOOD  COMPREHENSIVE METABOLIC PANEL  URINALYSIS, ROUTINE W REFLEX MICROSCOPIC  LACTIC ACID, PLASMA  I-STAT BETA HCG BLOOD, ED ([facility], [facility], AP ONLY)  TROPONIN I (HIGH SENSITIVITY)    EKG EKG Interpretation  Date/Time:  Saturday May 16 2019 13:03:41 EST Ventricular Rate:  128 PR Interval:  122 QRS Duration: 80 QT Interval:  320 QTC Calculation: 467 R Axis:   72 Text Interpretation: Sinus tachycardia Anterior infarct , age undetermined Abnormal ECG No acute changes Confirmed by Madalyn Rob [phone]) on 05/16/2019 2:01:39 PM   Radiology DG Chest 2 View  Result Date: 05/16/2019 CLINICAL DATA:  Chest pain EXAM: CHEST - 2 VIEW COMPARISON:  April 19, 2019 FINDINGS: The lungs are clear. Heart size and pulmonary vascularity are normal. No adenopathy. No pneumothorax. No bone lesions. IMPRESSION: No abnormality noted. Electronically Signed   By: Lowella Grip III M.D.   On: 05/16/2019 13:54    Procedures Procedures (including critical care time)  Medications Ordered in ED Medications  sodium chloride flush (NS) 0.9 % injection 3 mL (has no administration in time range)    ED Course  I have reviewed the triage vital signs and the nursing notes.  Pertinent labs & imaging results that were available during my care of the patient were reviewed by me and considered in my medical decision making (see chart for details).  Clinical Course as of May 16 1515  Sat May 16, 2019  1430 Completed initial assessment, will check labs, UA, CXR, give fluid bolus, zofran/fentanyl and reassess   [RD]    Clinical Course User Index [RD] Lucrezia Starch, MD  MDM Rules/Calculators/A&P                     34 year old lady presents to ER with severe nausea, vomiting, now having some chest pains.  On exam patient is overall well-appearing, no distress, noted to  be somewhat tachycardic.  Initial labs were concerning for hyperglycemia, slight elevation in creatinine, elevation of lactic, leukocytosis.  Suspect findings related to frequent vomiting.  Her abdomen is soft, do not feel acute CT imaging needed at this time.  Will give aggressive symptomatic control, fluid bolus.  She has no fever, no obvious source for an infection, lower suspicion for sepsis. At time of signout, plan and disposition pending UA, repeat lactic, glucose, reassessment.  See Dr. Darleene Cleaver note for final plan.  Final Clinical Impression(s) / ED Diagnoses Final diagnoses:  Hyperglycemia  Leukocytosis, unspecified type  Vomiting without nausea, intractability of vomiting not specified, unspecified vomiting type    Rx / DC Orders ED Discharge Orders    None       Lucrezia Starch, MD 05/16/19 1526

## 2019-05-16 NOTE — ED Notes (Signed)
Attempted once again to convince pt to stay to no success. Pt leaving AMA

## 2019-05-16 NOTE — H&P (Signed)
History and Physical    Sheila Austin [id number] DOB: [birth year] DOA: 05/16/2019  PCP: Maximiano Coss, NP  Patient coming from: Home I have personally briefly reviewed patient's old medical records in [place]  Chief Complaint: nausea and vomiting   HPI: Sheila Austin is a 34 y.o. female with medical history significant of uncontrolled diabetes s/p right BKA, OSA, migraine, hypertension and non-compliance to medication due to financial constraints who presents with symptoms of nausea and vomiting.  Symptoms started 3 days ago. Patient reportedly had tried drugs for the first time because it was the anniversary of her mother's death. She OD on some unknown powder and was given narcan. Since then had noted nausea and vomiting. Vomitus has been brown and thick. Burning mid-sternal and upper epigastric pain. Felt chills but no fever.No cough or shortness. She is not on insulin due to lack of insurance and her sister has been trying to help her get medicaid.  She had ulcers and wound on toes of her left foot for the past week.   ED Course: She was afebrile, hypertensive up to 180s/100, tachycardic up to 130 on room air.  WBC of 17.4, platelet of 507. Glucose of 594. Creatinine of 1.45 from a prior of 1.13 three weeks ago. Anion of 16. Lactic of 3.  Troponin of 13.  COVID test pending.  CXR was negative.   Review of Systems: Constitutional: No Weight Change, No Fever ENT/Mouth: No sore throat, No Rhinorrhea Eyes: No Vision Changes Cardiovascular:+ Chest Pain, no SOB Respiratory: No Cough, No Sputum Gastrointestinal: + Nausea, + Vomiting, No Diarrhea, No Constipation, + Pain Genitourinary: no Urinary Incontinence Musculoskeletal: No Arthralgias, No Myalgias Skin: No Skin Lesions, No Pruritus, Neuro: no Weakness, No Numbness Psych: No Anxiety/Panic, No Depression, no decrease appetite Heme/Lymph: No Bruising, No Bleeding  Past Medical History:  Diagnosis Date  . Diabetes  mellitus    uncontrolled, last A1c was 14  . E. coli sepsis ([place])   . Ectopic pregnancy   . Essential hypertension, benign 11/01/2008  . Fatty liver   . Gastroparesis   . GERD (gastroesophageal reflux disease)   . Helicobacter pylori gastritis 09/07/2011  . Migraine   . Morbid obesity with body mass index (BMI) of 40.0 to 49.9 ([place])   . Pulmonary edema   . Pyelonephritis   . Ureteral obstruction     Past Surgical History:  Procedure Laterality Date  . AMPUTATION Right 02/04/2019   Procedure: RIGHT BELOW KNEE AMPUTATION;  Surgeon: Newt Minion, MD;  Location: [facility];  Service: Orthopedics;  Laterality: Right;  . ESOPHAGOGASTRODUODENOSCOPY  2010  . WOUND DEBRIDEMENT Right 01/03/2019   Procedure: DEBRIDEMENT WOUND RIGHT LATERAL FOOT;  Surgeon: Angelia Mould, MD;  Location: [facility];  Service: Vascular;  Laterality: Right;     reports that she has never smoked. She has never used smokeless tobacco. She reports previous alcohol use. She reports that she does not use drugs.  Allergies  Allergen Reactions  . Bee Venom Anaphylaxis, Hives, Shortness Of Breath and Swelling  . Lovenox [Enoxaparin Sodium] Other (See Comments)    Hyperkalemia?  . Penicillins Itching    Has patient had a PCN reaction causing immediate rash, facial/tongue/throat swelling, SOB or lightheadedness with hypotension: yes Has patient had a PCN reaction causing severe rash involving mucus membranes or skin necrosis: unknown Has patient had a PCN reaction that required hospitalization : yes Has patient had a PCN reaction occurring within the last 10 years: no  If all of the above answers are "NO", then may proceed with Cephalosporin use.     Family History  Problem Relation Age of Onset  . Arthritis Mother   . Asthma Mother   . COPD Mother   . Depression Mother   . Diabetes Mother   . Hypertension Mother   . Mental illness Mother   . Kidney disease Mother   . Crohn's disease Mother   . Aneurysm Father  48       brain  . Mental illness Sister   . Arthritis Maternal Aunt   . Asthma Maternal Aunt   . COPD Maternal Aunt   . Depression Maternal Aunt   . Diabetes Maternal Aunt   . Hypertension Maternal Aunt   . Mental illness Maternal Aunt   . Stroke Maternal Aunt   . Heart failure Maternal Aunt   . Heart failure Maternal Grandfather   . Cancer Maternal Grandfather   . Diabetes Maternal Grandfather   . Heart disease Maternal Grandfather   . Hypertension Maternal Grandfather   . Hyperlipidemia Maternal Grandfather   . Mental illness Brother   . Diabetes Maternal Grandmother   . Heart disease Maternal Grandmother   . Hypertension Maternal Grandmother   . Hyperlipidemia Maternal Grandmother   . Diabetes Paternal Grandmother   . Heart disease Paternal Grandmother   . Diabetes Paternal Grandfather   . Cancer Paternal Grandfather      Prior to Admission medications   Medication Sig Start Date End Date Taking? Authorizing Provider  acetaminophen (TYLENOL) 500 MG tablet Take 500 mg by mouth every 6 (six) hours as needed for headache (pain).   Yes [provider]  Bismuth Subsalicylate (PEPTO-BISMOL PO) Take 2 tablets by mouth daily as needed (upset stomach).   Yes [provider]  omeprazole (PRILOSEC OTC) 20 MG tablet Take 20 mg by mouth daily as needed (upset stomach).   Yes [provider]  gabapentin (NEURONTIN) 100 MG capsule Take 1 capsule (100 mg total) by mouth 2 (two) times daily. Patient not taking: Reported on 05/16/2019 02/19/19   Love, Ivan Anchors, PA-C  Insulin Isophane & Regular Human (NOVOLIN 70/30 FLEXPEN) (70-30) 100 UNIT/ML PEN Inject 14 Units into the skin 2 (two) times daily. Patient not taking: Reported on 05/16/2019 04/20/19   Truett Mainland, DO  lisinopril (ZESTRIL) 2.5 MG tablet Take 1 tablet (2.5 mg total) by mouth daily. Patient not taking: Reported on 05/16/2019 04/20/19   Truett Mainland, DO    Physical Exam: Vitals:   05/16/19 1617  05/16/19 1730 05/16/19 1800 05/16/19 1830  BP: (!) 182/99 (!) 186/93 (!) 178/118 (!) 183/100  Pulse: (!) 124 (!) 124 (!) 121 (!) 122  Resp: 18 19 (!) 21 12  Temp:      TempSrc:      SpO2: 100% 97% 100% 98%    Constitutional: NAD, calm, comfortable, non-toxic obese female sitting upright in bed Vitals:   05/16/19 1617 05/16/19 1730 05/16/19 1800 05/16/19 1830  BP: (!) 182/99 (!) 186/93 (!) 178/118 (!) 183/100  Pulse: (!) 124 (!) 124 (!) 121 (!) 122  Resp: 18 19 (!) 21 12  Temp:      TempSrc:      SpO2: 100% 97% 100% 98%   Eyes: PERRL, lids and conjunctivae normal ENMT: Mucous membranes are moist.  Neck: normal, supple Respiratory: clear to auscultation bilaterally, no wheezing, no crackles. Normal respiratory effort on room air. No accessory muscle use.  Cardiovascular: sinus tachycardia with  HR up to 130 on telemetry, no murmurs / rubs / gallops. No extremity edema.  Abdomen: mild epigastric tenderness, no masses palpated.  Bowel sounds positive.  Musculoskeletal: no clubbing / cyanosis. Right BKA.  Skin: erythematous and ulceration of the 4th digit and 3rd digit of the left foot  Neurologic: CN 2-12 grossly intact. Sensation intact. Strength 5/5 in all extremities.   Psychiatric: Normal judgment and insight. Alert and oriented x 3. Normal mood.     Labs on Admission: I have personally reviewed following labs and imaging studies  CBC: Recent Labs  Lab 05/16/19 1324  WBC 18.0*  17.4*  NEUTROABS 16.1*  HGB 11.9*  12.1  HCT 35.3*  36.1  MCV 83.5  84.1  PLT 501*  0000000*   Basic Metabolic Panel: Recent Labs  Lab 05/16/19 1324  NA 136  K 4.0  CL 93*  CO2 27  GLUCOSE 594*  BUN 24*  CREATININE 1.45*  CALCIUM 9.0   GFR: CrCl cannot be calculated (Unknown ideal weight.). Liver Function Tests: Recent Labs  Lab 05/16/19 1324  AST 17  ALT 13  ALKPHOS 96  BILITOT 1.4*  PROT 7.6  ALBUMIN 2.6*   Recent Labs  Lab 05/16/19 1324  LIPASE 38   No results for  input(s): AMMONIA in the last 168 hours. Coagulation Profile: No results for input(s): INR, PROTIME in the last 168 hours. Cardiac Enzymes: No results for input(s): CKTOTAL, CKMB, CKMBINDEX, TROPONINI in the last 168 hours. BNP (last 3 results) No results for input(s): PROBNP in the last 8760 hours. HbA1C: No results for input(s): HGBA1C in the last 72 hours. CBG: Recent Labs  Lab 05/16/19 1846 05/16/19 1920  GLUCAP 529* 490*   Lipid Profile: No results for input(s): CHOL, HDL, LDLCALC, TRIG, CHOLHDL, LDLDIRECT in the last 72 hours. Thyroid Function Tests: No results for input(s): TSH, T4TOTAL, FREET4, T3FREE, THYROIDAB in the last 72 hours. Anemia Panel: No results for input(s): VITAMINB12, FOLATE, FERRITIN, TIBC, IRON, RETICCTPCT in the last 72 hours. Urine analysis:    Component Value Date/Time   COLORURINE YELLOW 04/19/2019 2337   APPEARANCEUR CLEAR 04/19/2019 2337   LABSPEC 1.021 04/19/2019 2337   PHURINE 8.0 04/19/2019 2337   GLUCOSEU >=500 (A) 04/19/2019 2337   HGBUR MODERATE (A) 04/19/2019 2337   BILIRUBINUR NEGATIVE 04/19/2019 2337   BILIRUBINUR negative 08/13/2017 1622   KETONESUR 5 (A) 04/19/2019 2337   PROTEINUR >=300 (A) 04/19/2019 2337   UROBILINOGEN 0.2 08/13/2017 1622   UROBILINOGEN 0.2 11/25/2014 0553   NITRITE NEGATIVE 04/19/2019 2337   LEUKOCYTESUR NEGATIVE 04/19/2019 2337    Radiological Exams on Admission: DG Chest 2 View  Result Date: 05/16/2019 CLINICAL DATA:  Chest pain EXAM: CHEST - 2 VIEW COMPARISON:  April 19, 2019 FINDINGS: The lungs are clear. Heart size and pulmonary vascularity are normal. No adenopathy. No pneumothorax. No bone lesions. IMPRESSION: No abnormality noted. Electronically Signed   By: Lowella Grip III M.D.   On: 05/16/2019 13:54    EKG: Independently reviewed.  Assessment/Plan  Hyperglycemia- likely HHS secondary to cellulitis of the left toes and medication non-compliance Will obtain VBG  replete  potassium continue insulin gtt with goal of 140-180 and AG <12 IV NS until BG <250, then switch to D5 1/2 NS  BMP q4hr  keep NPO  Cellulitis of the toes of the left foot  IV Clindamycin- pt has penicillin allergy wound care per RN  Acute kidney injury  Monitor with fluids Avoid nephrotoxic agents  Hypertension labetolol 10mg   PRN 160/100 will restart ACE once AKI resolve  Right AKA No signs of infection  Morbid obesity  Complications comorbidies  Medication assistance Pt has no insurance Consult social work    DVT prophylaxis: SCD- due to allergies  Code Status:Full Family Communication: Plan discussed with patient at bedside  disposition Plan: Home with at least 2 midnight stays  Consults called:  Admission status: inpatient  Kanden Carey T Toddy Boyd DO [facility]   If 7PM-7AM, please contact night-coverage [email or web address] Password [facility]  05/16/2019, 7:25 PM

## 2019-05-16 NOTE — ED Notes (Signed)
Date and time results received: 05/16/19  Test: Lactic Acid Critical Value: 3.1  Name of Provider Notified: Roslynn Amble

## 2019-05-16 NOTE — ED Provider Notes (Signed)
6:10 PM Patient has persistent nausea, states that she feels poorly. She is persistently tachycardic, with ongoing lactic acidosis in spite of initial fluid resuscitation.  She will require admission for DKA.   Carmin Muskrat, MD 05/16/19 1810

## 2019-05-16 NOTE — ED Notes (Signed)
Mid level aware pt want to leave ama. Pt fully alert and oriented. Pt verbalized understanding of her illness and dangers of leaving against medical advice. Discussed with pt need for follow up and available community resources.

## 2019-05-17 ENCOUNTER — Other Ambulatory Visit: Payer: Self-pay

## 2019-05-17 ENCOUNTER — Emergency Department (HOSPITAL_COMMUNITY)
Admission: EM | Admit: 2019-05-17 | Discharge: 2019-05-18 | Disposition: A | Discharge status: Home / Self Care | Payer: Medicaid Other | Attending: Emergency Medicine | Admitting: Emergency Medicine

## 2019-05-17 ENCOUNTER — Encounter (HOSPITAL_COMMUNITY): Payer: Self-pay | Admitting: *Deleted

## 2019-05-17 DIAGNOSIS — I10 Essential (primary) hypertension: Secondary | ICD-10-CM | POA: Diagnosis not present

## 2019-05-17 DIAGNOSIS — E1165 Type 2 diabetes mellitus with hyperglycemia: Secondary | ICD-10-CM | POA: Insufficient documentation

## 2019-05-17 DIAGNOSIS — U071 COVID-19: Secondary | ICD-10-CM

## 2019-05-17 DIAGNOSIS — T407X1A Poisoning by cannabis (derivatives), accidental (unintentional), initial encounter: Secondary | ICD-10-CM | POA: Diagnosis not present

## 2019-05-17 DIAGNOSIS — R112 Nausea with vomiting, unspecified: Secondary | ICD-10-CM | POA: Diagnosis not present

## 2019-05-17 DIAGNOSIS — R739 Hyperglycemia, unspecified: Secondary | ICD-10-CM

## 2019-05-17 DIAGNOSIS — F1290 Cannabis use, unspecified, uncomplicated: Secondary | ICD-10-CM

## 2019-05-17 DIAGNOSIS — R1110 Vomiting, unspecified: Secondary | ICD-10-CM | POA: Diagnosis present

## 2019-05-17 DIAGNOSIS — Z79899 Other long term (current) drug therapy: Secondary | ICD-10-CM | POA: Insufficient documentation

## 2019-05-17 DIAGNOSIS — Z794 Long term (current) use of insulin: Secondary | ICD-10-CM | POA: Insufficient documentation

## 2019-05-17 LAB — CBG MONITORING, ED: Glucose-Capillary: 358 mg/dL — ABNORMAL HIGH (ref 70–99)

## 2019-05-17 MED ORDER — SODIUM CHLORIDE 0.9% FLUSH
3.0000 mL | Freq: Once | INTRAVENOUS | Status: AC
  Administered 2019-05-18: 3 mL via INTRAVENOUS

## 2019-05-17 NOTE — ED Triage Notes (Signed)
Pt arrives with c/o vomiting and weakness for 4 days. Reporting she was here yesterday and had to leave. On review of chart, pt covid was positive yesterday.

## 2019-05-18 ENCOUNTER — Encounter (HOSPITAL_COMMUNITY): Payer: Self-pay | Admitting: Emergency Medicine

## 2019-05-18 LAB — COMPREHENSIVE METABOLIC PANEL
ALT: 12 U/L (ref 0–44)
AST: 13 U/L — ABNORMAL LOW (ref 15–41)
Albumin: 2.7 g/dL — ABNORMAL LOW (ref 3.5–5.0)
Alkaline Phosphatase: 85 U/L (ref 38–126)
Anion gap: 14 (ref 5–15)
BUN: 34 mg/dL — ABNORMAL HIGH (ref 6–20)
CO2: 29 mmol/L (ref 22–32)
Calcium: 8.6 mg/dL — ABNORMAL LOW (ref 8.9–10.3)
Chloride: 90 mmol/L — ABNORMAL LOW (ref 98–111)
Creatinine, Ser: 1.37 mg/dL — ABNORMAL HIGH (ref 0.44–1.00)
GFR calc Af Amer: 59 mL/min — ABNORMAL LOW (ref >60)
GFR calc non Af Amer: 51 mL/min — ABNORMAL LOW (ref >60)
Glucose, Bld: 398 mg/dL — ABNORMAL HIGH (ref 70–99)
Potassium: 3.5 mmol/L (ref 3.5–5.1)
Sodium: 133 mmol/L — ABNORMAL LOW (ref 135–145)
Total Bilirubin: 0.8 mg/dL (ref 0.3–1.2)
Total Protein: 7.4 g/dL (ref 6.5–8.1)

## 2019-05-18 LAB — URINALYSIS, ROUTINE W REFLEX MICROSCOPIC
Bacteria, UA: NONE SEEN
Bilirubin Urine: NEGATIVE
Glucose, UA: >=500 mg/dL — AB
Ketones, ur: 20 mg/dL — AB
Leukocytes,Ua: NEGATIVE
Nitrite: NEGATIVE
Protein, ur: >=300 mg/dL — AB
RBC / HPF: >50 RBC/hpf — ABNORMAL HIGH (ref 0–5)
Specific Gravity, Urine: 1.028 (ref 1.005–1.030)
pH: 7 (ref 5.0–8.0)

## 2019-05-18 LAB — POCT I-STAT EG7
Acid-Base Excess: 8 mmol/L — ABNORMAL HIGH (ref 0.0–2.0)
Bicarbonate: 33.6 mmol/L — ABNORMAL HIGH (ref 20.0–28.0)
Calcium, Ion: 1.04 mmol/L — ABNORMAL LOW (ref 1.15–1.40)
HCT: 37 % (ref 36.0–46.0)
Hemoglobin: 12.6 g/dL (ref 12.0–15.0)
O2 Saturation: 99 %
Potassium: 3.4 mmol/L — ABNORMAL LOW (ref 3.5–5.1)
Sodium: 134 mmol/L — ABNORMAL LOW (ref 135–145)
TCO2: 35 mmol/L — ABNORMAL HIGH (ref 22–32)
pCO2, Ven: 48.7 mmHg (ref 44.0–60.0)
pH, Ven: 7.447 — ABNORMAL HIGH (ref 7.250–7.430)
pO2, Ven: 141 mmHg — ABNORMAL HIGH (ref 32.0–45.0)

## 2019-05-18 LAB — CBC
HCT: 36.6 % (ref 36.0–46.0)
Hemoglobin: 12.5 g/dL (ref 12.0–15.0)
MCH: 28.3 pg (ref 26.0–34.0)
MCHC: 34.2 g/dL (ref 30.0–36.0)
MCV: 83 fL (ref 80.0–100.0)
Platelets: 426 10*3/uL — ABNORMAL HIGH (ref 150–400)
RBC: 4.41 MIL/uL (ref 3.87–5.11)
RDW: 14.5 % (ref 11.5–15.5)
WBC: 13.5 10*3/uL — ABNORMAL HIGH (ref 4.0–10.5)
nRBC: 0 % (ref 0.0–0.2)

## 2019-05-18 LAB — RAPID URINE DRUG SCREEN, HOSP PERFORMED
Amphetamines: NOT DETECTED
Barbiturates: NOT DETECTED
Benzodiazepines: NOT DETECTED
Cocaine: NOT DETECTED
Opiates: NOT DETECTED
Tetrahydrocannabinol: POSITIVE — AB

## 2019-05-18 LAB — I-STAT BETA HCG BLOOD, ED (MC, WL, AP ONLY): I-stat hCG, quantitative: <5 m[IU]/mL (ref <5)

## 2019-05-18 LAB — LIPASE, BLOOD: Lipase: 33 U/L (ref 11–51)

## 2019-05-18 LAB — CBG MONITORING, ED: Glucose-Capillary: 382 mg/dL — ABNORMAL HIGH (ref 70–99)

## 2019-05-18 MED ORDER — ONDANSETRON 4 MG PO TBDP: ORAL_TABLET | ORAL | 0 refills | Status: DC

## 2019-05-18 MED ORDER — SODIUM CHLORIDE 0.9 % IV SOLN: INTRAVENOUS | Status: DC

## 2019-05-18 MED ORDER — ONDANSETRON HCL 4 MG/2ML IJ SOLN
4.0000 mg | Freq: Once | INTRAMUSCULAR | Status: AC
  Administered 2019-05-18: 4 mg via INTRAVENOUS
  Filled 2019-05-18: qty 2

## 2019-05-18 MED ORDER — HALOPERIDOL LACTATE 5 MG/ML IJ SOLN
5.0000 mg | Freq: Once | INTRAMUSCULAR | Status: AC
  Administered 2019-05-18: 5 mg via INTRAVENOUS
  Filled 2019-05-18: qty 1

## 2019-05-18 MED ORDER — HYDRALAZINE HCL 20 MG/ML IJ SOLN
5.0000 mg | Freq: Once | INTRAMUSCULAR | Status: AC
  Administered 2019-05-18: 5 mg via INTRAVENOUS
  Filled 2019-05-18: qty 1

## 2019-05-18 NOTE — ED Provider Notes (Signed)
[facility] EMERGENCY DEPARTMENT Provider Note   CSN: [id number] Arrival date & time: 05/17/19  2258     History Chief Complaint  Patient presents with  . Emesis    Sheila Austin is a 34 y.o. female.  The history is provided by the patient.  Emesis Severity:  Moderate Timing:  Intermittent Quality:  Stomach contents Able to tolerate:  Liquids Progression:  Unchanged Chronicity:  Recurrent Recent urination:  Decreased Context: not post-tussive   Relieved by:  Nothing Worsened by:  Nothing Ineffective treatments:  [place] tried Associated symptoms: cough and myalgias   Associated symptoms: no abdominal pain, no headaches and no sore throat   Risk factors: no travel to endemic areas        Past Medical History:  Diagnosis Date  . Diabetes mellitus    uncontrolled, last A1c was 14  . E. coli sepsis ([place])   . Ectopic pregnancy   . Essential hypertension, benign 11/01/2008  . Fatty liver   . Gastroparesis   . GERD (gastroesophageal reflux disease)   . Helicobacter pylori gastritis 09/07/2011  . Migraine   . Morbid obesity with body mass index (BMI) of 40.0 to 49.9 ([place])   . Pulmonary edema   . Pyelonephritis   . Ureteral obstruction     Patient Active Problem List   Diagnosis Date Noted  . Hyperosmolar hyperglycemic state (HHS) ([place]) 05/16/2019  . AKI (acute kidney injury) ([place]) 05/16/2019  . 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 GERD (gastroesophageal reflux disease)   . Hypertensive urgency   . Benign essential HTN   . Unilateral complete BKA, right, sequela ([place])   . Unilateral complete BKA, right, initial encounter ([place]) 02/09/2019  . Acquired absence of right leg below knee ([place])   . Hyponatremia   . Acute blood loss anemia   . Sinus tachycardia   . Post-operative pain   . Bacteremia   . 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 Streptococcal bacteremia   . Klebsiella pneumoniae infection   . Abscess of leg,  right   . Skin ulcer of right foot with necrosis of muscle ([place])   . Severe protein-calorie malnutrition ([place])   . DM (diabetes mellitus) ([place]) 02/01/2019  . Adjustment disorder 02/01/2019  . Cellulitis in diabetic foot ([place]) 12/31/2018  . Obesity 12/31/2018  . Facial cellulitis 07/02/2018  . Class 3 severe obesity with body mass index (BMI) of 40.0 to 44.9 in adult ([place]) 07/09/2017  . Abscess of right breast 02/24/2016  . BMI 45.0-49.9, adult ([place]) 09/29/2015  . Skin lesions - right lower extremity 12/07/2013  . Morbid obesity with BMI of 50.0-59.9, adult ([place]) 10/05/2011  . Obstructive sleep apnea 10/05/2011  . HLD (hyperlipidemia) 09/27/2011  . Migraine 09/07/2011  . Essential hypertension, benign 11/01/2008  . Gastroparesis 09/27/2008  . IRREGULAR MENSES 08/24/2008  . Diabetes mellitus out of control ([place]) 07/04/2006    Past Surgical History:  Procedure Laterality Date  . AMPUTATION Right 02/04/2019   Procedure: RIGHT BELOW KNEE AMPUTATION;  Surgeon: Newt Minion, MD;  Location: [place];  Service: Orthopedics;  Laterality: Right;  . ESOPHAGOGASTRODUODENOSCOPY  2010  . WOUND DEBRIDEMENT Right 01/03/2019   Procedure: DEBRIDEMENT WOUND RIGHT LATERAL FOOT;  Surgeon: Angelia Mould, MD;  Location[facility];  Service: Vascular;  Laterality: Right;     OB History    Gravida  1   Para      Term  Preterm      AB  1   Living  0     SAB      TAB      Ectopic  1   Multiple      Live Births              Family History  Problem Relation Age of Onset  . Arthritis Mother   . Asthma Mother   . COPD Mother   . Depression Mother   . Diabetes Mother   . Hypertension Mother   . Mental illness Mother   . Kidney disease Mother   . Crohn's disease Mother   . Aneurysm Father 50       brain  . Mental illness Sister   . Arthritis Maternal Aunt   . Asthma Maternal Aunt   . COPD Maternal Aunt   . Depression Maternal Aunt   . Diabetes Maternal Aunt   .  Hypertension Maternal Aunt   . Mental illness Maternal Aunt   . Stroke Maternal Aunt   . Heart failure Maternal Aunt   . Heart failure Maternal Grandfather   . Cancer Maternal Grandfather   . Diabetes Maternal Grandfather   . Heart disease Maternal Grandfather   . Hypertension Maternal Grandfather   . Hyperlipidemia Maternal Grandfather   . Mental illness Brother   . Diabetes Maternal Grandmother   . Heart disease Maternal Grandmother   . Hypertension Maternal Grandmother   . Hyperlipidemia Maternal Grandmother   . Diabetes Paternal Grandmother   . Heart disease Paternal Grandmother   . Diabetes Paternal Grandfather   . Cancer Paternal Grandfather     Social History   Tobacco Use  . Smoking status: Never Smoker  . Smokeless tobacco: Never Used  Substance Use Topics  . Alcohol use: Not Currently  . Drug use: No    Home Medications Prior to Admission medications   Medication Sig Start Date End Date Taking? Authorizing Provider  acetaminophen (TYLENOL) 500 MG tablet Take 500 mg by mouth every 6 (six) hours as needed for headache (pain).   Yes [provider]  omeprazole (PRILOSEC OTC) 20 MG tablet Take 20 mg by mouth daily as needed (upset stomach).   Yes [provider]  Bismuth Subsalicylate (PEPTO-BISMOL PO) Take 2 tablets by mouth daily as needed (upset stomach).    [provider]  gabapentin (NEURONTIN) 100 MG capsule Take 1 capsule (100 mg total) by mouth 2 (two) times daily. Patient not taking: Reported on 05/16/2019 02/19/19   Love, Ivan Anchors, PA-C  Insulin Isophane & Regular Human (NOVOLIN 70/30 FLEXPEN) (70-30) 100 UNIT/ML PEN Inject 14 Units into the skin 2 (two) times daily. Patient not taking: Reported on 05/16/2019 04/20/19   Truett Mainland, DO  lisinopril (ZESTRIL) 2.5 MG tablet Take 1 tablet (2.5 mg total) by mouth daily. Patient not taking: Reported on 05/16/2019 04/20/19   Truett Mainland, DO    Allergies    Bee venom, Lovenox  [enoxaparin sodium], and Penicillins  Review of Systems   Review of Systems  Constitutional: Negative for unexpected weight change.  HENT: Negative for sore throat.   Eyes: Negative for visual disturbance.  Respiratory: Positive for cough.   Cardiovascular: Negative for chest pain.  Gastrointestinal: Positive for vomiting. Negative for abdominal pain.  Genitourinary: Negative for difficulty urinating.  Musculoskeletal: Positive for myalgias.  Neurological: Negative for headaches.  Psychiatric/Behavioral: Negative for agitation.  All other systems reviewed and are negative.   Physical Exam  Updated Vital Signs BP (!) 202/117   Pulse (!) 117   Temp 98.2 F (36.8 C) (Oral)   Resp 20   Ht 5\' 2"  (1.575 m)   Wt 102.1 kg   LMP 05/16/2019   SpO2 99%   BMI 41.15 kg/m   Physical Exam Vitals and nursing note reviewed.  Constitutional:      Appearance: Normal appearance.  HENT:     Head: Normocephalic and atraumatic.     Nose: Nose normal.  Eyes:     Conjunctiva/sclera: Conjunctivae normal.     Pupils: Pupils are equal, round, and reactive to light.  Cardiovascular:     Rate and Rhythm: Regular rhythm. Tachycardia present.     Pulses: Normal pulses.     Heart sounds: Normal heart sounds.  Pulmonary:     Effort: Pulmonary effort is normal.     Breath sounds: Normal breath sounds.  Abdominal:     General: Abdomen is flat. Bowel sounds are normal.     Tenderness: There is no abdominal tenderness. There is no guarding or rebound.  Musculoskeletal:        General: Normal range of motion.     Cervical back: Normal range of motion and neck supple.  Skin:    General: Skin is warm and dry.     Capillary Refill: Capillary refill takes less than 2 seconds.  Neurological:     General: No focal deficit present.     Mental Status: She is alert and oriented to person, place, and time.     Deep Tendon Reflexes: Reflexes normal.  Psychiatric:        Mood and Affect: Mood normal.         Behavior: Behavior normal.     ED Results / Procedures / Treatments   Labs (all labs ordered are listed, but only abnormal results are displayed) Labs Reviewed  COMPREHENSIVE METABOLIC PANEL - Abnormal; Notable for the following components:      Result Value   Sodium 133 (*)    Chloride 90 (*)    Glucose, Bld 398 (*)    BUN 34 (*)    Creatinine, Ser 1.37 (*)    Calcium 8.6 (*)    Albumin 2.7 (*)    AST 13 (*)    GFR calc non Af Amer 51 (*)    GFR calc Af Amer 59 (*)    All other components within normal limits  CBC - Abnormal; Notable for the following components:   WBC 13.5 (*)    Platelets 426 (*)    All other components within normal limits  RAPID URINE DRUG SCREEN, HOSP PERFORMED - Abnormal; Notable for the following components:   Tetrahydrocannabinol POSITIVE (*)    All other components within normal limits  URINALYSIS, ROUTINE W REFLEX MICROSCOPIC - Abnormal; Notable for the following components:   APPearance HAZY (*)    Glucose, UA >=500 (*)    Hgb urine dipstick MODERATE (*)    Ketones, ur 20 (*)    Protein, ur >=300 (*)    RBC / HPF >50 (*)    All other components within normal limits  CBG MONITORING, ED - Abnormal; Notable for the following components:   Glucose-Capillary 358 (*)    All other components within normal limits  POCT I-STAT EG7 - Abnormal; Notable for the following components:   pH, Ven 7.447 (*)    pO2, Ven 141.0 (*)    Bicarbonate 33.6 (*)    TCO2 35 (*)  Acid-Base Excess 8.0 (*)    Sodium 134 (*)    Potassium 3.4 (*)    Calcium, Ion 1.04 (*)    All other components within normal limits  LIPASE, BLOOD  URINALYSIS, ROUTINE W REFLEX MICROSCOPIC  BASIC METABOLIC PANEL  BASIC METABOLIC PANEL  BASIC METABOLIC PANEL  BASIC METABOLIC PANEL  BASIC METABOLIC PANEL  BETA-HYDROXYBUTYRIC ACID  BETA-HYDROXYBUTYRIC ACID  BETA-HYDROXYBUTYRIC ACID  I-STAT BETA HCG BLOOD, ED ([facility], [facility], AP ONLY)  I-STAT BETA HCG BLOOD, ED ([facility], [facility], AP ONLY)  I-STAT  VENOUS BLOOD GAS, ED  CBG MONITORING, ED    EKG EKG Interpretation  Date/Time:  Monday May 18 2019 00:06:47 EST Ventricular Rate:  112 PR Interval:    QRS Duration: 76 QT Interval:  342 QTC Calculation: 467 R Axis:   80 Text Interpretation: Sinus tachycardia Confirmed by Dory Horn) on 05/18/2019 3:21:59 AM   Radiology DG Chest 2 View  Result Date: 05/16/2019 CLINICAL DATA:  Chest pain EXAM: CHEST - 2 VIEW COMPARISON:  April 19, 2019 FINDINGS: The lungs are clear. Heart size and pulmonary vascularity are normal. No adenopathy. No pneumothorax. No bone lesions. IMPRESSION: No abnormality noted. Electronically Signed   By: Lowella Grip III M.D.   On: 05/16/2019 13:54    Procedures Procedures (including critical care time)  Medications Ordered in ED Medications  0.9 %  sodium chloride infusion ( Intravenous New Bag/Given 05/18/19 0035)  hydrALAZINE (APRESOLINE) injection 5 mg (has no administration in time range)  sodium chloride flush (NS) 0.9 % injection 3 mL (3 mLs Intravenous Given 05/18/19 0036)  ondansetron (ZOFRAN) injection 4 mg (4 mg Intravenous Given 05/18/19 0035)  haloperidol lactate (HALDOL) injection 5 mg (5 mg Intravenous Given 05/18/19 0423)    ED Course  I have reviewed the triage vital signs and the nursing notes.  Pertinent labs & imaging results that were available during my care of the patient were reviewed by me and considered in my medical decision making (see chart for details).  Medication given for vomiting without further vomiting.  Po challenged.  Normal anion gap this is not DKA.  This is consistent with marijuana induced cyclic vomiting.  Patient is stable for discharge.  No hypoxia in the ED.  BP is improved patient will need to restart home medications.  Strict home isolation guidelines given verbally and on discharge paperwork.    Kirsten L Samuel was evaluated in Emergency Department on 05/18/2019 for the symptoms described in  the history of present illness. She was evaluated in the context of the global COVID-19 pandemic, which necessitated consideration that the patient might be at risk for infection with the SARS-CoV-2 virus that causes COVID-19. Institutional protocols and algorithms that pertain to the evaluation of patients at risk for COVID-19 are in a state of rapid change based on information released by regulatory bodies including the CDC and federal and state organizations. These policies and algorithms were followed during the patient's care in the ED. Final Clinical Impression(s) / ED Diagnoses COVID 19  intractable vomiting Hypertension   Return for intractable cough, coughing up blood,fevers >100.4 unrelieved by medication, shortness of breath, intractable vomiting, chest pain, shortness of breath, weakness,numbness, changes in speech, facial asymmetry,abdominal pain, passing out,Inability to tolerate liquids or food, cough, altered mental status or any concerns. No signs of systemic illness or infection. The patient is nontoxic-appearing on exam and vital signs are within normal limits.   I have reviewed the triage vital signs and the nursing notes.  Pertinent labs &imaging results that were available during my care of the patient were reviewed by me and considered in my medical decision making (see chart for details).  After history, exam, and medical workup I feel the patient has been appropriately medically screened and is safe for discharge home. Pertinent diagnoses were discussed with the patient. Patient was given return      Doyne Micke, MD 05/18/19 [id number]

## 2019-05-18 NOTE — ED Notes (Signed)
POC I-Stat beta and Venous blood gas already run but not clicked off

## 2019-05-18 NOTE — ED Notes (Signed)
Pt tolerating PO fluids

## 2019-05-18 NOTE — ED Notes (Signed)
Patient verbalizes understanding of discharge instructions. Opportunity for questioning and answers were provided. Armband removed by staff, pt discharged from ED. Wheeled out to lobby  

## 2019-05-18 NOTE — Discharge Instructions (Signed)
Sheila Austin was evaluated in Emergency Department on 05/18/2019 for the symptoms described in the history of present illness. She was evaluated in the context of the global COVID-19 pandemic, which necessitated consideration that the patient might be at risk for infection with the SARS-CoV-2 virus that causes COVID-19. Institutional protocols and algorithms that pertain to the evaluation of patients at risk for COVID-19 are in a state of rapid change based on information released by regulatory bodies including the CDC and federal and state organizations. These policies and algorithms were followed during the patient's care in the ED.

## 2019-08-23 ENCOUNTER — Encounter (HOSPITAL_COMMUNITY): Payer: Self-pay

## 2019-08-23 ENCOUNTER — Inpatient Hospital Stay (HOSPITAL_COMMUNITY)
Admission: EM | Admit: 2019-08-23 | Discharge: 2019-08-25 | DRG: 872 | Discharge status: Against Medical Advice | Payer: Medicaid Other | Attending: Internal Medicine | Admitting: Internal Medicine

## 2019-08-23 ENCOUNTER — Inpatient Hospital Stay (HOSPITAL_COMMUNITY): Payer: Medicaid Other

## 2019-08-23 ENCOUNTER — Emergency Department (HOSPITAL_COMMUNITY): Payer: Medicaid Other

## 2019-08-23 ENCOUNTER — Other Ambulatory Visit: Payer: Self-pay

## 2019-08-23 DIAGNOSIS — Z833 Family history of diabetes mellitus: Secondary | ICD-10-CM | POA: Diagnosis not present

## 2019-08-23 DIAGNOSIS — Z809 Family history of malignant neoplasm, unspecified: Secondary | ICD-10-CM

## 2019-08-23 DIAGNOSIS — Z89511 Acquired absence of right leg below knee: Secondary | ICD-10-CM | POA: Diagnosis not present

## 2019-08-23 DIAGNOSIS — L03115 Cellulitis of right lower limb: Secondary | ICD-10-CM | POA: Diagnosis present

## 2019-08-23 DIAGNOSIS — Z6841 Body Mass Index (BMI) 40.0 and over, adult: Secondary | ICD-10-CM

## 2019-08-23 DIAGNOSIS — Z83438 Family history of other disorder of lipoprotein metabolism and other lipidemia: Secondary | ICD-10-CM

## 2019-08-23 DIAGNOSIS — L0291 Cutaneous abscess, unspecified: Secondary | ICD-10-CM | POA: Diagnosis not present

## 2019-08-23 DIAGNOSIS — E878 Other disorders of electrolyte and fluid balance, not elsewhere classified: Secondary | ICD-10-CM | POA: Diagnosis present

## 2019-08-23 DIAGNOSIS — R112 Nausea with vomiting, unspecified: Secondary | ICD-10-CM | POA: Diagnosis not present

## 2019-08-23 DIAGNOSIS — Z8249 Family history of ischemic heart disease and other diseases of the circulatory system: Secondary | ICD-10-CM

## 2019-08-23 DIAGNOSIS — E871 Hypo-osmolality and hyponatremia: Secondary | ICD-10-CM | POA: Diagnosis present

## 2019-08-23 DIAGNOSIS — K760 Fatty (change of) liver, not elsewhere classified: Secondary | ICD-10-CM | POA: Diagnosis present

## 2019-08-23 DIAGNOSIS — Z5329 Procedure and treatment not carried out because of patient's decision for other reasons: Secondary | ICD-10-CM | POA: Diagnosis present

## 2019-08-23 DIAGNOSIS — I10 Essential (primary) hypertension: Secondary | ICD-10-CM | POA: Diagnosis present

## 2019-08-23 DIAGNOSIS — K3184 Gastroparesis: Secondary | ICD-10-CM | POA: Diagnosis present

## 2019-08-23 DIAGNOSIS — E785 Hyperlipidemia, unspecified: Secondary | ICD-10-CM | POA: Diagnosis present

## 2019-08-23 DIAGNOSIS — E1165 Type 2 diabetes mellitus with hyperglycemia: Secondary | ICD-10-CM | POA: Diagnosis present

## 2019-08-23 DIAGNOSIS — Z888 Allergy status to other drugs, medicaments and biological substances status: Secondary | ICD-10-CM

## 2019-08-23 DIAGNOSIS — E1110 Type 2 diabetes mellitus with ketoacidosis without coma: Secondary | ICD-10-CM | POA: Diagnosis not present

## 2019-08-23 DIAGNOSIS — I160 Hypertensive urgency: Secondary | ICD-10-CM | POA: Diagnosis present

## 2019-08-23 DIAGNOSIS — Z823 Family history of stroke: Secondary | ICD-10-CM

## 2019-08-23 DIAGNOSIS — Z8261 Family history of arthritis: Secondary | ICD-10-CM

## 2019-08-23 DIAGNOSIS — Z841 Family history of disorders of kidney and ureter: Secondary | ICD-10-CM

## 2019-08-23 DIAGNOSIS — Z825 Family history of asthma and other chronic lower respiratory diseases: Secondary | ICD-10-CM

## 2019-08-23 DIAGNOSIS — Z880 Allergy status to penicillin: Secondary | ICD-10-CM

## 2019-08-23 DIAGNOSIS — Z20822 Contact with and (suspected) exposure to covid-19: Secondary | ICD-10-CM | POA: Diagnosis present

## 2019-08-23 DIAGNOSIS — E872 Acidosis, unspecified: Secondary | ICD-10-CM | POA: Diagnosis present

## 2019-08-23 DIAGNOSIS — Z818 Family history of other mental and behavioral disorders: Secondary | ICD-10-CM

## 2019-08-23 DIAGNOSIS — G4733 Obstructive sleep apnea (adult) (pediatric): Secondary | ICD-10-CM | POA: Diagnosis present

## 2019-08-23 DIAGNOSIS — Z91030 Bee allergy status: Secondary | ICD-10-CM

## 2019-08-23 DIAGNOSIS — K2100 Gastro-esophageal reflux disease with esophagitis, without bleeding: Secondary | ICD-10-CM | POA: Diagnosis present

## 2019-08-23 DIAGNOSIS — A419 Sepsis, unspecified organism: Secondary | ICD-10-CM | POA: Diagnosis present

## 2019-08-23 DIAGNOSIS — E1143 Type 2 diabetes mellitus with diabetic autonomic (poly)neuropathy: Secondary | ICD-10-CM | POA: Diagnosis present

## 2019-08-23 DIAGNOSIS — L0390 Cellulitis, unspecified: Secondary | ICD-10-CM | POA: Diagnosis present

## 2019-08-23 DIAGNOSIS — N179 Acute kidney failure, unspecified: Secondary | ICD-10-CM | POA: Diagnosis present

## 2019-08-23 DIAGNOSIS — R1110 Vomiting, unspecified: Secondary | ICD-10-CM

## 2019-08-23 LAB — URINALYSIS, MICROSCOPIC (REFLEX)

## 2019-08-23 LAB — CBC WITH DIFFERENTIAL/PLATELET
Abs Immature Granulocytes: 0.14 10*3/uL — ABNORMAL HIGH (ref 0.00–0.07)
Basophils Absolute: 0 10*3/uL (ref 0.0–0.1)
Basophils Relative: 0 %
Eosinophils Absolute: 0 10*3/uL (ref 0.0–0.5)
Eosinophils Relative: 0 %
HCT: 33.1 % — ABNORMAL LOW (ref 36.0–46.0)
Hemoglobin: 11.1 g/dL — ABNORMAL LOW (ref 12.0–15.0)
Immature Granulocytes: 1 %
Lymphocytes Relative: 7 %
Lymphs Abs: 1.3 10*3/uL (ref 0.7–4.0)
MCH: 28.5 pg (ref 26.0–34.0)
MCHC: 33.5 g/dL (ref 30.0–36.0)
MCV: 85.1 fL (ref 80.0–100.0)
Monocytes Absolute: 0.8 10*3/uL (ref 0.1–1.0)
Monocytes Relative: 4 %
Neutro Abs: 17.5 10*3/uL — ABNORMAL HIGH (ref 1.7–7.7)
Neutrophils Relative %: 88 %
Platelets: 431 10*3/uL — ABNORMAL HIGH (ref 150–400)
RBC: 3.89 MIL/uL (ref 3.87–5.11)
RDW: 12.3 % (ref 11.5–15.5)
WBC: 19.7 10*3/uL — ABNORMAL HIGH (ref 4.0–10.5)
nRBC: 0 % (ref 0.0–0.2)

## 2019-08-23 LAB — URINALYSIS, ROUTINE W REFLEX MICROSCOPIC
Bilirubin Urine: NEGATIVE
Glucose, UA: >=500 mg/dL — AB
Ketones, ur: 15 mg/dL — AB
Leukocytes,Ua: NEGATIVE
Nitrite: NEGATIVE
Protein, ur: >300 mg/dL — AB
Specific Gravity, Urine: 1.015 (ref 1.005–1.030)
pH: 7 (ref 5.0–8.0)

## 2019-08-23 LAB — BLOOD GAS, VENOUS
Acid-Base Excess: 2.8 mmol/L — ABNORMAL HIGH (ref 0.0–2.0)
Bicarbonate: 27.2 mmol/L (ref 20.0–28.0)
Drawn by: 21285
O2 Saturation: 76.9 %
Patient temperature: 37
pCO2, Ven: 44.3 mmHg (ref 44.0–60.0)
pH, Ven: 7.405 (ref 7.250–7.430)
pO2, Ven: 43.3 mmHg (ref 32.0–45.0)

## 2019-08-23 LAB — COMPREHENSIVE METABOLIC PANEL
ALT: 14 U/L (ref 0–44)
AST: 15 U/L (ref 15–41)
Albumin: 2.5 g/dL — ABNORMAL LOW (ref 3.5–5.0)
Alkaline Phosphatase: 98 U/L (ref 38–126)
Anion gap: 15 (ref 5–15)
BUN: 27 mg/dL — ABNORMAL HIGH (ref 6–20)
CO2: 25 mmol/L (ref 22–32)
Calcium: 9 mg/dL (ref 8.9–10.3)
Chloride: 93 mmol/L — ABNORMAL LOW (ref 98–111)
Creatinine, Ser: 1.54 mg/dL — ABNORMAL HIGH (ref 0.44–1.00)
GFR calc Af Amer: 52 mL/min — ABNORMAL LOW (ref >60)
GFR calc non Af Amer: 45 mL/min — ABNORMAL LOW (ref >60)
Glucose, Bld: 433 mg/dL — ABNORMAL HIGH (ref 70–99)
Potassium: 3.7 mmol/L (ref 3.5–5.1)
Sodium: 133 mmol/L — ABNORMAL LOW (ref 135–145)
Total Bilirubin: 1 mg/dL (ref 0.3–1.2)
Total Protein: 7.5 g/dL (ref 6.5–8.1)

## 2019-08-23 LAB — HEMOGLOBIN A1C
Hgb A1c MFr Bld: 9.8 % — ABNORMAL HIGH (ref 4.8–5.6)
Mean Plasma Glucose: 234.56 mg/dL

## 2019-08-23 LAB — CBG MONITORING, ED
Glucose-Capillary: 423 mg/dL — ABNORMAL HIGH (ref 70–99)
Glucose-Capillary: 361 mg/dL — ABNORMAL HIGH (ref 70–99)
Glucose-Capillary: 434 mg/dL — ABNORMAL HIGH (ref 70–99)
Glucose-Capillary: 237 mg/dL — ABNORMAL HIGH (ref 70–99)

## 2019-08-23 LAB — PROTIME-INR
INR: 1.1 (ref 0.8–1.2)
Prothrombin Time: 13.7 seconds (ref 11.4–15.2)

## 2019-08-23 LAB — LACTIC ACID, PLASMA
Lactic Acid, Venous: 1.9 mmol/L (ref 0.5–1.9)
Lactic Acid, Venous: 2.3 mmol/L (ref 0.5–1.9)

## 2019-08-23 LAB — APTT: aPTT: 27 seconds (ref 24–36)

## 2019-08-23 MED ORDER — ENALAPRILAT 1.25 MG/ML IV SOLN
1.2500 mg | Freq: Four times a day (QID) | INTRAVENOUS | Status: DC | PRN
  Administered 2019-08-23 – 2019-08-24 (×2): 1.25 mg via INTRAVENOUS
  Filled 2019-08-23 (×2): qty 1

## 2019-08-23 MED ORDER — POTASSIUM CHLORIDE 10 MEQ/100ML IV SOLN
10.0000 meq | INTRAVENOUS | Status: AC
  Administered 2019-08-23 (×2): 10 meq via INTRAVENOUS
  Filled 2019-08-23 (×2): qty 100

## 2019-08-23 MED ORDER — DEXTROSE 50 % IV SOLN: 0.0000 mL | INTRAVENOUS | Status: DC | PRN

## 2019-08-23 MED ORDER — METOCLOPRAMIDE HCL 5 MG/ML IJ SOLN
10.0000 mg | Freq: Once | INTRAMUSCULAR | Status: AC
  Administered 2019-08-23: 10 mg via INTRAVENOUS
  Filled 2019-08-23: qty 2

## 2019-08-23 MED ORDER — METOCLOPRAMIDE HCL 5 MG/ML IJ SOLN
10.0000 mg | Freq: Four times a day (QID) | INTRAMUSCULAR | Status: DC | PRN
  Administered 2019-08-24: 10 mg via INTRAVENOUS
  Filled 2019-08-23: qty 2

## 2019-08-23 MED ORDER — SODIUM CHLORIDE 0.9% FLUSH: 3.0000 mL | Freq: Once | INTRAVENOUS | Status: DC

## 2019-08-23 MED ORDER — DEXTROSE-NACL 5-0.45 % IV SOLN: INTRAVENOUS | Status: DC

## 2019-08-23 MED ORDER — METRONIDAZOLE IN NACL 5-0.79 MG/ML-% IV SOLN
500.0000 mg | Freq: Three times a day (TID) | INTRAVENOUS | Status: DC
  Administered 2019-08-23 – 2019-08-25 (×6): 500 mg via INTRAVENOUS
  Filled 2019-08-23 (×6): qty 100

## 2019-08-23 MED ORDER — ONDANSETRON HCL 4 MG/2ML IJ SOLN
4.0000 mg | Freq: Once | INTRAMUSCULAR | Status: AC
  Administered 2019-08-23: 4 mg via INTRAVENOUS
  Filled 2019-08-23: qty 2

## 2019-08-23 MED ORDER — VANCOMYCIN HCL 1500 MG/300ML IV SOLN
1500.0000 mg | Freq: Once | INTRAVENOUS | Status: AC
  Administered 2019-08-23: 22:00:00 1500 mg via INTRAVENOUS
  Filled 2019-08-23: qty 300

## 2019-08-23 MED ORDER — CEFAZOLIN SODIUM-DEXTROSE 1-4 GM/50ML-% IV SOLN
1.0000 g | Freq: Once | INTRAVENOUS | Status: AC
  Administered 2019-08-23: 1 g via INTRAVENOUS
  Filled 2019-08-23: qty 50

## 2019-08-23 MED ORDER — ACETAMINOPHEN 650 MG RE SUPP: 650.0000 mg | Freq: Four times a day (QID) | RECTAL | Status: DC | PRN

## 2019-08-23 MED ORDER — SODIUM CHLORIDE 0.9 % IV BOLUS
1000.0000 mL | Freq: Once | INTRAVENOUS | Status: AC
  Administered 2019-08-23: 1000 mL via INTRAVENOUS

## 2019-08-23 MED ORDER — VANCOMYCIN HCL IN DEXTROSE 1-5 GM/200ML-% IV SOLN
1000.0000 mg | INTRAVENOUS | Status: DC
  Administered 2019-08-24: 1000 mg via INTRAVENOUS
  Filled 2019-08-23 (×2): qty 200

## 2019-08-23 MED ORDER — PROCHLORPERAZINE EDISYLATE 10 MG/2ML IJ SOLN: 10.0000 mg | Freq: Four times a day (QID) | INTRAMUSCULAR | Status: DC | PRN

## 2019-08-23 MED ORDER — ACETAMINOPHEN 325 MG PO TABS: 650.0000 mg | ORAL_TABLET | Freq: Four times a day (QID) | ORAL | Status: DC | PRN

## 2019-08-23 MED ORDER — PANTOPRAZOLE SODIUM 40 MG IV SOLR
40.0000 mg | INTRAVENOUS | Status: DC
  Administered 2019-08-23 – 2019-08-24 (×2): 40 mg via INTRAVENOUS
  Filled 2019-08-23 (×2): qty 40

## 2019-08-23 MED ORDER — SODIUM CHLORIDE 0.9 % IV SOLN
1.0000 g | INTRAVENOUS | Status: DC
  Administered 2019-08-23: 1 g via INTRAVENOUS
  Filled 2019-08-23 (×2): qty 10

## 2019-08-23 MED ORDER — ALUM & MAG HYDROXIDE-SIMETH 200-200-20 MG/5ML PO SUSP
30.0000 mL | Freq: Once | ORAL | Status: AC
  Administered 2019-08-23: 30 mL via ORAL
  Filled 2019-08-23: qty 30

## 2019-08-23 MED ORDER — LACTATED RINGERS IV SOLN: INTRAVENOUS | Status: DC

## 2019-08-23 MED ORDER — MORPHINE SULFATE (PF) 2 MG/ML IV SOLN: 2.0000 mg | INTRAVENOUS | Status: DC | PRN

## 2019-08-23 MED ORDER — INSULIN REGULAR(HUMAN) IN NACL 100-0.9 UT/100ML-% IV SOLN
INTRAVENOUS | Status: DC
  Administered 2019-08-23: 13 [IU]/h via INTRAVENOUS
  Filled 2019-08-23: qty 100

## 2019-08-23 MED ORDER — MORPHINE SULFATE (PF) 4 MG/ML IV SOLN
4.0000 mg | INTRAVENOUS | Status: DC | PRN
  Administered 2019-08-24: 4 mg via INTRAVENOUS
  Filled 2019-08-23: qty 1

## 2019-08-23 NOTE — ED Notes (Signed)
Pt arouses briefly to voice and returns to sleep.

## 2019-08-23 NOTE — ED Notes (Signed)
Back to room  from U/S 

## 2019-08-23 NOTE — ED Provider Notes (Addendum)
[facility] EMERGENCY DEPARTMENT Provider Note   CSN: [id number] Arrival date & time: 08/23/19  1436     History Chief Complaint  Patient presents with  . Wound Infection    Sheila Austin is a 34 y.o. female.  HPI 34 year old female with extensive medical history including out-of-control DM type II on no current treatment, right BKA, osteomyelitis, GERD, hypertensive urgency presents ER with a 2-week history of a developing right wound on her anterior right thigh.  Yesterday she developed severe nausea and vomiting, no measurable fevers but patient reports intermittent chills.  She has not been able to eat or drink anything in the last 48 hours.  She denies any abdominal pain, chest pain, shortness of breath, diarrhea, hematochezia, melena, dysuria, hematuria, vaginal pain, vaginal discharge.  Patient reports that she is not on any medications right now for her multiple medical problems as she only just a few weeks ago got Medicaid and has been planning to see her PCP.  Denies IV drug use, recent marijuana , alcohol use.  She does endorse recent cocaine use.  She does report that she had a gastric emptying study in the past which was abnormal.  Last bowel movement was 2 days ago, with no evidence of constipation..    Past Medical History:  Diagnosis Date  . Diabetes mellitus    uncontrolled, last A1c was 14  . E. coli sepsis ([place])   . Ectopic pregnancy   . Essential hypertension, benign 11/01/2008  . Fatty liver   . Gastroparesis   . GERD (gastroesophageal reflux disease)   . Helicobacter pylori gastritis 09/07/2011  . Migraine   . Morbid obesity with body mass index (BMI) of 40.0 to 49.9 ([place])   . Pulmonary edema   . Pyelonephritis   . Ureteral obstruction     Patient Active Problem List   Diagnosis Date Noted  . Hyperosmolar hyperglycemic state (HHS) ([place]) 05/16/2019  . AKI (acute kidney injury) ([place]) 05/16/2019  . S/P AKA (above knee amputation), right  ([place]) 05/16/2019  . GERD (gastroesophageal reflux disease)   . Hypertensive urgency   . Benign essential HTN   . Unilateral complete BKA, right, sequela ([place])   . Unilateral complete BKA, right, initial encounter ([place]) 02/09/2019  . Acquired absence of right leg below knee ([place])   . Hyponatremia   . Acute blood loss anemia   . Sinus tachycardia   . Post-operative pain   . Bacteremia   . 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[place])   . Streptococcal bacteremia   . Klebsiella pneumoniae infection   . Abscess of leg, right   . Skin ulcer of right foot with necrosis of muscle ([place])   . Severe protein-calorie malnutrition ([place])   . DM (diabetes mellitus) ([place]) 02/01/2019  . Adjustment disorder 02/01/2019  . Cellulitis in diabetic foot ([place]) 12/31/2018  . Obesity 12/31/2018  . Facial cellulitis 07/02/2018  . Class 3 severe obesity with body mass index (BMI) of 40.0 to 44.9 in adult ([place]) 07/09/2017  . Abscess of right breast 02/24/2016  . BMI 45.0-49.9, adult ([place]) 09/29/2015  . Skin lesions - right lower extremity 12/07/2013  . Morbid obesity with BMI of 50.0-59.9, adult ([place]) 10/05/2011  . Obstructive sleep apnea 10/05/2011  . HLD (hyperlipidemia) 09/27/2011  . Migraine 09/07/2011  . Essential hypertension, benign 11/01/2008  . Gastroparesis 09/27/2008  . IRREGULAR MENSES 08/24/2008  . Diabetes mellitus out of control ([place]) 07/04/2006    Past Surgical History:  Procedure  Laterality Date  . AMPUTATION Right 02/04/2019   Procedure: RIGHT BELOW KNEE AMPUTATION;  Surgeon: Newt Minion, MD;  Location: [facility];  Service: Orthopedics;  Laterality: Right;  . ESOPHAGOGASTRODUODENOSCOPY  2010  . WOUND DEBRIDEMENT Right 01/03/2019   Procedure: DEBRIDEMENT WOUND RIGHT LATERAL FOOT;  Surgeon: Angelia Mould, MD;  Location[facility];  Service: Vascular;  Laterality: Right;     OB History    Gravida  1   Para      Term      Preterm      AB  1   Living  0     SAB      TAB       Ectopic  1   Multiple      Live Births              Family History  Problem Relation Age of Onset  . Arthritis Mother   . Asthma Mother   . COPD Mother   . Depression Mother   . Diabetes Mother   . Hypertension Mother   . Mental illness Mother   . Kidney disease Mother   . Crohn's disease Mother   . Aneurysm Father 59       brain  . Mental illness Sister   . Arthritis Maternal Aunt   . Asthma Maternal Aunt   . COPD Maternal Aunt   . Depression Maternal Aunt   . Diabetes Maternal Aunt   . Hypertension Maternal Aunt   . Mental illness Maternal Aunt   . Stroke Maternal Aunt   . Heart failure Maternal Aunt   . Heart failure Maternal Grandfather   . Cancer Maternal Grandfather   . Diabetes Maternal Grandfather   . Heart disease Maternal Grandfather   . Hypertension Maternal Grandfather   . Hyperlipidemia Maternal Grandfather   . Mental illness Brother   . Diabetes Maternal Grandmother   . Heart disease Maternal Grandmother   . Hypertension Maternal Grandmother   . Hyperlipidemia Maternal Grandmother   . Diabetes Paternal Grandmother   . Heart disease Paternal Grandmother   . Diabetes Paternal Grandfather   . Cancer Paternal Grandfather     Social History   Tobacco Use  . Smoking status: Never Smoker  . Smokeless tobacco: Never Used  Substance Use Topics  . Alcohol use: Not Currently  . Drug use: No    Home Medications Prior to Admission medications   Medication Sig Start Date End Date Taking? Authorizing Provider  acetaminophen (TYLENOL) 500 MG tablet Take 500 mg by mouth every 6 (six) hours as needed for headache (pain).    [provider]  Bismuth Subsalicylate (PEPTO-BISMOL PO) Take 2 tablets by mouth daily as needed (upset stomach).    [provider]  gabapentin (NEURONTIN) 100 MG capsule Take 1 capsule (100 mg total) by mouth 2 (two) times daily. Patient not taking: Reported on 05/16/2019 02/19/19   Love, Ivan Anchors, PA-C  Insulin  Isophane & Regular Human (NOVOLIN 70/30 FLEXPEN) (70-30) 100 UNIT/ML PEN Inject 14 Units into the skin 2 (two) times daily. Patient not taking: Reported on 05/16/2019 04/20/19   Truett Mainland, DO  lisinopril (ZESTRIL) 2.5 MG tablet Take 1 tablet (2.5 mg total) by mouth daily. Patient not taking: Reported on 05/16/2019 04/20/19   Truett Mainland, DO  omeprazole (PRILOSEC OTC) 20 MG tablet Take 20 mg by mouth daily as needed (upset stomach).    [provider]  ondansetron (ZOFRAN ODT) 4 MG  disintegrating tablet 4mg  ODT q8 hours prn nausea/vomit 05/18/19   Palumbo, April, MD    Allergies    Bee venom, Lovenox [enoxaparin sodium], and Penicillins  Review of Systems   Review of Systems  Constitutional: Positive for chills and fatigue. Negative for fever.  HENT: Negative for ear pain and sore throat.   Eyes: Negative for pain and visual disturbance.  Respiratory: Negative for cough and shortness of breath.   Cardiovascular: Negative for chest pain and palpitations.  Gastrointestinal: Negative for abdominal pain and vomiting.  Genitourinary: Negative for dysuria, hematuria, pelvic pain, vaginal bleeding, vaginal discharge and vaginal pain.  Musculoskeletal: Negative for arthralgias, back pain, neck pain and neck stiffness.  Skin: Negative for color change and rash.  Neurological: Positive for dizziness, weakness and headaches. Negative for seizures and syncope.  Psychiatric/Behavioral: Negative for confusion.  All other systems reviewed and are negative.   Physical Exam Updated Vital Signs BP (!) 205/114 (BP Location: Right Arm)   Pulse (!) 120   Temp 99.5 F (37.5 C) (Rectal)   Resp (!) 25   Ht 5\' 2"  (1.575 m)   Wt 102.1 kg   SpO2 98%   BMI 41.15 kg/m   Physical Exam Vitals and nursing note reviewed.  Constitutional:      General: She is not in acute distress.    Appearance: She is well-developed.  HENT:     Head: Normocephalic and atraumatic.     Mouth/Throat:      Mouth: Mucous membranes are dry.  Eyes:     Conjunctiva/sclera: Conjunctivae normal.  Neck:     Comments: Noted acanthosis nigricans on neck Cardiovascular:     Rate and Rhythm: Normal rate and regular rhythm.     Heart sounds: No murmur.  Pulmonary:     Effort: Pulmonary effort is normal. No respiratory distress.     Breath sounds: Normal breath sounds.  Abdominal:     Palpations: Abdomen is soft.     Tenderness: There is no abdominal tenderness.     Comments: Negative for abdominal tenderness.  Musculoskeletal:     Cervical back: Neck supple.     Comments: 2 wounds on right anterior thigh one measuring 3 cm the other 4 cm.  Inferior wound with surrounding erythema ranging approximately 5 cm.  No noticeable fluctuance, but tenderness to palpation to right anterior thigh with mild swelling.  No noticeable gangrene, dry wound with no pus or other drainage.  Please see photo and physical exam.  Skin:    General: Skin is warm and dry.  Neurological:     Mental Status: She is alert.       ED Results / Procedures / Treatments   Labs (all labs ordered are listed, but only abnormal results are displayed) Labs Reviewed  LACTIC ACID, PLASMA - Abnormal; Notable for the following components:      Result Value   Lactic Acid, Venous 2.3 (*)    All other components within normal limits  COMPREHENSIVE METABOLIC PANEL - Abnormal; Notable for the following components:   Sodium 133 (*)    Chloride 93 (*)    Glucose, Bld 433 (*)    BUN 27 (*)    Creatinine, Ser 1.54 (*)    Albumin 2.5 (*)    GFR calc non Af Amer 45 (*)    GFR calc Af Amer 52 (*)    All other components within normal limits  CBC WITH DIFFERENTIAL/PLATELET - Abnormal; Notable for the following components:  WBC 19.7 (*)    Hemoglobin 11.1 (*)    HCT 33.1 (*)    Platelets 431 (*)    Neutro Abs 17.5 (*)    Abs Immature Granulocytes 0.14 (*)    All other components within normal limits  CULTURE, BLOOD (ROUTINE X 2)    CULTURE, BLOOD (ROUTINE X 2)  URINE CULTURE  SARS CORONAVIRUS 2 (TAT 6-24 HRS)  LACTIC ACID, PLASMA  APTT  PROTIME-INR  URINALYSIS, ROUTINE W REFLEX MICROSCOPIC  I-STAT BETA HCG BLOOD, ED ([facility], [facility], [facility] ONLY)    EKG EKG Interpretation  Date/Time:  Sunday August 23 2019 18:16:55 EDT Ventricular Rate:  120 PR Interval:    QRS Duration: 92 QT Interval:  336 QTC Calculation: 475 R Axis:   52 Text Interpretation: Sinus tachycardia Anterior infarct, old Confirmed by Quintella Reichert [phone]) on 08/23/2019 6:38:50 PM   Radiology DG Chest Port 1 View  Result Date: 08/23/2019 CLINICAL DATA:  Sepsis. Nausea and vomiting. EXAM: PORTABLE CHEST 1 VIEW COMPARISON:  Radiograph 05/16/2019 FINDINGS: Lung volumes are low.The cardiomediastinal contours are normal. Pulmonary vasculature is normal. No consolidation, pleural effusion, or pneumothorax. No acute osseous abnormalities are seen. IMPRESSION: Low lung volumes without acute abnormality. Electronically Signed   By: Keith Rake M.D.   On: 08/23/2019 18:51   DG Knee Complete 4 Views Right  Result Date: 08/23/2019 CLINICAL DATA:  Purulence drainage from right knee wound. EXAM: RIGHT KNEE - COMPLETE 4+ VIEW COMPARISON:  Radiograph 04/19/2019 FINDINGS: Below the knee amputation. Resection margins are sharp. No erosion or periosteal reaction. Stable heterotopic ossification. Knee joint is unremarkable. Mild soft tissue edema. No soft tissue air. IMPRESSION: Below the knee amputation. No radiographic evidence of osteomyelitis. Mild soft tissue edema. No soft tissue air. Electronically Signed   By: Keith Rake M.D.   On: 08/23/2019 18:52    Procedures .Critical Care Performed by: Garald Balding, PA-C Authorized by: Garald Balding, PA-C   Critical care provider statement:    Critical care time (minutes):  35   Critical care was necessary to treat or prevent imminent or life-threatening deterioration of the following conditions:  Sepsis    Critical care was time spent personally by me on the following activities:  Discussions with consultants, evaluation of patient's response to treatment, examination of patient, ordering and performing treatments and interventions, ordering and review of laboratory studies, ordering and review of radiographic studies, pulse oximetry, re-evaluation of patient's condition, obtaining history from patient or surrogate, review of old charts, development of treatment plan with patient or surrogate and blood draw for specimens   (including critical care time)  Medications Ordered in ED Medications  sodium chloride flush (NS) 0.9 % injection 3 mL (has no administration in time range)  ondansetron (ZOFRAN) injection 4 mg (4 mg Intravenous Given 08/23/19 1926)  sodium chloride 0.9 % bolus 1,000 mL (1,000 mLs Intravenous New Bag/Given 08/23/19 1926)  ceFAZolin (ANCEF) IVPB 1 g/50 mL premix (1 g Intravenous New Bag/Given 08/23/19 1931)  sodium chloride 0.9 % bolus 1,000 mL (1,000 mLs Intravenous New Bag/Given 08/23/19 1931)    ED Course  I have reviewed the triage vital signs and the nursing notes.  Pertinent labs & imaging results that were available during my care of the patient were reviewed by me and considered in my medical decision making (see chart for details).    MDM Rules/Calculators/A&P  34 year old with developing wound on anterior right thigh x2 weeks, with increasing nausea/vomiting x2 days. On physical exam, patient is ill-appearing, vomiting into the emesis bag.  She is mildly tachypneic, tachycardic, with blood pressure severely elevated with her systolic blood pressures in the 200s.  She is afebrile at this time, will check rectal temp.  Patient has tenderness to palpation to right BKA where the wound is.  Patient reports that it appears more swollen than baseline.  Wounds are nonfluctuant, with no active drainage/pus.  Patient denies any abdominal/pelvic tenderness.   Patient meets sepsis criteria given patient's tachycardia, elevated white count,tachypnea, with  possible source of infection from her wounds.  Code sepsis was activated per protocol with order sets.  CBC with leukocytosis of 19.7, increased from chronically elevated value 3 months ago.  Patient mildly anemic.  Mild hyponatremia, hypochloremia, worsening renal function at 1.54 compared to 1.373 months ago, possibly secondary to dehydration.  Lactic elevated at 2.3.  Normal APTT.  Blood cultures, UA, second lactic, hemoglobin, beta-hCG pending.  X-ray of right knee shows soft tissue swelling, but with no evidence of osteomyelitis or soft tissue air.  Chest x-ray negative for acute abnormality.  EKG sinus tach.  Anion gap 15, normal bicarb, no sign of DKA at this time, the patient is not taking any medications for her DM.  Given these findings and patient meeting sepsis criteria, in the setting of intractable nausea and vomiting not relieved with Zofran and 2 L fluid bolus in the ER, I believe patient will need hospitalization for IV antibiotics and further management of her GI symptoms.  Suspect element of gastroparesis with subsequent euvolemia as patient has untreated diabetes.   Spoke to Dr. Cyd Silence with hospitalist group, he agrees to admit the patient for further management.   Patient was evaluated by Dr. Ralene Bathe and she is agreeable with the above plan.  Final Clinical Impression(s) / ED Diagnoses Final diagnoses:  Nausea and vomiting, intractability of vomiting not specified, unspecified vomiting type  Sepsis, due to unspecified organism, unspecified whether acute organ dysfunction present [facility])    Rx / DC Orders ED Discharge Orders    None       Garald Balding, PA-C 08/23/19 2055    Garald Balding, PA-C 08/23/19 2105    Quintella Reichert, MD 08/24/19 1429

## 2019-08-23 NOTE — Progress Notes (Signed)
Pharmacy Antibiotic Note  Scarlette LATAUNYA RUUD is a 34 y.o. female admitted on 08/23/2019 has purulent draining wound to leg, hx of BKA on other leg.  Pharmacy has been consulted for vancomycin dosing.  Ceftriaxone per MD.    Plan: Vancomycin 1500 mg IV x 1, then 1000mg  q 24h Goal AUC 400-550. Expected AUC: 500 SCr used: 1.54 Monitor renal function, Cx and clinical progression to narrow Vancomycin levels at steady state   Height: 5\' 2"  (157.5 cm) Weight: 102.1 kg (225 lb) IBW/kg (Calculated) : 50.1  Temp (24hrs), Avg:98.9 F (37.2 C), Min:98.2 F (36.8 C), Max:99.5 F (37.5 C)  Recent Labs  Lab 08/23/19 1511 08/23/19 1845  WBC 19.7*  --   CREATININE 1.54*  --   LATICACIDVEN 1.9 2.3*    Estimated Creatinine Clearance: 59.2 mL/min (A) (by C-G formula based on SCr of 1.54 mg/dL (H)).    Allergies  Allergen Reactions  . Bee Venom Anaphylaxis, Hives, Shortness Of Breath and Swelling  . Lovenox [Enoxaparin Sodium] Other (See Comments)    Hyperkalemia?  . Penicillins Itching    Has patient had a PCN reaction causing immediate rash, facial/tongue/throat swelling, SOB or lightheadedness with hypotension: yes Has patient had a PCN reaction causing severe rash involving mucus membranes or skin necrosis: unknown Has patient had a PCN reaction that required hospitalization : yes Has patient had a PCN reaction occurring within the last 10 years: no If all of the above answers are "NO", then may proceed with Cephalosporin use.     Bertis Ruddy, PharmD Clinical Pharmacist ED Pharmacist Phone # [phone] 08/23/2019 8:44 PM

## 2019-08-23 NOTE — Progress Notes (Signed)
Spoke with Angela Nevin RN who is just waiting for patient to return from [facility] and will be drawing another lactic acid

## 2019-08-23 NOTE — H&P (Addendum)
History and Physical    Sheila Austin UMP:[id number] DOB: 11-Jul-1985 DOA: 08/23/2019  PCP: Maximiano Coss, NP  Patient coming from: Home   Chief Complaint:  Chief Complaint  Patient presents with  . 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  Patient explains that approximately 1 week ago she developed 2 small wounds adjacent to her right knee.  She is unsure as to how she got them.  She states that in the days that followed one of the wounds began to express pus "and look really infected."  The patient then began to regularly pick the scab off but in the days that followed patient began to develop substantial pain at the site.  Patient describes the pain as 7 out of 10 intensity, sharp in quality and radiating proximally.  Pain is worse with movement of the lower extremity.  As the days per continue to progress the redness continue to worsen and this was associated with increasing swelling of the right lower extremity.  Patient states that has her symptoms continue to progress she began to develop generalized weakness, chills and poor appetite.  Approximately 2 days prior to her presentation she also began to develop intense nausea and frequent bouts of vomiting.  Patient describes the vomiting as nonbilious and nonbloody, occurring excess of 12 times daily.  Patient symptoms continue to worsen until she eventually presented to [facility] emergency department for evaluation.  Upon evaluation in the emergency department patient was found to have hypertensive urgency, multiple sirs criteria and clinical concerns for a  right lower extremity cellulitis.  Furthermore, patient was found to continue to exhibit intractable nausea and vomiting.  The hospitalist group was then called to assess the patient for admission the hospital.  Review of Systems: A 10-system review of systems has been performed and all systems are negative with the exception of what is listed in the HPI.    Past Medical History:  Diagnosis Date  . Diabetes mellitus    uncontrolled, last A1c was 14  . E. coli sepsis ([place])   . Ectopic pregnancy   . Essential hypertension, benign 11/01/2008  . Fatty liver   . Gastroparesis   . GERD (gastroesophageal reflux disease)   . Helicobacter pylori gastritis 09/07/2011  . Migraine   . Morbid obesity with body mass index (BMI) of 40.0 to 49.9 ([place])   . Pulmonary edema   . Pyelonephritis   . Ureteral obstruction     Past Surgical History:  Procedure Laterality Date  . AMPUTATION Right 02/04/2019   Procedure: RIGHT BELOW KNEE AMPUTATION;  Surgeon: Newt Minion, MD;  Location: [facility];  Service: Orthopedics;  Laterality: Right;  . ESOPHAGOGASTRODUODENOSCOPY  2010  . WOUND DEBRIDEMENT Right 01/03/2019   Procedure: DEBRIDEMENT WOUND RIGHT LATERAL FOOT;  Surgeon: Angelia Mould, MD;  Location: [facility];  Service: Vascular;  Laterality: Right;     reports that she has never smoked. She has never used smokeless tobacco. She reports previous alcohol use. She reports current drug use. Drug: Cocaine.  Allergies  Allergen Reactions  . Bee Venom Anaphylaxis, Hives, Shortness Of Breath and Swelling  . Lovenox [Enoxaparin Sodium] Other (See Comments)  Hyperkalemia?  . Penicillins Itching    Has patient had a PCN reaction causing immediate rash, facial/tongue/throat swelling, SOB or lightheadedness with hypotension: yes Has patient had a PCN reaction causing severe rash involving mucus membranes or skin necrosis: unknown Has patient had a PCN reaction that required hospitalization : yes Has patient  had a PCN reaction occurring within the last 10 years: no If all of the above answers are "NO", then may proceed with Cephalosporin use.     Family History  Problem Relation Age of Onset  . Arthritis Mother   . Asthma Mother   . COPD Mother   . Depression Mother   . Diabetes Mother   . Hypertension Mother   . Mental illness Mother   . Kidney disease Mother   . Crohn's disease Mother   . Aneurysm Father 32       brain  . Mental illness Sister   . Arthritis Maternal Aunt   . Asthma Maternal Aunt   . COPD Maternal Aunt   . Depression Maternal Aunt   . Diabetes Maternal Aunt   . Hypertension Maternal Aunt   . Mental illness Maternal Aunt   . Stroke Maternal Aunt   . Heart failure Maternal Aunt   . Heart failure Maternal Grandfather   . Cancer Maternal Grandfather   . Diabetes Maternal Grandfather   . Heart disease Maternal Grandfather   . Hypertension Maternal Grandfather   . Hyperlipidemia Maternal Grandfather   . Mental illness Brother   . Diabetes Maternal Grandmother   . Heart disease Maternal Grandmother   . Hypertension Maternal Grandmother   . Hyperlipidemia Maternal Grandmother   . Diabetes Paternal Grandmother   . Heart disease Paternal Grandmother   . Diabetes Paternal Grandfather   . Cancer Paternal Grandfather      Prior to Admission medications   Medication Sig Start Date End Date Taking? Authorizing Provider  acetaminophen (TYLENOL) 500 MG tablet Take 500 mg by mouth every 6 (six) hours as needed for headache (pain).    [provider]  Bismuth Subsalicylate (PEPTO-BISMOL PO) Take 2 tablets by mouth daily as needed (upset stomach).    [provider]  gabapentin (NEURONTIN) 100 MG capsule Take 1 capsule (100 mg total) by mouth 2 (two) times daily. Patient not taking: Reported on 05/16/2019 02/19/19   Love, Ivan Anchors, PA-C  Insulin Isophane & Regular Human (NOVOLIN 70/30 FLEXPEN) (70-30) 100 UNIT/ML PEN Inject 14 Units into the skin 2 (two)  times daily. Patient not taking: Reported on 05/16/2019 04/20/19   Truett Mainland, DO  lisinopril (ZESTRIL) 2.5 MG tablet Take 1 tablet (2.5 mg total) by mouth daily. Patient not taking: Reported on 05/16/2019 04/20/19   Truett Mainland, DO  omeprazole (PRILOSEC OTC) 20 MG tablet Take 20 mg by mouth daily as needed (upset stomach).    [provider]  ondansetron (ZOFRAN ODT) 4 MG disintegrating tablet 4mg  ODT q8 hours prn nausea/vomit 05/18/19   Palumbo, April, MD    Physical Exam: Vitals:   08/23/19 1452 08/23/19 1815 08/23/19 1934  BP: (!) 207/105 (!) 205/114   Pulse: (!) 114 (!) 120   Resp: 18 (!) 25   Temp: 98.2 F (36.8 C)  99.5 F (37.5 C)  TempSrc: Oral  Rectal  SpO2: 98% 98%   Weight: 102.1 kg    Height: 5\' 2"  (1.575 m)       Constitutional: But arousable and oriented x3, patient is not in any acute distress.  Patient is obese. Skin: 2 notable shallow ulcerations adjacent to the right knee.  Medial ulceration is approximately 2 cm in diameter with surrounding redness, induration and tenderness.  Poor skin turgor overall. Eyes: Pupils are equally reactive to light.  No evidence of scleral icterus or conjunctival pallor.  ENMT: Extremely dry mucous membranes noted.Marland Kitchen Posterior pharynx clear of any exudate or lesions. Normal dentition.   Neck: normal, supple, no masses, no thyromegaly Respiratory: clear to auscultation bilaterally, no wheezing, no crackles. Normal respiratory effort. No accessory muscle use.  Cardiovascular: Regular rate and rhythm, no murmurs / rubs / gallops. No extremity edema. 2+ pedal pulses. No carotid bruits.  Back:   Nontender without crepitus or deformity. Abdomen: Notable generalized abdominal tenderness.  Abdomen is soft however.  No evidence of intra-abdominal masses.  Positive bowel sounds noted in all quadrants.   Musculoskeletal: Significant tenderness with associated edema and induration of the right thigh and area of the previous BKA.   Please note skin examination findings above.  Otherwise, good range of motion without contractures and normal muscle tone. Neurologic: CN 2-12 grossly intact. Sensation intact, patient is moving all 4 extremities spontaneously.  Patient is following all commands.  Patient is responsive to verbal stimuli.   Psychiatric: Patient presents with a depressed mood with flat affect.  Patient seems to possess insight as to theircurrent situation.     Labs on Admission: I have personally reviewed following labs and imaging studies -   CBC: Recent Labs  Lab 08/23/19 1511  WBC 19.7*  NEUTROABS 17.5*  HGB 11.1*  HCT 33.1*  MCV 85.1  PLT 814*   Basic Metabolic Panel: Recent Labs  Lab 08/23/19 1511  NA 133*  K 3.7  CL 93*  CO2 25  GLUCOSE 433*  BUN 27*  CREATININE 1.54*  CALCIUM 9.0   GFR: Estimated Creatinine Clearance: 59.2 mL/min (A) (by C-G formula based on SCr of 1.54 mg/dL (H)). Liver Function Tests: Recent Labs  Lab 08/23/19 1511  AST 15  ALT 14  ALKPHOS 98  BILITOT 1.0  PROT 7.5  ALBUMIN 2.5*   No results for input(s): LIPASE, AMYLASE in the last 168 hours. No results for input(s): AMMONIA in the last 168 hours. Coagulation Profile: Recent Labs  Lab 08/23/19 1845  INR 1.1   Cardiac Enzymes: No results for input(s): CKTOTAL, CKMB, CKMBINDEX, TROPONINI in the last 168 hours. BNP (last 3 results) No results for input(s): PROBNP in the last 8760 hours. HbA1C: Recent Labs    08/23/19 1845  HGBA1C 9.8*   CBG: Recent Labs  Lab 08/23/19 2051  GLUCAP 423*   Lipid Profile: No results for input(s): CHOL, HDL, LDLCALC, TRIG, CHOLHDL, LDLDIRECT in the last 72 hours. Thyroid Function Tests: No results for input(s): TSH, T4TOTAL, FREET4, T3FREE, THYROIDAB in the last 72 hours. Anemia Panel: No results for input(s): VITAMINB12, FOLATE, FERRITIN, TIBC, IRON, RETICCTPCT in the last 72 hours. Urine analysis:    Component Value Date/Time   COLORURINE YELLOW  05/18/2019 0038   APPEARANCEUR HAZY (A) 05/18/2019 0038   LABSPEC 1.028 05/18/2019 0038   PHURINE 7.0 05/18/2019 0038   GLUCOSEU >=500 (A) 05/18/2019 0038   HGBUR MODERATE (A) 05/18/2019 0038   BILIRUBINUR NEGATIVE 05/18/2019 0038   BILIRUBINUR negative 08/13/2017 1622   KETONESUR 20 (A) 05/18/2019 0038   PROTEINUR >=300 (A) 05/18/2019 0038   UROBILINOGEN 0.2 08/13/2017 1622   UROBILINOGEN 0.2 11/25/2014 0553   NITRITE NEGATIVE 05/18/2019 0038   LEUKOCYTESUR NEGATIVE 05/18/2019 0038  Radiological Exams on Admission: DG Chest Port 1 View  Result Date: 08/23/2019 CLINICAL DATA:  Sepsis. Nausea and vomiting. EXAM: PORTABLE CHEST 1 VIEW COMPARISON:  Radiograph 05/16/2019 FINDINGS: Lung volumes are low.The cardiomediastinal contours are normal. Pulmonary vasculature is normal. No consolidation, pleural effusion, or pneumothorax. No acute osseous abnormalities are seen. IMPRESSION: Low lung volumes without acute abnormality. Electronically Signed   By: Keith Rake M.D.   On: 08/23/2019 18:51   DG Knee Complete 4 Views Right  Result Date: 08/23/2019 CLINICAL DATA:  Purulence drainage from right knee wound. EXAM: RIGHT KNEE - COMPLETE 4+ VIEW COMPARISON:  Radiograph 04/19/2019 FINDINGS: Below the knee amputation. Resection margins are sharp. No erosion or periosteal reaction. Stable heterotopic ossification. Knee joint is unremarkable. Mild soft tissue edema. No soft tissue air. IMPRESSION: Below the knee amputation. No radiographic evidence of osteomyelitis. Mild soft tissue edema. No soft tissue air. Electronically Signed   By: Keith Rake M.D.   On: 08/23/2019 18:52    EKG: Personally reviewed.  Rhythm is sinus tachycardia with heart rate of 120 bpm.  No dynamic ST segment changes appreciated.  Assessment/Plan Active Problems:   Sepsis due to cellulitis [facility])   Patient is exhibiting multiple SIRS criteria including tachycardia in excess of 120 bpm, substantial leukocytosis and  lactic acidosis  This is thought to be secondary to developing cellulitis of the right lower extremity, near the previous BKA  Due to patient's reports of purulent drainage, we are covering for MRSA with intravenous vancomycin  Due to severity of patient's presentation including concurrent DKA and lactic acidosis we will also broaden the antibiotic coverage with intravenous ceftriaxone and metronidazole for now.  Antibiotics may rapidly de-escalate as patient improves.  Blood cultures obtained  Hydrating patient aggressively with intravenous lactated Ringer solution  X-ray of the right lower extremity reveals no evidence of osteomyelitis.  Obtaining ultrasound to evaluate for any evidence of underlying abscess.  Wound care consultation ordered  Treating concurrent severe hyperglycemia  Lactic Acidosis   Plan as noted above    Hypertensive urgency   Patient exhibiting hypertensive urgency with blood pressures as high as 205/114 in the emergency department.  This is likely a combination of medication noncompliance with increasing in blood pressure due to pain as well as intractable nausea vomiting  Will place patient on as needed intravenous antihypertensives for now  Once patient is able to tolerate oral intake we will titrate oral antihypertensive therapy.    Cellulitis of right lower extremity   Plan as noted above under sepsis    Uncontrolled Diabetes Mellitus type 2 with Hyperglycemia   Patient presenting with hyperglycemia with blood sugars of over 400 on initial chemistry   Patient is likely suffering from severe hyperglycemia due to medication noncompliance and currently sepsis.  Patient has been placed on insulin infusion temporarily in the emergency department for several hours prior to being swtiched to basal bolus insulin therapy  Performing frequent Accu-Cheks  Advancing diabetic diet as tolerated, currently NPO  Hemoglobin A1C pending    Intractable  nausea and vomiting   Patient presenting with intractable nausea and vomiting for the past 2 days  Patient has documented past medical history of gastroparesis  Treating patient with as needed intravenous antiemetics including intravenous Reglan  Patient is currently n.p.o.  As patient clinically improves nausea vomiting should also improve  Obtaining abdominal x-ray to rule out obstruction    GERD with esophagitis  Patient complaining of burning chest pain likely secondary to esophagitis  from frequent vomiting  Treating patient with GI cocktail  Treating patient with Protonix 40 mg IV every 24 hours      Code Status:  Full code Family Communication: Family at bedside  Status is: Inpatient  Remains inpatient appropriate because:Ongoing diagnostic testing needed not appropriate for outpatient work up, IV treatments appropriate due to intensity of illness or inability to take PO and Inpatient level of care appropriate due to severity of illness   Dispo: The patient is from: Home              Anticipated d/c is to: Home              Anticipated d/c date is: 3 days              Patient currently is not medically stable to d/c.         Vernelle Emerald MD [facility] Pager [phone]  If 7PM-7AM, please contact night-coverage [email or web address] Use universal [place] password for that web site. If you do not have the password, please call the hospital operator.  08/23/2019, 9:01 PM

## 2019-08-23 NOTE — ED Triage Notes (Signed)
Patient complains of 2 days of nausea and vomiting. Patient also has purulent draining wound to upper right leg, bka to right leg this past t=year. Reports wound x 2 weeks.

## 2019-08-23 NOTE — Progress Notes (Signed)
Notified provider and bedside nurse of need to order antibiotics.  

## 2019-08-23 NOTE — ED Notes (Signed)
To ultrasound

## 2019-08-23 NOTE — ED Notes (Signed)
Attempted to collect 2nd set of blood cultures, unsuccessful x 2.  Alford Highland RN attempted and unsuccessful.  Belaya PA made aware, will start antibiotlics.

## 2019-08-24 ENCOUNTER — Inpatient Hospital Stay (HOSPITAL_COMMUNITY): Payer: Medicaid Other

## 2019-08-24 DIAGNOSIS — K2100 Gastro-esophageal reflux disease with esophagitis, without bleeding: Secondary | ICD-10-CM

## 2019-08-24 DIAGNOSIS — L0390 Cellulitis, unspecified: Secondary | ICD-10-CM

## 2019-08-24 DIAGNOSIS — I160 Hypertensive urgency: Secondary | ICD-10-CM

## 2019-08-24 DIAGNOSIS — E872 Acidosis: Secondary | ICD-10-CM

## 2019-08-24 DIAGNOSIS — A419 Sepsis, unspecified organism: Principal | ICD-10-CM

## 2019-08-24 DIAGNOSIS — L03115 Cellulitis of right lower limb: Secondary | ICD-10-CM

## 2019-08-24 DIAGNOSIS — R112 Nausea with vomiting, unspecified: Secondary | ICD-10-CM

## 2019-08-24 DIAGNOSIS — E1165 Type 2 diabetes mellitus with hyperglycemia: Secondary | ICD-10-CM

## 2019-08-24 LAB — BASIC METABOLIC PANEL
Anion gap: 13 (ref 5–15)
BUN: 27 mg/dL — ABNORMAL HIGH (ref 6–20)
CO2: 26 mmol/L (ref 22–32)
Calcium: 8.4 mg/dL — ABNORMAL LOW (ref 8.9–10.3)
Chloride: 99 mmol/L (ref 98–111)
Creatinine, Ser: 1.62 mg/dL — ABNORMAL HIGH (ref 0.44–1.00)
GFR calc Af Amer: 49 mL/min — ABNORMAL LOW (ref >60)
GFR calc non Af Amer: 42 mL/min — ABNORMAL LOW (ref >60)
Glucose, Bld: 191 mg/dL — ABNORMAL HIGH (ref 70–99)
Potassium: 3.4 mmol/L — ABNORMAL LOW (ref 3.5–5.1)
Sodium: 138 mmol/L (ref 135–145)

## 2019-08-24 LAB — CBG MONITORING, ED
Glucose-Capillary: 155 mg/dL — ABNORMAL HIGH (ref 70–99)
Glucose-Capillary: 167 mg/dL — ABNORMAL HIGH (ref 70–99)
Glucose-Capillary: 177 mg/dL — ABNORMAL HIGH (ref 70–99)
Glucose-Capillary: 190 mg/dL — ABNORMAL HIGH (ref 70–99)

## 2019-08-24 LAB — GLUCOSE, CAPILLARY
Glucose-Capillary: 187 mg/dL — ABNORMAL HIGH (ref 70–99)
Glucose-Capillary: 216 mg/dL — ABNORMAL HIGH (ref 70–99)
Glucose-Capillary: 112 mg/dL — ABNORMAL HIGH (ref 70–99)

## 2019-08-24 LAB — I-STAT BETA HCG BLOOD, ED (MC, WL, AP ONLY)
I-stat hCG, quantitative: <5 m[IU]/mL (ref <5)
I-stat hCG, quantitative: <5 m[IU]/mL (ref <5)

## 2019-08-24 LAB — SARS CORONAVIRUS 2 (TAT 6-24 HRS): SARS Coronavirus 2: NEGATIVE

## 2019-08-24 LAB — BETA-HYDROXYBUTYRIC ACID: Beta-Hydroxybutyric Acid: 0.87 mmol/L — ABNORMAL HIGH (ref 0.05–0.27)

## 2019-08-24 LAB — C-REACTIVE PROTEIN: CRP: 0.6 mg/dL (ref <1.0)

## 2019-08-24 LAB — LACTIC ACID, PLASMA
Lactic Acid, Venous: 4.3 mmol/L (ref 0.5–1.9)
Lactic Acid, Venous: 1.6 mmol/L (ref 0.5–1.9)

## 2019-08-24 LAB — MRSA PCR SCREENING: MRSA by PCR: POSITIVE — AB

## 2019-08-24 LAB — PROCALCITONIN: Procalcitonin: <0.1 ng/mL

## 2019-08-24 MED ORDER — INSULIN GLARGINE 100 UNIT/ML ~~LOC~~ SOLN
21.0000 [IU] | Freq: Every day | SUBCUTANEOUS | Status: DC
  Administered 2019-08-24: 21 [IU] via SUBCUTANEOUS
  Filled 2019-08-24 (×3): qty 0.21

## 2019-08-24 MED ORDER — SODIUM CHLORIDE 0.9 % IV SOLN
2.0000 g | INTRAVENOUS | Status: DC
  Administered 2019-08-24 – 2019-08-25 (×2): 2 g via INTRAVENOUS
  Filled 2019-08-24 (×2): qty 2

## 2019-08-24 MED ORDER — LABETALOL HCL 5 MG/ML IV SOLN
5.0000 mg | INTRAVENOUS | Status: DC | PRN
  Administered 2019-08-24 – 2019-08-25 (×2): 5 mg via INTRAVENOUS
  Filled 2019-08-24 (×2): qty 4

## 2019-08-24 MED ORDER — LACTATED RINGERS IV SOLN: INTRAVENOUS | Status: DC

## 2019-08-24 MED ORDER — SODIUM CHLORIDE 0.9 % IV SOLN: INTRAVENOUS | Status: DC

## 2019-08-24 MED ORDER — METOCLOPRAMIDE HCL 5 MG/ML IJ SOLN: 10.0000 mg | Freq: Four times a day (QID) | INTRAMUSCULAR | Status: DC | PRN

## 2019-08-24 MED ORDER — INSULIN ASPART 100 UNIT/ML ~~LOC~~ SOLN
7.0000 [IU] | Freq: Three times a day (TID) | SUBCUTANEOUS | Status: DC
  Administered 2019-08-24 – 2019-08-25 (×4): 7 [IU] via SUBCUTANEOUS

## 2019-08-24 MED ORDER — MORPHINE SULFATE (PF) 4 MG/ML IV SOLN
4.0000 mg | INTRAVENOUS | Status: DC | PRN
  Administered 2019-08-24 (×2): 4 mg via INTRAVENOUS
  Filled 2019-08-24 (×3): qty 1

## 2019-08-24 MED ORDER — METOCLOPRAMIDE HCL 5 MG/ML IJ SOLN
10.0000 mg | Freq: Three times a day (TID) | INTRAMUSCULAR | Status: DC
  Administered 2019-08-24 – 2019-08-25 (×6): 10 mg via INTRAVENOUS
  Filled 2019-08-24 (×6): qty 2

## 2019-08-24 MED ORDER — PROCHLORPERAZINE EDISYLATE 10 MG/2ML IJ SOLN
10.0000 mg | Freq: Four times a day (QID) | INTRAMUSCULAR | Status: DC | PRN
  Administered 2019-08-24 – 2019-08-25 (×2): 10 mg via INTRAVENOUS
  Filled 2019-08-24 (×4): qty 2

## 2019-08-24 MED ORDER — MORPHINE SULFATE (PF) 4 MG/ML IV SOLN
4.0000 mg | Freq: Once | INTRAVENOUS | Status: AC
  Administered 2019-08-24: 4 mg via INTRAVENOUS
  Filled 2019-08-24: qty 1

## 2019-08-24 MED ORDER — POTASSIUM CHLORIDE 20 MEQ/15ML (10%) PO SOLN
40.0000 meq | Freq: Once | ORAL | Status: AC
  Administered 2019-08-24: 40 meq via ORAL
  Filled 2019-08-24: qty 30

## 2019-08-24 MED ORDER — INSULIN ASPART 100 UNIT/ML ~~LOC~~ SOLN
0.0000 [IU] | Freq: Three times a day (TID) | SUBCUTANEOUS | Status: DC
  Administered 2019-08-24 (×2): 3 [IU] via SUBCUTANEOUS
  Administered 2019-08-24 – 2019-08-25 (×2): 5 [IU] via SUBCUTANEOUS
  Administered 2019-08-25: 2 [IU] via SUBCUTANEOUS

## 2019-08-24 MED ORDER — LACTATED RINGERS IV BOLUS
1000.0000 mL | Freq: Once | INTRAVENOUS | Status: AC
  Administered 2019-08-24: 1000 mL via INTRAVENOUS

## 2019-08-24 MED ORDER — MORPHINE SULFATE (PF) 2 MG/ML IV SOLN: 2.0000 mg | INTRAVENOUS | Status: DC | PRN

## 2019-08-24 NOTE — Progress Notes (Signed)
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 Sharol Given of the right leg infection/osteomyelitis came into the hospital with right lower extremity pain, associated with intractable nausea and vomiting.  Patient tells me that she uses the right stump as pivot point and support, and has been doing that for a while, however about a week ago she developed 2 small wounds adjacent to her right knee.  The wounds have gotten bigger and looks like pus was coming out few days ago.  She also states that she was picking on the scab.  She started developing severe pain in the area, worse with movement, redness appeared and started to spread, her leg became more swollen and decided to come to the hospital.  She also reports fever and chills along with poor appetite.  Subjective / 24h Interval events: She is still complaining of pain in the ED, complains of nausea.  No chest pain, no shortness of breath.  Assessment & Plan: Principal Problem Sepsis due to cellulitis/wounds overlying right knee-she met sepsis criteria on admission with tachycardia, leukocytosis, lactic acidosis.  Patient was placed on broad-spectrum antibiotics with vancomycin, metronidazole and ceftriaxone, continue for now.  Blood cultures were obtained and continue to monitor -Her lactic acid actually is increasing this morning, continue aggressive fluids and repeat lactic acid around lunchtime -X-ray of the right lower extremity revealed no evidence of osteomyelitis and ultrasound showed soft tissue edema without abscess.  Clinically monitor, if she continues to have pain and tenderness in the area will warrant an MRI by tomorrow. -Obtain MRSA PCR, if negative discontinue  vancomycin  Active Problems Insulin-dependent type 2 diabetes mellitus -Patient CBG was in the 400s when she came into the hospital and was placed initially on a insulin infusion.  She does not seem to have a anion gap and she was not acidotic.  She was transitioned off of the insulin infusion and currently she is on home regimen of Lantus along with NovoLog.  Continue.  Nausea/vomiting -Possibly in the setting of sepsis along with hyperglycemia, allow clear liquid diet and provide supportive treatment with IV fluids and antiemetics.  Continue IV PPI as well along with Reglan.  Acute kidney injury -Her creatinine has been within normal limits in 2020, however in 2021 has been progressively higher around 1.3-1.4, I wonder whether she is developing a degree of chronic kidney disease.  Only time will tell. -Give IV fluids and monitor renal function.  Hypertensive urgency -Significantly elevated blood pressure on admission 205/114 likely due to intractable nausea and vomiting as well as inability to take oral meds.  Patient will be placed on as needed labetalol, treating pain along with nausea and vomiting will help the blood pressure for now.  Scheduled Meds: . insulin aspart  0-15 Units Subcutaneous TID AC & HS  . insulin aspart  7 Units Subcutaneous TID WC  . insulin glargine  21 Units Subcutaneous Q0600  . metoCLOPramide (REGLAN) injection  10 mg Intravenous TID AC  . pantoprazole (PROTONIX) IV  40 mg Intravenous Q24H  . sodium chloride flush  3 mL Intravenous Once   Continuous Infusions: . sodium chloride 125 mL/hr at 08/24/19 0802  . cefTRIAXone (ROCEPHIN)  IV Stopped (08/23/19 2203)  . metronidazole Stopped (08/24/19 0513)  .  vancomycin     PRN Meds:.acetaminophen **OR** acetaminophen, dextrose, enalaprilat, labetalol, morphine injection **OR** morphine injection, prochlorperazine  DVT prophylaxis: SCDs, she appears to be allergic to Lovenox Code Status: Full code Family  Communication: Discussed with fianc who is present at bedside Patient admitted from: Home Anticipated d/c place: Home Barriers to d/c: Remains septic with elevated lactic acid continuing to need IV antibiotics and IV fluids.  Symptomatic with nausea and vomiting, advance diet  Consultants:  None  Procedures:  None   Microbiology  Blood cultures - pending   Antimicrobials: Vancomycin 4/18 >> Ceftriaxone 4/18 >> Metronidazole 4/18 >>   Objective: Vitals:   08/24/19 0700 08/24/19 0730 08/24/19 0800 08/24/19 0830  BP: (!) 155/102 (!) 193/93 (!) 198/119 (!) 201/113  Pulse: (!) 124 (!) 120 (!) 118 (!) 118  Resp: (!) 30 (!) 27 (!) 21 (!) 27  Temp:      TempSrc:      SpO2: 99% 98% 98% 96%  Weight:      Height:        Intake/Output Summary (Last 24 hours) at 08/24/2019 6962 Last data filed at 08/23/2019 2204 Gross per 24 hour  Intake 250 ml  Output --  Net 250 ml   Filed Weights   08/23/19 1452  Weight: 102.1 kg    Examination: Constitutional: Appears in pain, leaning forward Eyes: no scleral icterus ENMT: Mucous membranes are moist.  Neck: normal, supple Respiratory: clear to auscultation bilaterally, no wheezing, no crackles. Normal respiratory effort. Cardiovascular: Regular rate and rhythm, no murmurs / rubs / gallops. No LE edema.  Abdomen: non distended, no tenderness. Bowel sounds positive.  Musculoskeletal: no clubbing / cyanosis.  Skin: 2 lesions on the anterior right knee, please see picture on the ED note, surrounding cellulitis present, tender to palpation, warm to touch Neurologic: CN 2-12 grossly intact.  Equal strength Psychiatric: Normal judgment and insight. Alert and oriented x 3. Normal mood.    Data Reviewed: I have independently reviewed following labs and imaging studies   CBC: Recent Labs  Lab 08/23/19 1511  WBC 19.7*  NEUTROABS 17.5*  HGB 11.1*  HCT 33.1*  MCV 85.1  PLT 952*   Basic Metabolic Panel: Recent Labs  Lab 08/23/19 1511  08/24/19 0045  NA 133* 138  K 3.7 3.4*  CL 93* 99  CO2 25 26  GLUCOSE 433* 191*  BUN 27* 27*  CREATININE 1.54* 1.62*  CALCIUM 9.0 8.4*   Liver Function Tests: Recent Labs  Lab 08/23/19 1511  AST 15  ALT 14  ALKPHOS 98  BILITOT 1.0  PROT 7.5  ALBUMIN 2.5*   Coagulation Profile: Recent Labs  Lab 08/23/19 1845  INR 1.1   HbA1C: Recent Labs    08/23/19 1845  HGBA1C 9.8*   CBG: Recent Labs  Lab 08/23/19 2329 08/24/19 0034 08/24/19 0220 08/24/19 0521 08/24/19 0744  GLUCAP 237* 177* 167* 155* 190*    Recent Results (from the past 240 hour(s))  SARS CORONAVIRUS 2 (TAT 6-24 HRS) Nasopharyngeal Nasopharyngeal Swab     Status: None   Collection Time: 08/23/19  9:00 PM   Specimen: Nasopharyngeal Swab  Result Value Ref Range Status   SARS Coronavirus 2 NEGATIVE NEGATIVE Final    Comment: (NOTE) SARS-CoV-2 target nucleic acids are NOT DETECTED. The SARS-CoV-2 RNA is generally detectable in upper and lower respiratory specimens during the acute phase of infection. Negative results do not preclude SARS-CoV-2 infection, do not rule out co-infections with other pathogens, and should not be  used as the sole basis for treatment or other patient management decisions. Negative results must be combined with clinical observations, patient history, and epidemiological information. The expected result is Negative. Fact Sheet for Patients: SugarRoll.be Fact Sheet for Healthcare Providers: https://www.woods-mathews.com/ This test is not yet approved or cleared by the [place] FDA and  has been authorized for detection and/or diagnosis of SARS-CoV-2 by FDA under an Emergency Use Authorization (EUA). This EUA will remain  in effect (meaning this test can be used) for the duration of the COVID-19 declaration under Section 56 4(b)(1) of the Act, 21 U.S.C. section 360bbb-3(b)(1), unless the authorization is terminated or revoked  sooner. Performed at [facility], [place] [address] [place], [place] [address]      Radiology Studies: DG Chest Port 1 View  Result Date: 08/23/2019 CLINICAL DATA:  Sepsis. Nausea and vomiting. EXAM: PORTABLE CHEST 1 VIEW COMPARISON:  Radiograph 05/16/2019 FINDINGS: Lung volumes are low.The cardiomediastinal contours are normal. Pulmonary vasculature is normal. No consolidation, pleural effusion, or pneumothorax. No acute osseous abnormalities are seen. IMPRESSION: Low lung volumes without acute abnormality. Electronically Signed   By: Keith Rake M.D.   On: 08/23/2019 18:51   DG Knee Complete 4 Views Right  Result Date: 08/23/2019 CLINICAL DATA:  Purulence drainage from right knee wound. EXAM: RIGHT KNEE - COMPLETE 4+ VIEW COMPARISON:  Radiograph 04/19/2019 FINDINGS: Below the knee amputation. Resection margins are sharp. No erosion or periosteal reaction. Stable heterotopic ossification. Knee joint is unremarkable. Mild soft tissue edema. No soft tissue air. IMPRESSION: Below the knee amputation. No radiographic evidence of osteomyelitis. Mild soft tissue edema. No soft tissue air. Electronically Signed   By: Keith Rake M.D.   On: 08/23/2019 18:52   [place] RT LOWER EXTREM LTD SOFT TISSUE NON VASCULAR  Result Date: 08/23/2019 CLINICAL DATA:  Evaluate for abscess.  Knee swelling. EXAM: ULTRASOUND RIGHT LOWER EXTREMITY LIMITED TECHNIQUE: Ultrasound examination of the lower extremity soft tissues was performed in the area of clinical concern. COMPARISON:  None. FINDINGS: Edema noted within the soft tissues in the area concern. No focal fluid collection to suggest abscess. IMPRESSION: Soft tissue edema.  No focal abscess. Electronically Signed   By: Rolm Baptise M.D.   On: 08/23/2019 23:00   Marzetta Board, MD, PhD [facility]  Between 7 am - 7 pm I am available, please contact me via Amion or Securechat  Between 7 pm - 7 am I am not available, please contact night coverage  MD/APP via Amion

## 2019-08-24 NOTE — Consult Note (Signed)
[place] Nurse Consult Note: Reason for Consult: Patient with Right BKA (01/2019, Dr. Eather Colas) presents with two wounds at knee.  Wound type: Infectious Pressure Injury POA:N/A Measurement: Proximal: 1.2cm x 2cm x 0.1cm.  Red, dried serum at periphery. Scant serous drainage Distal: 1cm x 1cm x 0.1cm. Red, Dried serum. No drainage Wound bed:As described above Drainage (amount, consistency, odor) As described above Periwound: Ruddy brown Dressing procedure/placement/frequency: Patient has been started on systemic antibiotics. Topical care with antimicrobial, nonadherent dressing (xeroform) twice daily following soap and water cleanse will be initiated an continue while in house.  [place] nursing team will not follow, but will remain available to this patient, the nursing and medical teams.  Please re-consult if needed. Thanks, Maudie Flakes, MSN, RN, [place], Arther Abbott  Pager# [phone]

## 2019-08-24 NOTE — Progress Notes (Signed)
Inpatient Diabetes Program Recommendations  AACE/ADA: New Consensus Statement on Inpatient Glycemic Control (2015)  Target Ranges:  Prepandial:   less than 140 mg/dL      Peak postprandial:   less than 180 mg/dL (1-2 hours)      Critically ill patients:  140 - 180 mg/dL   Lab Results  Component Value Date   GLUCAP 187 (H) 08/24/2019   HGBA1C 9.8 (H) 08/23/2019    Review of Glycemic Control  Diabetes history: DM 2 diagnosed as a child Outpatient Diabetes medications: Lantus 40, Novolog sliding scale tid up to 20 units Current orders for Inpatient glycemic control:  Lantus 21 units Novolog 0-15 units tid  Novolog 7 units tid meal coverage  A1c 9.8% this admission  Spoke with pt regarding A1c and DM control at home. Pt was working for [employer] back in August 2020 and used the [employer]. Pt then had amputation in September 2020. Pt reports rationing her insulin as she was not able to get her insulin consistently due to insurance. Pt now has active medicaid and will be able to get insulin and all needed supplies.   Pt reports needing all insulin and DM supplies at time of d/c.  Lantus solostar insulin pen order # [id number] Novolog Flextouch insulin pen order # [id number] Insulin pen needles order # [id number] Glucose meter kit order # [id number]  Thanks,  Tama Headings RN, MSN, BC-ADM Inpatient Diabetes Coordinator Team Pager [phone] (8a-5p)

## 2019-08-24 NOTE — ED Notes (Signed)
Got patient back in bed on the monitor patient is resting with call bell in reach and family at bedside

## 2019-08-24 NOTE — ED Notes (Signed)
Breakfast ordered 

## 2019-08-24 NOTE — ED Notes (Signed)
Help patient up to the wheel chair to the bathroom patient did well

## 2019-08-25 LAB — CBC
HCT: 32.3 % — ABNORMAL LOW (ref 36.0–46.0)
Hemoglobin: 10.9 g/dL — ABNORMAL LOW (ref 12.0–15.0)
MCH: 28.2 pg (ref 26.0–34.0)
MCHC: 33.7 g/dL (ref 30.0–36.0)
MCV: 83.7 fL (ref 80.0–100.0)
Platelets: 331 10*3/uL (ref 150–400)
RBC: 3.86 MIL/uL — ABNORMAL LOW (ref 3.87–5.11)
RDW: 12.3 % (ref 11.5–15.5)
WBC: 14.4 10*3/uL — ABNORMAL HIGH (ref 4.0–10.5)
nRBC: 0 % (ref 0.0–0.2)

## 2019-08-25 LAB — COMPREHENSIVE METABOLIC PANEL
ALT: 11 U/L (ref 0–44)
AST: 24 U/L (ref 15–41)
Albumin: 2.4 g/dL — ABNORMAL LOW (ref 3.5–5.0)
Alkaline Phosphatase: 98 U/L (ref 38–126)
Anion gap: 12 (ref 5–15)
BUN: 20 mg/dL (ref 6–20)
CO2: 25 mmol/L (ref 22–32)
Calcium: 8.2 mg/dL — ABNORMAL LOW (ref 8.9–10.3)
Chloride: 98 mmol/L (ref 98–111)
Creatinine, Ser: 1.31 mg/dL — ABNORMAL HIGH (ref 0.44–1.00)
GFR calc Af Amer: >60 mL/min (ref >60)
GFR calc non Af Amer: 54 mL/min — ABNORMAL LOW (ref >60)
Glucose, Bld: 225 mg/dL — ABNORMAL HIGH (ref 70–99)
Potassium: 3.4 mmol/L — ABNORMAL LOW (ref 3.5–5.1)
Sodium: 135 mmol/L (ref 135–145)
Total Bilirubin: 0.8 mg/dL (ref 0.3–1.2)
Total Protein: 7 g/dL (ref 6.5–8.1)

## 2019-08-25 LAB — URINE CULTURE

## 2019-08-25 LAB — PHOSPHORUS: Phosphorus: 2.8 mg/dL (ref 2.5–4.6)

## 2019-08-25 LAB — GLUCOSE, CAPILLARY
Glucose-Capillary: 230 mg/dL — ABNORMAL HIGH (ref 70–99)
Glucose-Capillary: 150 mg/dL — ABNORMAL HIGH (ref 70–99)
Glucose-Capillary: 110 mg/dL — ABNORMAL HIGH (ref 70–99)

## 2019-08-25 LAB — MAGNESIUM: Magnesium: 1.7 mg/dL (ref 1.7–2.4)

## 2019-08-25 MED ORDER — PRO-STAT SUGAR FREE PO LIQD
30.0000 mL | Freq: Three times a day (TID) | ORAL | Status: DC
  Administered 2019-08-25: 30 mL via ORAL
  Filled 2019-08-25: qty 30

## 2019-08-25 MED ORDER — AMLODIPINE BESYLATE 10 MG PO TABS
10.0000 mg | ORAL_TABLET | Freq: Every day | ORAL | Status: DC
  Administered 2019-08-25: 10 mg via ORAL
  Filled 2019-08-25: qty 1

## 2019-08-25 MED ORDER — ONDANSETRON HCL 4 MG/2ML IJ SOLN
4.0000 mg | Freq: Four times a day (QID) | INTRAMUSCULAR | Status: DC | PRN
  Administered 2019-08-25: 4 mg via INTRAVENOUS
  Filled 2019-08-25: qty 2

## 2019-08-25 MED ORDER — ADULT MULTIVITAMIN W/MINERALS CH
1.0000 | ORAL_TABLET | Freq: Every day | ORAL | Status: DC
  Administered 2019-08-25: 1 via ORAL
  Filled 2019-08-25: qty 1

## 2019-08-25 NOTE — Progress Notes (Signed)
Inpatient Diabetes Program Recommendations  AACE/ADA: New Consensus Statement on Inpatient Glycemic Control (2015)  Target Ranges:  Prepandial:   less than 140 mg/dL      Peak postprandial:   less than 180 mg/dL (1-2 hours)      Critically ill patients:  140 - 180 mg/dL   Lab Results  Component Value Date   GLUCAP 230 (H) 08/25/2019   HGBA1C 9.8 (H) 08/23/2019    Review of Glycemic Control Results for Sheila Austin, Sheila Austin (MRN [id number]) as of 08/25/2019 10:41  Ref. Range 08/24/2019 07:44 08/24/2019 12:24 08/24/2019 17:20 08/24/2019 21:37 08/25/2019 08:46  Glucose-Capillary Latest Ref Range: 70 - 99 mg/dL 190 (H) 187 (H) 216 (H) 112 (H) 230 (H)  Diabetes history: DM 2 diagnosed as a child Outpatient Diabetes medications: Lantus 40, Novolog sliding scale tid up to 20 units Current orders for Inpatient glycemic control:  Lantus 21 units Novolog 0-15 units tid  Novolog 7 units tid meal coverage  Inpatient Diabetes Program Recommendations:    Fasting CBG> goal. May need increase in basal insulin.   Thanks  Adah Perl, RN, BC-ADM Inpatient Diabetes Coordinator Pager [phone] (8a-5p)

## 2019-08-25 NOTE — Progress Notes (Signed)
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 Duda of the right leg infection/osteomyelitis came into the hospital with right lower extremity pain, associated with intractable nausea and vomiting.  Patient tells me that she uses the right stump as pivot point and support, and has been doing that for a while, however about a week ago she developed 2 small wounds adjacent to her right knee.  The wounds have gotten bigger and looks like pus was coming out few days ago.  She also states that she was picking on the scab.  She started developing severe pain in the area, worse with movement, redness appeared and started to spread, her leg became more swollen and decided to come to the hospital.  She also reports fever and chills along with poor appetite.  Subjective / 24h Interval events: States today at her knee pain has improved.  Denies any fever or chills.  Reports significant nausea this morning.  Assessment & Plan: Principal Problem Sepsis due to cellulitis/wounds overlying right knee-she met sepsis criteria on admission with tachycardia, leukocytosis, lactic acidosis.  Patient was placed on broad-spectrum antibiotics with vancomycin, metronidazole and ceftriaxone, continue for now.  Blood cultures were obtained and continue to monitor -Lactic acid initially increased however improved after IV fluids, now normalized -X-ray of the right lower extremity revealed no evidence of osteomyelitis and ultrasound showed soft tissue edema without abscess.  She seems to be gradually improving on antibiotics, hold off on obtaining MRI, if she fails to respond this can be considered -MRSA PCR positive  Active Problems Insulin-dependent type 2  diabetes mellitus -Patient CBG was in the 400s when she came into the hospital and was placed initially on a insulin infusion.  She did not seem to have a anion gap and she was not acidotic.  She was transitioned off of the insulin infusion and currently she is on home regimen of Lantus along with NovoLog.  Continue current regimen, CBGs as below.  CBG (last 3)  Recent Labs    08/24/19 1720 08/24/19 2137 08/25/19 0846  GLUCAP 216* 112* 230*    Nausea/vomiting -Possibly in the setting of sepsis along with hyperglycemia, allow clear liquid diet and provide supportive treatment with IV fluids and antiemetics.  Has persistent symptoms today, not ready to advance diet yet.  Acute kidney injury -Her creatinine has been within normal limits in 2020, however in 2021 has been progressively higher around 1.3-1.4, I wonder whether she is developing a degree of chronic kidney disease.  Only time will tell. -Creatinine is 1.3 this morning, keep on IV fluids but decrease the rate and encourage p.o. intake as tolerated  Hypertensive urgency -Significantly elevated blood pressure on admission 205/114 likely due to intractable nausea and vomiting as well as inability to take oral meds.  Patient will be placed on as needed labetalol, treating pain along with nausea and vomiting will help the blood pressure, however still persistently hypertensive this morning and I have added Norvasc.  Scheduled Meds: . amLODipine  10 mg Oral Daily  . insulin aspart  0-15 Units Subcutaneous TID AC & HS  . insulin aspart  7 Units Subcutaneous TID WC  . insulin glargine  21 Units Subcutaneous Q0600  . metoCLOPramide (REGLAN) injection  10 mg   Intravenous TID AC  . pantoprazole (PROTONIX) IV  40 mg Intravenous Q24H  . sodium chloride flush  3 mL Intravenous Once   Continuous Infusions: . sodium chloride 125 mL/hr at 08/25/19 0355  . cefTRIAXone (ROCEPHIN)  IV 2 g (08/24/19 1647)  . metronidazole 500 mg (08/25/19 0522)  .  vancomycin 1,000 mg (08/24/19 2219)   PRN Meds:.acetaminophen **OR** acetaminophen, dextrose, enalaprilat, labetalol, morphine injection **OR** morphine injection, ondansetron (ZOFRAN) IV, prochlorperazine  DVT prophylaxis: SCDs, she appears to be allergic to Lovenox Code Status: Full code Family Communication: Discussed with fianc who is present at bedside Patient admitted from: Home Anticipated d/c place: Home Barriers to d/c: Remains with symptomatic nausea and vomiting, unreliable p.o. intake and needs continuous IV antibiotics  Consultants:  None  Procedures:  None   Microbiology  Blood cultures - pending   Antimicrobials: Vancomycin 4/18 >> Ceftriaxone 4/18 >> Metronidazole 4/18 >>   Objective: Vitals:   08/24/19 1719 08/24/19 2213 08/25/19 0853 08/25/19 0900  BP: 139/75 (!) 173/101 (!) 203/99 (!) 207/118  Pulse: (!) 102 (!) 103 (!) 108   Resp: _0 Temp: 98.6 F (37 C) 98.5 F (36.9 C) 99.4 F (37.4 C)   TempSrc:  Oral    SpO2: 94% 98% 100%   Weight:      Height:        Intake/Output Summary (Last 24 hours) at 08/25/2019 0919 Last data filed at 08/25/2019 0800 Gross per 24 hour  Intake 1400 ml  Output --  Net 1400 ml   Filed Weights   08/23/19 1452  Weight: 102.1 kg    Examination: Constitutional: Appears more comfortable Eyes: No scleral icterus ENMT: Moist external drains Neck: normal, supple Respiratory: Clear bilaterally without wheezing or crackles Cardiovascular: RRR, no MRG, no edema  Abdomen: NT, ND, BS+ Musculoskeletal: no clubbing / cyanosis.  Skin: 2 lesions on the anterior right knee, please see picture on the ED note, cellulitis improving  Neurologic: non focal    Data Reviewed: I have independently reviewed following labs and imaging studies   CBC: Recent Labs  Lab 08/23/19 1511 08/25/19 0538  WBC 19.7* 14.4*  NEUTROABS 17.5*  --   HGB 11.1* 10.9*  HCT 33.1* 32.3*  MCV 85.1 83.7  PLT 431* 454   Basic Metabolic  Panel: Recent Labs  Lab 08/23/19 1511 08/24/19 0045 08/25/19 0538  NA 133* 138 135  K 3.7 3.4* 3.4*  CL 93* 99 98  CO2 _1 GLUCOSE 433* 191* 225*  BUN 27* 27* 20  CREATININE 1.54* 1.62* 1.31*  CALCIUM 9.0 8.4* 8.2*  MG  --   --  1.7  PHOS  --   --  2.8   Liver Function Tests: Recent Labs  Lab 08/23/19 1511 08/25/19 0538  AST 15 24  ALT 14 11  ALKPHOS 98 98  BILITOT 1.0 0.8  PROT 7.5 7.0  ALBUMIN 2.5* 2.4*   Coagulation Profile: Recent Labs  Lab 08/23/19 1845  INR 1.1   HbA1C: Recent Labs    08/23/19 1845  HGBA1C 9.8*   CBG: Recent Labs  Lab 08/24/19 0744 08/24/19 1224 08/24/19 1720 08/24/19 2137 08/25/19 0846  GLUCAP 190* 187* 216* 112* 230*    Recent Results (from the past 240 hour(s))  Blood Culture (routine x 2)     Status: None (Preliminary result)   Collection Time: 08/23/19  6:45 PM   Specimen: BLOOD  Result Value Ref Range Status   Specimen Description BLOOD  RIGHT ANTECUBITAL  Final   Special Requests   Final    BOTTLES DRAWN AEROBIC AND ANAEROBIC Blood Culture adequate volume   Culture   Final    NO GROWTH < 24 HOURS Performed at [facility], [address] [place], [place] [address]    Report Status PENDING  Incomplete  Urine culture     Status: Abnormal   Collection Time: 08/23/19  8:45 PM   Specimen: In/Out Cath Urine  Result Value Ref Range Status   Specimen Description IN/OUT CATH URINE  Final   Special Requests   Final    NONE Performed at [facility], [place] [address] [place], [place] [address]    Culture MULTIPLE SPECIES PRESENT, SUGGEST RECOLLECTION (A)  Final   Report Status 08/25/2019 FINAL  Final  SARS CORONAVIRUS 2 (TAT 6-24 HRS) Nasopharyngeal Nasopharyngeal Swab     Status: None   Collection Time: 08/23/19  9:00 PM   Specimen: Nasopharyngeal Swab  Result Value Ref Range Status   SARS Coronavirus 2 NEGATIVE NEGATIVE Final    Comment: (NOTE) SARS-CoV-2 target nucleic acids are NOT DETECTED. The  SARS-CoV-2 RNA is generally detectable in upper and lower respiratory specimens during the acute phase of infection. Negative results do not preclude SARS-CoV-2 infection, do not rule out co-infections with other pathogens, and should not be used as the sole basis for treatment or other patient management decisions. Negative results must be combined with clinical observations, patient history, and epidemiological information. The expected result is Negative. Fact Sheet for Patients: SugarRoll.be Fact Sheet for Healthcare Providers: https://www.woods-mathews.com/ This test is not yet approved or cleared by the [place] FDA and  has been authorized for detection and/or diagnosis of SARS-CoV-2 by FDA under an Emergency Use Authorization (EUA). This EUA will remain  in effect (meaning this test can be used) for the duration of the COVID-19 declaration under Section 56 4(b)(1) of the Act, 21 U.S.C. section 360bbb-3(b)(1), unless the authorization is terminated or revoked sooner. Performed at [facility], [place] [address] [place], [place] [address]   MRSA PCR Screening     Status: Abnormal   Collection Time: 08/24/19 12:50 PM   Specimen: Nasopharyngeal  Result Value Ref Range Status   MRSA by PCR POSITIVE (A) NEGATIVE Final    Comment:        The GeneXpert MRSA Assay (FDA approved for NASAL specimens only), is one component of a comprehensive MRSA colonization surveillance program. It is not intended to diagnose MRSA infection nor to guide or monitor treatment for MRSA infections. RESULT CALLED TO, READ BACK BY AND VERIFIED WITH: RN L [facility] AT 1157 08/24/19 BY L BENFIELD Performed at [facility], [place] [address], [place], [place] [address]      Radiology Studies: DG Abd 2 Views  Result Date: 08/24/2019 CLINICAL DATA:  Nausea and vomiting for the past 2 days. EXAM: ABDOMEN - 2 VIEW COMPARISON:  Abdomen and pelvis CT dated  02/01/2019 FINDINGS: Normal bowel gas pattern without free peritoneal air. Unremarkable bones. IMPRESSION: Normal examination. Electronically Signed   By: Claudie Revering M.D.   On: 08/24/2019 10:22   Marzetta Board, MD, PhD [facility]  Between 7 am - 7 pm I am available, please contact me via Amion or Securechat  Between 7 pm - 7 am I am not available, please contact night coverage MD/APP via Amion

## 2019-08-25 NOTE — Progress Notes (Signed)
At change of shift patient requested boyfriend to stay at bedside overnight. My self and outgoing nurse Curt Bears informed patient that per hospital policy boyfriend cannot stay overnight and that visitation starts from 7 am to 8 pm each day. Patient then stated " I will like to leave AMA" . Charge nurse informed. AMA form given to patient to sign by charge nurse. Patient wheeled off unit by boyfriend,

## 2019-08-25 NOTE — Plan of Care (Signed)

## 2019-08-25 NOTE — Discharge Instructions (Signed)

## 2019-08-25 NOTE — Progress Notes (Signed)
MD notified of elevated bp 203/99 and 207/118 after rechecking 3 times and obtaining new bp cuff. PRN Labetolol given to pt and MD added new orders for oral bp meds. Will recheck bp.

## 2019-08-25 NOTE — Progress Notes (Signed)
RN changed pt's dressing per MD orders.

## 2019-08-25 NOTE — Progress Notes (Signed)
Initial Nutrition Assessment  DOCUMENTATION CODES:   Morbid obesity  INTERVENTION:   -30 ml Prostat TID, each supplement provides 100 kcals and 15 grams protein -MVI with minerals daily -Attached "Carbohydrate Counting for People with Diabetes" handout from [facility] An Nutrition Care Manual  NUTRITION DIAGNOSIS:   Inadequate oral intake related to nausea, vomiting, decreased appetite as evidenced by meal completion < 25%.  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 Sharol Given of the right leg infection/osteomyelitis came into the hospital with right lower extremity pain, associated with intractable nausea and vomiting.  Patient tells me that she uses the right stump as pivot point and support, and has been doing that for a while, however about a week ago she developed 2 small wounds adjacent to her right knee.  The wounds have gotten bigger and looks like pus was coming out few days ago.  She also states that she was picking on the scab.  She started developing severe pain in the area, worse with movement, redness appeared and started to spread, her leg became more swollen and decided to come to the hospital.  She also reports fever and chills along with poor appetite.  Pt admitted with sepsis secondary to cellulitis.   Reviewed I/O's: +1.1 L x 24 hours and +1.4 L since admission  Pt sleeping soundly at time of visit. She did not arouse to voice or touch.   Per chart review, pt currently on clear liquid diet secondary to nausea and vomiting. She does not feel like she can tolerate diet advancement at this time. Noted meal completion 0%.    Reviewed wt hx; wt has been stable over the past 4 months.  Medications reviewed and include reglan.   Lab Results  Component Value Date   HGBA1C 9.8 (H) 08/23/2019   PTA DM medications are 14 units inuslin isophane and regular insulin BID. Per DM coordinator note, pt just recently had her insurance re-instated and was rationing insulin doses due to financial restraints; highly suspect that this is largely contributing to elevated Hgb A1c and and hyperglycemia.   Labs reviewed: K: 3.4, CBGSL 112-230 (inpatient orders for glycemic control are 0-15 units insulin aspart TID with q HS, 7 units insulin aspart TID with meals, and 21 units insulin glargine daily).   NUTRITION - FOCUSED PHYSICAL EXAM:    Most Recent Value  Orbital Region  No depletion  Upper Arm Region  No depletion  Thoracic and Lumbar Region  No depletion  Buccal Region  No depletion  Temple Region  No depletion  Clavicle Bone Region  No depletion  Clavicle and Acromion Bone Region  No depletion  Scapular Bone Region  No depletion  Dorsal Hand  No depletion  Patellar Region  No depletion  Anterior Thigh Region  No depletion  Posterior Calf Region  No depletion  Edema (RD Assessment)  None  Hair  Reviewed  Eyes  Reviewed  Mouth  Reviewed  Skin  Reviewed  Nails  Reviewed       Diet Order:   Diet Order            Diet clear liquid Room service appropriate?  Yes; Fluid consistency: Thin  Diet effective now              EDUCATION NEEDS:   Not appropriate for education at this time  Skin:  Skin Assessment: Skin Integrity Issues: Skin Integrity Issues:: Other (Comment) Other: partial thickness wounds to rt BKA site  Last BM:  08/23/19  Height:   Ht Readings from Last 1 Encounters:  08/23/19 5\' 2"  (1.575 m)    Weight:   Wt Readings from Last 1 Encounters:  08/23/19 102.1 kg    Ideal Body Weight:  46.8 kg(adjusted for rt BKA)  BMI:  Body mass index is 41.15 kg/m.  Estimated Nutritional  Needs:   Kcal:  1700-1900  Protein:  100-115 grams  Fluid:  > 1.7 L    Loistine Chance, RD, LDN, [place] Registered Dietitian II Certified Diabetes Care and Education Specialist Please refer to [facility] for RD and/or RD on-call/weekend/after hours pager

## 2019-08-26 ENCOUNTER — Encounter: Payer: Self-pay | Admitting: Registered Nurse

## 2019-08-26 NOTE — Discharge Summary (Signed)
Physician Leming discharge Summary  Sheila Austin SFK:[id number] DOB: [birth year] DOA: 08/23/2019  PCP: Maximiano Coss, NP  Admit date: 08/23/2019 Discharge date: 08/26/2019  Admitted From: Home Disposition: Unknown  Recommendations for Outpatient Follow-up:  Follow-up with PCP ASAP  Home Health: None Equipment/Devices: None  Discharge Condition: Guarded, patient left AMA CODE STATUS: Full code Diet recommendation:2 Diabetic  HPI: Per admitting MD, 34 year old female with past medical history of medical noncompliance, diabetes mellitus type 2, hypertension, gastroesophageal reflux disease, diabetic gastroparesis, status post right BKA (01/2019) after infectious complications of the right leg who presents to [facility] emergency department with complaints of right lower extremity pain and intractable nausea and vomiting. Patient explains that approximately 1 week ago she developed 2 small wounds adjacent to her right knee.  She is unsure as to how she got them.  She states that in the days that followed one of the wounds began to express pus "and look really infected."  The patient then began to regularly pick the scab off but in the days that followed patient began to develop substantial pain at the site.  Patient describes the pain as 7 out of 10 intensity, sharp in quality and radiating proximally.  Pain is worse with movement of the lower extremity.  As the days per continue to progress the redness continue to worsen and this was associated with increasing swelling of the right lower extremity. Patient states that has her symptoms continue to progress she began to develop generalized weakness, chills and poor appetite.  Approximately 2 days prior to her presentation she also began to develop intense nausea and frequent bouts of vomiting.  Patient describes the vomiting as nonbilious and nonbloody, occurring excess of 12 times daily. Patient symptoms continue to  worsen until she eventually presented to [facility] emergency department for evaluation. Upon evaluation in the emergency department patient was found to have hypertensive urgency, multiple sirs criteria and clinical concerns for a right lower extremity cellulitis.  Furthermore, patient was found to continue to exhibit intractable nausea and vomiting.  The hospitalist group was then called to assess the patient for admission the hospital.  Hospital Course / Discharge diagnoses: Sepsis due to cellulitis/wounds overlying right knee-she has a history of DKA in 2020 by Dr. Sharol Given.  She met sepsis criteria on admission with tachycardia, leukocytosis, lactic acidosis.  Patient was placed on broad-spectrum antibiotics with vancomycin, metronidazole and ceftriaxone. Blood cultures were obtained and continue to monitor. Lactic acid initially increased however improved after IV fluids, now normalized. X-ray of the right lower extremity revealed no evidence of osteomyelitis and ultrasound showed soft tissue edema without abscess.    Dr. Sharol Given consulted, however patient left AMA before being evaluated  Active Problems Insulin-dependent type 2 diabetes mellitus -Patient CBG was in the 400s when she came into the hospital and was placed initially on a insulin infusion.  She did not seem to have a anion gap and she was not acidotic.  She was transitioned off of the insulin infusion and currently she is on home regimen of Lantus along with NovoLog. Nausea/vomiting -Possibly in the setting of sepsis along with hyperglycema Acute kidney injury -Her creatinine has been within normal limits in 2020, however in 2021 has been progressively higher around 1.3-1.4, I wonder whether she is developing a degree of chronic kidney disease.  Only time will tell Hypertensive urgency -Significantly elevated blood pressure on admission 205/114 likely due to intractable nausea and vomiting as well as  inability to take oral meds.    Disposition-after hours, patient left AGAINST MEDICAL ADVICE before this MD could discuss with her.  Apparently patient's boyfriend requested to stay overnight however it is against [facility] policy.  They were informed, and per RN the patient decided to leave  [facility]  Discharge Instructions   Allergies as of 08/25/2019      Reactions   Bee Venom Anaphylaxis, Hives, Shortness Of Breath, Swelling   Lovenox [enoxaparin Sodium] Other (See Comments)   Hyperkalemia?   Penicillins Itching   Has patient had a PCN reaction causing immediate rash, facial/tongue/throat swelling, SOB or lightheadedness with hypotension: yes Has patient had a PCN reaction causing severe rash involving mucus membranes or skin necrosis: unknown Has patient had a PCN reaction that required hospitalization : yes Has patient had a PCN reaction occurring within the last 10 years: no If all of the above answers are "NO", then may proceed with Cephalosporin use.      Medication List    You have not been prescribed any medications.      Consultations:    Procedures/Studies: DG Chest Port 1 View  Result Date: 08/23/2019 CLINICAL DATA:  Sepsis. Nausea and vomiting. EXAM: PORTABLE CHEST 1 VIEW COMPARISON:  Radiograph 05/16/2019 FINDINGS: Lung volumes are low.The cardiomediastinal contours are normal. Pulmonary vasculature is normal. No consolidation, pleural effusion, or pneumothorax. No acute osseous abnormalities are seen. IMPRESSION: Low lung volumes without acute abnormality. Electronically Signed   By: Keith Rake M.D.   On: 08/23/2019 18:51   DG Knee Complete 4 Views Right  Result Date: 08/23/2019 CLINICAL DATA:  Purulence drainage from right knee wound. EXAM: RIGHT KNEE - COMPLETE 4+ VIEW COMPARISON:  Radiograph 04/19/2019 FINDINGS: Below the knee amputation. Resection margins are sharp. No erosion or periosteal reaction. Stable heterotopic ossification. Knee joint is unremarkable. Mild soft tissue edema. No soft  tissue air. IMPRESSION: Below the knee amputation. No radiographic evidence of osteomyelitis. Mild soft tissue edema. No soft tissue air. Electronically Signed   By: Keith Rake M.D.   On: 08/23/2019 18:52   DG Abd 2 Views  Result Date: 08/24/2019 CLINICAL DATA:  Nausea and vomiting for the past 2 days. EXAM: ABDOMEN - 2 VIEW COMPARISON:  Abdomen and pelvis CT dated 02/01/2019 FINDINGS: Normal bowel gas pattern without free peritoneal air. Unremarkable bones. IMPRESSION: Normal examination. Electronically Signed   By: Claudie Revering M.D.   On: 08/24/2019 10:22   [place] RT LOWER EXTREM LTD SOFT TISSUE NON VASCULAR  Result Date: 08/23/2019 CLINICAL DATA:  Evaluate for abscess.  Knee swelling. EXAM: ULTRASOUND RIGHT LOWER EXTREMITY LIMITED TECHNIQUE: Ultrasound examination of the lower extremity soft tissues was performed in the area of clinical concern. COMPARISON:  None. FINDINGS: Edema noted within the soft tissues in the area concern. No focal fluid collection to suggest abscess. IMPRESSION: Soft tissue edema.  No focal abscess. Electronically Signed   By: Rolm Baptise M.D.   On: 08/23/2019 23:00      The results of significant diagnostics from this hospitalization (including imaging, microbiology, ancillary and laboratory) are listed below for reference.     Microbiology: Recent Results (from the past 240 hour(s))  Blood Culture (routine x 2)     Status: None (Preliminary result)   Collection Time: 08/23/19  6:45 PM   Specimen: BLOOD  Result Value Ref Range Status   Specimen Description BLOOD RIGHT ANTECUBITAL  Final   Special Requests   Final    BOTTLES DRAWN AEROBIC AND ANAEROBIC Blood  Culture adequate volume   Culture   Final    NO GROWTH 3 DAYS Performed at [facility], [place] [address], [place], [place] [address]    Report Status PENDING  Incomplete  Urine culture     Status: Abnormal   Collection Time: 08/23/19  8:45 PM   Specimen: In/Out Cath Urine  Result Value Ref  Range Status   Specimen Description IN/OUT CATH URINE  Final   Special Requests   Final    NONE Performed at [facility], [place] [address], [place], [place] [address]    Culture MULTIPLE SPECIES PRESENT, SUGGEST RECOLLECTION (A)  Final   Report Status 08/25/2019 FINAL  Final  SARS CORONAVIRUS 2 (TAT 6-24 HRS) Nasopharyngeal Nasopharyngeal Swab     Status: None   Collection Time: 08/23/19  9:00 PM   Specimen: Nasopharyngeal Swab  Result Value Ref Range Status   SARS Coronavirus 2 NEGATIVE NEGATIVE Final    Comment: (NOTE) SARS-CoV-2 target nucleic acids are NOT DETECTED. The SARS-CoV-2 RNA is generally detectable in upper and lower respiratory specimens during the acute phase of infection. Negative results do not preclude SARS-CoV-2 infection, do not rule out co-infections with other pathogens, and should not be used as the sole basis for treatment or other patient management decisions. Negative results must be combined with clinical observations, patient history, and epidemiological information. The expected result is Negative. Fact Sheet for Patients: SugarRoll.be Fact Sheet for Healthcare Providers: https://www.woods-mathews.com/ This test is not yet approved or cleared by the [place] FDA and  has been authorized for detection and/or diagnosis of SARS-CoV-2 by FDA under an Emergency Use Authorization (EUA). This EUA will remain  in effect (meaning this test can be used) for the duration of the COVID-19 declaration under Section 56 4(b)(1) of the Act, 21 U.S.C. section 360bbb-3(b)(1), unless the authorization is terminated or revoked sooner. Performed at [facility], [place] [address] [place], [place] [address]   MRSA PCR Screening     Status: Abnormal   Collection Time: 08/24/19 12:50 PM   Specimen: Nasopharyngeal  Result Value Ref Range Status   MRSA by PCR POSITIVE (A) NEGATIVE Final    Comment:        The  GeneXpert MRSA Assay (FDA approved for NASAL specimens only), is one component of a comprehensive MRSA colonization surveillance program. It is not intended to diagnose MRSA infection nor to guide or monitor treatment for MRSA infections. RESULT CALLED TO, READ BACK BY AND VERIFIED WITH: RN L [facility] AT 1950 08/24/19 BY L BENFIELD Performed at [facility], [place] [address], [place], [place] [address]      Labs: Basic Metabolic Panel: Recent Labs  Lab 08/23/19 1511 08/24/19 0045 08/25/19 0538  NA 133* 138 135  K 3.7 3.4* 3.4*  CL 93* 99 98  CO2 _0 GLUCOSE 433* 191* 225*  BUN 27* 27* 20  CREATININE 1.54* 1.62* 1.31*  CALCIUM 9.0 8.4* 8.2*  MG  --   --  1.7  PHOS  --   --  2.8   Liver Function Tests: Recent Labs  Lab 08/23/19 1511 08/25/19 0538  AST 15 24  ALT 14 11  ALKPHOS 98 98  BILITOT 1.0 0.8  PROT 7.5 7.0  ALBUMIN 2.5* 2.4*   CBC: Recent Labs  Lab 08/23/19 1511 08/25/19 0538  WBC 19.7* 14.4*  NEUTROABS 17.5*  --   HGB 11.1* 10.9*  HCT 33.1* 32.3*  MCV 85.1  83.7  PLT 431* 331   CBG: Recent Labs  Lab 08/24/19 1720 08/24/19 2137 08/25/19 0846 08/25/19 1322 08/25/19 1658  GLUCAP 216* 112* 230* 150* 110*   Hgb A1c Recent Labs    08/23/19 1845  HGBA1C 9.8*   Lipid Profile No results for input(s): CHOL, HDL, LDLCALC, TRIG, CHOLHDL, LDLDIRECT in the last 72 hours. Thyroid function studies No results for input(s): TSH, T4TOTAL, T3FREE, THYROIDAB in the last 72 hours.  Invalid input(s): FREET3 Urinalysis    Component Value Date/Time   COLORURINE YELLOW 08/23/2019 2045   APPEARANCEUR CLEAR 08/23/2019 2045   LABSPEC 1.015 08/23/2019 2045   PHURINE 7.0 08/23/2019 2045   GLUCOSEU >=500 (A) 08/23/2019 2045   HGBUR LARGE (A) 08/23/2019 2045   BILIRUBINUR NEGATIVE 08/23/2019 2045   BILIRUBINUR negative 08/13/2017 1622   KETONESUR 15 (A) 08/23/2019 2045   PROTEINUR >300 (A) 08/23/2019 2045   UROBILINOGEN 0.2 08/13/2017 1622    UROBILINOGEN 0.2 11/25/2014 0553   NITRITE NEGATIVE 08/23/2019 2045   LEUKOCYTESUR NEGATIVE 08/23/2019 2045    FURTHER DISCHARGE INSTRUCTIONS:   Get Medicines reviewed and adjusted: Please take all your medications with you for your next visit with your Primary MD   Laboratory/radiological data: Please request your Primary MD to go over all hospital tests and procedure/radiological results at the follow up, please ask your Primary MD to get all Hospital records sent to his/her office.   In some cases, they will be blood work, cultures and biopsy results pending at the time of your discharge. Please request that your primary care M.D. goes through all the records of your hospital data and follows up on these results.   Also Note the following: If you experience worsening of your admission symptoms, develop shortness of breath, life threatening emergency, suicidal or homicidal thoughts you must seek medical attention immediately by calling 911 or calling your MD immediately  if symptoms less severe.   You must read complete instructions/literature along with all the possible adverse reactions/side effects for all the Medicines you take and that have been prescribed to you. Take any new Medicines after you have completely understood and accpet all the possible adverse reactions/side effects.    Do not drive when taking Pain medications or sleeping medications (Benzodaizepines)   Do not take more than prescribed Pain, Sleep and Anxiety Medications. It is not advisable to combine anxiety,sleep and pain medications without talking with your primary care practitioner   Special Instructions: If you have smoked or chewed Tobacco  in the last 2 yrs please stop smoking, stop any regular Alcohol  and or any Recreational drug use.   Wear Seat belts while driving.   Please note: You were cared for by a hospitalist during your hospital stay. Once you are discharged, your primary care physician will  handle any further medical issues. Please note that NO REFILLS for any discharge medications will be authorized once you are discharged, as it is imperative that you return to your primary care physician (or establish a relationship with a primary care physician if you do not have one) for your post hospital discharge needs so that they can reassess your need for medications and monitor your lab values.  Time coordinating discharge: 10 minutes  SIGNED:  Marzetta Board, MD, PhD 08/26/2019, 6:25 PM

## 2019-08-28 LAB — CULTURE, BLOOD (ROUTINE X 2)
Culture: NO GROWTH
Special Requests: ADEQUATE

## 2019-10-09 ENCOUNTER — Observation Stay (HOSPITAL_COMMUNITY)
Admission: EM | Admit: 2019-10-09 | Discharge: 2019-10-10 | Discharge status: Against Medical Advice | Payer: Medicaid Other | Attending: Internal Medicine | Admitting: Internal Medicine

## 2019-10-09 ENCOUNTER — Emergency Department (HOSPITAL_COMMUNITY): Payer: Medicaid Other

## 2019-10-09 ENCOUNTER — Encounter (HOSPITAL_COMMUNITY): Payer: Self-pay

## 2019-10-09 DIAGNOSIS — Z6841 Body Mass Index (BMI) 40.0 and over, adult: Secondary | ICD-10-CM | POA: Diagnosis not present

## 2019-10-09 DIAGNOSIS — E1143 Type 2 diabetes mellitus with diabetic autonomic (poly)neuropathy: Secondary | ICD-10-CM | POA: Diagnosis not present

## 2019-10-09 DIAGNOSIS — K760 Fatty (change of) liver, not elsewhere classified: Secondary | ICD-10-CM | POA: Diagnosis not present

## 2019-10-09 DIAGNOSIS — E876 Hypokalemia: Secondary | ICD-10-CM | POA: Diagnosis present

## 2019-10-09 DIAGNOSIS — Z9114 Patient's other noncompliance with medication regimen: Secondary | ICD-10-CM | POA: Insufficient documentation

## 2019-10-09 DIAGNOSIS — R112 Nausea with vomiting, unspecified: Secondary | ICD-10-CM | POA: Diagnosis present

## 2019-10-09 DIAGNOSIS — K3184 Gastroparesis: Secondary | ICD-10-CM

## 2019-10-09 DIAGNOSIS — Z794 Long term (current) use of insulin: Secondary | ICD-10-CM | POA: Insufficient documentation

## 2019-10-09 DIAGNOSIS — N183 Chronic kidney disease, stage 3 unspecified: Secondary | ICD-10-CM | POA: Diagnosis not present

## 2019-10-09 DIAGNOSIS — I252 Old myocardial infarction: Secondary | ICD-10-CM | POA: Diagnosis not present

## 2019-10-09 DIAGNOSIS — E785 Hyperlipidemia, unspecified: Secondary | ICD-10-CM | POA: Insufficient documentation

## 2019-10-09 DIAGNOSIS — I160 Hypertensive urgency: Secondary | ICD-10-CM | POA: Diagnosis present

## 2019-10-09 DIAGNOSIS — E43 Unspecified severe protein-calorie malnutrition: Secondary | ICD-10-CM | POA: Insufficient documentation

## 2019-10-09 DIAGNOSIS — Z79899 Other long term (current) drug therapy: Secondary | ICD-10-CM | POA: Diagnosis not present

## 2019-10-09 DIAGNOSIS — E1165 Type 2 diabetes mellitus with hyperglycemia: Secondary | ICD-10-CM | POA: Diagnosis present

## 2019-10-09 DIAGNOSIS — E871 Hypo-osmolality and hyponatremia: Secondary | ICD-10-CM | POA: Diagnosis present

## 2019-10-09 DIAGNOSIS — I129 Hypertensive chronic kidney disease with stage 1 through stage 4 chronic kidney disease, or unspecified chronic kidney disease: Secondary | ICD-10-CM | POA: Diagnosis not present

## 2019-10-09 DIAGNOSIS — D72829 Elevated white blood cell count, unspecified: Secondary | ICD-10-CM | POA: Diagnosis not present

## 2019-10-09 DIAGNOSIS — Z89511 Acquired absence of right leg below knee: Secondary | ICD-10-CM | POA: Insufficient documentation

## 2019-10-09 DIAGNOSIS — K219 Gastro-esophageal reflux disease without esophagitis: Secondary | ICD-10-CM | POA: Insufficient documentation

## 2019-10-09 DIAGNOSIS — E669 Obesity, unspecified: Secondary | ICD-10-CM | POA: Diagnosis present

## 2019-10-09 DIAGNOSIS — Z20822 Contact with and (suspected) exposure to covid-19: Secondary | ICD-10-CM | POA: Insufficient documentation

## 2019-10-09 DIAGNOSIS — R739 Hyperglycemia, unspecified: Secondary | ICD-10-CM

## 2019-10-09 DIAGNOSIS — E1122 Type 2 diabetes mellitus with diabetic chronic kidney disease: Secondary | ICD-10-CM | POA: Insufficient documentation

## 2019-10-09 LAB — COMPREHENSIVE METABOLIC PANEL
ALT: 13 U/L (ref 0–44)
AST: 18 U/L (ref 15–41)
Albumin: 2.3 g/dL — ABNORMAL LOW (ref 3.5–5.0)
Alkaline Phosphatase: 141 U/L — ABNORMAL HIGH (ref 38–126)
Anion gap: 15 (ref 5–15)
BUN: 18 mg/dL (ref 6–20)
CO2: 32 mmol/L (ref 22–32)
Calcium: 7.7 mg/dL — ABNORMAL LOW (ref 8.9–10.3)
Chloride: 84 mmol/L — ABNORMAL LOW (ref 98–111)
Creatinine, Ser: 1.71 mg/dL — ABNORMAL HIGH (ref 0.44–1.00)
GFR calc Af Amer: 45 mL/min — ABNORMAL LOW (ref >60)
GFR calc non Af Amer: 39 mL/min — ABNORMAL LOW (ref >60)
Glucose, Bld: 386 mg/dL — ABNORMAL HIGH (ref 70–99)
Potassium: 2.6 mmol/L — CL (ref 3.5–5.1)
Sodium: 131 mmol/L — ABNORMAL LOW (ref 135–145)
Total Bilirubin: 1 mg/dL (ref 0.3–1.2)
Total Protein: 6.4 g/dL — ABNORMAL LOW (ref 6.5–8.1)

## 2019-10-09 LAB — LIPASE, BLOOD: Lipase: 31 U/L (ref 11–51)

## 2019-10-09 LAB — I-STAT BETA HCG BLOOD, ED (MC, WL, AP ONLY): I-stat hCG, quantitative: <5 m[IU]/mL (ref <5)

## 2019-10-09 LAB — CBC
HCT: 32.4 % — ABNORMAL LOW (ref 36.0–46.0)
Hemoglobin: 11.4 g/dL — ABNORMAL LOW (ref 12.0–15.0)
MCH: 29.1 pg (ref 26.0–34.0)
MCHC: 35.2 g/dL (ref 30.0–36.0)
MCV: 82.7 fL (ref 80.0–100.0)
Platelets: 487 10*3/uL — ABNORMAL HIGH (ref 150–400)
RBC: 3.92 MIL/uL (ref 3.87–5.11)
RDW: 13.2 % (ref 11.5–15.5)
WBC: 21.8 10*3/uL — ABNORMAL HIGH (ref 4.0–10.5)
nRBC: 0 % (ref 0.0–0.2)

## 2019-10-09 MED ORDER — METOCLOPRAMIDE HCL 5 MG/ML IJ SOLN
10.0000 mg | Freq: Once | INTRAMUSCULAR | Status: AC
  Administered 2019-10-09: 10 mg via INTRAVENOUS
  Filled 2019-10-09: qty 2

## 2019-10-09 MED ORDER — SODIUM CHLORIDE 0.9 % IV BOLUS
1000.0000 mL | Freq: Once | INTRAVENOUS | Status: AC
  Administered 2019-10-10: 1000 mL via INTRAVENOUS

## 2019-10-09 MED ORDER — SODIUM CHLORIDE 0.9 % IV BOLUS
1000.0000 mL | Freq: Once | INTRAVENOUS | Status: AC
  Administered 2019-10-09: 1000 mL via INTRAVENOUS

## 2019-10-09 MED ORDER — POTASSIUM CHLORIDE 10 MEQ/100ML IV SOLN
10.0000 meq | INTRAVENOUS | Status: DC
  Administered 2019-10-09 – 2019-10-10 (×2): 10 meq via INTRAVENOUS
  Filled 2019-10-09 (×2): qty 100

## 2019-10-09 MED ORDER — SODIUM CHLORIDE 0.9% FLUSH
3.0000 mL | Freq: Once | INTRAVENOUS | Status: AC
  Administered 2019-10-09: 3 mL via INTRAVENOUS

## 2019-10-09 NOTE — ED Notes (Signed)
Attempted IV x2 without success. CN at bedside to attempt.

## 2019-10-09 NOTE — ED Triage Notes (Signed)
Pt BIB [facility] from home. Reports N/V x 3 days. R leg amputated (BKA) in September. Reports that her stump has some scabbing. No fever. Hx HTN and diabetes. Received 4mg  of zofran IM.

## 2019-10-09 NOTE — ED Provider Notes (Signed)
[facility] DEPT Provider Note   CSN: [id number] Arrival date & time: 10/09/19  1959     History Chief Complaint  Patient presents with  . Nausea  . Emesis    Sheila Austin is a 34 y.o. female history of rgastroparesis, fatty liver, uncontrolled diabetes s/p right DKA, presented to emergency department with nausea and vomiting. Patient reports onset of symptoms several days ago, possibly 3 days.  She feels like it was triggered by her gastroparesis.  She states she has episodes like this virtually every month, sometimes they resolve and sometimes they get worse.  She has not had any insulin or medications due to insurance problems, but recently got medicaide.  She does not yet have care established with a PCP.  Denies diarrhea, dysuria. No fevers or chills at home.  Chart review shows recent hospitalization in April 2021, discharge 08/25/19 with patient leaving AMA after admission for concern for sepsis 2/2 cellulitis/stump infection (blood cultures were negative).  Xrays showed no evidence of osteomyelitis.  She was noted to have an AKI and hypertensive urgency at that time; however she left without any medications prescribed for either.  HPI     Past Medical History:  Diagnosis Date  . Diabetes mellitus    uncontrolled, last A1c was 14  . E. coli sepsis ([place])   . Ectopic pregnancy   . Essential hypertension, benign 11/01/2008  . Fatty liver   . Gastroparesis   . GERD (gastroesophageal reflux disease)   . Helicobacter pylori gastritis 09/07/2011  . Migraine   . Morbid obesity with body mass index (BMI) of 40.0 to 49.9 ([place])   . Pulmonary edema   . Pyelonephritis   . Ureteral obstruction     Patient Active Problem List   Diagnosis Date Noted  . Nausea & vomiting 10/10/2019  . Cellulitis of right lower extremity 08/23/2019  . Uncontrolled type 2 diabetes mellitus with hyperglycemia, without long-term current use of insulin ([place]) 08/23/2019    . Intractable nausea and vomiting 08/23/2019  . GERD with esophagitis 08/23/2019  . Lactic acidosis 08/23/2019  . Hyperosmolar hyperglycemic state (HHS) ([place]) 05/16/2019  . AKI (acute kidney injury) ([place]) 05/16/2019  . S/P AKA (above knee amputation), right ([place]) 05/16/2019  . GERD (gastroesophageal reflux disease)   . Hypertensive urgency   . Benign essential HTN   . Unilateral complete BKA, right, sequela ([place])   . Unilateral complete BKA, right, initial encounter ([place]) 02/09/2019  . Acquired absence of right leg below knee ([place])   . Hyponatremia   . Acute blood loss anemia   . Sinus tachycardia   . Post-operative pain   . Bacteremia   . 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 Streptococcal bacteremia   . Klebsiella pneumoniae infection   . Abscess of leg, right   . Skin ulcer of right foot with necrosis of muscle ([place])   . Severe protein-calorie malnutrition ([place])   . DM (diabetes mellitus) ([place]) 02/01/2019  . Adjustment disorder 02/01/2019  . Sepsis due to cellulitis ([place]) 02/01/2019  . Cellulitis in diabetic foot ([place]) 12/31/2018  . Obesity 12/31/2018  . Facial cellulitis 07/02/2018  . Class 3 severe obesity with body mass index (BMI) of 40.0 to 44.9 in adult ([place]) 07/09/2017  . Abscess of right breast 02/24/2016  . BMI 45.0-49.9, adult ([place]) 09/29/2015  . Skin lesions - right lower extremity 12/07/2013  . Morbid obesity with BMI of 50.0-59.9, adult ([place]) 10/05/2011  . Obstructive sleep  apnea 10/05/2011  . HLD (hyperlipidemia) 09/27/2011  . Migraine 09/07/2011  . Essential hypertension, benign 11/01/2008  . Gastroparesis 09/27/2008  . IRREGULAR MENSES 08/24/2008  . Diabetes mellitus out of control ([place]) 07/04/2006    Past Surgical History:  Procedure Laterality Date  . AMPUTATION Right 02/04/2019   Procedure: RIGHT BELOW KNEE AMPUTATION;  Surgeon: Newt Minion, MD;  Location: [facility];  Service: Orthopedics;  Laterality: Right;  . ESOPHAGOGASTRODUODENOSCOPY  2010   . WOUND DEBRIDEMENT Right 01/03/2019   Procedure: DEBRIDEMENT WOUND RIGHT LATERAL FOOT;  Surgeon: Angelia Mould, MD;  Location: [facility] OR;  Service: Vascular;  Laterality: Right;     OB History    Gravida  1   Para      Term      Preterm      AB  1   Living  0     SAB      TAB      Ectopic  1   Multiple      Live Births              Family History  Problem Relation Age of Onset  . Arthritis Mother   . Asthma Mother   . COPD Mother   . Depression Mother   . Diabetes Mother   . Hypertension Mother   . Mental illness Mother   . Kidney disease Mother   . Crohn's disease Mother   . Aneurysm Father 66       brain  . Mental illness Sister   . Arthritis Maternal Aunt   . Asthma Maternal Aunt   . COPD Maternal Aunt   . Depression Maternal Aunt   . Diabetes Maternal Aunt   . Hypertension Maternal Aunt   . Mental illness Maternal Aunt   . Stroke Maternal Aunt   . Heart failure Maternal Aunt   . Heart failure Maternal Grandfather   . Cancer Maternal Grandfather   . Diabetes Maternal Grandfather   . Heart disease Maternal Grandfather   . Hypertension Maternal Grandfather   . Hyperlipidemia Maternal Grandfather   . Mental illness Brother   . Diabetes Maternal Grandmother   . Heart disease Maternal Grandmother   . Hypertension Maternal Grandmother   . Hyperlipidemia Maternal Grandmother   . Diabetes Paternal Grandmother   . Heart disease Paternal Grandmother   . Diabetes Paternal Grandfather   . Cancer Paternal Grandfather     Social History   Tobacco Use  . Smoking status: Never Smoker  . Smokeless tobacco: Never Used  Substance Use Topics  . Alcohol use: Not Currently  . Drug use: Yes    Types: Cocaine    Home Medications Prior to Admission medications   Not on File    Allergies    Bee venom, Lovenox [enoxaparin sodium], and Penicillins  Review of Systems   Review of Systems  Constitutional: Negative for chills and fever.  Eyes:  Negative for photophobia and visual disturbance.  Respiratory: Negative for cough and shortness of breath.   Cardiovascular: Negative for chest pain and palpitations.  Gastrointestinal: Positive for nausea and vomiting. Negative for constipation and diarrhea.  Genitourinary: Negative for dysuria and hematuria.  Musculoskeletal: Negative for arthralgias and myalgias.  Skin: Positive for wound. Negative for rash.  Neurological: Positive for light-headedness. Negative for syncope.  All other systems reviewed and are negative.   Physical Exam Updated Vital Signs BP (!) 186/118   Pulse (!) 101   Temp 99 F (37.2 C) (Oral)  Resp 15   SpO2 100%   Physical Exam Vitals and nursing note reviewed.  Constitutional:      General: She is not in acute distress.    Appearance: She is well-developed.  HENT:     Head: Normocephalic and atraumatic.  Eyes:     Conjunctiva/sclera: Conjunctivae normal.  Cardiovascular:     Rate and Rhythm: Normal rate and regular rhythm.     Pulses: Normal pulses.  Pulmonary:     Effort: Pulmonary effort is normal. No respiratory distress.     Breath sounds: Normal breath sounds.  Abdominal:     Palpations: Abdomen is soft.     Tenderness: There is no abdominal tenderness.  Musculoskeletal:     Cervical back: Neck supple.     Comments: Right lower extremity BKA Small scabbed wound on right anterior lower thigh, no significant erythema or fluctuance Amputation incision site looks clean, well healed  Skin:    General: Skin is warm and dry.  Neurological:     General: No focal deficit present.     Mental Status: She is alert and oriented to person, place, and time.  Psychiatric:        Mood and Affect: Mood normal.        Behavior: Behavior normal.     ED Results / Procedures / Treatments   Labs (all labs ordered are listed, but only abnormal results are displayed) Labs Reviewed  COMPREHENSIVE METABOLIC PANEL - Abnormal; Notable for the following  components:      Result Value   Sodium 131 (*)    Potassium 2.6 (*)    Chloride 84 (*)    Glucose, Bld 386 (*)    Creatinine, Ser 1.71 (*)    Calcium 7.7 (*)    Total Protein 6.4 (*)    Albumin 2.3 (*)    Alkaline Phosphatase 141 (*)    GFR calc non Af Amer 39 (*)    GFR calc Af Amer 45 (*)    All other components within normal limits  CBC - Abnormal; Notable for the following components:   WBC 21.8 (*)    Hemoglobin 11.4 (*)    HCT 32.4 (*)    Platelets 487 (*)    All other components within normal limits  SARS CORONAVIRUS 2 BY RT PCR (HOSPITAL ORDER, [facility])  LIPASE, BLOOD  URINALYSIS, ROUTINE W REFLEX MICROSCOPIC  MAGNESIUM  BETA-HYDROXYBUTYRIC ACID  I-STAT BETA HCG BLOOD, ED ([facility], [facility], [facility] ONLY)    EKG EKG Interpretation  Date/Time:  Friday October 09 2019 23:08:39 EDT Ventricular Rate:  97 PR Interval:    QRS Duration: 94 QT Interval:  403 QTC Calculation: 512 R Axis:   51 Text Interpretation: Sinus rhythm Probable left atrial enlargement Anterior infarct, old Prolonged QT interval No STEMI Confirmed by Octaviano Glow [phone]) on 10/09/2019 11:21:34 PM   Radiology DG Chest Portable 1 View  Result Date: 10/09/2019 CLINICAL DATA:  Nausea and vomiting x3 days. EXAM: PORTABLE CHEST 1 VIEW COMPARISON:  August 23, 2019 FINDINGS: The heart size and mediastinal contours are within normal limits. Both lungs are clear. The visualized skeletal structures are unremarkable. IMPRESSION: No active disease. Electronically Signed   By: Virgina Norfolk M.D.   On: 10/09/2019 23:00    Procedures .Critical Care Performed by: Wyvonnia Dusky, MD Authorized by: Wyvonnia Dusky, MD   Critical care provider statement:    Critical care time (minutes):  45   Critical care  was necessary to treat or prevent imminent or life-threatening deterioration of the following conditions:  Metabolic crisis   Critical care was time spent personally by me on the following  activities:  Discussions with consultants, evaluation of patient's response to treatment, examination of patient, ordering and performing treatments and interventions, ordering and review of laboratory studies, ordering and review of radiographic studies, pulse oximetry, re-evaluation of patient's condition, obtaining history from patient or surrogate and review of old charts Comments:     Hypokalemia requiring IV potassium   (including critical care time)  Medications Ordered in ED Medications  hydrALAZINE (APRESOLINE) injection 10 mg (has no administration in time range)  potassium chloride 10 mEq in 100 mL IVPB (has no administration in time range)  sodium chloride flush (NS) 0.9 % injection 3 mL (3 mLs Intravenous Given 10/09/19 2300)  sodium chloride 0.9 % bolus 1,000 mL (0 mLs Intravenous Stopped 10/10/19 0010)  metoCLOPramide (REGLAN) injection 10 mg (10 mg Intravenous Given 10/09/19 2258)  sodium chloride 0.9 % bolus 1,000 mL (1,000 mLs Intravenous New Bag/Given 10/10/19 0010)    ED Course  I have reviewed the triage vital signs and the nursing notes.  Pertinent labs & imaging results that were available during my care of the patient were reviewed by me and considered in my medical decision making (see chart for details).  34 yo female w/ insulin dependent diabetes, not taking insulin for the past month due to financial/insurance reasons, presenting with cyclical vomiting and hyperglycemia.  She feels this is her monthly gastroparesis and states this always triggers "DKA" for her.  She has been vomiting for 3 days.  On exam she had a fairly benign abdominal exam.  Her right lower leg appears to have healed well, only a small scab from her prior reported leg wound.  I have a very low suspicion for cellulitis or osteomyelitis of her stump based on this clinical exam.  Her labs were notable for K+ 2.6, Cr 1.7, BS 386, Anion gap 15.   She also has WBC 21.8, with elevated platelets as well.  I  strongly suspect this is due to hemoconcentration with some reactive leukocytosis from vomiting, and less likely sepsis at this time.  UA is still pending, as well as xray chest, but if these are negative I would consider IV fluids and monitoring/repeat labs in the morning rather than initiating again broad spectrum antibiotics.  Plan to replete K here, add on magnesium level Will need admission for fluids, nausea control (she cannot tolerate PO), electrolyte improvement She now has medicaide and states she can afford medications.  She would benefit from insulin counselling again. Medical chart reviewed including blood cultures and discharge summary from most recent hospitalization  Clinical Course as of Oct 09 56  Fri Oct 09, 2019  2215 Creatinine(!): 1.71 [MT]  2216 Potassium(!!): 2.6 [MT]  2218 Corrected calcium is 8.7, will hold on repletion now   [MT]  2252 Signed out to Dr Nichola Sizer   [MT]    Clinical Course User Index [MT] Langston Masker Carola Rhine, MD    Final Clinical Impression(s) / ED Diagnoses Final diagnoses:  Gastroparesis  Hypokalemia  Hyperglycemia    Rx / DC Orders ED Discharge Orders    None       Wyvonnia Dusky, MD 10/10/19 [phone]

## 2019-10-10 ENCOUNTER — Encounter (HOSPITAL_COMMUNITY): Payer: Self-pay | Admitting: Internal Medicine

## 2019-10-10 ENCOUNTER — Other Ambulatory Visit: Payer: Self-pay

## 2019-10-10 DIAGNOSIS — E876 Hypokalemia: Secondary | ICD-10-CM | POA: Diagnosis present

## 2019-10-10 DIAGNOSIS — R112 Nausea with vomiting, unspecified: Secondary | ICD-10-CM | POA: Diagnosis present

## 2019-10-10 DIAGNOSIS — K3184 Gastroparesis: Secondary | ICD-10-CM

## 2019-10-10 DIAGNOSIS — I160 Hypertensive urgency: Secondary | ICD-10-CM | POA: Diagnosis not present

## 2019-10-10 DIAGNOSIS — N183 Chronic kidney disease, stage 3 unspecified: Secondary | ICD-10-CM

## 2019-10-10 DIAGNOSIS — E871 Hypo-osmolality and hyponatremia: Secondary | ICD-10-CM

## 2019-10-10 DIAGNOSIS — E1165 Type 2 diabetes mellitus with hyperglycemia: Secondary | ICD-10-CM | POA: Diagnosis not present

## 2019-10-10 DIAGNOSIS — N1832 Chronic kidney disease, stage 3b: Secondary | ICD-10-CM

## 2019-10-10 LAB — BASIC METABOLIC PANEL
Anion gap: 9 (ref 5–15)
Anion gap: 12 (ref 5–15)
BUN: 18 mg/dL (ref 6–20)
BUN: 18 mg/dL (ref 6–20)
CO2: 32 mmol/L (ref 22–32)
CO2: 27 mmol/L (ref 22–32)
Calcium: 7.2 mg/dL — ABNORMAL LOW (ref 8.9–10.3)
Calcium: 6.7 mg/dL — ABNORMAL LOW (ref 8.9–10.3)
Chloride: 91 mmol/L — ABNORMAL LOW (ref 98–111)
Chloride: 94 mmol/L — ABNORMAL LOW (ref 98–111)
Creatinine, Ser: 1.61 mg/dL — ABNORMAL HIGH (ref 0.44–1.00)
Creatinine, Ser: 1.86 mg/dL — ABNORMAL HIGH (ref 0.44–1.00)
GFR calc Af Amer: 49 mL/min — ABNORMAL LOW (ref >60)
GFR calc Af Amer: 41 mL/min — ABNORMAL LOW (ref >60)
GFR calc non Af Amer: 42 mL/min — ABNORMAL LOW (ref >60)
GFR calc non Af Amer: 35 mL/min — ABNORMAL LOW (ref >60)
Glucose, Bld: 182 mg/dL — ABNORMAL HIGH (ref 70–99)
Glucose, Bld: 219 mg/dL — ABNORMAL HIGH (ref 70–99)
Potassium: 2.7 mmol/L — CL (ref 3.5–5.1)
Potassium: 3.1 mmol/L — ABNORMAL LOW (ref 3.5–5.1)
Sodium: 132 mmol/L — ABNORMAL LOW (ref 135–145)
Sodium: 133 mmol/L — ABNORMAL LOW (ref 135–145)

## 2019-10-10 LAB — CBC WITH DIFFERENTIAL/PLATELET
Abs Immature Granulocytes: 0.09 10*3/uL — ABNORMAL HIGH (ref 0.00–0.07)
Basophils Absolute: 0.1 10*3/uL (ref 0.0–0.1)
Basophils Relative: 0 %
Eosinophils Absolute: 0.2 10*3/uL (ref 0.0–0.5)
Eosinophils Relative: 1 %
HCT: 31.3 % — ABNORMAL LOW (ref 36.0–46.0)
Hemoglobin: 10.9 g/dL — ABNORMAL LOW (ref 12.0–15.0)
Immature Granulocytes: 1 %
Lymphocytes Relative: 26 %
Lymphs Abs: 3.8 10*3/uL (ref 0.7–4.0)
MCH: 28.9 pg (ref 26.0–34.0)
MCHC: 34.8 g/dL (ref 30.0–36.0)
MCV: 83 fL (ref 80.0–100.0)
Monocytes Absolute: 1.2 10*3/uL — ABNORMAL HIGH (ref 0.1–1.0)
Monocytes Relative: 8 %
Neutro Abs: 9.5 10*3/uL — ABNORMAL HIGH (ref 1.7–7.7)
Neutrophils Relative %: 64 %
Platelets: 346 10*3/uL (ref 150–400)
RBC: 3.77 MIL/uL — ABNORMAL LOW (ref 3.87–5.11)
RDW: 13.4 % (ref 11.5–15.5)
WBC: 14.8 10*3/uL — ABNORMAL HIGH (ref 4.0–10.5)
nRBC: 0 % (ref 0.0–0.2)

## 2019-10-10 LAB — HEPATIC FUNCTION PANEL
ALT: 10 U/L (ref 0–44)
AST: 15 U/L (ref 15–41)
Albumin: 2 g/dL — ABNORMAL LOW (ref 3.5–5.0)
Alkaline Phosphatase: 105 U/L (ref 38–126)
Bilirubin, Direct: <0.1 mg/dL (ref 0.0–0.2)
Total Bilirubin: 0.5 mg/dL (ref 0.3–1.2)
Total Protein: 5.2 g/dL — ABNORMAL LOW (ref 6.5–8.1)

## 2019-10-10 LAB — SARS CORONAVIRUS 2 BY RT PCR (HOSPITAL ORDER, PERFORMED IN ~~LOC~~ HOSPITAL LAB): SARS Coronavirus 2: NEGATIVE

## 2019-10-10 LAB — BETA-HYDROXYBUTYRIC ACID: Beta-Hydroxybutyric Acid: 0.56 mmol/L — ABNORMAL HIGH (ref 0.05–0.27)

## 2019-10-10 LAB — MAGNESIUM: Magnesium: 1.5 mg/dL — ABNORMAL LOW (ref 1.7–2.4)

## 2019-10-10 LAB — CBG MONITORING, ED
Glucose-Capillary: 279 mg/dL — ABNORMAL HIGH (ref 70–99)
Glucose-Capillary: 138 mg/dL — ABNORMAL HIGH (ref 70–99)
Glucose-Capillary: 202 mg/dL — ABNORMAL HIGH (ref 70–99)

## 2019-10-10 LAB — HEMOGLOBIN A1C
Hgb A1c MFr Bld: 9.7 % — ABNORMAL HIGH (ref 4.8–5.6)
Mean Plasma Glucose: 231.69 mg/dL

## 2019-10-10 LAB — GLUCOSE, CAPILLARY: Glucose-Capillary: 138 mg/dL — ABNORMAL HIGH (ref 70–99)

## 2019-10-10 MED ORDER — SODIUM CHLORIDE 0.9 % IV SOLN: INTRAVENOUS | Status: DC

## 2019-10-10 MED ORDER — INSULIN ASPART 100 UNIT/ML ~~LOC~~ SOLN
3.0000 [IU] | Freq: Three times a day (TID) | SUBCUTANEOUS | Status: DC
  Administered 2019-10-10 (×2): 3 [IU] via SUBCUTANEOUS
  Filled 2019-10-10: qty 0.03

## 2019-10-10 MED ORDER — POTASSIUM CHLORIDE 20 MEQ PO PACK
40.0000 meq | PACK | Freq: Three times a day (TID) | ORAL | Status: DC
  Administered 2019-10-10 (×2): 40 meq via ORAL
  Filled 2019-10-10 (×2): qty 2

## 2019-10-10 MED ORDER — INSULIN ASPART 100 UNIT/ML ~~LOC~~ SOLN
0.0000 [IU] | SUBCUTANEOUS | Status: DC
  Administered 2019-10-10: 1 [IU] via SUBCUTANEOUS
  Administered 2019-10-10: 5 [IU] via SUBCUTANEOUS
  Filled 2019-10-10: qty 0.09

## 2019-10-10 MED ORDER — HYDRALAZINE HCL 20 MG/ML IJ SOLN
10.0000 mg | INTRAMUSCULAR | Status: DC | PRN
  Administered 2019-10-10 (×2): 10 mg via INTRAVENOUS
  Filled 2019-10-10 (×2): qty 1

## 2019-10-10 MED ORDER — AMLODIPINE BESYLATE 10 MG PO TABS
10.0000 mg | ORAL_TABLET | Freq: Every day | ORAL | Status: DC
  Administered 2019-10-10: 10 mg via ORAL
  Filled 2019-10-10: qty 2

## 2019-10-10 MED ORDER — INSULIN ASPART 100 UNIT/ML ~~LOC~~ SOLN
0.0000 [IU] | Freq: Three times a day (TID) | SUBCUTANEOUS | Status: DC
  Administered 2019-10-10: 1 [IU] via SUBCUTANEOUS
  Administered 2019-10-10: 3 [IU] via SUBCUTANEOUS
  Filled 2019-10-10: qty 0.09

## 2019-10-10 MED ORDER — ONDANSETRON HCL 4 MG PO TABS: 4.0000 mg | ORAL_TABLET | Freq: Four times a day (QID) | ORAL | Status: DC | PRN

## 2019-10-10 MED ORDER — MAGNESIUM SULFATE 2 GM/50ML IV SOLN
2.0000 g | Freq: Once | INTRAVENOUS | Status: AC
  Administered 2019-10-10: 2 g via INTRAVENOUS
  Filled 2019-10-10: qty 50

## 2019-10-10 MED ORDER — METOCLOPRAMIDE HCL 5 MG PO TABS
5.0000 mg | ORAL_TABLET | Freq: Three times a day (TID) | ORAL | Status: DC
  Administered 2019-10-10: 5 mg via ORAL
  Filled 2019-10-10: qty 1

## 2019-10-10 MED ORDER — LACTATED RINGERS IV SOLN: INTRAVENOUS | Status: DC

## 2019-10-10 MED ORDER — INSULIN GLARGINE 100 UNIT/ML ~~LOC~~ SOLN
20.0000 [IU] | Freq: Every day | SUBCUTANEOUS | Status: DC
  Filled 2019-10-10: qty 0.2

## 2019-10-10 MED ORDER — ONDANSETRON HCL 4 MG/2ML IJ SOLN
4.0000 mg | Freq: Four times a day (QID) | INTRAMUSCULAR | Status: DC | PRN
  Administered 2019-10-10: 4 mg via INTRAVENOUS
  Filled 2019-10-10: qty 2

## 2019-10-10 MED ORDER — LISINOPRIL 10 MG PO TABS
10.0000 mg | ORAL_TABLET | Freq: Every day | ORAL | Status: DC
  Administered 2019-10-10: 10 mg via ORAL
  Filled 2019-10-10: qty 1

## 2019-10-10 MED ORDER — INSULIN ASPART 100 UNIT/ML ~~LOC~~ SOLN
0.0000 [IU] | Freq: Every day | SUBCUTANEOUS | Status: DC
  Filled 2019-10-10: qty 0.05

## 2019-10-10 MED ORDER — POTASSIUM CHLORIDE 10 MEQ/100ML IV SOLN
10.0000 meq | INTRAVENOUS | Status: AC
  Administered 2019-10-10 (×5): 10 meq via INTRAVENOUS
  Filled 2019-10-10 (×5): qty 100

## 2019-10-10 NOTE — H&P (Addendum)
History and Physical    Sheila Austin IRJ:[id number] DOB: 07/24/1985 DOA: 10/09/2019  PCP: Maximiano Coss, NP  Patient coming from: Home.  Chief Complaint: Nausea vomiting.  HPI: Sheila Austin is a 34 y.o. female with history of diabetes mellitus type 2, hypertension noncompliant with her medications due to insurance issues has not been taking any of her medications since discharge on August 25, 2019 presents to the ER with complaints of persistent nausea vomiting for the last 4 days.  Denies any blood in the vomitus.  Denies any diarrhea.  Patient does have some suprapubic pain.  Patient states he did not take any of her medications because she did not have insurance and she just got Medicaid.  ED Course: In the ER patient had blood pressure with systolic more than 301 EKG showing normal sinus rhythm chest x-ray unremarkable Covid test pending labs show blood glucose of 386 anion gap 15 creatinine is 1.7 potassium 2.6 albumin 2.3 WBC was 21.8 hemoglobin 11.4.  Patient was started on IV fluids potassium replacement and admitted for hypertensive urgency with intractable nausea vomiting leukocytosis and uncontrolled diabetes.  Review of Systems: As per HPI, rest all negative.   Past Medical History:  Diagnosis Date  . Diabetes mellitus    uncontrolled, last A1c was 14  . E. coli sepsis ([place])   . Ectopic pregnancy   . Essential hypertension, benign 11/01/2008  . Fatty liver   . Gastroparesis   . GERD (gastroesophageal reflux disease)   . Helicobacter pylori gastritis 09/07/2011  . Migraine   . Morbid obesity with body mass index (BMI) of 40.0 to 49.9 ([place])   . Pulmonary edema   . Pyelonephritis   . Ureteral obstruction     Past Surgical History:  Procedure Laterality Date  . AMPUTATION Right 02/04/2019   Procedure: RIGHT BELOW KNEE AMPUTATION;  Surgeon: Newt Minion, MD;  Location: [facility];  Service: Orthopedics;  Laterality: Right;  . ESOPHAGOGASTRODUODENOSCOPY  2010  . WOUND  DEBRIDEMENT Right 01/03/2019   Procedure: DEBRIDEMENT WOUND RIGHT LATERAL FOOT;  Surgeon: Angelia Mould, MD;  Location: [facility];  Service: Vascular;  Laterality: Right;     reports that she has never smoked. She has never used smokeless tobacco. She reports previous alcohol use. She reports current drug use. Drug: Cocaine.  Allergies  Allergen Reactions  . Bee Venom Anaphylaxis, Hives, Shortness Of Breath and Swelling  . Lovenox [Enoxaparin Sodium] Other (See Comments)    Hyperkalemia?  . Penicillins Itching    Has patient had a PCN reaction causing immediate rash, facial/tongue/throat swelling, SOB or lightheadedness with hypotension: yes Has patient had a PCN reaction causing severe rash involving mucus membranes or skin necrosis: unknown Has patient had a PCN reaction that required hospitalization : yes Has patient had a PCN reaction occurring within the last 10 years: no If all of the above answers are "NO", then may proceed with Cephalosporin use.     Family History  Problem Relation Age of Onset  . Arthritis Mother   . Asthma Mother   . COPD Mother   . Depression Mother   . Diabetes Mother   . Hypertension Mother   . Mental illness Mother   . Kidney disease Mother   . Crohn's disease Mother   . Aneurysm Father 84       brain  . Mental illness Sister   . Arthritis Maternal Aunt   . Asthma Maternal Aunt   . COPD Maternal Aunt   .  Depression Maternal Aunt   . Diabetes Maternal Aunt   . Hypertension Maternal Aunt   . Mental illness Maternal Aunt   . Stroke Maternal Aunt   . Heart failure Maternal Aunt   . Heart failure Maternal Grandfather   . Cancer Maternal Grandfather   . Diabetes Maternal Grandfather   . Heart disease Maternal Grandfather   . Hypertension Maternal Grandfather   . Hyperlipidemia Maternal Grandfather   . Mental illness Brother   . Diabetes Maternal Grandmother   . Heart disease Maternal Grandmother   . Hypertension Maternal Grandmother     . Hyperlipidemia Maternal Grandmother   . Diabetes Paternal Grandmother   . Heart disease Paternal Grandmother   . Diabetes Paternal Grandfather   . Cancer Paternal Grandfather     Prior to Admission medications   Not on File    Physical Exam: Constitutional: Moderately built and nourished. Vitals:   10/09/19 2048 10/09/19 2145 10/10/19 0036 10/10/19 0100  BP: (!) 169/121 (!) 178/112 (!) 186/118 (!) 170/98  Pulse: 95 92 (!) 101 (!) 103  Resp: 18 16 15 18   Temp:      TempSrc:      SpO2: 97% 98% 100% 100%   Eyes: Anicteric no pallor. ENMT: No discharge from the ears eyes nose or mouth. Neck: No mass felt.  No neck rigidity. Respiratory: No rhonchi or crepitations. Cardiovascular: S1-S2 heard. Abdomen: Soft nontender bowel sounds present. Musculoskeletal: No edema.  Right BKA. Skin: No rash. Neurologic: Alert awake oriented to time place and person.  Moves all extremities. Psychiatric: Appears normal but normal affect.   Labs on Admission: I have personally reviewed following labs and imaging studies  CBC: Recent Labs  Lab 10/09/19 2022  WBC 21.8*  HGB 11.4*  HCT 32.4*  MCV 82.7  PLT 889*   Basic Metabolic Panel: Recent Labs  Lab 10/09/19 2022  NA 131*  K 2.6*  CL 84*  CO2 32  GLUCOSE 386*  BUN 18  CREATININE 1.71*  CALCIUM 7.7*   GFR: CrCl cannot be calculated (Unknown ideal weight.). Liver Function Tests: Recent Labs  Lab 10/09/19 2022  AST 18  ALT 13  ALKPHOS 141*  BILITOT 1.0  PROT 6.4*  ALBUMIN 2.3*   Recent Labs  Lab 10/09/19 2022  LIPASE 31   No results for input(s): AMMONIA in the last 168 hours. Coagulation Profile: No results for input(s): INR, PROTIME in the last 168 hours. Cardiac Enzymes: No results for input(s): CKTOTAL, CKMB, CKMBINDEX, TROPONINI in the last 168 hours. BNP (last 3 results) No results for input(s): PROBNP in the last 8760 hours. HbA1C: No results for input(s): HGBA1C in the last 72 hours. CBG: No  results for input(s): GLUCAP in the last 168 hours. Lipid Profile: No results for input(s): CHOL, HDL, LDLCALC, TRIG, CHOLHDL, LDLDIRECT in the last 72 hours. Thyroid Function Tests: No results for input(s): TSH, T4TOTAL, FREET4, T3FREE, THYROIDAB in the last 72 hours. Anemia Panel: No results for input(s): VITAMINB12, FOLATE, FERRITIN, TIBC, IRON, RETICCTPCT in the last 72 hours. Urine analysis:    Component Value Date/Time   COLORURINE YELLOW 08/23/2019 2045   APPEARANCEUR CLEAR 08/23/2019 2045   LABSPEC 1.015 08/23/2019 2045   PHURINE 7.0 08/23/2019 2045   GLUCOSEU >=500 (A) 08/23/2019 2045   HGBUR LARGE (A) 08/23/2019 2045   BILIRUBINUR NEGATIVE 08/23/2019 2045   BILIRUBINUR negative 08/13/2017 1622   KETONESUR 15 (A) 08/23/2019 2045   PROTEINUR >300 (A) 08/23/2019 2045   UROBILINOGEN 0.2 08/13/2017 1622  UROBILINOGEN 0.2 11/25/2014 0553   NITRITE NEGATIVE 08/23/2019 2045   LEUKOCYTESUR NEGATIVE 08/23/2019 2045   Sepsis Labs: @LABRCNTIP (procalcitonin:4,lacticidven:4) )No results found for this or any previous visit (from the past 240 hour(s)).   Radiological Exams on Admission: DG Chest Portable 1 View  Result Date: 10/09/2019 CLINICAL DATA:  Nausea and vomiting x3 days. EXAM: PORTABLE CHEST 1 VIEW COMPARISON:  August 23, 2019 FINDINGS: The heart size and mediastinal contours are within normal limits. Both lungs are clear. The visualized skeletal structures are unremarkable. IMPRESSION: No active disease. Electronically Signed   By: Virgina Norfolk M.D.   On: 10/09/2019 23:00    EKG: Independently reviewed.  Sinus tachycardia.  Assessment/Plan Principal Problem:   Nausea & vomiting Active Problems:   Hypertensive urgency   Uncontrolled type 2 diabetes mellitus with hyperglycemia, without long-term current use of insulin (HCC)   Intractable nausea and vomiting   Hypokalemia    1. Intractable nausea vomiting cause not clear could be gastroparesis.  UA still pending.   Patient does have some suprapubic pain.  If vomiting persists since patient has significant leukocytosis we will get a CT scan of the abdomen. 2. Hypertensive urgency due to noncompliance with medication for now I have kept patient on as needed IV hydralazine.  Closely monitor blood pressure trends.  Once patient can take p.o. we will have to start different antihypertensives. 3. Uncontrolled diabetes mellitus type 2 last hemoglobin A1c 2 months ago was 9.8.  For now I have not started on long-acting insulin still due to low potassium.  I have started potassium replacement.  We will keep patient on sliding scale coverage after starting potassium replacement and once potassium is corrected we will start patient on long-acting insulin close to follow metabolic panel to make sure patient did not get into DKA. 4. Hypokalemia likely from vomiting.  Replace recheck.  Check magnesium with next blood draw. 5. Leukocytosis could be reactionary.  Patient is afebrile.  Chest x-ray unremarkable.  Patient is yet to give a UA.  However since patient has had sepsis during last admission blood cultures have been ordered.  If patient's vomiting persist will get a CT of the abdomen pelvis.  Patient's right BKA stump as per the patient does not have any changes seen. 6. Severe protein calorie malnutrition will need nutrition input. 7. History of right BKA.  Patient's Covid test UA urine drug screen all are pending.  Since patient has acute renal failure with hypertensive urgency intractable nausea vomiting uncontrolled diabetes will need close monitoring for any further deterioration in inpatient status.   DVT prophylaxis: Lovenox. Code Status: Full code. Family Communication: Discussed with patient. Disposition Plan: Home. Consults called: None. Admission status: Inpatient.   Rise Patience MD [facility] Pager [phone].  If 7PM-7AM, please contact night-coverage [email or web address] Password  [email or web address]  10/10/2019, 1:29 AM

## 2019-10-10 NOTE — Progress Notes (Signed)
PROGRESS NOTE    Sheila Austin  SPQ:[id number] DOB: Jul 15, 1985 DOA: 10/09/2019 PCP: Maximiano Coss, NP   Brief Narrative:  Sheila Austin is a 34 y.o. female with history of diabetes mellitus type 2, hypertension noncompliant with her medications due to insurance issues has not been taking any of her medications since discharge on August 25, 2019 presents to the ER with complaints of persistent nausea vomiting for the last 4 days.  Patient states he did not take any of her medications because she did not have insurance and she just got Medicaid.  ED Course: In the ER patient had blood pressure with systolic more than 333 EKG showing normal sinus rhythm chest x-ray unremarkable Covid test pending labs show blood glucose of 386 anion gap 15 creatinine is 1.7 potassium 2.6 albumin 2.3 WBC was 21.8 hemoglobin 11.4.  Patient was started on IV fluids potassium replacement and admitted for hypertensive urgency with intractable nausea vomiting leukocytosis and uncontrolled diabetes.  Assessment & Plan:   Principal Problem:   Nausea & vomiting Active Problems:   Hypertensive urgency   Uncontrolled type 2 diabetes mellitus with hyperglycemia, without long-term current use of insulin (HCC)   Intractable nausea and vomiting   Hypokalemia  # Intractable N/V - Likely gastroparesis given long standing uncontrolled diabetes mellitus and reported history of recurrent issues with nausea and vomiting - Patient reports some improvement since presentation to the ED where she did receive reglan - Will start reglan 5 mg TID before meals for now and monitor response - Also needs better glucose control and close follow up with PCP to assist with management given her previous lack of insurance  # Hypertensive urgency - Secondary to noncompliance due to lack of insurance - Will start lisinopril 10 mg and amlodipine 10 mg and monitor response - Will eventually need a diuretic as well however given her  persistent hyperglycemia will start with the two medications above. Diuretic likely to be added as an outpatient - IV hydralazine available as needed  # Uncontrolled diabetes mellitus - Long standing type 2 diabetes since she was 15. Has not been on any medication for some time now - Will start lantus 20 U with 3 U of novolog with meals and SSI with ACHS accuchecks. Will likely need to adjust meal time regimen however started low due to nausea and vomiting on arrival to avoid hypoglycemia  # Severe hypokalemia  - Secondary to vomiting and poor oral intake - Received IV repletion in ED and giving 40 meq TID today with repeat K this afternoon and in AM - Checking magnesium as well  # Leukocytosis  - Likely reactive as there does not appear to be clear infectious process - If N/V does not improve will plan on CT ab/pelvis  # CKD - Likely secondary to diabetic nephropathy however most recent UA on 08/23/19 with blood and albuminuria noted. Repeating UA and obtaining UPC as well as she may require further work up regarding her renal disease if UA appears active  - Will need nephrology follow up upon discharge - Starting lisinopril for HTN as well as anti-proteinuric effect - May benefit from [place] as well once more stable as an outpatient  # Hyponatremia - Secondary to hyperglycemia  #Hypoalbuminemia - Concern for nephrotic syndrome possible related to diabetes - Checking UPC and will check lipid panel in AM as well  DVT prophylaxis:Heparin Code Status: Full Family Communication:None Disposition Plan:Anticipate discharge home to self care once able to tolerate diet  along with improved BP and glucose control  Consultants:   none   Antimicrobials:   None   Subjective: Patient seen at bedside this morning. She reports some improvement in her nausea and would like to attempt to eat today. She does admit to abdominal discomfort and ask for something to "relax her stomach." She  denies any CP, SOB, fever, chills.   Objective: Vitals:   10/10/19 1000 10/10/19 1100 10/10/19 1237 10/10/19 1406  BP: (!) 169/109 (!) 157/103 130/77 (!) 158/94  Pulse: (!) 110 (!) 110 (!) 101 (!) 109  Resp: (!) 0 (!) 0 (!) 21 (!) 21  Temp:      TempSrc:      SpO2: 97% 96% 100% 94%    Intake/Output Summary (Last 24 hours) at 10/10/2019 1522 Last data filed at 10/10/2019 0159 Gross per 24 hour  Intake 2100 ml  Output --  Net 2100 ml   There were no vitals filed for this visit.  Examination:  General exam: Appears calm and comfortable  Respiratory system: Clear to auscultation. Respiratory effort normal. Cardiovascular system: S1 & S2 heard, RRR. No JVD, murmurs, rubs, gallops or clicks.  Gastrointestinal system: Abdomen is nondistended, soft and nontender. No organomegaly or masses felt. Normal bowel sounds heard. Central nervous system: Alert and oriented. No focal neurological deficits. Extremities: R AKA Skin: No rashes, lesions or ulcers Psychiatry: Judgement and insight appear normal. Mood & affect appropriate.     Data Reviewed: I have personally reviewed following labs and imaging studies  CBC: Recent Labs  Lab 10/09/19 2022 10/10/19 0535  WBC 21.8* 14.8*  NEUTROABS  --  9.5*  HGB 11.4* 10.9*  HCT 32.4* 31.3*  MCV 82.7 83.0  PLT 487* 626   Basic Metabolic Panel: Recent Labs  Lab 10/09/19 2022 10/09/19 2216 10/10/19 0535 10/10/19 1230  NA 131*  --  132* 133*  K 2.6*  --  2.7* 3.1*  CL 84*  --  91* 94*  CO2 32  --  32 27  GLUCOSE 386*  --  182* 219*  BUN 18  --  18 18  CREATININE 1.71*  --  1.61* 1.86*  CALCIUM 7.7*  --  7.2* 6.7*  MG  --  1.5*  --   --    GFR: CrCl cannot be calculated (Unknown ideal weight.). Liver Function Tests: Recent Labs  Lab 10/09/19 2022 10/10/19 0535  AST 18 15  ALT 13 10  ALKPHOS 141* 105  BILITOT 1.0 0.5  PROT 6.4* 5.2*  ALBUMIN 2.3* 2.0*   Recent Labs  Lab 10/09/19 2022  LIPASE 31   No results for  input(s): AMMONIA in the last 168 hours. Coagulation Profile: No results for input(s): INR, PROTIME in the last 168 hours. Cardiac Enzymes: No results for input(s): CKTOTAL, CKMB, CKMBINDEX, TROPONINI in the last 168 hours. BNP (last 3 results) No results for input(s): PROBNP in the last 8760 hours. HbA1C: Recent Labs    10/10/19 0535  HGBA1C 9.7*   CBG: Recent Labs  Lab 10/10/19 0213 10/10/19 0737 10/10/19 1144  GLUCAP 279* 138* 202*   Lipid Profile: No results for input(s): CHOL, HDL, LDLCALC, TRIG, CHOLHDL, LDLDIRECT in the last 72 hours. Thyroid Function Tests: No results for input(s): TSH, T4TOTAL, FREET4, T3FREE, THYROIDAB in the last 72 hours. Anemia Panel: No results for input(s): VITAMINB12, FOLATE, FERRITIN, TIBC, IRON, RETICCTPCT in the last 72 hours. Urine analysis:    Component Value Date/Time   COLORURINE YELLOW 08/23/2019 2045   APPEARANCEUR  CLEAR 08/23/2019 2045   LABSPEC 1.015 08/23/2019 2045   PHURINE 7.0 08/23/2019 2045   GLUCOSEU >=500 (A) 08/23/2019 2045   HGBUR LARGE (A) 08/23/2019 2045   BILIRUBINUR NEGATIVE 08/23/2019 2045   BILIRUBINUR negative 08/13/2017 1622   KETONESUR 15 (A) 08/23/2019 2045   PROTEINUR >300 (A) 08/23/2019 2045   UROBILINOGEN 0.2 08/13/2017 1622   UROBILINOGEN 0.2 11/25/2014 0553   NITRITE NEGATIVE 08/23/2019 2045   LEUKOCYTESUR NEGATIVE 08/23/2019 2045   Sepsis Labs: @LABRCNTIP (procalcitonin:4,lacticidven:4)  ) Recent Results (from the past 240 hour(s))  SARS Coronavirus 2 by RT PCR (hospital order, performed in [place] hospital lab) Nasopharyngeal Nasopharyngeal Swab     Status: None   Collection Time: 10/09/19 10:22 PM   Specimen: Nasopharyngeal Swab  Result Value Ref Range Status   SARS Coronavirus 2 NEGATIVE NEGATIVE Final    Comment: (NOTE) SARS-CoV-2 target nucleic acids are NOT DETECTED. The SARS-CoV-2 RNA is generally detectable in upper and lower respiratory specimens during the acute phase of  infection. The lowest concentration of SARS-CoV-2 viral copies this assay can detect is 250 copies / mL. A negative result does not preclude SARS-CoV-2 infection and should not be used as the sole basis for treatment or other patient management decisions.  A negative result may occur with improper specimen collection / handling, submission of specimen other than nasopharyngeal swab, presence of viral mutation(s) within the areas targeted by this assay, and inadequate number of viral copies (<250 copies / mL). A negative result must be combined with clinical observations, patient history, and epidemiological information. Fact Sheet for Patients:   StrictlyIdeas.no Fact Sheet for Healthcare Providers: BankingDealers.co.za This test is not yet approved or cleared  by the [place] FDA and has been authorized for detection and/or diagnosis of SARS-CoV-2 by FDA under an Emergency Use Authorization (EUA).  This EUA will remain in effect (meaning this test can be used) for the duration of the COVID-19 declaration under Section 564(b)(1) of the Act, 21 U.S.C. section 360bbb-3(b)(1), unless the authorization is terminated or revoked sooner. Performed at [facility], [place] [address] [place], [place] [address]          Radiology Studies: DG Chest Portable 1 View  Result Date: 10/09/2019 CLINICAL DATA:  Nausea and vomiting x3 days. EXAM: PORTABLE CHEST 1 VIEW COMPARISON:  August 23, 2019 FINDINGS: The heart size and mediastinal contours are within normal limits. Both lungs are clear. The visualized skeletal structures are unremarkable. IMPRESSION: No active disease. Electronically Signed   By: Virgina Norfolk M.D.   On: 10/09/2019 23:00        Scheduled Meds:  amLODipine  10 mg Oral Daily   insulin aspart  0-5 Units Subcutaneous QHS   insulin aspart  0-9 Units Subcutaneous TID WC   insulin aspart  3 Units  Subcutaneous TID WC   insulin glargine  20 Units Subcutaneous QHS   lisinopril  10 mg Oral Daily   potassium chloride  40 mEq Oral TID   Continuous Infusions:  lactated ringers       LOS: 0 days    Arlan Organ, DO [facility]  If 7PM-7AM, please contact night-coverage [email or web address]  10/10/2019, 3:22 PM

## 2019-10-10 NOTE — ED Notes (Signed)
Patient provided with lunch tray

## 2019-10-10 NOTE — Progress Notes (Signed)
Have alerted MD via secure text that pt has stated she is leaving AMA as soon as her ride gets here.

## 2019-10-10 NOTE — Progress Notes (Signed)
Have spoken with Dr. Gloriann Loan. Pt is leaving AMA with significant other at this time. IV and telemetry removed.  AC aware.

## 2019-10-10 NOTE — ED Notes (Signed)
Patient is aware that urine sample is needed. 

## 2019-10-11 ENCOUNTER — Encounter: Payer: Self-pay | Admitting: Registered Nurse

## 2019-10-11 NOTE — Discharge Summary (Addendum)
Physician Discharge [place] PXT:[id number] DOB: August 25, 1985 DOA: 10/09/2019  PCP: Maximiano Coss, NP  Admit date: 10/09/2019 Discharge date: 10/10/2019 Admitted From: Home Disposition:  Home  Recommendations for Outpatient Follow-up:  1. Follow up with PCP in 1-2 weeks 2. Please obtain BMP/CBC in one week 3. Please follow up on the following pending results: Blood cultures  Home Health:No Equipment/Devices:None Discharge Condition:Guarded. Left AMA CODE STATUS:Full Diet recommendation: Carb modified Brief/Interim Summary: Sheila Austin is a 34 y.o. female with history of diabetes mellitus type 2, hypertension noncompliant with her medications due to insurance issues has not been taking any of her medications since discharge on August 25, 2019 presents to the ER with complaints of persistent nausea vomiting for the last 4 days.  Denies any blood in the vomitus.  Denies any diarrhea.  Patient does have some suprapubic pain.  Patient states he did not take any of her medications because she did not have insurance and she just got Medicaid.  In the ER patient had blood pressure with systolic more than 627 EKG showing normal sinus rhythm chest x-ray unremarkable Covid test pending labs show blood glucose of 386 anion gap 15 creatinine is 1.7 potassium 2.6 albumin 2.3 WBC was 21.8 hemoglobin 11.4.  Patient was started on IV fluids potassium replacement and admitted for hypertensive urgency with intractable nausea vomiting leukocytosis and uncontrolled diabetes.  She was admitted to the medical floor and started on reglan for presumed gastroparesis with some improvement in her nausea and vomiting. BP also improved after IV hydralazine in the ED and was initially started on lisinopril and amlodipine, however shortly after getting to the medical floor patient decided to leave AMA. Despite telling patient of multiple risks of leaving AMA, including worsening of her hyperglycemia,  hypertension, CKD, and death she decided to leave the hospital anyway. She did not receive any of her needed medication due to leaving AMA. She will hopefully follow up with her PCP ASAP to obtain these medications as well as to follow up on pending blood culture.   Discharge Diagnoses:  Principal Problem:   Nausea & vomiting Active Problems:   Gastroparesis   Obesity   Hyponatremia   Hypertensive urgency   Uncontrolled type 2 diabetes mellitus with hyperglycemia, without long-term current use of insulin (HCC)   Intractable nausea and vomiting   Hypokalemia   CKD (chronic kidney disease), stage III   CKD (chronic kidney disease), stage III    Discharge Instructions   Allergies as of 10/10/2019      Reactions   Bee Venom Anaphylaxis, Hives, Shortness Of Breath, Swelling   Lovenox [enoxaparin Sodium] Other (See Comments)   Hyperkalemia?   Penicillins Itching   Has patient had a PCN reaction causing immediate rash, facial/tongue/throat swelling, SOB or lightheadedness with hypotension: yes Has patient had a PCN reaction causing severe rash involving mucus membranes or skin necrosis: unknown Has patient had a PCN reaction that required hospitalization : yes Has patient had a PCN reaction occurring within the last 10 years: no If all of the above answers are "NO", then may proceed with Cephalosporin use.      Medication List    You have not been prescribed any medications.     Allergies  Allergen Reactions   Bee Venom Anaphylaxis, Hives, Shortness Of Breath and Swelling   Lovenox [Enoxaparin Sodium] Other (See Comments)    Hyperkalemia?   Penicillins Itching    Has patient had a PCN reaction causing immediate rash,  facial/tongue/throat swelling, SOB or lightheadedness with hypotension: yes Has patient had a PCN reaction causing severe rash involving mucus membranes or skin necrosis: unknown Has patient had a PCN reaction that required hospitalization : yes Has patient had  a PCN reaction occurring within the last 10 years: no If all of the above answers are "NO", then may proceed with Cephalosporin use.     Consultations:  None   Procedures/Studies: DG Chest Portable 1 View  Result Date: 10/09/2019 CLINICAL DATA:  Nausea and vomiting x3 days. EXAM: PORTABLE CHEST 1 VIEW COMPARISON:  August 23, 2019 FINDINGS: The heart size and mediastinal contours are within normal limits. Both lungs are clear. The visualized skeletal structures are unremarkable. IMPRESSION: No active disease. Electronically Signed   By: Virgina Norfolk M.D.   On: 10/09/2019 23:00     Subjective: Patient left AMA on 6/5. She said she had to leave. She understands risks associated with leaving AMA.   Discharge Exam: Vitals:   10/10/19 1406 10/10/19 1527  BP: (!) 158/94 125/85  Pulse: (!) 109 (!) 104  Resp: (!) 21 18  Temp:  98.9 F (37.2 C)  SpO2: 94% 99%   Vitals:   10/10/19 1100 10/10/19 1237 10/10/19 1406 10/10/19 1527  BP: (!) 157/103 130/77 (!) 158/94 125/85  Pulse: (!) 110 (!) 101 (!) 109 (!) 104  Resp: (!) 0 (!) 21 (!) 21 18  Temp:    98.9 F (37.2 C)  TempSrc:    Oral  SpO2: 96% 100% 94% 99%    General: Pt is alert, awake, not in acute distress Cardiovascular: RRR, S1/S2 +, no rubs, no gallops Respiratory: CTA bilaterally, no wheezing, no rhonchi Abdominal: Soft, NT, ND, bowel sounds + Extremities: 1+ edema, R AKA    The results of significant diagnostics from this hospitalization (including imaging, microbiology, ancillary and laboratory) are listed below for reference.     Microbiology: Recent Results (from the past 240 hour(s))  SARS Coronavirus 2 by RT PCR (hospital order, performed in [facility] hospital lab) Nasopharyngeal Nasopharyngeal Swab     Status: None   Collection Time: 10/09/19 10:22 PM   Specimen: Nasopharyngeal Swab  Result Value Ref Range Status   SARS Coronavirus 2 NEGATIVE NEGATIVE Final    Comment: (NOTE) SARS-CoV-2 target nucleic  acids are NOT DETECTED. The SARS-CoV-2 RNA is generally detectable in upper and lower respiratory specimens during the acute phase of infection. The lowest concentration of SARS-CoV-2 viral copies this assay can detect is 250 copies / mL. A negative result does not preclude SARS-CoV-2 infection and should not be used as the sole basis for treatment or other patient management decisions.  A negative result may occur with improper specimen collection / handling, submission of specimen other than nasopharyngeal swab, presence of viral mutation(s) within the areas targeted by this assay, and inadequate number of viral copies (<250 copies / mL). A negative result must be combined with clinical observations, patient history, and epidemiological information. Fact Sheet for Patients:   StrictlyIdeas.no Fact Sheet for Healthcare Providers: BankingDealers.co.za This test is not yet approved or cleared  by the [place] FDA and has been authorized for detection and/or diagnosis of SARS-CoV-2 by FDA under an Emergency Use Authorization (EUA).  This EUA will remain in effect (meaning this test can be used) for the duration of the COVID-19 declaration under Section 564(b)(1) of the Act, 21 U.S.C. section 360bbb-3(b)(1), unless the authorization is terminated or revoked sooner. Performed at [facility], [place]  [address], [place], [place] [address]   Culture, blood (routine x 2)     Status: None (Preliminary result)   Collection Time: 10/10/19  5:34 AM   Specimen: BLOOD  Result Value Ref Range Status   Specimen Description   Final    BLOOD RIGHT ANTECUBITAL Performed at [facility], [address] [place], [place] [address]    Special Requests   Final    BOTTLES DRAWN AEROBIC AND ANAEROBIC Blood Culture adequate volume Performed at [facility], [address] [place], [place] [address]    Culture    Final    NO GROWTH 1 DAY Performed at [facility], [place] [address] [place], [place] [address]    Report Status PENDING  Incomplete  Culture, blood (routine x 2)     Status: None (Preliminary result)   Collection Time: 10/10/19  4:22 PM   Specimen: BLOOD RIGHT HAND  Result Value Ref Range Status   Specimen Description   Final    BLOOD RIGHT HAND Performed at [facility] [address], [place], [place] [address]    Special Requests   Final    BOTTLES DRAWN AEROBIC ONLY Blood Culture adequate volume Performed at [facility] [address] [place],  [address]    Culture   Final    NO GROWTH < 24 HOURS Performed at [employer] [address], [place],  [address]    Report Status PENDING  Incomplete     Labs: BNP (last 3 results) No results for input(s): BNP in the last 8760 hours. Basic Metabolic Panel: Recent Labs  Lab 10/09/19 2022 10/09/19 2216 10/10/19 0535 10/10/19 1230  NA 131*  --  132* 133*  K 2.6*  --  2.7* 3.1*  CL 84*  --  91* 94*  CO2 32  --  32 27  GLUCOSE 386*  --  182* 219*  BUN 18  --  18 18  CREATININE 1.71*  --  1.61* 1.86*  CALCIUM 7.7*  --  7.2* 6.7*  MG  --  1.5*  --   --    Liver Function Tests: Recent Labs  Lab 10/09/19 2022 10/10/19 0535  AST 18 15  ALT 13 10  ALKPHOS 141* 105  BILITOT 1.0 0.5  PROT 6.4* 5.2*  ALBUMIN 2.3* 2.0*   Recent Labs  Lab 10/09/19 2022  LIPASE 31   No results for input(s): AMMONIA in the last 168 hours. CBC: Recent Labs  Lab 10/09/19 2022 10/10/19 0535  WBC 21.8* 14.8*  NEUTROABS  --  9.5*  HGB 11.4* 10.9*  HCT 32.4* 31.3*  MCV 82.7 83.0  PLT 487* 346   Cardiac Enzymes: No results for input(s): CKTOTAL, CKMB, CKMBINDEX, TROPONINI in the last 168 hours. BNP: Invalid input(s): POCBNP CBG: Recent Labs  Lab 10/10/19 0213 10/10/19 0737 10/10/19 1144 10/10/19 1631  GLUCAP 279* 138* 202* 138*   D-Dimer No results for  input(s): DDIMER in the last 72 hours. Hgb A1c Recent Labs    10/10/19 0535  HGBA1C 9.7*   Lipid Profile No results for input(s): CHOL, HDL, LDLCALC, TRIG, CHOLHDL, LDLDIRECT in the last 72 hours. Thyroid function studies No results for input(s): TSH, T4TOTAL, T3FREE, THYROIDAB in the last 72 hours.  Invalid input(s): FREET3 Anemia work up No results for input(s): VITAMINB12, FOLATE, FERRITIN, TIBC, IRON, RETICCTPCT in the last 72 hours. Urinalysis    Component Value Date/Time  COLORURINE YELLOW 08/23/2019 2045   APPEARANCEUR CLEAR 08/23/2019 2045   LABSPEC 1.015 08/23/2019 2045   PHURINE 7.0 08/23/2019 2045   GLUCOSEU >=500 (A) 08/23/2019 2045   HGBUR LARGE (A) 08/23/2019 2045   BILIRUBINUR NEGATIVE 08/23/2019 2045   BILIRUBINUR negative 08/13/2017 1622   KETONESUR 15 (A) 08/23/2019 2045   PROTEINUR >300 (A) 08/23/2019 2045   UROBILINOGEN 0.2 08/13/2017 1622   UROBILINOGEN 0.2 11/25/2014 0553   NITRITE NEGATIVE 08/23/2019 2045   LEUKOCYTESUR NEGATIVE 08/23/2019 2045   Sepsis Labs Invalid input(s): PROCALCITONIN,  WBC,  LACTICIDVEN Microbiology Recent Results (from the past 240 hour(s))  SARS Coronavirus 2 by RT PCR (hospital order, performed in [facility] hospital lab) Nasopharyngeal Nasopharyngeal Swab     Status: None   Collection Time: 10/09/19 10:22 PM   Specimen: Nasopharyngeal Swab  Result Value Ref Range Status   SARS Coronavirus 2 NEGATIVE NEGATIVE Final    Comment: (NOTE) SARS-CoV-2 target nucleic acids are NOT DETECTED. The SARS-CoV-2 RNA is generally detectable in upper and lower respiratory specimens during the acute phase of infection. The lowest concentration of SARS-CoV-2 viral copies this assay can detect is 250 copies / mL. A negative result does not preclude SARS-CoV-2 infection and should not be used as the sole basis for treatment or other patient management decisions.  A negative result may occur with improper specimen collection / handling,  submission of specimen other than nasopharyngeal swab, presence of viral mutation(s) within the areas targeted by this assay, and inadequate number of viral copies (<250 copies / mL). A negative result must be combined with clinical observations, patient history, and epidemiological information. Fact Sheet for Patients:   StrictlyIdeas.no Fact Sheet for Healthcare Providers: BankingDealers.co.za This test is not yet approved or cleared  by the [place] FDA and has been authorized for detection and/or diagnosis of SARS-CoV-2 by FDA under an Emergency Use Authorization (EUA).  This EUA will remain in effect (meaning this test can be used) for the duration of the COVID-19 declaration under Section 564(b)(1) of the Act, 21 U.S.C. section 360bbb-3(b)(1), unless the authorization is terminated or revoked sooner. Performed at [facility], [place] [address], [place], [place] [address]   Culture, blood (routine x 2)     Status: None (Preliminary result)   Collection Time: 10/10/19  5:34 AM   Specimen: BLOOD  Result Value Ref Range Status   Specimen Description   Final    BLOOD RIGHT ANTECUBITAL Performed at [facility], [address] [place], [place] [address]    Special Requests   Final    BOTTLES DRAWN AEROBIC AND ANAEROBIC Blood Culture adequate volume Performed at [facility], [address] [place], [place] [address]    Culture   Final    NO GROWTH 1 DAY Performed at [facility], [place] [address] [place], [place] [address]    Report Status PENDING  Incomplete  Culture, blood (routine x 2)     Status: None (Preliminary result)   Collection Time: 10/10/19  4:22 PM   Specimen: BLOOD RIGHT HAND  Result Value Ref Range Status   Specimen Description   Final    BLOOD RIGHT HAND Performed at [facility] [address] [place], [place] [address]    Special Requests    Final    BOTTLES DRAWN AEROBIC ONLY Blood Culture adequate volume Performed at [facility] [address] [place], [place] [address]  Culture   Final    NO GROWTH < 24 HOURS Performed at [facility], [place] [address] [place], [name] [address]    Report Status PENDING  Incomplete     Time coordinating discharge: less than 30 minutes  SIGNED:   Arlan Organ, DO [facility] 10/11/2019, 1:45 PM   If 7PM-7AM, please contact night-coverage [email or web address]

## 2019-10-15 LAB — CULTURE, BLOOD (ROUTINE X 2)
Culture: NO GROWTH
Culture: NO GROWTH
Special Requests: ADEQUATE
Special Requests: ADEQUATE

## 2019-11-20 ENCOUNTER — Emergency Department (HOSPITAL_COMMUNITY)
Admission: EM | Admit: 2019-11-20 | Discharge: 2019-11-21 | Disposition: A | Discharge status: Home / Self Care | Payer: Medicaid Other | Attending: Emergency Medicine | Admitting: Emergency Medicine

## 2019-11-20 ENCOUNTER — Other Ambulatory Visit: Payer: Self-pay

## 2019-11-20 ENCOUNTER — Emergency Department (HOSPITAL_COMMUNITY): Payer: Medicaid Other

## 2019-11-20 ENCOUNTER — Encounter (HOSPITAL_COMMUNITY): Payer: Self-pay | Admitting: Emergency Medicine

## 2019-11-20 DIAGNOSIS — Z89511 Acquired absence of right leg below knee: Secondary | ICD-10-CM | POA: Insufficient documentation

## 2019-11-20 DIAGNOSIS — N189 Chronic kidney disease, unspecified: Secondary | ICD-10-CM

## 2019-11-20 DIAGNOSIS — I129 Hypertensive chronic kidney disease with stage 1 through stage 4 chronic kidney disease, or unspecified chronic kidney disease: Secondary | ICD-10-CM | POA: Diagnosis present

## 2019-11-20 DIAGNOSIS — N183 Chronic kidney disease, stage 3 unspecified: Secondary | ICD-10-CM | POA: Diagnosis not present

## 2019-11-20 DIAGNOSIS — R635 Abnormal weight gain: Secondary | ICD-10-CM | POA: Insufficient documentation

## 2019-11-20 DIAGNOSIS — E1165 Type 2 diabetes mellitus with hyperglycemia: Secondary | ICD-10-CM | POA: Insufficient documentation

## 2019-11-20 DIAGNOSIS — Z79899 Other long term (current) drug therapy: Secondary | ICD-10-CM | POA: Insufficient documentation

## 2019-11-20 DIAGNOSIS — E1122 Type 2 diabetes mellitus with diabetic chronic kidney disease: Secondary | ICD-10-CM | POA: Diagnosis not present

## 2019-11-20 DIAGNOSIS — Z794 Long term (current) use of insulin: Secondary | ICD-10-CM | POA: Diagnosis not present

## 2019-11-20 LAB — CBC
HCT: 26.2 % — ABNORMAL LOW (ref 36.0–46.0)
Hemoglobin: 8.1 g/dL — ABNORMAL LOW (ref 12.0–15.0)
MCH: 27.6 pg (ref 26.0–34.0)
MCHC: 30.9 g/dL (ref 30.0–36.0)
MCV: 89.1 fL (ref 80.0–100.0)
Platelets: 318 10*3/uL (ref 150–400)
RBC: 2.94 MIL/uL — ABNORMAL LOW (ref 3.87–5.11)
RDW: 14.6 % (ref 11.5–15.5)
WBC: 13.4 10*3/uL — ABNORMAL HIGH (ref 4.0–10.5)
nRBC: 0 % (ref 0.0–0.2)

## 2019-11-20 LAB — BASIC METABOLIC PANEL
Anion gap: 8 (ref 5–15)
BUN: 36 mg/dL — ABNORMAL HIGH (ref 6–20)
CO2: 23 mmol/L (ref 22–32)
Calcium: 8.3 mg/dL — ABNORMAL LOW (ref 8.9–10.3)
Chloride: 108 mmol/L (ref 98–111)
Creatinine, Ser: 2.24 mg/dL — ABNORMAL HIGH (ref 0.44–1.00)
GFR calc Af Amer: 33 mL/min — ABNORMAL LOW (ref >60)
GFR calc non Af Amer: 28 mL/min — ABNORMAL LOW (ref >60)
Glucose, Bld: 201 mg/dL — ABNORMAL HIGH (ref 70–99)
Potassium: 5 mmol/L (ref 3.5–5.1)
Sodium: 139 mmol/L (ref 135–145)

## 2019-11-20 MED ORDER — SODIUM CHLORIDE 0.9% FLUSH
3.0000 mL | Freq: Once | INTRAVENOUS | Status: AC
  Administered 2019-11-21: 3 mL via INTRAVENOUS

## 2019-11-20 NOTE — ED Triage Notes (Signed)
Patient arrives to ED with complaints of recent weight gain for the past two weeks. 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 Patient was started on lasix but states that it has not helped. Patient states she has gained >50 pounds over the past two weeks.

## 2019-11-21 LAB — URINALYSIS, ROUTINE W REFLEX MICROSCOPIC
Bilirubin Urine: NEGATIVE
Glucose, UA: 150 mg/dL — AB
Hgb urine dipstick: NEGATIVE
Ketones, ur: NEGATIVE mg/dL
Leukocytes,Ua: NEGATIVE
Nitrite: NEGATIVE
Protein, ur: >=300 mg/dL — AB
Specific Gravity, Urine: 1.012 (ref 1.005–1.030)
pH: 6 (ref 5.0–8.0)

## 2019-11-21 LAB — CBG MONITORING, ED: Glucose-Capillary: 118 mg/dL — ABNORMAL HIGH (ref 70–99)

## 2019-11-21 MED ORDER — FLUCONAZOLE 200 MG PO TABS
200.0000 mg | ORAL_TABLET | Freq: Once | ORAL | Status: AC
  Administered 2019-11-21: 200 mg via ORAL
  Filled 2019-11-21: qty 1

## 2019-11-21 MED ORDER — FUROSEMIDE 40 MG PO TABS: 60.0000 mg | ORAL_TABLET | Freq: Two times a day (BID) | ORAL | 0 refills | Status: DC

## 2019-11-21 MED ORDER — FUROSEMIDE 10 MG/ML IJ SOLN
80.0000 mg | Freq: Once | INTRAMUSCULAR | Status: AC
  Administered 2019-11-21: 80 mg via INTRAVENOUS
  Filled 2019-11-21: qty 8

## 2019-11-21 NOTE — ED Provider Notes (Signed)
[facility]MENT Provider Note   CSN: [id number] Arrival date & time: 11/20/19  1828     History Chief Complaint  Patient presents with  . Weight Gain  . Kidney Disease    [facility] is a 34 y.o. female.  Patient with extensive history of chronic kidney disease of which she did not know until recently.  Apparently patient was admitted to the hospital multiple times in the last couple months for an I&D's of wounds on her left foot she has a right BKA as well.  She states that she has gained approximately 80 pounds of fluid in the last 5 to 6 weeks since her most recent surgery.  I reviewed the records it does appear that she has gained that much.  She has been admitted a couple times since then with diuresis but apparently has not worked.  She is on 20 mg Lasix twice daily at home and she is been taken that she stated that today her home health nurse came out and said that she might need dialysis and to come to emergency room for further evaluation.  She urinates 2-3 times a day and urinates quite a bit when she does.  No other urinary symptoms.  No other new symptoms.        Past Medical History:  Diagnosis Date  . Diabetes mellitus    uncontrolled, last A1c was 14  . E. coli sepsis ([place])   . Ectopic pregnancy   . Essential hypertension, benign 11/01/2008  . Fatty liver   . Gastroparesis   . GERD (gastroesophageal reflux disease)   . Helicobacter pylori gastritis 09/07/2011  . Migraine   . Morbid obesity with body mass index (BMI) of 40.0 to 49.9 ([place])   . Pulmonary edema   . Pyelonephritis   . Ureteral obstruction     Patient Active Problem List   Diagnosis Date Noted  . Nausea & vomiting 10/10/2019  . Hypokalemia 10/10/2019  . CKD (chronic kidney disease), stage III 10/10/2019  . CKD (chronic kidney disease), stage III 10/10/2019  . Cellulitis of right lower extremity 08/23/2019  . Uncontrolled type 2 diabetes mellitus with  hyperglycemia, without long-term current use of insulin ([place]) 08/23/2019  . Intractable nausea and vomiting 08/23/2019  . GERD with esophagitis 08/23/2019  . Lactic acidosis 08/23/2019  . Hyperosmolar hyperglycemic state (HHS) ([place]) 05/16/2019  . AKI (acute kidney injury) ([place]) 05/16/2019  . S/P AKA (above knee amputation), right ([place]) 05/16/2019  . GERD (gastroesophageal reflux disease)   . Hypertensive urgency   . Benign essential HTN   . Unilateral complete BKA, right, sequela ([place])   . Unilateral complete BKA, right, initial encounter ([place]) 02/09/2019  . Acquired absence of right leg below knee ([place])   . Hyponatremia   . Acute blood loss anemia   . Sinus tachycardia   . Post-operative pain   . Bacteremia   . 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 Streptococcal bacteremia   . Klebsiella pneumoniae infection   . Abscess of leg, right   . Skin ulcer of right foot with necrosis of muscle ([place])   . Severe protein-calorie malnutrition ([place])   . DM (diabetes mellitus) ([place]) 02/01/2019  . Adjustment disorder 02/01/2019  . Sepsis due to cellulitis ([place]) 02/01/2019  . Cellulitis in diabetic foot ([place]) 12/31/2018  . Obesity 12/31/2018  . Facial cellulitis 07/02/2018  . Class 3 severe obesity with body mass index (BMI) of 40.0 to 44.9  in adult [facility]) 07/09/2017  . Abscess of right breast 02/24/2016  . BMI 45.0-49.9, adult ([place]) 09/29/2015  . Skin lesions - right lower extremity 12/07/2013  . Morbid obesity with BMI of 50.0-59.9, adult ([place]) 10/05/2011  . Obstructive sleep apnea 10/05/2011  . HLD (hyperlipidemia) 09/27/2011  . Migraine 09/07/2011  . Essential hypertension, benign 11/01/2008  . Gastroparesis 09/27/2008  . IRREGULAR MENSES 08/24/2008  . Diabetes mellitus out of control ([place]) 07/04/2006    Past Surgical History:  Procedure Laterality Date  . AMPUTATION Right 02/04/2019   Procedure: RIGHT BELOW KNEE AMPUTATION;  Surgeon: Newt Minion, MD;  Location: [facility];   Service: Orthopedics;  Laterality: Right;  . ESOPHAGOGASTRODUODENOSCOPY  2010  . WOUND DEBRIDEMENT Right 01/03/2019   Procedure: DEBRIDEMENT WOUND RIGHT LATERAL FOOT;  Surgeon: Angelia Mould, MD;  [facility];  Service: Vascular;  Laterality: Right;     OB History    Gravida  1   Para      Term      Preterm      AB  1   Living  0     SAB      TAB      Ectopic  1   Multiple      Live Births              Family History  Problem Relation Age of Onset  . Arthritis Mother   . Asthma Mother   . COPD Mother   . Depression Mother   . Diabetes Mother   . Hypertension Mother   . Mental illness Mother   . Kidney disease Mother   . Crohn's disease Mother   . Aneurysm Father 59       brain  . Mental illness Sister   . Arthritis Maternal Aunt   . Asthma Maternal Aunt   . COPD Maternal Aunt   . Depression Maternal Aunt   . Diabetes Maternal Aunt   . Hypertension Maternal Aunt   . Mental illness Maternal Aunt   . Stroke Maternal Aunt   . Heart failure Maternal Aunt   . Heart failure Maternal Grandfather   . Cancer Maternal Grandfather   . Diabetes Maternal Grandfather   . Heart disease Maternal Grandfather   . Hypertension Maternal Grandfather   . Hyperlipidemia Maternal Grandfather   . Mental illness Brother   . Diabetes Maternal Grandmother   . Heart disease Maternal Grandmother   . Hypertension Maternal Grandmother   . Hyperlipidemia Maternal Grandmother   . Diabetes Paternal Grandmother   . Heart disease Paternal Grandmother   . Diabetes Paternal Grandfather   . Cancer Paternal Grandfather     Social History   Tobacco Use  . Smoking status: Never Smoker  . Smokeless tobacco: Never Used  Vaping Use  . Vaping Use: Never used  Substance Use Topics  . Alcohol use: Not Currently  . Drug use: Yes    Types: Cocaine    Home Medications Prior to Admission medications   Medication Sig Start Date End Date Taking? Authorizing Provider    amLODipine (NORVASC) 10 MG tablet Take 10 mg by mouth daily. 10/29/19  Yes [provider]  CVS VITAMIN C 1000 MG tablet Take 1,000 mg by mouth 2 (two) times daily. 10/29/19  Yes [provider]  CVS ZINC GLUCONATE 50 MG tablet Take 50 mg by mouth daily. 10/29/19  Yes [provider]  doxycycline (VIBRA-TABS) 100 MG tablet Take 100 mg by mouth 2 (  two) times daily. 11/09/19  Yes [provider]  ferrous sulfate 325 (65 FE) MG tablet Take 325 mg by mouth daily. 10/29/19  Yes [provider]  furosemide (LASIX) 20 MG tablet Take 20 mg by mouth 2 (two) times daily. 11/16/19  Yes [provider]  gabapentin (NEURONTIN) 400 MG capsule Take 400 mg by mouth 3 (three) times daily. 11/16/19  Yes [provider]  hydrALAZINE (APRESOLINE) 25 MG tablet Take 25 mg by mouth every 8 (eight) hours. 11/16/19  Yes [provider]  ibuprofen (ADVIL) 200 MG tablet Take 400 mg by mouth daily as needed for moderate pain.   Yes [provider]  linezolid (ZYVOX) 600 MG tablet Take 600 mg by mouth 2 (two) times daily. 11/16/19  Yes [provider]  furosemide (LASIX) 40 MG tablet Take 1.5 tablets (60 mg total) by mouth in the morning and at bedtime for 4 days. Until you see your kidney doctor 11/21/19 11/25/19  Bridgett Hattabaugh, Corene Cornea, MD  HUMALOG 100 UNIT/ML injection Inject 2-5 Units into the skin 3 (three) times daily. 11/16/19   [provider]  LANTUS 100 UNIT/ML injection Inject 20 Units into the skin at bedtime. 11/16/19   [provider]    Allergies    Bee venom, Penicillins, and Lovenox [enoxaparin sodium]  Review of Systems   Review of Systems  All other systems reviewed and are negative.   Physical Exam Updated Vital Signs BP 113/86   Pulse 99   Temp 98.3 F (36.8 C) (Oral)   Resp 17   Ht 5\' 1"  (1.549 m)   Wt (!) 137 kg   SpO2 96%   BMI 57.06 kg/m   Physical Exam Vitals and nursing note reviewed.   Constitutional:      Appearance: She is well-developed.  HENT:     Head: Normocephalic and atraumatic.     Nose: No congestion or rhinorrhea.     Mouth/Throat:     Pharynx: No oropharyngeal exudate or posterior oropharyngeal erythema.  Eyes:     Pupils: Pupils are equal, round, and reactive to light.  Cardiovascular:     Rate and Rhythm: Normal rate and regular rhythm.  Pulmonary:     Effort: No respiratory distress.     Breath sounds: No stridor.  Abdominal:     General: There is no distension.  Musculoskeletal:        General: Deformity (Right BKA) present.     Cervical back: Normal range of motion.     Left lower leg: Edema present.  Skin:    General: Skin is warm and dry.     Coloration: Skin is not pale.     Findings: No erythema.  Neurological:     General: No focal deficit present.     Mental Status: She is alert.     ED Results / Procedures / Treatments   Labs (all labs ordered are listed, but only abnormal results are displayed) Labs Reviewed  BASIC METABOLIC PANEL - Abnormal; Notable for the following components:      Result Value   Glucose, Bld 201 (*)    BUN 36 (*)    Creatinine, Ser 2.24 (*)    Calcium 8.3 (*)    GFR calc non Af Amer 28 (*)    GFR calc Af Amer 33 (*)    All other components within normal limits  CBC - Abnormal; Notable for the following components:   WBC 13.4 (*)    RBC 2.94 (*)  Hemoglobin 8.1 (*)    HCT 26.2 (*)    All other components within normal limits  URINALYSIS, ROUTINE W REFLEX MICROSCOPIC - Abnormal; Notable for the following components:   APPearance HAZY (*)    Glucose, UA 150 (*)    Protein, ur >=300 (*)    Bacteria, UA RARE (*)    All other components within normal limits  CBG MONITORING, ED - Abnormal; Notable for the following components:   Glucose-Capillary 118 (*)    All other components within normal limits    EKG None  Radiology DG Chest 2 View  Result Date: 11/20/2019 CLINICAL DATA:  Dyspnea EXAM:  CHEST - 2 VIEW COMPARISON:  10/09/2019 FINDINGS: The heart size and mediastinal contours are within normal limits. Both lungs are clear. The visualized skeletal structures are unremarkable. IMPRESSION: No active cardiopulmonary disease. Electronically Signed   By: Fidela Salisbury MD   On: 11/20/2019 19:32    Procedures Procedures (including critical care time)  Medications Ordered in ED Medications  fluconazole (DIFLUCAN) tablet 200 mg (has no administration in time range)  sodium chloride flush (NS) 0.9 % injection 3 mL (3 mLs Intravenous Given 11/21/19 0442)  furosemide (LASIX) injection 80 mg (80 mg Intravenous Given 11/21/19 0442)    ED Course  I have reviewed the triage vital signs and the nursing notes.  Pertinent labs & imaging results that were available during my care of the patient were reviewed by me and considered in my medical decision making (see chart for details).    MDM Rules/Calculators/A&P                          Significant diuresis here with IV Lasix. Will increase dose at home. No need for dialysis at this time. Has nephrology follow up scheduled already in a few days.  Final Clinical Impression(s) / ED Diagnoses Final diagnoses:  Chronic kidney disease, unspecified CKD stage    Rx / DC Orders ED Discharge Orders         Ordered    furosemide (LASIX) 40 MG tablet  2 times daily     Discontinue  Reprint     11/21/19 0637           Delaina Fetsch, Corene Cornea, MD 11/21/19 [phone]

## 2020-03-05 ENCOUNTER — Other Ambulatory Visit: Payer: Self-pay

## 2020-03-05 ENCOUNTER — Encounter (HOSPITAL_COMMUNITY): Payer: Self-pay | Admitting: Emergency Medicine

## 2020-03-05 ENCOUNTER — Emergency Department (HOSPITAL_COMMUNITY)
Admission: EM | Admit: 2020-03-05 | Discharge: 2020-03-05 | Disposition: A | Discharge status: Home / Self Care | Payer: Medicaid Other | Attending: Emergency Medicine | Admitting: Emergency Medicine

## 2020-03-05 DIAGNOSIS — R1110 Vomiting, unspecified: Secondary | ICD-10-CM | POA: Diagnosis present

## 2020-03-05 DIAGNOSIS — Z79899 Other long term (current) drug therapy: Secondary | ICD-10-CM | POA: Insufficient documentation

## 2020-03-05 DIAGNOSIS — R000 Tachycardia, unspecified: Secondary | ICD-10-CM | POA: Insufficient documentation

## 2020-03-05 DIAGNOSIS — Z794 Long term (current) use of insulin: Secondary | ICD-10-CM | POA: Insufficient documentation

## 2020-03-05 DIAGNOSIS — E1165 Type 2 diabetes mellitus with hyperglycemia: Secondary | ICD-10-CM | POA: Diagnosis not present

## 2020-03-05 DIAGNOSIS — I129 Hypertensive chronic kidney disease with stage 1 through stage 4 chronic kidney disease, or unspecified chronic kidney disease: Secondary | ICD-10-CM | POA: Insufficient documentation

## 2020-03-05 DIAGNOSIS — R112 Nausea with vomiting, unspecified: Secondary | ICD-10-CM

## 2020-03-05 DIAGNOSIS — N183 Chronic kidney disease, stage 3 unspecified: Secondary | ICD-10-CM | POA: Insufficient documentation

## 2020-03-05 LAB — URINALYSIS, ROUTINE W REFLEX MICROSCOPIC
Bilirubin Urine: NEGATIVE
Glucose, UA: >=500 mg/dL — AB
Ketones, ur: 5 mg/dL — AB
Leukocytes,Ua: NEGATIVE
Nitrite: NEGATIVE
Protein, ur: >=300 mg/dL — AB
Specific Gravity, Urine: 1.008 (ref 1.005–1.030)
pH: 8 (ref 5.0–8.0)

## 2020-03-05 LAB — CBC WITH DIFFERENTIAL/PLATELET
Abs Immature Granulocytes: 0.05 10*3/uL (ref 0.00–0.07)
Basophils Absolute: 0.1 10*3/uL (ref 0.0–0.1)
Basophils Relative: 1 %
Eosinophils Absolute: 0.3 10*3/uL (ref 0.0–0.5)
Eosinophils Relative: 3 %
HCT: 30.5 % — ABNORMAL LOW (ref 36.0–46.0)
Hemoglobin: 9.8 g/dL — ABNORMAL LOW (ref 12.0–15.0)
Immature Granulocytes: 1 %
Lymphocytes Relative: 15 %
Lymphs Abs: 1.7 10*3/uL (ref 0.7–4.0)
MCH: 27.4 pg (ref 26.0–34.0)
MCHC: 32.1 g/dL (ref 30.0–36.0)
MCV: 85.2 fL (ref 80.0–100.0)
Monocytes Absolute: 0.4 10*3/uL (ref 0.1–1.0)
Monocytes Relative: 4 %
Neutro Abs: 8.4 10*3/uL — ABNORMAL HIGH (ref 1.7–7.7)
Neutrophils Relative %: 76 %
Platelets: 332 10*3/uL (ref 150–400)
RBC: 3.58 MIL/uL — ABNORMAL LOW (ref 3.87–5.11)
RDW: 15 % (ref 11.5–15.5)
WBC: 10.8 10*3/uL — ABNORMAL HIGH (ref 4.0–10.5)
nRBC: 0 % (ref 0.0–0.2)

## 2020-03-05 LAB — COMPREHENSIVE METABOLIC PANEL
ALT: 9 U/L (ref 0–44)
AST: 10 U/L — ABNORMAL LOW (ref 15–41)
Albumin: 2.4 g/dL — ABNORMAL LOW (ref 3.5–5.0)
Alkaline Phosphatase: 121 U/L (ref 38–126)
Anion gap: 8 (ref 5–15)
BUN: 25 mg/dL — ABNORMAL HIGH (ref 6–20)
CO2: 25 mmol/L (ref 22–32)
Calcium: 8.7 mg/dL — ABNORMAL LOW (ref 8.9–10.3)
Chloride: 103 mmol/L (ref 98–111)
Creatinine, Ser: 2.14 mg/dL — ABNORMAL HIGH (ref 0.44–1.00)
GFR, Estimated: 31 mL/min — ABNORMAL LOW (ref >60)
Glucose, Bld: 228 mg/dL — ABNORMAL HIGH (ref 70–99)
Potassium: 5.4 mmol/L — ABNORMAL HIGH (ref 3.5–5.1)
Sodium: 136 mmol/L (ref 135–145)
Total Bilirubin: 0.5 mg/dL (ref 0.3–1.2)
Total Protein: 6.6 g/dL (ref 6.5–8.1)

## 2020-03-05 LAB — LIPASE, BLOOD: Lipase: 63 U/L — ABNORMAL HIGH (ref 11–51)

## 2020-03-05 MED ORDER — LACTATED RINGERS IV BOLUS
1000.0000 mL | Freq: Once | INTRAVENOUS | Status: AC
  Administered 2020-03-05: 1000 mL via INTRAVENOUS

## 2020-03-05 MED ORDER — PROMETHAZINE HCL 25 MG PO TABS: 25.0000 mg | ORAL_TABLET | Freq: Four times a day (QID) | ORAL | 0 refills | Status: DC | PRN

## 2020-03-05 MED ORDER — METOCLOPRAMIDE HCL 5 MG/ML IJ SOLN
10.0000 mg | Freq: Once | INTRAMUSCULAR | Status: AC
  Administered 2020-03-05: 10 mg via INTRAVENOUS
  Filled 2020-03-05: qty 2

## 2020-03-05 MED ORDER — PROMETHAZINE HCL 25 MG/ML IJ SOLN
25.0000 mg | Freq: Once | INTRAMUSCULAR | Status: AC
  Administered 2020-03-05: 25 mg via INTRAVENOUS
  Filled 2020-03-05: qty 1

## 2020-03-05 NOTE — ED Provider Notes (Signed)
[facility] EMERGENCY DEPARTMENT Provider Note   CSN: [id number] Arrival date & time: 03/05/20  4098     History Chief Complaint  Patient presents with  . Emesis    [facility]ila [facility] is a 34 y.o. female.  HPI 34 year old female presents with vomiting.  She states she has been vomiting ever since being in the hospital a couple weeks ago.  At that time she had Covid-19 as well as intractable vomiting.  Discharged with Phenergan but still vomiting numerous times a day.  No fevers, cough, shortness of breath.  She has gotten over the Covid symptoms otherwise.  No abdominal pain or chest pain.  She was told she was septic.  However she states when she left she did not get any more follow-up on that.  She has a chronic left foot wound that has previously had surgery that has been doing well.  She also has a blood blister to her left heel that is new over the last couple days but no new pain or drainage.  Past Medical History:  Diagnosis Date  . Diabetes mellitus    uncontrolled, last A1c was 14  . E. coli sepsis ([place])   . Ectopic pregnancy   . Essential hypertension, benign 11/01/2008  . Fatty liver   . Gastroparesis   . GERD (gastroesophageal reflux disease)   . Helicobacter pylori gastritis 09/07/2011  . Migraine   . Morbid obesity with body mass index (BMI) of 40.0 to 49.9 ([place])   . Pulmonary edema   . Pyelonephritis   . Ureteral obstruction     Patient Active Problem List   Diagnosis Date Noted  . Nausea & vomiting 10/10/2019  . Hypokalemia 10/10/2019  . CKD (chronic kidney disease), stage III ([place]) 10/10/2019  . CKD (chronic kidney disease), stage III ([place]) 10/10/2019  . Cellulitis of right lower extremity 08/23/2019  . Uncontrolled type 2 diabetes mellitus with hyperglycemia, without long-term current use of insulin ([place]) 08/23/2019  . Intractable nausea and vomiting 08/23/2019  . GERD with esophagitis 08/23/2019  . Lactic acidosis 08/23/2019  . Hyperosmolar hyperglycemic  state (HHS) ([place]) 05/16/2019  . AKI (acute kidney injury) ([place]) 05/16/2019  . S/P AKA (above knee amputation), right ([place]) 05/16/2019  . GERD (gastroesophageal reflux disease)   . Hypertensive urgency   . Benign essential HTN   . Unilateral complete BKA, right, sequela ([place])   . Unilateral complete BKA, right, initial encounter ([place]) 02/09/2019  . Acquired absence of right leg below knee ([place])   . Hyponatremia   . Acute blood loss anemia   . Sinus tachycardia   . Post-operative pain   . Bacteremia   . 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 Streptococcal bacteremia   . Klebsiella pneumoniae infection   . Abscess of leg, right   . Skin ulcer of right foot with necrosis of muscle ([place])   . Severe protein-calorie malnutrition ([place])   . DM (diabetes mellitus) ([place]) 02/01/2019  . Adjustment disorder 02/01/2019  . Sepsis due to cellulitis ([place]) 02/01/2019  . Cellulitis in diabetic foot ([place]) 12/31/2018  . Obesity 12/31/2018  . Facial cellulitis 07/02/2018  . Class 3 severe obesity with body mass index (BMI) of 40.0 to 44.9 in adult ([place]) 07/09/2017  . Abscess of right breast 02/24/2016  . BMI 45.0-49.9, adult ([place]) 09/29/2015  . Skin lesions - right lower extremity 12/07/2013  . Morbid obesity with BMI of 50.0-59.9, adult ([place]) 10/05/2011  . Obstructive sleep apnea 10/05/2011  .  HLD (hyperlipidemia) 09/27/2011  . Migraine 09/07/2011  . Essential hypertension, benign 11/01/2008  . Gastroparesis 09/27/2008  . IRREGULAR MENSES 08/24/2008  . Diabetes mellitus out of control ([place]) 07/04/2006    Past Surgical History:  Procedure Laterality Date  . AMPUTATION Right 02/04/2019   Procedure: RIGHT BELOW KNEE AMPUTATION;  Surgeon: Newt Minion, MD;  Location: [place];  Service: Orthopedics;  Laterality: Right;  . ESOPHAGOGASTRODUODENOSCOPY  2010  . WOUND DEBRIDEMENT Right 01/03/2019   Procedure: DEBRIDEMENT WOUND RIGHT LATERAL FOOT;  Surgeon: Angelia Mould, MD;  [facility] OR;   Service: Vascular;  Laterality: Right;     OB History    Gravida  1   Para      Term      Preterm      AB  1   Living  0     SAB      TAB      Ectopic  1   Multiple      Live Births              Family History  Problem Relation Age of Onset  . Arthritis Mother   . Asthma Mother   . COPD Mother   . Depression Mother   . Diabetes Mother   . Hypertension Mother   . Mental illness Mother   . Kidney disease Mother   . Crohn's disease Mother   . Aneurysm Father 36       brain  . Mental illness Sister   . Arthritis Maternal Aunt   . Asthma Maternal Aunt   . COPD Maternal Aunt   . Depression Maternal Aunt   . Diabetes Maternal Aunt   . Hypertension Maternal Aunt   . Mental illness Maternal Aunt   . Stroke Maternal Aunt   . Heart failure Maternal Aunt   . Heart failure Maternal Grandfather   . Cancer Maternal Grandfather   . Diabetes Maternal Grandfather   . Heart disease Maternal Grandfather   . Hypertension Maternal Grandfather   . Hyperlipidemia Maternal Grandfather   . Mental illness Brother   . Diabetes Maternal Grandmother   . Heart disease Maternal Grandmother   . Hypertension Maternal Grandmother   . Hyperlipidemia Maternal Grandmother   . Diabetes Paternal Grandmother   . Heart disease Paternal Grandmother   . Diabetes Paternal Grandfather   . Cancer Paternal Grandfather     Social History   Tobacco Use  . Smoking status: Never Smoker  . Smokeless tobacco: Never Used  Vaping Use  . Vaping Use: Never used  Substance Use Topics  . Alcohol use: Not Currently  . Drug use: Yes    Types: Cocaine    Home Medications Prior to Admission medications   Medication Sig Start Date End Date Taking? Authorizing Provider  amLODipine (NORVASC) 10 MG tablet Take 10 mg by mouth daily. 10/29/19   [provider]  CVS VITAMIN C 1000 MG tablet Take 1,000 mg by mouth 2 (two) times daily. 10/29/19   [provider]  CVS ZINC GLUCONATE  50 MG tablet Take 50 mg by mouth daily. 10/29/19   [provider]  doxycycline (VIBRA-TABS) 100 MG tablet Take 100 mg by mouth 2 (two) times daily. 11/09/19   [provider]  ferrous sulfate 325 (65 FE) MG tablet Take 325 mg by mouth daily. 10/29/19   [provider]  furosemide (LASIX) 20 MG tablet Take 20 mg by mouth 2 (two) times daily. 11/16/19  [provider]  furosemide (LASIX) 40 MG tablet Take 1.5 tablets (60 mg total) by mouth in the morning and at bedtime for 4 days. Until you see your kidney doctor 11/21/19 11/25/19  Mesner, Corene Cornea, MD  gabapentin (NEURONTIN) 400 MG capsule Take 400 mg by mouth 3 (three) times daily. 11/16/19   [provider]  HUMALOG 100 UNIT/ML injection Inject 2-5 Units into the skin 3 (three) times daily. 11/16/19   [provider]  hydrALAZINE (APRESOLINE) 25 MG tablet Take 25 mg by mouth every 8 (eight) hours. 11/16/19   [provider]  ibuprofen (ADVIL) 200 MG tablet Take 400 mg by mouth daily as needed for moderate pain.    [provider]  LANTUS 100 UNIT/ML injection Inject 20 Units into the skin at bedtime. 11/16/19   [provider]  linezolid (ZYVOX) 600 MG tablet Take 600 mg by mouth 2 (two) times daily. 11/16/19   [provider]    Allergies    Bee venom, Penicillins, and Lovenox [enoxaparin sodium]  Review of Systems   Review of Systems  Constitutional: Negative for fever.  Respiratory: Negative for cough and shortness of breath.   Cardiovascular: Negative for chest pain.  Gastrointestinal: Positive for vomiting. Negative for abdominal pain and diarrhea.  Skin: Positive for wound.  All other systems reviewed and are negative.   Physical Exam Updated Vital Signs BP (!) 213/115   Pulse (!) 101   Temp 98.7 F (37.1 C) (Oral)   Resp 17   Ht 5\' 1"  (1.549 m)   Wt (!) 137 kg   LMP 02/20/2020   SpO2 100%   BMI 57.07 kg/m   Physical Exam Vitals and nursing  note reviewed.  Constitutional:      Appearance: She is well-developed. She is obese.  HENT:     Head: Normocephalic and atraumatic.     Right Ear: External ear normal.     Left Ear: External ear normal.     Nose: Nose normal.  Eyes:     General:        Right eye: No discharge.        Left eye: No discharge.  Cardiovascular:     Rate and Rhythm: Regular rhythm. Tachycardia present.     Heart sounds: Normal heart sounds.  Pulmonary:     Effort: Pulmonary effort is normal.     Breath sounds: Normal breath sounds.  Abdominal:     Palpations: Abdomen is soft.     Tenderness: There is no abdominal tenderness.  Musculoskeletal:     Comments: Left foot is tightly wrapped with coban. When removed she has superficial wounds to left dorsal and plantar foot. Blood blister to heal. No tenderness, drainage or erythema/cellulitis  Skin:    General: Skin is warm and dry.  Neurological:     Mental Status: She is alert.  Psychiatric:        Mood and Affect: Mood is not anxious.     ED Results / Procedures / Treatments   Labs (all labs ordered are listed, but only abnormal results are displayed) Labs Reviewed  COMPREHENSIVE METABOLIC PANEL - Abnormal; Notable for the following components:      Result Value   Potassium 5.4 (*)    Glucose, Bld 228 (*)    BUN 25 (*)    Creatinine, Ser 2.14 (*)    Calcium 8.7 (*)    Albumin 2.4 (*)    AST 10 (*)    GFR, Estimated  31 (*)    All other components within normal limits  LIPASE, BLOOD - Abnormal; Notable for the following components:   Lipase 63 (*)    All other components within normal limits  CBC WITH DIFFERENTIAL/PLATELET - Abnormal; Notable for the following components:   WBC 10.8 (*)    RBC 3.58 (*)    Hemoglobin 9.8 (*)    HCT 30.5 (*)    Neutro Abs 8.4 (*)    All other components within normal limits  URINALYSIS, ROUTINE W REFLEX MICROSCOPIC  I-STAT BETA HCG BLOOD, ED ([facility], [facility], [facility] ONLY)    EKG None  Radiology No results  found.  Procedures Ultrasound ED Peripheral IV (Provider)  Date/Time: 03/05/2020 1:32 PM Performed by: Sherwood Gambler, MD Authorized by: Sherwood Gambler, MD   Procedure details:    Indications: multiple failed IV attempts and poor IV access     Skin Prep: chlorhexidine gluconate     Location:  Left AC   Angiocath:  20 G   Bedside Ultrasound Guided: Yes     Patient tolerated procedure without complications: Yes     Dressing applied: Yes     (including critical care time)  Medications Ordered in ED Medications  promethazine (PHENERGAN) injection 25 mg (has no administration in time range)  metoCLOPramide (REGLAN) injection 10 mg (10 mg Intravenous Given 03/05/20 1331)  lactated ringers bolus 1,000 mL (1,000 mLs Intravenous New Bag/Given 03/05/20 1244)    ED Course  I have reviewed the triage vital signs and the nursing notes.  Pertinent labs & imaging results that were available during my care of the patient were reviewed by me and considered in my medical decision making (see chart for details).    MDM Rules/Calculators/A&P                          Patient's labs are near her baseline.  No obvious infection at this time.  Leukocytosis is probably more reactive to vomiting than it is infection given no clear source with her foot looking okay and no abdominal pain.  Still waiting on urine but otherwise will attempt Phenergan after the Reglan gave only partial nausea relief.  Care transferred to Dr. Stark Jock. Final Clinical Impression(s) / ED Diagnoses Final diagnoses:  None    Rx / DC Orders ED Discharge Orders    None       Sherwood Gambler, MD 03/05/20 1538

## 2020-03-05 NOTE — ED Triage Notes (Signed)
Pt reports testing positive for COVID on 10/13 and being admitted. Since then pt reports she has been unable to eat or drink anything. Pt also reports a new wound to her left heel.

## 2020-03-05 NOTE — ED Provider Notes (Signed)
Care assumed from Dr. Verner Chol at shift change.  Patient with history of diabetes and gastroparesis.  She has been having issues with vomiting and difficulty keeping fluids down.  Care was signed out to me awaiting antiemetics and fluid challenge.  She is also waiting for urinalysis which was unremarkable.  Patient has been reassessed and has tolerated water.  I see no indication at this time for admission.  Patient will be discharged with Phenergan and as needed return.   Veryl Speak, MD 03/05/20 1736

## 2020-03-05 NOTE — Discharge Instructions (Addendum)
Take Phenergan as prescribed as needed for nausea.  Follow-up with your primary doctor if symptoms are not improving in the next few days.  Return to the ER if you develop worsening symptoms, severe abdominal pain, bloody stool, or other new and concerning symptoms.

## 2020-03-05 NOTE — ED Notes (Signed)
ED Provider at bedside. 

## 2020-04-28 DIAGNOSIS — Z0289 Encounter for other administrative examinations: Secondary | ICD-10-CM

## 2020-09-01 IMAGING — CT CT MAXILLOFACIAL W/ CM
3 of 4 series · 16 of 47 positions shown, 19 images · IV contrast (omnipaque)
Comparison: CT face 04/19/2015

CLINICAL DATA: Dental caries. Recent dental work 06/25/2018. Right
facial swelling

EXAM:
CT MAXILLOFACIAL WITH CONTRAST
TECHNIQUE: Multidetector CT imaging of the maxillofacial structures was
performed with intravenous contrast. Multiplanar CT image
reconstructions were also generated.
CONTRAST:  75mL OMNIPAQUE IOHEXOL 300 MG/ML  SOLN

[Series 4: coronal soft · coronal · 0.37mm/px · 3 of 89 slices shown]
[im 30/89  bone]
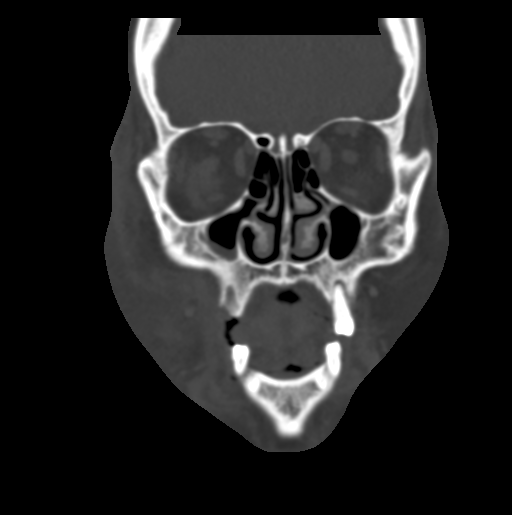
[im 40/89  bone]
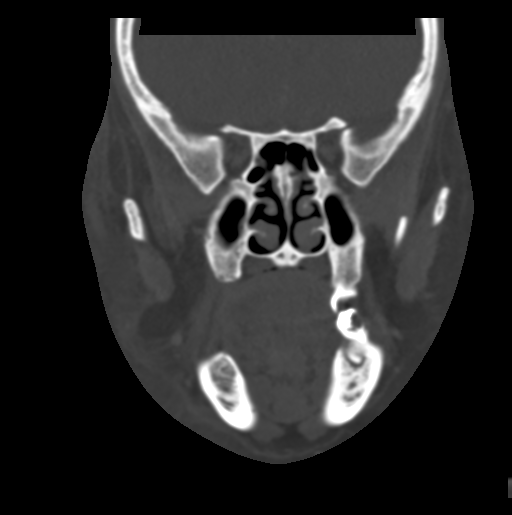
[im 49/89  bone]
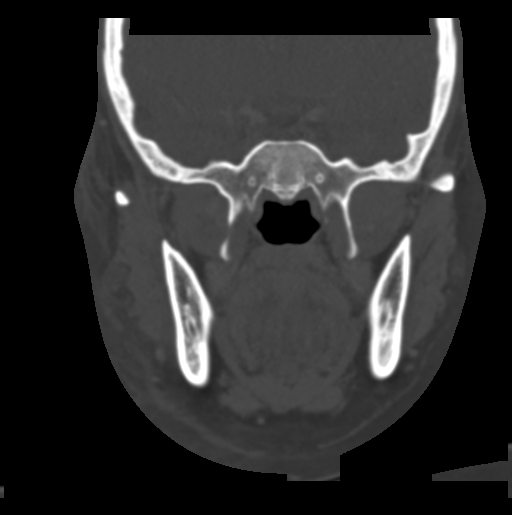

[Series 7: sagittal bone · sagittal · 0.37mm/px · 1 of 86 slices shown]
[im 43/86  bone]
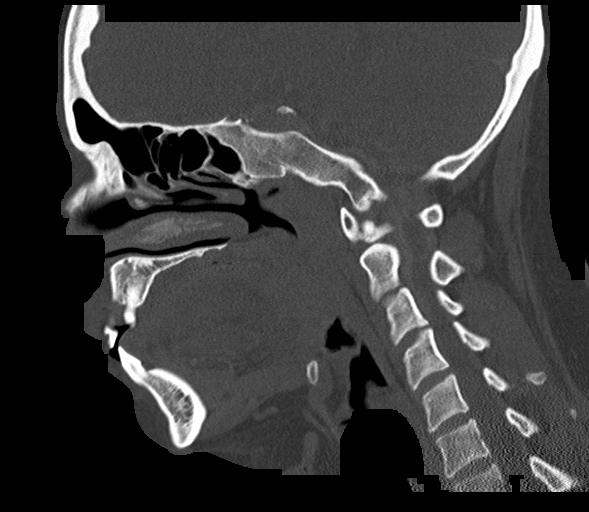

[Series 10: max soft · axial · 0.36mm/px · z∈[+1373,+1525]mm · 12 of 90 slices shown, 15 images]
[im 7/90  brain]
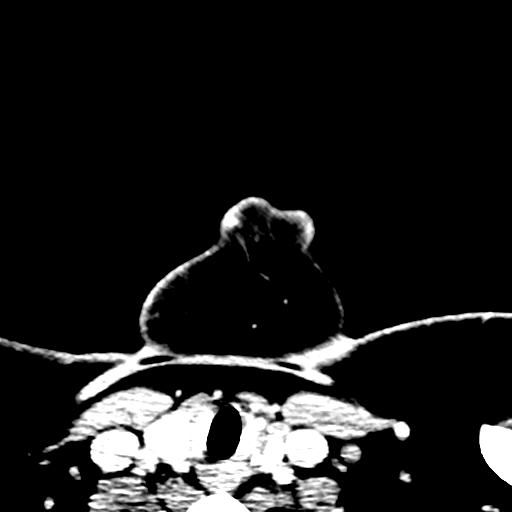
[im 7/90  bone]
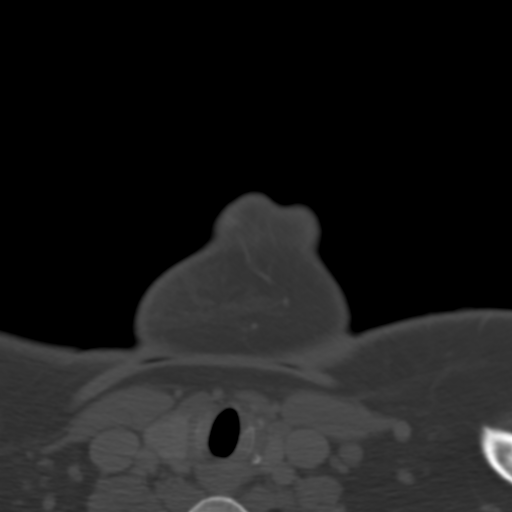
[im 13/90  bone]
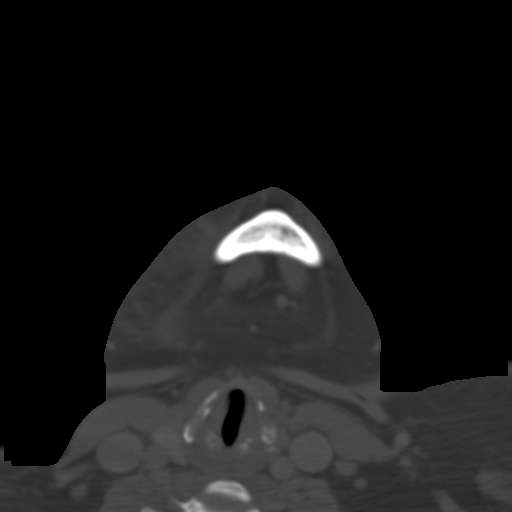
[im 19/90  bone]
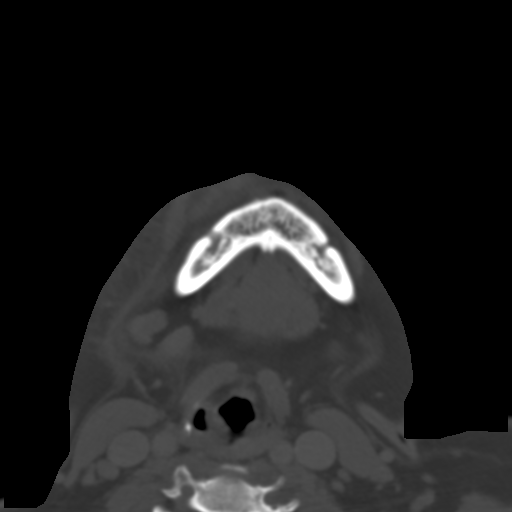
[im 28/90  bone]
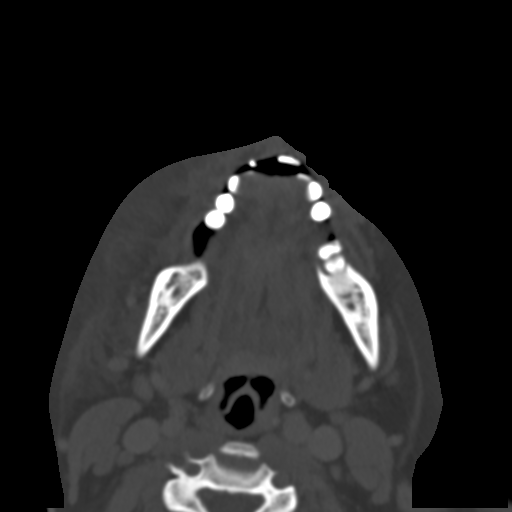
[im 34/90  brain]
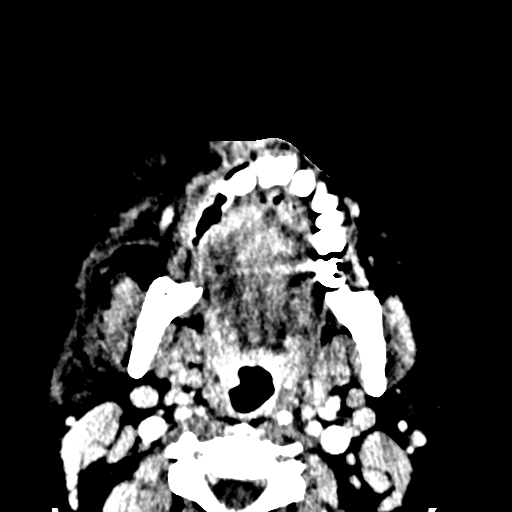
[im 34/90  bone]
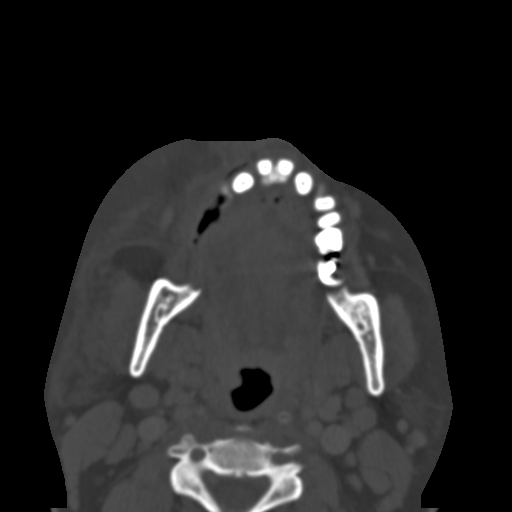
[im 40/90  bone]
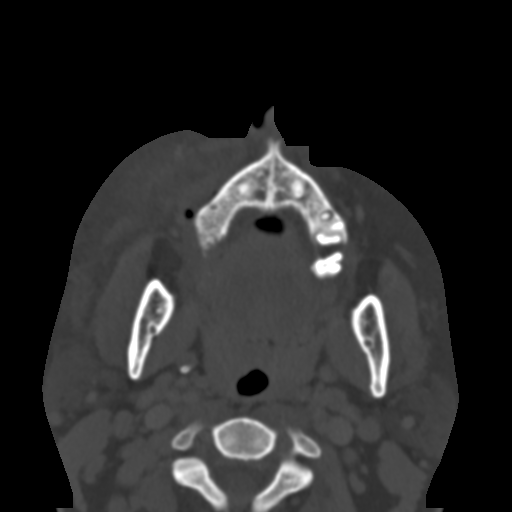
[im 50/90  bone]
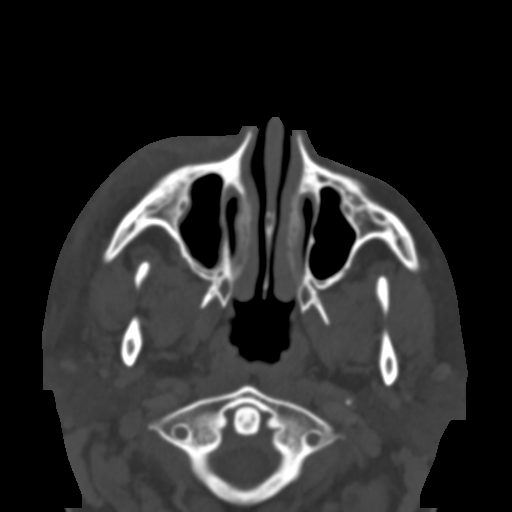
[im 56/90  bone]
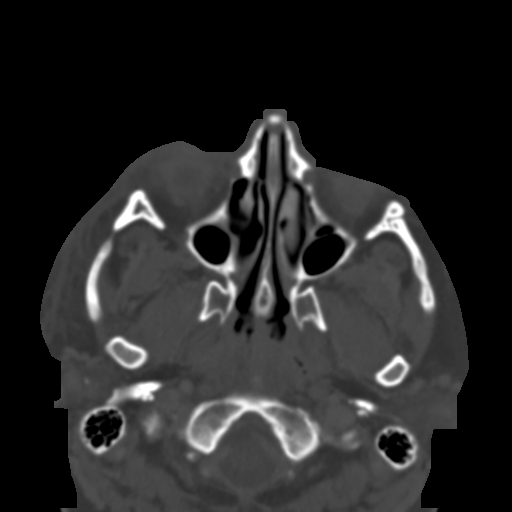
[im 62/90  brain]
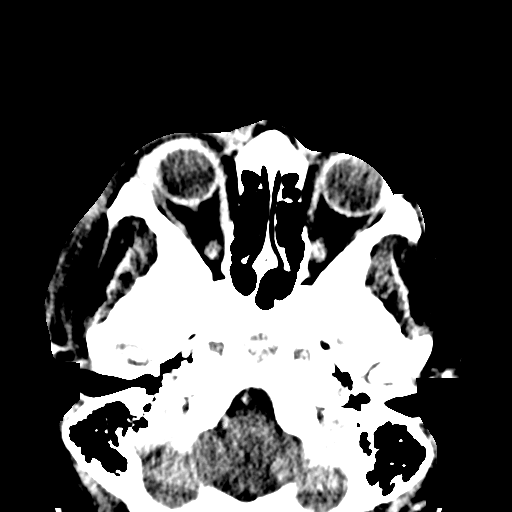
[im 62/90  bone]
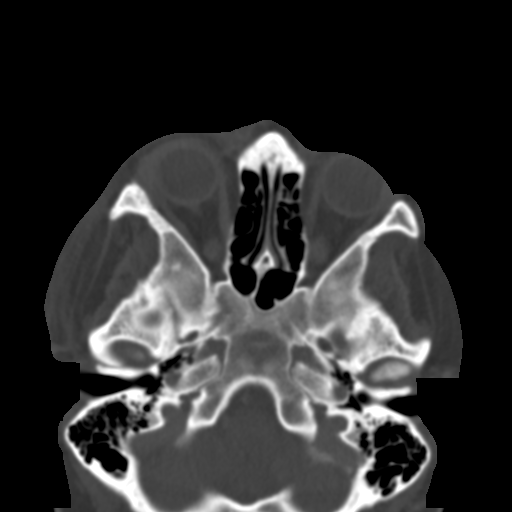
[im 71/90  bone]
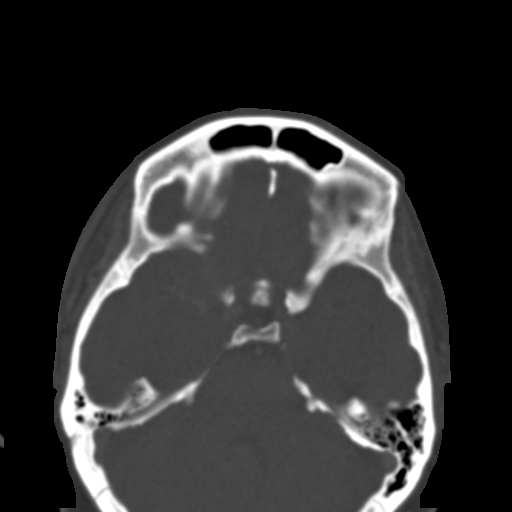
[im 77/90  bone]
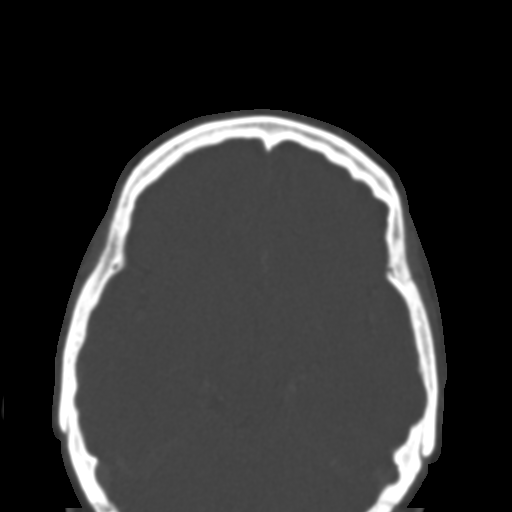
[im 83/90  bone]
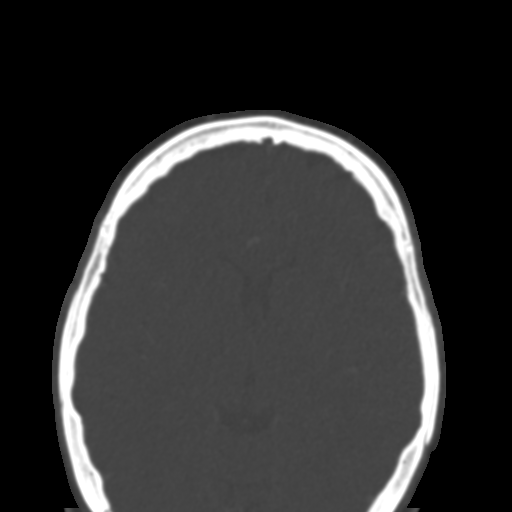

[16 of 47 positions shown; findings below may reference images not displayed]

FINDINGS: Osseous: Multiple caries and multiple periventricular bony erosion
most notably in the left upper and lower molars. Recent extraction
of right upper premolars. Negative for fracture

Orbits: Normal orbit bilaterally. Moderate soft tissue swelling over
the right eye extending into the right face compatible with
cellulitis.

Sinuses: Mild mucosal edema in the right maxillary sinus. Remaining
sinuses clear

Soft tissues: Soft tissue swelling right face diffusely extending
down to the mandible. Soft tissue swelling overlying the right
orbit. Findings compatible with cellulitis. No soft tissue abscess

Scattered small lymph nodes in the neck bilaterally including the
right parotid gland compatible with reactive adenopathy. Normal
airway

Limited intracranial: Negative
IMPRESSION: Soft tissue swelling right face and orbit compatible cellulitis. No
soft tissue mass.

Apparent recent extraction of right upper premolars

Poor dentition with multiple caries. Periapical lucencies around
left upper and lower molars

## 2020-09-24 ENCOUNTER — Inpatient Hospital Stay (HOSPITAL_COMMUNITY): Payer: Medicaid Other

## 2020-09-24 ENCOUNTER — Emergency Department (HOSPITAL_COMMUNITY): Payer: Medicaid Other

## 2020-09-24 ENCOUNTER — Inpatient Hospital Stay (HOSPITAL_COMMUNITY)
Admission: EM | Admit: 2020-09-24 | Discharge: 2020-09-27 | DRG: 074 | Disposition: A | Discharge status: Home Health | Payer: Medicaid Other | Attending: Family Medicine | Admitting: Family Medicine

## 2020-09-24 ENCOUNTER — Other Ambulatory Visit: Payer: Self-pay

## 2020-09-24 DIAGNOSIS — R748 Abnormal levels of other serum enzymes: Secondary | ICD-10-CM

## 2020-09-24 DIAGNOSIS — Z841 Family history of disorders of kidney and ureter: Secondary | ICD-10-CM

## 2020-09-24 DIAGNOSIS — Z9114 Patient's other noncompliance with medication regimen: Secondary | ICD-10-CM | POA: Diagnosis not present

## 2020-09-24 DIAGNOSIS — E1165 Type 2 diabetes mellitus with hyperglycemia: Secondary | ICD-10-CM

## 2020-09-24 DIAGNOSIS — D631 Anemia in chronic kidney disease: Secondary | ICD-10-CM | POA: Diagnosis present

## 2020-09-24 DIAGNOSIS — D72829 Elevated white blood cell count, unspecified: Secondary | ICD-10-CM

## 2020-09-24 DIAGNOSIS — R112 Nausea with vomiting, unspecified: Secondary | ICD-10-CM | POA: Diagnosis present

## 2020-09-24 DIAGNOSIS — Z8701 Personal history of pneumonia (recurrent): Secondary | ICD-10-CM

## 2020-09-24 DIAGNOSIS — D509 Iron deficiency anemia, unspecified: Secondary | ICD-10-CM | POA: Diagnosis present

## 2020-09-24 DIAGNOSIS — I160 Hypertensive urgency: Secondary | ICD-10-CM

## 2020-09-24 DIAGNOSIS — R778 Other specified abnormalities of plasma proteins: Secondary | ICD-10-CM | POA: Diagnosis not present

## 2020-09-24 DIAGNOSIS — N189 Chronic kidney disease, unspecified: Secondary | ICD-10-CM

## 2020-09-24 DIAGNOSIS — N184 Chronic kidney disease, stage 4 (severe): Secondary | ICD-10-CM | POA: Diagnosis present

## 2020-09-24 DIAGNOSIS — J9611 Chronic respiratory failure with hypoxia: Secondary | ICD-10-CM

## 2020-09-24 DIAGNOSIS — R339 Retention of urine, unspecified: Secondary | ICD-10-CM | POA: Diagnosis present

## 2020-09-24 DIAGNOSIS — Z8249 Family history of ischemic heart disease and other diseases of the circulatory system: Secondary | ICD-10-CM

## 2020-09-24 DIAGNOSIS — Z6841 Body Mass Index (BMI) 40.0 and over, adult: Secondary | ICD-10-CM

## 2020-09-24 DIAGNOSIS — I129 Hypertensive chronic kidney disease with stage 1 through stage 4 chronic kidney disease, or unspecified chronic kidney disease: Secondary | ICD-10-CM | POA: Diagnosis present

## 2020-09-24 DIAGNOSIS — K219 Gastro-esophageal reflux disease without esophagitis: Secondary | ICD-10-CM | POA: Diagnosis not present

## 2020-09-24 DIAGNOSIS — Z9981 Dependence on supplemental oxygen: Secondary | ICD-10-CM

## 2020-09-24 DIAGNOSIS — K5900 Constipation, unspecified: Secondary | ICD-10-CM | POA: Diagnosis present

## 2020-09-24 DIAGNOSIS — Z880 Allergy status to penicillin: Secondary | ICD-10-CM

## 2020-09-24 DIAGNOSIS — E8809 Other disorders of plasma-protein metabolism, not elsewhere classified: Secondary | ICD-10-CM | POA: Diagnosis present

## 2020-09-24 DIAGNOSIS — J811 Chronic pulmonary edema: Secondary | ICD-10-CM

## 2020-09-24 DIAGNOSIS — I5021 Acute systolic (congestive) heart failure: Secondary | ICD-10-CM

## 2020-09-24 DIAGNOSIS — R161 Splenomegaly, not elsewhere classified: Secondary | ICD-10-CM | POA: Diagnosis present

## 2020-09-24 DIAGNOSIS — R338 Other retention of urine: Secondary | ICD-10-CM | POA: Diagnosis present

## 2020-09-24 DIAGNOSIS — Z89511 Acquired absence of right leg below knee: Secondary | ICD-10-CM

## 2020-09-24 DIAGNOSIS — E46 Unspecified protein-calorie malnutrition: Secondary | ICD-10-CM

## 2020-09-24 DIAGNOSIS — Z91148 Patient's other noncompliance with medication regimen for other reason: Secondary | ICD-10-CM

## 2020-09-24 DIAGNOSIS — E441 Mild protein-calorie malnutrition: Secondary | ICD-10-CM | POA: Diagnosis present

## 2020-09-24 DIAGNOSIS — E1143 Type 2 diabetes mellitus with diabetic autonomic (poly)neuropathy: Secondary | ICD-10-CM | POA: Diagnosis not present

## 2020-09-24 DIAGNOSIS — E1122 Type 2 diabetes mellitus with diabetic chronic kidney disease: Secondary | ICD-10-CM | POA: Diagnosis present

## 2020-09-24 DIAGNOSIS — R7989 Other specified abnormal findings of blood chemistry: Secondary | ICD-10-CM

## 2020-09-24 DIAGNOSIS — D649 Anemia, unspecified: Secondary | ICD-10-CM

## 2020-09-24 DIAGNOSIS — Z8619 Personal history of other infectious and parasitic diseases: Secondary | ICD-10-CM

## 2020-09-24 DIAGNOSIS — N179 Acute kidney failure, unspecified: Secondary | ICD-10-CM

## 2020-09-24 DIAGNOSIS — G43909 Migraine, unspecified, not intractable, without status migrainosus: Secondary | ICD-10-CM | POA: Diagnosis present

## 2020-09-24 DIAGNOSIS — Z794 Long term (current) use of insulin: Secondary | ICD-10-CM

## 2020-09-24 DIAGNOSIS — E860 Dehydration: Secondary | ICD-10-CM | POA: Diagnosis present

## 2020-09-24 DIAGNOSIS — Z20822 Contact with and (suspected) exposure to covid-19: Secondary | ICD-10-CM | POA: Diagnosis present

## 2020-09-24 DIAGNOSIS — Z79899 Other long term (current) drug therapy: Secondary | ICD-10-CM

## 2020-09-24 DIAGNOSIS — E875 Hyperkalemia: Secondary | ICD-10-CM | POA: Diagnosis present

## 2020-09-24 DIAGNOSIS — I4891 Unspecified atrial fibrillation: Secondary | ICD-10-CM | POA: Diagnosis present

## 2020-09-24 DIAGNOSIS — K3184 Gastroparesis: Secondary | ICD-10-CM | POA: Diagnosis present

## 2020-09-24 DIAGNOSIS — Z888 Allergy status to other drugs, medicaments and biological substances status: Secondary | ICD-10-CM

## 2020-09-24 DIAGNOSIS — Z87440 Personal history of urinary (tract) infections: Secondary | ICD-10-CM

## 2020-09-24 DIAGNOSIS — J810 Acute pulmonary edema: Secondary | ICD-10-CM

## 2020-09-24 DIAGNOSIS — Z91030 Bee allergy status: Secondary | ICD-10-CM

## 2020-09-24 DIAGNOSIS — Z833 Family history of diabetes mellitus: Secondary | ICD-10-CM

## 2020-09-24 DIAGNOSIS — K760 Fatty (change of) liver, not elsewhere classified: Secondary | ICD-10-CM | POA: Diagnosis present

## 2020-09-24 DIAGNOSIS — I169 Hypertensive crisis, unspecified: Secondary | ICD-10-CM

## 2020-09-24 DIAGNOSIS — N920 Excessive and frequent menstruation with regular cycle: Secondary | ICD-10-CM | POA: Diagnosis present

## 2020-09-24 LAB — TROPONIN I (HIGH SENSITIVITY)
Troponin I (High Sensitivity): 22 ng/L — ABNORMAL HIGH (ref <18)
Troponin I (High Sensitivity): 23 ng/L — ABNORMAL HIGH (ref <18)

## 2020-09-24 LAB — CBC WITH DIFFERENTIAL/PLATELET
Abs Immature Granulocytes: 0.08 10*3/uL — ABNORMAL HIGH (ref 0.00–0.07)
Basophils Absolute: 0 10*3/uL (ref 0.0–0.1)
Basophils Relative: 0 %
Eosinophils Absolute: 0 10*3/uL (ref 0.0–0.5)
Eosinophils Relative: 0 %
HCT: 29.3 % — ABNORMAL LOW (ref 36.0–46.0)
Hemoglobin: 9.3 g/dL — ABNORMAL LOW (ref 12.0–15.0)
Immature Granulocytes: 1 %
Lymphocytes Relative: 5 %
Lymphs Abs: 0.9 10*3/uL (ref 0.7–4.0)
MCH: 28.5 pg (ref 26.0–34.0)
MCHC: 31.7 g/dL (ref 30.0–36.0)
MCV: 89.9 fL (ref 80.0–100.0)
Monocytes Absolute: 0.4 10*3/uL (ref 0.1–1.0)
Monocytes Relative: 3 %
Neutro Abs: 14.8 10*3/uL — ABNORMAL HIGH (ref 1.7–7.7)
Neutrophils Relative %: 91 %
Platelets: 290 10*3/uL (ref 150–400)
RBC: 3.26 MIL/uL — ABNORMAL LOW (ref 3.87–5.11)
RDW: 17.1 % — ABNORMAL HIGH (ref 11.5–15.5)
WBC: 16.2 10*3/uL — ABNORMAL HIGH (ref 4.0–10.5)
nRBC: 0 % (ref 0.0–0.2)

## 2020-09-24 LAB — RESP PANEL BY RT-PCR (FLU A&B, COVID) ARPGX2
Influenza A by PCR: NEGATIVE
Influenza B by PCR: NEGATIVE
SARS Coronavirus 2 by RT PCR: NEGATIVE

## 2020-09-24 LAB — COMPREHENSIVE METABOLIC PANEL
ALT: 11 U/L (ref 0–44)
AST: 15 U/L (ref 15–41)
Albumin: 2.7 g/dL — ABNORMAL LOW (ref 3.5–5.0)
Alkaline Phosphatase: 120 U/L (ref 38–126)
Anion gap: 12 (ref 5–15)
BUN: 53 mg/dL — ABNORMAL HIGH (ref 6–20)
CO2: 19 mmol/L — ABNORMAL LOW (ref 22–32)
Calcium: 8.1 mg/dL — ABNORMAL LOW (ref 8.9–10.3)
Chloride: 108 mmol/L (ref 98–111)
Creatinine, Ser: 4.38 mg/dL — ABNORMAL HIGH (ref 0.44–1.00)
GFR, Estimated: 13 mL/min — ABNORMAL LOW (ref >60)
Glucose, Bld: 211 mg/dL — ABNORMAL HIGH (ref 70–99)
Potassium: 5.6 mmol/L — ABNORMAL HIGH (ref 3.5–5.1)
Sodium: 139 mmol/L (ref 135–145)
Total Bilirubin: 0.8 mg/dL (ref 0.3–1.2)
Total Protein: 6.9 g/dL (ref 6.5–8.1)

## 2020-09-24 LAB — ECHOCARDIOGRAM COMPLETE
Area-P 1/2: 4.93 cm2
Height: 62 in
S' Lateral: 2.94 cm
Weight: 5280 oz

## 2020-09-24 LAB — RENAL FUNCTION PANEL
Albumin: 2.5 g/dL — ABNORMAL LOW (ref 3.5–5.0)
Anion gap: 10 (ref 5–15)
BUN: 55 mg/dL — ABNORMAL HIGH (ref 6–20)
CO2: 19 mmol/L — ABNORMAL LOW (ref 22–32)
Calcium: 7.8 mg/dL — ABNORMAL LOW (ref 8.9–10.3)
Chloride: 108 mmol/L (ref 98–111)
Creatinine, Ser: 4.42 mg/dL — ABNORMAL HIGH (ref 0.44–1.00)
GFR, Estimated: 13 mL/min — ABNORMAL LOW (ref >60)
Glucose, Bld: 220 mg/dL — ABNORMAL HIGH (ref 70–99)
Phosphorus: 6.9 mg/dL — ABNORMAL HIGH (ref 2.5–4.6)
Potassium: 5.6 mmol/L — ABNORMAL HIGH (ref 3.5–5.1)
Sodium: 137 mmol/L (ref 135–145)

## 2020-09-24 LAB — URINALYSIS, ROUTINE W REFLEX MICROSCOPIC
Bilirubin Urine: NEGATIVE
Glucose, UA: >=500 mg/dL — AB
Ketones, ur: 5 mg/dL — AB
Leukocytes,Ua: NEGATIVE
Nitrite: NEGATIVE
Protein, ur: >=300 mg/dL — AB
Specific Gravity, Urine: 1.018 (ref 1.005–1.030)
pH: 5 (ref 5.0–8.0)

## 2020-09-24 LAB — BLOOD GAS, VENOUS
Acid-base deficit: 6.7 mmol/L — ABNORMAL HIGH (ref 0.0–2.0)
Bicarbonate: 18.6 mmol/L — ABNORMAL LOW (ref 20.0–28.0)
FIO2: 36
O2 Saturation: 79 %
Patient temperature: 36.6
pCO2, Ven: 36.9 mmHg — ABNORMAL LOW (ref 44.0–60.0)
pH, Ven: 7.316 (ref 7.250–7.430)
pO2, Ven: 48.5 mmHg — ABNORMAL HIGH (ref 32.0–45.0)

## 2020-09-24 LAB — GLUCOSE, CAPILLARY
Glucose-Capillary: 171 mg/dL — ABNORMAL HIGH (ref 70–99)
Glucose-Capillary: 155 mg/dL — ABNORMAL HIGH (ref 70–99)

## 2020-09-24 LAB — RAPID URINE DRUG SCREEN, HOSP PERFORMED
Amphetamines: NOT DETECTED
Barbiturates: NOT DETECTED
Benzodiazepines: NOT DETECTED
Cocaine: NOT DETECTED
Opiates: NOT DETECTED
Tetrahydrocannabinol: NOT DETECTED

## 2020-09-24 LAB — CBG MONITORING, ED: Glucose-Capillary: 191 mg/dL — ABNORMAL HIGH (ref 70–99)

## 2020-09-24 LAB — HIV ANTIBODY (ROUTINE TESTING W REFLEX): HIV Screen 4th Generation wRfx: NONREACTIVE

## 2020-09-24 LAB — PHOSPHORUS: Phosphorus: 6.4 mg/dL — ABNORMAL HIGH (ref 2.5–4.6)

## 2020-09-24 LAB — LIPASE, BLOOD: Lipase: 58 U/L — ABNORMAL HIGH (ref 11–51)

## 2020-09-24 LAB — MAGNESIUM: Magnesium: 1.7 mg/dL (ref 1.7–2.4)

## 2020-09-24 LAB — MRSA PCR SCREENING: MRSA by PCR: POSITIVE — AB

## 2020-09-24 MED ORDER — INSULIN ASPART 100 UNIT/ML IJ SOLN: 0.0000 [IU] | Freq: Every day | INTRAMUSCULAR | Status: DC

## 2020-09-24 MED ORDER — DIPHENHYDRAMINE HCL 25 MG PO CAPS: 25.0000 mg | ORAL_CAPSULE | Freq: Four times a day (QID) | ORAL | Status: DC | PRN

## 2020-09-24 MED ORDER — LACTATED RINGERS IV BOLUS
1000.0000 mL | Freq: Once | INTRAVENOUS | Status: AC
  Administered 2020-09-24: 1000 mL via INTRAVENOUS

## 2020-09-24 MED ORDER — CARVEDILOL 12.5 MG PO TABS
12.5000 mg | ORAL_TABLET | Freq: Two times a day (BID) | ORAL | Status: DC
  Administered 2020-09-24 – 2020-09-27 (×7): 12.5 mg via ORAL
  Filled 2020-09-24 (×2): qty 1
  Filled 2020-09-24: qty 4
  Filled 2020-09-24 (×5): qty 1

## 2020-09-24 MED ORDER — CLONIDINE HCL 0.2 MG PO TABS
0.2000 mg | ORAL_TABLET | Freq: Once | ORAL | Status: AC
  Administered 2020-09-24: 0.2 mg via ORAL
  Filled 2020-09-24: qty 1

## 2020-09-24 MED ORDER — AMLODIPINE BESYLATE 5 MG PO TABS
10.0000 mg | ORAL_TABLET | Freq: Every day | ORAL | Status: DC
  Administered 2020-09-24 – 2020-09-27 (×4): 10 mg via ORAL
  Filled 2020-09-24 (×4): qty 2

## 2020-09-24 MED ORDER — ONDANSETRON 8 MG PO TBDP: 8.0000 mg | ORAL_TABLET | Freq: Once | ORAL | Status: DC

## 2020-09-24 MED ORDER — METOCLOPRAMIDE HCL 5 MG/ML IJ SOLN
10.0000 mg | Freq: Four times a day (QID) | INTRAMUSCULAR | Status: DC | PRN
  Administered 2020-09-24: 10 mg via INTRAVENOUS
  Filled 2020-09-24: qty 2

## 2020-09-24 MED ORDER — INSULIN ASPART 100 UNIT/ML IJ SOLN
0.0000 [IU] | Freq: Three times a day (TID) | INTRAMUSCULAR | Status: DC
  Administered 2020-09-24: 3 [IU] via SUBCUTANEOUS
  Administered 2020-09-25 – 2020-09-27 (×2): 2 [IU] via SUBCUTANEOUS

## 2020-09-24 MED ORDER — HYDRALAZINE HCL 25 MG PO TABS
100.0000 mg | ORAL_TABLET | Freq: Three times a day (TID) | ORAL | Status: DC
  Administered 2020-09-24 – 2020-09-27 (×11): 100 mg via ORAL
  Filled 2020-09-24 (×11): qty 4

## 2020-09-24 MED ORDER — HYDRALAZINE HCL 25 MG PO TABS
50.0000 mg | ORAL_TABLET | Freq: Three times a day (TID) | ORAL | Status: DC
  Filled 2020-09-24: qty 2

## 2020-09-24 MED ORDER — PROCHLORPERAZINE EDISYLATE 10 MG/2ML IJ SOLN
10.0000 mg | Freq: Once | INTRAMUSCULAR | Status: AC
  Administered 2020-09-24: 10 mg via INTRAVENOUS
  Filled 2020-09-24: qty 2

## 2020-09-24 MED ORDER — ISOSORBIDE MONONITRATE ER 60 MG PO TB24
30.0000 mg | ORAL_TABLET | Freq: Every day | ORAL | Status: DC
  Administered 2020-09-24 – 2020-09-27 (×4): 30 mg via ORAL
  Filled 2020-09-24 (×4): qty 1

## 2020-09-24 MED ORDER — DILTIAZEM HCL 25 MG/5ML IV SOLN
20.0000 mg | Freq: Once | INTRAVENOUS | Status: AC
  Administered 2020-09-24: 20 mg via INTRAVENOUS
  Filled 2020-09-24: qty 5

## 2020-09-24 MED ORDER — INSULIN GLARGINE 100 UNIT/ML ~~LOC~~ SOLN
12.0000 [IU] | Freq: Every day | SUBCUTANEOUS | Status: DC
  Administered 2020-09-24: 12 [IU] via SUBCUTANEOUS
  Filled 2020-09-24 (×2): qty 0.12

## 2020-09-24 MED ORDER — NICARDIPINE HCL IN NACL 20-0.86 MG/200ML-% IV SOLN
3.0000 mg/h | INTRAVENOUS | Status: DC
  Administered 2020-09-24: 5 mg/h via INTRAVENOUS
  Filled 2020-09-24: qty 200

## 2020-09-24 MED ORDER — TAMSULOSIN HCL 0.4 MG PO CAPS
0.4000 mg | ORAL_CAPSULE | Freq: Every day | ORAL | Status: DC
  Administered 2020-09-24 – 2020-09-27 (×4): 0.4 mg via ORAL
  Filled 2020-09-24 (×4): qty 1

## 2020-09-24 MED ORDER — ONDANSETRON HCL 4 MG/2ML IJ SOLN
4.0000 mg | Freq: Once | INTRAMUSCULAR | Status: AC
  Administered 2020-09-24: 4 mg via INTRAVENOUS
  Filled 2020-09-24: qty 2

## 2020-09-24 MED ORDER — SODIUM CHLORIDE 0.9 % IV SOLN: INTRAVENOUS | Status: DC

## 2020-09-24 MED ORDER — FUROSEMIDE 10 MG/ML IJ SOLN
40.0000 mg | Freq: Once | INTRAMUSCULAR | Status: AC
  Administered 2020-09-24: 40 mg via INTRAVENOUS
  Filled 2020-09-24: qty 4

## 2020-09-24 MED ORDER — INSULIN ASPART 100 UNIT/ML IJ SOLN
3.0000 [IU] | Freq: Three times a day (TID) | INTRAMUSCULAR | Status: DC
  Administered 2020-09-25 – 2020-09-27 (×8): 3 [IU] via SUBCUTANEOUS

## 2020-09-24 MED ORDER — PROCHLORPERAZINE 25 MG RE SUPP
25.0000 mg | Freq: Once | RECTAL | Status: DC
  Filled 2020-09-24: qty 1

## 2020-09-24 MED ORDER — LABETALOL HCL 5 MG/ML IV SOLN
20.0000 mg | INTRAVENOUS | Status: DC | PRN
  Administered 2020-09-24 (×3): 20 mg via INTRAVENOUS
  Filled 2020-09-24 (×3): qty 4

## 2020-09-24 MED ORDER — PANTOPRAZOLE SODIUM 40 MG IV SOLR
40.0000 mg | Freq: Every day | INTRAVENOUS | Status: DC
  Administered 2020-09-24: 40 mg via INTRAVENOUS
  Filled 2020-09-24 (×2): qty 40

## 2020-09-24 MED ORDER — LABETALOL HCL 5 MG/ML IV SOLN
10.0000 mg | INTRAVENOUS | Status: DC | PRN
Start: 2020-09-24 — End: 2020-09-28

## 2020-09-24 MED ORDER — MAGNESIUM SULFATE 2 GM/50ML IV SOLN
2.0000 g | Freq: Once | INTRAVENOUS | Status: AC
  Administered 2020-09-24: 2 g via INTRAVENOUS
  Filled 2020-09-24: qty 50

## 2020-09-24 MED ORDER — HYDRALAZINE HCL 25 MG PO TABS
50.0000 mg | ORAL_TABLET | Freq: Once | ORAL | Status: AC
  Administered 2020-09-24: 50 mg via ORAL
  Filled 2020-09-24: qty 2

## 2020-09-24 MED ORDER — CLONIDINE HCL 0.1 MG/24HR TD PTWK
0.1000 mg | MEDICATED_PATCH | TRANSDERMAL | Status: DC
  Administered 2020-09-24: 0.1 mg via TRANSDERMAL
  Filled 2020-09-24 (×2): qty 1

## 2020-09-24 MED ORDER — PROMETHAZINE HCL 25 MG/ML IJ SOLN
INTRAMUSCULAR | Status: AC
  Filled 2020-09-24: qty 1

## 2020-09-24 MED ORDER — SODIUM CHLORIDE 0.9 % IV SOLN
12.5000 mg | Freq: Four times a day (QID) | INTRAVENOUS | Status: DC | PRN
  Administered 2020-09-24: 12.5 mg via INTRAVENOUS
  Filled 2020-09-24: qty 0.5

## 2020-09-24 NOTE — Progress Notes (Addendum)
Patient Demographics:    Sheila Austin, is a 35 y.o. female, DOB - 1985/11/10, [id number]  Admit date - 09/24/2020   Admitting Physician Seira Cody Denton Brick, MD  Outpatient Primary MD for the patient is [facility]  LOS - 0  Chief Complaint  Patient presents with  . Emesis  . Nausea        Subjective:    [employer] today has no fevers,  No chest pain,   -Nausea persist, emesis appears to be resolving with recurrent repeated doses of antiemetics -Having difficulty voiding, bladder scan with about a liter of urine -In and out catheterization ordered  Assessment  & Plan :    Principal Problem:   Intractable nausea and vomiting Active Problems:   Acute urinary retention   Gastroparesis   Pulmonary edema   Hyperkalemia   GERD (gastroesophageal reflux disease)   Hypertensive urgency   Uncontrolled type 2 diabetes mellitus with hyperglycemia, without long-term current use of insulin (HCC)   Leukocytosis   Chronic respiratory failure with hypoxia (HCC)   Hyperphosphatemia   Hypoalbuminemia due to protein-calorie malnutrition (HCC)   Elevated lipase   Elevated troponin   Noncompliance with medication regimen   Acute kidney injury superimposed on CKD ([place])   Hypertensive crisis  Brief Summary:- 35 y.o. female with medical history significant for T2DM, hypertension, CKD stage IV, chronic respiratory failure with supplemental oxygen 3 LPM.  History of noncompliance with medication admitted on 09/24/2020 with intractable emesis, hypertensive urgency , AKI and urinary retention  A/p 1)intractable Emesis--- rectal and IV antiemetics as ordered -Lipase is 58 most likely due to intractable emesis, doubt significant acute pancreatitis - 2)Hypertensive Crisis--- initially in the ED BP was 224/156, repeat BP after interventions in the ED was around 210/115  BP improved on IV Cardene  drip -Patient weaned off Cardene drip -Start clonidine patch 0.1 weekly -Amlodipine 10 mg daily Coreg 12.5 mg twice daily, hydralazine 100 mg 3 times daily and isosorbide 30 mg daily -IV labetalol as needed elevated BP  3)DM1--decrease Lantus insulin to 12 units due to intractable emesis and poor oral intake  Use Novolog/Humalog Sliding scale insulin with Accu-Cheks/Fingersticks as ordered   4)AKI----acute kidney injury on CKD stage -3B with hyperkalemia -This is due to intractable emesis with dehydration    creatinine on admission= 4.38  , baseline creatinine = 1.8 to 2.2 - creatinine is now=4.42   --renally adjust medications, avoid nephrotoxic agents / dehydration  / hypotension -Hydrate, - Give Lokelma and Monitor potassium levels closely  5)Acute Urinary retention -Having difficulty voiding, bladder scan with about a liter of urine -In and out catheterization ordered -Flomax as ordered  6) patient with chronic hypoxic respiratory failure--- at baseline uses oxygen at 2 to 3 L via nasal cannula----clinically doubt significant CHF, echo pending  --Total care time over 43 mins  Disposition/Need for in-Hospital Stay- patient unable to be discharged at this time due to --intractable emesis with worsening renal function and hyperkalemia requiring IV fluids and IV antiemetics -Uncontrolled hypertension with hypertensive crisis requiring IV Cardene drip*  Status is: Inpatient  Remains inpatient appropriate because:Please see disposition above   Disposition: The patient is from: Home  Anticipated d/c is to: Home              Anticipated d/c date is: 2 days              Patient currently is not medically stable to d/c. Barriers: Not Clinically Stable-  Code Status :  -  Code Status: Full Code   Family Communication:    NA (patient is alert, awake and coherent)   Consults  :   --- DVT Prophylaxis  :   - SCD/heparin, right BKA, SCDs Start: 09/24/20 0717    Lab  Results  Component Value Date   PLT 290 09/24/2020    Inpatient Medications  Scheduled Meds: . amLODipine  10 mg Oral Daily  . cloNIDine  0.1 mg Transdermal Weekly  . hydrALAZINE  100 mg Oral TID  . isosorbide mononitrate  30 mg Oral Daily  . pantoprazole (PROTONIX) IV  40 mg Intravenous Daily  . prochlorperazine  25 mg Rectal Once   Continuous Infusions: . sodium chloride 150 mL/hr at 09/24/20 0826  . magnesium sulfate bolus IVPB 2 g (09/24/20 0830)  . promethazine (PHENERGAN) injection (IM or IVPB) Stopped (09/24/20 0544)   PRN Meds:.diphenhydrAMINE, metoCLOPramide (REGLAN) injection, promethazine (PHENERGAN) injection (IM or IVPB)    Anti-infectives (From admission, onward)   None        Objective:   Vitals:   09/24/20 0820 09/24/20 0830 09/24/20 0845 09/24/20 0900  BP: 126/70 130/74 127/82 (!) 146/86  Pulse: (!) 103 (!) 103 99 (!) 108  Resp: (!) 25 (!) 26 (!) 24 (!) 24  Temp:      TempSrc:      SpO2: 98% 98% 97% 97%  Weight:      Height:        Wt Readings from Last 3 Encounters:  09/24/20 (!) 149.7 kg  03/05/20 (!) 137 kg  11/20/19 (!) 137 kg     Intake/Output Summary (Last 24 hours) at 09/24/2020 0904 Last data filed at 09/24/2020 0844 Gross per 24 hour  Intake 1240.35 ml  Output --  Net 1240.35 ml   Physical Exam  Gen:- Awake Alert, morbidly obese, HEENT:- [place].AT, No sclera icterus Nose- [place] 3 L/min Neck-Supple Neck,No JVD,.  Lungs-diminished in bases, no wheezing CV- S1, S2 normal, regular  Abd-  +ve B.Sounds, Abd Soft, No tenderness, increased truncal adiposity Extremity/Skin:-Right BKA, left lower extremity with pedal pulses Psych-affect is appropriate, oriented x3 Neuro-generalized weakness, no new focal deficits, no tremors   Data Review:   Micro Results Recent Results (from the past 240 hour(s))  Resp Panel by RT-PCR (Flu A&B, Covid) Nasopharyngeal Swab     Status: None   Collection Time: 09/24/20 12:47 AM   Specimen: Nasopharyngeal  Swab; Nasopharyngeal(NP) swabs in vial transport medium  Result Value Ref Range Status   SARS Coronavirus 2 by RT PCR NEGATIVE NEGATIVE Final    Comment: (NOTE) SARS-CoV-2 target nucleic acids are NOT DETECTED.  The SARS-CoV-2 RNA is generally detectable in upper respiratory specimens during the acute phase of infection. The lowest concentration of SARS-CoV-2 viral copies this assay can detect is 138 copies/mL. A negative result does not preclude SARS-Cov-2 infection and should not be used as the sole basis for treatment or other patient management decisions. A negative result may occur with  improper specimen collection/handling, submission of specimen other than nasopharyngeal swab, presence of viral mutation(s) within the areas targeted by this assay, and inadequate number of viral copies(<138 copies/mL). A negative result must be combined  with clinical observations, patient history, and epidemiological information. The expected result is Negative.  Fact Sheet for Patients:  EntrepreneurPulse.com.au  Fact Sheet for Healthcare Providers:  IncredibleEmployment.be  This test is no t yet approved or cleared by the [place] FDA and  has been authorized for detection and/or diagnosis of SARS-CoV-2 by FDA under an Emergency Use Authorization (EUA). This EUA will remain  in effect (meaning this test can be used) for the duration of the COVID-19 declaration under Section 564(b)(1) of the Act, 21 U.S.C.section 360bbb-3(b)(1), unless the authorization is terminated  or revoked sooner.       Influenza A by PCR NEGATIVE NEGATIVE Final   Influenza B by PCR NEGATIVE NEGATIVE Final    Comment: (NOTE) The Xpert Xpress SARS-CoV-2/FLU/RSV plus assay is intended as an aid in the diagnosis of influenza from Nasopharyngeal swab specimens and should not be used as a sole basis for treatment. Nasal washings and aspirates are unacceptable for Xpert Xpress  SARS-CoV-2/FLU/RSV testing.  Fact Sheet for Patients: EntrepreneurPulse.com.au  Fact Sheet for Healthcare Providers: IncredibleEmployment.be  This test is not yet approved or cleared by the [place] FDA and has been authorized for detection and/or diagnosis of SARS-CoV-2 by FDA under an Emergency Use Authorization (EUA). This EUA will remain in effect (meaning this test can be used) for the duration of the COVID-19 declaration under Section 564(b)(1) of the Act, 21 U.S.C. section 360bbb-3(b)(1), unless the authorization is terminated or revoked.  Performed at [facility], [address] [place], [place] [address]     Radiology Reports DG Chest [place] 1 View  Result Date: 09/24/2020 CLINICAL DATA:  Shortness of breath EXAM: PORTABLE CHEST 1 VIEW COMPARISON:  05/19/2020 FINDINGS: Mild cardiomegaly with interstitial opacities. No pleural effusion or pneumothorax. IMPRESSION: Mild cardiomegaly and interstitial opacities, likely pulmonary edema. Electronically Signed   By: Ulyses Jarred M.D.   On: 09/24/2020 01:17     CBC Recent Labs  Lab 09/24/20 0044  WBC 16.2*  HGB 9.3*  HCT 29.3*  PLT 290  MCV 89.9  MCH 28.5  MCHC 31.7  RDW 17.1*  LYMPHSABS 0.9  MONOABS 0.4  EOSABS 0.0  BASOSABS 0.0    Chemistries  Recent Labs  Lab 09/24/20 0044 09/24/20 0815  NA 139 137  K 5.6* 5.6*  CL 108 108  CO2 19* 19*  GLUCOSE 211* 220*  BUN 53* 55*  CREATININE 4.38* 4.42*  CALCIUM 8.1* 7.8*  MG 1.7  --   AST 15  --   ALT 11  --   ALKPHOS 120  --   BILITOT 0.8  --    ------------------------------------------------------------------------------------------------------------------ No results for input(s): CHOL, HDL, LDLCALC, TRIG, CHOLHDL, LDLDIRECT in the last 72 hours.  Lab Results  Component Value Date   HGBA1C 9.7 (H) 10/10/2019    ------------------------------------------------------------------------------------------------------------------ No results for input(s): TSH, T4TOTAL, T3FREE, THYROIDAB in the last 72 hours.  Invalid input(s): FREET3 ------------------------------------------------------------------------------------------------------------------ No results for input(s): VITAMINB12, FOLATE, FERRITIN, TIBC, IRON, RETICCTPCT in the last 72 hours.  Coagulation profile No results for input(s): INR, PROTIME in the last 168 hours.  No results for input(s): DDIMER in the last 72 hours.  Cardiac Enzymes No results for input(s): CKMB, TROPONINI, MYOGLOBIN in the last 168 hours.  Invalid input(s): CK ------------------------------------------------------------------------------------------------------------------ No results found for: BNP   Roxan Hockey M.D on 09/24/2020 at 9:04 AM  Go to [email or web address] - for contact info  [facility] - Office  [phone]

## 2020-09-24 NOTE — ED Provider Notes (Signed)
[facility] EMERGENCY DEPARTMENT Provider Note   CSN: [id number] Arrival date & time: 09/24/20  0020     History Chief Complaint  Patient presents with  . Emesis  . Nausea    Sheila Austin is a 35 y.o. female.  The history is provided by the patient.  Emesis She has a history of hypertension, diabetes, chronic kidney disease and comes in because of nausea and vomiting for the last 3 days.  She has not been able to hold anything on her stomach, including her medications.  She denies fever, chills, sweats.  She denies diarrhea.  She has generally decreased urine output from her kidney disease and has not noted any change in her urine output.  She denies any abdominal pain.  She has not been able to treat herself with anything at home because of inability to hold medication down.   Past Medical History:  Diagnosis Date  . Diabetes mellitus    uncontrolled, last A1c was 14  . E. coli sepsis ([place])   . Ectopic pregnancy   . Essential hypertension, benign 11/01/2008  . Fatty liver   . Gastroparesis   . GERD (gastroesophageal reflux disease)   . Helicobacter pylori gastritis 09/07/2011  . Migraine   . Morbid obesity with body mass index (BMI) of 40.0 to 49.9 ([place])   . Pulmonary edema   . Pyelonephritis   . Ureteral obstruction     Patient Active Problem List   Diagnosis Date Noted  . Nausea & vomiting 10/10/2019  . Hypokalemia 10/10/2019  . CKD (chronic kidney disease), stage III ([place]) 10/10/2019  . CKD (chronic kidney disease), stage III ([place]) 10/10/2019  . Cellulitis of right lower extremity 08/23/2019  . Uncontrolled type 2 diabetes mellitus with hyperglycemia, without long-term current use of insulin ([place]) 08/23/2019  . Intractable nausea and vomiting 08/23/2019  . GERD with esophagitis 08/23/2019  . Lactic acidosis 08/23/2019  . Hyperosmolar hyperglycemic state (HHS) ([place]) 05/16/2019  . AKI (acute kidney injury) ([place]) 05/16/2019  . 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 GERD (gastroesophageal reflux disease)   . Hypertensive urgency   . Benign essential HTN   . Unilateral complete BKA, right, sequela ([place])   . Unilateral complete BKA, right, initial encounter ([place]) 02/09/2019  . Acquired absence of right leg below knee ([place])   . Hyponatremia   . Acute blood loss anemia   . Sinus tachycardia   . Post-operative pain   . Bacteremia   . 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 Streptococcal bacteremia   . Klebsiella pneumoniae infection   . Abscess of leg, right   . Skin ulcer of right foot with necrosis of muscle ([place])   . Severe protein-calorie malnutrition ([place])   . DM (diabetes mellitus) ([place]) 02/01/2019  . Adjustment disorder 02/01/2019  . Sepsis due to cellulitis ([place]) 02/01/2019  . Cellulitis in diabetic foot ([place]) 12/31/2018  . Obesity 12/31/2018  . Facial cellulitis 07/02/2018  . Class 3 severe obesity with body mass index (BMI) of 40.0 to 44.9 in adult ([place]) 07/09/2017  . Abscess of right breast 02/24/2016  . BMI 45.0-49.9, adult ([place]) 09/29/2015  . Skin lesions - right lower extremity 12/07/2013  . Morbid obesity with BMI of 50.0-59.9, adult ([place]) 10/05/2011  . Obstructive sleep apnea 10/05/2011  . HLD (hyperlipidemia) 09/27/2011  . Migraine 09/07/2011  . Essential hypertension, benign 11/01/2008  . Gastroparesis 09/27/2008  . IRREGULAR MENSES 08/24/2008  . Diabetes mellitus out  of control ([place]) 07/04/2006    Past Surgical History:  Procedure Laterality Date  . AMPUTATION Right 02/04/2019   Procedure: RIGHT BELOW KNEE AMPUTATION;  Surgeon: Newt Minion, MD;  Location: [facility];  Service: Orthopedics;  Laterality: Right;  . ESOPHAGOGASTRODUODENOSCOPY  2010  . WOUND DEBRIDEMENT Right 01/03/2019   Procedure: DEBRIDEMENT WOUND RIGHT LATERAL FOOT;  Surgeon: Angelia Mould, MD;  Location: [facility] OR;  Service: Vascular;  Laterality: Right;     OB History    Gravida  1   Para      Term      Preterm       AB  1   Living  0     SAB      IAB      Ectopic  1   Multiple      Live Births              Family History  Problem Relation Age of Onset  . Arthritis Mother   . Asthma Mother   . COPD Mother   . Depression Mother   . Diabetes Mother   . Hypertension Mother   . Mental illness Mother   . Kidney disease Mother   . Crohn's disease Mother   . Aneurysm Father 43       brain  . Mental illness Sister   . Arthritis Maternal Aunt   . Asthma Maternal Aunt   . COPD Maternal Aunt   . Depression Maternal Aunt   . Diabetes Maternal Aunt   . Hypertension Maternal Aunt   . Mental illness Maternal Aunt   . Stroke Maternal Aunt   . Heart failure Maternal Aunt   . Heart failure Maternal Grandfather   . Cancer Maternal Grandfather   . Diabetes Maternal Grandfather   . Heart disease Maternal Grandfather   . Hypertension Maternal Grandfather   . Hyperlipidemia Maternal Grandfather   . Mental illness Brother   . Diabetes Maternal Grandmother   . Heart disease Maternal Grandmother   . Hypertension Maternal Grandmother   . Hyperlipidemia Maternal Grandmother   . Diabetes Paternal Grandmother   . Heart disease Paternal Grandmother   . Diabetes Paternal Grandfather   . Cancer Paternal Grandfather     Social History   Tobacco Use  . Smoking status: Never Smoker  . Smokeless tobacco: Never Used  Vaping Use  . Vaping Use: Never used  Substance Use Topics  . Alcohol use: Not Currently  . Drug use: Yes    Types: Cocaine    Home Medications Prior to Admission medications   Medication Sig Start Date End Date Taking? Authorizing Provider  amLODipine (NORVASC) 10 MG tablet Take 10 mg by mouth daily. 10/29/19   [provider]  [employer] VITAMIN C 1000 MG tablet Take 1,000 mg by mouth 2 (two) times daily. 10/29/19   [provider]  [employer] ZINC GLUCONATE 50 MG tablet Take 50 mg by mouth daily. 10/29/19   [provider]  doxycycline (VIBRA-TABS) 100 MG  tablet Take 100 mg by mouth 2 (two) times daily. 11/09/19   [provider]  ferrous sulfate 325 (65 FE) MG tablet Take 325 mg by mouth daily. 10/29/19   [provider]  furosemide (LASIX) 20 MG tablet Take 20 mg by mouth 2 (two) times daily. 11/16/19   [provider]  furosemide (LASIX) 40 MG tablet Take 1.5 tablets (60 mg total) by mouth in the morning and at bedtime for 4 days. Until  you see your kidney doctor 11/21/19 11/25/19  Mesner, Corene Cornea, MD  gabapentin (NEURONTIN) 400 MG capsule Take 400 mg by mouth 3 (three) times daily. 11/16/19   [provider]  HUMALOG 100 UNIT/ML injection Inject 2-5 Units into the skin 3 (three) times daily. 11/16/19   [provider]  hydrALAZINE (APRESOLINE) 25 MG tablet Take 25 mg by mouth every 8 (eight) hours. 11/16/19   [provider]  ibuprofen (ADVIL) 200 MG tablet Take 400 mg by mouth daily as needed for moderate pain.    [provider]  LANTUS 100 UNIT/ML injection Inject 20 Units into the skin at bedtime. 11/16/19   [provider]  linezolid (ZYVOX) 600 MG tablet Take 600 mg by mouth 2 (two) times daily. 11/16/19   [provider]  promethazine (PHENERGAN) 25 MG tablet Take 1 tablet (25 mg total) by mouth every 6 (six) hours as needed for nausea. 03/05/20   Veryl Speak, MD    Allergies    Bee venom, Penicillins, and Lovenox [enoxaparin sodium]  Review of Systems   Review of Systems  Gastrointestinal: Positive for vomiting.  All other systems reviewed and are negative.   Physical Exam Updated Vital Signs BP (!) 229/137   Pulse (!) 121   Temp 97.8 F (36.6 C) (Oral)   Resp (!) 30   Ht '5\' 2"'$  (1.575 m)   Wt (!) 149.7 kg   SpO2 97%   BMI 60.36 kg/m   Physical Exam Vitals and nursing note reviewed.   35 year old female, demonstrating coo small respirations, but is in no acute distress. Vital signs are significant for elevated heart rate, respiratory rate, blood  pressure. Oxygen saturation is 97%, which is normal. Head is normocephalic and atraumatic. PERRLA, EOMI. Oropharynx is clear. Neck is nontender and supple without adenopathy or JVD. Back is nontender and there is no CVA tenderness. Lungs are clear without rales, wheezes, or rhonchi. Chest is nontender. Heart has regular rate and rhythm without murmur. Abdomen is soft, flat, nontender without masses or hepatosplenomegaly and peristalsis is hypoactive. Extremities: Right below the knee amputation, 3+ lymphedema on the left leg. Skin is warm and dry without rash. Neurologic: Mental status is normal, cranial nerves are intact, there are no motor or sensory deficits.  ED Results / Procedures / Treatments   Labs (all labs ordered are listed, but only abnormal results are displayed) Labs Reviewed  COMPREHENSIVE METABOLIC PANEL - Abnormal; Notable for the following components:      Result Value   Potassium 5.6 (*)    CO2 19 (*)    Glucose, Bld 211 (*)    BUN 53 (*)    Creatinine, Ser 4.38 (*)    Calcium 8.1 (*)    Albumin 2.7 (*)    GFR, Estimated 13 (*)    All other components within normal limits  CBC WITH DIFFERENTIAL/PLATELET - Abnormal; Notable for the following components:   WBC 16.2 (*)    RBC 3.26 (*)    Hemoglobin 9.3 (*)    HCT 29.3 (*)    RDW 17.1 (*)    Neutro Abs 14.8 (*)    Abs Immature Granulocytes 0.08 (*)    All other components within normal limits  LIPASE, BLOOD - Abnormal; Notable for the following components:   Lipase 58 (*)    All other components within normal limits  PHOSPHORUS - Abnormal; Notable for the following components:   Phosphorus 6.4 (*)    All other components within  normal limits  BLOOD GAS, VENOUS - Abnormal; Notable for the following components:   pCO2, Ven 36.9 (*)    pO2, Ven 48.5 (*)    Bicarbonate 18.6 (*)    Acid-base deficit 6.7 (*)    All other components within normal limits  CBG MONITORING, ED - Abnormal; Notable for the following  components:   Glucose-Capillary 191 (*)    All other components within normal limits  TROPONIN I (HIGH SENSITIVITY) - Abnormal; Notable for the following components:   Troponin I (High Sensitivity) 22 (*)    All other components within normal limits  RESP PANEL BY RT-PCR (FLU A&B, COVID) ARPGX2  MAGNESIUM    EKG EKG Interpretation  Date/Time:  Saturday Sep 24 2020 00:32:13 EDT Ventricular Rate:  120 PR Interval:  126 QRS Duration: 87 QT Interval:  331 QTC Calculation: 468 R Axis:   70 Text Interpretation: Sinus tachycardia Anterior infarct, old When compared with ECG of 03/05/2020, No significant change was found Confirmed by Delora Fuel [id number]) on 09/24/2020 1:16:33 AM   Radiology DG Chest Port 1 View  Result Date: 09/24/2020 CLINICAL DATA:  Shortness of breath EXAM: PORTABLE CHEST 1 VIEW COMPARISON:  05/19/2020 FINDINGS: Mild cardiomegaly with interstitial opacities. No pleural effusion or pneumothorax. IMPRESSION: Mild cardiomegaly and interstitial opacities, likely pulmonary edema. Electronically Signed   By: Ulyses Jarred M.D.   On: 09/24/2020 01:17    Procedures Procedures  CRITICAL CARE Performed by: Delora Fuel Total critical care time: 135 minutes Critical care time was exclusive of separately billable procedures and treating other patients. Critical care was necessary to treat or prevent imminent or life-threatening deterioration. Critical care was time spent personally by me on the following activities: development of treatment plan with patient and/or surrogate as well as nursing, discussions with consultants, evaluation of patient's response to treatment, examination of patient, obtaining history from patient or surrogate, ordering and performing treatments and interventions, ordering and review of laboratory studies, ordering and review of radiographic studies, pulse oximetry and re-evaluation of patient's condition.  Medications Ordered in ED Medications   promethazine (PHENERGAN) 12.5 mg in sodium chloride 0.9 % 50 mL IVPB (0 mg Intravenous Stopped 09/24/20 0544)  nicardipine (CARDENE) '20mg'$  in 0.86% saline 229m IV infusion (0.1 mg/ml) (5 mg/hr Intravenous New Bag/Given 09/24/20 0638)  lactated ringers bolus 1,000 mL (0 mLs Intravenous Stopped 09/24/20 0239)  ondansetron (ZOFRAN) injection 4 mg (4 mg Intravenous Given 09/24/20 0113)  prochlorperazine (COMPAZINE) injection 10 mg (10 mg Intravenous Given 09/24/20 0222)  diltiazem (CARDIZEM) injection 20 mg (20 mg Intravenous Given 09/24/20 0253)  hydrALAZINE (APRESOLINE) tablet 50 mg (50 mg Oral Given 09/24/20 0349)  cloNIDine (CATAPRES) tablet 0.2 mg (0.2 mg Oral Given 09/24/20 0450)  furosemide (LASIX) injection 40 mg (40 mg Intravenous Given 09/24/20 [id number]    ED Course  I have reviewed the triage vital signs and the nursing notes.  Pertinent labs & imaging results that were available during my care of the patient were reviewed by me and considered in my medical decision making (see chart for details).   MDM Rules/Calculators/A&P                         Nausea and vomiting which has not been able to be controlled at home.  Clinical picture strongly suggestive of ketoacidosis [place] respirations.  Severe hypertension secondary to inability to take medications.  IV started and she started on IV fluids will be given IV ondansetron.  She  will be given labetalol for blood pressure control.  Labs are ordered.  Old records are reviewed, and she has had hospital admissions for hypertensive urgency and gastroparesis.  ECG shows sinus tachycardia.  Chest x-ray shows cardiomegaly, possible early pulmonary edema.  Labs show evidence of acute kidney injury with creatinine 4.38 compared with baseline around 2.2, but no evidence of ketoacidosis.  CO2 is slightly low but anion gap is normal.  Case was discussed with Dr. Josephine Cables who wanted blood pressure better controlled before admitting her.  She was given doses of  labetalol followed by diltiazem, hydralazine, clonidine and blood pressure eventually came down to under [id number] systolic.   Final Clinical Impression(s) / ED Diagnoses Final diagnoses:  Acute renal failure superimposed on chronic kidney disease, unspecified CKD stage, unspecified acute renal failure type (HCC)  Non-intractable vomiting with nausea, unspecified vomiting type  Hypertensive urgency  Normochromic normocytic anemia    Rx / DC Orders ED Discharge Orders    None       Delora Fuel, MD [id number] [phone]

## 2020-09-24 NOTE — ED Notes (Signed)
Pt placed on pure wick at this time.  

## 2020-09-24 NOTE — H&P (Signed)
History and Physical  Sheila Austin QGB:[id number] DOB: 23-Jun-1985 DOA: 09/24/2020  Referring physician: Delora Fuel, MD PCP: [facility]  Patient coming from: Home  Chief Complaint: Nausea and vomiting  HPI: Sheila Austin is a 35 y.o. female with medical history significant for T2DM, hypertension, CKD stage IV, chronic respiratory failure with supplemental oxygen 3 LPM.  History of noncompliance with medication who presents to the emergency department due to 3-day onset of nausea and vomiting, patient states that she has not been able to hold anything down since onset of vomiting which was several episodes daily and was dark brown.  She complains of burning sensation in chest and throat area due to recurrent vomiting.  She denies fever, chills, abdominal pain or diarrhea.  EMS was activated, on arrival of EMS team, patient was noted to be in shortness of breath and supplemental oxygen was increased to 4 LPM.  Patient was taken to the ED for further evaluation and management.  ED Course:  In the emergency department, she was tachycardic, tachypneic and BP was elevated at 224/156.  O2 sat was 95 to 100% on supplemental oxygen at 4LPM.  Work-up in the ED showed leukocytosis, normocytic anemia, hyperkalemia, BUN/creatinine 53/4.38 (baseline creatinine at 1.6-2.3) with eGFR at 13, hypoalbuminemia, lipase 58, troponin x 1 - 22, hyperphosphatemia.  Influenza A, B and SARS coronavirus 2 was negative. Chest x-ray showed mild cardiomegaly and interstitial opacities, likely pulmonary edema. She was treated with hydralazine, diltiazem IV x1, labetalol and clonidine with no improvement in BP (SBP still greater than 200).  Patient was also treated with Phenergan, Compazine due to vomiting.  Hospitalist was asked to admit patient for further evaluation and management.  Review of Systems: Constitutional: Negative for chills and fever.  HENT: Positive for burning sensation in throat from  recurrent vomiting.  Negative for ear pain   Eyes: Negative for pain and visual disturbance.  Respiratory: Negative for cough, chest tightness and shortness of breath.   Cardiovascular: Negative for chest pain and palpitations.  Gastrointestinal: Positive for abdominal pain, nausea and vomiting.  Endocrine: Negative for polyphagia and polyuria.  Genitourinary: Negative for decreased urine volume, dysuria, enuresis Musculoskeletal: Positive for burning sensation in chest from recurrent vomiting.  Negative for arthralgias and back pain.  Skin: Negative for color change and rash.  Allergic/Immunologic: Negative for immunocompromised state.  Neurological: Negative for tremors, syncope, speech difficulty, weakness, light-headedness and headaches.  Hematological: Does not bruise/bleed easily.  All other systems reviewed and are negative   Past Medical History:  Diagnosis Date  . Diabetes mellitus    uncontrolled, last A1c was 14  . E. coli sepsis ([place])   . Ectopic pregnancy   . Essential hypertension, benign 11/01/2008  . Fatty liver   . Gastroparesis   . GERD (gastroesophageal reflux disease)   . Helicobacter pylori gastritis 09/07/2011  . Migraine   . Morbid obesity with body mass index (BMI) of 40.0 to 49.9 ([place])   . Pulmonary edema   . Pyelonephritis   . Ureteral obstruction    Past Surgical History:  Procedure Laterality Date  . AMPUTATION Right 02/04/2019   Procedure: RIGHT BELOW KNEE AMPUTATION;  Surgeon: Newt Minion, MD;  Location: [facility];  Service: Orthopedics;  Laterality: Right;  . ESOPHAGOGASTRODUODENOSCOPY  2010  . WOUND DEBRIDEMENT Right 01/03/2019   Procedure: DEBRIDEMENT WOUND RIGHT LATERAL FOOT;  Surgeon: Angelia Mould, MD;  [facility];  Service: Vascular;  Laterality: Right;    Social History:  reports that she has never smoked. She has never used smokeless tobacco. She reports previous alcohol use. She reports current drug use. Drug:  Cocaine.   Allergies  Allergen Reactions  . Bee Venom Anaphylaxis, Hives, Shortness Of Breath and Swelling  . Penicillins Anaphylaxis and Itching    Has patient had a PCN reaction causing immediate rash, facial/tongue/throat swelling, SOB or lightheadedness with hypotension: yes Has patient had a PCN reaction causing severe rash involving mucus membranes or skin necrosis: unknown Has patient had a PCN reaction that required hospitalization : yes Has patient had a PCN reaction occurring within the last 10 years: no If all of the above answers are "NO", then may proceed with Cephalosporin use.   . Lovenox [Enoxaparin Sodium] Other (See Comments)    Hyperkalemia? GI bleed    Family History  Problem Relation Age of Onset  . Arthritis Mother   . Asthma Mother   . COPD Mother   . Depression Mother   . Diabetes Mother   . Hypertension Mother   . Mental illness Mother   . Kidney disease Mother   . Crohn's disease Mother   . Aneurysm Father 52       brain  . Mental illness Sister   . Arthritis Maternal Aunt   . Asthma Maternal Aunt   . COPD Maternal Aunt   . Depression Maternal Aunt   . Diabetes Maternal Aunt   . Hypertension Maternal Aunt   . Mental illness Maternal Aunt   . Stroke Maternal Aunt   . Heart failure Maternal Aunt   . Heart failure Maternal Grandfather   . Cancer Maternal Grandfather   . Diabetes Maternal Grandfather   . Heart disease Maternal Grandfather   . Hypertension Maternal Grandfather   . Hyperlipidemia Maternal Grandfather   . Mental illness Brother   . Diabetes Maternal Grandmother   . Heart disease Maternal Grandmother   . Hypertension Maternal Grandmother   . Hyperlipidemia Maternal Grandmother   . Diabetes Paternal Grandmother   . Heart disease Paternal Grandmother   . Diabetes Paternal Grandfather   . Cancer Paternal Grandfather      Prior to Admission medications   Medication Sig Start Date End Date Taking? Authorizing Provider   amLODipine (NORVASC) 10 MG tablet Take 10 mg by mouth daily. 10/29/19   [provider]  [employer] VITAMIN C 1000 MG tablet Take 1,000 mg by mouth 2 (two) times daily. 10/29/19   [provider]  [employer] ZINC GLUCONATE 50 MG tablet Take 50 mg by mouth daily. 10/29/19   [provider]  doxycycline (VIBRA-TABS) 100 MG tablet Take 100 mg by mouth 2 (two) times daily. 11/09/19   [provider]  ferrous sulfate 325 (65 FE) MG tablet Take 325 mg by mouth daily. 10/29/19   [provider]  furosemide (LASIX) 20 MG tablet Take 20 mg by mouth 2 (two) times daily. 11/16/19   [provider]  furosemide (LASIX) 40 MG tablet Take 1.5 tablets (60 mg total) by mouth in the morning and at bedtime for 4 days. Until you see your kidney doctor 11/21/19 11/25/19  Mesner, Corene Cornea, MD  gabapentin (NEURONTIN) 400 MG capsule Take 400 mg by mouth 3 (three) times daily. 11/16/19   [provider]  HUMALOG 100 UNIT/ML injection Inject 2-5 Units into the skin 3 (three) times daily. 11/16/19   [provider]  hydrALAZINE (APRESOLINE) 25 MG tablet Take 25 mg by mouth every 8 (eight) hours. 11/16/19  [provider]  ibuprofen (ADVIL) 200 MG tablet Take 400 mg by mouth daily as needed for moderate pain.    [provider]  LANTUS 100 UNIT/ML injection Inject 20 Units into the skin at bedtime. 11/16/19   [provider]  linezolid (ZYVOX) 600 MG tablet Take 600 mg by mouth 2 (two) times daily. 11/16/19   [provider]  promethazine (PHENERGAN) 25 MG tablet Take 1 tablet (25 mg total) by mouth every 6 (six) hours as needed for nausea. 03/05/20   Veryl Speak, MD    Physical Exam: BP (!) 210/115 (BP Location: Left Arm)   Pulse (!) 108   Temp 97.8 F (36.6 C) (Oral)   Resp (!) 24   Ht 5' 2" (1.575 m)   Wt (!) 149.7 kg   SpO2 99%   BMI 60.36 kg/m   . General: 35 y.o. year-old female well developed well nourished in no acute distress.   Alert and oriented x3. Marland Kitchen HEENT: NCAT, EOMI . Neck: Supple, trachea medial . Cardiovascular: Regular rate and rhythm with no rubs or gallops.  No thyromegaly or JVD noted.   Marland Kitchen Respiratory: Clear to auscultation with no wheezes or rales. Good inspiratory effort. . Abdomen: Soft, nontender nondistended with normal bowel sounds x4 quadrants. . Muskuloskeletal: Right BKA.  No cyanosis, clubbing or edema noted bilaterally . Neuro: CN II-XII intact, strength 5/5 x 4, sensation, reflexes intact . Skin: Left leg with healed blister wounds.  No ulcerative lesions noted . Psychiatry: Mood is appropriate for condition and setting          Labs on Admission:  Basic Metabolic Panel: Recent Labs  Lab 09/24/20 0044  NA 139  K 5.6*  CL 108  CO2 19*  GLUCOSE 211*  BUN 53*  CREATININE 4.38*  CALCIUM 8.1*  MG 1.7  PHOS 6.4*   Liver Function Tests: Recent Labs  Lab 09/24/20 0044  AST 15  ALT 11  ALKPHOS 120  BILITOT 0.8  PROT 6.9  ALBUMIN 2.7*   Recent Labs  Lab 09/24/20 0044  LIPASE 58*   No results for input(s): AMMONIA in the last 168 hours. CBC: Recent Labs  Lab 09/24/20 0044  WBC 16.2*  NEUTROABS 14.8*  HGB 9.3*  HCT 29.3*  MCV 89.9  PLT 290   Cardiac Enzymes: No results for input(s): CKTOTAL, CKMB, CKMBINDEX, TROPONINI in the last 168 hours.  BNP (last 3 results) No results for input(s): BNP in the last 8760 hours.  ProBNP (last 3 results) No results for input(s): PROBNP in the last 8760 hours.  CBG: Recent Labs  Lab 09/24/20 0058  GLUCAP 191*    Radiological Exams on Admission: DG Chest Port 1 View  Result Date: 09/24/2020 CLINICAL DATA:  Shortness of breath EXAM: PORTABLE CHEST 1 VIEW COMPARISON:  05/19/2020 FINDINGS: Mild cardiomegaly with interstitial opacities. No pleural effusion or pneumothorax. IMPRESSION: Mild cardiomegaly and interstitial opacities, likely pulmonary edema. Electronically Signed   By: Ulyses Jarred M.D.   On: 09/24/2020 01:17     EKG: I independently viewed the EKG done and my findings are as followed: Sinus tachycardia at a rate of 120 bpm  Assessment/Plan Present on Admission: . Intractable nausea and vomiting . Gastroparesis . Hypertensive urgency . Uncontrolled type 2 diabetes mellitus with hyperglycemia, without long-term current use of insulin ([place]) . GERD (gastroesophageal reflux disease)  Principal Problem:   Intractable nausea and vomiting Active Problems:   Gastroparesis   Pulmonary edema   Hyperkalemia  GERD (gastroesophageal reflux disease)   Hypertensive urgency   Uncontrolled type 2 diabetes mellitus with hyperglycemia, without long-term current use of insulin ([facility])   Leukocytosis   Chronic respiratory failure with hypoxia ([facility])   Hyperphosphatemia   Hypoalbuminemia due to protein-calorie malnutrition ([facility])   Elevated lipase   Elevated troponin   Noncompliance with medication regimen   Acute kidney injury superimposed on CKD ([facility])  Intractable nausea and vomiting possibly secondary to gastroparesis Continue IV Phenergan 12.5 mg every 6 hours as needed Continue IV Reglan 10 mg every 6 hours as needed for nausea/vomiting Continue IV Benadryl 25 mg every 6 hours as needed to prevent anticholinergic effect of Reglan  GERD Continue IV Protonix 40 mg daily  Hypertensive urgency Essential hypertension SBP on arrival to the ED was 224/156, this was drawn around 210/115 despite treatment with  hydralazine, diltiazem IV x1, labetalol and clonidine Patient was started on IV nicardipine drip (She was [facility] to [facility] from 1/4-1/7 due to hypertensive urgency and she responded to a nicardipine drip) Patient will be transitioned to oral meds when BP is better controlled  Possible pulmonary edema Chest x-ray showed mild cardiomegaly and interstitial opacities, likely pulmonary edema.  IV Lasix 40 Mg x1 was given Continue total input/output, daily weights and fluid  restriction Continue Cardiac diet  Echocardiogram will be done in the morning   Hyperkalemia K+ 5.6; ECG showed no significant change when compared to CT of 03/05/2020 This may be related to patient's worsening kidney function Continue to monitor K+ level and treat accordingly  Hyperphosphatemia possibly due to multifactorial Phosphorous 6.4; this may also be related to the patient's worsening kidney function Continue to monitor and treat accordingly  Leukocytosis possibly reactive WBC 16.2, continue to monitor WBC with morning labs  Elevated lipase level Lipase 58, patient denies any abdominal pain Consider CT abdomen and pelvis if lipase continues to stay elevated or patient develops worsening symptoms  Elevated troponin possibly secondary to type II demand ischemia Troponin x122, patient complains of burning sensation in chest and throat from recurrent vomiting, but denies any cardiac related chest pain Continue to trend troponin  History of chronic respiratory failure with hypoxia Patient states that she has been dependent on oxygen since the last time she had community-acquired pneumonia, she denies obstructive sleep apnea Patient may have obesity hypoventilation syndrome considering her body habitus  History of noncompliance with medication regimen Patient was counseled on importance of being compliant with medication   DVT prophylaxis: SCDs (patient states that she develops GI bleed on Lovenox)  Code Status: Full code  Family Communication: None at bedside  Disposition Plan:  Patient is from:                        home Anticipated DC to:                   SNF or family members home Anticipated DC date:               2-3 days Anticipated DC barriers:           Patient is not stable to be discharged at this time due to intractable nausea and vomiting with inability to keep anything down  Consults called: None  Admission status: Inpatient    Bernadette Hoit  MD [facility]  09/24/2020, 6:56 AM

## 2020-09-24 NOTE — Progress Notes (Signed)
  Echocardiogram 2D Echocardiogram with 3D and strain has been performed.  Darlina Sicilian M 09/24/2020, 1:46 PM

## 2020-09-24 NOTE — Progress Notes (Signed)
Patient c/o not being able to use restroom , states it was since 10pm 09/23/20. Bladder scan revealed 926m. In and out by BDustin,LPN and PMardene Celeste RTherapist, sports U/A collected and sent to lab, 1157mof amber/clear urine to drainage bag. Patient tolerated well. Peri-care and bath given to patient as well as linen changed.

## 2020-09-24 NOTE — ED Triage Notes (Addendum)
Complaints of nausea and vomiting since Tuesday and has not been able to hold any of her home medications down. However today she began with increased sob. Hx of pneumonia caused pt to be on chronic home oxygen at 2 lpm via [place]. Ems increased same to 4 lpm.

## 2020-09-25 LAB — GLUCOSE, CAPILLARY
Glucose-Capillary: 120 mg/dL — ABNORMAL HIGH (ref 70–99)
Glucose-Capillary: 91 mg/dL (ref 70–99)
Glucose-Capillary: 142 mg/dL — ABNORMAL HIGH (ref 70–99)
Glucose-Capillary: 89 mg/dL (ref 70–99)

## 2020-09-25 LAB — CBC
HCT: 21.3 % — ABNORMAL LOW (ref 36.0–46.0)
Hemoglobin: 6.7 g/dL — CL (ref 12.0–15.0)
MCH: 29.1 pg (ref 26.0–34.0)
MCHC: 31.5 g/dL (ref 30.0–36.0)
MCV: 92.6 fL (ref 80.0–100.0)
Platelets: 196 10*3/uL (ref 150–400)
RBC: 2.3 MIL/uL — ABNORMAL LOW (ref 3.87–5.11)
RDW: 17.3 % — ABNORMAL HIGH (ref 11.5–15.5)
WBC: 11.1 10*3/uL — ABNORMAL HIGH (ref 4.0–10.5)
nRBC: 0 % (ref 0.0–0.2)

## 2020-09-25 LAB — COMPREHENSIVE METABOLIC PANEL
ALT: 9 U/L (ref 0–44)
AST: 11 U/L — ABNORMAL LOW (ref 15–41)
Albumin: 2.2 g/dL — ABNORMAL LOW (ref 3.5–5.0)
Alkaline Phosphatase: 81 U/L (ref 38–126)
Anion gap: 8 (ref 5–15)
BUN: 54 mg/dL — ABNORMAL HIGH (ref 6–20)
CO2: 21 mmol/L — ABNORMAL LOW (ref 22–32)
Calcium: 7.1 mg/dL — ABNORMAL LOW (ref 8.9–10.3)
Chloride: 107 mmol/L (ref 98–111)
Creatinine, Ser: 4.51 mg/dL — ABNORMAL HIGH (ref 0.44–1.00)
GFR, Estimated: 13 mL/min — ABNORMAL LOW (ref >60)
Glucose, Bld: 121 mg/dL — ABNORMAL HIGH (ref 70–99)
Potassium: 4.8 mmol/L (ref 3.5–5.1)
Sodium: 136 mmol/L (ref 135–145)
Total Bilirubin: 0.5 mg/dL (ref 0.3–1.2)
Total Protein: 5.2 g/dL — ABNORMAL LOW (ref 6.5–8.1)

## 2020-09-25 LAB — HEMOGLOBIN A1C
Hgb A1c MFr Bld: 5.9 % — ABNORMAL HIGH (ref 4.8–5.6)
Mean Plasma Glucose: 122.63 mg/dL

## 2020-09-25 LAB — IRON AND TIBC
Iron: 24 ug/dL — ABNORMAL LOW (ref 28–170)
Saturation Ratios: 13 % (ref 10.4–31.8)
TIBC: 181 ug/dL — ABNORMAL LOW (ref 250–450)
UIBC: 157 ug/dL

## 2020-09-25 LAB — PHOSPHORUS: Phosphorus: 6.2 mg/dL — ABNORMAL HIGH (ref 2.5–4.6)

## 2020-09-25 LAB — PREPARE RBC (CROSSMATCH)

## 2020-09-25 LAB — PROTIME-INR
INR: 1.2 (ref 0.8–1.2)
Prothrombin Time: 14.8 seconds (ref 11.4–15.2)

## 2020-09-25 LAB — MAGNESIUM: Magnesium: 1.7 mg/dL (ref 1.7–2.4)

## 2020-09-25 LAB — FERRITIN: Ferritin: 163 ng/mL (ref 11–307)

## 2020-09-25 LAB — APTT: aPTT: 24 seconds (ref 24–36)

## 2020-09-25 MED ORDER — SODIUM CHLORIDE 0.9% FLUSH: 10.0000 mL | INTRAVENOUS | Status: DC | PRN

## 2020-09-25 MED ORDER — CHLORHEXIDINE GLUCONATE CLOTH 2 % EX PADS
6.0000 | MEDICATED_PAD | Freq: Every day | CUTANEOUS | Status: DC
  Administered 2020-09-25 – 2020-09-27 (×3): 6 via TOPICAL

## 2020-09-25 MED ORDER — INSULIN GLARGINE 100 UNIT/ML ~~LOC~~ SOLN
16.0000 [IU] | Freq: Every day | SUBCUTANEOUS | Status: DC
  Administered 2020-09-25 – 2020-09-26 (×2): 16 [IU] via SUBCUTANEOUS
  Filled 2020-09-25 (×3): qty 0.16

## 2020-09-25 MED ORDER — PANTOPRAZOLE SODIUM 40 MG PO TBEC
40.0000 mg | DELAYED_RELEASE_TABLET | Freq: Every day | ORAL | Status: DC
  Administered 2020-09-25 – 2020-09-27 (×3): 40 mg via ORAL
  Filled 2020-09-25 (×3): qty 1

## 2020-09-25 MED ORDER — SODIUM CHLORIDE 0.9% FLUSH
10.0000 mL | Freq: Two times a day (BID) | INTRAVENOUS | Status: DC
Start: 2020-09-25 — End: 2020-09-28
  Administered 2020-09-25 – 2020-09-26 (×2): 10 mL
  Administered 2020-09-26: 20 mL
  Administered 2020-09-27: 10 mL

## 2020-09-25 MED ORDER — FUROSEMIDE 10 MG/ML IJ SOLN
40.0000 mg | Freq: Once | INTRAMUSCULAR | Status: AC
  Administered 2020-09-25: 40 mg via INTRAVENOUS
  Filled 2020-09-25: qty 4

## 2020-09-25 MED ORDER — SODIUM CHLORIDE 0.9% IV SOLUTION: Freq: Once | INTRAVENOUS | Status: DC

## 2020-09-25 NOTE — Progress Notes (Signed)
Has had no nausea or vomiting since admission.  Started on heart healthy, carb mod at lunch and has tolerated that.  Received 2 units PRBC with no adverse reaction.  O2 sats on room air 99-100.  Stated was in [place] in Jan for PNA and was sent home with O2 and has not been reevaluated.  Asked for smaller WC due to one at home too large and she has to crawl in some areas of her home.  [place] setting up for one from [employer].

## 2020-09-25 NOTE — Progress Notes (Signed)
Patient Demographics:    Sheila Austin, is a 35 y.o. female, DOB - 1985/11/12, [id number]  Admit date - 09/24/2020   Admitting Physician Beatrice Ziehm Denton Brick, MD  Outpatient Primary MD for the patient is [facility]  LOS - 1  Chief Complaint  Patient presents with  . Emesis  . Nausea        Subjective:    [employer] today has no fevers,  No chest pain,    -No further emesis, voiding better -Denies menorrhagia, LMP more than 2 weeks ago -No bleeding concerns but hemoglobin is down to 6.7 from a baseline usually between 9 and 10  ,  Assessment  & Plan :    Principal Problem:   Intractable nausea and vomiting Active Problems:   Acute urinary retention   Gastroparesis   Pulmonary edema   Hyperkalemia   GERD (gastroesophageal reflux disease)   Hypertensive urgency   Uncontrolled type 2 diabetes mellitus with hyperglycemia, without long-term current use of insulin (HCC)   Leukocytosis   Chronic respiratory failure with hypoxia (HCC)   Hyperphosphatemia   Hypoalbuminemia due to protein-calorie malnutrition (HCC)   Elevated lipase   Elevated troponin   Noncompliance with medication regimen   Acute renal failure superimposed on chronic kidney disease (HCC)   Hypertensive crisis  Brief Summary:- 35 y.o. female with medical history significant for T2DM, hypertension, CKD stage IV, chronic respiratory failure with supplemental oxygen 3 LPM.  History of noncompliance with medication admitted on 09/24/2020 with intractable emesis, hypertensive urgency , AKI and urinary retention  A/p 1)intractable Emesis--- suspect gastroparesis and the patient who has been diabetic for about 20 years now  -rectal and IV antiemetics as ordered -Lipase is 58 most likely due to intractable emesis, doubt significant acute pancreatitis -No further emesis, tolerating liquid diet - 2)Hypertensive  Crisis--- initially in the ED BP was 224/156, repeat BP after interventions in the ED was around 210/115  BP improved on IV Cardene drip -Patient weaned off Cardene drip Continue clonidine patch 0.1 weekly, Amlodipine 10 mg daily Coreg 12.5 mg twice daily, hydralazine 100 mg 3 times daily and isosorbide 30 mg daily -IV labetalol as needed elevated BP  3)DM1--continue Lantus insulin to 12 units due to intractable emesis and poor oral intake -Will increase Lantus as oral intake improves  Use Novolog/Humalog Sliding scale insulin with Accu-Cheks/Fingersticks as ordered   4)AKI----acute kidney injury on CKD stage -3B with hyperkalemia -This is due to intractable emesis with dehydration    creatinine on admission= 4.38  , baseline creatinine = 1.8 to 2.2 - creatinine is now=4.51 (new peak)  --renally adjust medications, avoid nephrotoxic agents / dehydration  / hypotension -Hyperkalemia resolved after Lokelma  5)Acute Urinary retention -Having difficulty voiding, bladder scan with about a liter of urine -In and out catheterization on 09/24/2020 -Flomax as ordered -Patient voiding better  6) patient with chronic hypoxic respiratory failure--- at baseline uses oxygen at 2 to 3 L via nasal cannula----clinically doubt significant CHF, echo with preserved EF of 60 to 65% without wall motion abnormalities -  7) iron deficiency anemia- -serum iron is 24, TIBC is 181  -Ferritin is not low -denies menorrhagia, LMP more than 2 weeks ago, -No bleeding concerns but hemoglobin is down  to 6.7 from a baseline usually between 9 and 10 -Transfused 2 units of PRBC -Consider GI consult for endoluminal evaluation  Disposition/Need for in-Hospital Stay- patient unable to be discharged at this time due to --intractable emesis with worsening renal function and requiring IV fluids and IV antiemetics -Worsening anemia requiring transfusion  Status is: Inpatient  Remains inpatient appropriate because:Please see  disposition above   Disposition: The patient is from: Home              Anticipated d/c is to: Home              Anticipated d/c date is: 2 days              Patient currently is not medically stable to d/c. Barriers: Not Clinically Stable-  Code Status :  -  Code Status: Full Code   Family Communication:    NA (patient is alert, awake and coherent)   Consults  :   --- DVT Prophylaxis  :   - SCD/heparin, right BKA, SCDs Start: 09/24/20 0717    Lab Results  Component Value Date   PLT 196 09/25/2020    Inpatient Medications  Scheduled Meds: . sodium chloride   Intravenous Once  . amLODipine  10 mg Oral Daily  . carvedilol  12.5 mg Oral BID WC  . Chlorhexidine Gluconate Cloth  6 each Topical Daily  . cloNIDine  0.1 mg Transdermal Weekly  . hydrALAZINE  100 mg Oral TID  . insulin aspart  0-15 Units Subcutaneous TID WC  . insulin aspart  0-5 Units Subcutaneous QHS  . insulin aspart  3 Units Subcutaneous TID WC  . insulin glargine  12 Units Subcutaneous QHS  . isosorbide mononitrate  30 mg Oral Daily  . pantoprazole  40 mg Oral Daily  . prochlorperazine  25 mg Rectal Once  . sodium chloride flush  10-40 mL Intracatheter Q12H  . tamsulosin  0.4 mg Oral QPC supper   Continuous Infusions: . sodium chloride 150 mL/hr at 09/25/20 0616  . promethazine (PHENERGAN) injection (IM or IVPB) Stopped (09/24/20 0544)   PRN Meds:.diphenhydrAMINE, labetalol, metoCLOPramide (REGLAN) injection, promethazine (PHENERGAN) injection (IM or IVPB), sodium chloride flush    Anti-infectives (From admission, onward)   None        Objective:   Vitals:   09/25/20 1455 09/25/20 1521 09/25/20 1529 09/25/20 1710  BP: 111/61 128/79 128/79 128/78  Pulse: 80 80 80 81  Resp: '16 16 16 16  '$ Temp: 97.8 F (36.6 C) 98.9 F (37.2 C) 98.9 F (37.2 C) 98.2 F (36.8 C)  TempSrc: Oral Oral Oral Oral  SpO2: 100% 100% 100% 98%  Weight:      Height:        Wt Readings from Last 3 Encounters:   09/25/20 (!) 149.5 kg  03/05/20 (!) 137 kg  11/20/19 (!) 137 kg     Intake/Output Summary (Last 24 hours) at 09/25/2020 1738 Last data filed at 09/25/2020 1710 Gross per 24 hour  Intake 1204.9 ml  Output 100 ml  Net 1104.9 ml   Physical Exam  Gen:- Awake Alert, morbidly obese, HEENT:- [place].AT, No sclera icterus Nose- [place] 2 L/min Neck-Supple Neck,No JVD,.  Lungs-diminished in bases, no wheezing CV- S1, S2 normal, regular  Abd-  +ve B.Sounds, Abd Soft, No tenderness, increased truncal adiposity Extremity/Skin:-Right BKA, left lower extremity with pedal pulses Psych-affect is appropriate, oriented x3 Neuro-generalized weakness, no new focal deficits, no tremors   Data Review:   Micro  Results Recent Results (from the past 240 hour(s))  Resp Panel by RT-PCR (Flu A&B, Covid) Nasopharyngeal Swab     Status: None   Collection Time: 09/24/20 12:47 AM   Specimen: Nasopharyngeal Swab; Nasopharyngeal(NP) swabs in vial transport medium  Result Value Ref Range Status   SARS Coronavirus 2 by RT PCR NEGATIVE NEGATIVE Final    Comment: (NOTE) SARS-CoV-2 target nucleic acids are NOT DETECTED.  The SARS-CoV-2 RNA is generally detectable in upper respiratory specimens during the acute phase of infection. The lowest concentration of SARS-CoV-2 viral copies this assay can detect is 138 copies/mL. A negative result does not preclude SARS-Cov-2 infection and should not be used as the sole basis for treatment or other patient management decisions. A negative result may occur with  improper specimen collection/handling, submission of specimen other than nasopharyngeal swab, presence of viral mutation(s) within the areas targeted by this assay, and inadequate number of viral copies(<138 copies/mL). A negative result must be combined with clinical observations, patient history, and epidemiological information. The expected result is Negative.  Fact Sheet for Patients:   EntrepreneurPulse.com.au  Fact Sheet for Healthcare Providers:  IncredibleEmployment.be  This test is no t yet approved or cleared by the [place] FDA and  has been authorized for detection and/or diagnosis of SARS-CoV-2 by FDA under an Emergency Use Authorization (EUA). This EUA will remain  in effect (meaning this test can be used) for the duration of the COVID-19 declaration under Section 564(b)(1) of the Act, 21 U.S.C.section 360bbb-3(b)(1), unless the authorization is terminated  or revoked sooner.       Influenza A by PCR NEGATIVE NEGATIVE Final   Influenza B by PCR NEGATIVE NEGATIVE Final    Comment: (NOTE) The Xpert Xpress SARS-CoV-2/FLU/RSV plus assay is intended as an aid in the diagnosis of influenza from Nasopharyngeal swab specimens and should not be used as a sole basis for treatment. Nasal washings and aspirates are unacceptable for Xpert Xpress SARS-CoV-2/FLU/RSV testing.  Fact Sheet for Patients: EntrepreneurPulse.com.au  Fact Sheet for Healthcare Providers: IncredibleEmployment.be  This test is not yet approved or cleared by the [place] FDA and has been authorized for detection and/or diagnosis of SARS-CoV-2 by FDA under an Emergency Use Authorization (EUA). This EUA will remain in effect (meaning this test can be used) for the duration of the COVID-19 declaration under Section 564(b)(1) of the Act, 21 U.S.C. section 360bbb-3(b)(1), unless the authorization is terminated or revoked.  Performed at [facility], [address] [place], [place] [address]   MRSA PCR Screening     Status: Abnormal   Collection Time: 09/24/20  1:19 PM   Specimen: Urine, Catheterized; Nasopharyngeal  Result Value Ref Range Status   MRSA by PCR POSITIVE (A) NEGATIVE Final    Comment: The GeneXpert MRSA Assay (FDA approved for NASAL specimens only), is one component of a comprehensive MRSA  colonization surveillance program. It is not intended to diagnose MRSA infection nor to guide or monitor treatment for MRSA infections. CRITICAL RESULT CALLED TO, READ BACK BY AND VERIFIED WITH: Randa Lynn 09/24/2020 COLEMAN,R Performed at [facility], [address], [place], [place] [address]     Radiology Reports DG Chest [place] 1 View  Result Date: 09/24/2020 CLINICAL DATA:  Shortness of breath EXAM: PORTABLE CHEST 1 VIEW COMPARISON:  05/19/2020 FINDINGS: Mild cardiomegaly with interstitial opacities. No pleural effusion or pneumothorax. IMPRESSION: Mild cardiomegaly and interstitial opacities, likely pulmonary edema. Electronically Signed   By: Ulyses Jarred M.D.   On: 09/24/2020 01:17  ECHOCARDIOGRAM COMPLETE  Result Date: 09/24/2020    ECHOCARDIOGRAM REPORT   Patient Name:   Sheila Austin Date of Exam: 09/24/2020 Medical Rec #:  [id number]        Height:       62.0 in Accession #:    [id number]       Weight:       330.0 lb Date of Birth:  03-Sep-1985        BSA:          2.365 m Patient Age:    28 years         BP:           139/78 mmHg Patient Gender: F                HR:           96 bpm. Exam Location:  Inpatient Procedure: 2D Echo, 3D Echo, Cardiac Doppler, Color Doppler and Strain Analysis Indications:    CHF-Acute Systolic [id number]  History:        Patient has prior history of Echocardiogram examinations, most                 recent 10/05/2011. Signs/Symptoms:Shortness of Breath; Risk                 Factors:Hypertension and Diabetes. Chronic kidney disease.                 Vomiting. Cardiomegaly, Pulmonary edema. GERD Chronic                 respiratory failure with hypoxia.  Sonographer:    Darlina Sicilian RDCS Referring Phys: [id number] OLADAPO ADEFESO  Sonographer Comments: Image acquisition challenging due to respiratory motion. IMPRESSIONS  1. Left ventricular ejection fraction, by estimation, is 60 to 65%. The left ventricle has normal function. The left ventricle has no regional  wall motion abnormalities. There is mild left ventricular hypertrophy. Left ventricular diastolic function could not be evaluated.  2. Right ventricular systolic function is hyperdynamic. The right ventricular size is normal.  3. The pericardial effusion is circumferential. There is no evidence of cardiac tamponade.  4. The mitral valve is abnormal. Trivial mitral valve regurgitation.  5. The aortic valve is tricuspid. Aortic valve regurgitation is not visualized.  6. The inferior vena cava is dilated in size with <50% respiratory variability, suggesting right atrial pressure of 15 mmHg. FINDINGS  Left Ventricle: Left ventricular ejection fraction, by estimation, is 60 to 65%. The left ventricle has normal function. The left ventricle has no regional wall motion abnormalities. The left ventricular internal cavity size was normal in size. There is  mild left ventricular hypertrophy. Left ventricular diastolic function could not be evaluated due to atrial fibrillation. Left ventricular diastolic function could not be evaluated. Right Ventricle: The right ventricular size is normal. No increase in right ventricular wall thickness. Right ventricular systolic function is hyperdynamic. Left Atrium: Left atrial size was normal in size. Right Atrium: Right atrial size was normal in size. Pericardium: Trivial pericardial effusion is present. The pericardial effusion is circumferential. There is no evidence of cardiac tamponade. Mitral Valve: The mitral valve is abnormal. There is mild thickening of the mitral valve leaflet(s). Trivial mitral valve regurgitation. Tricuspid Valve: The tricuspid valve is grossly normal. Tricuspid valve regurgitation is trivial. Aortic Valve: The aortic valve is tricuspid. Aortic valve regurgitation is not visualized. Pulmonic Valve: The pulmonic valve was normal in structure. Pulmonic valve regurgitation is not visualized. Aorta: The  aortic root and ascending aorta are structurally normal, with  no evidence of dilitation. Venous: The inferior vena cava is dilated in size with less than 50% respiratory variability, suggesting right atrial pressure of 15 mmHg. IAS/Shunts: No atrial level shunt detected by color flow Doppler.  LEFT VENTRICLE PLAX 2D LVIDd:         4.67 cm  Diastology LVIDs:         2.94 cm  LV e' medial:    6.31 cm/s LV PW:         1.21 cm  LV E/e' medial:  23.3 LV IVS:        1.27 cm  LV e' lateral:   5.66 cm/s LVOT diam:     1.95 cm  LV E/e' lateral: 26.0 LV SV:         65 LV SV Index:   27 LVOT Area:     2.99 cm  RIGHT VENTRICLE RV S prime:     16.60 cm/s TAPSE (M-mode): 2.0 cm LEFT ATRIUM             Index       RIGHT ATRIUM           Index LA diam:        4.40 cm 1.86 cm/m  RA Area:     15.05 cm LA Vol (A2C):   29.7 ml 12.56 ml/m RA Volume:   35.10 ml  14.84 ml/m LA Vol (A4C):   52.0 ml 22.01 ml/m LA Biplane Vol: 44.9 ml 18.98 ml/m  AORTIC VALVE LVOT Vmax:   128.00 cm/s LVOT Vmean:  99.300 cm/s LVOT VTI:    0.216 m  AORTA Ao Root diam: 2.70 cm Ao Asc diam:  3.10 cm MITRAL VALVE MV Area (PHT): 4.93 cm     SHUNTS MV Decel Time: 154 msec     Systemic VTI:  0.22 m MV E velocity: 147.00 cm/s  Systemic Diam: 1.95 cm MV A velocity: 99.40 cm/s MV E/A ratio:  1.48 Lyman Bishop MD Electronically signed by Lyman Bishop MD Signature Date/Time: 09/24/2020/5:26:55 PM    Final      CBC Recent Labs  Lab 09/24/20 0044 09/25/20 0733  WBC 16.2* 11.1*  HGB 9.3* 6.7*  HCT 29.3* 21.3*  PLT 290 196  MCV 89.9 92.6  MCH 28.5 29.1  MCHC 31.7 31.5  RDW 17.1* 17.3*  LYMPHSABS 0.9  --   MONOABS 0.4  --   EOSABS 0.0  --   BASOSABS 0.0  --     Chemistries  Recent Labs  Lab 09/24/20 0044 09/24/20 0815 09/25/20 0621  NA 139 137 136  K 5.6* 5.6* 4.8  CL 108 108 107  CO2 19* 19* 21*  GLUCOSE 211* 220* 121*  BUN 53* 55* 54*  CREATININE 4.38* 4.42* 4.51*  CALCIUM 8.1* 7.8* 7.1*  MG 1.7  --  1.7  AST 15  --  11*  ALT 11  --  9  ALKPHOS 120  --  81  BILITOT 0.8  --  0.5    ------------------------------------------------------------------------------------------------------------------ No results for input(s): CHOL, HDL, LDLCALC, TRIG, CHOLHDL, LDLDIRECT in the last 72 hours.  Lab Results  Component Value Date   HGBA1C 5.9 (H) 09/24/2020   ------------------------------------------------------------------------------------------------------------------ No results for input(s): TSH, T4TOTAL, T3FREE, THYROIDAB in the last 72 hours.  Invalid input(s): FREET3 ------------------------------------------------------------------------------------------------------------------ Recent Labs    09/25/20 0733  FERRITIN 163  TIBC 181*  IRON 24*    Coagulation profile Recent Labs  Lab  09/25/20 0621  INR 1.2    No results for input(s): DDIMER in the last 72 hours.  Cardiac Enzymes No results for input(s): CKMB, TROPONINI, MYOGLOBIN in the last 168 hours.  Invalid input(s): CK ------------------------------------------------------------------------------------------------------------------ No results found for: BNP   Roxan Hockey M.D on 09/25/2020 at 5:38 PM  Go to [email or web address] - for contact info  [facility] - Office  [phone]

## 2020-09-25 NOTE — TOC Initial Note (Signed)
Transition of Care [facility]) - Initial/Assessment Note    Patient Details  Name: Sheila Austin MRN: [id number] Date of Birth: 19-Jan-1986  Transition of [facility]) CM/SW Contact:    Natasha Bence, LCSW Phone Number: 09/25/2020, 5:01 PM  Clinical Narrative:                 Patient is a 35 year old female admitted for intractable nausea and vomiting. Patient requested smaller wheelchair due to her current one not fitting within entry spaces in her home. CSW placed referral for wheelchair with Zack from [facility]. Zack agreeable to provide wheelchair to patient. TOC to follow.        Patient Goals and CMS Choice        Expected Discharge Plan and Services                                                Prior Living Arrangements/Services                       Activities of Daily Living Home Assistive Devices/Equipment: Wheelchair,Oxygen ADL Screening (condition at time of admission) Patient's cognitive ability adequate to safely complete daily activities?: Yes Is the patient deaf or have difficulty hearing?: No Does the patient have difficulty seeing, even when wearing glasses/contacts?: Yes Does the patient have difficulty concentrating, remembering, or making decisions?: No Patient able to express need for assistance with ADLs?: Yes Does the patient have difficulty dressing or bathing?: Yes Independently performs ADLs?: No Communication: Independent Dressing (OT): Needs assistance Is this a change from baseline?: Pre-admission baseline Grooming: Needs assistance Is this a change from baseline?: Pre-admission baseline Feeding: Independent Bathing: Needs assistance Is this a change from baseline?: Pre-admission baseline Toileting: Independent In/Out Bed: Needs assistance Is this a change from baseline?: Pre-admission baseline Walks in Home: Needs assistance Is this a change from baseline?: Pre-admission baseline Does the patient have difficulty walking  or climbing stairs?: Yes Weakness of Legs: Right Weakness of Arms/Hands: None  Permission Sought/Granted                  Emotional Assessment              Admission diagnosis:  Hypertensive crisis [I16.9] Hypertensive urgency [I16.0] Normochromic normocytic anemia [D64.9] Intractable nausea and vomiting [R11.2] Non-intractable vomiting with nausea, unspecified vomiting type [R11.2] Acute renal failure superimposed on chronic kidney disease, unspecified CKD stage, unspecified acute renal failure type ([place]) [N17.9, N18.9] Patient Active Problem List   Diagnosis Date Noted  . Leukocytosis 09/24/2020  . Chronic respiratory failure with hypoxia ([place]) 09/24/2020  . Hyperphosphatemia 09/24/2020  . Hypoalbuminemia due to protein-calorie malnutrition ([place]) 09/24/2020  . Elevated lipase 09/24/2020  . Elevated troponin 09/24/2020  . Noncompliance with medication regimen 09/24/2020  . Acute renal failure superimposed on chronic kidney disease ([place]) 09/24/2020  . Hypertensive crisis 09/24/2020  . Acute urinary retention 09/24/2020  . Nausea & vomiting 10/10/2019  . Hypokalemia 10/10/2019  . CKD (chronic kidney disease), stage III ([place]) 10/10/2019  . CKD (chronic kidney disease), stage III ([place]) 10/10/2019  . Cellulitis of right lower extremity 08/23/2019  . Uncontrolled type 2 diabetes mellitus with hyperglycemia, without long-term current use of insulin ([place]) 08/23/2019  . Intractable nausea and vomiting 08/23/2019  . GERD with esophagitis 08/23/2019  . Lactic acidosis 08/23/2019  . Hyperosmolar  hyperglycemic state (HHS) ([place]) 05/16/2019  . AKI (acute kidney injury) ([place]) 05/16/2019  . S/P AKA (above knee amputation), right ([place]) 05/16/2019  . GERD (gastroesophageal reflux disease)   . Hypertensive urgency   . Benign essential HTN   . Unilateral complete BKA, right, sequela ([place])   . Hyperkalemia   . Unilateral complete BKA, right, initial encounter ([place]) 02/09/2019  .  Acquired absence of right leg below knee ([place])   . Hyponatremia   . Acute blood loss anemia   . Sinus tachycardia   . Post-operative pain   . Bacteremia   . 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 Streptococcal bacteremia   . Klebsiella pneumoniae infection   . Abscess of leg, right   . Skin ulcer of right foot with necrosis of muscle ([place])   . Severe protein-calorie malnutrition ([place])   . DM (diabetes mellitus) ([place]) 02/01/2019  . Adjustment disorder 02/01/2019  . Sepsis due to cellulitis ([place]) 02/01/2019  . Cellulitis in diabetic foot ([place]) 12/31/2018  . Obesity 12/31/2018  . Facial cellulitis 07/02/2018  . Class 3 severe obesity with body mass index (BMI) of 40.0 to 44.9 in adult ([place]) 07/09/2017  . Abscess of right breast 02/24/2016  . BMI 45.0-49.9, adult ([place]) 09/29/2015  . Skin lesions - right lower extremity 12/07/2013  . Morbid obesity with BMI of 50.0-59.9, adult ([place]) 10/05/2011  . Pulmonary edema 10/05/2011  . Obstructive sleep apnea 10/05/2011  . HLD (hyperlipidemia) 09/27/2011  . Migraine 09/07/2011  . Essential hypertension, benign 11/01/2008  . Gastroparesis 09/27/2008  . IRREGULAR MENSES 08/24/2008  . Diabetes mellitus out of control ([place]) 07/04/2006   PCP:  Practice, [place]:   [facility] [place] - [address] [place] ([place]  [address] Phone: [phone] Fax: [phone]  [id number] [place] [id number]: [phone]: [phone]    Social Determinants of Health (SDOH) Interventions    Readmission Risk Interventions No flowsheet data found.

## 2020-09-25 NOTE — Op Note (Signed)
Patient:  Sheila Austin  DOB:  08/09/1985  MRN:  [id number]   Preop Diagnosis: Acute renal failure, anemia, need for central venous access  Postop Diagnosis: Same  Procedure: Central line placement  Surgeon: Aviva Signs, MD  Anes: Local  Indications: Patient is a 35 year old white female who presents with both anemia and acute kidney injury.  No peripheral access could be obtained, thus a consult was placed for central line placement.  The risks and benefits of the procedure including bleeding and infection were fully explained to the patient, who gave informed consent.  Procedure note: The procedure was done at bedside with nursing present.  Surgical site confirmation was performed.  Right groin was prepped and draped using the usual sterile technique with ChloraPrep.  1% Xylocaine was used for local anesthesia.  The right femoral vein was accessed using the Seldinger technique without difficulty.  A guidewire was advanced without difficulty.  A triple-lumen catheter was placed and the guidewire removed.  Good backflow of venous blood was noted on aspiration of all 3 ports.  All 3 ports were flushed with saline.  The catheter was secured in place using 3-0 silk sutures.  A dry sterile dressing was applied per protocol.  The patient tolerated the procedure well.  Complications: None  EBL: Minimal  Specimen: None

## 2020-09-26 ENCOUNTER — Telehealth: Payer: Self-pay | Admitting: Gastroenterology

## 2020-09-26 ENCOUNTER — Encounter (HOSPITAL_COMMUNITY): Payer: Self-pay | Admitting: Family Medicine

## 2020-09-26 DIAGNOSIS — R112 Nausea with vomiting, unspecified: Secondary | ICD-10-CM

## 2020-09-26 LAB — BASIC METABOLIC PANEL
Anion gap: 8 (ref 5–15)
BUN: 55 mg/dL — ABNORMAL HIGH (ref 6–20)
CO2: 21 mmol/L — ABNORMAL LOW (ref 22–32)
Calcium: 6.9 mg/dL — ABNORMAL LOW (ref 8.9–10.3)
Chloride: 106 mmol/L (ref 98–111)
Creatinine, Ser: 4.6 mg/dL — ABNORMAL HIGH (ref 0.44–1.00)
GFR, Estimated: 12 mL/min — ABNORMAL LOW (ref >60)
Glucose, Bld: 100 mg/dL — ABNORMAL HIGH (ref 70–99)
Potassium: 4.4 mmol/L (ref 3.5–5.1)
Sodium: 135 mmol/L (ref 135–145)

## 2020-09-26 LAB — URINE CULTURE
Culture: NO GROWTH
Special Requests: NORMAL

## 2020-09-26 LAB — BPAM RBC
Blood Product Expiration Date: 202206222359
Blood Product Expiration Date: 202206232359
ISSUE DATE / TIME: 202205221202
ISSUE DATE / TIME: 202205221501
Unit Type and Rh: 6200
Unit Type and Rh: 6200

## 2020-09-26 LAB — TYPE AND SCREEN
ABO/RH(D): A POS
Antibody Screen: NEGATIVE
Unit division: 0
Unit division: 0

## 2020-09-26 LAB — GLUCOSE, CAPILLARY
Glucose-Capillary: 118 mg/dL — ABNORMAL HIGH (ref 70–99)
Glucose-Capillary: 103 mg/dL — ABNORMAL HIGH (ref 70–99)
Glucose-Capillary: 87 mg/dL (ref 70–99)
Glucose-Capillary: 141 mg/dL — ABNORMAL HIGH (ref 70–99)

## 2020-09-26 LAB — CBC
HCT: 26 % — ABNORMAL LOW (ref 36.0–46.0)
Hemoglobin: 8.3 g/dL — ABNORMAL LOW (ref 12.0–15.0)
MCH: 28.9 pg (ref 26.0–34.0)
MCHC: 31.9 g/dL (ref 30.0–36.0)
MCV: 90.6 fL (ref 80.0–100.0)
Platelets: 196 10*3/uL (ref 150–400)
RBC: 2.87 MIL/uL — ABNORMAL LOW (ref 3.87–5.11)
RDW: 16.6 % — ABNORMAL HIGH (ref 11.5–15.5)
WBC: 9.4 10*3/uL (ref 4.0–10.5)
nRBC: 0 % (ref 0.0–0.2)

## 2020-09-26 LAB — FOLATE: Folate: 7.4 ng/mL (ref >5.9)

## 2020-09-26 LAB — VITAMIN B12: Vitamin B-12: 409 pg/mL (ref 180–914)

## 2020-09-26 MED ORDER — DARBEPOETIN ALFA 100 MCG/0.5ML IJ SOSY
100.0000 ug | PREFILLED_SYRINGE | Freq: Once | INTRAMUSCULAR | Status: AC
  Administered 2020-09-26: 100 ug via SUBCUTANEOUS
  Filled 2020-09-26: qty 0.5

## 2020-09-26 MED ORDER — MUPIROCIN 2 % EX OINT
1.0000 "application " | TOPICAL_OINTMENT | Freq: Two times a day (BID) | CUTANEOUS | Status: DC
  Administered 2020-09-26 – 2020-09-27 (×3): 1 via NASAL
  Filled 2020-09-26: qty 22

## 2020-09-26 MED ORDER — SODIUM CHLORIDE 0.9 % IV SOLN: INTRAVENOUS | Status: DC

## 2020-09-26 NOTE — Consult Note (Addendum)
Referring Provider: Dr. Roxan Hockey  Primary Care Physician:  [facility] Gastroenterologist:  Dr. Carlean Purl, LBGI  Date of Admission: 09/24/20 Date of Consultation: 09/26/20  Reason for Consultation:  IDA  HPI:  Sheila Austin is a 35 y.o. year old female with multiple medical issues to include Type 1 Diabetes, gastroparesis (GES with delayed emptying in 2010), HTN, CKD, admitted with intractable N/V. Hypertensive crisis at time of presentation and now improved. Chronic normocytic anemia with drop in Hgb from 9.3 to 6.7 yesterday, receiving 2 units PRBCs and improvement to 8.3 this morning. No hemoccult on file as no stool in rectal vault per nursing staff. GI now consulted to evaluate for endoscopic procedures.   Colonoscopy in 2015 for diarrhea was overall normal and normal colonic biopsies. Last EGD in 2010 normal. Has flares of N/V lasting about a week, a few times a year. This episode started acutely about 3 days ago with intractable N/V. Historically, will take Zofran as needed during spells. However, this was not helpful this time. No melena or hematochezia. Most of emesis was brown like muddy chocolate milk or muddy water. Feels like food sits on chest at times. No pill dysphagia or liquid dysphagia. In Feb 2022 noted some left-sided LUQ soreness. States her spleen is enlarged. Noted on CT imaging in the past. [place], recent CT Feb 2022 with normal size spleen. LUQ achy in Feb 2022 but no longer pain. No NSAIDs. No reflux symptoms normally. Has heavy periods. Lasts about a week of heavy bleeding. Rarely has light days.    Has a BM a few times a week, which is her baseline.  Has wheelchair at home. Crawls mainly through house, as wheelchair does not fit through doors. Desires to perform procedures as outpatient. Clinically, she has improved with resolution of N/V. Tolerating diet.   Outside creatinine in Feb 2022 was 3.45.   Past Medical History:   Diagnosis Date  . Diabetes mellitus    uncontrolled, last A1c was 14  . E. coli sepsis ([place])   . Ectopic pregnancy   . Essential hypertension, benign 11/01/2008  . Fatty liver   . Gastroparesis   . GERD (gastroesophageal reflux disease)   . Helicobacter pylori gastritis 09/07/2011  . Migraine   . Morbid obesity with body mass index (BMI) of 40.0 to 49.9 ([place])   . Pulmonary edema   . Pyelonephritis   . Ureteral obstruction     Past Surgical History:  Procedure Laterality Date  . AMPUTATION Right 02/04/2019   Procedure: RIGHT BELOW KNEE AMPUTATION;  Surgeon: Newt Minion, MD;  Location: [facility];  Service: Orthopedics;  Laterality: Right;  . COLONOSCOPY  2015   TI normal, colon appeared normal. Multiple biopsies benign.   . ESOPHAGOGASTRODUODENOSCOPY  2010   Normal  . WOUND DEBRIDEMENT Right 01/03/2019   Procedure: DEBRIDEMENT WOUND RIGHT LATERAL FOOT;  Surgeon: Angelia Mould, MD;  Location: [facility] OR;  Service: Vascular;  Laterality: Right;    Prior to Admission medications   Medication Sig Start Date End Date Taking? Authorizing Provider  amLODipine (NORVASC) 5 MG tablet Take 1 tablet by mouth daily. 05/24/20  Yes [provider]  carvedilol (COREG) 12.5 MG tablet Take 12.5 mg by mouth 2 (two) times daily. 06/30/20  Yes [provider]  [employer] VITAMIN C 1000 MG tablet Take 1,000 mg by mouth 2 (two) times daily. 10/29/19  Yes [provider]  [employer]S ZINC GLUCONATE 50 MG tablet Take 50 mg  by mouth daily. 10/29/19  Yes [provider]  ferrous sulfate 325 (65 FE) MG tablet Take 325 mg by mouth daily. 10/29/19  Yes [provider]  hydrALAZINE (APRESOLINE) 100 MG tablet Take 100 mg by mouth 3 (three) times daily. 06/30/20  Yes [provider]  ibuprofen (ADVIL) 200 MG tablet Take 400 mg by mouth daily as needed for moderate pain.   Yes [provider]  LANTUS 100 UNIT/ML injection Inject 20 Units into the skin at bedtime. 11/16/19   Yes [provider]  metolazone (ZAROXOLYN) 2.5 MG tablet Take 2.5 mg by mouth daily. 07/11/20  Yes [provider]  PROAIR HFA 108 (90 Base) MCG/ACT inhaler Inhale 2 puffs into the lungs every 6 (six) hours as needed for wheezing or shortness of breath. 05/20/20  Yes [provider]  Vitamin D, Ergocalciferol, (DRISDOL) 1.25 MG (50000 UNIT) CAPS capsule Take 1 capsule by mouth once a week. 07/11/20  Yes [provider]  promethazine (PHENERGAN) 25 MG tablet Take 1 tablet (25 mg total) by mouth every 6 (six) hours as needed for nausea. 03/05/20   Veryl Speak, MD    Current Facility-Administered Medications  Medication Dose Route Frequency Provider Last Rate Last Admin  . 0.9 %  sodium chloride infusion (Manually program via Guardrails IV Fluids)   Intravenous Once Emokpae, Courage, MD      . 0.9 %  sodium chloride infusion   Intravenous Continuous Denton Brick, Courage, MD 10 mL/hr at 09/25/20 1825 Rate Change at 09/25/20 1825  . 0.9 %  sodium chloride infusion   Intravenous Continuous Roxan Hockey, MD 75 mL/hr at 09/26/20 1438 New Bag at 09/26/20 1438  . amLODipine (NORVASC) tablet 10 mg  10 mg Oral Daily Denton Brick, Courage, MD   10 mg at 09/26/20 0907  . carvedilol (COREG) tablet 12.5 mg  12.5 mg Oral BID WC Emokpae, Courage, MD   12.5 mg at 09/26/20 0907  . Chlorhexidine Gluconate Cloth 2 % PADS 6 each  6 each Topical Daily Aviva Signs, MD   6 each at 09/26/20 1440  . cloNIDine (CATAPRES - Dosed in mg/24 hr) patch 0.1 mg  0.1 mg Transdermal Weekly Emokpae, Courage, MD   0.1 mg at 09/24/20 1146  . diphenhydrAMINE (BENADRYL) capsule 25 mg  25 mg Oral Q6H PRN Adefeso, Oladapo, DO      . hydrALAZINE (APRESOLINE) tablet 100 mg  100 mg Oral TID Roxan Hockey, MD   100 mg at 09/26/20 0907  . insulin aspart (novoLOG) injection 0-15 Units  0-15 Units Subcutaneous TID WC Roxan Hockey, MD   2 Units at 09/25/20 1705  . insulin aspart (novoLOG) injection 0-5 Units  0-5  Units Subcutaneous QHS Emokpae, Courage, MD      . insulin aspart (novoLOG) injection 3 Units  3 Units Subcutaneous TID WC Roxan Hockey, MD   3 Units at 09/26/20 1305  . insulin glargine (LANTUS) injection 16 Units  16 Units Subcutaneous QHS Roxan Hockey, MD   16 Units at 09/25/20 2149  . isosorbide mononitrate (IMDUR) 24 hr tablet 30 mg  30 mg Oral Daily Emokpae, Courage, MD   30 mg at 09/26/20 1305  . labetalol (NORMODYNE) injection 10 mg  10 mg Intravenous Q4H PRN Emokpae, Courage, MD      . metoCLOPramide (REGLAN) injection 10 mg  10 mg Intravenous Q6H PRN Adefeso, Oladapo, DO   10 mg at 09/24/20 0852  . mupirocin ointment (BACTROBAN) 2 % 1 application  1 application Nasal BID  Roxan Hockey, MD   1 application at [id number]  . pantoprazole (PROTONIX) EC tablet 40 mg  40 mg Oral Daily Denton Brick, Courage, MD   40 mg at 09/26/20 0907  . prochlorperazine (COMPAZINE) suppository 25 mg  25 mg Rectal Once Emokpae, Courage, MD      . promethazine (PHENERGAN) 12.5 mg in sodium chloride 0.9 % 50 mL IVPB  12.5 mg Intravenous Q6H PRN Adefeso, Oladapo, DO   Stopped at 09/24/20 0544  . sodium chloride flush (NS) 0.9 % injection 10-40 mL  10-40 mL Intracatheter Q12H Aviva Signs, MD   10 mL at 09/26/20 0920  . sodium chloride flush (NS) 0.9 % injection 10-40 mL  10-40 mL Intracatheter PRN Aviva Signs, MD      . tamsulosin [facility]) capsule 0.4 mg  0.4 mg Oral QPC supper Roxan Hockey, MD   0.4 mg at 09/25/20 1704    Allergies as of 09/24/2020 - Review Complete 09/24/2020  Allergen Reaction Noted  . Bee venom Anaphylaxis, Hives, Shortness Of Breath, and Swelling 01/28/2016  . Penicillins Anaphylaxis and Itching 10/01/2011  . Lovenox [enoxaparin sodium] Other (See Comments) 02/20/2019    Family History  Problem Relation Age of Onset  . Arthritis Mother   . Asthma Mother   . COPD Mother   . Depression Mother   . Diabetes Mother   . Hypertension Mother   . Mental illness Mother   .  Kidney disease Mother   . Crohn's disease Mother   . Colon polyps Mother        in her 73s. Unknown if adenomas    . Aneurysm Father 48       brain  . Mental illness Sister   . Crohn's disease Sister   . Arthritis Maternal Aunt   . Asthma Maternal Aunt   . COPD Maternal Aunt   . Depression Maternal Aunt   . Diabetes Maternal Aunt   . Hypertension Maternal Aunt   . Mental illness Maternal Aunt   . Stroke Maternal Aunt   . Heart failure Maternal Aunt   . Heart failure Maternal Grandfather   . Cancer Maternal Grandfather   . Diabetes Maternal Grandfather   . Heart disease Maternal Grandfather   . Hypertension Maternal Grandfather   . Hyperlipidemia Maternal Grandfather   . Mental illness Brother   . Diabetes Maternal Grandmother   . Heart disease Maternal Grandmother   . Hypertension Maternal Grandmother   . Hyperlipidemia Maternal Grandmother   . Diabetes Paternal Grandmother   . Heart disease Paternal Grandmother   . Diabetes Paternal Grandfather   . Cancer Paternal Grandfather   . Colon cancer Neg Hx     Social History   Socioeconomic History  . Marital status: Divorced    Spouse name: Roderic Palau  . Number of children: 0  . Years of education: 90  . Highest education level: Not on file  Occupational History  . Occupation: Biomedical scientist: [employer]  Tobacco Use  . Smoking status: Never Smoker  . Smokeless tobacco: Never Used  Vaping Use  . Vaping Use: Never used  Substance and Sexual Activity  . Alcohol use: Not Currently  . Drug use: Never  . Sexual activity: Not Currently    Partners: Male    Birth control/protection: Condom    Comment: Pt is interested in taking BCP  Other Topics Concern  . Not on file  Social History Narrative   The patient is single, separated from her  husband 01/2015.   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 [employer]   patient is left handed    Lives with her mother   Social Determinants of Adult nurse Strain: Not on file  Food Insecurity: Not on file  Transportation Needs: Not on file  Physical Activity: Not on file  Stress: Not on file  Social Connections: Not on file  Intimate Partner Violence: Not on file    Review of Systems: Gen: Denies fever, chills, loss of appetite, change in weight or weight loss CV: Denies chest pain, heart palpitations, syncope, edema  Resp: Denies shortness of breath with rest, cough, wheezing GI: see HPI GU : Denies urinary burning, urinary frequency, urinary incontinence.  MS: see HPI  Derm: Denies rash, itching, dry skin Psych: Denies depression, anxiety,confusion, or memory loss Heme: Denies bruising, bleeding, and enlarged lymph nodes.  Physical Exam: Vital signs in last 24 hours: Temp:  [97.8 F (36.6 C)-98.9 F (37.2 C)] 98.5 F (36.9 C) (05/23 1446) Pulse Rate:  [76-81] 78 (05/23 1446) Resp:  [16-19] 19 (05/23 1446) BP: (111-145)/(61-84) 140/84 (05/23 1446) SpO2:  [97 %-100 %] 99 % (05/23 1446) Weight:  [150 kg] 150 kg (05/23 0600) Last BM Date: 09/24/20 General:   Alert,  Well-developed, well-nourished, pleasant and cooperative in NAD Head:  Normocephalic and atraumatic. Eyes:  Sclera clear, no icterus.   Conjunctiva pink. Ears:  Normal auditory acuity. Nose:  No deformity, discharge,  or lesions. Mouth:  No deformity or lesions, dentition normal. Lungs:  Clear throughout to auscultation.   Heart:  S1 S2 present without murmurs Abdomen:  Soft, obese, large AP diameter, nontender and nondistended. No masses, hepatosplenomegaly or hernias noted. Normal bowel sounds, without guarding, and without rebound.   Rectal:  Deferred until time of colonoscopy.   Msk:  Right BKA, left lower extremity with calloused knees Neurologic:  Alert and  oriented x4 Psych:  Alert and cooperative. Normal mood and affect.  Intake/Output from previous day: 05/22 0701 - 05/23 0700 In: 614 [Blood:614] Out: -  Intake/Output this  shift: Total I/O In: 360 [P.O.:360] Out: 650 [Urine:650]  Lab Results: Recent Labs    09/24/20 0044 09/25/20 0733 09/26/20 0454  WBC 16.2* 11.1* 9.4  HGB 9.3* 6.7* 8.3*  HCT 29.3* 21.3* 26.0*  PLT 290 196 196   BMET Recent Labs    09/24/20 0815 09/25/20 0621 09/26/20 0454  NA 137 136 135  K 5.6* 4.8 4.4  CL 108 107 106  CO2 19* 21* 21*  GLUCOSE 220* 121* 100*  BUN 55* 54* 55*  CREATININE 4.42* 4.51* 4.60*  CALCIUM 7.8* 7.1* 6.9*   LFT Recent Labs    09/24/20 0044 09/24/20 0815 09/25/20 0621  PROT 6.9  --  5.2*  ALBUMIN 2.7* 2.5* 2.2*  AST 15  --  11*  ALT 11  --  9  ALKPHOS 120  --  81  BILITOT 0.8  --  0.5   PT/INR Recent Labs    09/25/20 0621  LABPROT 14.8  INR 1.2   Lab Results  Component Value Date   IRON 24 (L) 09/25/2020   TIBC 181 (L) 09/25/2020   FERRITIN 163 09/25/2020    Impression:  35 y.o. year old female with multiple medical issues to include Type 1 Diabetes, gastroparesis (GES with delayed emptying in 2010), HTN, CKD, admitted with intractable N/V, hypertensive crisis, acute on chronic kidney disease, acute on chronic anemia during hospitalization requiring 2  units PRBCs with improvement in Hgb from 6.7 to 8.3. Hemoccult status unknown and without overt GI bleeding.   Clinically, she has improved with resolution of N/V, tolerating diet. She notes vague sensation of food "sitting in chest" at times. Vomiting episodes occurring several times a year and at times will require ED evaluation. Likely due to history of gastroparesis in setting of diabetes. Last EGD in remote past (2010). Colonoscopy 2015 normal.   Anemia appears multifactorial in setting of chronic disease; ferritin is normal but iron low at 24. She does endorse heavy menstrual cycles lasting around 7 days.   Recommend colonoscopy/EGD+/-dilation but patient would like to pursue this as outpatient. She feels she will be able to complete the prep without difficulty despite her  ambulation difficulties (using wheelchair and crawling mainly).    Plan: Continue supportive measures Will arrange TCS/EGD+/- dilation as outpatient Will follow peripherally unless patient decides to pursue as inpatient. Patient desires to transfer care here locally.    Annitta Needs, PhD, ANP-BC [facility]    LOS: 2 days    09/26/2020, 2:48 PM

## 2020-09-26 NOTE — Progress Notes (Signed)
Has voided three times today

## 2020-09-26 NOTE — Telephone Encounter (Signed)
Will await d/c to schedule procedure.

## 2020-09-26 NOTE — Telephone Encounter (Signed)
[facility] clinical pool:  Patient seen while inpatient for anemia. Please arrange colonoscopy/EGD/dilation with Dr. Abbey Chatters. ASA 3 (BMI).

## 2020-09-26 NOTE — TOC Progression Note (Signed)
Transition of Care [facility]) - Progression Note    Patient Details  Name: Sheila Austin MRN: [id number] Date of Birth: February 18, 1986  [facility] CM/SW Contact  [name], [place] Phone Number: 09/26/2020, 9:24 AM  Clinical Narrative:  LCSW followed up with [employer] regarding wheelchair. Freda Munro with [employer] reports pt cannot get wheelchair through insurance as she received one in 2020. Pt was not aware she had Medicaid then, but per Freda Munro it is showing as covered by Medicaid. Pt given private pay rate of $85 per month which she refuses. LCSW discussed [employer] which donates DME to patients in need. Pt will contact. No other needs reported.            Expected Discharge Plan and Services                                                 Social Determinants of Health (SDOH) Interventions    Readmission Risk Interventions No flowsheet data found.

## 2020-09-26 NOTE — Progress Notes (Addendum)
Patient Demographics:    Sheila Austin, is a 35 y.o. female, DOB - 1986-01-20, [id number]  Admit date - 09/24/2020   Admitting Physician Amariya Liskey Denton Brick, MD  Outpatient Primary MD for the patient is [facility], [facility]  LOS - 2  Chief Complaint  Patient presents with  . Emesis  . Nausea        Subjective:    Sheila Austin today has no fevers,  No chest pain,    -Continues to void well post void bladder scan less than 100 mL -Oral intake tolerance is better, no further emesis --Creatinine continues to trend up each day  ,  Assessment  & Plan :    Principal Problem:   Intractable nausea and vomiting Active Problems:   Acute urinary retention   Gastroparesis   Pulmonary edema   Hyperkalemia   GERD (gastroesophageal reflux disease)   Hypertensive urgency   Uncontrolled type 2 diabetes mellitus with hyperglycemia, without long-term current use of insulin (HCC)   Leukocytosis   Chronic respiratory failure with hypoxia (HCC)   Hyperphosphatemia   Hypoalbuminemia due to protein-calorie malnutrition (HCC)   Elevated lipase   Elevated troponin   Noncompliance with medication regimen   Acute renal failure superimposed on chronic kidney disease (HCC)   Hypertensive crisis  Brief Summary:- 35 y.o. female with medical history significant for T2DM, hypertension, CKD stage IV, chronic respiratory failure with supplemental oxygen 3 LPM.  History of noncompliance with medication admitted on 09/24/2020 with intractable emesis, hypertensive urgency , AKI and urinary retention  A/p 1)intractable Emesis--- suspect gastroparesis and the patient who has been diabetic for about 20 years now  -rectal and IV antiemetics as ordered -Lipase is 58 most likely due to intractable emesis, doubt significant acute pancreatitis -No further emesis, -Will advance diet to solids - 2)Hypertensive Crisis---  initially in the ED BP was 224/156, repeat BP after interventions in the ED was around 210/115  BP improved on IV Cardene drip -Patient weaned off Cardene drip Continue clonidine patch 0.1 weekly, Amlodipine 10 mg daily Coreg 12.5 mg twice daily, hydralazine 100 mg 3 times daily and isosorbide 30 mg daily -IV labetalol as needed elevated BP  3)DM1--continue Lantus insulin to 12 units due to intractable emesis and poor oral intake -Will increase Lantus as oral intake improves  Use Novolog/Humalog Sliding scale insulin with Accu-Cheks/Fingersticks as ordered   4)AKI----acute kidney injury on CKD stage -3B with hyperkalemia -This is due to intractable emesis with dehydration    creatinine on admission= 4.38  , baseline creatinine = 2.7 to 3.4 per [facility] - creatinine is now=4.60 (new peak)  --renally adjust medications, avoid nephrotoxic agents / dehydration  / hypotension -Hyperkalemia resolved after Lokelma -Creatinine continues to trend up each day  5)Acute Urinary retention -Having difficulty voiding, bladder scan with about a liter of urine -In and out catheterization on 09/24/2020 -Flomax as ordered -Patient voiding better, postvoid residual less than 100 mm  6) patient with chronic hypoxic respiratory failure--- at baseline uses oxygen at 2 to 3 L via nasal cannula----clinically doubt significant CHF, echo with preserved EF of 60 to 65% without wall motion abnormalities - 7) iron deficiency anemia- -serum iron is 24, TIBC is 181  -Ferritin is not low  B12 and folate are not low -denies menorrhagia, LMP more than 2 weeks ago,  -No active bleeding concerns -  baseline usually between 9 and 10 -Hemoglobin is up to 8.3 from 6.7 after transfusion of 2 units of PRBC -Consider GI consult for endoluminal evaluation-appreciated -Colonoscopy in 2015 for diarrhea was overall normal and normal colonic biopsies. Last EGD in 2010 normal. -Patient apparently would like to have  endoluminal evaluation as outpatient  8)Morbid Obesity- -Low calorie diet, portion control and increase physical activity discussed with patient -Body mass index is 60.48 kg/m.  Disposition/Need for in-Hospital Stay- patient unable to be discharged at this time due to --intractable emesis with worsening renal function and requiring IV fluids and IV antiemetics -Worsening anemia requiring transfusion  Status is: Inpatient  Remains inpatient appropriate because:Please see disposition above   Disposition: The patient is from: Home              Anticipated d/c is to: Home              Anticipated d/c date is: 2 days              Patient currently is not medically stable to d/c. Barriers: Not Clinically Stable-  Code Status :  -  Code Status: Full Code   Family Communication:    NA (patient is alert, awake and coherent)   Consults  :   --- DVT Prophylaxis  :   - SCD/heparin, right BKA, SCDs Start: 09/24/20 0717   Lab Results  Component Value Date   PLT 196 09/26/2020    Inpatient Medications  Scheduled Meds: . sodium chloride   Intravenous Once  . amLODipine  10 mg Oral Daily  . carvedilol  12.5 mg Oral BID WC  . Chlorhexidine Gluconate Cloth  6 each Topical Daily  . cloNIDine  0.1 mg Transdermal Weekly  . hydrALAZINE  100 mg Oral TID  . insulin aspart  0-15 Units Subcutaneous TID WC  . insulin aspart  0-5 Units Subcutaneous QHS  . insulin aspart  3 Units Subcutaneous TID WC  . insulin glargine  16 Units Subcutaneous QHS  . isosorbide mononitrate  30 mg Oral Daily  . mupirocin ointment  1 application Nasal BID  . pantoprazole  40 mg Oral Daily  . prochlorperazine  25 mg Rectal Once  . sodium chloride flush  10-40 mL Intracatheter Q12H  . tamsulosin  0.4 mg Oral QPC supper   Continuous Infusions: . sodium chloride 10 mL/hr at 09/25/20 1825  . sodium chloride 75 mL/hr at 09/26/20 1438  . promethazine (PHENERGAN) injection (IM or IVPB) Stopped (09/24/20 0544)   PRN  Meds:.diphenhydrAMINE, labetalol, metoCLOPramide (REGLAN) injection, promethazine (PHENERGAN) injection (IM or IVPB), sodium chloride flush    Anti-infectives (From admission, onward)   None        Objective:   Vitals:   09/25/20 2100 09/26/20 0525 09/26/20 0600 09/26/20 1446  BP: 139/76 (!) 145/78  140/84  Pulse: 79 76  78  Resp: '18 18  19  '$ Temp: 98 F (36.7 C) 98.2 F (36.8 C)  98.5 F (36.9 C)  TempSrc: Oral Oral  Oral  SpO2: 99% 97%  99%  Weight:   (!) 150 kg   Height:        Wt Readings from Last 3 Encounters:  09/26/20 (!) 150 kg  03/05/20 (!) 137 kg  11/20/19 (!) 137 kg     Intake/Output Summary (Last 24 hours) at 09/26/2020 1855 Last data filed at  09/26/2020 1500 Gross per 24 hour  Intake 360 ml  Output 1150 ml  Net -790 ml   Physical Exam  Gen:- Awake Alert, morbidly obese, HEENT:- [place].AT, No sclera icterus, Rt Eye Blindness Nose- [place] 2 L/min Neck-Supple Neck,No JVD,.  Lungs-diminished in bases, no wheezing CV- S1, S2 normal, regular  Abd-  +ve B.Sounds, Abd Soft, No tenderness, increased truncal adiposity Extremity/Skin:-Right BKA, left lower extremity with pedal pulses Psych-affect is appropriate, oriented x3 Neuro-generalized weakness, no new focal deficits, no tremors   Data Review:   Micro Results Recent Results (from the past 240 hour(s))  Resp Panel by RT-PCR (Flu A&B, Covid) Nasopharyngeal Swab     Status: None   Collection Time: 09/24/20 12:47 AM   Specimen: Nasopharyngeal Swab; Nasopharyngeal(NP) swabs in vial transport medium  Result Value Ref Range Status   SARS Coronavirus 2 by RT PCR NEGATIVE NEGATIVE Final    Comment: (NOTE) SARS-CoV-2 target nucleic acids are NOT DETECTED.  The SARS-CoV-2 RNA is generally detectable in upper respiratory specimens during the acute phase of infection. The lowest concentration of SARS-CoV-2 viral copies this assay can detect is 138 copies/mL. A negative result does not preclude  SARS-Cov-2 infection and should not be used as the sole basis for treatment or other patient management decisions. A negative result may occur with  improper specimen collection/handling, submission of specimen other than nasopharyngeal swab, presence of viral mutation(s) within the areas targeted by this assay, and inadequate number of viral copies(<138 copies/mL). A negative result must be combined with clinical observations, patient history, and epidemiological information. The expected result is Negative.  Fact Sheet for Patients:  EntrepreneurPulse.com.au  Fact Sheet for Healthcare Providers:  IncredibleEmployment.be  This test is no t yet approved or cleared by the [place] FDA and  has been authorized for detection and/or diagnosis of SARS-CoV-2 by FDA under an Emergency Use Authorization (EUA). This EUA will remain  in effect (meaning this test can be used) for the duration of the COVID-19 declaration under Section 564(b)(1) of the Act, 21 U.S.C.section 360bbb-3(b)(1), unless the authorization is terminated  or revoked sooner.       Influenza A by PCR NEGATIVE NEGATIVE Final   Influenza B by PCR NEGATIVE NEGATIVE Final    Comment: (NOTE) The Xpert Xpress SARS-CoV-2/FLU/RSV plus assay is intended as an aid in the diagnosis of influenza from Nasopharyngeal swab specimens and should not be used as a sole basis for treatment. Nasal washings and aspirates are unacceptable for Xpert Xpress SARS-CoV-2/FLU/RSV testing.  Fact Sheet for Patients: EntrepreneurPulse.com.au  Fact Sheet for Healthcare Providers: IncredibleEmployment.be  This test is not yet approved or cleared by the [place] FDA and has been authorized for detection and/or diagnosis of SARS-CoV-2 by FDA under an Emergency Use Authorization (EUA). This EUA will remain in effect (meaning this test can be used) for the duration of  the COVID-19 declaration under Section 564(b)(1) of the Act, 21 U.S.C. section 360bbb-3(b)(1), unless the authorization is terminated or revoked.  Performed at [facility], [address], [place], [place] [address]   MRSA PCR Screening     Status: Abnormal   Collection Time: 09/24/20  1:19 PM   Specimen: Urine, Catheterized; Nasopharyngeal  Result Value Ref Range Status   MRSA by PCR POSITIVE (A) NEGATIVE Final    Comment: The GeneXpert MRSA Assay (FDA approved for NASAL specimens only), is one component of a comprehensive MRSA colonization surveillance program. It is not intended to diagnose MRSA infection nor to guide  or monitor treatment for MRSA infections. CRITICAL RESULT CALLED TO, READ BACK BY AND VERIFIED WITH: Randa Lynn 09/24/2020 COLEMAN,R Performed at [facility], [address], [place], [place] [address]   Urine Culture     Status: None   Collection Time: 09/24/20  5:18 PM   Specimen: Urine, Catheterized  Result Value Ref Range Status   Specimen Description   Final    URINE, CATHETERIZED Performed at [facility], [address] [place], [place] [address]    Special Requests   Final    Normal Performed at [facility], [address] [place], [place] [address]    Culture   Final    NO GROWTH Performed at [facility], [place] [address], [place], [place] [address]    Report Status 09/26/2020 FINAL  Final    Radiology Reports DG Chest Port 1 View  Result Date: 09/24/2020 CLINICAL DATA:  Shortness of breath EXAM: PORTABLE CHEST 1 VIEW COMPARISON:  05/19/2020 FINDINGS: Mild cardiomegaly with interstitial opacities. No pleural effusion or pneumothorax. IMPRESSION: Mild cardiomegaly and interstitial opacities, likely pulmonary edema. Electronically Signed   By: Ulyses Jarred M.D.   On: 09/24/2020 01:17   ECHOCARDIOGRAM COMPLETE  Result Date: 09/24/2020    ECHOCARDIOGRAM REPORT   Patient Name:   Sheila Austin Date of Exam: 09/24/2020 Medical Rec #:   [id number]        Height:       62.0 in Accession #:    [id number]       Weight:       330.0 lb Date of Birth:  03/18/1986        BSA:          2.365 m Patient Age:    8 years         BP:           139/78 mmHg Patient Gender: F                HR:           96 bpm. Exam Location:  Inpatient Procedure: 2D Echo, 3D Echo, Cardiac Doppler, Color Doppler and Strain Analysis Indications:    CHF-Acute Systolic [id number]  History:        Patient has prior history of Echocardiogram examinations, most                 recent 10/05/2011. Signs/Symptoms:Shortness of Breath; Risk                 Factors:Hypertension and Diabetes. Chronic kidney disease.                 Vomiting. Cardiomegaly, Pulmonary edema. GERD Chronic                 respiratory failure with hypoxia.  Sonographer:    Darlina Sicilian RDCS Referring Phys: [id number] OLADAPO ADEFESO  Sonographer Comments: Image acquisition challenging due to respiratory motion. IMPRESSIONS  1. Left ventricular ejection fraction, by estimation, is 60 to 65%. The left ventricle has normal function. The left ventricle has no regional wall motion abnormalities. There is mild left ventricular hypertrophy. Left ventricular diastolic function could not be evaluated.  2. Right ventricular systolic function is hyperdynamic. The right ventricular size is normal.  3. The pericardial effusion is circumferential. There is no evidence of cardiac tamponade.  4. The mitral valve is abnormal. Trivial mitral valve regurgitation.  5. The aortic valve is tricuspid. Aortic valve regurgitation is not visualized.  6. The  inferior vena cava is dilated in size with <50% respiratory variability, suggesting right atrial pressure of 15 mmHg. FINDINGS  Left Ventricle: Left ventricular ejection fraction, by estimation, is 60 to 65%. The left ventricle has normal function. The left ventricle has no regional wall motion abnormalities. The left ventricular internal cavity size was normal in size. There is  mild left  ventricular hypertrophy. Left ventricular diastolic function could not be evaluated due to atrial fibrillation. Left ventricular diastolic function could not be evaluated. Right Ventricle: The right ventricular size is normal. No increase in right ventricular wall thickness. Right ventricular systolic function is hyperdynamic. Left Atrium: Left atrial size was normal in size. Right Atrium: Right atrial size was normal in size. Pericardium: Trivial pericardial effusion is present. The pericardial effusion is circumferential. There is no evidence of cardiac tamponade. Mitral Valve: The mitral valve is abnormal. There is mild thickening of the mitral valve leaflet(s). Trivial mitral valve regurgitation. Tricuspid Valve: The tricuspid valve is grossly normal. Tricuspid valve regurgitation is trivial. Aortic Valve: The aortic valve is tricuspid. Aortic valve regurgitation is not visualized. Pulmonic Valve: The pulmonic valve was normal in structure. Pulmonic valve regurgitation is not visualized. Aorta: The aortic root and ascending aorta are structurally normal, with no evidence of dilitation. Venous: The inferior vena cava is dilated in size with less than 50% respiratory variability, suggesting right atrial pressure of 15 mmHg. IAS/Shunts: No atrial level shunt detected by color flow Doppler.  LEFT VENTRICLE PLAX 2D LVIDd:         4.67 cm  Diastology LVIDs:         2.94 cm  LV e' medial:    6.31 cm/s LV PW:         1.21 cm  LV E/e' medial:  23.3 LV IVS:        1.27 cm  LV e' lateral:   5.66 cm/s LVOT diam:     1.95 cm  LV E/e' lateral: 26.0 LV SV:         65 LV SV Index:   27 LVOT Area:     2.99 cm  RIGHT VENTRICLE RV S prime:     16.60 cm/s TAPSE (M-mode): 2.0 cm LEFT ATRIUM             Index       RIGHT ATRIUM           Index LA diam:        4.40 cm 1.86 cm/m  RA Area:     15.05 cm LA Vol (A2C):   29.7 ml 12.56 ml/m RA Volume:   35.10 ml  14.84 ml/m LA Vol (A4C):   52.0 ml 22.01 ml/m LA Biplane Vol: 44.9 ml  18.98 ml/m  AORTIC VALVE LVOT Vmax:   128.00 cm/s LVOT Vmean:  99.300 cm/s LVOT VTI:    0.216 m  AORTA Ao Root diam: 2.70 cm Ao Asc diam:  3.10 cm MITRAL VALVE MV Area (PHT): 4.93 cm     SHUNTS MV Decel Time: 154 msec     Systemic VTI:  0.22 m MV E velocity: 147.00 cm/s  Systemic Diam: 1.95 cm MV A velocity: 99.40 cm/s MV E/A ratio:  1.48 Lyman Bishop MD Electronically signed by Lyman Bishop MD Signature Date/Time: 09/24/2020/5:26:55 PM    Final      CBC Recent Labs  Lab 09/24/20 0044 09/25/20 0733 09/26/20 0454  WBC 16.2* 11.1* 9.4  HGB 9.3* 6.7* 8.3*  HCT 29.3* 21.3* 26.0*  PLT  290 196 196  MCV 89.9 92.6 90.6  MCH 28.5 29.1 28.9  MCHC 31.7 31.5 31.9  RDW 17.1* 17.3* 16.6*  LYMPHSABS 0.9  --   --   MONOABS 0.4  --   --   EOSABS 0.0  --   --   BASOSABS 0.0  --   --     Chemistries  Recent Labs  Lab 09/24/20 0044 09/24/20 0815 09/25/20 0621 09/26/20 0454  NA 139 137 136 135  K 5.6* 5.6* 4.8 4.4  CL 108 108 107 106  CO2 19* 19* 21* 21*  GLUCOSE 211* 220* 121* 100*  BUN 53* 55* 54* 55*  CREATININE 4.38* 4.42* 4.51* 4.60*  CALCIUM 8.1* 7.8* 7.1* 6.9*  MG 1.7  --  1.7  --   AST 15  --  11*  --   ALT 11  --  9  --   ALKPHOS 120  --  81  --   BILITOT 0.8  --  0.5  --    ------------------------------------------------------------------------------------------------------------------ No results for input(s): CHOL, HDL, LDLCALC, TRIG, CHOLHDL, LDLDIRECT in the last 72 hours.  Lab Results  Component Value Date   HGBA1C 5.9 (H) 09/24/2020   ------------------------------------------------------------------------------------------------------------------ No results for input(s): TSH, T4TOTAL, T3FREE, THYROIDAB in the last 72 hours.  Invalid input(s): FREET3 ------------------------------------------------------------------------------------------------------------------ Recent Labs    09/25/20 0733 09/26/20 0454  VITAMINB12  --  409  FOLATE  --  7.4  FERRITIN 163   --   TIBC 181*  --   IRON 24*  --     Coagulation profile Recent Labs  Lab 09/25/20 0621  INR 1.2    No results for input(s): DDIMER in the last 72 hours.  Cardiac Enzymes No results for input(s): CKMB, TROPONINI, MYOGLOBIN in the last 168 hours.  Invalid input(s): CK ------------------------------------------------------------------------------------------------------------------ No results found for: BNP   Roxan Hockey M.D on 09/26/2020 at 6:55 PM  Go to [email or web address] - for contact info  [facility] - Office  [phone]

## 2020-09-27 LAB — GLUCOSE, CAPILLARY
Glucose-Capillary: 97 mg/dL (ref 70–99)
Glucose-Capillary: 99 mg/dL (ref 70–99)
Glucose-Capillary: 143 mg/dL — ABNORMAL HIGH (ref 70–99)

## 2020-09-27 LAB — OCCULT BLOOD X 1 CARD TO LAB, STOOL: Fecal Occult Bld: NEGATIVE

## 2020-09-27 LAB — CBC
HCT: 26.4 % — ABNORMAL LOW (ref 36.0–46.0)
Hemoglobin: 8.3 g/dL — ABNORMAL LOW (ref 12.0–15.0)
MCH: 28.7 pg (ref 26.0–34.0)
MCHC: 31.4 g/dL (ref 30.0–36.0)
MCV: 91.3 fL (ref 80.0–100.0)
Platelets: 198 10*3/uL (ref 150–400)
RBC: 2.89 MIL/uL — ABNORMAL LOW (ref 3.87–5.11)
RDW: 16.3 % — ABNORMAL HIGH (ref 11.5–15.5)
WBC: 8.4 10*3/uL (ref 4.0–10.5)
nRBC: 0 % (ref 0.0–0.2)

## 2020-09-27 LAB — RENAL FUNCTION PANEL
Albumin: 2 g/dL — ABNORMAL LOW (ref 3.5–5.0)
Anion gap: 7 (ref 5–15)
BUN: 53 mg/dL — ABNORMAL HIGH (ref 6–20)
CO2: 19 mmol/L — ABNORMAL LOW (ref 22–32)
Calcium: 6.6 mg/dL — ABNORMAL LOW (ref 8.9–10.3)
Chloride: 107 mmol/L (ref 98–111)
Creatinine, Ser: 4.76 mg/dL — ABNORMAL HIGH (ref 0.44–1.00)
GFR, Estimated: 12 mL/min — ABNORMAL LOW (ref >60)
Glucose, Bld: 113 mg/dL — ABNORMAL HIGH (ref 70–99)
Phosphorus: 5.6 mg/dL — ABNORMAL HIGH (ref 2.5–4.6)
Potassium: 4.1 mmol/L (ref 3.5–5.1)
Sodium: 133 mmol/L — ABNORMAL LOW (ref 135–145)

## 2020-09-27 MED ORDER — AMLODIPINE BESYLATE 10 MG PO TABS: 10.0000 mg | ORAL_TABLET | Freq: Every day | ORAL | 11 refills | Status: DC

## 2020-09-27 MED ORDER — CARVEDILOL 12.5 MG PO TABS
12.5000 mg | ORAL_TABLET | Freq: Two times a day (BID) | ORAL | 3 refills | Status: DC
Start: 2020-09-27 — End: 2020-12-07

## 2020-09-27 MED ORDER — INSULIN ASPART 100 UNIT/ML FLEXPEN: 3.0000 [IU] | PEN_INJECTOR | Freq: Three times a day (TID) | SUBCUTANEOUS | 11 refills | Status: AC

## 2020-09-27 MED ORDER — ISOSORBIDE MONONITRATE ER 30 MG PO TB24: 30.0000 mg | ORAL_TABLET | Freq: Every day | ORAL | 5 refills | Status: AC

## 2020-09-27 MED ORDER — TAMSULOSIN HCL 0.4 MG PO CAPS: 0.4000 mg | ORAL_CAPSULE | Freq: Every day | ORAL | 5 refills | Status: DC

## 2020-09-27 MED ORDER — SODIUM BICARBONATE 650 MG PO TABS: 650.0000 mg | ORAL_TABLET | Freq: Two times a day (BID) | ORAL | 1 refills | Status: DC

## 2020-09-27 MED ORDER — CLONIDINE 0.1 MG/24HR TD PTWK: 0.1000 mg | MEDICATED_PATCH | TRANSDERMAL | 12 refills | Status: AC

## 2020-09-27 MED ORDER — FERROUS SULFATE 325 (65 FE) MG PO TABS
325.0000 mg | ORAL_TABLET | Freq: Every day | ORAL | 3 refills | Status: DC
Start: 2020-09-27 — End: 2020-10-24

## 2020-09-27 MED ORDER — PROMETHAZINE HCL 25 MG PO TABS: 25.0000 mg | ORAL_TABLET | Freq: Four times a day (QID) | ORAL | 0 refills | Status: DC | PRN

## 2020-09-27 MED ORDER — HYDRALAZINE HCL 100 MG PO TABS: 100.0000 mg | ORAL_TABLET | Freq: Three times a day (TID) | ORAL | 5 refills | Status: DC

## 2020-09-27 MED ORDER — MELATONIN 3 MG PO TABS
6.0000 mg | ORAL_TABLET | Freq: Every day | ORAL | Status: DC
  Administered 2020-09-27: 6 mg via ORAL
  Filled 2020-09-27: qty 2

## 2020-09-27 MED ORDER — LANTUS 100 UNIT/ML ~~LOC~~ SOLN: 18.0000 [IU] | Freq: Every day | SUBCUTANEOUS | 11 refills | Status: DC

## 2020-09-27 NOTE — Discharge Instructions (Signed)
1)Avoid ibuprofen/Advil/Aleve/Motrin/Goody Powders/Naproxen/BC powders/Meloxicam/Diclofenac/Indomethacin and other Nonsteroidal anti-inflammatory medications as these will make you more likely to bleed and can cause stomach ulcers, can also cause Kidney problems.   2)Follow up with your nephrologist---Dr Marian Sorrow within a week for BMP recheck  3)Please note that there has been some changes to your medications  4)Your Aranesp dose or Frequency probably needs to be increased due to worsening anemia related to your kidney disease requiring more frequent transfusion

## 2020-09-27 NOTE — TOC Progression Note (Signed)
Transition of Care [facility]) - Progression Note    Patient Details  Name: Sheila Austin MRN: [id number] Date of Birth: 03/17/1986  Transition of Care [facility]) CM/SW Contact  [name], [place] Phone Number: 09/27/2020, 2:04 PM  Clinical Narrative:  LCSW spoke with pt about wheelchair again. She states she has a bariatric wheelchair and needs a standard one that will fit through her doorway at home. Pt agreeable to LCSW calling [facility]. LCSW spoke with Tye Maryland at [employer] and she will call pt to see if eligible for charity wheelchair. Pt's number provided.         Barriers to Discharge: Barriers Resolved  Expected Discharge Plan and Services           Expected Discharge Date: 09/27/20                                     Social Determinants of Health (SDOH) Interventions    Readmission Risk Interventions No flowsheet data found.

## 2020-09-27 NOTE — TOC Transition Note (Signed)
Transition of Care [facility]) - CM/SW Discharge Note   Patient Details  Name: [name] MRN: [id number] Date of Birth: 1985/10/14  Transition of Care [facility]) CM/SW Contact:  [name], LCSW Phone Number: 09/27/2020, 2:49 PM   Clinical Narrative:  Pt d/c today. She states [facility] has contacted her and she will follow up when she gets home. Home health PT and RN ordered and accepted by [facility]. Pt notified. No other needs reported.      Final next level of care: [place] Barriers to Discharge: Barriers Resolved   Patient Goals and CMS Choice     Choice offered to / list presented to : Patient  Discharge Placement                    Patient and family notified of of transfer: 09/27/20  Discharge Plan and Services                DME Arranged: N/A DME Agency: NA       HH Arranged: RN,PT [place] Agency: [place] ([place]) Date [place]: 09/27/20 Time [place]: [place] Representative spoke with at [place]: [place] Determinants of Health ([place]) Interventions     Readmission Risk Interventions No flowsheet data found.

## 2020-09-27 NOTE — Progress Notes (Signed)
Nsg Discharge Note  Admit Date:  09/24/2020 Discharge date: 09/27/2020   [name] to be D/C'd Home per MD order.  AVS completed.  Copy for chart, and copy for patient signed, and dated. Patient/caregiver able to verbalize understanding.  Discharge Medication: Allergies as of 09/27/2020      Reactions   Bee Venom Anaphylaxis, Hives, Shortness Of Breath, Swelling   Penicillins Anaphylaxis, Itching   Has patient had a PCN reaction causing immediate rash, facial/tongue/throat swelling, SOB or lightheadedness with hypotension: yes Has patient had a PCN reaction causing severe rash involving mucus membranes or skin necrosis: unknown Has patient had a PCN reaction that required hospitalization : yes Has patient had a PCN reaction occurring within the last 10 years: no If all of the above answers are "NO", then may proceed with Cephalosporin use.   Lovenox [enoxaparin Sodium] Other (See Comments)   Hyperkalemia? GI bleed      Medication List    STOP taking these medications   ibuprofen 200 MG tablet Commonly known as: ADVIL     TAKE these medications   amLODipine 10 MG tablet Commonly known as: NORVASC Take 1 tablet (10 mg total) by mouth daily. What changed:   medication strength  how much to take   carvedilol 12.5 MG tablet Commonly known as: COREG Take 1 tablet (12.5 mg total) by mouth 2 (two) times daily.   cloNIDine 0.1 mg/24hr patch Commonly known as: CATAPRES - Dosed in mg/24 hr Place 1 patch (0.1 mg total) onto the skin every Sunday. Once weekly Start taking on: Oct 02, 2020   [employer] Vitamin C 1000 MG tablet Generic drug: ascorbic acid Take 1,000 mg by mouth 2 (two) times daily.   [employer] Zinc Gluconate 50 MG tablet Generic drug: zinc gluconate Take 50 mg by mouth daily.   ferrous sulfate 325 (65 FE) MG tablet Take 1 tablet (325 mg total) by mouth daily with breakfast. What changed: when to take this   hydrALAZINE 100 MG tablet Commonly known as:  APRESOLINE Take 1 tablet (100 mg total) by mouth 3 (three) times daily.   insulin aspart 100 UNIT/ML FlexPen Commonly known as: NOVOLOG Inject 3 Units into the skin 3 (three) times daily with meals.   isosorbide mononitrate 30 MG 24 hr tablet Commonly known as: IMDUR Take 1 tablet (30 mg total) by mouth daily. Start taking on: Sep 28, 2020   Lantus 100 UNIT/ML injection Generic drug: insulin glargine Inject 0.18 mLs (18 Units total) into the skin at bedtime. What changed: how much to take   metolazone 2.5 MG tablet Commonly known as: ZAROXOLYN Take 2.5 mg by mouth daily.   ProAir HFA 108 (90 Base) MCG/ACT inhaler Generic drug: albuterol Inhale 2 puffs into the lungs every 6 (six) hours as needed for wheezing or shortness of breath.   promethazine 25 MG tablet Commonly known as: PHENERGAN Take 1 tablet (25 mg total) by mouth every 6 (six) hours as needed for nausea.   sodium bicarbonate 650 MG tablet Take 1 tablet (650 mg total) by mouth 2 (two) times daily.   tamsulosin 0.4 MG Caps capsule Commonly known as: FLOMAX Take 1 capsule (0.4 mg total) by mouth daily after supper. For Bladder   Vitamin D (Ergocalciferol) 1.25 MG (50000 UNIT) Caps capsule Commonly known as: DRISDOL Take 1 capsule by mouth once a week.            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  A cane, crutch, or walker will not resolve issue with performing activities of daily living. A wheelchair will allow patient to safely perform daily activities. Patient can safely propel the wheelchair in the home or has a caregiver who can provide assistance. Length of need Lifetime. Accessories:  elevating leg rests (ELRs), wheel locks, extensions and anti-tippers.   09/25/20 1736          Discharge Assessment: Vitals:   09/27/20 0840 09/27/20 1406  BP: (!) 143/81 106/66  Pulse: 76 79  Resp:  16  Temp:  98.1 F (36.7 C)  SpO2:  98%   Skin clean, dry and intact without evidence of skin break down, no evidence of skin tears noted. IV catheter discontinued intact. Site without signs and symptoms of complications - no redness or edema noted at insertion site, patient denies c/o pain - only slight tenderness at site.  Dressing with slight pressure applied.  D/c Instructions-Education: Discharge instructions given to patient/family with verbalized understanding. D/c education completed with patient/family including follow up instructions, medication list, d/c activities limitations if indicated, with other d/c instructions as indicated by MD - patient able to verbalize understanding, all questions fully answered. Patient instructed to return to ED, call 911, or call MD for any changes in condition.  Patient escorted via [place], and D/C home via private auto.  Dorcas Mcmurray, LPN [id number] PM

## 2020-09-27 NOTE — Discharge Summary (Signed)
Sheila Austin, is a 35 y.o. female  DOB [birth year]  MRN [id number].  Admission date:  09/24/2020  Admitting Physician  Roxan Hockey, MD  Discharge Date:  09/27/2020   Primary MD  [facility], [facility]  Recommendations for primary care physician for things to follow:   1)Avoid ibuprofen/Advil/Aleve/Motrin/Goody Powders/Naproxen/BC powders/Meloxicam/Diclofenac/Indomethacin and other Nonsteroidal anti-inflammatory medications as these will make you more likely to bleed and can cause stomach ulcers, can also cause Kidney problems.   2)Follow up with your nephrologist---Dr Marian Sorrow within a week for BMP recheck  3)Please note that there has been some changes to your medications  4)Your Aranesp dose or Frequency probably needs to be increased due to worsening anemia related to your kidney disease requiring more frequent transfusion  Admission Diagnosis  Hypertensive crisis [I16.9] Hypertensive urgency [I16.0] Normochromic normocytic anemia [D64.9] Intractable nausea and vomiting [R11.2] Non-intractable vomiting with nausea, unspecified vomiting type [R11.2] Acute renal failure superimposed on chronic kidney disease, unspecified CKD stage, unspecified acute renal failure type ([place]) [N17.9, N18.9]   Discharge Diagnosis  Hypertensive crisis [I16.9] Hypertensive urgency [I16.0] Normochromic normocytic anemia [D64.9] Intractable nausea and vomiting [R11.2] Non-intractable vomiting with nausea, unspecified vomiting type [R11.2] Acute renal failure superimposed on chronic kidney disease, unspecified CKD stage, unspecified acute renal failure type ([place]) [N17.9, N18.9]    Principal Problem:   Intractable nausea and vomiting Active Problems:   Acute urinary retention   Morbid obesity with BMI of 60.0-69.9, adult (HCC)   Gastroparesis   Pulmonary edema   Hyperkalemia   GERD (gastroesophageal  reflux disease)   Hypertensive urgency   Uncontrolled type 2 diabetes mellitus with hyperglycemia, without long-term current use of insulin (HCC)   Leukocytosis   Chronic respiratory failure with hypoxia (HCC)   Hyperphosphatemia   Hypoalbuminemia due to protein-calorie malnutrition (HCC)   Elevated lipase   Elevated troponin   Noncompliance with medication regimen   Acute renal failure superimposed on chronic kidney disease ([place])   Hypertensive crisis      Past Medical History:  Diagnosis Date  . Diabetes mellitus    uncontrolled, last A1c was 14  . E. coli sepsis ([place])   . Ectopic pregnancy   . Essential hypertension, benign 11/01/2008  . Fatty liver   . Gastroparesis   . GERD (gastroesophageal reflux disease)   . Helicobacter pylori gastritis 09/07/2011  . Migraine   . Morbid obesity with body mass index (BMI) of 40.0 to 49.9 ([place])   . Pulmonary edema   . Pyelonephritis   . Ureteral obstruction     Past Surgical History:  Procedure Laterality Date  . AMPUTATION Right 02/04/2019   Procedure: RIGHT BELOW KNEE AMPUTATION;  Surgeon: Newt Minion, MD;  Location: [place];  Service: Orthopedics;  Laterality: Right;  . COLONOSCOPY  2015   TI normal, colon appeared normal. Multiple biopsies benign.   . ESOPHAGOGASTRODUODENOSCOPY  2010   Normal  . WOUND DEBRIDEMENT Right 01/03/2019   Procedure: DEBRIDEMENT WOUND RIGHT LATERAL FOOT;  Surgeon: Angelia Mould, MD;  Location: [facility];  Service: Vascular;  Laterality: Right;      HPI  from the history and physical done on the day of admission:     Chief Complaint: Nausea and vomiting  HPI: ANTINETTE KEOUGH is a 35 y.o. female with medical history significant for T2DM, hypertension, CKD stage IV, chronic respiratory failure with supplemental oxygen 3 LPM.  History of noncompliance with medication who presents to the emergency department due to 3-day onset of nausea and vomiting, patient states that she has not been able to hold  anything down since onset of vomiting which was several episodes daily and was dark brown.  She complains of burning sensation in chest and throat area due to recurrent vomiting.  She denies fever, chills, abdominal pain or diarrhea.  EMS was activated, on arrival of EMS team, patient was noted to be in shortness of breath and supplemental oxygen was increased to 4 LPM.  Patient was taken to the ED for further evaluation and management.  ED Course:  In the emergency department, she was tachycardic, tachypneic and BP was elevated at 224/156.  O2 sat was 95 to 100% on supplemental oxygen at 4LPM.  Work-up in the ED showed leukocytosis, normocytic anemia, hyperkalemia, BUN/creatinine 53/4.38 (baseline creatinine at 1.6-2.3) with eGFR at 13, hypoalbuminemia, lipase 58, troponin x 1 - 22, hyperphosphatemia.  Influenza A, B and SARS coronavirus 2 was negative. Chest x-ray showed mild cardiomegaly and interstitial opacities, likely pulmonary edema. She was treated with hydralazine, diltiazem IV x1, labetalol and clonidine with no improvement in BP (SBP still greater than 200).  Patient was also treated with Phenergan, Compazine due to vomiting.  Hospitalist was asked to admit patient for further evaluation and management.     Hospital Course:    Brief Summary:- 35 y.o.femalewith medical history significant forT2DM, hypertension, CKD stageIV, chronic respiratory failure with supplemental oxygen 3 LPM. History of noncompliance with medication admitted on 09/24/2020 with intractable emesis, hypertensive urgency , AKI on CKD IV and urinary retention  A/p 1)intractable Emesis--- suspect gastroparesis -- patient who has been diabetic for about 20 years now  -Resolved with rectal and IV antiemetics -Lipase is 58 most likely due to intractable emesis, doubt significant acute pancreatitis -No further emesis, -Tolerating solid food - 2)Hypertensive Crisis--- initially in the ED BP was 224/156, repeat BP  after interventions in the ED was around210/115  BP improved on IV Cardene drip -Patient weaned off Cardene drip Continue clonidine patch 0.1 weekly, Amlodipine 10 mg daily Coreg 12.5 mg twice daily, hydralazine 100 mg 3 times daily and isosorbide 30 mg daily -Follow-up with nephrologist/PCP for further adjustment of BP meds  3)DM1--last A1c is 5.9 reflecting excellent diabetic control PTA insulin regimen adjusted   4)AKI----acute kidney injury on CKD stage -IV with hyperkalemia -This is due to intractable emesis with dehydration    creatinine on admission= 4.38  , baseline creatinine = 2.7 to 3.4 per [facility] - creatinine is now=4.76 (new peak)  --renally adjust medications, avoid nephrotoxic agents / dehydration  / hypotension -Hyperkalemia resolved after [facility] -Discussed with on-call nephrologist Dr. Moshe Cipro who recommends bicarb tablets and discharged home with outpatient follow-up with primary nephrologist Dr. Marian Sorrow in [place]  5)Acute Urinary retention - -In and out catheterization on 09/24/2020 -Flomax as ordered -Patient voiding better, postvoid residual less than 100 ml -Continue Flomax  6) patient with chronic hypoxic respiratory failure--- at baseline uses oxygen at 2 to 3 L via nasal cannula------ hypoxia has completely resolved  linically doubt significant CHF, echo with preserved EF of 60 to 65% without wall motion abnormalities -Patient apparently was placed on home O2 after being treated with pneumonia previously but never rechecked at this time hypoxia is resolved patient no longer requires oxygen - 7) iron deficiency anemia- -as well as anemia of CKD  --serum iron is 24, TIBC is 181  -Ferritin is not low B12 and folate are not low -denies menorrhagia, LMP more than 2 weeks ago,  -No active bleeding concerns -  baseline usually between 9 and 10 -Hemoglobin is up to 8.3 from 6.7 after transfusion of 2 units of PRBC -Consider GI consult  for endoluminal evaluation-appreciated -Colonoscopy in 2015 for diarrhea was overall normal and normal colonic biopsies. Last EGD in 2010 normal. -Patient apparently would like to have endoluminal evaluation as outpatient FOBT is neg -Iron tablets as advised, patient to follow-up with nephrologist for adjustments of her Aranesp dose and/or  frequency  8)Morbid Obesity- -Low calorie diet, portion control and increase physical activity discussed with patient -Body mass index is 60.48 kg/m.  9) constipation concerns----patient wants to do laxatives at home by herself  Disposition/--- discharge home with home health services  Disposition: The patient is from: Home  Anticipated d/c is to: Home with home health services  Code Status :  -  Code Status: Full Code    Discharge Condition: stable  Follow UP   [place] Follow up.   Why: Wheelchair Contact information: [address] [place] [address] [phone]        Derald Macleod, MD. Schedule an appointment as soon as possible for a visit in 1 week(s).   Specialty: Nephrology Contact information: [place] [address] [phone]               Diet and Activity recommendation:  As advised  Discharge Instructions    Discharge Instructions    Call MD for:  difficulty breathing, headache or visual disturbances   Complete by: As directed    Call MD for:  persistant dizziness or light-headedness   Complete by: As directed    Call MD for:  persistant nausea and vomiting   Complete by: As directed    Call MD for:  temperature >100.4   Complete by: As directed    Diet - low sodium heart healthy   Complete by: As directed    Diet Carb Modified   Complete by: As directed    Discharge instructions   Complete by: As directed    1)Avoid ibuprofen/Advil/Aleve/Motrin/Goody Powders/Naproxen/BC  powders/Meloxicam/Diclofenac/Indomethacin and other Nonsteroidal anti-inflammatory medications as these will make you more likely to bleed and can cause stomach ulcers, can also cause Kidney problems.   2)Follow up with your nephrologist---Dr Marian Sorrow within a week for BMP recheck  3)Please note that there has been some changes to your medications  4)Your Aranesp dose or Frequency probably needs to be increased due to worsening anemia related to your kidney disease requiring more frequent transfusion   Increase activity slowly   Complete by: As directed         Discharge Medications     Allergies as of 09/27/2020      Reactions   Bee Venom Anaphylaxis, Hives, Shortness Of Breath, Swelling   Penicillins Anaphylaxis, Itching   Has patient had a PCN reaction causing immediate rash, facial/tongue/throat swelling, SOB or lightheadedness with hypotension: yes Has patient had a PCN reaction  causing severe rash involving mucus membranes or skin necrosis: unknown Has patient had a PCN reaction that required hospitalization : yes Has patient had a PCN reaction occurring within the last 10 years: no If all of the above answers are "NO", then may proceed with Cephalosporin use.   Lovenox [enoxaparin Sodium] Other (See Comments)   Hyperkalemia? GI bleed      Medication List    STOP taking these medications   ibuprofen 200 MG tablet Commonly known as: ADVIL     TAKE these medications   amLODipine 10 MG tablet Commonly known as: NORVASC Take 1 tablet (10 mg total) by mouth daily. What changed:   medication strength  how much to take   carvedilol 12.5 MG tablet Commonly known as: COREG Take 1 tablet (12.5 mg total) by mouth 2 (two) times daily.   cloNIDine 0.1 mg/24hr patch Commonly known as: CATAPRES - Dosed in mg/24 hr Place 1 patch (0.1 mg total) onto the skin every Sunday. Once weekly Start taking on: Oct 02, 2020   [employer] Vitamin C 1000 MG tablet Generic drug: ascorbic  acid Take 1,000 mg by mouth 2 (two) times daily.   [employer] Zinc Gluconate 50 MG tablet Generic drug: zinc gluconate Take 50 mg by mouth daily.   ferrous sulfate 325 (65 FE) MG tablet Take 1 tablet (325 mg total) by mouth daily with breakfast. What changed: when to take this   hydrALAZINE 100 MG tablet Commonly known as: APRESOLINE Take 1 tablet (100 mg total) by mouth 3 (three) times daily.   insulin aspart 100 UNIT/ML FlexPen Commonly known as: NOVOLOG Inject 3 Units into the skin 3 (three) times daily with meals.   isosorbide mononitrate 30 MG 24 hr tablet Commonly known as: IMDUR Take 1 tablet (30 mg total) by mouth daily. Start taking on: Sep 28, 2020   Lantus 100 UNIT/ML injection Generic drug: insulin glargine Inject 0.18 mLs (18 Units total) into the skin at bedtime. What changed: how much to take   metolazone 2.5 MG tablet Commonly known as: ZAROXOLYN Take 2.5 mg by mouth daily.   ProAir HFA 108 (90 Base) MCG/ACT inhaler Generic drug: albuterol Inhale 2 puffs into the lungs every 6 (six) hours as needed for wheezing or shortness of breath.   promethazine 25 MG tablet Commonly known as: PHENERGAN Take 1 tablet (25 mg total) by mouth every 6 (six) hours as needed for nausea.   sodium bicarbonate 650 MG tablet Take 1 tablet (650 mg total) by mouth 2 (two) times daily.   tamsulosin 0.4 MG Caps capsule Commonly known as: FLOMAX Take 1 capsule (0.4 mg total) by mouth daily after supper. For Bladder   Vitamin D (Ergocalciferol) 1.25 MG (50000 UNIT) Caps capsule Commonly known as: DRISDOL Take 1 capsule by mouth once a week.            Durable Medical Equipment  (From admission, onward)         Start     Ordered   09/25/20 1736  For home use only DME standard manual wheelchair with seat cushion  Once       Comments: Patient suffers from Rt BKA and Morbid Obesity which impairs their ability to perform daily activities like bathing, dressing, feeding,  grooming, and toileting in the home.  A cane, crutch, or walker will not resolve issue with performing activities of daily living. A wheelchair will allow patient to safely perform daily activities. Patient can safely propel the wheelchair in the  home or has a caregiver who can provide assistance. Length of need Lifetime. Accessories: elevating leg rests (ELRs), wheel locks, extensions and anti-tippers.   09/25/20 1736          Major procedures and Radiology Reports - PLEASE review detailed and final reports for all details, in brief -   DG Chest Port 1 View  Result Date: 09/24/2020 CLINICAL DATA:  Shortness of breath EXAM: PORTABLE CHEST 1 VIEW COMPARISON:  05/19/2020 FINDINGS: Mild cardiomegaly with interstitial opacities. No pleural effusion or pneumothorax. IMPRESSION: Mild cardiomegaly and interstitial opacities, likely pulmonary edema. Electronically Signed   By: Ulyses Jarred M.D.   On: 09/24/2020 01:17   ECHOCARDIOGRAM COMPLETE  Result Date: 09/24/2020    ECHOCARDIOGRAM REPORT   Patient Name:   LAURIANA DENES Date of Exam: 09/24/2020 Medical Rec #:  [id number]        Height:       62.0 in Accession #:    [id number]       Weight:       330.0 lb Date of Birth:  [birth year]        BSA:          2.365 m Patient Age:    19 years         BP:           139/78 mmHg Patient Gender: F                HR:           96 bpm. Exam Location:  Inpatient Procedure: 2D Echo, 3D Echo, Cardiac Doppler, Color Doppler and Strain Analysis Indications:    CHF-Acute Systolic E07.12  History:        Patient has prior history of Echocardiogram examinations, most                 recent 10/05/2011. Signs/Symptoms:Shortness of Breath; Risk                 Factors:Hypertension and Diabetes. Chronic kidney disease.                 Vomiting. Cardiomegaly, Pulmonary edema. GERD Chronic                 respiratory failure with hypoxia.  Sonographer:    Darlina Sicilian RDCS Referring Phys: [id number] OLADAPO ADEFESO  Sonographer  Comments: Image acquisition challenging due to respiratory motion. IMPRESSIONS  1. Left ventricular ejection fraction, by estimation, is 60 to 65%. The left ventricle has normal function. The left ventricle has no regional wall motion abnormalities. There is mild left ventricular hypertrophy. Left ventricular diastolic function could not be evaluated.  2. Right ventricular systolic function is hyperdynamic. The right ventricular size is normal.  3. The pericardial effusion is circumferential. There is no evidence of cardiac tamponade.  4. The mitral valve is abnormal. Trivial mitral valve regurgitation.  5. The aortic valve is tricuspid. Aortic valve regurgitation is not visualized.  6. The inferior vena cava is dilated in size with <50% respiratory variability, suggesting right atrial pressure of 15 mmHg. FINDINGS  Left Ventricle: Left ventricular ejection fraction, by estimation, is 60 to 65%. The left ventricle has normal function. The left ventricle has no regional wall motion abnormalities. The left ventricular internal cavity size was normal in size. There is  mild left ventricular hypertrophy. Left ventricular diastolic function could not be evaluated due to atrial fibrillation. Left ventricular diastolic function could not be evaluated. Right Ventricle: The right ventricular  size is normal. No increase in right ventricular wall thickness. Right ventricular systolic function is hyperdynamic. Left Atrium: Left atrial size was normal in size. Right Atrium: Right atrial size was normal in size. Pericardium: Trivial pericardial effusion is present. The pericardial effusion is circumferential. There is no evidence of cardiac tamponade. Mitral Valve: The mitral valve is abnormal. There is mild thickening of the mitral valve leaflet(s). Trivial mitral valve regurgitation. Tricuspid Valve: The tricuspid valve is grossly normal. Tricuspid valve regurgitation is trivial. Aortic Valve: The aortic valve is tricuspid.  Aortic valve regurgitation is not visualized. Pulmonic Valve: The pulmonic valve was normal in structure. Pulmonic valve regurgitation is not visualized. Aorta: The aortic root and ascending aorta are structurally normal, with no evidence of dilitation. Venous: The inferior vena cava is dilated in size with less than 50% respiratory variability, suggesting right atrial pressure of 15 mmHg. IAS/Shunts: No atrial level shunt detected by color flow Doppler.  LEFT VENTRICLE PLAX 2D LVIDd:         4.67 cm  Diastology LVIDs:         2.94 cm  LV e' medial:    6.31 cm/s LV PW:         1.21 cm  LV E/e' medial:  23.3 LV IVS:        1.27 cm  LV e' lateral:   5.66 cm/s LVOT diam:     1.95 cm  LV E/e' lateral: 26.0 LV SV:         65 LV SV Index:   27 LVOT Area:     2.99 cm  RIGHT VENTRICLE RV S prime:     16.60 cm/s TAPSE (M-mode): 2.0 cm LEFT ATRIUM             Index       RIGHT ATRIUM           Index LA diam:        4.40 cm 1.86 cm/m  RA Area:     15.05 cm LA Vol (A2C):   29.7 ml 12.56 ml/m RA Volume:   35.10 ml  14.84 ml/m LA Vol (A4C):   52.0 ml 22.01 ml/m LA Biplane Vol: 44.9 ml 18.98 ml/m  AORTIC VALVE LVOT Vmax:   128.00 cm/s LVOT Vmean:  99.300 cm/s LVOT VTI:    0.216 m  AORTA Ao Root diam: 2.70 cm Ao Asc diam:  3.10 cm MITRAL VALVE MV Area (PHT): 4.93 cm     SHUNTS MV Decel Time: 154 msec     Systemic VTI:  0.22 m MV E velocity: 147.00 cm/s  Systemic Diam: 1.95 cm MV A velocity: 99.40 cm/s MV E/A ratio:  1.48 Lyman Bishop MD Electronically signed by Lyman Bishop MD Signature Date/Time: 09/24/2020/5:26:55 PM    Final     Micro Results    Recent Results (from the past 240 hour(s))  Resp Panel by RT-PCR (Flu A&B, Covid) Nasopharyngeal Swab     Status: None   Collection Time: 09/24/20 12:47 AM   Specimen: Nasopharyngeal Swab; Nasopharyngeal(NP) swabs in vial transport medium  Result Value Ref Range Status   SARS Coronavirus 2 by RT PCR NEGATIVE NEGATIVE Final    Comment: (NOTE) SARS-CoV-2 target  nucleic acids are NOT DETECTED.  The SARS-CoV-2 RNA is generally detectable in upper respiratory specimens during the acute phase of infection. The lowest concentration of SARS-CoV-2 viral copies this assay can detect is 138 copies/mL. A negative result does not preclude SARS-Cov-2 infection and should not be  used as the sole basis for treatment or other patient management decisions. A negative result may occur with  improper specimen collection/handling, submission of specimen other than nasopharyngeal swab, presence of viral mutation(s) within the areas targeted by this assay, and inadequate number of viral copies(<138 copies/mL). A negative result must be combined with clinical observations, patient history, and epidemiological information. The expected result is Negative.  Fact Sheet for Patients:  EntrepreneurPulse.com.au  Fact Sheet for Healthcare Providers:  IncredibleEmployment.be  This test is no t yet approved or cleared by the [place] FDA and  has been authorized for detection and/or diagnosis of SARS-CoV-2 by FDA under an Emergency Use Authorization (EUA). This EUA will remain  in effect (meaning this test can be used) for the duration of the COVID-19 declaration under Section 564(b)(1) of the Act, 21 U.S.C.section 360bbb-3(b)(1), unless the authorization is terminated  or revoked sooner.       Influenza A by PCR NEGATIVE NEGATIVE Final   Influenza B by PCR NEGATIVE NEGATIVE Final    Comment: (NOTE) The Xpert Xpress SARS-CoV-2/FLU/RSV plus assay is intended as an aid in the diagnosis of influenza from Nasopharyngeal swab specimens and should not be used as a sole basis for treatment. Nasal washings and aspirates are unacceptable for Xpert Xpress SARS-CoV-2/FLU/RSV testing.  Fact Sheet for Patients: EntrepreneurPulse.com.au  Fact Sheet for Healthcare  Providers: IncredibleEmployment.be  This test is not yet approved or cleared by the [place] FDA and has been authorized for detection and/or diagnosis of SARS-CoV-2 by FDA under an Emergency Use Authorization (EUA). This EUA will remain in effect (meaning this test can be used) for the duration of the COVID-19 declaration under Section 564(b)(1) of the Act, 21 U.S.C. section 360bbb-3(b)(1), unless the authorization is terminated or revoked.  Performed at [facility], [address], [place], [place] [address]   MRSA PCR Screening     Status: Abnormal   Collection Time: 09/24/20  1:19 PM   Specimen: Urine, Catheterized; Nasopharyngeal  Result Value Ref Range Status   MRSA by PCR POSITIVE (A) NEGATIVE Final    Comment: The GeneXpert MRSA Assay (FDA approved for NASAL specimens only), is one component of a comprehensive MRSA colonization surveillance program. It is not intended to diagnose MRSA infection nor to guide or monitor treatment for MRSA infections. CRITICAL RESULT CALLED TO, READ BACK BY AND VERIFIED WITH: Randa Lynn 09/24/2020 COLEMAN,R Performed at [facility], [address] [place], [place] [address]   Urine Culture     Status: None   Collection Time: 09/24/20  5:18 PM   Specimen: Urine, Catheterized  Result Value Ref Range Status   Specimen Description   Final    URINE, CATHETERIZED Performed at [facility], [address] [place], [place] [address]    Special Requests   Final    Normal Performed at [facility], [address] [place], [place] [address]    Culture   Final    NO GROWTH Performed at [facility], [place] [address] [place], [place] [address]    Report Status 09/26/2020 FINAL  Final   Today   Subjective    Lyndzee Teague today has no new complaints         Eating and drinking ok -Voiding really well  Patient has been seen and examined prior to discharge   Objective   Blood pressure 106/66, pulse  79, temperature 98.1 F (36.7 C), temperature source Oral, resp. rate 16, height 5' 2" (1.575 m), weight Marland Kitchen)  150.1 kg, SpO2 98 %.   Intake/Output Summary (Last 24 hours) at 09/27/2020 1408 Last data filed at 09/27/2020 2094 Gross per 24 hour  Intake 506.12 ml  Output 950 ml  Net -443.88 ml   Exam Gen:- Awake Alert, morbidly obese, HEENT:- [place].AT, No sclera icterus, Rt Eye Blindness Neck-Supple Neck,No JVD,.  Lungs-improved air movement, no wheezing CV- S1, S2 normal, regular  Abd-  +ve B.Sounds, Abd Soft, No tenderness, increased truncal adiposity Extremity/Skin:-Right BKA, left lower extremity with pedal pulses Psych-affect is appropriate, oriented x3 Neuro-generalized weakness, no new focal deficits, no tremors   Data Review   CBC w Diff:  Lab Results  Component Value Date   WBC 8.4 09/27/2020   HGB 8.3 (L) 09/27/2020   HCT 26.4 (L) 09/27/2020   PLT 198 09/27/2020   LYMPHOPCT 5 09/24/2020   MONOPCT 3 09/24/2020   EOSPCT 0 09/24/2020   BASOPCT 0 09/24/2020    CMP:  Lab Results  Component Value Date   NA 133 (L) 09/27/2020   K 4.1 09/27/2020   CL 107 09/27/2020   CO2 19 (L) 09/27/2020   BUN 53 (H) 09/27/2020   CREATININE 4.76 (H) 09/27/2020   CREATININE 0.64 09/26/2015   PROT 5.2 (L) 09/25/2020   ALBUMIN 2.0 (L) 09/27/2020   BILITOT 0.5 09/25/2020   ALKPHOS 81 09/25/2020   AST 11 (L) 09/25/2020   ALT 9 09/25/2020  .   Total Discharge time is about 33 minutes  Roxan Hockey M.D on 09/27/2020 at 2:08 PM  Go to [email or web address] -  for contact info  [facility] - Office  [phone]

## 2020-09-27 NOTE — Progress Notes (Signed)
Femoral central line removed without complications. Pressure held for 15 minutes and vaseline dressing applied. Patient instructed to lay flat for at least 1 hour. Patient verbalized understanding.

## 2020-09-29 NOTE — Telephone Encounter (Signed)
Tried to call pt, LMOVM for return call. 

## 2020-09-30 NOTE — Telephone Encounter (Signed)
Tried to call pt, LMOVM for return call. 

## 2020-10-04 NOTE — Telephone Encounter (Signed)
Letter mailed

## 2020-10-20 ENCOUNTER — Emergency Department (HOSPITAL_COMMUNITY): Payer: Medicaid Other

## 2020-10-20 ENCOUNTER — Inpatient Hospital Stay (HOSPITAL_COMMUNITY)
Admit: 2020-10-20 | Discharge: 2020-10-20 | Disposition: A | Discharge status: Home / Self Care | Payer: Medicaid Other | Attending: Family Medicine | Admitting: Family Medicine

## 2020-10-20 ENCOUNTER — Inpatient Hospital Stay (HOSPITAL_COMMUNITY): Payer: Medicaid Other

## 2020-10-20 ENCOUNTER — Encounter (HOSPITAL_COMMUNITY): Payer: Self-pay | Admitting: Family Medicine

## 2020-10-20 ENCOUNTER — Other Ambulatory Visit: Payer: Self-pay

## 2020-10-20 ENCOUNTER — Inpatient Hospital Stay (HOSPITAL_COMMUNITY)
Admission: EM | Admit: 2020-10-20 | Discharge: 2020-10-24 | DRG: 814 | Disposition: A | Discharge status: Home / Self Care | Payer: Medicaid Other | Attending: Family Medicine | Admitting: Family Medicine

## 2020-10-20 DIAGNOSIS — K219 Gastro-esophageal reflux disease without esophagitis: Secondary | ICD-10-CM | POA: Diagnosis present

## 2020-10-20 DIAGNOSIS — Z8249 Family history of ischemic heart disease and other diseases of the circulatory system: Secondary | ICD-10-CM

## 2020-10-20 DIAGNOSIS — R4182 Altered mental status, unspecified: Secondary | ICD-10-CM

## 2020-10-20 DIAGNOSIS — I130 Hypertensive heart and chronic kidney disease with heart failure and stage 1 through stage 4 chronic kidney disease, or unspecified chronic kidney disease: Secondary | ICD-10-CM | POA: Diagnosis present

## 2020-10-20 DIAGNOSIS — R112 Nausea with vomiting, unspecified: Secondary | ICD-10-CM | POA: Diagnosis present

## 2020-10-20 DIAGNOSIS — E1022 Type 1 diabetes mellitus with diabetic chronic kidney disease: Secondary | ICD-10-CM

## 2020-10-20 DIAGNOSIS — I878 Other specified disorders of veins: Secondary | ICD-10-CM

## 2020-10-20 DIAGNOSIS — E860 Dehydration: Secondary | ICD-10-CM | POA: Diagnosis present

## 2020-10-20 DIAGNOSIS — Z833 Family history of diabetes mellitus: Secondary | ICD-10-CM

## 2020-10-20 DIAGNOSIS — Z83438 Family history of other disorder of lipoprotein metabolism and other lipidemia: Secondary | ICD-10-CM

## 2020-10-20 DIAGNOSIS — I509 Heart failure, unspecified: Secondary | ICD-10-CM

## 2020-10-20 DIAGNOSIS — J9611 Chronic respiratory failure with hypoxia: Secondary | ICD-10-CM

## 2020-10-20 DIAGNOSIS — Z9981 Dependence on supplemental oxygen: Secondary | ICD-10-CM

## 2020-10-20 DIAGNOSIS — D72829 Elevated white blood cell count, unspecified: Principal | ICD-10-CM | POA: Diagnosis present

## 2020-10-20 DIAGNOSIS — R569 Unspecified convulsions: Secondary | ICD-10-CM | POA: Diagnosis not present

## 2020-10-20 DIAGNOSIS — D631 Anemia in chronic kidney disease: Secondary | ICD-10-CM | POA: Diagnosis present

## 2020-10-20 DIAGNOSIS — I5033 Acute on chronic diastolic (congestive) heart failure: Secondary | ICD-10-CM | POA: Diagnosis present

## 2020-10-20 DIAGNOSIS — Z20822 Contact with and (suspected) exposure to covid-19: Secondary | ICD-10-CM | POA: Diagnosis present

## 2020-10-20 DIAGNOSIS — N184 Chronic kidney disease, stage 4 (severe): Secondary | ICD-10-CM | POA: Diagnosis present

## 2020-10-20 DIAGNOSIS — D509 Iron deficiency anemia, unspecified: Secondary | ICD-10-CM | POA: Diagnosis present

## 2020-10-20 DIAGNOSIS — E46 Unspecified protein-calorie malnutrition: Secondary | ICD-10-CM | POA: Diagnosis present

## 2020-10-20 DIAGNOSIS — E43 Unspecified severe protein-calorie malnutrition: Secondary | ICD-10-CM | POA: Diagnosis present

## 2020-10-20 DIAGNOSIS — R6510 Systemic inflammatory response syndrome (SIRS) of non-infectious origin without acute organ dysfunction: Secondary | ICD-10-CM | POA: Diagnosis present

## 2020-10-20 DIAGNOSIS — E1143 Type 2 diabetes mellitus with diabetic autonomic (poly)neuropathy: Secondary | ICD-10-CM | POA: Diagnosis present

## 2020-10-20 DIAGNOSIS — Z89511 Acquired absence of right leg below knee: Secondary | ICD-10-CM | POA: Diagnosis not present

## 2020-10-20 DIAGNOSIS — Z6841 Body Mass Index (BMI) 40.0 and over, adult: Secondary | ICD-10-CM | POA: Diagnosis not present

## 2020-10-20 DIAGNOSIS — E1165 Type 2 diabetes mellitus with hyperglycemia: Secondary | ICD-10-CM | POA: Diagnosis present

## 2020-10-20 DIAGNOSIS — Z91030 Bee allergy status: Secondary | ICD-10-CM

## 2020-10-20 DIAGNOSIS — Z794 Long term (current) use of insulin: Secondary | ICD-10-CM

## 2020-10-20 DIAGNOSIS — Z8261 Family history of arthritis: Secondary | ICD-10-CM

## 2020-10-20 DIAGNOSIS — Z825 Family history of asthma and other chronic lower respiratory diseases: Secondary | ICD-10-CM

## 2020-10-20 DIAGNOSIS — Z8371 Family history of colonic polyps: Secondary | ICD-10-CM

## 2020-10-20 DIAGNOSIS — E1122 Type 2 diabetes mellitus with diabetic chronic kidney disease: Secondary | ICD-10-CM | POA: Diagnosis present

## 2020-10-20 DIAGNOSIS — E8809 Other disorders of plasma-protein metabolism, not elsewhere classified: Secondary | ICD-10-CM | POA: Diagnosis present

## 2020-10-20 DIAGNOSIS — Z818 Family history of other mental and behavioral disorders: Secondary | ICD-10-CM

## 2020-10-20 DIAGNOSIS — G928 Other toxic encephalopathy: Secondary | ICD-10-CM | POA: Diagnosis present

## 2020-10-20 DIAGNOSIS — E119 Type 2 diabetes mellitus without complications: Secondary | ICD-10-CM

## 2020-10-20 DIAGNOSIS — Z841 Family history of disorders of kidney and ureter: Secondary | ICD-10-CM

## 2020-10-20 DIAGNOSIS — G43909 Migraine, unspecified, not intractable, without status migrainosus: Secondary | ICD-10-CM | POA: Diagnosis present

## 2020-10-20 DIAGNOSIS — K5900 Constipation, unspecified: Secondary | ICD-10-CM | POA: Diagnosis present

## 2020-10-20 DIAGNOSIS — G249 Dystonia, unspecified: Secondary | ICD-10-CM | POA: Diagnosis present

## 2020-10-20 DIAGNOSIS — R404 Transient alteration of awareness: Secondary | ICD-10-CM | POA: Diagnosis present

## 2020-10-20 DIAGNOSIS — T450X5A Adverse effect of antiallergic and antiemetic drugs, initial encounter: Secondary | ICD-10-CM | POA: Diagnosis not present

## 2020-10-20 DIAGNOSIS — S88111S Complete traumatic amputation at level between knee and ankle, right lower leg, sequela: Secondary | ICD-10-CM

## 2020-10-20 DIAGNOSIS — Z823 Family history of stroke: Secondary | ICD-10-CM

## 2020-10-20 DIAGNOSIS — Z9114 Patient's other noncompliance with medication regimen: Secondary | ICD-10-CM

## 2020-10-20 DIAGNOSIS — Z888 Allergy status to other drugs, medicaments and biological substances status: Secondary | ICD-10-CM

## 2020-10-20 DIAGNOSIS — K3184 Gastroparesis: Secondary | ICD-10-CM | POA: Diagnosis present

## 2020-10-20 DIAGNOSIS — Z79899 Other long term (current) drug therapy: Secondary | ICD-10-CM

## 2020-10-20 DIAGNOSIS — R000 Tachycardia, unspecified: Secondary | ICD-10-CM | POA: Diagnosis present

## 2020-10-20 DIAGNOSIS — I10 Essential (primary) hypertension: Secondary | ICD-10-CM | POA: Diagnosis present

## 2020-10-20 DIAGNOSIS — Z880 Allergy status to penicillin: Secondary | ICD-10-CM

## 2020-10-20 HISTORY — DX: Heart failure, unspecified: I50.9

## 2020-10-20 LAB — CBC WITH DIFFERENTIAL/PLATELET
Abs Immature Granulocytes: 0.17 10*3/uL — ABNORMAL HIGH (ref 0.00–0.07)
Basophils Absolute: 0.1 10*3/uL (ref 0.0–0.1)
Basophils Relative: 0 %
Eosinophils Absolute: 0 10*3/uL (ref 0.0–0.5)
Eosinophils Relative: 0 %
HCT: 32.4 % — ABNORMAL LOW (ref 36.0–46.0)
Hemoglobin: 10.3 g/dL — ABNORMAL LOW (ref 12.0–15.0)
Immature Granulocytes: 1 %
Lymphocytes Relative: 3 %
Lymphs Abs: 0.6 10*3/uL — ABNORMAL LOW (ref 0.7–4.0)
MCH: 28.5 pg (ref 26.0–34.0)
MCHC: 31.8 g/dL (ref 30.0–36.0)
MCV: 89.8 fL (ref 80.0–100.0)
Monocytes Absolute: 0.5 10*3/uL (ref 0.1–1.0)
Monocytes Relative: 2 %
Neutro Abs: 18.3 10*3/uL — ABNORMAL HIGH (ref 1.7–7.7)
Neutrophils Relative %: 94 %
Platelets: 346 10*3/uL (ref 150–400)
RBC: 3.61 MIL/uL — ABNORMAL LOW (ref 3.87–5.11)
RDW: 15.2 % (ref 11.5–15.5)
WBC: 19.6 10*3/uL — ABNORMAL HIGH (ref 4.0–10.5)
nRBC: 0 % (ref 0.0–0.2)

## 2020-10-20 LAB — BRAIN NATRIURETIC PEPTIDE: B Natriuretic Peptide: 1253 pg/mL — ABNORMAL HIGH (ref 0.0–100.0)

## 2020-10-20 LAB — COMPREHENSIVE METABOLIC PANEL
ALT: 11 U/L (ref 0–44)
AST: 22 U/L (ref 15–41)
Albumin: 2.4 g/dL — ABNORMAL LOW (ref 3.5–5.0)
Alkaline Phosphatase: 124 U/L (ref 38–126)
Anion gap: 13 (ref 5–15)
BUN: 47 mg/dL — ABNORMAL HIGH (ref 6–20)
CO2: 15 mmol/L — ABNORMAL LOW (ref 22–32)
Calcium: 7.7 mg/dL — ABNORMAL LOW (ref 8.9–10.3)
Chloride: 110 mmol/L (ref 98–111)
Creatinine, Ser: 4.79 mg/dL — ABNORMAL HIGH (ref 0.44–1.00)
GFR, Estimated: 12 mL/min — ABNORMAL LOW (ref >60)
Glucose, Bld: 286 mg/dL — ABNORMAL HIGH (ref 70–99)
Potassium: 5.1 mmol/L (ref 3.5–5.1)
Sodium: 138 mmol/L (ref 135–145)
Total Bilirubin: 0.7 mg/dL (ref 0.3–1.2)
Total Protein: 6.3 g/dL — ABNORMAL LOW (ref 6.5–8.1)

## 2020-10-20 LAB — GLUCOSE, CAPILLARY
Glucose-Capillary: 213 mg/dL — ABNORMAL HIGH (ref 70–99)
Glucose-Capillary: 159 mg/dL — ABNORMAL HIGH (ref 70–99)

## 2020-10-20 LAB — BLOOD GAS, VENOUS
Acid-base deficit: 7.8 mmol/L — ABNORMAL HIGH (ref 0.0–2.0)
Bicarbonate: 17.8 mmol/L — ABNORMAL LOW (ref 20.0–28.0)
FIO2: 28
O2 Saturation: 81.2 %
Patient temperature: 37
pCO2, Ven: 37.2 mmHg — ABNORMAL LOW (ref 44.0–60.0)
pH, Ven: 7.293 (ref 7.250–7.430)
pO2, Ven: 48.4 mmHg — ABNORMAL HIGH (ref 32.0–45.0)

## 2020-10-20 LAB — URINALYSIS, COMPLETE (UACMP) WITH MICROSCOPIC
Bacteria, UA: NONE SEEN
Bilirubin Urine: NEGATIVE
Glucose, UA: >=500 mg/dL — AB
Hgb urine dipstick: NEGATIVE
Ketones, ur: 5 mg/dL — AB
Leukocytes,Ua: NEGATIVE
Nitrite: NEGATIVE
Protein, ur: >=300 mg/dL — AB
Specific Gravity, Urine: 1.016 (ref 1.005–1.030)
pH: 8 (ref 5.0–8.0)

## 2020-10-20 LAB — RAPID URINE DRUG SCREEN, HOSP PERFORMED
Amphetamines: NOT DETECTED
Barbiturates: NOT DETECTED
Benzodiazepines: NOT DETECTED
Cocaine: NOT DETECTED
Opiates: NOT DETECTED
Tetrahydrocannabinol: NOT DETECTED

## 2020-10-20 LAB — TROPONIN I (HIGH SENSITIVITY)
Troponin I (High Sensitivity): 20 ng/L — ABNORMAL HIGH (ref <18)
Troponin I (High Sensitivity): 27 ng/L — ABNORMAL HIGH (ref <18)

## 2020-10-20 LAB — PROTIME-INR
INR: 1.2 (ref 0.8–1.2)
Prothrombin Time: 14.8 seconds (ref 11.4–15.2)

## 2020-10-20 LAB — LACTIC ACID, PLASMA
Lactic Acid, Venous: 5.5 mmol/L (ref 0.5–1.9)
Lactic Acid, Venous: 2.7 mmol/L (ref 0.5–1.9)

## 2020-10-20 LAB — ETHANOL: Alcohol, Ethyl (B): <10 mg/dL (ref <10)

## 2020-10-20 LAB — RESP PANEL BY RT-PCR (FLU A&B, COVID) ARPGX2
Influenza A by PCR: NEGATIVE
Influenza B by PCR: NEGATIVE
SARS Coronavirus 2 by RT PCR: NEGATIVE

## 2020-10-20 LAB — CBG MONITORING, ED
Glucose-Capillary: 277 mg/dL — ABNORMAL HIGH (ref 70–99)
Glucose-Capillary: 245 mg/dL — ABNORMAL HIGH (ref 70–99)

## 2020-10-20 LAB — AMMONIA: Ammonia: 25 umol/L (ref 9–35)

## 2020-10-20 LAB — LIPASE, BLOOD: Lipase: 37 U/L (ref 11–51)

## 2020-10-20 LAB — HCG, QUANTITATIVE, PREGNANCY: hCG, Beta Chain, Quant, S: 3 m[IU]/mL (ref <5)

## 2020-10-20 MED ORDER — ISOSORBIDE MONONITRATE ER 30 MG PO TB24
30.0000 mg | ORAL_TABLET | Freq: Every day | ORAL | Status: DC
  Administered 2020-10-21 – 2020-10-24 (×4): 30 mg via ORAL
  Filled 2020-10-20 (×5): qty 1

## 2020-10-20 MED ORDER — LORAZEPAM 2 MG/ML IJ SOLN
INTRAMUSCULAR | Status: AC
  Administered 2020-10-20: 2 mg via INTRAVENOUS
  Filled 2020-10-20: qty 1

## 2020-10-20 MED ORDER — INSULIN ASPART 100 UNIT/ML IJ SOLN: 4.0000 [IU] | Freq: Three times a day (TID) | INTRAMUSCULAR | Status: DC

## 2020-10-20 MED ORDER — LORAZEPAM 2 MG/ML IJ SOLN
1.0000 mg | Freq: Four times a day (QID) | INTRAMUSCULAR | Status: DC | PRN
Start: 2020-10-20 — End: 2020-10-25

## 2020-10-20 MED ORDER — ZINC SULFATE 220 (50 ZN) MG PO CAPS
220.0000 mg | ORAL_CAPSULE | Freq: Every day | ORAL | Status: DC
  Administered 2020-10-21 – 2020-10-24 (×4): 220 mg via ORAL
  Filled 2020-10-20 (×5): qty 1

## 2020-10-20 MED ORDER — SODIUM CHLORIDE 0.9% FLUSH
3.0000 mL | INTRAVENOUS | Status: DC | PRN
  Administered 2020-10-20: 3 mL via INTRAVENOUS

## 2020-10-20 MED ORDER — LABETALOL HCL 5 MG/ML IV SOLN
10.0000 mg | INTRAVENOUS | Status: DC | PRN
  Administered 2020-10-20 – 2020-10-22 (×5): 10 mg via INTRAVENOUS
  Filled 2020-10-20 (×5): qty 4

## 2020-10-20 MED ORDER — PROCHLORPERAZINE EDISYLATE 10 MG/2ML IJ SOLN
10.0000 mg | Freq: Once | INTRAMUSCULAR | Status: AC
  Administered 2020-10-20: 10 mg via INTRAVENOUS
  Filled 2020-10-20: qty 2

## 2020-10-20 MED ORDER — SODIUM CHLORIDE 0.9% FLUSH: 3.0000 mL | Freq: Two times a day (BID) | INTRAVENOUS | Status: DC

## 2020-10-20 MED ORDER — AMLODIPINE BESYLATE 5 MG PO TABS
10.0000 mg | ORAL_TABLET | Freq: Every day | ORAL | Status: DC
  Administered 2020-10-21 – 2020-10-24 (×4): 10 mg via ORAL
  Filled 2020-10-20 (×5): qty 2

## 2020-10-20 MED ORDER — VITAMIN D (ERGOCALCIFEROL) 1.25 MG (50000 UNIT) PO CAPS
50000.0000 [IU] | ORAL_CAPSULE | ORAL | Status: DC
  Filled 2020-10-20: qty 1

## 2020-10-20 MED ORDER — ONDANSETRON HCL 4 MG/2ML IJ SOLN
4.0000 mg | Freq: Four times a day (QID) | INTRAMUSCULAR | Status: DC | PRN
  Administered 2020-10-21: 4 mg via INTRAVENOUS
  Filled 2020-10-20: qty 2

## 2020-10-20 MED ORDER — INSULIN ASPART 100 UNIT/ML IJ SOLN: 0.0000 [IU] | Freq: Every day | INTRAMUSCULAR | Status: DC

## 2020-10-20 MED ORDER — ONDANSETRON HCL 4 MG PO TABS: 4.0000 mg | ORAL_TABLET | Freq: Four times a day (QID) | ORAL | Status: DC | PRN

## 2020-10-20 MED ORDER — PROMETHAZINE HCL 12.5 MG PO TABS
25.0000 mg | ORAL_TABLET | Freq: Four times a day (QID) | ORAL | Status: DC | PRN
  Administered 2020-10-21: 25 mg via ORAL
  Filled 2020-10-20: qty 2

## 2020-10-20 MED ORDER — SODIUM CHLORIDE 0.9% FLUSH
3.0000 mL | Freq: Two times a day (BID) | INTRAVENOUS | Status: DC
  Administered 2020-10-20: 3 mL via INTRAVENOUS

## 2020-10-20 MED ORDER — INSULIN GLARGINE 100 UNIT/ML ~~LOC~~ SOLN
8.0000 [IU] | Freq: Every day | SUBCUTANEOUS | Status: DC
  Administered 2020-10-20 – 2020-10-23 (×4): 8 [IU] via SUBCUTANEOUS
  Filled 2020-10-20 (×5): qty 0.08

## 2020-10-20 MED ORDER — SODIUM CHLORIDE 0.9 % IV SOLN
250.0000 mL | INTRAVENOUS | Status: DC | PRN
  Administered 2020-10-20: 250 mL via INTRAVENOUS

## 2020-10-20 MED ORDER — DIPHENHYDRAMINE HCL 50 MG/ML IJ SOLN
25.0000 mg | Freq: Once | INTRAMUSCULAR | Status: DC
  Filled 2020-10-20: qty 1

## 2020-10-20 MED ORDER — METOCLOPRAMIDE HCL 5 MG/ML IJ SOLN
10.0000 mg | Freq: Once | INTRAMUSCULAR | Status: AC
  Administered 2020-10-20: 10 mg via INTRAVENOUS
  Filled 2020-10-20: qty 2

## 2020-10-20 MED ORDER — POLYETHYLENE GLYCOL 3350 17 G PO PACK
17.0000 g | PACK | Freq: Every day | ORAL | Status: DC | PRN
  Administered 2020-10-22: 17 g via ORAL
  Filled 2020-10-20: qty 1

## 2020-10-20 MED ORDER — METRONIDAZOLE 500 MG/100ML IV SOLN
500.0000 mg | Freq: Three times a day (TID) | INTRAVENOUS | Status: DC
  Administered 2020-10-20 – 2020-10-24 (×13): 500 mg via INTRAVENOUS
  Filled 2020-10-20 (×13): qty 100

## 2020-10-20 MED ORDER — BISACODYL 10 MG RE SUPP
10.0000 mg | Freq: Every day | RECTAL | Status: DC | PRN
  Administered 2020-10-22: 10 mg via RECTAL
  Filled 2020-10-20: qty 1

## 2020-10-20 MED ORDER — LEVETIRACETAM IN NACL 500 MG/100ML IV SOLN
500.0000 mg | Freq: Two times a day (BID) | INTRAVENOUS | Status: DC
  Administered 2020-10-20 – 2020-10-24 (×8): 500 mg via INTRAVENOUS
  Filled 2020-10-20 (×8): qty 100

## 2020-10-20 MED ORDER — ACETAMINOPHEN 650 MG RE SUPP: 650.0000 mg | Freq: Four times a day (QID) | RECTAL | Status: DC | PRN

## 2020-10-20 MED ORDER — ONDANSETRON HCL 4 MG/2ML IJ SOLN
4.0000 mg | Freq: Once | INTRAMUSCULAR | Status: AC
  Administered 2020-10-20: 4 mg via INTRAVENOUS
  Filled 2020-10-20: qty 2

## 2020-10-20 MED ORDER — CLONIDINE HCL 0.1 MG/24HR TD PTWK: 0.1000 mg | MEDICATED_PATCH | TRANSDERMAL | Status: DC

## 2020-10-20 MED ORDER — SODIUM CHLORIDE 0.9 % IV BOLUS
500.0000 mL | Freq: Once | INTRAVENOUS | Status: AC
  Administered 2020-10-20: 500 mL via INTRAVENOUS

## 2020-10-20 MED ORDER — SODIUM CHLORIDE 0.9% FLUSH: 10.0000 mL | INTRAVENOUS | Status: DC | PRN

## 2020-10-20 MED ORDER — LEVETIRACETAM IN NACL 1500 MG/100ML IV SOLN
1500.0000 mg | Freq: Once | INTRAVENOUS | Status: AC
  Administered 2020-10-20: 1500 mg via INTRAVENOUS
  Filled 2020-10-20: qty 100

## 2020-10-20 MED ORDER — HYDRALAZINE HCL 25 MG PO TABS
100.0000 mg | ORAL_TABLET | Freq: Three times a day (TID) | ORAL | Status: DC
  Administered 2020-10-20 – 2020-10-24 (×13): 100 mg via ORAL
  Filled 2020-10-20 (×14): qty 4

## 2020-10-20 MED ORDER — INSULIN GLARGINE 100 UNIT/ML ~~LOC~~ SOLN
18.0000 [IU] | Freq: Every day | SUBCUTANEOUS | Status: DC
  Filled 2020-10-20: qty 0.18

## 2020-10-20 MED ORDER — DIPHENHYDRAMINE HCL 50 MG/ML IJ SOLN
50.0000 mg | Freq: Once | INTRAMUSCULAR | Status: AC
  Administered 2020-10-20: 50 mg via INTRAVENOUS

## 2020-10-20 MED ORDER — LORAZEPAM 2 MG/ML IJ SOLN: 1.0000 mg | Freq: Once | INTRAMUSCULAR | Status: DC

## 2020-10-20 MED ORDER — ACETAMINOPHEN 325 MG PO TABS
650.0000 mg | ORAL_TABLET | Freq: Four times a day (QID) | ORAL | Status: DC | PRN
Start: 2020-10-20 — End: 2020-10-25
  Administered 2020-10-21: 650 mg via ORAL
  Filled 2020-10-20: qty 2

## 2020-10-20 MED ORDER — INSULIN ASPART 100 UNIT/ML ~~LOC~~ SOLN: 3.0000 [IU] | Freq: Three times a day (TID) | SUBCUTANEOUS | Status: DC

## 2020-10-20 MED ORDER — CIPROFLOXACIN IN D5W 400 MG/200ML IV SOLN
400.0000 mg | Freq: Once | INTRAVENOUS | Status: AC
  Administered 2020-10-20: 400 mg via INTRAVENOUS
  Filled 2020-10-20: qty 200

## 2020-10-20 MED ORDER — LORAZEPAM 2 MG/ML IJ SOLN: 2.0000 mg | Freq: Once | INTRAMUSCULAR | Status: AC

## 2020-10-20 MED ORDER — SODIUM CHLORIDE 0.9 % IV SOLN: INTRAVENOUS | Status: DC

## 2020-10-20 MED ORDER — TAMSULOSIN HCL 0.4 MG PO CAPS
0.4000 mg | ORAL_CAPSULE | Freq: Every day | ORAL | Status: DC
  Administered 2020-10-20 – 2020-10-24 (×5): 0.4 mg via ORAL
  Filled 2020-10-20 (×5): qty 1

## 2020-10-20 MED ORDER — ASCORBIC ACID 500 MG PO TABS
1000.0000 mg | ORAL_TABLET | Freq: Two times a day (BID) | ORAL | Status: DC
  Administered 2020-10-20 – 2020-10-24 (×8): 1000 mg via ORAL
  Filled 2020-10-20 (×9): qty 2

## 2020-10-20 MED ORDER — TRAZODONE HCL 50 MG PO TABS
50.0000 mg | ORAL_TABLET | Freq: Every evening | ORAL | Status: DC | PRN
Start: 2020-10-20 — End: 2020-10-25

## 2020-10-20 MED ORDER — CHLORHEXIDINE GLUCONATE CLOTH 2 % EX PADS
6.0000 | MEDICATED_PAD | Freq: Every day | CUTANEOUS | Status: DC
  Administered 2020-10-20 – 2020-10-24 (×5): 6 via TOPICAL

## 2020-10-20 MED ORDER — HEPARIN SODIUM (PORCINE) 5000 UNIT/ML IJ SOLN
5000.0000 [IU] | Freq: Three times a day (TID) | INTRAMUSCULAR | Status: DC
  Administered 2020-10-20 – 2020-10-24 (×12): 5000 [IU] via SUBCUTANEOUS
  Filled 2020-10-20 (×13): qty 1

## 2020-10-20 MED ORDER — FERROUS SULFATE 325 (65 FE) MG PO TABS
325.0000 mg | ORAL_TABLET | Freq: Every day | ORAL | Status: DC
  Administered 2020-10-21 – 2020-10-24 (×4): 325 mg via ORAL
  Filled 2020-10-20 (×5): qty 1

## 2020-10-20 MED ORDER — SODIUM CHLORIDE 0.9 % IV SOLN
2.0000 g | INTRAVENOUS | Status: DC
  Administered 2020-10-20 – 2020-10-24 (×5): 2 g via INTRAVENOUS
  Filled 2020-10-20 (×5): qty 20

## 2020-10-20 MED ORDER — LACTATED RINGERS IV SOLN: INTRAVENOUS | Status: DC

## 2020-10-20 MED ORDER — CARVEDILOL 12.5 MG PO TABS
12.5000 mg | ORAL_TABLET | Freq: Two times a day (BID) | ORAL | Status: DC
  Administered 2020-10-20 – 2020-10-24 (×8): 12.5 mg via ORAL
  Filled 2020-10-20 (×9): qty 1

## 2020-10-20 MED ORDER — INSULIN ASPART 100 UNIT/ML IJ SOLN
0.0000 [IU] | Freq: Three times a day (TID) | INTRAMUSCULAR | Status: DC
  Administered 2020-10-20 (×2): 5 [IU] via SUBCUTANEOUS
  Administered 2020-10-21: 2 [IU] via SUBCUTANEOUS
  Filled 2020-10-20: qty 1

## 2020-10-20 MED ORDER — METRONIDAZOLE 500 MG/100ML IV SOLN
500.0000 mg | Freq: Once | INTRAVENOUS | Status: AC
  Administered 2020-10-20: 500 mg via INTRAVENOUS
  Filled 2020-10-20: qty 100

## 2020-10-20 MED ORDER — ALBUTEROL SULFATE HFA 108 (90 BASE) MCG/ACT IN AERS: 2.0000 | INHALATION_SPRAY | Freq: Four times a day (QID) | RESPIRATORY_TRACT | Status: DC | PRN

## 2020-10-20 MED ORDER — SODIUM BICARBONATE 650 MG PO TABS
650.0000 mg | ORAL_TABLET | Freq: Two times a day (BID) | ORAL | Status: DC
  Administered 2020-10-20 – 2020-10-24 (×8): 650 mg via ORAL
  Filled 2020-10-20 (×9): qty 1

## 2020-10-20 MED ORDER — SODIUM CHLORIDE 0.9% FLUSH
10.0000 mL | Freq: Two times a day (BID) | INTRAVENOUS | Status: DC
  Administered 2020-10-20 – 2020-10-23 (×7): 10 mL
  Administered 2020-10-24: 30 mL

## 2020-10-20 NOTE — ED Provider Notes (Signed)
[facility] EMERGENCY DEPARTMENT Provider Note   CSN: [id number] Arrival date & time: 10/20/20  [id number]     History Chief Complaint  Patient presents with   Altered Mental Status    Sheila Austin is a 35 y.o. female.  According to the patient's husband and she had an episode where she was shaking and then passed out.  Patient when she awoke stated that she has been having vomiting for a number days and weakness.  When paramedics arrived she was obtunded.  When she was first seen by my colleague she would see a few words and then when I saw her 15 minutes later she was mildly lethargic but oriented x4  The history is provided by the patient and a relative. No language interpreter was used.  Altered Mental Status Presenting symptoms: behavior changes   Severity:  Moderate Most recent episode:  Today Episode history:  Single Duration:  50 minutes Timing:  Intermittent Progression:  Improving Chronicity:  New Context: not alcohol use       Past Medical History:  Diagnosis Date   Acute exacerbation of CHF (congestive heart failure) ([place]) 10/20/2020   Diabetes mellitus    uncontrolled, last A1c was 14   E. coli sepsis (HCC)    Ectopic pregnancy    Essential hypertension, benign 11/01/2008   Fatty liver    Gastroparesis    GERD (gastroesophageal reflux disease)    Helicobacter pylori gastritis 09/07/2011   Migraine    Morbid obesity with body mass index (BMI) of 40.0 to 49.9 [facility])    Pulmonary edema    Pyelonephritis    Ureteral obstruction     Patient Active Problem List   Diagnosis Date Noted   Acute exacerbation of CHF (congestive heart failure) ([place]) 10/20/2020   Morbid obesity with BMI of 60.0-69.9, adult ([place]) 09/27/2020   Leukocytosis 09/24/2020   Hyperphosphatemia 09/24/2020   Hypoalbuminemia due to protein-calorie malnutrition ([place]) 09/24/2020   Elevated lipase 09/24/2020   Elevated troponin 09/24/2020   Noncompliance with medication regimen 09/24/2020    Acute renal failure superimposed on chronic kidney disease ([place]) 09/24/2020   Hypertensive crisis 09/24/2020   Acute urinary retention 09/24/2020   Nausea & vomiting 10/10/2019   Hypokalemia 10/10/2019   CKD (chronic kidney disease), stage III ([place]) 10/10/2019   CKD (chronic kidney disease), stage III ([place]) 10/10/2019   Cellulitis of right lower extremity 08/23/2019   Uncontrolled type 2 diabetes mellitus with hyperglycemia, without long-term current use of insulin ([place]) 08/23/2019   Intractable nausea and vomiting 08/23/2019   GERD with esophagitis 08/23/2019   Lactic acidosis 08/23/2019   Hyperosmolar hyperglycemic state (HHS) ([place]) 05/16/2019   AKI (acute kidney injury) ([place]) 05/16/2019   S/P AKA (above knee amputation), 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   GERD (gastroesophageal reflux disease)    Hypertensive urgency    Benign essential HTN    Unilateral complete BKA, right, sequela (HCC)    Hyperkalemia    Unilateral complete BKA, right, initial encounter ([place]) 02/09/2019   Acquired absence of right leg below knee (HCC)    Hyponatremia    Acute blood loss anemia    Sinus tachycardia    Post-operative pain    Bacteremia    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HCC)    Streptococcal bacteremia    Klebsiella pneumoniae infection    Abscess of leg, right    Skin ulcer of right foot with necrosis of muscle ([place])    Severe protein-calorie malnutrition ([place])    DM (  diabetes mellitus) ([place]) 02/01/2019   Adjustment disorder 02/01/2019   Sepsis due to cellulitis ([place]) 02/01/2019   Cellulitis in diabetic foot ([place]) 12/31/2018   Obesity 12/31/2018   Facial cellulitis 07/02/2018   Class 3 severe obesity with body mass index (BMI) of 40.0 to 44.9 in adult ([place]) 07/09/2017   Abscess of right breast 02/24/2016   BMI 45.0-49.9, adult ([place]) 09/29/2015   Skin lesions - right lower extremity 12/07/2013   Morbid obesity with BMI of 50.0-59.9, adult ([place]) 10/05/2011   Pulmonary edema 10/05/2011    Obstructive sleep apnea 10/05/2011   HLD (hyperlipidemia) 09/27/2011   Migraine 09/07/2011   Essential hypertension, benign 11/01/2008   Gastroparesis 09/27/2008   IRREGULAR MENSES 08/24/2008   Diabetes mellitus out of control ([place]) 07/04/2006    Past Surgical History:  Procedure Laterality Date   AMPUTATION Right 02/04/2019   Procedure: RIGHT BELOW KNEE AMPUTATION;  Surgeon: Newt Minion, MD;  Location: [facility];  Service: Orthopedics;  Laterality: Right;   COLONOSCOPY  2015   TI normal, colon appeared normal. Multiple biopsies benign.    ESOPHAGOGASTRODUODENOSCOPY  2010   Normal   WOUND DEBRIDEMENT Right 01/03/2019   Procedure: DEBRIDEMENT WOUND RIGHT LATERAL FOOT;  Surgeon: Angelia Mould, MD;  Location: [facility];  Service: Vascular;  Laterality: Right;     OB History     Gravida  1   Para      Term      Preterm      AB  1   Living  0      SAB      IAB      Ectopic  1   Multiple      Live Births              Family History  Problem Relation Age of Onset   Arthritis Mother    Asthma Mother    COPD Mother    Depression Mother    Diabetes Mother    Hypertension Mother    Mental illness Mother    Kidney disease Mother    Crohn's disease Mother    Colon polyps Mother        in her 29s. Unknown if adenomas     Aneurysm Father 71       brain   Mental illness Sister    Crohn's disease Sister    Arthritis Maternal Aunt    Asthma Maternal Aunt    COPD Maternal Aunt    Depression Maternal Aunt    Diabetes Maternal Aunt    Hypertension Maternal Aunt    Mental illness Maternal Aunt    Stroke Maternal Aunt    Heart failure Maternal Aunt    Heart failure Maternal Grandfather    Cancer Maternal Grandfather    Diabetes Maternal Grandfather    Heart disease Maternal Grandfather    Hypertension Maternal Grandfather    Hyperlipidemia Maternal Grandfather    Mental illness Brother    Diabetes Maternal Grandmother    Heart disease Maternal  Grandmother    Hypertension Maternal Grandmother    Hyperlipidemia Maternal Grandmother    Diabetes Paternal Grandmother    Heart disease Paternal Grandmother    Diabetes Paternal Grandfather    Cancer Paternal Grandfather    Colon cancer Neg Hx     Social History   Tobacco Use   Smoking status: Never   Smokeless tobacco: Never  Vaping Use   Vaping Use: Never used  Substance Use Topics  Alcohol use: Not Currently   Drug use: Never    Home Medications Prior to Admission medications   Medication Sig Start Date End Date Taking? Authorizing Provider  amLODipine (NORVASC) 10 MG tablet Take 1 tablet (10 mg total) by mouth daily. 09/27/20 09/27/21  Roxan Hockey, MD  carvedilol (COREG) 12.5 MG tablet Take 1 tablet (12.5 mg total) by mouth 2 (two) times daily. 09/27/20   Roxan Hockey, MD  cloNIDine (CATAPRES - DOSED IN MG/24 HR) 0.1 mg/24hr patch Place 1 patch (0.1 mg total) onto the skin every Sunday. Once weekly 10/02/20   Roxan Hockey, MD  [employer] VITAMIN C 1000 MG tablet Take 1,000 mg by mouth 2 (two) times daily. 10/29/19   [provider]  [employer] ZINC GLUCONATE 50 MG tablet Take 50 mg by mouth daily. 10/29/19   [provider]  ferrous sulfate 325 (65 FE) MG tablet Take 1 tablet (325 mg total) by mouth daily with breakfast. 09/27/20   Roxan Hockey, MD  hydrALAZINE (APRESOLINE) 100 MG tablet Take 1 tablet (100 mg total) by mouth 3 (three) times daily. 09/27/20   Roxan Hockey, MD  insulin aspart (NOVOLOG) 100 UNIT/ML FlexPen Inject 3 Units into the skin 3 (three) times daily with meals. 09/27/20   Roxan Hockey, MD  isosorbide mononitrate (IMDUR) 30 MG 24 hr tablet Take 1 tablet (30 mg total) by mouth daily. 09/28/20   Roxan Hockey, MD  LANTUS 100 UNIT/ML injection Inject 0.18 mLs (18 Units total) into the skin at bedtime. 09/27/20   Roxan Hockey, MD  metolazone (ZAROXOLYN) 2.5 MG tablet Take 2.5 mg by mouth daily. 07/11/20   [provider]   PROAIR HFA 108 (90 Base) MCG/ACT inhaler Inhale 2 puffs into the lungs every 6 (six) hours as needed for wheezing or shortness of breath. 05/20/20   [provider]  promethazine (PHENERGAN) 25 MG tablet Take 1 tablet (25 mg total) by mouth every 6 (six) hours as needed for nausea. 09/27/20   Roxan Hockey, MD  sodium bicarbonate 650 MG tablet Take 1 tablet (650 mg total) by mouth 2 (two) times daily. 09/27/20 09/27/21  Roxan Hockey, MD  tamsulosin (FLOMAX) 0.4 MG CAPS capsule Take 1 capsule (0.4 mg total) by mouth daily after supper. For Bladder 09/27/20   Roxan Hockey, MD  Vitamin D, Ergocalciferol, (DRISDOL) 1.25 MG (50000 UNIT) CAPS capsule Take 1 capsule by mouth once a week. 07/11/20   [provider]    Allergies    Bee venom, Penicillins, and Lovenox [enoxaparin sodium]  Review of Systems   Review of Systems  Unable to perform ROS: Mental status change   Physical Exam Updated Vital Signs BP (!) 182/101   Pulse (!) 123   Temp 98.6 F (37 C) (Rectal)   Resp (!) 31   Ht '5\' 2"'$  (1.575 m)   Wt (!) 150.1 kg   LMP  (LMP Unknown) Comment: pt AMS unable to ask  SpO2 97%   BMI 60.52 kg/m   Physical Exam Vitals and nursing note reviewed.  Constitutional:      Appearance: She is well-developed.     Comments: Lethargic  HENT:     Head: Normocephalic.     Nose: Nose normal.  Eyes:     General: No scleral icterus.    Conjunctiva/sclera: Conjunctivae normal.  Neck:     Thyroid: No thyromegaly.  Cardiovascular:     Rate and Rhythm: Regular rhythm. Tachycardia present.     Heart sounds: No murmur heard.  No friction rub. No gallop.  Pulmonary:     Breath sounds: No stridor. No wheezing or rales.  Chest:     Chest wall: No tenderness.  Abdominal:     General: There is no distension.     Tenderness: There is no abdominal tenderness. There is no rebound.  Musculoskeletal:        General: Normal range of motion.     Cervical back: Neck supple.      Comments: Pulmonary amputation right  Lymphadenopathy:     Cervical: No cervical adenopathy.  Skin:    Findings: No erythema or rash.  Neurological:     Mental Status: She is oriented to person, place, and time.     Motor: No abnormal muscle tone.     Coordination: Coordination normal.  Psychiatric:        Behavior: Behavior normal.    ED Results / Procedures / Treatments   Labs (all labs ordered are listed, but only abnormal results are displayed) Labs Reviewed  COMPREHENSIVE METABOLIC PANEL - Abnormal; Notable for the following components:      Result Value   CO2 15 (*)    Glucose, Bld 286 (*)    BUN 47 (*)    Creatinine, Ser 4.79 (*)    Calcium 7.7 (*)    Total Protein 6.3 (*)    Albumin 2.4 (*)    GFR, Estimated 12 (*)    All other components within normal limits  CBC WITH DIFFERENTIAL/PLATELET - Abnormal; Notable for the following components:   WBC 19.6 (*)    RBC 3.61 (*)    Hemoglobin 10.3 (*)    HCT 32.4 (*)    Neutro Abs 18.3 (*)    Lymphs Abs 0.6 (*)    Abs Immature Granulocytes 0.17 (*)    All other components within normal limits  URINALYSIS, COMPLETE (UACMP) WITH MICROSCOPIC - Abnormal; Notable for the following components:   Glucose, UA >=500 (*)    Ketones, ur 5 (*)    Protein, ur >=300 (*)    All other components within normal limits  LACTIC ACID, PLASMA - Abnormal; Notable for the following components:   Lactic Acid, Venous 5.5 (*)    All other components within normal limits  BRAIN NATRIURETIC PEPTIDE - Abnormal; Notable for the following components:   B Natriuretic Peptide 1,253.0 (*)    All other components within normal limits  CBG MONITORING, ED - Abnormal; Notable for the following components:   Glucose-Capillary 277 (*)    All other components within normal limits  TROPONIN I (HIGH SENSITIVITY) - Abnormal; Notable for the following components:   Troponin I (High Sensitivity) 20 (*)    All other components within normal limits  CULTURE,  BLOOD (ROUTINE X 2)  CULTURE, BLOOD (ROUTINE X 2)  URINE CULTURE  RESP PANEL BY RT-PCR (FLU A&B, COVID) ARPGX2  RAPID URINE DRUG SCREEN, HOSP PERFORMED  ETHANOL  PROTIME-INR  AMMONIA  HCG, QUANTITATIVE, PREGNANCY  LIPASE, BLOOD  BLOOD GAS, VENOUS  LACTIC ACID, PLASMA  POC URINE PREG, ED  I-STAT CHEM 8, ED    EKG None  Radiology CT ABDOMEN PELVIS WO CONTRAST  Result Date: 10/20/2020 CLINICAL DATA:  Found unresponsive, recent CPR EXAM: CT ABDOMEN AND PELVIS WITHOUT CONTRAST TECHNIQUE: Multidetector CT imaging of the abdomen and pelvis was performed following the standard protocol without IV contrast. COMPARISON:  06/25/2020 FINDINGS: Lower chest: Bilateral pleural effusions are noted right greater than left increased from the prior exam. Small pericardial  effusion is noted. Hepatobiliary: No focal liver abnormality is seen. No gallstones, gallbladder wall thickening, or biliary dilatation. Pancreas: Unremarkable. No pancreatic ductal dilatation or surrounding inflammatory changes. Spleen: Normal in size without focal abnormality. Adrenals/Urinary Tract: Adrenal glands are within normal limits bilaterally. Kidneys are well visualized bilaterally without renal calculi or obstructive changes. Bladder is well distended. Stomach/Bowel: Appendix is not well visualized. No obstructive or inflammatory changes of the colon are seen. Small bowel and stomach appear within normal limits. Vascular/Lymphatic: No significant vascular findings are present. No enlarged abdominal or pelvic lymph nodes. Reproductive: Uterus and bilateral adnexa are unremarkable. Other: No abdominal wall hernia or abnormality. No abdominopelvic ascites. Musculoskeletal: No acute bony abnormality is noted. Generalized soft tissue edema is noted consistent with a degree of anasarca. IMPRESSION: Bilateral effusions right greater than left increased from the prior exam. Changes consistent with anasarca. No other focal abnormality is  seen. Electronically Signed   By: Inez Catalina M.D.   On: 10/20/2020 08:10   CT HEAD WO CONTRAST  Result Date: 10/20/2020 CLINICAL DATA:  Found unresponsive, history of recent CPR EXAM: CT HEAD WITHOUT CONTRAST TECHNIQUE: Contiguous axial images were obtained from the base of the skull through the vertex without intravenous contrast. COMPARISON:  05/10/2020 FINDINGS: Brain: No evidence of acute infarction, hemorrhage, hydrocephalus, extra-axial collection or mass lesion/mass effect. Vascular: No hyperdense vessel or unexpected calcification. Skull: Normal. Negative for fracture or focal lesion. Sinuses/Orbits: No acute finding. Other: None. IMPRESSION: No acute abnormality noted. Electronically Signed   By: Inez Catalina M.D.   On: 10/20/2020 08:07   DG Chest Portable 1 View  Result Date: 10/20/2020 CLINICAL DATA:  Altered mental status EXAM: PORTABLE CHEST 1 VIEW COMPARISON:  Radiograph 09/24/2020, chest CT 11/08/2019 FINDINGS: Unchanged, enlarged cardiac silhouette. There are right upper lobe airspace opacities, appearance possibly in part due to overlying soft tissues. Small bilateral pleural effusions and bibasilar atelectasis. Mild interstitial opacities. No acute osseous abnormality. IMPRESSION: Cardiomegaly with diffuse interstitial opacities, likely pulmonary edema. Right upper lung opacities, which may in part be due to overlying soft tissues, developing infection or alveolar edema cannot be excluded. Small bilateral pleural effusions with adjacent atelectasis. Electronically Signed   By: Maurine Simmering   On: 10/20/2020 07:43    Procedures Procedures   Medications Ordered in ED Medications  ciprofloxacin (CIPRO) IVPB 400 mg (0 mg Intravenous Stopped 10/20/20 0915)    And  metroNIDAZOLE (FLAGYL) IVPB 500 mg (500 mg Intravenous New Bag/Given 10/20/20 0917)  lactated ringers infusion ( Intravenous New Bag/Given 10/20/20 0828)  insulin aspart (novoLOG) injection 0-15 Units (has no administration in  time range)  insulin aspart (novoLOG) injection 0-5 Units (has no administration in time range)  insulin aspart (novoLOG) injection 4 Units (has no administration in time range)  ascorbic acid (VITAMIN C) tablet 1,000 mg (has no administration in time range)  zinc sulfate capsule 220 mg (has no administration in time range)  albuterol (VENTOLIN HFA) 108 (90 Base) MCG/ACT inhaler 2 puff (has no administration in time range)  Vitamin D (Ergocalciferol) (DRISDOL) capsule 50,000 Units (has no administration in time range)  promethazine (PHENERGAN) tablet 25 mg (has no administration in time range)  amLODipine (NORVASC) tablet 10 mg (has no administration in time range)  carvedilol (COREG) tablet 12.5 mg (has no administration in time range)  hydrALAZINE (APRESOLINE) tablet 100 mg (has no administration in time range)  tamsulosin (FLOMAX) capsule 0.4 mg (has no administration in time range)  ferrous sulfate tablet 325 mg (has  no administration in time range)  cloNIDine (CATAPRES - Dosed in mg/24 hr) patch 0.1 mg (has no administration in time range)  isosorbide mononitrate (IMDUR) 24 hr tablet 30 mg (has no administration in time range)  insulin glargine (LANTUS) injection 18 Units (has no administration in time range)  insulin aspart (novoLOG) injection 3 Units (has no administration in time range)  sodium bicarbonate tablet 650 mg (has no administration in time range)  sodium chloride flush (NS) 0.9 % injection 3 mL (has no administration in time range)  sodium chloride flush (NS) 0.9 % injection 3 mL (has no administration in time range)  sodium chloride flush (NS) 0.9 % injection 3 mL (has no administration in time range)  0.9 %  sodium chloride infusion (has no administration in time range)  acetaminophen (TYLENOL) tablet 650 mg (has no administration in time range)    Or  acetaminophen (TYLENOL) suppository 650 mg (has no administration in time range)  traZODone (DESYREL) tablet 50 mg (has  no administration in time range)  polyethylene glycol (MIRALAX / GLYCOLAX) packet 17 g (has no administration in time range)  bisacodyl (DULCOLAX) suppository 10 mg (has no administration in time range)  ondansetron (ZOFRAN) tablet 4 mg (has no administration in time range)    Or  ondansetron (ZOFRAN) injection 4 mg (has no administration in time range)  heparin injection 5,000 Units (has no administration in time range)  labetalol (NORMODYNE) injection 10 mg (has no administration in time range)  metoCLOPramide (REGLAN) injection 10 mg (has no administration in time range)  sodium chloride 0.9 % bolus 500 mL (0 mLs Intravenous Stopped 10/20/20 0811)  ondansetron (ZOFRAN) injection 4 mg (4 mg Intravenous Given 10/20/20 0805)  prochlorperazine (COMPAZINE) injection 10 mg (10 mg Intravenous Given 10/20/20 0825)   CRITICAL CARE Performed by: Milton Ferguson Total critical care time: 45 minutes Critical care time was exclusive of separately billable procedures and treating other patients. Critical care was necessary to treat or prevent imminent or life-threatening deterioration. Critical care was time spent personally by me on the following activities: development of treatment plan with patient and/or surrogate as well as nursing, discussions with consultants, evaluation of patient's response to treatment, examination of patient, obtaining history from patient or surrogate, ordering and performing treatments and interventions, ordering and review of laboratory studies, ordering and review of radiographic studies, pulse oximetry and re-evaluation of patient's condition. Patient has been treated for sepsis.  She has a white count of 20,000 and a lactic of 5.5.  It is possible that she had a seizure initially.  She will be admitted to medicine ED Course  I have reviewed the triage vital signs and the nursing notes.  Pertinent labs & imaging results that were available during my care of the patient were  reviewed by me and considered in my medical decision making (see chart for details).    MDM Rules/Calculators/A&P                         Patient with altered mental status.  Possible sepsis possible seizure Final Clinical Impression(s) / ED Diagnoses Final diagnoses:  None    Rx / DC Orders ED Discharge Orders     None        Milton Ferguson, MD 10/20/20 [phone]

## 2020-10-20 NOTE — Progress Notes (Signed)
EEG Completed; Results Pending  

## 2020-10-20 NOTE — ED Notes (Signed)
Beth, RN to attempt [place] IV without success.

## 2020-10-20 NOTE — Progress Notes (Addendum)
Arrived to unit via stretcher, transferred to bed and placed on monitors with  4 person total assists. Patient very lethargic, answering yes/no questions and some orientation questions. Alert to self and situation.

## 2020-10-20 NOTE — ED Notes (Addendum)
Attempted to update fiance Otis on patient status at this time. No answer

## 2020-10-20 NOTE — ED Notes (Signed)
Sheila Austin, legal guardian, updated at this time on patient status.

## 2020-10-20 NOTE — ED Notes (Signed)
Patient transported to CT 

## 2020-10-20 NOTE — Progress Notes (Signed)
Notified provider of need to order repeat lactic acid, bedside RN aware.

## 2020-10-20 NOTE — ED Notes (Signed)
Patient had episode of jerking sensation per phelbotomy; patient alert and able to follow directions during event. Dr. Denton Brick messaged at this time.

## 2020-10-20 NOTE — ED Notes (Signed)
Patient started having jerking sensation to head, followed by hands, patient remaind alert for 1 minute then proceeded to tonic clonic seizure. Dr. Roderic Palau at bedside for orders for ativan. Patient pale in face and oxygenation during seizure. Patient has now snoring respirations and dr. zammit remains ay bedside

## 2020-10-20 NOTE — Progress Notes (Signed)
Pt unavailable for EEG at this time- getting a central line; will attempt later as schedule permits.

## 2020-10-20 NOTE — ED Notes (Signed)
Per Dr. Roderic Palau, patient has hx of CHF and we would not initiate fluid bolus due to this contraindication.

## 2020-10-20 NOTE — ED Notes (Signed)
Patient transported to MRI 

## 2020-10-20 NOTE — Progress Notes (Signed)
Notified provider of need to order fluid bolus, pt needs 4503 cc, bedside RN aware.

## 2020-10-20 NOTE — ED Notes (Signed)
Patient in the room at this time. Patient placed back on the monitor and fluids and patient's gown changed at this time.

## 2020-10-20 NOTE — Progress Notes (Signed)
Elink following for code sepsis 

## 2020-10-20 NOTE — ED Notes (Signed)
Date and time results received: 10/20/20 0730 (use smartphrase ".now" to insert current time)  Test: Lactic Critical Value: 5.5  Name of Provider Notified: Roderic Palau, MD  Orders Received? Or Actions Taken?:

## 2020-10-20 NOTE — Progress Notes (Signed)
Per Dr. Dewayne Hatch proceed with CT A/P without upreg result @ 0735 10/20/2020

## 2020-10-20 NOTE — H&P (Signed)
Patient Demographics:    Sheila Austin, is a 35 y.o. 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  Etiology, blood cultures pending -Treat empirically with Rocephin and Flagyl --CT abdomen and pelvis with bilateral pleural effusions and anasarca otherwise no acute findings -Chest x-ray without evidence of acute infection, does show possible pulmonary edema and pleural effusions --Lactic acid 5.5, repeat lactic acid 2.7 -WBC is [id number]  2)Acute Metabolic Encephalopathy--- patient arrived in the ED lethargic after being found unresponsive at home with concern for possible seizures While in the ED being administered IV Reglan she had involuntary movements concerning for possible Reglan induced Dystonic Reaction Versus Seizures -Patient received IV lorazepam and IV Benadryl and involuntary movements ceased--- MRI Brain and MRA Head w/o acute stroke--- IMPRESSION: Areas of abnormal diffusion and T2 signal in the frontal lobes, occipital lobes, and cerebellum. May reflect  seizure effect. Hypoxic/ischemic injury is a consideration in the appropriate setting. Other etiologies such as infectious/inflammatory are less likely. -CT head without acute findings --Serum ammonia is 25 -UDS Neg -EEG pending- -Patient received lorazepam and IV Keppra in the ED, neurology consult pending  3)HFpEF--suspect chronic diastolic dysfunction CHF, echo from 09/24/2020 with EF of 60 to 65%, -BNP is 1253, -Troponin is 20 ---CT abdomen and pelvis with bilateral pleural effusions and anasarca otherwise no acute findings -Chest x-ray  show possible pulmonary edema and pleural effusions -Avoid over aggressive diuresis at this time given concerns about possible sepsis with lactic acidosis  4)Intractable Emesis--suspect due to diabetic gastroparesis, lipase WNL, continue antiemetics, avoid Reglan given concerns about possible dystonic reaction  5)HTN--restart Amlodipine, Coreg, hydralazine isosorbide, use IV labetalol as needed for elevated BP  6)DM1--last A1c is 5.9 reflecting excellent diabetic control PTA insulin regimen adjusted -Use Novolog/Humalog Sliding scale insulin with Accu-Cheks/Fingersticks as ordered   7) CKD stage -IV --renal function may worsen in the setting of  intractable emesis with dehydration    Prior  baseline creatinine = 2.7 to 3.4 per [facility] -Over the last month however creatinine has been around 4.7 --renally adjust medications, avoid nephrotoxic agents / dehydration  / hypotension Pt follows-up with primary nephrologist Dr. Marian Sorrow in [place]  8)Recurrent episodes of acute urinary retention--- Foley placed on 10/20/2020, give Flomax  9)Recurrent hypoxic respiratory failure--- at baseline uses oxygen at 2 to 3 L via nasal cannula------ -Patient apparently was placed on home O2 after being treated with pneumonia previously  -During patient's recent hospitalization hypoxia resolved  10)Iron deficiency anemia- -as well as anemia of  CKD  Hgb is currently 10.3 --serum iron is 24, TIBC is 181 -Ferritin is not low B12 and folate are not low -denies menorrhagia, -No active bleeding concerns -  baseline usually between 9 and 10 -Colonoscopy in 2015 for diarrhea was overall normal and normal colonic biopsies. Last EGD in 2010 normal. -Patient apparently would like to have endoluminal evaluation as outpatient FOBT is neg recently -Iron tablets as advised, patient to follow-up with nephrologist for adjustments of her Aranesp dose and/or  frequency   11)Morbid Obesity- -Low calorie diet, portion control and increase physical activity discussed with patient -Body mass index is 60.52 kg/m.  Disposition/Need for in-Hospital Stay- patient unable to be discharged at this time due to --SIRS requiring IV antibiotics and IV fluids pending culture data, possible seizures new onset requiring further work-up  Status is: Inpatient  Remains inpatient appropriate because: see disposition above  Dispo: The patient is from: Home              Anticipated d/c is to: Home              Anticipated d/c date is: > 3 days              Patient currently is not medically stable to d/c. Barriers: Not Clinically Stable-   With History of - Reviewed by me  Past Medical History:  Diagnosis Date   Acute exacerbation of CHF (congestive heart failure) ([place]) 10/20/2020   Diabetes mellitus    uncontrolled, last A1c was 14   E. coli sepsis ([place])    Ectopic pregnancy    Essential hypertension, benign 11/01/2008   Fatty liver    Gastroparesis    GERD (gastroesophageal reflux disease)    Helicobacter pylori gastritis 09/07/2011   Migraine    Morbid obesity with body mass index (BMI) of 40.0 to 49.9 ([place])    Pulmonary edema    Pyelonephritis    Ureteral obstruction       Past Surgical History:  Procedure Laterality Date   AMPUTATION Right 02/04/2019   Procedure: RIGHT BELOW KNEE AMPUTATION;  Surgeon: Newt Minion, MD;  Location: [facility];   Service: Orthopedics;  Laterality: Right;   COLONOSCOPY  2015   TI normal, colon appeared normal. Multiple biopsies benign.    ESOPHAGOGASTRODUODENOSCOPY  2010   Normal   WOUND DEBRIDEMENT Right 01/03/2019   Procedure: DEBRIDEMENT WOUND RIGHT LATERAL FOOT;  Surgeon: Angelia Mould, MD;  Location: [facility];  Service: Vascular;  Laterality: Right;      Chief Complaint  Patient presents with   Altered Mental Status      HPI:    Sheila Austin  is a 35 y.o. female with pmhx significant for T2DM,CKD stage IV, chronic hypoxic respiratory failure with supplemental oxygen 3 LPM, HTN  History of noncompliance with medication presented on 10/21/2019 with with intractable emesis-- --Patient initially presented to the ED due to concerns of being unresponsive at home, patient apparently had jerking movements with shaking and then apparently passed out at home, family apparently started CPR when EMS got there patient had a pulse patient and glucose was 300  -while in the  ED being administered IV Reglan she had involuntary movements concerning for possible Reglan induced dystonic reaction Versus seizures -Patient received IV lorazepam and IV Benadryl and involuntary movements ceased--- -Additional work-up in ED ED revealed leukocytosis tachycardia tachypnea and elevated lactic acid concerning for possible sepsis EDP gave IV antibiotics  MRI Brain and MRA Head w/o acute stroke--- IMPRESSION: Areas of abnormal diffusion and T2 signal in the frontal lobes, occipital lobes, and cerebellum. May reflect seizure effect. Hypoxic/ischemic injury is a consideration in the appropriate setting. Other etiologies such as infectious/inflammatory are less likely. -CT head without acute findings -CT abdomen and pelvis with bilateral pleural effusions and anasarca otherwise no acute findings -UDS Neg - -Lactic acid 5.5, repeat lactic acid 2.7 -Serum ammonia is 25 -BNP is 1253 -Creatinine is 4.79 -WBCs 19.6  with hemoglobin of 10.3 -Troponin is 20   Review of systems:    In addition to the HPI above,   A full Review of  Systems was done, all other systems reviewed are negative except as noted above in HPI , .    Social History:  Reviewed by me    Social History   Tobacco Use   Smoking status: Never   Smokeless tobacco: Never  Substance Use Topics   Alcohol use: Not Currently    Family History :  Reviewed by me    Family History  Problem Relation Age of Onset   Arthritis Mother    Asthma Mother    COPD Mother    Depression Mother    Diabetes Mother    Hypertension Mother    Mental illness Mother    Kidney disease Mother    Crohn's disease Mother    Colon polyps Mother        in her 71s. Unknown if adenomas     Aneurysm Father 25       brain   Mental illness Sister    Crohn's disease Sister    Arthritis Maternal Aunt    Asthma Maternal Aunt    COPD Maternal Aunt    Depression Maternal Aunt    Diabetes Maternal Aunt    Hypertension Maternal Aunt    Mental illness Maternal Aunt    Stroke Maternal Aunt    Heart failure Maternal Aunt    Heart failure Maternal Grandfather    Cancer Maternal Grandfather    Diabetes Maternal Grandfather    Heart disease Maternal Grandfather    Hypertension Maternal Grandfather    Hyperlipidemia Maternal Grandfather    Mental illness Brother    Diabetes Maternal Grandmother    Heart disease Maternal Grandmother    Hypertension Maternal Grandmother    Hyperlipidemia Maternal Grandmother    Diabetes Paternal Grandmother    Heart disease Paternal Grandmother    Diabetes Paternal Grandfather    Cancer Paternal Grandfather    Colon cancer Neg Hx      Home Medications:   Prior to Admission medications   Medication Sig Start Date End Date Taking? Authorizing Provider  amLODipine (NORVASC) 10 MG tablet Take 1 tablet (10 mg total) by mouth daily. 09/27/20 09/27/21 Yes Ashawnti Tangen, MD  bumetanide (BUMEX) 1 MG tablet Take 1 mg by  mouth in the morning, at noon, in the evening, and at bedtime.   Yes [provider]  carvedilol (COREG) 12.5 MG tablet Take 1 tablet (12.5 mg total) by mouth 2 (two) times daily. 09/27/20  Yes Dajanay Northrup, MD  cloNIDine (CATAPRES - DOSED IN MG/24 HR) 0.1 mg/24hr patch Place  1 patch (0.1 mg total) onto the skin every Sunday. Once weekly 10/02/20  Yes China Deitrick, MD  [employer] VITAMIN C 1000 MG tablet Take 1,000 mg by mouth 2 (two) times daily. 10/29/19  Yes [provider]  [employer] ZINC GLUCONATE 50 MG tablet Take 50 mg by mouth daily. 10/29/19  Yes [provider]  ferrous sulfate 325 (65 FE) MG tablet Take 1 tablet (325 mg total) by mouth daily with breakfast. 09/27/20  Yes Ronelle Michie, MD  hydrALAZINE (APRESOLINE) 100 MG tablet Take 1 tablet (100 mg total) by mouth 3 (three) times daily. 09/27/20  Yes Kery Haltiwanger, MD  insulin aspart (NOVOLOG) 100 UNIT/ML FlexPen Inject 3 Units into the skin 3 (three) times daily with meals. 09/27/20  Yes Roxan Hockey, MD  isosorbide mononitrate (IMDUR) 30 MG 24 hr tablet Take 1 tablet (30 mg total) by mouth daily. 09/28/20  Yes Sinda Leedom, MD  LANTUS 100 UNIT/ML injection Inject 0.18 mLs (18 Units total) into the skin at bedtime. 09/27/20  Yes Joyann Spidle, MD  metolazone (ZAROXOLYN) 2.5 MG tablet Take 2.5 mg by mouth daily. 07/11/20  Yes [provider]  PROAIR HFA 108 (90 Base) MCG/ACT inhaler Inhale 2 puffs into the lungs every 6 (six) hours as needed for wheezing or shortness of breath. 05/20/20  Yes [provider]  promethazine (PHENERGAN) 25 MG tablet Take 1 tablet (25 mg total) by mouth every 6 (six) hours as needed for nausea. 09/27/20  Yes Tomeika Weinmann, MD  sodium bicarbonate 650 MG tablet Take 1 tablet (650 mg total) by mouth 2 (two) times daily. 09/27/20 09/27/21 Yes Zarai Orsborn, MD  tamsulosin (FLOMAX) 0.4 MG CAPS capsule Take 1 capsule (0.4 mg total) by mouth daily after supper. For  Bladder 09/27/20  Yes Christorpher Hisaw, MD  Vitamin D, Ergocalciferol, (DRISDOL) 1.25 MG (50000 UNIT) CAPS capsule Take 1 capsule by mouth once a week. 07/11/20  Yes [provider]     Allergies:     Allergies  Allergen Reactions   Bee Venom Anaphylaxis, Hives, Shortness Of Breath and Swelling   Penicillins Anaphylaxis and Itching    Has patient had a PCN reaction causing immediate rash, facial/tongue/throat swelling, SOB or lightheadedness with hypotension: yes Has patient had a PCN reaction causing severe rash involving mucus membranes or skin necrosis: unknown Has patient had a PCN reaction that required hospitalization : yes Has patient had a PCN reaction occurring within the last 10 years: no If all of the above answers are "NO", then may proceed with Cephalosporin use.    Lorazepam     Other reaction(s): Hallucinations   Lovenox [Enoxaparin Sodium] Other (See Comments)    Hyperkalemia? GI bleed   Metoclopramide Other (See Comments)    Dystonia following metoclopramide injection     Physical Exam:   Vitals  Blood pressure (!) 189/99, pulse 100, temperature 98.3 F (36.8 C), temperature source Axillary, resp. rate (!) 26, height '5\' 2"'$  (1.575 m), weight (!) 150.1 kg, SpO2 100 %.  Physical Examination: General appearance -sleepy after IV Benadryl and IV lorazepam, and in no distress Mental status - lethargic after iv benadryl and lorazepam Eyes - sclera anicteric Nose- [place] 3L/Min Neck - supple, no JVD elevation , Chest - clear  to auscultation bilaterally, symmetrical air movement,  Heart - S1 and S2 normal, regular  Abdomen - soft, nontender, nondistended, increased truncal adiposity Neurological -  Limited exam due to lethargy after Benadryl and lorazepam , no obvious new focal deficits at  this time  extremities - Rt BKA Skin - warm, dry     Data Review:    CBC Recent Labs  Lab 10/20/20 0704  WBC 19.6*  HGB 10.3*  HCT 32.4*  PLT 346  MCV 89.8  MCH  28.5  MCHC 31.8  RDW 15.2  LYMPHSABS 0.6*  MONOABS 0.5  EOSABS 0.0  BASOSABS 0.1   ------------------------------------------------------------------------------------------------------------------  Chemistries  Recent Labs  Lab 10/20/20 0704  NA 138  K 5.1  CL 110  CO2 15*  GLUCOSE 286*  BUN 47*  CREATININE 4.79*  CALCIUM 7.7*  AST 22  ALT 11  ALKPHOS 124  BILITOT 0.7   ------------------------------------------------------------------------------------------------------------------ estimated creatinine clearance is 24 mL/min (A) (by C-G formula based on SCr of 4.79 mg/dL (H)). ------------------------------------------------------------------------------------------------------------------ No results for input(s): TSH, T4TOTAL, T3FREE, THYROIDAB in the last 72 hours.  Invalid input(s): FREET3   Coagulation profile Recent Labs  Lab 10/20/20 0704  INR 1.2   ------------------------------------------------------------------------------------------------------------------- No results for input(s): DDIMER in the last 72 hours. -------------------------------------------------------------------------------------------------------------------  Cardiac Enzymes No results for input(s): CKMB, TROPONINI, MYOGLOBIN in the last 168 hours.  Invalid input(s): CK ------------------------------------------------------------------------------------------------------------------    Component Value Date/Time   BNP 1,253.0 (H) 10/20/2020 0703     ---------------------------------------------------------------------------------------------------------------  Urinalysis    Component Value Date/Time   COLORURINE YELLOW 10/20/2020 0653   APPEARANCEUR CLEAR 10/20/2020 0653   LABSPEC 1.016 10/20/2020 0653   PHURINE 8.0 10/20/2020 0653   GLUCOSEU >=500 (A) 10/20/2020 0653   HGBUR NEGATIVE 10/20/2020 0653   BILIRUBINUR NEGATIVE 10/20/2020 0653   BILIRUBINUR negative  08/13/2017 1622   KETONESUR 5 (A) 10/20/2020 0653   PROTEINUR >=300 (A) 10/20/2020 0653   UROBILINOGEN 0.2 08/13/2017 1622   UROBILINOGEN 0.2 11/25/2014 0553   NITRITE NEGATIVE 10/20/2020 0653   LEUKOCYTESUR NEGATIVE 10/20/2020 0653    ----------------------------------------------------------------------------------------------------------------   Imaging Results:    CT ABDOMEN PELVIS WO CONTRAST  Result Date: 10/20/2020 CLINICAL DATA:  Found unresponsive, recent CPR EXAM: CT ABDOMEN AND PELVIS WITHOUT CONTRAST TECHNIQUE: Multidetector CT imaging of the abdomen and pelvis was performed following the standard protocol without IV contrast. COMPARISON:  06/25/2020 FINDINGS: Lower chest: Bilateral pleural effusions are noted right greater than left increased from the prior exam. Small pericardial effusion is noted. Hepatobiliary: No focal liver abnormality is seen. No gallstones, gallbladder wall thickening, or biliary dilatation. Pancreas: Unremarkable. No pancreatic ductal dilatation or surrounding inflammatory changes. Spleen: Normal in size without focal abnormality. Adrenals/Urinary Tract: Adrenal glands are within normal limits bilaterally. Kidneys are well visualized bilaterally without renal calculi or obstructive changes. Bladder is well distended. Stomach/Bowel: Appendix is not well visualized. No obstructive or inflammatory changes of the colon are seen. Small bowel and stomach appear within normal limits. Vascular/Lymphatic: No significant vascular findings are present. No enlarged abdominal or pelvic lymph nodes. Reproductive: Uterus and bilateral adnexa are unremarkable. Other: No abdominal wall hernia or abnormality. No abdominopelvic ascites. Musculoskeletal: No acute bony abnormality is noted. Generalized soft tissue edema is noted consistent with a degree of anasarca. IMPRESSION: Bilateral effusions right greater than left increased from the prior exam. Changes consistent with anasarca.  No other focal abnormality is seen. Electronically Signed   By: Inez Catalina M.D.   On: 10/20/2020 08:10   CT HEAD WO CONTRAST  Result Date: 10/20/2020 CLINICAL DATA:  Found unresponsive, history of recent CPR EXAM: CT HEAD WITHOUT CONTRAST TECHNIQUE: Contiguous axial images were obtained from the base of the skull through the vertex without intravenous contrast. COMPARISON:  05/10/2020  FINDINGS: Brain: No evidence of acute infarction, hemorrhage, hydrocephalus, extra-axial collection or mass lesion/mass effect. Vascular: No hyperdense vessel or unexpected calcification. Skull: Normal. Negative for fracture or focal lesion. Sinuses/Orbits: No acute finding. Other: None. IMPRESSION: No acute abnormality noted. Electronically Signed   By: Inez Catalina M.D.   On: 10/20/2020 08:07   MR ANGIO HEAD WO CONTRAST  Result Date: 10/20/2020 CLINICAL DATA:  Seizure EXAM: MRI HEAD WITHOUT CONTRAST MRA HEAD WITHOUT CONTRAST TECHNIQUE: Multiplanar, multi-echo pulse sequences of the brain and surrounding structures were acquired without intravenous contrast. Angiographic images of the Circle of Willis were acquired using MRA technique without intravenous contrast. COMPARISON: No pertinent prior exam. COMPARISON:  No pertinent prior exam. FINDINGS: MRI HEAD FINDINGS Motion artifact is present. Brain: There is cortical diffusion hyperintensity with corresponding T2 hyperintensity in the frontal lobes bilaterally involving a small area of adjoining middle and precentral gyri. Foci of cortical T2 hyperintensity are noted in the occipital lobes bilaterally. There is patchy T2 hyperintensity in the cerebellum bilaterally. No evidence of intracranial hemorrhage. No intracranial mass or mass effect. Patchy foci of T2 hyperintensity in the supratentorial and pontine white matter nonspecific but may reflect mild chronic microvascular ischemic changes. Vascular: Major vessel flow voids at the skull base are preserved. Skull and upper  cervical spine: Marrow signal is grossly within normal limits. Sinuses/Orbits: Trace paranasal sinus mucosal thickening. Orbits are unremarkable. Other: Mastoid air cells are clear.  Sella is unremarkable. MRA HEAD FINDINGS Motion artifact is present. Anterior circulation: Intracranial internal carotid arteries are patent. Proximal anterior and middle cerebral arteries are patent. Posterior circulation: Intracranial vertebral arteries are patent. Basilar artery is patent. Proximal posterior cerebral arteries are patent. IMPRESSION: Areas of abnormal diffusion and T2 signal in the frontal lobes, occipital lobes, and cerebellum. May reflect seizure effect. Hypoxic/ischemic injury is a consideration in the appropriate setting. Other etiologies such as infectious/inflammatory are less likely. No proximal intracranial vessel occlusion. Electronically Signed   By: Macy Mis M.D.   On: 10/20/2020 13:18   MR BRAIN WO CONTRAST  Result Date: 10/20/2020 CLINICAL DATA:  Seizure EXAM: MRI HEAD WITHOUT CONTRAST MRA HEAD WITHOUT CONTRAST TECHNIQUE: Multiplanar, multi-echo pulse sequences of the brain and surrounding structures were acquired without intravenous contrast. Angiographic images of the Circle of Willis were acquired using MRA technique without intravenous contrast. COMPARISON: No pertinent prior exam. COMPARISON:  No pertinent prior exam. FINDINGS: MRI HEAD FINDINGS Motion artifact is present. Brain: There is cortical diffusion hyperintensity with corresponding T2 hyperintensity in the frontal lobes bilaterally involving a small area of adjoining middle and precentral gyri. Foci of cortical T2 hyperintensity are noted in the occipital lobes bilaterally. There is patchy T2 hyperintensity in the cerebellum bilaterally. No evidence of intracranial hemorrhage. No intracranial mass or mass effect. Patchy foci of T2 hyperintensity in the supratentorial and pontine white matter nonspecific but may reflect mild chronic  microvascular ischemic changes. Vascular: Major vessel flow voids at the skull base are preserved. Skull and upper cervical spine: Marrow signal is grossly within normal limits. Sinuses/Orbits: Trace paranasal sinus mucosal thickening. Orbits are unremarkable. Other: Mastoid air cells are clear.  Sella is unremarkable. MRA HEAD FINDINGS Motion artifact is present. Anterior circulation: Intracranial internal carotid arteries are patent. Proximal anterior and middle cerebral arteries are patent. Posterior circulation: Intracranial vertebral arteries are patent. Basilar artery is patent. Proximal posterior cerebral arteries are patent. IMPRESSION: Areas of abnormal diffusion and T2 signal in the frontal lobes, occipital lobes, and cerebellum. May reflect seizure effect.  Hypoxic/ischemic injury is a consideration in the appropriate setting. Other etiologies such as infectious/inflammatory are less likely. No proximal intracranial vessel occlusion. Electronically Signed   By: Macy Mis M.D.   On: 10/20/2020 13:18   DG Chest Portable 1 View  Result Date: 10/20/2020 CLINICAL DATA:  Altered mental status EXAM: PORTABLE CHEST 1 VIEW COMPARISON:  Radiograph 09/24/2020, chest CT 11/08/2019 FINDINGS: Unchanged, enlarged cardiac silhouette. There are right upper lobe airspace opacities, appearance possibly in part due to overlying soft tissues. Small bilateral pleural effusions and bibasilar atelectasis. Mild interstitial opacities. No acute osseous abnormality. IMPRESSION: Cardiomegaly with diffuse interstitial opacities, likely pulmonary edema. Right upper lung opacities, which may in part be due to overlying soft tissues, developing infection or alveolar edema cannot be excluded. Small bilateral pleural effusions with adjacent atelectasis. Electronically Signed   By: Maurine Simmering   On: 10/20/2020 07:43    Radiological Exams on Admission: CT ABDOMEN PELVIS WO CONTRAST  Result Date: 10/20/2020 CLINICAL DATA:  Found  unresponsive, recent CPR EXAM: CT ABDOMEN AND PELVIS WITHOUT CONTRAST TECHNIQUE: Multidetector CT imaging of the abdomen and pelvis was performed following the standard protocol without IV contrast. COMPARISON:  06/25/2020 FINDINGS: Lower chest: Bilateral pleural effusions are noted right greater than left increased from the prior exam. Small pericardial effusion is noted. Hepatobiliary: No focal liver abnormality is seen. No gallstones, gallbladder wall thickening, or biliary dilatation. Pancreas: Unremarkable. No pancreatic ductal dilatation or surrounding inflammatory changes. Spleen: Normal in size without focal abnormality. Adrenals/Urinary Tract: Adrenal glands are within normal limits bilaterally. Kidneys are well visualized bilaterally without renal calculi or obstructive changes. Bladder is well distended. Stomach/Bowel: Appendix is not well visualized. No obstructive or inflammatory changes of the colon are seen. Small bowel and stomach appear within normal limits. Vascular/Lymphatic: No significant vascular findings are present. No enlarged abdominal or pelvic lymph nodes. Reproductive: Uterus and bilateral adnexa are unremarkable. Other: No abdominal wall hernia or abnormality. No abdominopelvic ascites. Musculoskeletal: No acute bony abnormality is noted. Generalized soft tissue edema is noted consistent with a degree of anasarca. IMPRESSION: Bilateral effusions right greater than left increased from the prior exam. Changes consistent with anasarca. No other focal abnormality is seen. Electronically Signed   By: Inez Catalina M.D.   On: 10/20/2020 08:10   CT HEAD WO CONTRAST  Result Date: 10/20/2020 CLINICAL DATA:  Found unresponsive, history of recent CPR EXAM: CT HEAD WITHOUT CONTRAST TECHNIQUE: Contiguous axial images were obtained from the base of the skull through the vertex without intravenous contrast. COMPARISON:  05/10/2020 FINDINGS: Brain: No evidence of acute infarction, hemorrhage,  hydrocephalus, extra-axial collection or mass lesion/mass effect. Vascular: No hyperdense vessel or unexpected calcification. Skull: Normal. Negative for fracture or focal lesion. Sinuses/Orbits: No acute finding. Other: None. IMPRESSION: No acute abnormality noted. Electronically Signed   By: Inez Catalina M.D.   On: 10/20/2020 08:07   MR ANGIO HEAD WO CONTRAST  Result Date: 10/20/2020 CLINICAL DATA:  Seizure EXAM: MRI HEAD WITHOUT CONTRAST MRA HEAD WITHOUT CONTRAST TECHNIQUE: Multiplanar, multi-echo pulse sequences of the brain and surrounding structures were acquired without intravenous contrast. Angiographic images of the Circle of Willis were acquired using MRA technique without intravenous contrast. COMPARISON: No pertinent prior exam. COMPARISON:  No pertinent prior exam. FINDINGS: MRI HEAD FINDINGS Motion artifact is present. Brain: There is cortical diffusion hyperintensity with corresponding T2 hyperintensity in the frontal lobes bilaterally involving a small area of adjoining middle and precentral gyri. Foci of cortical T2 hyperintensity are noted in the  occipital lobes bilaterally. There is patchy T2 hyperintensity in the cerebellum bilaterally. No evidence of intracranial hemorrhage. No intracranial mass or mass effect. Patchy foci of T2 hyperintensity in the supratentorial and pontine white matter nonspecific but may reflect mild chronic microvascular ischemic changes. Vascular: Major vessel flow voids at the skull base are preserved. Skull and upper cervical spine: Marrow signal is grossly within normal limits. Sinuses/Orbits: Trace paranasal sinus mucosal thickening. Orbits are unremarkable. Other: Mastoid air cells are clear.  Sella is unremarkable. MRA HEAD FINDINGS Motion artifact is present. Anterior circulation: Intracranial internal carotid arteries are patent. Proximal anterior and middle cerebral arteries are patent. Posterior circulation: Intracranial vertebral arteries are patent.  Basilar artery is patent. Proximal posterior cerebral arteries are patent. IMPRESSION: Areas of abnormal diffusion and T2 signal in the frontal lobes, occipital lobes, and cerebellum. May reflect seizure effect. Hypoxic/ischemic injury is a consideration in the appropriate setting. Other etiologies such as infectious/inflammatory are less likely. No proximal intracranial vessel occlusion. Electronically Signed   By: Macy Mis M.D.   On: 10/20/2020 13:18   MR BRAIN WO CONTRAST  Result Date: 10/20/2020 CLINICAL DATA:  Seizure EXAM: MRI HEAD WITHOUT CONTRAST MRA HEAD WITHOUT CONTRAST TECHNIQUE: Multiplanar, multi-echo pulse sequences of the brain and surrounding structures were acquired without intravenous contrast. Angiographic images of the Circle of Willis were acquired using MRA technique without intravenous contrast. COMPARISON: No pertinent prior exam. COMPARISON:  No pertinent prior exam. FINDINGS: MRI HEAD FINDINGS Motion artifact is present. Brain: There is cortical diffusion hyperintensity with corresponding T2 hyperintensity in the frontal lobes bilaterally involving a small area of adjoining middle and precentral gyri. Foci of cortical T2 hyperintensity are noted in the occipital lobes bilaterally. There is patchy T2 hyperintensity in the cerebellum bilaterally. No evidence of intracranial hemorrhage. No intracranial mass or mass effect. Patchy foci of T2 hyperintensity in the supratentorial and pontine white matter nonspecific but may reflect mild chronic microvascular ischemic changes. Vascular: Major vessel flow voids at the skull base are preserved. Skull and upper cervical spine: Marrow signal is grossly within normal limits. Sinuses/Orbits: Trace paranasal sinus mucosal thickening. Orbits are unremarkable. Other: Mastoid air cells are clear.  Sella is unremarkable. MRA HEAD FINDINGS Motion artifact is present. Anterior circulation: Intracranial internal carotid arteries are patent. Proximal  anterior and middle cerebral arteries are patent. Posterior circulation: Intracranial vertebral arteries are patent. Basilar artery is patent. Proximal posterior cerebral arteries are patent. IMPRESSION: Areas of abnormal diffusion and T2 signal in the frontal lobes, occipital lobes, and cerebellum. May reflect seizure effect. Hypoxic/ischemic injury is a consideration in the appropriate setting. Other etiologies such as infectious/inflammatory are less likely. No proximal intracranial vessel occlusion. Electronically Signed   By: Macy Mis M.D.   On: 10/20/2020 13:18   DG Chest Portable 1 View  Result Date: 10/20/2020 CLINICAL DATA:  Altered mental status EXAM: PORTABLE CHEST 1 VIEW COMPARISON:  Radiograph 09/24/2020, chest CT 11/08/2019 FINDINGS: Unchanged, enlarged cardiac silhouette. There are right upper lobe airspace opacities, appearance possibly in part due to overlying soft tissues. Small bilateral pleural effusions and bibasilar atelectasis. Mild interstitial opacities. No acute osseous abnormality. IMPRESSION: Cardiomegaly with diffuse interstitial opacities, likely pulmonary edema. Right upper lung opacities, which may in part be due to overlying soft tissues, developing infection or alveolar edema cannot be excluded. Small bilateral pleural effusions with adjacent atelectasis. Electronically Signed   By: Maurine Simmering   On: 10/20/2020 07:43    DVT Prophylaxis -heparin AM Labs Ordered, also please review  Full Orders  Family Communication: Admission, patients condition and plan of care including tests being ordered have been discussed with the patient who indicate understanding and agree with the plan   Code Status - Full Code  Likely DC to  home   Condition   stable   Roxan Hockey M.D on 10/20/2020 at 5:08 PM Go to [email or web address] -  for contact info  [facility] - Office  [phone]

## 2020-10-20 NOTE — Op Note (Signed)
Patient:  Sheila Austin  DOB:  26-Jul-1985  MRN:  [id number]   Preop Diagnosis: Sepsis, need for central venous access  Postop Diagnosis: Same  Procedure: Central line placement  Surgeon: Aviva Signs, MD  Anes: Local  Indications: Patient is a 35 year old white female who was admitted to the intensive care unit with seizures and sepsis.  There was a lack of IV access.  She also has renal insufficiency.  I have been asked to place an emergent central line due to loss of IV access.  Consent could not be obtained from the patient due to their somnolent state and this is an emergency to give medications.  Procedure note: The patient was in bed ICU 5.  The procedure was performed at bedside.  Surgical site confirmation was performed.  1% Xylocaine was used for local anesthesia.  The right groin was prepped and draped using usual sterile technique with ChloraPrep.  Full sterile technique was used.  The needle was advanced into the right femoral vein using the Seldinger technique without difficulty.  A guidewire was then advanced.  An introducer was placed over the guidewire.  A triple-lumen catheter was then placed over the guidewire and the guidewire was removed.  Good backflow of venous blood was noted on aspiration of all 3 ports.  All 3 ports were flushed with saline.  The catheter was secured in place using a 3-0 silk suture x2.  A dressing was then applied.  The patient tolerated the procedure well.  Complications: None  EBL: Minimal  Specimen: None

## 2020-10-20 NOTE — ED Notes (Signed)
Prior to compazine administration. Patient vomited on sheet and self despite having emesis bag. Patient cleaned and provided with new sheet.

## 2020-10-20 NOTE — ED Notes (Signed)
Attempting 2nd IV at this time; unable to obtain. Beth, RN ay bedside to obtain second IV access

## 2020-10-20 NOTE — ED Triage Notes (Signed)
[facility] - pt found unconscious, family started CPR. Per EMS pt was not pulseless. CBG 300 in route. Pt alert, EDP at bedside

## 2020-10-21 ENCOUNTER — Encounter (HOSPITAL_COMMUNITY): Payer: Self-pay | Admitting: Family Medicine

## 2020-10-21 DIAGNOSIS — R569 Unspecified convulsions: Secondary | ICD-10-CM | POA: Diagnosis not present

## 2020-10-21 DIAGNOSIS — R6510 Systemic inflammatory response syndrome (SIRS) of non-infectious origin without acute organ dysfunction: Secondary | ICD-10-CM | POA: Diagnosis not present

## 2020-10-21 LAB — CBC
HCT: 24 % — ABNORMAL LOW (ref 36.0–46.0)
Hemoglobin: 7.7 g/dL — ABNORMAL LOW (ref 12.0–15.0)
MCH: 29.4 pg (ref 26.0–34.0)
MCHC: 32.1 g/dL (ref 30.0–36.0)
MCV: 91.6 fL (ref 80.0–100.0)
Platelets: 220 10*3/uL (ref 150–400)
RBC: 2.62 MIL/uL — ABNORMAL LOW (ref 3.87–5.11)
RDW: 15.6 % — ABNORMAL HIGH (ref 11.5–15.5)
WBC: 14 10*3/uL — ABNORMAL HIGH (ref 4.0–10.5)
nRBC: 0 % (ref 0.0–0.2)

## 2020-10-21 LAB — BASIC METABOLIC PANEL
Anion gap: 8 (ref 5–15)
BUN: 48 mg/dL — ABNORMAL HIGH (ref 6–20)
CO2: 20 mmol/L — ABNORMAL LOW (ref 22–32)
Calcium: 7.1 mg/dL — ABNORMAL LOW (ref 8.9–10.3)
Chloride: 109 mmol/L (ref 98–111)
Creatinine, Ser: 4.87 mg/dL — ABNORMAL HIGH (ref 0.44–1.00)
GFR, Estimated: 11 mL/min — ABNORMAL LOW (ref >60)
Glucose, Bld: 119 mg/dL — ABNORMAL HIGH (ref 70–99)
Potassium: 4.7 mmol/L (ref 3.5–5.1)
Sodium: 137 mmol/L (ref 135–145)

## 2020-10-21 LAB — MRSA NEXT GEN BY PCR, NASAL: MRSA by PCR Next Gen: DETECTED — AB

## 2020-10-21 LAB — GLUCOSE, CAPILLARY
Glucose-Capillary: 103 mg/dL — ABNORMAL HIGH (ref 70–99)
Glucose-Capillary: 108 mg/dL — ABNORMAL HIGH (ref 70–99)
Glucose-Capillary: 132 mg/dL — ABNORMAL HIGH (ref 70–99)
Glucose-Capillary: 106 mg/dL — ABNORMAL HIGH (ref 70–99)

## 2020-10-21 MED ORDER — DARBEPOETIN ALFA 100 MCG/0.5ML IJ SOSY
100.0000 ug | PREFILLED_SYRINGE | Freq: Once | INTRAMUSCULAR | Status: AC
  Administered 2020-10-21: 100 ug via SUBCUTANEOUS
  Filled 2020-10-21: qty 0.5

## 2020-10-21 MED ORDER — PNEUMOCOCCAL VAC POLYVALENT 25 MCG/0.5ML IJ INJ: 0.5000 mL | INJECTION | INTRAMUSCULAR | Status: DC

## 2020-10-21 MED ORDER — MUPIROCIN 2 % EX OINT
TOPICAL_OINTMENT | Freq: Two times a day (BID) | CUTANEOUS | Status: DC
  Administered 2020-10-23: 1 via NASAL
  Filled 2020-10-21: qty 22

## 2020-10-21 MED ORDER — SODIUM CHLORIDE 0.9 % IV SOLN: INTRAVENOUS | Status: DC

## 2020-10-21 MED ORDER — CLONIDINE HCL 0.1 MG/24HR TD PTWK
0.1000 mg | MEDICATED_PATCH | TRANSDERMAL | Status: DC
  Administered 2020-10-21: 0.1 mg via TRANSDERMAL
  Filled 2020-10-21 (×2): qty 1

## 2020-10-21 NOTE — Procedures (Addendum)
Patient Name: Sheila Austin  MRN: [id number]  Epilepsy Attending: Lora Havens  Referring Physician/Provider: Dr Roxan Hockey Date: 10/20/2020 Duration: 23.12 mins  Patient history: 35 year old female with altered mental status with concern for possible seizures.  EEG to evaluate for seizures.  Level of alertness: Awake, asleep  AEDs during EEG study: LEV  Technical aspects: This EEG study was done with scalp electrodes positioned according to the 10-20 International system of electrode placement. Electrical activity was acquired at a sampling rate of '500Hz'$  and reviewed with a high frequency filter of '70Hz'$  and a low frequency filter of '1Hz'$ . EEG data were recorded continuously and digitally stored.   Description: The posterior dominant rhythm consists of 8-9 Hz activity of moderate voltage (25-35 uV) seen predominantly in posterior head regions, symmetric and reactive to eye opening and eye closing. Sleep was characterized by vertex waves, sleep spindles (12 to 14 Hz), maximal frontocentral region. Hyperventilation and photic stimulation were not performed.     IMPRESSION: This study is within normal limits. No seizures or epileptiform discharges were seen throughout the recording.  [name] [name]

## 2020-10-21 NOTE — Consult Note (Signed)
Ashippun A. Merlene Laughter, MD     [email or web address]          Sheila Austin is an 35 y.o. female.   ASSESSMENT/PLAN: Resolving toxic metabolic encephalopathy Multiple episodes of the likely acute dystonia due to metoclopramide and Zofran. Epileptic seizures thought to be unlikely but it is reasonable to continue with seizure medications for now and DC/ Wean after in a month or so. MRI finding seems most consistent with ischemia/anoxia.      The patient is a 35 year old right-handed white female who presents with multiple episodes of spontaneous turning her head to the right. She has had significant nausea vomiting over last few days and was taking Zofran. She overall was not feeling well however and became progressively unresponsive. She has was admitted to the hospital for workup and was noted to have extensive on multiple metabolic derangements. It appeared that the patient was found unresponsive at home before then and CPR was administered. No history of seizures noted. The patient does not report having any oral trauma. She however became unresponsive to what was going on after she became progressively weak and not feeling well. However, during the episodes of episodic turning of her head towards right, she does not report having alteration of consciousness. She was given metoclopramide in the emergency room for nausea vomiting and immediately had episode of shaking and head turning which the hospitalist thought was due to acute dystonia. She was given Benadryl and Ativan. She has had no recurrent episodes. She has been somewhat drowsy after the medications given but has improved. 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  She apparently ambulates by kneeling and crawling around the floor. She is not a candidate for prosthesis. She does have a walker that she uses at times. The review  systems otherwise unrevealing.   GENERAL:  This is a very pleasant morbidly obese female who is sleeping but easily arousable.  HEENT:  Large neck.  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   BACK: Normal  SKIN: Normal by inspection.    MENTAL STATUS:  She lays in bed with eyes closed but easily arousable to verbal commands. She is lucid, conversational and coherent.  CRANIAL NERVES: Pupils are equal, round and reactive to light and accomodation; extra ocular movements are full, there is no significant nystagmus; visual fields are full; upper and lower facial muscles are normal in strength and symmetric, there is no flattening of the nasolabial folds; tongue is midline; uvula is midline; shoulder elevation is normal.  MOTOR: Normal tone, bulk and strength; no pronator drift.  COORDINATION: Left finger to nose is normal, right finger to nose is normal, No rest tremor; no intention tremor; no postural tremor; no bradykinesia.  REFLEXES: Deep tendon reflexes are symmetrical and normal but severely diminished in the legs.   SENSATION: Normal to light touch, temperature, and pain.     Blood pressure (!) 189/99, pulse 96, temperature 98.4 F (36.9 C), temperature source Oral, resp. rate (!) 25, height '5\' 2"'$  (1.575 m), weight (!) 150.1 kg, SpO2 100 %.  Past Medical History:  Diagnosis Date   Acute exacerbation of CHF (congestive heart failure) ([place]) 10/20/2020   Diabetes mellitus    uncontrolled, last A1c was 14   E. coli sepsis [facility])    Ectopic pregnancy    Essential hypertension, benign 11/01/2008   Fatty liver    Gastroparesis  GERD (gastroesophageal reflux disease)    Helicobacter pylori gastritis 09/07/2011   Migraine    Morbid obesity with body mass index (BMI) of 40.0 to 49.9 [facility])    Pulmonary edema    Pyelonephritis    Ureteral obstruction     Past Surgical History:  Procedure Laterality Date   AMPUTATION Right 02/04/2019    Procedure: RIGHT BELOW KNEE AMPUTATION;  Surgeon: Newt Minion, MD;  Location: [facility];  Service: Orthopedics;  Laterality: Right;   COLONOSCOPY  2015   TI normal, colon appeared normal. Multiple biopsies benign.    ESOPHAGOGASTRODUODENOSCOPY  2010   Normal   WOUND DEBRIDEMENT Right 01/03/2019   Procedure: DEBRIDEMENT WOUND RIGHT LATERAL FOOT;  Surgeon: Angelia Mould, MD;  Location: [facility] OR;  Service: Vascular;  Laterality: Right;    Family History  Problem Relation Age of Onset   Arthritis Mother    Asthma Mother    COPD Mother    Depression Mother    Diabetes Mother    Hypertension Mother    Mental illness Mother    Kidney disease Mother    Crohn's disease Mother    Colon polyps Mother        in her 10s. Unknown if adenomas     Aneurysm Father 42       brain   Mental illness Sister    Crohn's disease Sister    Arthritis Maternal Aunt    Asthma Maternal Aunt    COPD Maternal Aunt    Depression Maternal Aunt    Diabetes Maternal Aunt    Hypertension Maternal Aunt    Mental illness Maternal Aunt    Stroke Maternal Aunt    Heart failure Maternal Aunt    Heart failure Maternal Grandfather    Cancer Maternal Grandfather    Diabetes Maternal Grandfather    Heart disease Maternal Grandfather    Hypertension Maternal Grandfather    Hyperlipidemia Maternal Grandfather    Mental illness Brother    Diabetes Maternal Grandmother    Heart disease Maternal Grandmother    Hypertension Maternal Grandmother    Hyperlipidemia Maternal Grandmother    Diabetes Paternal Grandmother    Heart disease Paternal Grandmother    Diabetes Paternal Grandfather    Cancer Paternal Grandfather    Colon cancer Neg Hx     Social History:  reports that she has never smoked. She has never used smokeless tobacco. She reports previous alcohol use. She reports that she does not use drugs.  Allergies:  Allergies  Allergen Reactions   Bee Venom Anaphylaxis, Hives, Shortness Of Breath and  Swelling   Penicillins Anaphylaxis and Itching    Has patient had a PCN reaction causing immediate rash, facial/tongue/throat swelling, SOB or lightheadedness with hypotension: yes Has patient had a PCN reaction causing severe rash involving mucus membranes or skin necrosis: unknown Has patient had a PCN reaction that required hospitalization : yes Has patient had a PCN reaction occurring within the last 10 years: no If all of the above answers are "NO", then may proceed with Cephalosporin use.    Lorazepam     Other reaction(s): Hallucinations   Lovenox [Enoxaparin Sodium] Other (See Comments)    Hyperkalemia? GI bleed   Metoclopramide Other (See Comments)    Dystonia following metoclopramide injection    Medications: Prior to Admission medications   Medication Sig Start Date End Date Taking? Authorizing Provider  amLODipine (NORVASC) 10 MG tablet Take 1 tablet (10 mg total) by mouth daily. 09/27/20  09/27/21 Yes Emokpae, Courage, MD  bumetanide (BUMEX) 1 MG tablet Take 1 mg by mouth in the morning, at noon, in the evening, and at bedtime.   Yes [provider]  carvedilol (COREG) 12.5 MG tablet Take 1 tablet (12.5 mg total) by mouth 2 (two) times daily. 09/27/20  Yes Roxan Hockey, MD  cloNIDine (CATAPRES - DOSED IN MG/24 HR) 0.1 mg/24hr patch Place 1 patch (0.1 mg total) onto the skin every Sunday. Once weekly 10/02/20  Yes Emokpae, Courage, MD  CVS VITAMIN C 1000 MG tablet Take 1,000 mg by mouth 2 (two) times daily. 10/29/19  Yes [provider]  CVS ZINC GLUCONATE 50 MG tablet Take 50 mg by mouth daily. 10/29/19  Yes [provider]  ferrous sulfate 325 (65 FE) MG tablet Take 1 tablet (325 mg total) by mouth daily with breakfast. 09/27/20  Yes Emokpae, Courage, MD  hydrALAZINE (APRESOLINE) 100 MG tablet Take 1 tablet (100 mg total) by mouth 3 (three) times daily. 09/27/20  Yes Emokpae, Courage, MD  insulin aspart (NOVOLOG) 100 UNIT/ML FlexPen Inject 3 Units into  the skin 3 (three) times daily with meals. 09/27/20  Yes Roxan Hockey, MD  isosorbide mononitrate (IMDUR) 30 MG 24 hr tablet Take 1 tablet (30 mg total) by mouth daily. 09/28/20  Yes Emokpae, Courage, MD  LANTUS 100 UNIT/ML injection Inject 0.18 mLs (18 Units total) into the skin at bedtime. 09/27/20  Yes Emokpae, Courage, MD  metolazone (ZAROXOLYN) 2.5 MG tablet Take 2.5 mg by mouth daily. 07/11/20  Yes [provider]  PROAIR HFA 108 (90 Base) MCG/ACT inhaler Inhale 2 puffs into the lungs every 6 (six) hours as needed for wheezing or shortness of breath. 05/20/20  Yes [provider]  promethazine (PHENERGAN) 25 MG tablet Take 1 tablet (25 mg total) by mouth every 6 (six) hours as needed for nausea. 09/27/20  Yes Emokpae, Courage, MD  sodium bicarbonate 650 MG tablet Take 1 tablet (650 mg total) by mouth 2 (two) times daily. 09/27/20 09/27/21 Yes Emokpae, Courage, MD  tamsulosin (FLOMAX) 0.4 MG CAPS capsule Take 1 capsule (0.4 mg total) by mouth daily after supper. For Bladder 09/27/20  Yes Emokpae, Courage, MD  Vitamin D, Ergocalciferol, (DRISDOL) 1.25 MG (50000 UNIT) CAPS capsule Take 1 capsule by mouth once a week. 07/11/20  Yes [provider]    Scheduled Meds:  amLODipine  10 mg Oral Daily   ascorbic acid  1,000 mg Oral BID   carvedilol  12.5 mg Oral BID   Chlorhexidine Gluconate Cloth  6 each Topical Daily   [START ON 10/23/2020] cloNIDine  0.1 mg Transdermal Q Sun   ferrous sulfate  325 mg Oral Q breakfast   heparin  5,000 Units Subcutaneous Q8H   hydrALAZINE  100 mg Oral TID   insulin aspart  0-15 Units Subcutaneous TID WC   insulin aspart  0-5 Units Subcutaneous QHS   insulin glargine  8 Units Subcutaneous QHS   isosorbide mononitrate  30 mg Oral Daily   LORazepam  1 mg Intravenous Once   sodium bicarbonate  650 mg Oral BID   sodium chloride flush  10-40 mL Intracatheter Q12H   sodium chloride flush  3 mL Intravenous Q12H   sodium chloride flush  3 mL  Intravenous Q12H   tamsulosin  0.4 mg Oral QPC supper   Vitamin D (Ergocalciferol)  50,000 Units Oral Weekly   zinc sulfate  220 mg Oral Daily   Continuous Infusions:  sodium chloride 250  mL (10/20/20 1550)   sodium chloride 150 mL/hr at 10/21/20 0559   cefTRIAXone (ROCEPHIN)  IV 2 g (10/20/20 1813)   levETIRAcetam 500 mg (10/20/20 2010)   metronidazole 500 mg (10/21/20 0206)   PRN Meds:.sodium chloride, acetaminophen **OR** acetaminophen, albuterol, bisacodyl, labetalol, LORazepam, ondansetron **OR** ondansetron (ZOFRAN) IV, polyethylene glycol, promethazine, sodium chloride flush, sodium chloride flush, traZODone     Results for orders placed or performed during the hospital encounter of 10/20/20 (from the past 48 hour(s))  Urinalysis, Complete w Microscopic Urine, Catheterized     Status: Abnormal   Collection Time: 10/20/20  6:53 AM  Result Value Ref Range   Color, Urine YELLOW YELLOW   APPearance CLEAR CLEAR   Specific Gravity, Urine 1.016 1.005 - 1.030   pH 8.0 5.0 - 8.0   Glucose, UA >=500 (A) NEGATIVE mg/dL   Hgb urine dipstick NEGATIVE NEGATIVE   Bilirubin Urine NEGATIVE NEGATIVE   Ketones, ur 5 (A) NEGATIVE mg/dL   Protein, ur >=300 (A) NEGATIVE mg/dL   Nitrite NEGATIVE NEGATIVE   Leukocytes,Ua NEGATIVE NEGATIVE   RBC / HPF 6-10 0 - 5 RBC/hpf   WBC, UA 0-5 0 - 5 WBC/hpf   Bacteria, UA NONE SEEN NONE SEEN   Squamous Epithelial / LPF 0-5 0 - 5   Mucus PRESENT     Comment: Performed at [facility], [address], [place], [place] [address]  Urine rapid drug screen (hosp performed)     Status: None   Collection Time: 10/20/20  6:54 AM  Result Value Ref Range   Opiates NONE DETECTED NONE DETECTED   Cocaine NONE DETECTED NONE DETECTED   Benzodiazepines NONE DETECTED NONE DETECTED   Amphetamines NONE DETECTED NONE DETECTED   Tetrahydrocannabinol NONE DETECTED NONE DETECTED   Barbiturates NONE DETECTED NONE DETECTED    Comment: (NOTE) DRUG SCREEN FOR MEDICAL  PURPOSES ONLY.  IF CONFIRMATION IS NEEDED FOR ANY PURPOSE, NOTIFY LAB WITHIN 5 DAYS.  LOWEST DETECTABLE LIMITS FOR URINE DRUG SCREEN Drug Class                     Cutoff (ng/mL) Amphetamine and metabolites    1000 Barbiturate and metabolites    200 Benzodiazepine                 [id number] Tricyclics and metabolites     300 Opiates and metabolites        300 Cocaine and metabolites        300 THC                            50 Performed at [facility], [address] [place],  [address]   CBG monitoring, ED     Status: Abnormal   Collection Time: 10/20/20  6:55 AM  Result Value Ref Range   Glucose-Capillary 277 (H) 70 - 99 mg/dL    Comment: Glucose reference range applies only to samples taken after fasting for at least 8 hours.  Blood Cultures (routine x 2)     Status: None (Preliminary result)   Collection Time: 10/20/20  7:01 AM   Specimen: BLOOD  Result Value Ref Range   Specimen Description BLOOD BLOOD LEFT WRIST    Special Requests      Blood Culture adequate volume BOTTLES DRAWN AEROBIC AND ANAEROBIC   Culture      NO GROWTH < 24 HOURS Performed at [facility], [address], [place],  [place] [address]    Report Status PENDING   Lactic acid, plasma     Status: Abnormal   Collection Time: 10/20/20  7:02 AM  Result Value Ref Range   Lactic Acid, Venous 5.5 (HH) 0.5 - 1.9 mmol/L    Comment: CRITICAL RESULT CALLED TO, READ BACK BY AND VERIFIED WITH: EASTER,T AT 7:30AM ON 10/20/20 BY [facility] Performed at [facility], [address], [place], [place] [address]   Blood Cultures (routine x 2)     Status: None (Preliminary result)   Collection Time: 10/20/20  7:02 AM   Specimen: BLOOD  Result Value Ref Range   Specimen Description BLOOD BLOOD RIGHT WRIST    Special Requests      Blood Culture adequate volume BOTTLES DRAWN AEROBIC AND ANAEROBIC   Culture      NO GROWTH < 24 HOURS Performed at [facility], [address], [place], [place] [address]    Report  Status PENDING   Ethanol     Status: None   Collection Time: 10/20/20  7:03 AM  Result Value Ref Range   Alcohol, Ethyl (B) <10 <10 mg/dL    Comment: (NOTE) Lowest detectable limit for serum alcohol is 10 mg/dL.  For medical purposes only. Performed at [facility], [address], [place], [place] [address]   Ammonia     Status: None   Collection Time: 10/20/20  7:03 AM  Result Value Ref Range   Ammonia 25 9 - 35 umol/L    Comment: Performed at [facility], [address] [place], [place] [address]  Brain natriuretic peptide     Status: Abnormal   Collection Time: 10/20/20  7:03 AM  Result Value Ref Range   B Natriuretic Peptide 1,253.0 (H) 0.0 - 100.0 pg/mL    Comment: Performed at [facility], [address] [place], [place] [address]  hCG, quantitative, pregnancy     Status: None   Collection Time: 10/20/20  7:03 AM  Result Value Ref Range   hCG, Beta Chain, Quant, S 3 <5 mIU/mL    Comment:          GEST. AGE      CONC.  (mIU/mL)   <=1 WEEK        5 - 50     2 WEEKS       50 - 500     3 WEEKS       100 - 10,000     4 WEEKS     1,000 - 30,000     5 WEEKS     3,500 - 115,000   6-8 WEEKS     12,000 - 270,000    12 WEEKS     15,000 - 220,000        FEMALE AND NON-PREGNANT FEMALE:     LESS THAN 5 mIU/mL Performed at [facility], [address], [place], [place] [address]   Lipase, blood     Status: None   Collection Time: 10/20/20  7:03 AM  Result Value Ref Range   Lipase 37 11 - 51 U/L    Comment: Performed at [facility], [address], [place], [place] [address]  Comprehensive metabolic panel     Status: Abnormal   Collection Time: 10/20/20  7:04 AM  Result Value Ref Range   Sodium 138 135 - 145 mmol/L   Potassium 5.1 3.5 - 5.1 mmol/L   Chloride 110 98 - 111 mmol/L   CO2 15 (L) 22 - 32 mmol/L  Glucose, Bld 286 (H) 70 - 99 mg/dL    Comment: Glucose reference range applies only to samples taken after fasting for at least 8 hours.   BUN 47 (H) 6 - 20 mg/dL    Creatinine, Ser 4.79 (H) 0.44 - 1.00 mg/dL   Calcium 7.7 (L) 8.9 - 10.3 mg/dL   Total Protein 6.3 (L) 6.5 - 8.1 g/dL   Albumin 2.4 (L) 3.5 - 5.0 g/dL   AST 22 15 - 41 U/L   ALT 11 0 - 44 U/L   Alkaline Phosphatase 124 38 - 126 U/L   Total Bilirubin 0.7 0.3 - 1.2 mg/dL   GFR, Estimated 12 (L) >60 mL/min    Comment: (NOTE) Calculated using the CKD-EPI Creatinine Equation (2021)    Anion gap 13 5 - 15    Comment: Performed at [facility], [address] [place], [place] [address]  CBC WITH DIFFERENTIAL     Status: Abnormal   Collection Time: 10/20/20  7:04 AM  Result Value Ref Range   WBC 19.6 (H) 4.0 - 10.5 K/uL   RBC 3.61 (L) 3.87 - 5.11 MIL/uL   Hemoglobin 10.3 (L) 12.0 - 15.0 g/dL   HCT 32.4 (L) 36.0 - 46.0 %   MCV 89.8 80.0 - 100.0 fL   MCH 28.5 26.0 - 34.0 pg   MCHC 31.8 30.0 - 36.0 g/dL   RDW 15.2 11.5 - 15.5 %   Platelets 346 150 - 400 K/uL   nRBC 0.0 0.0 - 0.2 %   Neutrophils Relative % 94 %   Neutro Abs 18.3 (H) 1.7 - 7.7 K/uL   Lymphocytes Relative 3 %   Lymphs Abs 0.6 (L) 0.7 - 4.0 K/uL   Monocytes Relative 2 %   Monocytes Absolute 0.5 0.1 - 1.0 K/uL   Eosinophils Relative 0 %   Eosinophils Absolute 0.0 0.0 - 0.5 K/uL   Basophils Relative 0 %   Basophils Absolute 0.1 0.0 - 0.1 K/uL   Immature Granulocytes 1 %   Abs Immature Granulocytes 0.17 (H) 0.00 - 0.07 K/uL    Comment: Performed at [facility], [address] [place], [place] [address]  Protime-INR     Status: None   Collection Time: 10/20/20  7:04 AM  Result Value Ref Range   Prothrombin Time 14.8 11.4 - 15.2 seconds   INR 1.2 0.8 - 1.2    Comment: (NOTE) INR goal varies based on device and disease states. Performed at [facility], [address] [place], [place] [address]   Troponin I (High Sensitivity)     Status: Abnormal   Collection Time: 10/20/20  7:04 AM  Result Value Ref Range   Troponin I (High Sensitivity) 20 (H) <18 ng/L    Comment: (NOTE) Elevated high sensitivity troponin I (hsTnI)  values and significant  changes across serial measurements may suggest ACS but many other  chronic and acute conditions are known to elevate hsTnI results.  Refer to the "Links" section for chest pain algorithms and additional  guidance. Performed at [facility], [address] [place], [place] [address]   Resp Panel by RT-PCR (Flu A&B, Covid) Nasopharyngeal Swab     Status: None   Collection Time: 10/20/20  7:10 AM   Specimen: Nasopharyngeal Swab; Nasopharyngeal(NP) swabs in vial transport medium  Result Value Ref Range   SARS Coronavirus 2 by RT PCR NEGATIVE NEGATIVE    Comment: (NOTE) SARS-CoV-2 target nucleic acids are NOT DETECTED.  The SARS-CoV-2 RNA is generally detectable in  upper respiratory specimens during the acute phase of infection. The lowest concentration of SARS-CoV-2 viral copies this assay can detect is 138 copies/mL. A negative result does not preclude SARS-Cov-2 infection and should not be used as the sole basis for treatment or other patient management decisions. A negative result may occur with  improper specimen collection/handling, submission of specimen other than nasopharyngeal swab, presence of viral mutation(s) within the areas targeted by this assay, and inadequate number of viral copies(<138 copies/mL). A negative result must be combined with clinical observations, patient history, and epidemiological information. The expected result is Negative.  Fact Sheet for Patients:  EntrepreneurPulse.com.au  Fact Sheet for Healthcare Providers:  IncredibleEmployment.be  This test is no t yet approved or cleared by the [place] FDA and  has been authorized for detection and/or diagnosis of SARS-CoV-2 by FDA under an Emergency Use Authorization (EUA). This EUA will remain  in effect (meaning this test can be used) for the duration of the COVID-19 declaration under Section 564(b)(1) of the Act, 21 U.S.C.section  360bbb-3(b)(1), unless the authorization is terminated  or revoked sooner.       Influenza A by PCR NEGATIVE NEGATIVE   Influenza B by PCR NEGATIVE NEGATIVE    Comment: (NOTE) The Xpert Xpress SARS-CoV-2/FLU/RSV plus assay is intended as an aid in the diagnosis of influenza from Nasopharyngeal swab specimens and should not be used as a sole basis for treatment. Nasal washings and aspirates are unacceptable for Xpert Xpress SARS-CoV-2/FLU/RSV testing.  Fact Sheet for Patients: EntrepreneurPulse.com.au  Fact Sheet for Healthcare Providers: IncredibleEmployment.be  This test is not yet approved or cleared by the [place] FDA and has been authorized for detection and/or diagnosis of SARS-CoV-2 by FDA under an Emergency Use Authorization (EUA). This EUA will remain in effect (meaning this test can be used) for the duration of the COVID-19 declaration under Section 564(b)(1) of the Act, 21 U.S.C. section 360bbb-3(b)(1), unless the authorization is terminated or revoked.  Performed at [facility], [address] [place], [place] [address]   Lactic acid, plasma     Status: Abnormal   Collection Time: 10/20/20  9:01 AM  Result Value Ref Range   Lactic Acid, Venous 2.7 (HH) 0.5 - 1.9 mmol/L    Comment: CRITICAL VALUE NOTED.  VALUE IS CONSISTENT WITH PREVIOUSLY REPORTED AND CALLED VALUE. Performed at [facility], [address], [place], [place] [address]   Troponin I (High Sensitivity)     Status: Abnormal   Collection Time: 10/20/20  9:01 AM  Result Value Ref Range   Troponin I (High Sensitivity) 27 (H) <18 ng/L    Comment: (NOTE) Elevated high sensitivity troponin I (hsTnI) values and significant  changes across serial measurements may suggest ACS but many other  chronic and acute conditions are known to elevate hsTnI results.  Refer to the "Links" section for chest pain algorithms and additional  guidance. Performed at [facility], [address] [place], [place] [address]   Blood gas, venous     Status: Abnormal   Collection Time: 10/20/20  9:09 AM  Result Value Ref Range   FIO2 28.00    pH, Ven 7.293 7.250 - 7.430   pCO2, Ven 37.2 (L) 44.0 - 60.0 mmHg   pO2, Ven 48.4 (H) 32.0 - 45.0 mmHg   Bicarbonate 17.8 (L) 20.0 - 28.0 mmol/L   Acid-base deficit 7.8 (H) 0.0 - 2.0 mmol/L   O2 Saturation 81.2 %   Patient temperature 37.0  Comment: Performed at [facility], [address] [place], [place] [address]  CBG monitoring, ED     Status: Abnormal   Collection Time: 10/20/20 12:53 PM  Result Value Ref Range   Glucose-Capillary 245 (H) 70 - 99 mg/dL    Comment: Glucose reference range applies only to samples taken after fasting for at least 8 hours.  Glucose, capillary     Status: Abnormal   Collection Time: 10/20/20  4:57 PM  Result Value Ref Range   Glucose-Capillary 213 (H) 70 - 99 mg/dL    Comment: Glucose reference range applies only to samples taken after fasting for at least 8 hours.  Glucose, capillary     Status: Abnormal   Collection Time: 10/20/20  8:16 PM  Result Value Ref Range   Glucose-Capillary 159 (H) 70 - 99 mg/dL    Comment: Glucose reference range applies only to samples taken after fasting for at least 8 hours.   Comment 1 Notify RN    Comment 2 Document in Chart   Basic metabolic panel     Status: Abnormal   Collection Time: 10/21/20  5:21 AM  Result Value Ref Range   Sodium 137 135 - 145 mmol/L   Potassium 4.7 3.5 - 5.1 mmol/L   Chloride 109 98 - 111 mmol/L   CO2 20 (L) 22 - 32 mmol/L   Glucose, Bld 119 (H) 70 - 99 mg/dL    Comment: Glucose reference range applies only to samples taken after fasting for at least 8 hours.   BUN 48 (H) 6 - 20 mg/dL   Creatinine, Ser 4.87 (H) 0.44 - 1.00 mg/dL   Calcium 7.1 (L) 8.9 - 10.3 mg/dL   GFR, Estimated 11 (L) >60 mL/min    Comment: (NOTE) Calculated using the CKD-EPI Creatinine Equation (2021)    Anion gap 8 5 - 15    Comment:  Performed at [facility], [address], [place], [place] [address]  Glucose, capillary     Status: Abnormal   Collection Time: 10/21/20  7:22 AM  Result Value Ref Range   Glucose-Capillary 103 (H) 70 - 99 mg/dL    Comment: Glucose reference range applies only to samples taken after fasting for at least 8 hours.    Studies/Results:     FINDINGS: MRI HEAD FINDINGS   Motion artifact is present.   Brain: There is cortical diffusion hyperintensity with corresponding T2 hyperintensity in the frontal lobes bilaterally involving a small area of adjoining middle and precentral gyri. Foci of cortical T2 hyperintensity are noted in the occipital lobes bilaterally. There is patchy T2 hyperintensity in the cerebellum bilaterally.   No evidence of intracranial hemorrhage. No intracranial mass or mass effect. Patchy foci of T2 hyperintensity in the supratentorial and pontine white matter nonspecific but may reflect mild chronic microvascular ischemic changes.   Vascular: Major vessel flow voids at the skull base are preserved.   Skull and upper cervical spine: Marrow signal is grossly within normal limits.   Sinuses/Orbits: Trace paranasal sinus mucosal thickening. Orbits are unremarkable.   Other: Mastoid air cells are clear.  Sella is unremarkable.   MRA HEAD FINDINGS   Motion artifact is present.   Anterior circulation: Intracranial internal carotid arteries are patent. Proximal anterior and middle cerebral arteries are patent.   Posterior circulation: Intracranial vertebral arteries are patent. Basilar artery is patent. Proximal posterior cerebral arteries are patent.   IMPRESSION: Areas of abnormal diffusion and T2 signal in the frontal lobes, occipital lobes, and  cerebellum. May reflect seizure effect. Hypoxic/ischemic injury is a consideration in the appropriate setting. Other etiologies such as infectious/inflammatory are less likely.   No proximal intracranial  vessel occlusion.   The brain MRI is reviewed in person. No acute changes are noted on DWI. No hemorrhages noted. There is increased signal involving the deep white matter area mostly in a paracentral distribution in the cerebrum particular the frontal and occipital region. More ominous is the similar symptoms involving the deep white matter area of the cerebellum which is unusual and more extensive. MRA fine    Sheila Austin A. Merlene Laughter, M.D.  Diplomate, [employer]ry [employer] Neurology ( Neurology). 10/21/2020, 7:31 AM

## 2020-10-21 NOTE — Progress Notes (Signed)
Patient Demographics:    Sheila Austin, is a 35 y.o. 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 female with pmhx significant for T2DM,CKD stage IV, chronic hypoxic respiratory failure with supplemental oxygen 3 LPM, HTN  History of noncompliance with medication presented on 10/21/2019 with with intractable emesis--  A/p 1)SIRS--- patient with leukocytosis, tachycardia and tachypnea??  Etiology, blood cultures pending C/n  with Rocephin and Flagyl pending culture data --CT abdomen and pelvis with bilateral pleural effusions and anasarca otherwise no acute findings -Chest x-ray without evidence of acute infection, does show possible pulmonary edema and pleural effusions --Lactic acid 5.5, repeat lactic acid 2.7 -WBC is 19.6>> 14.0   2)Acute Metabolic Encephalopathy--- patient arrived in the ED lethargic after  being found unresponsive at home with concern for possible seizures While in the ED being administered IV Reglan she had involuntary movements concerning for possible Reglan induced Dystonic Reaction Versus Seizures -Patient received IV lorazepam and IV Benadryl and involuntary movements ceased--- MRI Brain and MRA Head w/o acute stroke--- IMPRESSION: Areas of abnormal diffusion and T2 signal in the frontal lobes, occipital lobes, and cerebellum. May reflect seizure effect. Hypoxic/ischemic injury is a consideration in the appropriate setting. Other etiologies such as infectious/inflammatory are less likely. -CT head without acute findings --Serum ammonia is 25 -UDS Neg -EEG w/o epileptiform findings -Patient received lorazepam and IV Keppra in the ED,  neurology consult appreciated, recommends continuing Keppra for at least 1 more   3)HFpEF--suspect chronic diastolic dysfunction CHF, echo from 09/24/2020 with EF of 60 to 65%, -BNP is 1253, -Troponin is 20 ---CT abdomen and pelvis with bilateral pleural effusions and anasarca otherwise no acute findings -Chest x-ray  show possible pulmonary edema and pleural effusions -Avoid over aggressive diuresis at this time given concerns about possible sepsis with lactic acidosis   4)Intractable Emesis--suspect due to diabetic gastroparesis, lipase WNL, continue antiemetics, avoid Reglan given concerns about possible dystonic reaction, patient previously tolerated Reglan may be worthwhile doing another trial of Reglan prior to discharge   5)HTN--c/n  Amlodipine, Coreg, hydralazine isosorbide, use IV labetalol as needed for elevated BP   6)DM1--last A1c is 5.9 reflecting excellent diabetic  control PTA insulin regimen adjusted -Use Novolog/Humalog Sliding scale insulin with Accu-Cheks/Fingersticks as ordered    7) CKD stage -IV --renal function may worsen in the setting of  intractable emesis with dehydration    Prior  baseline creatinine = 2.7 to  3.4 per [facility] -Over the last month however creatinine has been around 4.7 --renally adjust medications, avoid nephrotoxic agents / dehydration  / hypotension Pt follows-up with primary nephrologist Dr. Marian Sorrow in [place]   8)Recurrent episodes of acute urinary retention--- Foley placed on 10/20/2020, give Flomax   9)Recurrent hypoxic respiratory failure--- at baseline uses oxygen at 2 to 3 L via nasal cannula------ -Patient apparently was placed on home O2 after being treated with pneumonia previously  -During patient's recent hospitalization hypoxia resolved   10)Iron deficiency anemia- -as well as anemia of CKD  Hgb 10.3>> 7.7 after hydration --serum iron is 24, TIBC is 181 -Ferritin is not low B12 and folate are not low -denies menorrhagia, -No active bleeding concerns -  baseline usually between 9 and 10 -Colonoscopy in 2015 for diarrhea was overall normal and normal colonic biopsies. Last EGD in 2010 normal. -Patient apparently would like to have endoluminal evaluation as outpatient FOBT is neg recently -Iron tablets as advised, patient to follow-up with nephrologist for adjustments of her Aranesp dose and/or  frequency   11)Morbid Obesity- -Low calorie diet, portion control and increase physical activity discussed with patient -Body mass index is 60.52 kg/m.   Disposition/Need for in-Hospital Stay- patient unable to be discharged at this time due to --SIRS requiring IV antibiotics and IV fluids pending culture data, possible seizures new onset requiring further work-up   Status is: Inpatient   Remains inpatient appropriate because: see disposition above   Dispo: The patient is from: Home              Anticipated d/c is to: Home              Anticipated d/c date is: > 3 days              Patient currently is not medically stable to d/c. Barriers: Not Clinically Stable-  Code Status :  -  Code Status: Full Code   Family Communication:     (patient is  alert, awake and coherent)  Unable to reach boyfriend Otis  Consults  :  neurology  DVT Prophylaxis  :   - SCDs  heparin injection 5,000 Units Start: 10/20/20 0900    Lab Results  Component Value Date   PLT 220 10/21/2020    Inpatient Medications  Scheduled Meds:  amLODipine  10 mg Oral Daily   ascorbic acid  1,000 mg Oral BID   carvedilol  12.5 mg Oral BID   Chlorhexidine Gluconate Cloth  6 each Topical Daily   cloNIDine  0.1 mg Transdermal Q Sun   ferrous sulfate  325 mg Oral Q breakfast   heparin  5,000 Units Subcutaneous Q8H   hydrALAZINE  100 mg Oral TID   insulin aspart  0-15 Units Subcutaneous TID WC   insulin aspart  0-5 Units Subcutaneous QHS   insulin glargine  8 Units Subcutaneous QHS   isosorbide mononitrate  30 mg Oral Daily   LORazepam  1 mg Intravenous Once   mupirocin ointment   Nasal BID   sodium bicarbonate  650 mg Oral BID   sodium chloride flush  10-40 mL Intracatheter Q12H   tamsulosin  0.4 mg Oral QPC supper   Vitamin  D (Ergocalciferol)  50,000 Units Oral Weekly   zinc sulfate  220 mg Oral Daily   Continuous Infusions:  sodium chloride 150 mL/hr at 10/21/20 1513   cefTRIAXone (ROCEPHIN)  IV 2 g (10/20/20 1813)   levETIRAcetam 500 mg (10/21/20 1023)   metronidazole 500 mg (10/21/20 1024)   PRN Meds:.acetaminophen **OR** acetaminophen, albuterol, bisacodyl, labetalol, LORazepam, ondansetron **OR** ondansetron (ZOFRAN) IV, polyethylene glycol, promethazine, sodium chloride flush, traZODone    Anti-infectives (From admission, onward)    Start     Dose/Rate Route Frequency Ordered Stop   10/20/20 1730  cefTRIAXone (ROCEPHIN) 2 g in sodium chloride 0.9 % 100 mL IVPB        2 g 200 mL/hr over 30 Minutes Intravenous Every 24 hours 10/20/20 1637     10/20/20 1730  metroNIDAZOLE (FLAGYL) IVPB 500 mg        500 mg 100 mL/hr over 60 Minutes Intravenous Every 8 hours 10/20/20 1637     10/20/20 0730  ciprofloxacin (CIPRO) IVPB 400 mg       See  Hyperspace for full Linked Orders Report.   400 mg 200 mL/hr over 60 Minutes Intravenous  Once 10/20/20 0720 10/20/20 0915   10/20/20 0730  metroNIDAZOLE (FLAGYL) IVPB 500 mg       See Hyperspace for full Linked Orders Report.   500 mg 100 mL/hr over 60 Minutes Intravenous  Once 10/20/20 0720 10/20/20 1112         Objective:   Vitals:   10/21/20 1400 10/21/20 1500 10/21/20 1600 10/21/20 1630  BP: (!) 157/94 (!) 165/85 (!) 161/91   Pulse: 91 93 92   Resp: (!) 25 (!) 24 (!) 21   Temp:   98.1 F (36.7 C) 98.2 F (36.8 C)  TempSrc:   Oral Oral  SpO2: 93% 94% 93%   Weight:      Height:        Wt Readings from Last 3 Encounters:  10/21/20 (!) 153.7 kg  09/27/20 (!) 150.1 kg  03/05/20 (!) 137 kg     Intake/Output Summary (Last 24 hours) at 10/21/2020 1722 Last data filed at 10/21/2020 1026 Gross per 24 hour  Intake 1112.31 ml  Output 250 ml  Net 862.31 ml     Physical Exam  Gen:- Awake Alert,  in no apparent distress  HEENT:- [place].AT, No sclera icterus Neck-Supple Neck,No JVD,.  Lungs-  CTAB , fair symmetrical air movement CV- S1, S2 normal, regular  Abd-  +ve B.Sounds, Abd Soft, No tenderness,    Extremity/Skin:- ve Lt LE edema,  pedal pulses present, Rt BKA  Psych-affect is appropriate, oriented x3 Neuro-no new focal deficits, no tremors   Data Review:   Micro Results Recent Results (from the past 240 hour(s))  Blood Cultures (routine x 2)     Status: None (Preliminary result)   Collection Time: 10/20/20  7:01 AM   Specimen: BLOOD  Result Value Ref Range Status   Specimen Description BLOOD BLOOD LEFT WRIST  Final   Special Requests   Final    Blood Culture adequate volume BOTTLES DRAWN AEROBIC AND ANAEROBIC   Culture   Final    NO GROWTH < 24 HOURS Performed at [facility], [address], [place], [place] [address]    Report Status PENDING  Incomplete  Blood Cultures (routine x 2)     Status: None (Preliminary result)   Collection Time: 10/20/20  7:02  AM   Specimen: BLOOD  Result Value Ref Range Status  Specimen Description BLOOD BLOOD RIGHT WRIST  Final   Special Requests   Final    Blood Culture adequate volume BOTTLES DRAWN AEROBIC AND ANAEROBIC   Culture   Final    NO GROWTH < 24 HOURS Performed at [facility], [address] [place], [place] [address]    Report Status PENDING  Incomplete  Resp Panel by RT-PCR (Flu A&B, Covid) Nasopharyngeal Swab     Status: None   Collection Time: 10/20/20  7:10 AM   Specimen: Nasopharyngeal Swab; Nasopharyngeal(NP) swabs in vial transport medium  Result Value Ref Range Status   SARS Coronavirus 2 by RT PCR NEGATIVE NEGATIVE Final    Comment: (NOTE) SARS-CoV-2 target nucleic acids are NOT DETECTED.  The SARS-CoV-2 RNA is generally detectable in upper respiratory specimens during the acute phase of infection. The lowest concentration of SARS-CoV-2 viral copies this assay can detect is 138 copies/mL. A negative result does not preclude SARS-Cov-2 infection and should not be used as the sole basis for treatment or other patient management decisions. A negative result may occur with  improper specimen collection/handling, submission of specimen other than nasopharyngeal swab, presence of viral mutation(s) within the areas targeted by this assay, and inadequate number of viral copies(<138 copies/mL). A negative result must be combined with clinical observations, patient history, and epidemiological information. The expected result is Negative.  Fact Sheet for Patients:  EntrepreneurPulse.com.au  Fact Sheet for Healthcare Providers:  IncredibleEmployment.be  This test is no t yet approved or cleared by the [place] FDA and  has been authorized for detection and/or diagnosis of SARS-CoV-2 by FDA under an Emergency Use Authorization (EUA). This EUA will remain  in effect (meaning this test can be used) for the duration of the COVID-19 declaration  under Section 564(b)(1) of the Act, 21 U.S.C.section 360bbb-3(b)(1), unless the authorization is terminated  or revoked sooner.       Influenza A by PCR NEGATIVE NEGATIVE Final   Influenza B by PCR NEGATIVE NEGATIVE Final    Comment: (NOTE) The Xpert Xpress SARS-CoV-2/FLU/RSV plus assay is intended as an aid in the diagnosis of influenza from Nasopharyngeal swab specimens and should not be used as a sole basis for treatment. Nasal washings and aspirates are unacceptable for Xpert Xpress SARS-CoV-2/FLU/RSV testing.  Fact Sheet for Patients: EntrepreneurPulse.com.au  Fact Sheet for Healthcare Providers: IncredibleEmployment.be  This test is not yet approved or cleared by the [place] FDA and has been authorized for detection and/or diagnosis of SARS-CoV-2 by FDA under an Emergency Use Authorization (EUA). This EUA will remain in effect (meaning this test can be used) for the duration of the COVID-19 declaration under Section 564(b)(1) of the Act, 21 U.S.C. section 360bbb-3(b)(1), unless the authorization is terminated or revoked.  Performed at [facility], [address], [place], [place] [address]   MRSA Next Gen by PCR, Nasal     Status: Abnormal   Collection Time: 10/20/20  1:49 PM   Specimen: Nasal Mucosa; Nasal Swab  Result Value Ref Range Status   MRSA by PCR Next Gen DETECTED (A) NOT DETECTED Final    Comment: RESULT CALLED TO, READ BACK BY AND VERIFIED WITH: SHELTON,A'@1002'$  BY MATTHEWS, B 6.17.22 (NOTE) The GeneXpert MRSA Assay (FDA approved for NASAL specimens only), is one component of a comprehensive MRSA colonization surveillance program. It is not intended to diagnose MRSA infection nor to guide or monitor treatment for MRSA infections. Test performance is not FDA approved in patients less than 39 years old. Performed  at [facility], [address], [place], [place] [address]     Radiology Reports CT ABDOMEN PELVIS WO  CONTRAST  Result Date: 10/20/2020 CLINICAL DATA:  Found unresponsive, recent CPR EXAM: CT ABDOMEN AND PELVIS WITHOUT CONTRAST TECHNIQUE: Multidetector CT imaging of the abdomen and pelvis was performed following the standard protocol without IV contrast. COMPARISON:  06/25/2020 FINDINGS: Lower chest: Bilateral pleural effusions are noted right greater than left increased from the prior exam. Small pericardial effusion is noted. Hepatobiliary: No focal liver abnormality is seen. No gallstones, gallbladder wall thickening, or biliary dilatation. Pancreas: Unremarkable. No pancreatic ductal dilatation or surrounding inflammatory changes. Spleen: Normal in size without focal abnormality. Adrenals/Urinary Tract: Adrenal glands are within normal limits bilaterally. Kidneys are well visualized bilaterally without renal calculi or obstructive changes. Bladder is well distended. Stomach/Bowel: Appendix is not well visualized. No obstructive or inflammatory changes of the colon are seen. Small bowel and stomach appear within normal limits. Vascular/Lymphatic: No significant vascular findings are present. No enlarged abdominal or pelvic lymph nodes. Reproductive: Uterus and bilateral adnexa are unremarkable. Other: No abdominal wall hernia or abnormality. No abdominopelvic ascites. Musculoskeletal: No acute bony abnormality is noted. Generalized soft tissue edema is noted consistent with a degree of anasarca. IMPRESSION: Bilateral effusions right greater than left increased from the prior exam. Changes consistent with anasarca. No other focal abnormality is seen. Electronically Signed   By: Inez Catalina M.D.   On: 10/20/2020 08:10   CT HEAD WO CONTRAST  Result Date: 10/20/2020 CLINICAL DATA:  Found unresponsive, history of recent CPR EXAM: CT HEAD WITHOUT CONTRAST TECHNIQUE: Contiguous axial images were obtained from the base of the skull through the vertex without intravenous contrast. COMPARISON:  05/10/2020 FINDINGS:  Brain: No evidence of acute infarction, hemorrhage, hydrocephalus, extra-axial collection or mass lesion/mass effect. Vascular: No hyperdense vessel or unexpected calcification. Skull: Normal. Negative for fracture or focal lesion. Sinuses/Orbits: No acute finding. Other: None. IMPRESSION: No acute abnormality noted. Electronically Signed   By: Inez Catalina M.D.   On: 10/20/2020 08:07   MR ANGIO HEAD WO CONTRAST  Result Date: 10/20/2020 CLINICAL DATA:  Seizure EXAM: MRI HEAD WITHOUT CONTRAST MRA HEAD WITHOUT CONTRAST TECHNIQUE: Multiplanar, multi-echo pulse sequences of the brain and surrounding structures were acquired without intravenous contrast. Angiographic images of the Circle of Willis were acquired using MRA technique without intravenous contrast. COMPARISON: No pertinent prior exam. COMPARISON:  No pertinent prior exam. FINDINGS: MRI HEAD FINDINGS Motion artifact is present. Brain: There is cortical diffusion hyperintensity with corresponding T2 hyperintensity in the frontal lobes bilaterally involving a small area of adjoining middle and precentral gyri. Foci of cortical T2 hyperintensity are noted in the occipital lobes bilaterally. There is patchy T2 hyperintensity in the cerebellum bilaterally. No evidence of intracranial hemorrhage. No intracranial mass or mass effect. Patchy foci of T2 hyperintensity in the supratentorial and pontine white matter nonspecific but may reflect mild chronic microvascular ischemic changes. Vascular: Major vessel flow voids at the skull base are preserved. Skull and upper cervical spine: Marrow signal is grossly within normal limits. Sinuses/Orbits: Trace paranasal sinus mucosal thickening. Orbits are unremarkable. Other: Mastoid air cells are clear.  Sella is unremarkable. MRA HEAD FINDINGS Motion artifact is present. Anterior circulation: Intracranial internal carotid arteries are patent. Proximal anterior and middle cerebral arteries are patent. Posterior  circulation: Intracranial vertebral arteries are patent. Basilar artery is patent. Proximal posterior cerebral arteries are patent. IMPRESSION: Areas of abnormal diffusion and T2 signal in the frontal lobes, occipital lobes, and  cerebellum. May reflect seizure effect. Hypoxic/ischemic injury is a consideration in the appropriate setting. Other etiologies such as infectious/inflammatory are less likely. No proximal intracranial vessel occlusion. Electronically Signed   By: Macy Mis M.D.   On: 10/20/2020 13:18   MR BRAIN WO CONTRAST  Result Date: 10/20/2020 CLINICAL DATA:  Seizure EXAM: MRI HEAD WITHOUT CONTRAST MRA HEAD WITHOUT CONTRAST TECHNIQUE: Multiplanar, multi-echo pulse sequences of the brain and surrounding structures were acquired without intravenous contrast. Angiographic images of the Circle of Willis were acquired using MRA technique without intravenous contrast. COMPARISON: No pertinent prior exam. COMPARISON:  No pertinent prior exam. FINDINGS: MRI HEAD FINDINGS Motion artifact is present. Brain: There is cortical diffusion hyperintensity with corresponding T2 hyperintensity in the frontal lobes bilaterally involving a small area of adjoining middle and precentral gyri. Foci of cortical T2 hyperintensity are noted in the occipital lobes bilaterally. There is patchy T2 hyperintensity in the cerebellum bilaterally. No evidence of intracranial hemorrhage. No intracranial mass or mass effect. Patchy foci of T2 hyperintensity in the supratentorial and pontine white matter nonspecific but may reflect mild chronic microvascular ischemic changes. Vascular: Major vessel flow voids at the skull base are preserved. Skull and upper cervical spine: Marrow signal is grossly within normal limits. Sinuses/Orbits: Trace paranasal sinus mucosal thickening. Orbits are unremarkable. Other: Mastoid air cells are clear.  Sella is unremarkable. MRA HEAD FINDINGS Motion artifact is present. Anterior circulation:  Intracranial internal carotid arteries are patent. Proximal anterior and middle cerebral arteries are patent. Posterior circulation: Intracranial vertebral arteries are patent. Basilar artery is patent. Proximal posterior cerebral arteries are patent. IMPRESSION: Areas of abnormal diffusion and T2 signal in the frontal lobes, occipital lobes, and cerebellum. May reflect seizure effect. Hypoxic/ischemic injury is a consideration in the appropriate setting. Other etiologies such as infectious/inflammatory are less likely. No proximal intracranial vessel occlusion. Electronically Signed   By: Macy Mis M.D.   On: 10/20/2020 13:18   DG Chest Portable 1 View  Result Date: 10/20/2020 CLINICAL DATA:  Altered mental status EXAM: PORTABLE CHEST 1 VIEW COMPARISON:  Radiograph 09/24/2020, chest CT 11/08/2019 FINDINGS: Unchanged, enlarged cardiac silhouette. There are right upper lobe airspace opacities, appearance possibly in part due to overlying soft tissues. Small bilateral pleural effusions and bibasilar atelectasis. Mild interstitial opacities. No acute osseous abnormality. IMPRESSION: Cardiomegaly with diffuse interstitial opacities, likely pulmonary edema. Right upper lung opacities, which may in part be due to overlying soft tissues, developing infection or alveolar edema cannot be excluded. Small bilateral pleural effusions with adjacent atelectasis. Electronically Signed   By: Maurine Simmering   On: 10/20/2020 07:43   DG Chest Port 1 View  Result Date: 09/24/2020 CLINICAL DATA:  Shortness of breath EXAM: PORTABLE CHEST 1 VIEW COMPARISON:  05/19/2020 FINDINGS: Mild cardiomegaly with interstitial opacities. No pleural effusion or pneumothorax. IMPRESSION: Mild cardiomegaly and interstitial opacities, likely pulmonary edema. Electronically Signed   By: Ulyses Jarred M.D.   On: 09/24/2020 01:17   EEG adult  Result Date: 10/21/2020 Lora Havens, MD     10/21/2020  8:47 AM Patient Name: Sheila Austin  MRN: [id number] Epilepsy Attending: Lora Havens Referring Physician/Provider: Dr Roxan Hockey Date: 10/20/2020 Duration: 23.12 mins Patient history: 35 year old female with altered mental status with concern for possible seizures.  EEG to evaluate for seizures. Level of alertness: Awake, asleep AEDs during EEG study: LEV Technical aspects: This EEG study was done with scalp electrodes positioned according to the 10-20 International system of electrode placement. Electrical activity was  acquired at a sampling rate of '500Hz'$  and reviewed with a high frequency filter of '70Hz'$  and a low frequency filter of '1Hz'$ . EEG data were recorded continuously and digitally stored. Description: The posterior dominant rhythm consists of 8-9 Hz activity of moderate voltage (25-35 uV) seen predominantly in posterior head regions, symmetric and reactive to eye opening and eye closing. Sleep was characterized by vertex waves, sleep spindles (12 to 14 Hz), maximal frontocentral region. Hyperventilation and photic stimulation were not performed.   IMPRESSION: This study is within normal limits. No seizures or epileptiform discharges were seen throughout the recording. Lora Havens   ECHOCARDIOGRAM COMPLETE  Result Date: 09/24/2020    ECHOCARDIOGRAM REPORT   Patient Name:   Sheila Austin Date of Exam: 09/24/2020 Medical Rec #:  [id number]        Height:       62.0 in Accession #:    [id number]       Weight:       330.0 lb Date of Birth:  1986-01-12        BSA:          2.365 m Patient Age:    41 years         BP:           139/78 mmHg Patient Gender: F                HR:           96 bpm. Exam Location:  Inpatient Procedure: 2D Echo, 3D Echo, Cardiac Doppler, Color Doppler and Strain Analysis Indications:    CHF-Acute Systolic [id number]  History:        Patient has prior history of Echocardiogram examinations, most                 recent 10/05/2011. Signs/Symptoms:Shortness of Breath; Risk                 Factors:Hypertension and  Diabetes. Chronic kidney disease.                 Vomiting. Cardiomegaly, Pulmonary edema. GERD Chronic                 respiratory failure with hypoxia.  Sonographer:    Darlina Sicilian RDCS Referring Phys: [id number] OLADAPO ADEFESO  Sonographer Comments: Image acquisition challenging due to respiratory motion. IMPRESSIONS  1. Left ventricular ejection fraction, by estimation, is 60 to 65%. The left ventricle has normal function. The left ventricle has no regional wall motion abnormalities. There is mild left ventricular hypertrophy. Left ventricular diastolic function could not be evaluated.  2. Right ventricular systolic function is hyperdynamic. The right ventricular size is normal.  3. The pericardial effusion is circumferential. There is no evidence of cardiac tamponade.  4. The mitral valve is abnormal. Trivial mitral valve regurgitation.  5. The aortic valve is tricuspid. Aortic valve regurgitation is not visualized.  6. The inferior vena cava is dilated in size with <50% respiratory variability, suggesting right atrial pressure of 15 mmHg. FINDINGS  Left Ventricle: Left ventricular ejection fraction, by estimation, is 60 to 65%. The left ventricle has normal function. The left ventricle has no regional wall motion abnormalities. The left ventricular internal cavity size was normal in size. There is  mild left ventricular hypertrophy. Left ventricular diastolic function could not be evaluated due to atrial fibrillation. Left ventricular diastolic function could not be evaluated. Right Ventricle: The right ventricular size is normal. No increase in right ventricular  wall thickness. Right ventricular systolic function is hyperdynamic. Left Atrium: Left atrial size was normal in size. Right Atrium: Right atrial size was normal in size. Pericardium: Trivial pericardial effusion is present. The pericardial effusion is circumferential. There is no evidence of cardiac tamponade. Mitral Valve: The mitral valve is  abnormal. There is mild thickening of the mitral valve leaflet(s). Trivial mitral valve regurgitation. Tricuspid Valve: The tricuspid valve is grossly normal. Tricuspid valve regurgitation is trivial. Aortic Valve: The aortic valve is tricuspid. Aortic valve regurgitation is not visualized. Pulmonic Valve: The pulmonic valve was normal in structure. Pulmonic valve regurgitation is not visualized. Aorta: The aortic root and ascending aorta are structurally normal, with no evidence of dilitation. Venous: The inferior vena cava is dilated in size with less than 50% respiratory variability, suggesting right atrial pressure of 15 mmHg. IAS/Shunts: No atrial level shunt detected by color flow Doppler.  LEFT VENTRICLE PLAX 2D LVIDd:         4.67 cm  Diastology LVIDs:         2.94 cm  LV e' medial:    6.31 cm/s LV PW:         1.21 cm  LV E/e' medial:  23.3 LV IVS:        1.27 cm  LV e' lateral:   5.66 cm/s LVOT diam:     1.95 cm  LV E/e' lateral: 26.0 LV SV:         65 LV SV Index:   27 LVOT Area:     2.99 cm  RIGHT VENTRICLE RV S prime:     16.60 cm/s TAPSE (M-mode): 2.0 cm LEFT ATRIUM             Index       RIGHT ATRIUM           Index LA diam:        4.40 cm 1.86 cm/m  RA Area:     15.05 cm LA Vol (A2C):   29.7 ml 12.56 ml/m RA Volume:   35.10 ml  14.84 ml/m LA Vol (A4C):   52.0 ml 22.01 ml/m LA Biplane Vol: 44.9 ml 18.98 ml/m  AORTIC VALVE LVOT Vmax:   128.00 cm/s LVOT Vmean:  99.300 cm/s LVOT VTI:    0.216 m  AORTA Ao Root diam: 2.70 cm Ao Asc diam:  3.10 cm MITRAL VALVE MV Area (PHT): 4.93 cm     SHUNTS MV Decel Time: 154 msec     Systemic VTI:  0.22 m MV E velocity: 147.00 cm/s  Systemic Diam: 1.95 cm MV A velocity: 99.40 cm/s MV E/A ratio:  1.48 Lyman Bishop MD Electronically signed by Lyman Bishop MD Signature Date/Time: 09/24/2020/5:26:55 PM    Final      CBC Recent Labs  Lab 10/20/20 0704 10/21/20 0521  WBC 19.6* 14.0*  HGB 10.3* 7.7*  HCT 32.4* 24.0*  PLT 346 220  MCV 89.8 91.6  MCH 28.5  29.4  MCHC 31.8 32.1  RDW 15.2 15.6*  LYMPHSABS 0.6*  --   MONOABS 0.5  --   EOSABS 0.0  --   BASOSABS 0.1  --     Chemistries  Recent Labs  Lab 10/20/20 0704 10/21/20 0521  NA 138 137  K 5.1 4.7  CL 110 109  CO2 15* 20*  GLUCOSE 286* 119*  BUN 47* 48*  CREATININE 4.79* 4.87*  CALCIUM 7.7* 7.1*  AST 22  --   ALT 11  --   ALKPHOS 124  --  BILITOT 0.7  --    ------------------------------------------------------------------------------------------------------------------ No results for input(s): CHOL, HDL, LDLCALC, TRIG, CHOLHDL, LDLDIRECT in the last 72 hours.  Lab Results  Component Value Date   HGBA1C 5.9 (H) 09/24/2020   ------------------------------------------------------------------------------------------------------------------ No results for input(s): TSH, T4TOTAL, T3FREE, THYROIDAB in the last 72 hours.  Invalid input(s): FREET3 ------------------------------------------------------------------------------------------------------------------ No results for input(s): VITAMINB12, FOLATE, FERRITIN, TIBC, IRON, RETICCTPCT in the last 72 hours.  Coagulation profile Recent Labs  Lab 10/20/20 0704  INR 1.2    No results for input(s): DDIMER in the last 72 hours.  Cardiac Enzymes No results for input(s): CKMB, TROPONINI, MYOGLOBIN in the last 168 hours.  Invalid input(s): CK ------------------------------------------------------------------------------------------------------------------    Component Value Date/Time   BNP 1,253.0 (H) 10/20/2020 [id number]     Roxan Hockey M.D on 10/21/2020 at 5:22 PM  Go to [email or web address] - for contact info  [facility] - Office  [phone]

## 2020-10-22 DIAGNOSIS — R6510 Systemic inflammatory response syndrome (SIRS) of non-infectious origin without acute organ dysfunction: Secondary | ICD-10-CM | POA: Diagnosis not present

## 2020-10-22 LAB — COMPREHENSIVE METABOLIC PANEL
ALT: 7 U/L (ref 0–44)
AST: 11 U/L — ABNORMAL LOW (ref 15–41)
Albumin: 2.1 g/dL — ABNORMAL LOW (ref 3.5–5.0)
Alkaline Phosphatase: 79 U/L (ref 38–126)
Anion gap: 6 (ref 5–15)
BUN: 47 mg/dL — ABNORMAL HIGH (ref 6–20)
CO2: 20 mmol/L — ABNORMAL LOW (ref 22–32)
Calcium: 6.7 mg/dL — ABNORMAL LOW (ref 8.9–10.3)
Chloride: 110 mmol/L (ref 98–111)
Creatinine, Ser: 5.02 mg/dL — ABNORMAL HIGH (ref 0.44–1.00)
GFR, Estimated: 11 mL/min — ABNORMAL LOW (ref >60)
Glucose, Bld: 93 mg/dL (ref 70–99)
Potassium: 4.6 mmol/L (ref 3.5–5.1)
Sodium: 136 mmol/L (ref 135–145)
Total Bilirubin: 0.3 mg/dL (ref 0.3–1.2)
Total Protein: 4.8 g/dL — ABNORMAL LOW (ref 6.5–8.1)

## 2020-10-22 LAB — CBC
HCT: 23 % — ABNORMAL LOW (ref 36.0–46.0)
Hemoglobin: 7.1 g/dL — ABNORMAL LOW (ref 12.0–15.0)
MCH: 28.7 pg (ref 26.0–34.0)
MCHC: 30.9 g/dL (ref 30.0–36.0)
MCV: 93.1 fL (ref 80.0–100.0)
Platelets: 206 10*3/uL (ref 150–400)
RBC: 2.47 MIL/uL — ABNORMAL LOW (ref 3.87–5.11)
RDW: 15.5 % (ref 11.5–15.5)
WBC: 8.9 10*3/uL (ref 4.0–10.5)
nRBC: 0 % (ref 0.0–0.2)

## 2020-10-22 LAB — GLUCOSE, CAPILLARY
Glucose-Capillary: 87 mg/dL (ref 70–99)
Glucose-Capillary: 96 mg/dL (ref 70–99)
Glucose-Capillary: 114 mg/dL — ABNORMAL HIGH (ref 70–99)
Glucose-Capillary: 100 mg/dL — ABNORMAL HIGH (ref 70–99)
Glucose-Capillary: 130 mg/dL — ABNORMAL HIGH (ref 70–99)

## 2020-10-22 LAB — URINE CULTURE: Culture: NO GROWTH

## 2020-10-22 LAB — PREPARE RBC (CROSSMATCH)

## 2020-10-22 MED ORDER — LACTULOSE 10 GM/15ML PO SOLN
30.0000 g | Freq: Once | ORAL | Status: AC
  Administered 2020-10-22: 30 g via ORAL
  Filled 2020-10-22: qty 60

## 2020-10-22 MED ORDER — POLYETHYLENE GLYCOL 3350 17 G PO PACK
17.0000 g | PACK | Freq: Two times a day (BID) | ORAL | Status: DC
  Filled 2020-10-22: qty 1

## 2020-10-22 MED ORDER — SODIUM CHLORIDE 0.9% IV SOLUTION: Freq: Once | INTRAVENOUS | Status: AC

## 2020-10-22 MED ORDER — FUROSEMIDE 10 MG/ML IJ SOLN
80.0000 mg | Freq: Once | INTRAMUSCULAR | Status: AC
  Administered 2020-10-23: 80 mg via INTRAVENOUS
  Filled 2020-10-22: qty 8

## 2020-10-22 MED ORDER — BISACODYL 10 MG RE SUPP
10.0000 mg | Freq: Every day | RECTAL | Status: DC
  Filled 2020-10-22: qty 1

## 2020-10-22 NOTE — Progress Notes (Signed)
Patient Demographics:    Ranay Mccommons, is a 35 y.o. 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 female with pmhx significant for T2DM,CKD stage IV, chronic hypoxic respiratory failure with supplemental oxygen 3 LPM, HTN  History of noncompliance with medication presented on 10/21/2019 with with intractable emesis--  A/p 1)SIRS--- patient with leukocytosis, tachycardia and tachypnea??  Etiology, blood cultures pending C/n  with Rocephin and Flagyl pending culture data --CT abdomen and pelvis with bilateral pleural effusions and anasarca otherwise no acute findings -Chest x-ray without evidence of acute infection, does show possible pulmonary edema and pleural effusions --Lactic acid 5.5, repeat lactic acid 2.7 -WBC is 19.6>> 14.0>>8.9 -Blood and urine cultures from 10/20/2020 NGTD   2)Acute Metabolic  Encephalopathy--- patient arrived in the ED lethargic after being found unresponsive at home with concern for possible seizures While in the ED being administered IV Reglan she had involuntary movements concerning for possible Reglan induced Dystonic Reaction Versus Seizures -Patient received IV lorazepam and IV Benadryl and involuntary movements ceased--- MRI Brain and MRA Head w/o acute stroke--- IMPRESSION: Areas of abnormal diffusion and T2 signal in the frontal lobes, occipital lobes, and cerebellum. May reflect seizure effect. Hypoxic/ischemic injury is a consideration in the appropriate setting. Other etiologies such as infectious/inflammatory are less likely. -CT head without acute findings --Serum ammonia is 25 -UDS Neg -EEG w/o epileptiform findings -Patient received lorazepam and IV Keppra in the ED,  neurology consult appreciated, recommends continuing Keppra for at least 1 more   3)HFpEF--suspect chronic diastolic dysfunction CHF, echo from 09/24/2020 with EF of 60 to 65%, -BNP is 1253, -Troponin is 20 ---CT abdomen and pelvis with bilateral pleural effusions and anasarca otherwise no acute findings -Chest x-ray  show possible pulmonary edema and pleural effusions -Avoid over aggressive diuresis at this time given concerns about possible sepsis with lactic acidosis   4)Intractable Emesis--suspect due to diabetic gastroparesis, lipase WNL, continue antiemetics, avoid Reglan given concerns about possible dystonic reaction, patient previously tolerated Reglan may be worthwhile doing another trial of Reglan prior to discharge   5)HTN--c/n  Amlodipine, Coreg, hydralazine isosorbide, use IV labetalol as needed for elevated BP  6)DM1--last A1c is 5.9 reflecting excellent diabetic control PTA insulin regimen adjusted -Use Novolog/Humalog Sliding scale insulin with Accu-Cheks/Fingersticks as ordered    7) CKD stage -IV --renal function may worsen in the setting of  intractable  emesis with dehydration    Prior  baseline creatinine = 2.7 to 3.4 per [facility] -Over the last month however creatinine has been around 4.7 -Creatinine trending up currently at 5.02 --renally adjust medications, avoid nephrotoxic agents / dehydration  / hypotension Pt follows-up with primary nephrologist Dr. Marian Sorrow in [place]   8)Recurrent episodes of acute urinary retention--- Foley placed on 10/20/2020, -continue Flomax   9)Recurrent hypoxic respiratory failure--- at baseline uses oxygen at 2 to 3 L via nasal cannula------ -Patient apparently was placed on home O2 after being treated with pneumonia previously  -Patient has been weaned off O2, hypoxia has resolved   10)Iron deficiency anemia- -as well as anemia of CKD  Hgb 10.3>> 7.7 >>7.1 after hydration -Patient received Aranesp on 10/21/2020 -Transfuse 1 unit of PRBC on 10/22/2020 with Lasix --serum iron is 24, TIBC is 181 -Ferritin is not low B12 and folate are not low -denies menorrhagia, -No active bleeding concerns -  baseline usually between 9 and 10 -Colonoscopy in 2015 for diarrhea was overall normal and normal colonic biopsies. Last EGD in 2010 normal. -Patient apparently would like to have endoluminal evaluation as outpatient FOBT is neg recently -Iron tablets as advised, patient to follow-up with nephrologist for adjustments of her Aranesp dose and/or  frequency   11)Morbid Obesity- -Low calorie diet, portion control and increase physical activity discussed with patient -Body mass index is 60.52 kg/m.  12)Constipation--- laxatives and suppository as ordered   Disposition/Need for in-Hospital Stay- patient unable to be discharged at this time due to --SIRS requiring IV antibiotics and IV fluids pending culture data, possible seizures new onset requiring further work-up   Status is: Inpatient   Remains inpatient appropriate because: see disposition above   Dispo: The patient is from: Home               Anticipated d/c is to: Home              Anticipated d/c date is: > 3 days              Patient currently is not medically stable to d/c. Barriers: Not Clinically Stable-  Code Status :  -  Code Status: Full Code   Family Communication:     (patient is alert, awake and coherent)  Unable to reach boyfriend Hendricks Milo--  Consults  :  neurology  DVT Prophylaxis  :   - SCDs  heparin injection 5,000 Units Start: 10/20/20 0900    Lab Results  Component Value Date   PLT 206 10/22/2020    Inpatient Medications  Scheduled Meds:  sodium chloride   Intravenous Once   amLODipine  10 mg Oral Daily   ascorbic acid  1,000 mg Oral BID   carvedilol  12.5 mg Oral BID   Chlorhexidine Gluconate Cloth  6 each Topical Daily   cloNIDine  0.1 mg Transdermal Q Sun   ferrous sulfate  325 mg Oral Q breakfast   furosemide  80 mg Intravenous Once   heparin  5,000 Units Subcutaneous Q8H   hydrALAZINE  100 mg Oral TID   insulin aspart  0-15 Units Subcutaneous TID WC   insulin aspart  0-5 Units Subcutaneous QHS   insulin glargine  8 Units Subcutaneous QHS  isosorbide mononitrate  30 mg Oral Daily   LORazepam  1 mg Intravenous Once   mupirocin ointment   Nasal BID   pneumococcal 23 valent vaccine  0.5 mL Intramuscular Tomorrow-1000   polyethylene glycol  17 g Oral BID   sodium bicarbonate  650 mg Oral BID   sodium chloride flush  10-40 mL Intracatheter Q12H   tamsulosin  0.4 mg Oral QPC supper   Vitamin D (Ergocalciferol)  50,000 Units Oral Weekly   zinc sulfate  220 mg Oral Daily   Continuous Infusions:  sodium chloride 10 mL/hr at 10/22/20 1224   cefTRIAXone (ROCEPHIN)  IV 2 g (10/22/20 1603)   levETIRAcetam 500 mg (10/22/20 0848)   metronidazole 500 mg (10/22/20 1604)   PRN Meds:.acetaminophen **OR** acetaminophen, albuterol, bisacodyl, labetalol, LORazepam, ondansetron **OR** ondansetron (ZOFRAN) IV, promethazine, sodium chloride flush, traZODone    Anti-infectives (From admission,  onward)    Start     Dose/Rate Route Frequency Ordered Stop   10/20/20 1730  cefTRIAXone (ROCEPHIN) 2 g in sodium chloride 0.9 % 100 mL IVPB        2 g 200 mL/hr over 30 Minutes Intravenous Every 24 hours 10/20/20 1637     10/20/20 1730  metroNIDAZOLE (FLAGYL) IVPB 500 mg        500 mg 100 mL/hr over 60 Minutes Intravenous Every 8 hours 10/20/20 1637     10/20/20 0730  ciprofloxacin (CIPRO) IVPB 400 mg       See Hyperspace for full Linked Orders Report.   400 mg 200 mL/hr over 60 Minutes Intravenous  Once 10/20/20 0720 10/20/20 0915   10/20/20 0730  metroNIDAZOLE (FLAGYL) IVPB 500 mg       See Hyperspace for full Linked Orders Report.   500 mg 100 mL/hr over 60 Minutes Intravenous  Once 10/20/20 0720 10/20/20 1112         Objective:   Vitals:   10/22/20 1530 10/22/20 1600 10/22/20 1630 10/22/20 1700  BP: (!) 172/91 135/88 (!) 144/86 138/61  Pulse: 92 88 88 86  Resp: '18 20 19 '$ (!) 21  Temp:  98.3 F (36.8 C)    TempSrc:  Oral    SpO2: 95% 99% 97% 96%  Weight:      Height:        Wt Readings from Last 3 Encounters:  10/21/20 (!) 153.7 kg  09/27/20 (!) 150.1 kg  03/05/20 (!) 137 kg     Intake/Output Summary (Last 24 hours) at 10/22/2020 1719 Last data filed at 10/22/2020 0700 Gross per 24 hour  Intake 3558.53 ml  Output 700 ml  Net 2858.53 ml    Physical Exam  Gen:- Awake Alert,  in no apparent distress  HEENT:- [place].AT, No sclera icterus Neck-Supple Neck,No JVD,.  Lungs-  CTAB , fair symmetrical air movement CV- S1, S2 normal, regular  Abd-  +ve B.Sounds, Abd Soft, No tenderness, increased truncal adiposity Extremity/Skin:- ve Lt LE edema,  pedal pulses present, Rt BKA  Psych-affect is appropriate, oriented x3 Neuro-no new focal deficits, no tremors GU-Foley in situ placed on 10/20/2020   Data Review:   Micro Results Recent Results (from the past 240 hour(s))  Urine culture     Status: None   Collection Time: 10/20/20  6:54 AM   Specimen: Urine, Clean  Catch  Result Value Ref Range Status   Specimen Description   Final    URINE, CLEAN CATCH Performed at [facility], [address] [place], [place] [address]  Special Requests   Final    NONE Performed at [facility], [address], [place], [place] [address]    Culture   Final    NO GROWTH Performed at [facility], [place] [address] [place], [place] [address]    Report Status 10/22/2020 FINAL  Final  Blood Cultures (routine x 2)     Status: None (Preliminary result)   Collection Time: 10/20/20  7:01 AM   Specimen: BLOOD  Result Value Ref Range Status   Specimen Description BLOOD BLOOD LEFT WRIST  Final   Special Requests   Final    Blood Culture adequate volume BOTTLES DRAWN AEROBIC AND ANAEROBIC   Culture   Final    NO GROWTH 2 DAYS Performed at [facility], [address], [place], [place] [address]    Report Status PENDING  Incomplete  Blood Cultures (routine x 2)     Status: None (Preliminary result)   Collection Time: 10/20/20  7:02 AM   Specimen: BLOOD  Result Value Ref Range Status   Specimen Description BLOOD BLOOD RIGHT WRIST  Final   Special Requests   Final    Blood Culture adequate volume BOTTLES DRAWN AEROBIC AND ANAEROBIC   Culture   Final    NO GROWTH 2 DAYS Performed at [facility], [address] [place], [place] [address]    Report Status PENDING  Incomplete  Resp Panel by RT-PCR (Flu A&B, Covid) Nasopharyngeal Swab     Status: None   Collection Time: 10/20/20  7:10 AM   Specimen: Nasopharyngeal Swab; Nasopharyngeal(NP) swabs in vial transport medium  Result Value Ref Range Status   SARS Coronavirus 2 by RT PCR NEGATIVE NEGATIVE Final    Comment: (NOTE) SARS-CoV-2 target nucleic acids are NOT DETECTED.  The SARS-CoV-2 RNA is generally detectable in upper respiratory specimens during the acute phase of infection. The lowest concentration of SARS-CoV-2 viral copies this assay can detect is 138 copies/mL. A negative result does not preclude  SARS-Cov-2 infection and should not be used as the sole basis for treatment or other patient management decisions. A negative result may occur with  improper specimen collection/handling, submission of specimen other than nasopharyngeal swab, presence of viral mutation(s) within the areas targeted by this assay, and inadequate number of viral copies(<138 copies/mL). A negative result must be combined with clinical observations, patient history, and epidemiological information. The expected result is Negative.  Fact Sheet for Patients:  EntrepreneurPulse.com.au  Fact Sheet for Healthcare Providers:  IncredibleEmployment.be  This test is no t yet approved or cleared by the [place] FDA and  has been authorized for detection and/or diagnosis of SARS-CoV-2 by FDA under an Emergency Use Authorization (EUA). This EUA will remain  in effect (meaning this test can be used) for the duration of the COVID-19 declaration under Section 564(b)(1) of the Act, 21 U.S.C.section 360bbb-3(b)(1), unless the authorization is terminated  or revoked sooner.       Influenza A by PCR NEGATIVE NEGATIVE Final   Influenza B by PCR NEGATIVE NEGATIVE Final    Comment: (NOTE) The Xpert Xpress SARS-CoV-2/FLU/RSV plus assay is intended as an aid in the diagnosis of influenza from Nasopharyngeal swab specimens and should not be used as a sole basis for treatment. Nasal washings and aspirates are unacceptable for Xpert Xpress SARS-CoV-2/FLU/RSV testing.  Fact Sheet for Patients: EntrepreneurPulse.com.au  Fact Sheet for Healthcare Providers: IncredibleEmployment.be  This test is not yet approved or cleared by the [place] FDA and has been  authorized for detection and/or diagnosis of SARS-CoV-2 by FDA under an Emergency Use Authorization (EUA). This EUA will remain in effect (meaning this test can be used) for the duration of  the COVID-19 declaration under Section 564(b)(1) of the Act, 21 U.S.C. section 360bbb-3(b)(1), unless the authorization is terminated or revoked.  Performed at [facility], [address], [place], [place] [address]   MRSA Next Gen by PCR, Nasal     Status: Abnormal   Collection Time: 10/20/20  1:49 PM   Specimen: Nasal Mucosa; Nasal Swab  Result Value Ref Range Status   MRSA by PCR Next Gen DETECTED (A) NOT DETECTED Final    Comment: RESULT CALLED TO, READ BACK BY AND VERIFIED WITH: SHELTON,A'@1002'$  BY MATTHEWS, B 6.17.22 (NOTE) The GeneXpert MRSA Assay (FDA approved for NASAL specimens only), is one component of a comprehensive MRSA colonization surveillance program. It is not intended to diagnose MRSA infection nor to guide or monitor treatment for MRSA infections. Test performance is not FDA approved in patients less than 47 years old. Performed at [facility], [address], [place], [place] [address]    Radiology Reports CT ABDOMEN PELVIS WO CONTRAST  Result Date: 10/20/2020 CLINICAL DATA:  Found unresponsive, recent CPR EXAM: CT ABDOMEN AND PELVIS WITHOUT CONTRAST TECHNIQUE: Multidetector CT imaging of the abdomen and pelvis was performed following the standard protocol without IV contrast. COMPARISON:  06/25/2020 FINDINGS: Lower chest: Bilateral pleural effusions are noted right greater than left increased from the prior exam. Small pericardial effusion is noted. Hepatobiliary: No focal liver abnormality is seen. No gallstones, gallbladder wall thickening, or biliary dilatation. Pancreas: Unremarkable. No pancreatic ductal dilatation or surrounding inflammatory changes. Spleen: Normal in size without focal abnormality. Adrenals/Urinary Tract: Adrenal glands are within normal limits bilaterally. Kidneys are well visualized bilaterally without renal calculi or obstructive changes. Bladder is well distended. Stomach/Bowel: Appendix is not well visualized. No obstructive or inflammatory  changes of the colon are seen. Small bowel and stomach appear within normal limits. Vascular/Lymphatic: No significant vascular findings are present. No enlarged abdominal or pelvic lymph nodes. Reproductive: Uterus and bilateral adnexa are unremarkable. Other: No abdominal wall hernia or abnormality. No abdominopelvic ascites. Musculoskeletal: No acute bony abnormality is noted. Generalized soft tissue edema is noted consistent with a degree of anasarca. IMPRESSION: Bilateral effusions right greater than left increased from the prior exam. Changes consistent with anasarca. No other focal abnormality is seen. Electronically Signed   By: Inez Catalina M.D.   On: 10/20/2020 08:10   CT HEAD WO CONTRAST  Result Date: 10/20/2020 CLINICAL DATA:  Found unresponsive, history of recent CPR EXAM: CT HEAD WITHOUT CONTRAST TECHNIQUE: Contiguous axial images were obtained from the base of the skull through the vertex without intravenous contrast. COMPARISON:  05/10/2020 FINDINGS: Brain: No evidence of acute infarction, hemorrhage, hydrocephalus, extra-axial collection or mass lesion/mass effect. Vascular: No hyperdense vessel or unexpected calcification. Skull: Normal. Negative for fracture or focal lesion. Sinuses/Orbits: No acute finding. Other: None. IMPRESSION: No acute abnormality noted. Electronically Signed   By: Inez Catalina M.D.   On: 10/20/2020 08:07   MR ANGIO HEAD WO CONTRAST  Result Date: 10/20/2020 CLINICAL DATA:  Seizure EXAM: MRI HEAD WITHOUT CONTRAST MRA HEAD WITHOUT CONTRAST TECHNIQUE: Multiplanar, multi-echo pulse sequences of the brain and surrounding structures were acquired without intravenous contrast. Angiographic images of the Circle of Willis were acquired using MRA technique without intravenous contrast. COMPARISON: No pertinent prior exam. COMPARISON:  No pertinent prior exam. FINDINGS: MRI HEAD FINDINGS Motion  artifact is present. Brain: There is cortical diffusion hyperintensity with  corresponding T2 hyperintensity in the frontal lobes bilaterally involving a small area of adjoining middle and precentral gyri. Foci of cortical T2 hyperintensity are noted in the occipital lobes bilaterally. There is patchy T2 hyperintensity in the cerebellum bilaterally. No evidence of intracranial hemorrhage. No intracranial mass or mass effect. Patchy foci of T2 hyperintensity in the supratentorial and pontine white matter nonspecific but may reflect mild chronic microvascular ischemic changes. Vascular: Major vessel flow voids at the skull base are preserved. Skull and upper cervical spine: Marrow signal is grossly within normal limits. Sinuses/Orbits: Trace paranasal sinus mucosal thickening. Orbits are unremarkable. Other: Mastoid air cells are clear.  Sella is unremarkable. MRA HEAD FINDINGS Motion artifact is present. Anterior circulation: Intracranial internal carotid arteries are patent. Proximal anterior and middle cerebral arteries are patent. Posterior circulation: Intracranial vertebral arteries are patent. Basilar artery is patent. Proximal posterior cerebral arteries are patent. IMPRESSION: Areas of abnormal diffusion and T2 signal in the frontal lobes, occipital lobes, and cerebellum. May reflect seizure effect. Hypoxic/ischemic injury is a consideration in the appropriate setting. Other etiologies such as infectious/inflammatory are less likely. No proximal intracranial vessel occlusion. Electronically Signed   By: Macy Mis M.D.   On: 10/20/2020 13:18   MR BRAIN WO CONTRAST  Result Date: 10/20/2020 CLINICAL DATA:  Seizure EXAM: MRI HEAD WITHOUT CONTRAST MRA HEAD WITHOUT CONTRAST TECHNIQUE: Multiplanar, multi-echo pulse sequences of the brain and surrounding structures were acquired without intravenous contrast. Angiographic images of the Circle of Willis were acquired using MRA technique without intravenous contrast. COMPARISON: No pertinent prior exam. COMPARISON:  No pertinent prior  exam. FINDINGS: MRI HEAD FINDINGS Motion artifact is present. Brain: There is cortical diffusion hyperintensity with corresponding T2 hyperintensity in the frontal lobes bilaterally involving a small area of adjoining middle and precentral gyri. Foci of cortical T2 hyperintensity are noted in the occipital lobes bilaterally. There is patchy T2 hyperintensity in the cerebellum bilaterally. No evidence of intracranial hemorrhage. No intracranial mass or mass effect. Patchy foci of T2 hyperintensity in the supratentorial and pontine white matter nonspecific but may reflect mild chronic microvascular ischemic changes. Vascular: Major vessel flow voids at the skull base are preserved. Skull and upper cervical spine: Marrow signal is grossly within normal limits. Sinuses/Orbits: Trace paranasal sinus mucosal thickening. Orbits are unremarkable. Other: Mastoid air cells are clear.  Sella is unremarkable. MRA HEAD FINDINGS Motion artifact is present. Anterior circulation: Intracranial internal carotid arteries are patent. Proximal anterior and middle cerebral arteries are patent. Posterior circulation: Intracranial vertebral arteries are patent. Basilar artery is patent. Proximal posterior cerebral arteries are patent. IMPRESSION: Areas of abnormal diffusion and T2 signal in the frontal lobes, occipital lobes, and cerebellum. May reflect seizure effect. Hypoxic/ischemic injury is a consideration in the appropriate setting. Other etiologies such as infectious/inflammatory are less likely. No proximal intracranial vessel occlusion. Electronically Signed   By: Macy Mis M.D.   On: 10/20/2020 13:18   DG Chest Portable 1 View  Result Date: 10/20/2020 CLINICAL DATA:  Altered mental status EXAM: PORTABLE CHEST 1 VIEW COMPARISON:  Radiograph 09/24/2020, chest CT 11/08/2019 FINDINGS: Unchanged, enlarged cardiac silhouette. There are right upper lobe airspace opacities, appearance possibly in part due to overlying soft  tissues. Small bilateral pleural effusions and bibasilar atelectasis. Mild interstitial opacities. No acute osseous abnormality. IMPRESSION: Cardiomegaly with diffuse interstitial opacities, likely pulmonary edema. Right upper lung opacities, which may in part be due to overlying soft tissues, developing infection  or alveolar edema cannot be excluded. Small bilateral pleural effusions with adjacent atelectasis. Electronically Signed   By: Maurine Simmering   On: 10/20/2020 07:43   DG Chest Port 1 View  Result Date: 09/24/2020 CLINICAL DATA:  Shortness of breath EXAM: PORTABLE CHEST 1 VIEW COMPARISON:  05/19/2020 FINDINGS: Mild cardiomegaly with interstitial opacities. No pleural effusion or pneumothorax. IMPRESSION: Mild cardiomegaly and interstitial opacities, likely pulmonary edema. Electronically Signed   By: Ulyses Jarred M.D.   On: 09/24/2020 01:17   EEG adult  Result Date: 10/21/2020 Lora Havens, MD     10/21/2020  8:47 AM Patient Name: MAYGAN DOMINIQUE MRN: [id number] Epilepsy Attending: Lora Havens Referring Physician/Provider: Dr Roxan Hockey Date: 10/20/2020 Duration: 23.12 mins Patient history: 35 year old female with altered mental status with concern for possible seizures.  EEG to evaluate for seizures. Level of alertness: Awake, asleep AEDs during EEG study: LEV Technical aspects: This EEG study was done with scalp electrodes positioned according to the 10-20 International system of electrode placement. Electrical activity was acquired at a sampling rate of '500Hz'$  and reviewed with a high frequency filter of '70Hz'$  and a low frequency filter of '1Hz'$ . EEG data were recorded continuously and digitally stored. Description: The posterior dominant rhythm consists of 8-9 Hz activity of moderate voltage (25-35 uV) seen predominantly in posterior head regions, symmetric and reactive to eye opening and eye closing. Sleep was characterized by vertex waves, sleep spindles (12 to 14 Hz), maximal  frontocentral region. Hyperventilation and photic stimulation were not performed.   IMPRESSION: This study is within normal limits. No seizures or epileptiform discharges were seen throughout the recording. Lora Havens   ECHOCARDIOGRAM COMPLETE  Result Date: 09/24/2020    ECHOCARDIOGRAM REPORT   Patient Name:   ANTWONETTE STETSER Date of Exam: 09/24/2020 Medical Rec #:  [id number]        Height:       62.0 in Accession #:    [id number]       Weight:       330.0 lb Date of Birth:  10/07/1985        BSA:          2.365 m Patient Age:    60 years         BP:           139/78 mmHg Patient Gender: F                HR:           96 bpm. Exam Location:  Inpatient Procedure: 2D Echo, 3D Echo, Cardiac Doppler, Color Doppler and Strain Analysis Indications:    CHF-Acute Systolic [id number]  History:        Patient has prior history of Echocardiogram examinations, most                 recent 10/05/2011. Signs/Symptoms:Shortness of Breath; Risk                 Factors:Hypertension and Diabetes. Chronic kidney disease.                 Vomiting. Cardiomegaly, Pulmonary edema. GERD Chronic                 respiratory failure with hypoxia.  Sonographer:    Darlina Sicilian RDCS Referring Phys: [id number] OLADAPO ADEFESO  Sonographer Comments: Image acquisition challenging due to respiratory motion. IMPRESSIONS  1. Left ventricular ejection fraction, by estimation, is 60 to 65%. The left  ventricle has normal function. The left ventricle has no regional wall motion abnormalities. There is mild left ventricular hypertrophy. Left ventricular diastolic function could not be evaluated.  2. Right ventricular systolic function is hyperdynamic. The right ventricular size is normal.  3. The pericardial effusion is circumferential. There is no evidence of cardiac tamponade.  4. The mitral valve is abnormal. Trivial mitral valve regurgitation.  5. The aortic valve is tricuspid. Aortic valve regurgitation is not visualized.  6. The inferior vena  cava is dilated in size with <50% respiratory variability, suggesting right atrial pressure of 15 mmHg. FINDINGS  Left Ventricle: Left ventricular ejection fraction, by estimation, is 60 to 65%. The left ventricle has normal function. The left ventricle has no regional wall motion abnormalities. The left ventricular internal cavity size was normal in size. There is  mild left ventricular hypertrophy. Left ventricular diastolic function could not be evaluated due to atrial fibrillation. Left ventricular diastolic function could not be evaluated. Right Ventricle: The right ventricular size is normal. No increase in right ventricular wall thickness. Right ventricular systolic function is hyperdynamic. Left Atrium: Left atrial size was normal in size. Right Atrium: Right atrial size was normal in size. Pericardium: Trivial pericardial effusion is present. The pericardial effusion is circumferential. There is no evidence of cardiac tamponade. Mitral Valve: The mitral valve is abnormal. There is mild thickening of the mitral valve leaflet(s). Trivial mitral valve regurgitation. Tricuspid Valve: The tricuspid valve is grossly normal. Tricuspid valve regurgitation is trivial. Aortic Valve: The aortic valve is tricuspid. Aortic valve regurgitation is not visualized. Pulmonic Valve: The pulmonic valve was normal in structure. Pulmonic valve regurgitation is not visualized. Aorta: The aortic root and ascending aorta are structurally normal, with no evidence of dilitation. Venous: The inferior vena cava is dilated in size with less than 50% respiratory variability, suggesting right atrial pressure of 15 mmHg. IAS/Shunts: No atrial level shunt detected by color flow Doppler.  LEFT VENTRICLE PLAX 2D LVIDd:         4.67 cm  Diastology LVIDs:         2.94 cm  LV e' medial:    6.31 cm/s LV PW:         1.21 cm  LV E/e' medial:  23.3 LV IVS:        1.27 cm  LV e' lateral:   5.66 cm/s LVOT diam:     1.95 cm  LV E/e' lateral: 26.0 LV  SV:         65 LV SV Index:   27 LVOT Area:     2.99 cm  RIGHT VENTRICLE RV S prime:     16.60 cm/s TAPSE (M-mode): 2.0 cm LEFT ATRIUM             Index       RIGHT ATRIUM           Index LA diam:        4.40 cm 1.86 cm/m  RA Area:     15.05 cm LA Vol (A2C):   29.7 ml 12.56 ml/m RA Volume:   35.10 ml  14.84 ml/m LA Vol (A4C):   52.0 ml 22.01 ml/m LA Biplane Vol: 44.9 ml 18.98 ml/m  AORTIC VALVE LVOT Vmax:   128.00 cm/s LVOT Vmean:  99.300 cm/s LVOT VTI:    0.216 m  AORTA Ao Root diam: 2.70 cm Ao Asc diam:  3.10 cm MITRAL VALVE MV Area (PHT): 4.93 cm     SHUNTS MV Decel  Time: 154 msec     Systemic VTI:  0.22 m MV E velocity: 147.00 cm/s  Systemic Diam: 1.95 cm MV A velocity: 99.40 cm/s MV E/A ratio:  1.48 Lyman Bishop MD Electronically signed by Lyman Bishop MD Signature Date/Time: 09/24/2020/5:26:55 PM    Final      CBC Recent Labs  Lab 10/20/20 0704 10/21/20 0521 10/22/20 0437  WBC 19.6* 14.0* 8.9  HGB 10.3* 7.7* 7.1*  HCT 32.4* 24.0* 23.0*  PLT 346 220 206  MCV 89.8 91.6 93.1  MCH 28.5 29.4 28.7  MCHC 31.8 32.1 30.9  RDW 15.2 15.6* 15.5  LYMPHSABS 0.6*  --   --   MONOABS 0.5  --   --   EOSABS 0.0  --   --   BASOSABS 0.1  --   --     Chemistries  Recent Labs  Lab 10/20/20 0704 10/21/20 0521 10/22/20 0437  NA 138 137 136  K 5.1 4.7 4.6  CL 110 109 110  CO2 15* 20* 20*  GLUCOSE 286* 119* 93  BUN 47* 48* 47*  CREATININE 4.79* 4.87* 5.02*  CALCIUM 7.7* 7.1* 6.7*  AST 22  --  11*  ALT 11  --  7  ALKPHOS 124  --  79  BILITOT 0.7  --  0.3   No results for input(s): CHOL, HDL, LDLCALC, TRIG, CHOLHDL, LDLDIRECT in the last 72 hours.  Lab Results  Component Value Date   HGBA1C 5.9 (H) 09/24/2020   ------------------------------------------------------------------------------------------------------------------ No results for input(s): TSH, T4TOTAL, T3FREE, THYROIDAB in the last 72 hours.  Invalid input(s):  FREET3 ------------------------------------------------------------------------------------------------------------------ No results for input(s): VITAMINB12, FOLATE, FERRITIN, TIBC, IRON, RETICCTPCT in the last 72 hours.  Coagulation profile Recent Labs  Lab 10/20/20 0704  INR 1.2    No results for input(s): DDIMER in the last 72 hours.  Cardiac Enzymes No results for input(s): CKMB, TROPONINI, MYOGLOBIN in the last 168 hours.  Invalid input(s): CK ------------------------------------------------------------------------------------------------------------------    Component Value Date/Time   BNP 1,253.0 (H) 10/20/2020 [id number]    Roxan Hockey M.D on 10/22/2020 at 5:19 PM  Go to [email or web address] - for contact info  [facility] - Office  [phone]

## 2020-10-23 DIAGNOSIS — R6510 Systemic inflammatory response syndrome (SIRS) of non-infectious origin without acute organ dysfunction: Secondary | ICD-10-CM | POA: Diagnosis not present

## 2020-10-23 LAB — TYPE AND SCREEN
ABO/RH(D): A POS
Antibody Screen: NEGATIVE
Unit division: 0

## 2020-10-23 LAB — GLUCOSE, CAPILLARY
Glucose-Capillary: 93 mg/dL (ref 70–99)
Glucose-Capillary: 104 mg/dL — ABNORMAL HIGH (ref 70–99)
Glucose-Capillary: 102 mg/dL — ABNORMAL HIGH (ref 70–99)
Glucose-Capillary: 107 mg/dL — ABNORMAL HIGH (ref 70–99)

## 2020-10-23 LAB — BPAM RBC
Blood Product Expiration Date: 202207032359
ISSUE DATE / TIME: 202206182237
Unit Type and Rh: 6200

## 2020-10-23 LAB — HEMOGLOBIN AND HEMATOCRIT, BLOOD
HCT: 25.9 % — ABNORMAL LOW (ref 36.0–46.0)
Hemoglobin: 8.2 g/dL — ABNORMAL LOW (ref 12.0–15.0)

## 2020-10-23 MED ORDER — FUROSEMIDE 10 MG/ML IJ SOLN
80.0000 mg | Freq: Once | INTRAMUSCULAR | Status: AC
  Administered 2020-10-23: 80 mg via INTRAVENOUS
  Filled 2020-10-23: qty 8

## 2020-10-23 NOTE — Progress Notes (Signed)
Patient Demographics:    Sheila Austin, is a 35 y.o. female, DOB - 04-10-1986, [id number]  Admit date - 10/20/2020   Admitting Physician Lawernce Earll Denton Brick, MD  Outpatient Primary MD for the patient is Pcp, No  LOS - 3   Chief Complaint  Patient presents with   Altered Mental Status        Subjective:    Sheila Austin today has no fevers, no emesis,  No chest pain,   -had multiple BMs...  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 female with pmhx significant for T2DM,CKD stage IV, chronic hypoxic respiratory failure with supplemental oxygen 3 LPM, HTN  History of noncompliance with medication presented on 10/21/2019 with with intractable emesis--  A/p 1)SIRS--- patient with leukocytosis, tachycardia and tachypnea??  Etiology, blood cultures pending C/n  with Rocephin and Flagyl pending culture data --CT abdomen and pelvis with bilateral pleural effusions and anasarca otherwise no acute findings -Chest x-ray without evidence of acute infection, does show possible pulmonary edema and pleural effusions --Lactic acid 5.5, repeat lactic acid 2.7 -WBC is 19.6>> 14.0>>8.9 -Blood and urine cultures from 10/20/2020 NGTD -Plan to discontinue antibiotic on 10/24/2020 if cultures are still  negative   2)Acute Metabolic Encephalopathy--- patient arrived in the ED lethargic after being found unresponsive at home with concern for possible seizures While in the ED being administered IV Reglan she had involuntary movements concerning for possible Reglan induced Dystonic Reaction Versus Seizures -Patient received IV lorazepam and IV Benadryl and involuntary movements ceased--- MRI Brain and MRA Head w/o acute stroke--- IMPRESSION: Areas of abnormal diffusion and T2 signal in the frontal lobes, occipital lobes, and cerebellum. May reflect seizure effect. Hypoxic/ischemic injury is a consideration in the appropriate setting. Other etiologies such as infectious/inflammatory are less likely. -CT head without acute findings --Serum ammonia is 25 -UDS Neg -EEG w/o epileptiform findings -Patient received lorazepam and IV Keppra in the ED,  neurology consult appreciated, recommends continuing Keppra for at least 1 more   3)HFpEF--suspect chronic diastolic dysfunction CHF, echo from 09/24/2020 with EF of 60 to 65%, -BNP is 1253, -Troponin is 20 ---CT abdomen and pelvis with bilateral pleural effusions and anasarca otherwise no acute findings -Chest x-ray  show possible pulmonary edema and pleural effusions -Avoid over aggressive diuresis at this time given concerns about possible sepsis with lactic acidosis   4)Intractable Emesis--suspect due to diabetic gastroparesis, lipase WNL, continue antiemetics, avoid Reglan given concerns about possible dystonic reaction, patient previously tolerated Reglan may be worthwhile doing another trial of Reglan prior to discharge   5)HTN--c/n  Amlodipine, Coreg, hydralazine isosorbide, use  IV labetalol as needed for elevated BP   6)DM1--last A1c is 5.9 reflecting excellent diabetic control PTA insulin regimen adjusted -Use Novolog/Humalog Sliding scale insulin with Accu-Cheks/Fingersticks as ordered    7) CKD stage -IV --renal function may worsen in  the setting of  intractable emesis with dehydration    Prior  baseline creatinine = 2.7 to 3.4 per [facility] -Over the last month however creatinine has been around 4.7 -Creatinine trending up currently at 5.02 --renally adjust medications, avoid nephrotoxic agents / dehydration  / hypotension Pt follows-up with primary nephrologist Dr. Marian Sorrow in [place]   8)Recurrent episodes of acute urinary retention--- Foley placed on 10/20/2020, -continue Flomax -We will attempt voiding trial prior to discharge   9)Recurrent hypoxic respiratory failure--- at baseline uses oxygen at 2 to 3 L via nasal cannula------ -Patient apparently was placed on home O2 after being treated with pneumonia previously  -Patient has been weaned off O2, hypoxia has resolved   10)Iron deficiency anemia- -as well as anemia of CKD  Hgb 10.3>> 7.7 >>7.1 after hydration -Hemoglobin is up to 8.2 posttransfusion -Patient received Aranesp on 10/21/2020 -Transfuse 1 unit of PRBC on 10/22/2020 with Lasix --serum iron is 24, TIBC is 181 -Ferritin is not low B12 and folate are not low -denies menorrhagia, -No active bleeding concerns -  baseline usually between 9 and 10 -Colonoscopy in 2015 for diarrhea was overall normal and normal colonic biopsies. Last EGD in 2010 normal. -Patient apparently would like to have endoluminal evaluation as outpatient FOBT is neg recently -Iron tablets as advised, patient to follow-up with nephrologist for adjustments of her Aranesp dose and/or  frequency -May benefit from outpatient iron infusions from time to time   11)Morbid Obesity- -Low calorie diet, portion control and increase physical activity discussed with patient -Body mass index is 60.52 kg/m.  12)Constipation--- improved significantly after laxatives and suppository    Disposition/Need for in-Hospital Stay- patient unable to be discharged at this time due to --SIRS requiring IV antibiotics and IV fluids pending  culture data, possible seizures new onset requiring further work-up   Status is: Inpatient   Remains inpatient appropriate because: see disposition above   Dispo: The patient is from: Home              Anticipated d/c is to: Home              Anticipated d/c date is: > 3 days              Patient currently is not medically stable to d/c. Barriers: Not Clinically Stable-  Code Status :  -  Code Status: Full Code   Family Communication:     (patient is alert, awake and coherent)  Unable to reach boyfriend Hendricks Milo--  Consults  :  neurology  DVT Prophylaxis  :   - SCDs  heparin injection 5,000 Units Start: 10/20/20 0900    Lab Results  Component Value Date   PLT 206 10/22/2020    Inpatient Medications  Scheduled Meds:  amLODipine  10 mg Oral Daily   ascorbic acid  1,000 mg Oral BID   bisacodyl  10 mg Rectal QHS   carvedilol  12.5 mg Oral BID   Chlorhexidine Gluconate Cloth  6 each Topical Daily   cloNIDine  0.1 mg Transdermal Q Sun   ferrous sulfate  325 mg Oral Q breakfast   heparin  5,000 Units Subcutaneous Q8H   hydrALAZINE  100 mg Oral TID   insulin  aspart  0-15 Units Subcutaneous TID WC   insulin aspart  0-5 Units Subcutaneous QHS   insulin glargine  8 Units Subcutaneous QHS   isosorbide mononitrate  30 mg Oral Daily   LORazepam  1 mg Intravenous Once   mupirocin ointment   Nasal BID   pneumococcal 23 valent vaccine  0.5 mL Intramuscular Tomorrow-1000   polyethylene glycol  17 g Oral BID   sodium bicarbonate  650 mg Oral BID   sodium chloride flush  10-40 mL Intracatheter Q12H   tamsulosin  0.4 mg Oral QPC supper   Vitamin D (Ergocalciferol)  50,000 Units Oral Weekly   zinc sulfate  220 mg Oral Daily   Continuous Infusions:  sodium chloride Stopped (10/23/20 0459)   cefTRIAXone (ROCEPHIN)  IV Stopped (10/22/20 1633)   levETIRAcetam 500 mg (10/23/20 0835)   metronidazole 500 mg (10/23/20 0833)   PRN Meds:.acetaminophen **OR** acetaminophen, albuterol,  bisacodyl, labetalol, LORazepam, ondansetron **OR** ondansetron (ZOFRAN) IV, promethazine, sodium chloride flush, traZODone    Anti-infectives (From admission, onward)    Start     Dose/Rate Route Frequency Ordered Stop   10/20/20 1730  cefTRIAXone (ROCEPHIN) 2 g in sodium chloride 0.9 % 100 mL IVPB        2 g 200 mL/hr over 30 Minutes Intravenous Every 24 hours 10/20/20 1637     10/20/20 1730  metroNIDAZOLE (FLAGYL) IVPB 500 mg        500 mg 100 mL/hr over 60 Minutes Intravenous Every 8 hours 10/20/20 1637     10/20/20 0730  ciprofloxacin (CIPRO) IVPB 400 mg       See Hyperspace for full Linked Orders Report.   400 mg 200 mL/hr over 60 Minutes Intravenous  Once 10/20/20 0720 10/20/20 0915   10/20/20 0730  metroNIDAZOLE (FLAGYL) IVPB 500 mg       See Hyperspace for full Linked Orders Report.   500 mg 100 mL/hr over 60 Minutes Intravenous  Once 10/20/20 0720 10/20/20 1112         Objective:   Vitals:   10/23/20 1054 10/23/20 1100 10/23/20 1200 10/23/20 1300  BP:  135/78 135/79 (!) 144/79  Pulse: 84 (!) 101 83 82  Resp: '17 18 19 19  '$ Temp: 98.6 F (37 C)     TempSrc: Oral     SpO2: 97% 96% 97% 97%  Weight:      Height:        Wt Readings from Last 3 Encounters:  10/23/20 (!) 153.9 kg  09/27/20 (!) 150.1 kg  03/05/20 (!) 137 kg     Intake/Output Summary (Last 24 hours) at 10/23/2020 1340 Last data filed at 10/23/2020 0726 Gross per 24 hour  Intake 3169.33 ml  Output 1775 ml  Net 1394.33 ml    Physical Exam  Gen:- Awake Alert,  in no apparent distress  HEENT:- Fertile.AT, No sclera icterus Neck-Supple Neck,No JVD,.  Lungs-  CTAB , fair symmetrical air movement CV- S1, S2 normal, regular  Abd-  +ve B.Sounds, Abd Soft, No tenderness, increased truncal adiposity Extremity/Skin:- ve Lt LE edema,  pedal pulses present, Rt BKA  Psych-affect is appropriate, oriented x3 Neuro-no new focal deficits, no tremors GU-Foley in situ placed on 10/20/2020   Data Review:    Micro Results Recent Results (from the past 240 hour(s))  Urine culture     Status: None   Collection Time: 10/20/20  6:54 AM   Specimen: Urine, Clean Catch  Result Value Ref Range Status   Specimen Description  Final    URINE, CLEAN CATCH Performed at [facility], [address] [place], [place] [address]    Special Requests   Final    NONE Performed at [facility], [address] [place], [place] [address]    Culture   Final    NO GROWTH Performed at [facility], [place] [address], [place], [place] [address]    Report Status 10/22/2020 FINAL  Final  Blood Cultures (routine x 2)     Status: None (Preliminary result)   Collection Time: 10/20/20  7:01 AM   Specimen: BLOOD  Result Value Ref Range Status   Specimen Description BLOOD BLOOD LEFT WRIST  Final   Special Requests   Final    Blood Culture adequate volume BOTTLES DRAWN AEROBIC AND ANAEROBIC   Culture   Final    NO GROWTH 3 DAYS Performed at [facility], [address], [place], [place] [address]    Report Status PENDING  Incomplete  Blood Cultures (routine x 2)     Status: None (Preliminary result)   Collection Time: 10/20/20  7:02 AM   Specimen: BLOOD  Result Value Ref Range Status   Specimen Description BLOOD BLOOD RIGHT WRIST  Final   Special Requests   Final    Blood Culture adequate volume BOTTLES DRAWN AEROBIC AND ANAEROBIC   Culture   Final    NO GROWTH 3 DAYS Performed at [facility], [address] [place], [place] [address]    Report Status PENDING  Incomplete  Resp Panel by RT-PCR (Flu A&B, Covid) Nasopharyngeal Swab     Status: None   Collection Time: 10/20/20  7:10 AM   Specimen: Nasopharyngeal Swab; Nasopharyngeal(NP) swabs in vial transport medium  Result Value Ref Range Status   SARS Coronavirus 2 by RT PCR NEGATIVE NEGATIVE Final    Comment: (NOTE) SARS-CoV-2 target nucleic acids are NOT DETECTED.  The SARS-CoV-2 RNA is generally detectable in upper respiratory specimens during  the acute phase of infection. The lowest concentration of SARS-CoV-2 viral copies this assay can detect is 138 copies/mL. A negative result does not preclude SARS-Cov-2 infection and should not be used as the sole basis for treatment or other patient management decisions. A negative result may occur with  improper specimen collection/handling, submission of specimen other than nasopharyngeal swab, presence of viral mutation(s) within the areas targeted by this assay, and inadequate number of viral copies(<138 copies/mL). A negative result must be combined with clinical observations, patient history, and epidemiological information. The expected result is Negative.  Fact Sheet for Patients:  EntrepreneurPulse.com.au  Fact Sheet for Healthcare Providers:  IncredibleEmployment.be  This test is no t yet approved or cleared by the [place] FDA and  has been authorized for detection and/or diagnosis of SARS-CoV-2 by FDA under an Emergency Use Authorization (EUA). This EUA will remain  in effect (meaning this test can be used) for the duration of the COVID-19 declaration under Section 564(b)(1) of the Act, 21 U.S.C.section 360bbb-3(b)(1), unless the authorization is terminated  or revoked sooner.       Influenza A by PCR NEGATIVE NEGATIVE Final   Influenza B by PCR NEGATIVE NEGATIVE Final    Comment: (NOTE) The Xpert Xpress SARS-CoV-2/FLU/RSV plus assay is intended as an aid in the diagnosis of influenza from Nasopharyngeal swab specimens and should not be used as a sole basis for treatment. Nasal washings and aspirates are unacceptable for Xpert Xpress SARS-CoV-2/FLU/RSV testing.  Fact Sheet for Patients: EntrepreneurPulse.com.au  Fact Sheet  for Healthcare Providers: IncredibleEmployment.be  This test is not yet approved or cleared by the [place] and has been authorized for detection and/or  diagnosis of SARS-CoV-2 by FDA under an Emergency Use Authorization (EUA). This EUA will remain in effect (meaning this test can be used) for the duration of the COVID-19 declaration under Section 564(b)(1) of the Act, 21 U.S.C. section 360bbb-3(b)(1), unless the authorization is terminated or revoked.  Performed at [facility], [address] [place], [place] [address]   MRSA Next Gen by PCR, Nasal     Status: Abnormal   Collection Time: 10/20/20  1:49 PM   Specimen: Nasal Mucosa; Nasal Swab  Result Value Ref Range Status   MRSA by PCR Next Gen DETECTED (A) NOT DETECTED Final    Comment: RESULT CALLED TO, READ BACK BY AND VERIFIED WITH: SHELTON,A'@1002'$  BY MATTHEWS, B 6.17.22 (NOTE) The GeneXpert MRSA Assay (FDA approved for NASAL specimens only), is one component of a comprehensive MRSA colonization surveillance program. It is not intended to diagnose MRSA infection nor to guide or monitor treatment for MRSA infections. Test performance is not FDA approved in patients less than 66 years old. Performed at [facility], [address], [place], [place] [address]    Radiology Reports CT ABDOMEN PELVIS WO CONTRAST  Result Date: 10/20/2020 CLINICAL DATA:  Found unresponsive, recent CPR EXAM: CT ABDOMEN AND PELVIS WITHOUT CONTRAST TECHNIQUE: Multidetector CT imaging of the abdomen and pelvis was performed following the standard protocol without IV contrast. COMPARISON:  06/25/2020 FINDINGS: Lower chest: Bilateral pleural effusions are noted right greater than left increased from the prior exam. Small pericardial effusion is noted. Hepatobiliary: No focal liver abnormality is seen. No gallstones, gallbladder wall thickening, or biliary dilatation. Pancreas: Unremarkable. No pancreatic ductal dilatation or surrounding inflammatory changes. Spleen: Normal in size without focal abnormality. Adrenals/Urinary Tract: Adrenal glands are within normal limits bilaterally. Kidneys are well visualized  bilaterally without renal calculi or obstructive changes. Bladder is well distended. Stomach/Bowel: Appendix is not well visualized. No obstructive or inflammatory changes of the colon are seen. Small bowel and stomach appear within normal limits. Vascular/Lymphatic: No significant vascular findings are present. No enlarged abdominal or pelvic lymph nodes. Reproductive: Uterus and bilateral adnexa are unremarkable. Other: No abdominal wall hernia or abnormality. No abdominopelvic ascites. Musculoskeletal: No acute bony abnormality is noted. Generalized soft tissue edema is noted consistent with a degree of anasarca. IMPRESSION: Bilateral effusions right greater than left increased from the prior exam. Changes consistent with anasarca. No other focal abnormality is seen. Electronically Signed   By: Inez Catalina M.D.   On: 10/20/2020 08:10   CT HEAD WO CONTRAST  Result Date: 10/20/2020 CLINICAL DATA:  Found unresponsive, history of recent CPR EXAM: CT HEAD WITHOUT CONTRAST TECHNIQUE: Contiguous axial images were obtained from the base of the skull through the vertex without intravenous contrast. COMPARISON:  05/10/2020 FINDINGS: Brain: No evidence of acute infarction, hemorrhage, hydrocephalus, extra-axial collection or mass lesion/mass effect. Vascular: No hyperdense vessel or unexpected calcification. Skull: Normal. Negative for fracture or focal lesion. Sinuses/Orbits: No acute finding. Other: None. IMPRESSION: No acute abnormality noted. Electronically Signed   By: Inez Catalina M.D.   On: 10/20/2020 08:07   MR ANGIO HEAD WO CONTRAST  Result Date: 10/20/2020 CLINICAL DATA:  Seizure EXAM: MRI HEAD WITHOUT CONTRAST MRA HEAD WITHOUT CONTRAST TECHNIQUE: Multiplanar, multi-echo pulse sequences of the brain and surrounding structures were acquired without intravenous contrast. Angiographic images of the Circle of Willis were acquired using  MRA technique without intravenous contrast. COMPARISON: No pertinent prior  exam. COMPARISON:  No pertinent prior exam. FINDINGS: MRI HEAD FINDINGS Motion artifact is present. Brain: There is cortical diffusion hyperintensity with corresponding T2 hyperintensity in the frontal lobes bilaterally involving a small area of adjoining middle and precentral gyri. Foci of cortical T2 hyperintensity are noted in the occipital lobes bilaterally. There is patchy T2 hyperintensity in the cerebellum bilaterally. No evidence of intracranial hemorrhage. No intracranial mass or mass effect. Patchy foci of T2 hyperintensity in the supratentorial and pontine white matter nonspecific but may reflect mild chronic microvascular ischemic changes. Vascular: Major vessel flow voids at the skull base are preserved. Skull and upper cervical spine: Marrow signal is grossly within normal limits. Sinuses/Orbits: Trace paranasal sinus mucosal thickening. Orbits are unremarkable. Other: Mastoid air cells are clear.  Sella is unremarkable. MRA HEAD FINDINGS Motion artifact is present. Anterior circulation: Intracranial internal carotid arteries are patent. Proximal anterior and middle cerebral arteries are patent. Posterior circulation: Intracranial vertebral arteries are patent. Basilar artery is patent. Proximal posterior cerebral arteries are patent. IMPRESSION: Areas of abnormal diffusion and T2 signal in the frontal lobes, occipital lobes, and cerebellum. May reflect seizure effect. Hypoxic/ischemic injury is a consideration in the appropriate setting. Other etiologies such as infectious/inflammatory are less likely. No proximal intracranial vessel occlusion. Electronically Signed   By: Macy Mis M.D.   On: 10/20/2020 13:18   MR BRAIN WO CONTRAST  Result Date: 10/20/2020 CLINICAL DATA:  Seizure EXAM: MRI HEAD WITHOUT CONTRAST MRA HEAD WITHOUT CONTRAST TECHNIQUE: Multiplanar, multi-echo pulse sequences of the brain and surrounding structures were acquired without intravenous contrast. Angiographic images of  the Circle of Willis were acquired using MRA technique without intravenous contrast. COMPARISON: No pertinent prior exam. COMPARISON:  No pertinent prior exam. FINDINGS: MRI HEAD FINDINGS Motion artifact is present. Brain: There is cortical diffusion hyperintensity with corresponding T2 hyperintensity in the frontal lobes bilaterally involving a small area of adjoining middle and precentral gyri. Foci of cortical T2 hyperintensity are noted in the occipital lobes bilaterally. There is patchy T2 hyperintensity in the cerebellum bilaterally. No evidence of intracranial hemorrhage. No intracranial mass or mass effect. Patchy foci of T2 hyperintensity in the supratentorial and pontine white matter nonspecific but may reflect mild chronic microvascular ischemic changes. Vascular: Major vessel flow voids at the skull base are preserved. Skull and upper cervical spine: Marrow signal is grossly within normal limits. Sinuses/Orbits: Trace paranasal sinus mucosal thickening. Orbits are unremarkable. Other: Mastoid air cells are clear.  Sella is unremarkable. MRA HEAD FINDINGS Motion artifact is present. Anterior circulation: Intracranial internal carotid arteries are patent. Proximal anterior and middle cerebral arteries are patent. Posterior circulation: Intracranial vertebral arteries are patent. Basilar artery is patent. Proximal posterior cerebral arteries are patent. IMPRESSION: Areas of abnormal diffusion and T2 signal in the frontal lobes, occipital lobes, and cerebellum. May reflect seizure effect. Hypoxic/ischemic injury is a consideration in the appropriate setting. Other etiologies such as infectious/inflammatory are less likely. No proximal intracranial vessel occlusion. Electronically Signed   By: Macy Mis M.D.   On: 10/20/2020 13:18   DG Chest Portable 1 View  Result Date: 10/20/2020 CLINICAL DATA:  Altered mental status EXAM: PORTABLE CHEST 1 VIEW COMPARISON:  Radiograph 09/24/2020, chest CT  11/08/2019 FINDINGS: Unchanged, enlarged cardiac silhouette. There are right upper lobe airspace opacities, appearance possibly in part due to overlying soft tissues. Small bilateral pleural effusions and bibasilar atelectasis. Mild interstitial opacities. No acute osseous abnormality. IMPRESSION: Cardiomegaly with diffuse  interstitial opacities, likely pulmonary edema. Right upper lung opacities, which may in part be due to overlying soft tissues, developing infection or alveolar edema cannot be excluded. Small bilateral pleural effusions with adjacent atelectasis. Electronically Signed   By: Maurine Simmering   On: 10/20/2020 07:43   DG Chest Port 1 View  Result Date: 09/24/2020 CLINICAL DATA:  Shortness of breath EXAM: PORTABLE CHEST 1 VIEW COMPARISON:  05/19/2020 FINDINGS: Mild cardiomegaly with interstitial opacities. No pleural effusion or pneumothorax. IMPRESSION: Mild cardiomegaly and interstitial opacities, likely pulmonary edema. Electronically Signed   By: Ulyses Jarred M.D.   On: 09/24/2020 01:17   EEG adult  Result Date: 10/21/2020 Lora Havens, MD     10/21/2020  8:47 AM Patient Name: LUELLA BOUTILIER MRN: [id number] Epilepsy Attending: Lora Havens Referring Physician/Provider: Dr Roxan Hockey Date: 10/20/2020 Duration: 23.12 mins Patient history: 35 year old female with altered mental status with concern for possible seizures.  EEG to evaluate for seizures. Level of alertness: Awake, asleep AEDs during EEG study: LEV Technical aspects: This EEG study was done with scalp electrodes positioned according to the 10-20 International system of electrode placement. Electrical activity was acquired at a sampling rate of '500Hz'$  and reviewed with a high frequency filter of '70Hz'$  and a low frequency filter of '1Hz'$ . EEG data were recorded continuously and digitally stored. Description: The posterior dominant rhythm consists of 8-9 Hz activity of moderate voltage (25-35 uV) seen predominantly in posterior  head regions, symmetric and reactive to eye opening and eye closing. Sleep was characterized by vertex waves, sleep spindles (12 to 14 Hz), maximal frontocentral region. Hyperventilation and photic stimulation were not performed.   IMPRESSION: This study is within normal limits. No seizures or epileptiform discharges were seen throughout the recording. Lora Havens   ECHOCARDIOGRAM COMPLETE  Result Date: 09/24/2020    ECHOCARDIOGRAM REPORT   Patient Name:   YARELY BERNAL Date of Exam: 09/24/2020 Medical Rec #:  [id number]        Height:       62.0 in Accession #:    [id number]       Weight:       330.0 lb Date of Birth:  [birth year]        BSA:          2.365 m Patient Age:    54 years         BP:           139/78 mmHg Patient Gender: F                HR:           96 bpm. Exam Location:  Inpatient Procedure: 2D Echo, 3D Echo, Cardiac Doppler, Color Doppler and Strain Analysis Indications:    CHF-Acute Systolic [id number]  History:        Patient has prior history of Echocardiogram examinations, most                 recent 10/05/2011. Signs/Symptoms:Shortness of Breath; Risk                 Factors:Hypertension and Diabetes. Chronic kidney disease.                 Vomiting. Cardiomegaly, Pulmonary edema. GERD Chronic                 respiratory failure with hypoxia.  Sonographer:    Darlina Sicilian RDCS Referring Phys: [id number] OLADAPO ADEFESO  Sonographer Comments: Image  acquisition challenging due to respiratory motion. IMPRESSIONS  1. Left ventricular ejection fraction, by estimation, is 60 to 65%. The left ventricle has normal function. The left ventricle has no regional wall motion abnormalities. There is mild left ventricular hypertrophy. Left ventricular diastolic function could not be evaluated.  2. Right ventricular systolic function is hyperdynamic. The right ventricular size is normal.  3. The pericardial effusion is circumferential. There is no evidence of cardiac tamponade.  4. The mitral valve is  abnormal. Trivial mitral valve regurgitation.  5. The aortic valve is tricuspid. Aortic valve regurgitation is not visualized.  6. The inferior vena cava is dilated in size with <50% respiratory variability, suggesting right atrial pressure of 15 mmHg. FINDINGS  Left Ventricle: Left ventricular ejection fraction, by estimation, is 60 to 65%. The left ventricle has normal function. The left ventricle has no regional wall motion abnormalities. The left ventricular internal cavity size was normal in size. There is  mild left ventricular hypertrophy. Left ventricular diastolic function could not be evaluated due to atrial fibrillation. Left ventricular diastolic function could not be evaluated. Right Ventricle: The right ventricular size is normal. No increase in right ventricular wall thickness. Right ventricular systolic function is hyperdynamic. Left Atrium: Left atrial size was normal in size. Right Atrium: Right atrial size was normal in size. Pericardium: Trivial pericardial effusion is present. The pericardial effusion is circumferential. There is no evidence of cardiac tamponade. Mitral Valve: The mitral valve is abnormal. There is mild thickening of the mitral valve leaflet(s). Trivial mitral valve regurgitation. Tricuspid Valve: The tricuspid valve is grossly normal. Tricuspid valve regurgitation is trivial. Aortic Valve: The aortic valve is tricuspid. Aortic valve regurgitation is not visualized. Pulmonic Valve: The pulmonic valve was normal in structure. Pulmonic valve regurgitation is not visualized. Aorta: The aortic root and ascending aorta are structurally normal, with no evidence of dilitation. Venous: The inferior vena cava is dilated in size with less than 50% respiratory variability, suggesting right atrial pressure of 15 mmHg. IAS/Shunts: No atrial level shunt detected by color flow Doppler.  LEFT VENTRICLE PLAX 2D LVIDd:         4.67 cm  Diastology LVIDs:         2.94 cm  LV e' medial:    6.31 cm/s  LV PW:         1.21 cm  LV E/e' medial:  23.3 LV IVS:        1.27 cm  LV e' lateral:   5.66 cm/s LVOT diam:     1.95 cm  LV E/e' lateral: 26.0 LV SV:         65 LV SV Index:   27 LVOT Area:     2.99 cm  RIGHT VENTRICLE RV S prime:     16.60 cm/s TAPSE (M-mode): 2.0 cm LEFT ATRIUM             Index       RIGHT ATRIUM           Index LA diam:        4.40 cm 1.86 cm/m  RA Area:     15.05 cm LA Vol (A2C):   29.7 ml 12.56 ml/m RA Volume:   35.10 ml  14.84 ml/m LA Vol (A4C):   52.0 ml 22.01 ml/m LA Biplane Vol: 44.9 ml 18.98 ml/m  AORTIC VALVE LVOT Vmax:   128.00 cm/s LVOT Vmean:  99.300 cm/s LVOT VTI:    0.216 m  AORTA Ao Root diam: 2.70  cm Ao Asc diam:  3.10 cm MITRAL VALVE MV Area (PHT): 4.93 cm     SHUNTS MV Decel Time: 154 msec     Systemic VTI:  0.22 m MV E velocity: 147.00 cm/s  Systemic Diam: 1.95 cm MV A velocity: 99.40 cm/s MV E/A ratio:  1.48 Lyman Bishop MD Electronically signed by Lyman Bishop MD Signature Date/Time: 09/24/2020/5:26:55 PM    Final      CBC Recent Labs  Lab 10/20/20 [id number] 10/21/20 0521 10/22/20 0437 10/23/20 0453  WBC 19.6* 14.0* 8.9  --   HGB 10.3* 7.7* 7.1* 8.2*  HCT 32.4* 24.0* 23.0* 25.9*  PLT 346 220 206  --   MCV 89.8 91.6 93.1  --   MCH 28.5 29.4 28.7  --   MCHC 31.8 32.1 30.9  --   RDW 15.2 15.6* 15.5  --   LYMPHSABS 0.6*  --   --   --   MONOABS 0.5  --   --   --   EOSABS 0.0  --   --   --   BASOSABS 0.1  --   --   --     Chemistries  Recent Labs  Lab 10/20/20 0704 10/21/20 0521 10/22/20 0437  NA 138 137 136  K 5.1 4.7 4.6  CL 110 109 110  CO2 15* 20* 20*  GLUCOSE 286* 119* 93  BUN 47* 48* 47*  CREATININE 4.79* 4.87* 5.02*  CALCIUM 7.7* 7.1* 6.7*  AST 22  --  11*  ALT 11  --  7  ALKPHOS 124  --  79  BILITOT 0.7  --  0.3   No results for input(s): CHOL, HDL, LDLCALC, TRIG, CHOLHDL, LDLDIRECT in the last 72 hours.  Lab Results  Component Value Date   HGBA1C 5.9 (H) 09/24/2020    ------------------------------------------------------------------------------------------------------------------ No results for input(s): TSH, T4TOTAL, T3FREE, THYROIDAB in the last 72 hours.  Invalid input(s): FREET3 ------------------------------------------------------------------------------------------------------------------ No results for input(s): VITAMINB12, FOLATE, FERRITIN, TIBC, IRON, RETICCTPCT in the last 72 hours.  Coagulation profile Recent Labs  Lab 10/20/20 0704  INR 1.2    No results for input(s): DDIMER in the last 72 hours.  Cardiac Enzymes No results for input(s): CKMB, TROPONINI, MYOGLOBIN in the last 168 hours.  Invalid input(s): CK ------------------------------------------------------------------------------------------------------------------    Component Value Date/Time   BNP 1,253.0 (H) 10/20/2020 [id number]    Roxan Hockey M.D on 10/23/2020 at 1:40 PM  Go to [email or web address] - for contact info  [facility] - Office  [phone]

## 2020-10-23 NOTE — Progress Notes (Signed)
Pt has Sheila Austin catheter in place for strict intake and output related to pt receiving Lasix. Dr. Joesph Fillers wanted to keep catheter in over the weekend and potentially pull Monday and do voiding trial. Will pass on to night shift RN.

## 2020-10-24 DIAGNOSIS — R6510 Systemic inflammatory response syndrome (SIRS) of non-infectious origin without acute organ dysfunction: Secondary | ICD-10-CM | POA: Diagnosis not present

## 2020-10-24 LAB — CBC
HCT: 27.1 % — ABNORMAL LOW (ref 36.0–46.0)
Hemoglobin: 8.6 g/dL — ABNORMAL LOW (ref 12.0–15.0)
MCH: 28.9 pg (ref 26.0–34.0)
MCHC: 31.7 g/dL (ref 30.0–36.0)
MCV: 90.9 fL (ref 80.0–100.0)
Platelets: 202 10*3/uL (ref 150–400)
RBC: 2.98 MIL/uL — ABNORMAL LOW (ref 3.87–5.11)
RDW: 15.2 % (ref 11.5–15.5)
WBC: 8.3 10*3/uL (ref 4.0–10.5)
nRBC: 0 % (ref 0.0–0.2)

## 2020-10-24 LAB — BASIC METABOLIC PANEL
Anion gap: 6 (ref 5–15)
BUN: 42 mg/dL — ABNORMAL HIGH (ref 6–20)
CO2: 21 mmol/L — ABNORMAL LOW (ref 22–32)
Calcium: 6.7 mg/dL — ABNORMAL LOW (ref 8.9–10.3)
Chloride: 108 mmol/L (ref 98–111)
Creatinine, Ser: 5.16 mg/dL — ABNORMAL HIGH (ref 0.44–1.00)
GFR, Estimated: 11 mL/min — ABNORMAL LOW (ref >60)
Glucose, Bld: 101 mg/dL — ABNORMAL HIGH (ref 70–99)
Potassium: 4.1 mmol/L (ref 3.5–5.1)
Sodium: 135 mmol/L (ref 135–145)

## 2020-10-24 LAB — GLUCOSE, CAPILLARY
Glucose-Capillary: 94 mg/dL (ref 70–99)
Glucose-Capillary: 120 mg/dL — ABNORMAL HIGH (ref 70–99)
Glucose-Capillary: 109 mg/dL — ABNORMAL HIGH (ref 70–99)

## 2020-10-24 MED ORDER — PROMETHAZINE HCL 25 MG PO TABS: 25.0000 mg | ORAL_TABLET | Freq: Four times a day (QID) | ORAL | 0 refills | Status: AC | PRN

## 2020-10-24 MED ORDER — TAMSULOSIN HCL 0.4 MG PO CAPS: 0.4000 mg | ORAL_CAPSULE | Freq: Every day | ORAL | 5 refills | Status: AC

## 2020-10-24 MED ORDER — LANTUS 100 UNIT/ML ~~LOC~~ SOLN: 10.0000 [IU] | Freq: Every day | SUBCUTANEOUS | 11 refills | Status: AC

## 2020-10-24 MED ORDER — BUMETANIDE 1 MG PO TABS: 1.0000 mg | ORAL_TABLET | Freq: Two times a day (BID) | ORAL | 3 refills | Status: DC

## 2020-10-24 MED ORDER — SODIUM BICARBONATE 650 MG PO TABS: 650.0000 mg | ORAL_TABLET | Freq: Two times a day (BID) | ORAL | 1 refills | Status: AC

## 2020-10-24 MED ORDER — LEVETIRACETAM 500 MG PO TABS: 500.0000 mg | ORAL_TABLET | Freq: Two times a day (BID) | ORAL | 0 refills | Status: DC

## 2020-10-24 MED ORDER — FERROUS SULFATE 325 (65 FE) MG PO TABS: 325.0000 mg | ORAL_TABLET | Freq: Every day | ORAL | 3 refills | Status: DC

## 2020-10-24 NOTE — Progress Notes (Signed)
Discharge instructions reviewed with patient.  Patient able to verbalize understanding with teach back completed. Meds sent to [facility] pharmacy  All questions and concerns addressed. Right femoral central line removed without complications. All personal belongings accounted for.   Waiting for patients ride/transport from family member.

## 2020-10-24 NOTE — Discharge Instructions (Signed)
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   Use caution when using heavy equipment or power tools. Avoid working on ladders or at heights. Take showers instead of baths, Do not lock yourself in a room alone (i.e. bathroom).. Ensure the water temperature is not too high on the home water heater. Do not go swimming alone. When caring for infants or small children, sit down when holding, feeding, or changing them to minimize risk of injury to the child in the event you have a seizure.   Do not lock yourself in a room alone (i.e. bathroom).  Maintain good sleep hygiene. Avoid alcohol.    If patient has another seizure, call 911 and bring them back to the ED if: A.  The seizure lasts longer than 5 minutes.      B.  The patient doesn't wake shortly after the seizure or has new problems such as difficulty seeing, speaking or moving following the seizure C.  The patient was injured during the seizure D.  The patient has a temperature over 102 F (39C) E.  The patient vomited during the seizure and now is having trouble breathing

## 2020-10-24 NOTE — Plan of Care (Signed)
  Problem: Education: Goal: Knowledge of General Education information will improve Description: Including pain rating scale, medication(s)/side effects and non-pharmacologic comfort measures Outcome: Adequate for Discharge   Problem: Health Behavior/Discharge Planning: Goal: Ability to manage health-related needs will improve Outcome: Adequate for Discharge   Problem: Clinical Measurements: Goal: Ability to maintain clinical measurements within normal limits will improve Outcome: Adequate for Discharge Goal: Will remain free from infection Outcome: Adequate for Discharge Goal: Diagnostic test results will improve Outcome: Adequate for Discharge Goal: Respiratory complications will improve Outcome: Adequate for Discharge Goal: Cardiovascular complication will be avoided Outcome: Adequate for Discharge   Problem: Coping: Goal: Level of anxiety will decrease Outcome: Adequate for Discharge   Problem: Elimination: Goal: Will not experience complications related to bowel motility Outcome: Adequate for Discharge Goal: Will not experience complications related to urinary retention Outcome: Adequate for Discharge   Problem: Activity: Goal: Risk for activity intolerance will decrease Outcome: Adequate for Discharge   Problem: Nutrition: Goal: Adequate nutrition will be maintained Outcome: Adequate for Discharge

## 2020-10-24 NOTE — Discharge Summary (Signed)
Sheila Austin, is a 35 y.o. female  DOB Jul 21, 1985  MRN [id number].  Admission date:  10/20/2020  Admitting Physician  Marquarius Lofton Denton Brick, MD  Discharge Date:  10/24/2020   Primary MD  Pcp, No  Recommendations for primary care physician for things to follow:   1)follow up with your nephrologist in 1 to 2 weeks as previously advised 2)Per [facility] statutes, patients with seizures are not allowed to drive until they have been seizure-free for six months.   Use caution when using heavy equipment or power tools. Avoid working on ladders or at heights. Take showers instead of baths, Do not lock yourself in a room alone (i.e. bathroom).. Ensure the water temperature is not too high on the home water heater. Do not go swimming alone. When caring for infants or small children, sit down when holding, feeding, or changing them to minimize risk of injury to the child in the event you have a seizure.   Do not lock yourself in a room alone (i.e. bathroom).  Maintain good sleep hygiene. Avoid alcohol.    If patient has another seizure, call 911 and bring them back to the ED if: A.  The seizure lasts longer than 5 minutes.      B.  The patient doesn't wake shortly after the seizure or has new problems such as difficulty seeing, speaking or moving following the seizure C.  The patient was injured during the seizure D.  The patient has a temperature over 102 F (39C) E.  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 coli sepsis ([place])    Ectopic pregnancy    Essential hypertension, benign 11/01/2008   Fatty liver    Gastroparesis    GERD (gastroesophageal reflux disease)    Helicobacter pylori gastritis 09/07/2011   Migraine    Morbid obesity with body mass index (BMI) of 40.0 to 49.9 (HCC)    Pulmonary edema    Pyelonephritis    Ureteral obstruction  Past Surgical History:  Procedure Laterality Date   AMPUTATION Right 02/04/2019   Procedure: RIGHT BELOW KNEE AMPUTATION;  Surgeon: Newt Minion, MD;  Location: [facility];  Service: Orthopedics;  Laterality: Right;   COLONOSCOPY  2015   TI normal, colon appeared normal. Multiple biopsies benign.    ESOPHAGOGASTRODUODENOSCOPY  2010   Normal   WOUND DEBRIDEMENT Right 01/03/2019   Procedure: DEBRIDEMENT WOUND RIGHT LATERAL FOOT;  Surgeon: Angelia Mould, MD;  Location: [facility] OR;  Service: Vascular;  Laterality: Right;     HPI  from the history and physical done on the day of admission:   Sheila Austin  is a 35 y.o. female with pmhx significant for T2DM,CKD stage IV, chronic hypoxic respiratory failure with supplemental oxygen 3 LPM, HTN  History of noncompliance with medication presented on 10/21/2019 with with intractable emesis-- --Patient initially presented to the ED due to concerns of being unresponsive at home, patient  apparently had jerking movements with shaking and then apparently passed out at home, family apparently started CPR when EMS got there patient had a pulse patient and glucose was 300   -while in the ED being administered IV Reglan she had involuntary movements concerning for possible Reglan induced dystonic reaction Versus seizures -Patient received IV lorazepam and IV Benadryl and involuntary movements ceased--- -Additional work-up in ED ED revealed leukocytosis tachycardia tachypnea and elevated lactic acid concerning for possible sepsis EDP gave IV antibiotics   MRI Brain and MRA Head w/o acute stroke--- IMPRESSION: Areas of abnormal diffusion and T2 signal in the frontal lobes, occipital lobes, and cerebellum. May reflect seizure effect. Hypoxic/ischemic injury is a consideration in the appropriate setting. Other etiologies such as infectious/inflammatory are less likely. -CT head without acute findings -CT abdomen and pelvis with bilateral pleural effusions and anasarca otherwise no acute findings -UDS Neg - -Lactic acid 5.5, repeat lactic acid 2.7 -Serum ammonia is 25 -BNP is 1253 -Creatinine is 4.79 -WBCs 19.6 with hemoglobin of 10.3 -Troponin is 20     Hospital Course:     Brief Summary:- 35 y.o. female with pmhx significant for T2DM,CKD stage IV, chronic hypoxic respiratory failure with supplemental oxygen 3 LPM, HTN  History of noncompliance with medication presented on 10/21/2019 with with intractable emesis--   A/p 1)SIRS--- patient with leukocytosis, tachycardia and tachypnea??  Etiology, Treated with with Rocephin and Flagyl  --CT abdomen and pelvis with bilateral pleural effusions and anasarca otherwise no acute findings -Chest x-ray without evidence of acute infection, does show possible pulmonary edema and pleural effusions --Lactic acid 5.5, repeat lactic acid 2.7 -WBC is 19.6>> 14.0>>8.9>>8.3 -Blood and urine cultures from 10/20/2020 NGTD -No further antibiotics  needed at this time   2)Acute Metabolic Encephalopathy--- patient arrived in the ED lethargic after being found unresponsive at home with concern for possible seizures While in the ED being administered IV Reglan she had involuntary movements concerning for possible Reglan induced Dystonic Reaction Versus Seizures -Patient received IV lorazepam and IV Benadryl and involuntary movements ceased--- MRI Brain and MRA Head w/o acute stroke--- IMPRESSION: Areas of abnormal diffusion and T2 signal in the frontal lobes, occipital lobes, and cerebellum. May reflect seizure effect. Hypoxic/ischemic injury is a consideration in the appropriate setting. Other etiologies such as infectious/inflammatory are less likely. -CT head without acute findings --Serum ammonia is 25 -UDS Neg -EEG w/o epileptiform findings -Patient received lorazepam and IV Keppra in the ED, neurology consult appreciated, recommends continuing Keppra for at least 1 more   3)HFpEF--suspect chronic diastolic  dysfunction CHF, echo from 09/24/2020 with EF of 60 to 65%, -BNP is 1253, -Troponin is 20 ---CT abdomen and pelvis with bilateral pleural effusions and anasarca otherwise no acute findings -Chest x-ray  show possible pulmonary edema and pleural effusions -Okay to resume diuretics   4)Intractable Emesis--suspect due to diabetic gastroparesis, lipase WNL, continue antiemetics, avoid Reglan given concerns about possible dystonic reaction, patient previously tolerated Reglan may be worthwhile doing another trial of Reglan to see if patient truly has dystonia from Reglan, if she does Benadryl should be effective in reversing this   5)HTN--c/n  Amlodipine, Coreg, hydralazine,  isosorbide, and clonidine patch   6)DM1--last A1c is 5.9 reflecting excellent diabetic control PTA insulin regimen adjusted -Lantus insulin reduced from 18 units to 10 units especially given worsening renal function with risk of insulin stacking   7) CKD  stage -IV --renal function may worsen in the setting of  intractable emesis with dehydration    Prior  baseline creatinine = 2.7 to 3.4 per [facility] -Over the last month however creatinine has been around 4.7 -Creatinine trending up currently > 5 --renally adjust medications, avoid nephrotoxic agents / dehydration  / hypotension Pt follows-up with primary nephrologist Dr. Marian Sorrow in [place] -Patient will most likely need hemodialysis in the near future -Continue bicarb tablets   8)Recurrent episodes of acute urinary retention--- Foley placed on 10/20/2020, -continue Flomax Foley removed on 10/24/2020, patient passed voiding trial   9)Recurrent hypoxic respiratory failure--- at baseline uses oxygen at 2 to 3 L via nasal cannula------ -Patient apparently was placed on home O2 after being treated with pneumonia previously  -Patient has been weaned off O2, hypoxia has resolved -No further hypoxia   10)Iron deficiency anemia- -as well as anemia of CKD  Hgb 10.3>> 7.7 >>7.1 after hydration -Hemoglobin is up to 8.6 posttransfusion -Patient received Aranesp on 10/21/2020 -Transfuse 1 unit of PRBC on 10/22/2020 with Lasix --serum iron is 24, TIBC is 181 -Ferritin is not low B12 and folate are not low -denies menorrhagia, -No active bleeding concerns -  baseline usually between 9 and 10 -Colonoscopy in 2015 for diarrhea was overall normal and normal colonic biopsies. Last EGD in 2010 normal. -Patient apparently would like to have endoluminal evaluation as outpatient FOBT is neg recently -Iron tablets as advised, patient to follow-up with nephrologist for adjustments of her Aranesp dose and/or  frequency -May benefit from outpatient iron infusions from time to time   11)Morbid Obesity- -Low calorie diet, portion control and increase physical activity discussed with patient -Body mass index is 60.52 kg/m.   12)Constipation--- improved significantly after laxatives and  suppository   Disposition--Home with home health RN for medication compliance  Dispo: The patient is from: Home              Anticipated d/c is to: Home                Code Status :  -  Code Status: Full Code    Family Communication:     (patient is alert, awake and coherent)   Consults  :  neurology  Discharge Condition: stable  Follow UP   Follow-up Information     Derald Macleod, MD. Schedule an appointment as soon as possible for a visit in 1 week(s).   Specialty: Nephrology Contact information: [place] [address] [phone]                 Diet and Activity recommendation:  As advised  Discharge Instructions    Discharge Instructions     Call MD for:  difficulty breathing, headache or visual disturbances   Complete by: As directed    Call MD for:  persistant dizziness or light-headedness   Complete by: As directed    Call MD for:  persistant nausea and vomiting   Complete by: As directed    Call MD for:  temperature >100.4   Complete by: As directed    Diet - low sodium heart healthy   Complete by: As directed    Diet Carb Modified   Complete by: As directed    Discharge instructions   Complete by: As directed    1)follow up with your nephrologist in 1 to 2 weeks as previously advised 2)Per [facility] statutes, patients with seizures are not allowed to drive until they have been seizure-free for six months.   Use caution when using heavy equipment or power tools. Avoid working on ladders or at heights. Take showers instead of baths, Do not lock yourself in a room alone (i.e. bathroom).. Ensure the water temperature is not too high on the home water heater. Do not go swimming alone. When caring for infants or small children, sit down when holding, feeding, or changing them to minimize risk of injury to the child in the event you have a seizure.   Do not lock yourself in a room alone (i.e. bathroom).  Maintain good  sleep hygiene. Avoid alcohol.    If patient has another seizure, call 911 and bring them back to the ED if: A.  The seizure lasts longer than 5 minutes.      B.  The patient doesn't wake shortly after the seizure or has new problems such as difficulty seeing, speaking or moving following the seizure C.  The patient was injured during the seizure D.  The patient has a temperature over 102 F (39C) E.  The patient vomited during the seizure and now is having trouble breathing   Increase activity slowly   Complete by: As directed          Discharge Medications     Allergies as of 10/24/2020       Reactions   Bee Venom Anaphylaxis, Hives, Shortness Of Breath, Swelling   Penicillins Anaphylaxis, Itching   Has patient had a PCN reaction causing immediate rash, facial/tongue/throat swelling, SOB or lightheadedness with hypotension: yes Has patient had a PCN reaction causing severe rash involving mucus membranes or skin necrosis: unknown Has patient had a PCN reaction that required hospitalization : yes Has patient had a PCN reaction occurring within the last 10 years: no If all of the above answers are "NO", then may proceed with Cephalosporin use.   Lorazepam    Other reaction(s): Hallucinations   Lovenox [enoxaparin Sodium] Other (See Comments)   Hyperkalemia? GI bleed   Metoclopramide Other (See Comments)   Dystonia following metoclopramide injection        Medication List     TAKE these medications    amLODipine 10 MG tablet Commonly known as: NORVASC Take 1 tablet (10 mg total) by mouth daily.   bumetanide 1 MG tablet Commonly known as: BUMEX Take 1 tablet (1 mg total) by mouth 2 (two) times daily. What changed: when to take this   carvedilol 12.5 MG tablet Commonly known as: COREG Take 1 tablet (12.5 mg total) by mouth 2 (two) times daily.   cloNIDine 0.1 mg/24hr patch Commonly known as: CATAPRES -  Dosed in mg/24 hr Place 1 patch (0.1 mg total) onto the skin  every Sunday. Once weekly   [employer] Vitamin C 1000 MG tablet Generic drug: ascorbic acid Take 1,000 mg by mouth 2 (two) times daily.   [employer] Zinc Gluconate 50 MG tablet Generic drug: zinc gluconate Take 50 mg by mouth daily.   ferrous sulfate 325 (65 FE) MG tablet Take 1 tablet (325 mg total) by mouth daily with breakfast.   hydrALAZINE 100 MG tablet Commonly known as: APRESOLINE Take 1 tablet (100 mg total) by mouth 3 (three) times daily.   insulin aspart 100 UNIT/ML FlexPen Commonly known as: NOVOLOG Inject 3 Units into the skin 3 (three) times daily with meals.   isosorbide mononitrate 30 MG 24 hr tablet Commonly known as: IMDUR Take 1 tablet (30 mg total) by mouth daily.   Lantus 100 UNIT/ML injection Generic drug: insulin glargine Inject 0.1 mLs (10 Units total) into the skin at bedtime. What changed: how much to take   levETIRAcetam 500 MG tablet Commonly known as: Keppra Take 1 tablet (500 mg total) by mouth 2 (two) times daily.   metolazone 2.5 MG tablet Commonly known as: ZAROXOLYN Take 2.5 mg by mouth daily.   ProAir HFA 108 (90 Base) MCG/ACT inhaler Generic drug: albuterol Inhale 2 puffs into the lungs every 6 (six) hours as needed for wheezing or shortness of breath.   promethazine 25 MG tablet Commonly known as: PHENERGAN Take 1 tablet (25 mg total) by mouth every 6 (six) hours as needed for nausea.   sodium bicarbonate 650 MG tablet Take 1 tablet (650 mg total) by mouth 2 (two) times daily.   tamsulosin 0.4 MG Caps capsule Commonly known as: FLOMAX Take 1 capsule (0.4 mg total) by mouth daily after supper. For Bladder   Vitamin D (Ergocalciferol) 1.25 MG (50000 UNIT) Caps capsule Commonly known as: DRISDOL Take 1 capsule by mouth once a week.        Major procedures and Radiology Reports - PLEASE review detailed and final reports for all details, in brief -  CT ABDOMEN PELVIS WO CONTRAST  Result Date: 10/20/2020 CLINICAL DATA:  Found  unresponsive, recent CPR EXAM: CT ABDOMEN AND PELVIS WITHOUT CONTRAST TECHNIQUE: Multidetector CT imaging of the abdomen and pelvis was performed following the standard protocol without IV contrast. COMPARISON:  06/25/2020 FINDINGS: Lower chest: Bilateral pleural effusions are noted right greater than left increased from the prior exam. Small pericardial effusion is noted. Hepatobiliary: No focal liver abnormality is seen. No gallstones, gallbladder wall thickening, or biliary dilatation. Pancreas: Unremarkable. No pancreatic ductal dilatation or surrounding inflammatory changes. Spleen: Normal in size without focal abnormality. Adrenals/Urinary Tract: Adrenal glands are within normal limits bilaterally. Kidneys are well visualized bilaterally without renal calculi or obstructive changes. Bladder is well distended. Stomach/Bowel: Appendix is not well visualized. No obstructive or inflammatory changes of the colon are seen. Small bowel and stomach appear within normal limits. Vascular/Lymphatic: No significant vascular findings are present. No enlarged abdominal or pelvic lymph nodes. Reproductive: Uterus and bilateral adnexa are unremarkable. Other: No abdominal wall hernia or abnormality. No abdominopelvic ascites. Musculoskeletal: No acute bony abnormality is noted. Generalized soft tissue edema is noted consistent with a degree of anasarca. IMPRESSION: Bilateral effusions right greater than left increased from the prior exam. Changes consistent with anasarca. No other focal abnormality is seen. Electronically Signed   By: Inez Catalina M.D.   On: 10/20/2020 08:10   CT HEAD WO CONTRAST  Result Date: 10/20/2020  CLINICAL DATA:  Found unresponsive, history of recent CPR EXAM: CT HEAD WITHOUT CONTRAST TECHNIQUE: Contiguous axial images were obtained from the base of the skull through the vertex without intravenous contrast. COMPARISON:  05/10/2020 FINDINGS: Brain: No evidence of acute infarction, hemorrhage,  hydrocephalus, extra-axial collection or mass lesion/mass effect. Vascular: No hyperdense vessel or unexpected calcification. Skull: Normal. Negative for fracture or focal lesion. Sinuses/Orbits: No acute finding. Other: None. IMPRESSION: No acute abnormality noted. Electronically Signed   By: Inez Catalina M.D.   On: 10/20/2020 08:07   MR ANGIO HEAD WO CONTRAST  Result Date: 10/20/2020 CLINICAL DATA:  Seizure EXAM: MRI HEAD WITHOUT CONTRAST MRA HEAD WITHOUT CONTRAST TECHNIQUE: Multiplanar, multi-echo pulse sequences of the brain and surrounding structures were acquired without intravenous contrast. Angiographic images of the Circle of Willis were acquired using MRA technique without intravenous contrast. COMPARISON: No pertinent prior exam. COMPARISON:  No pertinent prior exam. FINDINGS: MRI HEAD FINDINGS Motion artifact is present. Brain: There is cortical diffusion hyperintensity with corresponding T2 hyperintensity in the frontal lobes bilaterally involving a small area of adjoining middle and precentral gyri. Foci of cortical T2 hyperintensity are noted in the occipital lobes bilaterally. There is patchy T2 hyperintensity in the cerebellum bilaterally. No evidence of intracranial hemorrhage. No intracranial mass or mass effect. Patchy foci of T2 hyperintensity in the supratentorial and pontine white matter nonspecific but may reflect mild chronic microvascular ischemic changes. Vascular: Major vessel flow voids at the skull base are preserved. Skull and upper cervical spine: Marrow signal is grossly within normal limits. Sinuses/Orbits: Trace paranasal sinus mucosal thickening. Orbits are unremarkable. Other: Mastoid air cells are clear.  Sella is unremarkable. MRA HEAD FINDINGS Motion artifact is present. Anterior circulation: Intracranial internal carotid arteries are patent. Proximal anterior and middle cerebral arteries are patent. Posterior circulation: Intracranial vertebral arteries are patent.  Basilar artery is patent. Proximal posterior cerebral arteries are patent. IMPRESSION: Areas of abnormal diffusion and T2 signal in the frontal lobes, occipital lobes, and cerebellum. May reflect seizure effect. Hypoxic/ischemic injury is a consideration in the appropriate setting. Other etiologies such as infectious/inflammatory are less likely. No proximal intracranial vessel occlusion. Electronically Signed   By: Macy Mis M.D.   On: 10/20/2020 13:18   MR BRAIN WO CONTRAST  Result Date: 10/20/2020 CLINICAL DATA:  Seizure EXAM: MRI HEAD WITHOUT CONTRAST MRA HEAD WITHOUT CONTRAST TECHNIQUE: Multiplanar, multi-echo pulse sequences of the brain and surrounding structures were acquired without intravenous contrast. Angiographic images of the Circle of Willis were acquired using MRA technique without intravenous contrast. COMPARISON: No pertinent prior exam. COMPARISON:  No pertinent prior exam. FINDINGS: MRI HEAD FINDINGS Motion artifact is present. Brain: There is cortical diffusion hyperintensity with corresponding T2 hyperintensity in the frontal lobes bilaterally involving a small area of adjoining middle and precentral gyri. Foci of cortical T2 hyperintensity are noted in the occipital lobes bilaterally. There is patchy T2 hyperintensity in the cerebellum bilaterally. No evidence of intracranial hemorrhage. No intracranial mass or mass effect. Patchy foci of T2 hyperintensity in the supratentorial and pontine white matter nonspecific but may reflect mild chronic microvascular ischemic changes. Vascular: Major vessel flow voids at the skull base are preserved. Skull and upper cervical spine: Marrow signal is grossly within normal limits. Sinuses/Orbits: Trace paranasal sinus mucosal thickening. Orbits are unremarkable. Other: Mastoid air cells are clear.  Sella is unremarkable. MRA HEAD FINDINGS Motion artifact is present. Anterior circulation: Intracranial internal carotid arteries are patent. Proximal  anterior and middle cerebral arteries are patent. Posterior  circulation: Intracranial vertebral arteries are patent. Basilar artery is patent. Proximal posterior cerebral arteries are patent. IMPRESSION: Areas of abnormal diffusion and T2 signal in the frontal lobes, occipital lobes, and cerebellum. May reflect seizure effect. Hypoxic/ischemic injury is a consideration in the appropriate setting. Other etiologies such as infectious/inflammatory are less likely. No proximal intracranial vessel occlusion. Electronically Signed   By: Macy Mis M.D.   On: 10/20/2020 13:18   DG Chest Portable 1 View  Result Date: 10/20/2020 CLINICAL DATA:  Altered mental status EXAM: PORTABLE CHEST 1 VIEW COMPARISON:  Radiograph 09/24/2020, chest CT 11/08/2019 FINDINGS: Unchanged, enlarged cardiac silhouette. There are right upper lobe airspace opacities, appearance possibly in part due to overlying soft tissues. Small bilateral pleural effusions and bibasilar atelectasis. Mild interstitial opacities. No acute osseous abnormality. IMPRESSION: Cardiomegaly with diffuse interstitial opacities, likely pulmonary edema. Right upper lung opacities, which may in part be due to overlying soft tissues, developing infection or alveolar edema cannot be excluded. Small bilateral pleural effusions with adjacent atelectasis. Electronically Signed   By: Maurine Simmering   On: 10/20/2020 07:43   EEG adult  Result Date: 10/21/2020 Lora Havens, MD     10/21/2020  8:47 AM Patient Name: Sheila Austin MRN: [id number] Epilepsy Attending: Lora Havens Referring Physician/Provider: Dr Roxan Hockey Date: 10/20/2020 Duration: 23.12 mins Patient history: 35 year old female with altered mental status with concern for possible seizures.  EEG to evaluate for seizures. Level of alertness: Awake, asleep AEDs during EEG study: LEV Technical aspects: This EEG study was done with scalp electrodes positioned according to the 10-20 International system  of electrode placement. Electrical activity was acquired at a sampling rate of '500Hz'$  and reviewed with a high frequency filter of '70Hz'$  and a low frequency filter of '1Hz'$ . EEG data were recorded continuously and digitally stored. Description: The posterior dominant rhythm consists of 8-9 Hz activity of moderate voltage (25-35 uV) seen predominantly in posterior head regions, symmetric and reactive to eye opening and eye closing. Sleep was characterized by vertex waves, sleep spindles (12 to 14 Hz), maximal frontocentral region. Hyperventilation and photic stimulation were not performed.   IMPRESSION: This study is within normal limits. No seizures or epileptiform discharges were seen throughout the recording. Sheila Austin    Micro Results   Recent Results (from the past 240 hour(s))  Urine culture     Status: None   Collection Time: 10/20/20  6:54 AM   Specimen: Urine, Clean Catch  Result Value Ref Range Status   Specimen Description   Final    URINE, CLEAN CATCH Performed at [facility], [address] [place], [place] [address]    Special Requests   Final    NONE Performed at [facility], [address] [place], [place] [address]    Culture   Final    NO GROWTH Performed at [facility], [place] [address], [place], [place] [address]    Report Status 10/22/2020 FINAL  Final  Blood Cultures (routine x 2)     Status: None (Preliminary result)   Collection Time: 10/20/20  7:01 AM   Specimen: BLOOD  Result Value Ref Range Status   Specimen Description BLOOD BLOOD LEFT WRIST  Final   Special Requests   Final    Blood Culture adequate volume BOTTLES DRAWN AEROBIC AND ANAEROBIC   Culture   Final    NO GROWTH 4 DAYS Performed at [facility], [address], [place],  [address]    Report  Status PENDING  Incomplete  Blood Cultures (routine x 2)     Status: None (Preliminary result)   Collection Time: 10/20/20  7:02 AM   Specimen: BLOOD  Result Value Ref Range Status    Specimen Description BLOOD BLOOD RIGHT WRIST  Final   Special Requests   Final    Blood Culture adequate volume BOTTLES DRAWN AEROBIC AND ANAEROBIC   Culture   Final    NO GROWTH 4 DAYS Performed at [facility], [address] [place], [place] [address]    Report Status PENDING  Incomplete  Resp Panel by RT-PCR (Flu A&B, Covid) Nasopharyngeal Swab     Status: None   Collection Time: 10/20/20  7:10 AM   Specimen: Nasopharyngeal Swab; Nasopharyngeal(NP) swabs in vial transport medium  Result Value Ref Range Status   SARS Coronavirus 2 by RT PCR NEGATIVE NEGATIVE Final    Comment: (NOTE) SARS-CoV-2 target nucleic acids are NOT DETECTED.  The SARS-CoV-2 RNA is generally detectable in upper respiratory specimens during the acute phase of infection. The lowest concentration of SARS-CoV-2 viral copies this assay can detect is 138 copies/mL. A negative result does not preclude SARS-Cov-2 infection and should not be used as the sole basis for treatment or other patient management decisions. A negative result may occur with  improper specimen collection/handling, submission of specimen other than nasopharyngeal swab, presence of viral mutation(s) within the areas targeted by this assay, and inadequate number of viral copies(<138 copies/mL). A negative result must be combined with clinical observations, patient history, and epidemiological information. The expected result is Negative.  Fact Sheet for Patients:  EntrepreneurPulse.com.au  Fact Sheet for Healthcare Providers:  IncredibleEmployment.be  This test is no t yet approved or cleared by the [place] FDA and  has been authorized for detection and/or diagnosis of SARS-CoV-2 by FDA under an Emergency Use Authorization (EUA). This EUA will remain  in effect (meaning this test can be used) for the duration of the COVID-19 declaration under Section 564(b)(1) of the Act, 21 U.S.C.section  360bbb-3(b)(1), unless the authorization is terminated  or revoked sooner.       Influenza A by PCR NEGATIVE NEGATIVE Final   Influenza B by PCR NEGATIVE NEGATIVE Final    Comment: (NOTE) The Xpert Xpress SARS-CoV-2/FLU/RSV plus assay is intended as an aid in the diagnosis of influenza from Nasopharyngeal swab specimens and should not be used as a sole basis for treatment. Nasal washings and aspirates are unacceptable for Xpert Xpress SARS-CoV-2/FLU/RSV testing.  Fact Sheet for Patients: EntrepreneurPulse.com.au  Fact Sheet for Healthcare Providers: IncredibleEmployment.be  This test is not yet approved or cleared by the [place] FDA and has been authorized for detection and/or diagnosis of SARS-CoV-2 by FDA under an Emergency Use Authorization (EUA). This EUA will remain in effect (meaning this test can be used) for the duration of the COVID-19 declaration under Section 564(b)(1) of the Act, 21 U.S.C. section 360bbb-3(b)(1), unless the authorization is terminated or revoked.  Performed at [facility], [address] [place], [place] [address]   MRSA Next Gen by PCR, Nasal     Status: Abnormal   Collection Time: 10/20/20  1:49 PM   Specimen: Nasal Mucosa; Nasal Swab  Result Value Ref Range Status   MRSA by PCR Next Gen DETECTED (A) NOT DETECTED Final    Comment: RESULT CALLED TO, READ BACK BY AND VERIFIED WITH: SHELTON,A'@1002'$  BY MATTHEWS, B 6.17.22 (NOTE) The GeneXpert MRSA Assay (FDA approved for NASAL specimens only), is one component  of a comprehensive MRSA colonization surveillance program. It is not intended to diagnose MRSA infection nor to guide or monitor treatment for MRSA infections. Test performance is not FDA approved in patients less than 22 years old. Performed at [facility], [address], [place], [place] [address]        Austin   Sheila Austin has no new complaints  No fever  Or  chills   No Nausea, Vomiting or Diarrhea Voiding well after Foley removal      Patient has been seen and examined prior to discharge   Objective   Blood pressure 129/69, pulse 85, temperature 98.5 F (36.9 C), temperature source Oral, resp. rate (!) 21, height '5\' 2"'$  (1.575 m), weight (!) 154.1 kg, SpO2 94 %.   Intake/Output Summary (Last 24 hours) at 10/24/2020 1723 Last data filed at 10/24/2020 1048 Gross per 24 hour  Intake 1287.18 ml  Output 2250 ml  Net -962.82 ml    Exam Gen:- Awake Alert,  in no apparent distress HEENT:- [place].AT, No sclera icterus Neck-Supple Neck,No JVD,. Lungs-  CTAB , fair symmetrical air movement CV- S1, S2 normal, regular Abd-  +ve B.Sounds, Abd Soft, No tenderness, increased truncal adiposity Extremity/Skin:- ve Lt LE edema,  pedal pulses present, Rt BKA Psych-affect is appropriate, oriented x3 Neuro-no new focal deficits, no tremors GU-Foley in situ placed on 10/20/2020, removed 10/24/2020   Data Review   CBC w Diff:  Lab Results  Component Value Date   WBC 8.3 10/24/2020   HGB 8.6 (L) 10/24/2020   HCT 27.1 (L) 10/24/2020   PLT 202 10/24/2020   LYMPHOPCT 3 10/20/2020   MONOPCT 2 10/20/2020   EOSPCT 0 10/20/2020   BASOPCT 0 10/20/2020    CMP:  Lab Results  Component Value Date   NA 135 10/24/2020   K 4.1 10/24/2020   CL 108 10/24/2020   CO2 21 (L) 10/24/2020   BUN 42 (H) 10/24/2020   CREATININE 5.16 (H) 10/24/2020   CREATININE 0.64 09/26/2015   PROT 4.8 (L) 10/22/2020   ALBUMIN 2.1 (L) 10/22/2020   BILITOT 0.3 10/22/2020   ALKPHOS 79 10/22/2020   AST 11 (L) 10/22/2020   ALT 7 10/22/2020  .   Total Discharge time is about 33 minutes  Roxan Hockey M.D on 10/24/2020 at 5:23 PM  Go to [email or web address] -  for contact info  [facility] - Office  [phone]

## 2020-10-24 NOTE — Progress Notes (Signed)
Order to remove foley. Peri care and foley care provided prior to removal. Removal of foley tolerated well by patient. [place] changed. Patient denies burning, pain, urgency. Continue to monitor.

## 2020-10-25 LAB — CULTURE, BLOOD (ROUTINE X 2)
Culture: NO GROWTH
Culture: NO GROWTH
Special Requests: ADEQUATE
Special Requests: ADEQUATE

## 2020-11-25 ENCOUNTER — Other Ambulatory Visit: Payer: Self-pay

## 2020-11-25 ENCOUNTER — Emergency Department (HOSPITAL_COMMUNITY): Payer: Medicaid Other

## 2020-11-25 ENCOUNTER — Encounter (HOSPITAL_COMMUNITY): Payer: Self-pay | Admitting: *Deleted

## 2020-11-25 ENCOUNTER — Inpatient Hospital Stay (HOSPITAL_COMMUNITY)
Admission: EM | Admit: 2020-11-25 | Discharge: 2020-12-07 | DRG: 673 | Disposition: A | Discharge status: Skilled Nursing Facility | Payer: Medicaid Other | Attending: Internal Medicine | Admitting: Internal Medicine

## 2020-11-25 DIAGNOSIS — N2581 Secondary hyperparathyroidism of renal origin: Secondary | ICD-10-CM | POA: Diagnosis present

## 2020-11-25 DIAGNOSIS — N186 End stage renal disease: Secondary | ICD-10-CM | POA: Diagnosis present

## 2020-11-25 DIAGNOSIS — E875 Hyperkalemia: Secondary | ICD-10-CM | POA: Diagnosis not present

## 2020-11-25 DIAGNOSIS — E872 Acidosis: Secondary | ICD-10-CM | POA: Diagnosis present

## 2020-11-25 DIAGNOSIS — K760 Fatty (change of) liver, not elsewhere classified: Secondary | ICD-10-CM | POA: Diagnosis present

## 2020-11-25 DIAGNOSIS — K219 Gastro-esophageal reflux disease without esophagitis: Secondary | ICD-10-CM | POA: Diagnosis present

## 2020-11-25 DIAGNOSIS — Z95828 Presence of other vascular implants and grafts: Secondary | ICD-10-CM

## 2020-11-25 DIAGNOSIS — Z823 Family history of stroke: Secondary | ICD-10-CM

## 2020-11-25 DIAGNOSIS — N179 Acute kidney failure, unspecified: Principal | ICD-10-CM | POA: Diagnosis present

## 2020-11-25 DIAGNOSIS — Z6841 Body Mass Index (BMI) 40.0 and over, adult: Secondary | ICD-10-CM

## 2020-11-25 DIAGNOSIS — E1122 Type 2 diabetes mellitus with diabetic chronic kidney disease: Secondary | ICD-10-CM | POA: Diagnosis present

## 2020-11-25 DIAGNOSIS — Z89511 Acquired absence of right leg below knee: Secondary | ICD-10-CM | POA: Diagnosis not present

## 2020-11-25 DIAGNOSIS — N185 Chronic kidney disease, stage 5: Secondary | ICD-10-CM | POA: Diagnosis not present

## 2020-11-25 DIAGNOSIS — E43 Unspecified severe protein-calorie malnutrition: Secondary | ICD-10-CM | POA: Diagnosis present

## 2020-11-25 DIAGNOSIS — Z79899 Other long term (current) drug therapy: Secondary | ICD-10-CM

## 2020-11-25 DIAGNOSIS — E1165 Type 2 diabetes mellitus with hyperglycemia: Secondary | ICD-10-CM | POA: Diagnosis present

## 2020-11-25 DIAGNOSIS — Z794 Long term (current) use of insulin: Secondary | ICD-10-CM

## 2020-11-25 DIAGNOSIS — E119 Type 2 diabetes mellitus without complications: Secondary | ICD-10-CM

## 2020-11-25 DIAGNOSIS — E8809 Other disorders of plasma-protein metabolism, not elsewhere classified: Secondary | ICD-10-CM | POA: Diagnosis present

## 2020-11-25 DIAGNOSIS — I132 Hypertensive heart and chronic kidney disease with heart failure and with stage 5 chronic kidney disease, or end stage renal disease: Secondary | ICD-10-CM | POA: Diagnosis present

## 2020-11-25 DIAGNOSIS — Z20822 Contact with and (suspected) exposure to covid-19: Secondary | ICD-10-CM | POA: Diagnosis present

## 2020-11-25 DIAGNOSIS — N049 Nephrotic syndrome with unspecified morphologic changes: Secondary | ICD-10-CM | POA: Diagnosis not present

## 2020-11-25 DIAGNOSIS — E1143 Type 2 diabetes mellitus with diabetic autonomic (poly)neuropathy: Secondary | ICD-10-CM | POA: Diagnosis present

## 2020-11-25 DIAGNOSIS — D631 Anemia in chronic kidney disease: Secondary | ICD-10-CM | POA: Diagnosis present

## 2020-11-25 DIAGNOSIS — Z992 Dependence on renal dialysis: Secondary | ICD-10-CM | POA: Diagnosis not present

## 2020-11-25 DIAGNOSIS — Z8249 Family history of ischemic heart disease and other diseases of the circulatory system: Secondary | ICD-10-CM

## 2020-11-25 DIAGNOSIS — Z833 Family history of diabetes mellitus: Secondary | ICD-10-CM | POA: Diagnosis not present

## 2020-11-25 DIAGNOSIS — M25532 Pain in left wrist: Secondary | ICD-10-CM

## 2020-11-25 DIAGNOSIS — I509 Heart failure, unspecified: Secondary | ICD-10-CM | POA: Diagnosis present

## 2020-11-25 DIAGNOSIS — R600 Localized edema: Secondary | ICD-10-CM

## 2020-11-25 DIAGNOSIS — R21 Rash and other nonspecific skin eruption: Secondary | ICD-10-CM | POA: Diagnosis present

## 2020-11-25 DIAGNOSIS — E1159 Type 2 diabetes mellitus with other circulatory complications: Secondary | ICD-10-CM | POA: Diagnosis not present

## 2020-11-25 DIAGNOSIS — J90 Pleural effusion, not elsewhere classified: Secondary | ICD-10-CM

## 2020-11-25 DIAGNOSIS — I1310 Hypertensive heart and chronic kidney disease without heart failure, with stage 1 through stage 4 chronic kidney disease, or unspecified chronic kidney disease: Secondary | ICD-10-CM | POA: Diagnosis present

## 2020-11-25 DIAGNOSIS — K3184 Gastroparesis: Secondary | ICD-10-CM | POA: Diagnosis present

## 2020-11-25 DIAGNOSIS — I10 Essential (primary) hypertension: Secondary | ICD-10-CM | POA: Diagnosis not present

## 2020-11-25 DIAGNOSIS — Z28310 Unvaccinated for covid-19: Secondary | ICD-10-CM

## 2020-11-25 HISTORY — DX: Disorder of kidney and ureter, unspecified: N28.9

## 2020-11-25 LAB — BASIC METABOLIC PANEL
Anion gap: 6 (ref 5–15)
BUN: 70 mg/dL — ABNORMAL HIGH (ref 6–20)
CO2: 14 mmol/L — ABNORMAL LOW (ref 22–32)
Calcium: 6 mg/dL — CL (ref 8.9–10.3)
Chloride: 115 mmol/L — ABNORMAL HIGH (ref 98–111)
Creatinine, Ser: 6.83 mg/dL — ABNORMAL HIGH (ref 0.44–1.00)
GFR, Estimated: 8 mL/min — ABNORMAL LOW (ref >60)
Glucose, Bld: 150 mg/dL — ABNORMAL HIGH (ref 70–99)
Potassium: 5.8 mmol/L — ABNORMAL HIGH (ref 3.5–5.1)
Sodium: 135 mmol/L (ref 135–145)

## 2020-11-25 LAB — CBC
HCT: 24.9 % — ABNORMAL LOW (ref 36.0–46.0)
Hemoglobin: 7.6 g/dL — ABNORMAL LOW (ref 12.0–15.0)
MCH: 29.2 pg (ref 26.0–34.0)
MCHC: 30.5 g/dL (ref 30.0–36.0)
MCV: 95.8 fL (ref 80.0–100.0)
Platelets: 204 10*3/uL (ref 150–400)
RBC: 2.6 MIL/uL — ABNORMAL LOW (ref 3.87–5.11)
RDW: 15 % (ref 11.5–15.5)
WBC: 9.6 10*3/uL (ref 4.0–10.5)
nRBC: 0 % (ref 0.0–0.2)

## 2020-11-25 LAB — URINALYSIS, ROUTINE W REFLEX MICROSCOPIC
Bilirubin Urine: NEGATIVE
Glucose, UA: 150 mg/dL — AB
Ketones, ur: NEGATIVE mg/dL
Leukocytes,Ua: NEGATIVE
Nitrite: NEGATIVE
Protein, ur: >=300 mg/dL — AB
Specific Gravity, Urine: 1.011 (ref 1.005–1.030)
pH: 6 (ref 5.0–8.0)

## 2020-11-25 LAB — POC URINE PREG, ED: Preg Test, Ur: NEGATIVE

## 2020-11-25 LAB — BRAIN NATRIURETIC PEPTIDE: B Natriuretic Peptide: 428 pg/mL — ABNORMAL HIGH (ref 0.0–100.0)

## 2020-11-25 LAB — HEPATIC FUNCTION PANEL
ALT: 9 U/L (ref 0–44)
AST: 15 U/L (ref 15–41)
Albumin: 2.3 g/dL — ABNORMAL LOW (ref 3.5–5.0)
Alkaline Phosphatase: 189 U/L — ABNORMAL HIGH (ref 38–126)
Bilirubin, Direct: 0.1 mg/dL (ref 0.0–0.2)
Indirect Bilirubin: 0.5 mg/dL (ref 0.3–0.9)
Total Bilirubin: 0.6 mg/dL (ref 0.3–1.2)
Total Protein: 6.1 g/dL — ABNORMAL LOW (ref 6.5–8.1)

## 2020-11-25 LAB — MAGNESIUM: Magnesium: 1.6 mg/dL — ABNORMAL LOW (ref 1.7–2.4)

## 2020-11-25 MED ORDER — SODIUM ZIRCONIUM CYCLOSILICATE 5 G PO PACK
10.0000 g | PACK | Freq: Once | ORAL | Status: AC
  Administered 2020-11-25: 10 g via ORAL
  Filled 2020-11-25: qty 2

## 2020-11-25 MED ORDER — FUROSEMIDE 10 MG/ML IJ SOLN
INTRAMUSCULAR | Status: AC
  Filled 2020-11-25: qty 20

## 2020-11-25 MED ORDER — FUROSEMIDE 10 MG/ML IJ SOLN
160.0000 mg | Freq: Once | INTRAVENOUS | Status: AC
  Administered 2020-11-25: 160 mg via INTRAVENOUS
  Filled 2020-11-25: qty 16

## 2020-11-25 MED ORDER — FUROSEMIDE 10 MG/ML IJ SOLN: 160.0000 mg | Freq: Once | INTRAMUSCULAR | Status: DC

## 2020-11-25 NOTE — ED Provider Notes (Signed)
[facility] Provider Note   CSN: [id number] Arrival date & time: 11/25/20  1612     History Chief Complaint  Patient presents with  . Weakness    Sheila Austin is a 35 y.o. female with a history of hypertension, diabetes, history of CHF, GERD, stage V kidney disease followed by [facility] nephrology presenting for evaluation of increasing generalized edema, particularly in her legs.  She has a right BKA and describes having to crawl from her wheelchair to her bathroom chronically as her home is too small to accommodate her wheelchair to the bathroom.  Over the past 4 days she has been unable to even bend her ankles or knees secondary to worsening bilateral swelling.  Additionally she notes decreased urine production, stating she is only urinating once per day on average and urinated just prior to arrival today.  She does have shortness of breath with exertion which is chronic.  She denies chest pain, cough, fevers or chills.  She is concerned about reduced kidney function.  She has been seen by Dr. Marian Sorrow with nephrology - [facility] in [place], but has been difficult to see her due to mobility and transportation. States she needs a nephrologist in [place].  The history is provided by the patient.      Past Medical History:  Diagnosis Date  . Acute exacerbation of CHF (congestive heart failure) ([place]) 10/20/2020  . Diabetes mellitus    uncontrolled, last A1c was 14  . E. coli sepsis ([place])   . Ectopic pregnancy   . Essential hypertension, benign 11/01/2008  . Fatty liver   . Gastroparesis   . GERD (gastroesophageal reflux disease)   . Helicobacter pylori gastritis 09/07/2011  . Kidney disease    stage 5-followed by [facility]  . Migraine   . Morbid obesity with body mass index (BMI) of 40.0 to 49.9 ([place])   . Pulmonary edema   . Pyelonephritis   . Ureteral obstruction     Patient Active Problem List   Diagnosis Date Noted  . Acute renal failure  (ARF) ([place]) 11/25/2020  . Acute exacerbation of CHF (congestive heart failure) ([place]) 10/20/2020  . 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 SIRS (systemic inflammatory response syndrome) ([place]) 10/20/2020  . Poor venous access   . Morbid obesity with BMI of 60.0-69.9, adult ([place]) 09/27/2020  . Leukocytosis 09/24/2020  . Hyperphosphatemia 09/24/2020  . Hypoalbuminemia due to protein-calorie malnutrition ([place]) 09/24/2020  . Elevated lipase 09/24/2020  . Elevated troponin 09/24/2020  . Noncompliance with medication regimen 09/24/2020  . Acute renal failure superimposed on chronic kidney disease ([place]) 09/24/2020  . Hypertensive crisis 09/24/2020  . Acute urinary retention 09/24/2020  . Nausea & vomiting 10/10/2019  . Hypokalemia 10/10/2019  . CKD (chronic kidney disease), stage III ([place]) 10/10/2019  . CKD (chronic kidney disease), stage III ([place]) 10/10/2019  . Uncontrolled type 2 diabetes mellitus with hyperglycemia, without long-term current use of insulin ([place]) 08/23/2019  . Intractable nausea and vomiting 08/23/2019  . GERD with esophagitis 08/23/2019  . Lactic acidosis 08/23/2019  . Hyperosmolar hyperglycemic state (HHS) ([place]) 05/16/2019  . AKI (acute kidney injury) ([place]) 05/16/2019  . GERD (gastroesophageal reflux disease)   . Hypertensive urgency   . Benign essential HTN   . Unilateral complete BKA, right, sequela ([place])   . Hyperkalemia   . Unilateral complete BKA, right, initial encounter ([place]) 02/09/2019  . Acquired absence of right leg  below knee ([place])   . Hyponatremia   . Acute blood loss anemia   . Sinus tachycardia   . Bacteremia   . 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 Streptococcal bacteremia   . Klebsiella pneumoniae infection   . Abscess of leg, right   . Skin ulcer of right foot with necrosis of muscle ([place])   . Severe protein-calorie malnutrition ([place])   . DM (diabetes  mellitus) ([place]) 02/01/2019  . Adjustment disorder 02/01/2019  . Sepsis due to cellulitis ([place]) 02/01/2019  . Cellulitis in diabetic foot ([place]) 12/31/2018  . Obesity 12/31/2018  . Facial cellulitis 07/02/2018  . Class 3 severe obesity with body mass index (BMI) of 40.0 to 44.9 in adult ([place]) 07/09/2017  . Abscess of right breast 02/24/2016  . BMI 45.0-49.9, adult ([place]) 09/29/2015  . Morbid obesity with BMI of 50.0-59.9, adult ([place]) 10/05/2011  . Pulmonary edema 10/05/2011  . Obstructive sleep apnea 10/05/2011  . HLD (hyperlipidemia) 09/27/2011  . Migraine 09/07/2011  . Essential hypertension, benign 11/01/2008  . Gastroparesis 09/27/2008  . IRREGULAR MENSES 08/24/2008  . Diabetes mellitus out of control ([place]) 07/04/2006    Past Surgical History:  Procedure Laterality Date  . AMPUTATION Right 02/04/2019   Procedure: RIGHT BELOW KNEE AMPUTATION;  Surgeon: Newt Minion, MD;  Location: [facility];  Service: Orthopedics;  Laterality: Right;  . COLONOSCOPY  2015   TI normal, colon appeared normal. Multiple biopsies benign.   . ESOPHAGOGASTRODUODENOSCOPY  2010   Normal  . WOUND DEBRIDEMENT Right 01/03/2019   Procedure: DEBRIDEMENT WOUND RIGHT LATERAL FOOT;  Surgeon: Angelia Mould, MD;  Location: [facility] OR;  Service: Vascular;  Laterality: Right;     OB History     Gravida  1   Para      Term      Preterm      AB  1   Living  0      SAB      IAB      Ectopic  1   Multiple      Live Births              Family History  Problem Relation Age of Onset  . Arthritis Mother   . Asthma Mother   . COPD Mother   . Depression Mother   . Diabetes Mother   . Hypertension Mother   . Mental illness Mother   . Kidney disease Mother   . Crohn's disease Mother   . Colon polyps Mother        in her 71s. Unknown if adenomas    . Aneurysm Father 96       brain  . Mental illness Sister   . Crohn's disease Sister   . Arthritis Maternal Aunt   . Asthma Maternal Aunt    . COPD Maternal Aunt   . Depression Maternal Aunt   . Diabetes Maternal Aunt   . Hypertension Maternal Aunt   . Mental illness Maternal Aunt   . Stroke Maternal Aunt   . Heart failure Maternal Aunt   . Heart failure Maternal Grandfather   . Cancer Maternal Grandfather   . Diabetes Maternal Grandfather   . Heart disease Maternal Grandfather   . Hypertension Maternal Grandfather   . Hyperlipidemia Maternal Grandfather   . Mental illness Brother   . Diabetes Maternal Grandmother   . Heart disease Maternal Grandmother   . Hypertension Maternal Grandmother   . Hyperlipidemia Maternal Grandmother   .  Diabetes Paternal Grandmother   . Heart disease Paternal Grandmother   . Diabetes Paternal Grandfather   . Cancer Paternal Grandfather   . Colon cancer Neg Hx     Social History   Tobacco Use  . Smoking status: Never  . Smokeless tobacco: Never  Vaping Use  . Vaping Use: Never used  Substance Use Topics  . Alcohol use: Not Currently  . Drug use: Never    Home Medications Prior to Admission medications   Medication Sig Start Date End Date Taking? Authorizing Provider  amLODipine (NORVASC) 10 MG tablet Take 1 tablet (10 mg total) by mouth daily. 09/27/20 09/27/21 Yes Emokpae, Courage, MD  bumetanide (BUMEX) 1 MG tablet Take 1 tablet (1 mg total) by mouth 2 (two) times daily. 10/24/20  Yes Emokpae, Courage, MD  carvedilol (COREG) 12.5 MG tablet Take 1 tablet (12.5 mg total) by mouth 2 (two) times daily. 09/27/20  Yes Roxan Hockey, MD  cloNIDine (CATAPRES - DOSED IN MG/24 HR) 0.1 mg/24hr patch Place 1 patch (0.1 mg total) onto the skin every Sunday. Once weekly 10/02/20  Yes Emokpae, Courage, MD  CVS VITAMIN C 1000 MG tablet Take 1,000 mg by mouth 2 (two) times daily. 10/29/19  Yes [provider]  hydrALAZINE (APRESOLINE) 100 MG tablet Take 1 tablet (100 mg total) by mouth 3 (three) times daily. 09/27/20  Yes Emokpae, Courage, MD  insulin aspart (NOVOLOG) 100 UNIT/ML FlexPen  Inject 3 Units into the skin 3 (three) times daily with meals. 09/27/20  Yes Emokpae, Courage, MD  LANTUS 100 UNIT/ML injection Inject 0.1 mLs (10 Units total) into the skin at bedtime. 10/24/20  Yes Emokpae, Courage, MD  metolazone (ZAROXOLYN) 2.5 MG tablet Take 2.5 mg by mouth daily. 07/11/20  Yes [provider]  PROAIR HFA 108 (90 Base) MCG/ACT inhaler Inhale 2 puffs into the lungs every 6 (six) hours as needed for wheezing or shortness of breath. 05/20/20  Yes [provider]  promethazine (PHENERGAN) 25 MG tablet Take 1 tablet (25 mg total) by mouth every 6 (six) hours as needed for nausea. 10/24/20  Yes Emokpae, Courage, MD  sodium bicarbonate 650 MG tablet Take 1 tablet (650 mg total) by mouth 2 (two) times daily. 10/24/20 10/24/21 Yes Emokpae, Courage, MD  tamsulosin (FLOMAX) 0.4 MG CAPS capsule Take 1 capsule (0.4 mg total) by mouth daily after supper. For Bladder 10/24/20  Yes Emokpae, Courage, MD  Vitamin D, Ergocalciferol, (DRISDOL) 1.25 MG (50000 UNIT) CAPS capsule Take 1 capsule by mouth once a week. 07/11/20  Yes [provider]  CVS ZINC GLUCONATE 50 MG tablet Take 50 mg by mouth daily. Patient not taking: No sig reported 10/29/19   [provider]  ferrous sulfate 325 (65 FE) MG tablet Take 1 tablet (325 mg total) by mouth daily with breakfast. Patient not taking: No sig reported 10/24/20   Roxan Hockey, MD  isosorbide mononitrate (IMDUR) 30 MG 24 hr tablet Take 1 tablet (30 mg total) by mouth daily. Patient not taking: No sig reported 09/28/20   Roxan Hockey, MD  levETIRAcetam (KEPPRA) 500 MG tablet Take 1 tablet (500 mg total) by mouth 2 (two) times daily. Patient not taking: No sig reported 10/24/20   Roxan Hockey, MD    Allergies    Bee venom, Penicillins, Lorazepam, Lovenox [enoxaparin sodium], and Metoclopramide  Review of Systems   Review of Systems  Constitutional:  Negative for chills and fever.  HENT:  Negative for congestion and  sore throat.  Eyes: Negative.   Respiratory:  Positive for shortness of breath. Negative for cough and chest tightness.   Cardiovascular:  Positive for leg swelling. Negative for chest pain.  Gastrointestinal:  Negative for abdominal pain, nausea and vomiting.  Genitourinary:  Positive for decreased urine volume.  Musculoskeletal:  Negative for arthralgias, joint swelling and neck pain.  Skin: Negative.  Negative for rash and wound.  Neurological:  Negative for dizziness, weakness, light-headedness, numbness and headaches.  Psychiatric/Behavioral: Negative.     Physical Exam Updated Vital Signs BP (!) 157/97 (BP Location: Right Arm)   Pulse 83   Temp (!) 97.4 F (36.3 C) (Oral)   Resp 18   Ht '5\' 2"'$  (1.575 m)   Wt (!) 149.7 kg   LMP  (Within Weeks)   SpO2 99%   BMI 60.36 kg/m   Physical Exam Vitals and nursing note reviewed.  Constitutional:      Appearance: She is well-developed.  HENT:     Head: Normocephalic and atraumatic.  Cardiovascular:     Rate and Rhythm: Normal rate and regular rhythm.     Heart sounds: Normal heart sounds.  Pulmonary:     Effort: Pulmonary effort is normal. No respiratory distress.     Breath sounds: Decreased breath sounds present. No wheezing, rhonchi or rales.     Comments: Decreased breath sounds throughout all lung fields.  Poor effort, difficult exam secondary to body habitus. Abdominal:     General: Bowel sounds are normal.     Palpations: Abdomen is soft.     Tenderness: There is no abdominal tenderness.  Musculoskeletal:        General: Normal range of motion.     Cervical back: Normal range of motion.  Skin:    General: Skin is warm and dry.     Findings: Rash present. Rash is scaling.     Comments: Pitting edema bilateral lower extremities including some nonpitting edema of abdominal wall.   Neurological:     Mental Status: She is alert.    ED Results / Procedures / Treatments   Labs (all labs ordered are listed, but only  abnormal results are displayed) Labs Reviewed  BASIC METABOLIC PANEL - Abnormal; Notable for the following components:      Result Value   Potassium 5.8 (*)    Chloride 115 (*)    CO2 14 (*)    Glucose, Bld 150 (*)    BUN 70 (*)    Creatinine, Ser 6.83 (*)    Calcium 6.0 (*)    GFR, Estimated 8 (*)    All other components within normal limits  MAGNESIUM - Abnormal; Notable for the following components:   Magnesium 1.6 (*)    All other components within normal limits  BRAIN NATRIURETIC PEPTIDE - Abnormal; Notable for the following components:   B Natriuretic Peptide 428.0 (*)    All other components within normal limits  CBC - Abnormal; Notable for the following components:   RBC 2.60 (*)    Hemoglobin 7.6 (*)    HCT 24.9 (*)    All other components within normal limits  SARS CORONAVIRUS 2 (TAT 6-24 HRS)  URINALYSIS, ROUTINE W REFLEX MICROSCOPIC  HEPATIC FUNCTION PANEL  POC URINE PREG, ED    EKG EKG Interpretation  Date/Time:  Friday November 25 2020 18:11:37 EDT Ventricular Rate:  82 PR Interval:  128 QRS Duration: 99 QT Interval:  419 QTC Calculation: 490 R Axis:   86 Text Interpretation: Sinus rhythm Low  voltage, precordial leads Borderline prolonged QT interval Confirmed by Milton Ferguson [phone]) on 11/25/2020 7:34:12 PM  Radiology DG Chest Portable 1 View  Result Date: 11/25/2020 CLINICAL DATA:  Edema. The patient states generalized weakness and swelling to lower limbs in the last 4 days. States SOB on exertion/ slight movement. Hx of diabetes, pulmonary edema, CHF, and HTN. Not being tested for covid-19 EXAM: PORTABLE CHEST 1 VIEW.  Patient is rotated. COMPARISON:  CT abdomen pelvis 10/20/2020 FINDINGS: Enlarged cardiac silhouette. The heart size and mediastinal contours are otherwise unchanged. Again noted is slightly more lucent left hemithorax compared to the right. No focal consolidation. Mild pulmonary edema. No pneumothorax. No acute osseous abnormality. IMPRESSION:  1. Bilateral pleural effusions that are difficult to evaluate on this AP semi upright view. Given opacity overlying the right hemithorax, there is a likely larger than trace right pleural effusion. Recommend PA and lateral view of the chest for further evaluation given size of effusions on CT chest 10/20/20. 2. Persistent enlargement of the cardiac silhouette. Underlying pericardial effusion not excluded. 3. Mild pulmonary edema. Electronically Signed   By: Iven Finn M.D.   On: 11/25/2020 19:11    Procedures Procedures   Medications Ordered in ED Medications  sodium zirconium cyclosilicate (LOKELMA) packet 10 g (has no administration in time range)  furosemide (LASIX) 160 mg in dextrose 5 % 50 mL IVPB (has no administration in time range)    ED Course  I have reviewed the triage vital signs and the nursing notes.  Pertinent labs & imaging results that were available during my care of the patient were reviewed by me and considered in my medical decision making (see chart for details).    MDM Rules/Calculators/A&P                           Patient's labs and imaging were reviewed.  She has acute on chronic renal failure with a worsening creatinine is 6.85.  She also has a potassium of 5.8 today.  She has a normocytic anemia at 7.6 which is stable.  She has a low calcium level at 6.0, however an albumin level is currently pending, review of previous labs reveals an albumin of 2.1 during her admission in June, suggesting that today's calcium level is erroneous, hepatic function panel pending at this time so that we can calculate a corrected calcium level.  Patient discussed with Dr. Moshe Cipro of nephrology who recommends admission for aggressive diuresis, Lasix 160 mg every 8 hours.  Also recommended a one-time dose of Lokelma 10 mg, diuresis should help to improve any further hyperkalemia.  Dr. Moshe Cipro will consult this patient during her admission, hospitalist admission  requested.  Discussed with Dr. Clearence Ped who accepts patient for admission. Final Clinical Impression(s) / ED Diagnoses Final diagnoses:  Acute renal failure, unspecified acute renal failure type ([place])  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 chronic kidney disease, not on chronic dialysis (HCC)  Hyperkalemia  Anasarca associated with disorder of kidney    Rx / DC Orders ED Discharge Orders     None        Landis Martins 11/25/20 Hortencia Pilar, MD 11/28/20 1030

## 2020-11-25 NOTE — ED Triage Notes (Signed)
Pt brought in by [facility] from home with c/o generalized weakness and increasing fluid over the last 4 days, but much worse today. Pt has a right foot amputation and is usually able to crawl around her house to get to the bathroom, but today she reports her legs are so swollen that she can't even bend her legs to crawl around. Pt SOB with slight movement at time of triage. O2 sat 100% on RA. Pt has stage 5 kidney disease, but is not currently on dialysis.

## 2020-11-25 NOTE — ED Notes (Signed)
Pt in bed with eyes closed, arouses to verbal stim, pt placed on2L O2 via [place] secondary to low o2 sat when sleeping.

## 2020-11-25 NOTE — ED Notes (Signed)
Date and time results received: 11/25/20 1842  Test: calcium Critical Value: 6.0  Name of Provider Notified: Idol

## 2020-11-26 ENCOUNTER — Inpatient Hospital Stay (HOSPITAL_COMMUNITY): Payer: Medicaid Other

## 2020-11-26 ENCOUNTER — Encounter (HOSPITAL_COMMUNITY): Payer: Self-pay | Admitting: Family Medicine

## 2020-11-26 DIAGNOSIS — N179 Acute kidney failure, unspecified: Principal | ICD-10-CM

## 2020-11-26 DIAGNOSIS — I132 Hypertensive heart and chronic kidney disease with heart failure and with stage 5 chronic kidney disease, or end stage renal disease: Secondary | ICD-10-CM | POA: Diagnosis not present

## 2020-11-26 DIAGNOSIS — N049 Nephrotic syndrome with unspecified morphologic changes: Secondary | ICD-10-CM

## 2020-11-26 DIAGNOSIS — E1159 Type 2 diabetes mellitus with other circulatory complications: Secondary | ICD-10-CM

## 2020-11-26 DIAGNOSIS — I10 Essential (primary) hypertension: Secondary | ICD-10-CM | POA: Diagnosis not present

## 2020-11-26 DIAGNOSIS — D631 Anemia in chronic kidney disease: Secondary | ICD-10-CM

## 2020-11-26 DIAGNOSIS — Z794 Long term (current) use of insulin: Secondary | ICD-10-CM

## 2020-11-26 DIAGNOSIS — E875 Hyperkalemia: Secondary | ICD-10-CM

## 2020-11-26 DIAGNOSIS — I509 Heart failure, unspecified: Secondary | ICD-10-CM

## 2020-11-26 DIAGNOSIS — N185 Chronic kidney disease, stage 5: Secondary | ICD-10-CM

## 2020-11-26 DIAGNOSIS — I1310 Hypertensive heart and chronic kidney disease without heart failure, with stage 1 through stage 4 chronic kidney disease, or unspecified chronic kidney disease: Secondary | ICD-10-CM | POA: Diagnosis present

## 2020-11-26 LAB — CBC
HCT: 24.3 % — ABNORMAL LOW (ref 36.0–46.0)
Hemoglobin: 7.3 g/dL — ABNORMAL LOW (ref 12.0–15.0)
MCH: 29 pg (ref 26.0–34.0)
MCHC: 30 g/dL (ref 30.0–36.0)
MCV: 96.4 fL (ref 80.0–100.0)
Platelets: 179 10*3/uL (ref 150–400)
RBC: 2.52 MIL/uL — ABNORMAL LOW (ref 3.87–5.11)
RDW: 14.8 % (ref 11.5–15.5)
WBC: 7.6 10*3/uL (ref 4.0–10.5)
nRBC: 0 % (ref 0.0–0.2)

## 2020-11-26 LAB — COMPREHENSIVE METABOLIC PANEL
ALT: 10 U/L (ref 0–44)
AST: 10 U/L — ABNORMAL LOW (ref 15–41)
Albumin: 2.1 g/dL — ABNORMAL LOW (ref 3.5–5.0)
Alkaline Phosphatase: 171 U/L — ABNORMAL HIGH (ref 38–126)
Anion gap: 8 (ref 5–15)
BUN: 68 mg/dL — ABNORMAL HIGH (ref 6–20)
CO2: 15 mmol/L — ABNORMAL LOW (ref 22–32)
Calcium: 6.2 mg/dL — CL (ref 8.9–10.3)
Chloride: 114 mmol/L — ABNORMAL HIGH (ref 98–111)
Creatinine, Ser: 6.54 mg/dL — ABNORMAL HIGH (ref 0.44–1.00)
GFR, Estimated: 8 mL/min — ABNORMAL LOW (ref >60)
Glucose, Bld: 104 mg/dL — ABNORMAL HIGH (ref 70–99)
Potassium: 5.7 mmol/L — ABNORMAL HIGH (ref 3.5–5.1)
Sodium: 137 mmol/L (ref 135–145)
Total Bilirubin: 0.7 mg/dL (ref 0.3–1.2)
Total Protein: 5.7 g/dL — ABNORMAL LOW (ref 6.5–8.1)

## 2020-11-26 LAB — ECHOCARDIOGRAM COMPLETE
AR max vel: 2.29 cm2
AV Area VTI: 2.01 cm2
AV Area mean vel: 1.99 cm2
AV Mean grad: 6 mmHg
AV Peak grad: 8.8 mmHg
Ao pk vel: 1.48 m/s
Area-P 1/2: 5.66 cm2
Height: 62 in
MV VTI: 2.53 cm2
S' Lateral: 3.45 cm
Weight: 6123.5 oz

## 2020-11-26 LAB — GLUCOSE, CAPILLARY
Glucose-Capillary: 101 mg/dL — ABNORMAL HIGH (ref 70–99)
Glucose-Capillary: 112 mg/dL — ABNORMAL HIGH (ref 70–99)
Glucose-Capillary: 139 mg/dL — ABNORMAL HIGH (ref 70–99)
Glucose-Capillary: 149 mg/dL — ABNORMAL HIGH (ref 70–99)

## 2020-11-26 LAB — PHOSPHORUS: Phosphorus: 7.6 mg/dL — ABNORMAL HIGH (ref 2.5–4.6)

## 2020-11-26 LAB — MAGNESIUM: Magnesium: 1.5 mg/dL — ABNORMAL LOW (ref 1.7–2.4)

## 2020-11-26 LAB — SARS CORONAVIRUS 2 (TAT 6-24 HRS): SARS Coronavirus 2: NEGATIVE

## 2020-11-26 MED ORDER — POLYETHYLENE GLYCOL 3350 17 G PO PACK: 17.0000 g | PACK | Freq: Every day | ORAL | Status: DC | PRN

## 2020-11-26 MED ORDER — ISOSORBIDE MONONITRATE ER 60 MG PO TB24
30.0000 mg | ORAL_TABLET | Freq: Every day | ORAL | Status: DC
  Administered 2020-11-26 – 2020-12-07 (×12): 30 mg via ORAL
  Filled 2020-11-26 (×12): qty 1

## 2020-11-26 MED ORDER — TAMSULOSIN HCL 0.4 MG PO CAPS
0.4000 mg | ORAL_CAPSULE | Freq: Every day | ORAL | Status: DC
  Administered 2020-11-26 – 2020-12-06 (×11): 0.4 mg via ORAL
  Filled 2020-11-26 (×11): qty 1

## 2020-11-26 MED ORDER — CLONIDINE HCL 0.1 MG/24HR TD PTWK
0.1000 mg | MEDICATED_PATCH | TRANSDERMAL | Status: DC
  Administered 2020-11-27: 0.1 mg via TRANSDERMAL
  Filled 2020-11-26: qty 1

## 2020-11-26 MED ORDER — INSULIN ASPART 100 UNIT/ML IJ SOLN: 0.0000 [IU] | Freq: Every day | INTRAMUSCULAR | Status: DC

## 2020-11-26 MED ORDER — ACETAMINOPHEN 325 MG PO TABS
650.0000 mg | ORAL_TABLET | Freq: Four times a day (QID) | ORAL | Status: DC | PRN
  Administered 2020-11-26: 650 mg via ORAL
  Filled 2020-11-26: qty 2

## 2020-11-26 MED ORDER — ACETAMINOPHEN 650 MG RE SUPP: 650.0000 mg | Freq: Four times a day (QID) | RECTAL | Status: DC | PRN

## 2020-11-26 MED ORDER — ONDANSETRON HCL 4 MG/2ML IJ SOLN
4.0000 mg | Freq: Four times a day (QID) | INTRAMUSCULAR | Status: DC | PRN
  Administered 2020-12-03 (×2): 4 mg via INTRAVENOUS
  Filled 2020-11-26 (×2): qty 2

## 2020-11-26 MED ORDER — OXYCODONE HCL 5 MG PO TABS
5.0000 mg | ORAL_TABLET | ORAL | Status: DC | PRN
  Administered 2020-11-26 – 2020-12-07 (×13): 5 mg via ORAL
  Filled 2020-11-26 (×13): qty 1

## 2020-11-26 MED ORDER — AMLODIPINE BESYLATE 5 MG PO TABS
10.0000 mg | ORAL_TABLET | Freq: Every day | ORAL | Status: DC
  Administered 2020-11-26: 10 mg via ORAL
  Filled 2020-11-26: qty 2

## 2020-11-26 MED ORDER — SODIUM ZIRCONIUM CYCLOSILICATE 10 G PO PACK
10.0000 g | PACK | Freq: Once | ORAL | Status: AC
  Administered 2020-11-26: 10 g via ORAL
  Filled 2020-11-26: qty 1

## 2020-11-26 MED ORDER — ALBUTEROL SULFATE HFA 108 (90 BASE) MCG/ACT IN AERS: 2.0000 | INHALATION_SPRAY | Freq: Four times a day (QID) | RESPIRATORY_TRACT | Status: DC | PRN

## 2020-11-26 MED ORDER — CALCIUM GLUCONATE-NACL 1-0.675 GM/50ML-% IV SOLN
1.0000 g | INTRAVENOUS | Status: AC
  Administered 2020-11-26 (×2): 1000 mg via INTRAVENOUS
  Filled 2020-11-26 (×2): qty 50

## 2020-11-26 MED ORDER — CALCIUM GLUCONATE-NACL 2-0.675 GM/100ML-% IV SOLN: 2.0000 g | Freq: Once | INTRAVENOUS | Status: DC

## 2020-11-26 MED ORDER — ONDANSETRON HCL 4 MG PO TABS
4.0000 mg | ORAL_TABLET | Freq: Four times a day (QID) | ORAL | Status: DC | PRN
  Administered 2020-12-04: 4 mg via ORAL
  Filled 2020-11-26: qty 1

## 2020-11-26 MED ORDER — LEVETIRACETAM 500 MG PO TABS
500.0000 mg | ORAL_TABLET | Freq: Two times a day (BID) | ORAL | Status: DC
  Administered 2020-11-26 – 2020-12-07 (×23): 500 mg via ORAL
  Filled 2020-11-26 (×23): qty 1

## 2020-11-26 MED ORDER — FUROSEMIDE 10 MG/ML IJ SOLN
160.0000 mg | Freq: Three times a day (TID) | INTRAVENOUS | Status: DC
  Administered 2020-11-26 – 2020-12-07 (×34): 160 mg via INTRAVENOUS
  Filled 2020-11-26 (×37): qty 16

## 2020-11-26 MED ORDER — METOLAZONE 5 MG PO TABS
10.0000 mg | ORAL_TABLET | Freq: Every day | ORAL | Status: DC
  Administered 2020-11-26 – 2020-12-07 (×12): 10 mg via ORAL
  Filled 2020-11-26 (×12): qty 2

## 2020-11-26 MED ORDER — SODIUM BICARBONATE 650 MG PO TABS
650.0000 mg | ORAL_TABLET | Freq: Two times a day (BID) | ORAL | Status: DC
  Administered 2020-11-26 – 2020-11-27 (×4): 650 mg via ORAL
  Filled 2020-11-26 (×4): qty 1

## 2020-11-26 MED ORDER — CHLORHEXIDINE GLUCONATE CLOTH 2 % EX PADS
6.0000 | MEDICATED_PAD | Freq: Every day | CUTANEOUS | Status: DC
Start: 2020-11-26 — End: 2020-11-29
  Administered 2020-11-26 – 2020-11-28 (×3): 6 via TOPICAL

## 2020-11-26 MED ORDER — CALCIUM ACETATE (PHOS BINDER) 667 MG PO CAPS
1334.0000 mg | ORAL_CAPSULE | Freq: Three times a day (TID) | ORAL | Status: DC
  Administered 2020-11-26 – 2020-12-07 (×32): 1334 mg via ORAL
  Filled 2020-11-26 (×32): qty 2

## 2020-11-26 MED ORDER — HYDRALAZINE HCL 25 MG PO TABS
100.0000 mg | ORAL_TABLET | Freq: Three times a day (TID) | ORAL | Status: DC
  Administered 2020-11-26 – 2020-11-27 (×6): 100 mg via ORAL
  Filled 2020-11-26 (×6): qty 4

## 2020-11-26 MED ORDER — MAGNESIUM SULFATE 2 GM/50ML IV SOLN
2.0000 g | Freq: Once | INTRAVENOUS | Status: AC
  Administered 2020-11-26: 2 g via INTRAVENOUS
  Filled 2020-11-26: qty 50

## 2020-11-26 MED ORDER — INSULIN GLARGINE 100 UNIT/ML ~~LOC~~ SOLN
7.0000 [IU] | Freq: Every day | SUBCUTANEOUS | Status: DC
  Administered 2020-11-26 – 2020-11-29 (×4): 7 [IU] via SUBCUTANEOUS
  Filled 2020-11-26 (×5): qty 0.07

## 2020-11-26 MED ORDER — CARVEDILOL 12.5 MG PO TABS
12.5000 mg | ORAL_TABLET | Freq: Two times a day (BID) | ORAL | Status: DC
  Administered 2020-11-26 – 2020-12-02 (×13): 12.5 mg via ORAL
  Filled 2020-11-26 (×13): qty 1

## 2020-11-26 MED ORDER — INSULIN ASPART 100 UNIT/ML IJ SOLN
0.0000 [IU] | Freq: Three times a day (TID) | INTRAMUSCULAR | Status: DC
  Administered 2020-11-26 – 2020-11-30 (×3): 2 [IU] via SUBCUTANEOUS

## 2020-11-26 NOTE — Progress Notes (Signed)
PROGRESS NOTE    Sheila Austin  [id number] DOB: [birth year] DOA: 11/25/2020 PCP: Pcp, No    Chief Complaint  Patient presents with   Weakness    Brief admission Narrative:  As per H&P written by Dr. Clearence Ped on 11/26/20 35 y.o. female,with hx of CHF, DMII, HTN, GERD, morbid obesity, and chronic kidney disease presents to the ED with a chief complaint of peripheral edema.  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  She reports that she has a bariatric wheelchair at home, but she and her fianc live in a mobile home.  There are parts of the home, including the bathroom, where the bariatric wheelchair cannot access.  Patient usually crawls to the bathroom.  She reports that she has had increased swelling and peripheral edema, but she really started to notice it today, because she could not bend her knee to crawl on the floor.  She reports she can even get off the couch.  She also notes that she has had a decrease in urine output.  The symptoms have been associated with dyspnea on exertion, orthopnea, wheeze, and palpitations that last minutes.  Her symptoms are worse with exertion, better with rest.  She reports no cough.  She does have 2 L nasal cannula prescribed at home to wear 24/7.  She says she wears it at night and then she wears it when she needs it.  She has not been wearing it today.  Patient denies any fevers.  She has not had any dysuria or hematuria.  Patient is not on dialysis but her nephrologist in [place] has discussed this with her.  Dialysis discussion started in May 2022 and patient has been lost to follow-up since then as she does not have transport to get to her nephrologist in [place].  The discussion seem to be around peritoneal dialysis per patient's description.  Assessment & Plan: 1-Acute on chronic renal failure (ARF) (HCC) -stage 5 at baseline. -patient presented with massive fluid overload, hyperkalemia and  metabolic acidosis. -At this moment Case has been discussed with nephrology service and anticipation is for dialysis initiation eventually. -Maximizing IV Lasix and metolazone we will continue sodium bicarbonate and Lokelma over the weekend. -Follow clinical response.  2-hypertension -In the setting of fluid overload most likely -Continue max dose of IV diuretics -Follow vital signs -If needed will add hydralazine -Norvasc has been discontinued. -Low-sodium/renal diet discussed with patient.  3-type 2 diabetes with nephropathy -Continue sliding scale insulin and follow CBGs fluctuation. -Modify carbohydrate diet discussed with patient.  4-anemia of chronic kidney disease -Follow iron panel -IV iron therapy and Epogen as per nephrology discretion.  5-secondary hyperparathyroidism/hypocalcemia -Calcium gluconate given -PhosLo has been added -Follow electrolytes trend.  6-super morbid obesity -Body mass index is 70 kg/m. -Low calorie diet, portion control and increase physical activity discussed with patient.  DVT prophylaxis: Heparin  Code Status: Full Code. Family Communication: no family at bedside. Disposition:   Status is: Inpatient  Remains inpatient appropriate because:Ongoing diagnostic testing needed not appropriate for outpatient work up  Dispo: The patient is from: Home              Anticipated d/c is to: To be determined              Patient currently is not medically stable to d/c.   Difficult to place patient No       Consultants:  Nephrology service  Procedures:  See below for x-ray  reports.  Antimicrobials:  None   Subjective: Not requiring oxygen supplementation currently; patient denies chest pain.  Still complaining of shortness of breath and orthopnea.  Anasarca appreciated on examination.  Objective: Vitals:   11/26/20 0000 11/26/20 0100 11/26/20 0200 11/26/20 0227  BP: (!) 162/99 (!) 160/90 (!) 157/99 (!) 160/75  Pulse: 80 77 80 80   Resp: '20 19 18 19  '$ Temp:  97.6 F (36.4 C)  97.8 F (36.6 C)  TempSrc:  Oral  Oral  SpO2: 93% 99% 95% 98%  Weight:    (!) 173.6 kg  Height:    '5\' 2"'$  (1.575 m)    Intake/Output Summary (Last 24 hours) at 11/26/2020 0835 Last data filed at 11/26/2020 0124 Gross per 24 hour  Intake 66 ml  Output 1100 ml  Net -1034 ml   Filed Weights   11/25/20 1627 11/26/20 0227  Weight: (!) 149.7 kg (!) 173.6 kg    Examination:  General exam: Alert, awake oriented x3; massively fluid overloaded on examination.  Morbidly obese.  No fever, no chest pain.  No nausea or vomiting. Respiratory system: No using accessory muscles; decreased breath sounds at the bases. Cardiovascular system: S1 & S2 heard, rate controlled, no rubs, no gallops, unable to assess JVD with body habitus. Gastrointestinal system: Abdomen is obese, soft and nontender. No organomegaly or masses felt. Normal bowel sounds heard. Central nervous system: Alert and oriented. No focal neurological deficits. Extremities: Right BKA; 3+ edema appreciated bilaterally. Skin: No petechiae. Psychiatry: Judgement and insight appear normal. Mood & affect appropriate.     Data Reviewed: I have personally reviewed following labs and imaging studies  CBC: Recent Labs  Lab 11/25/20 1801  WBC 9.6  HGB 7.6*  HCT 24.9*  MCV 95.8  PLT 0000000    Basic Metabolic Panel: Recent Labs  Lab 11/25/20 1801  NA 135  K 5.8*  CL 115*  CO2 14*  GLUCOSE 150*  BUN 70*  CREATININE 6.83*  CALCIUM 6.0*  MG 1.6*    GFR: Estimated Creatinine Clearance: 18.6 mL/min (A) (by C-G formula based on SCr of 6.83 mg/dL (H)).  Liver Function Tests: Recent Labs  Lab 11/25/20 1801  AST 15  ALT 9  ALKPHOS 189*  BILITOT 0.6  PROT 6.1*  ALBUMIN 2.3*    CBG: Recent Labs  Lab 11/26/20 0721  GLUCAP 101*     No results found for this or any previous visit (from the past 240 hour(s)).       Radiology Studies: DG Chest Portable 1  View  Result Date: 11/25/2020 CLINICAL DATA:  Edema. The patient states generalized weakness and swelling to lower limbs in the last 4 days. States SOB on exertion/ slight movement. Hx of diabetes, pulmonary edema, CHF, and HTN. Not being tested for covid-19 EXAM: PORTABLE CHEST 1 VIEW.  Patient is rotated. COMPARISON:  CT abdomen pelvis 10/20/2020 FINDINGS: Enlarged cardiac silhouette. The heart size and mediastinal contours are otherwise unchanged. Again noted is slightly more lucent left hemithorax compared to the right. No focal consolidation. Mild pulmonary edema. No pneumothorax. No acute osseous abnormality. IMPRESSION: 1. Bilateral pleural effusions that are difficult to evaluate on this AP semi upright view. Given opacity overlying the right hemithorax, there is a likely larger than trace right pleural effusion. Recommend PA and lateral view of the chest for further evaluation given size of effusions on CT chest 10/20/20. 2. Persistent enlargement of the cardiac silhouette. Underlying pericardial effusion not excluded. 3. Mild pulmonary edema. Electronically Signed  By: Iven Finn M.D.   On: 11/25/2020 19:11        Scheduled Meds:  amLODipine  10 mg Oral Daily   carvedilol  12.5 mg Oral BID   [START ON 11/27/2020] cloNIDine  0.1 mg Transdermal Q Sun   hydrALAZINE  100 mg Oral TID   insulin aspart  0-15 Units Subcutaneous TID WC   insulin aspart  0-5 Units Subcutaneous QHS   insulin glargine  7 Units Subcutaneous QHS   isosorbide mononitrate  30 mg Oral Daily   levETIRAcetam  500 mg Oral BID   sodium bicarbonate  650 mg Oral BID   tamsulosin  0.4 mg Oral QPC supper   Continuous Infusions:  furosemide     magnesium sulfate bolus IVPB       LOS: 1 day    Time spent: 35 minutes.    Barton Dubois, MD [facility]   To contact the attending provider between 7A-7P or the covering provider during after hours 7P-7A, please log into the web site [email or web address] and access  using universal [place] password for that web site. If you do not have the password, please call the hospital operator.  11/26/2020, 8:35 AM

## 2020-11-26 NOTE — Consult Note (Addendum)
[facility] Renal Consultation Note  Requesting MD: Dyann Kief  Indication for Consultation: advanced CKD and volume overload  HPI:  Sheila Austin is a 35 y.o. female with past medical history significant for poorly controlled DM, poorly controlled HTN and progressive CKD-  has seen Marian Sorrow with [facility] nephrology most recently in June (televisit) where dialysis was discussed- has not been able to follow up with her due to transportation issues.   She now presents to the hospital with volume overload so bad that she is not able to bend her knees-   labs showed crt of 6.8-  bicarb of 14 and k of 5.8-  she was admitted and attempted to diurese overnight- put out 1100-  crt 6.4 and bicarb 15, k of 5.7 this AM.  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  Just got approved for disability last week.  Other that the extra fluid and that limiting her mobility she denies any other uremic sxms   Creat  Date/Time Value Ref Range Status  09/26/2015 04:16 PM 0.64 0.50 - 1.10 mg/dL Final  12/07/2013 06:22 PM 0.75 0.50 - 1.10 mg/dL Final  09/26/2011 10:11 AM 0.69 0.50 - 1.10 mg/dL Final   Creatinine, Ser  Date/Time Value Ref Range Status  11/26/2020 08:06 AM 6.54 (H) 0.44 - 1.00 mg/dL Final  11/25/2020 06:01 PM 6.83 (H) 0.44 - 1.00 mg/dL Final  10/24/2020 04:36 AM 5.16 (H) 0.44 - 1.00 mg/dL Final  10/22/2020 04:37 AM 5.02 (H) 0.44 - 1.00 mg/dL Final  10/21/2020 05:21 AM 4.87 (H) 0.44 - 1.00 mg/dL Final  10/20/2020 07:04 AM 4.79 (H) 0.44 - 1.00 mg/dL Final  09/27/2020 09:46 AM 4.76 (H) 0.44 - 1.00 mg/dL Final  09/26/2020 04:54 AM 4.60 (H) 0.44 - 1.00 mg/dL Final  09/25/2020 06:21 AM 4.51 (H) 0.44 - 1.00 mg/dL Final  09/24/2020 08:15 AM 4.42 (H) 0.44 - 1.00 mg/dL Final  09/24/2020 12:44 AM 4.38 (H) 0.44 - 1.00 mg/dL Final  03/05/2020 12:20 PM 2.14 (H) 0.44  - 1.00 mg/dL Final  11/20/2019 07:06 PM 2.24 (H) 0.44 - 1.00 mg/dL Final  10/10/2019 12:30 PM 1.86 (H) 0.44 - 1.00 mg/dL Final  10/10/2019 05:35 AM 1.61 (H) 0.44 - 1.00 mg/dL Final  10/09/2019 08:22 PM 1.71 (H) 0.44 - 1.00 mg/dL Final  08/25/2019 05:38 AM 1.31 (H) 0.44 - 1.00 mg/dL Final  08/24/2019 12:45 AM 1.62 (H) 0.44 - 1.00 mg/dL Final  08/23/2019 03:11 PM 1.54 (H) 0.44 - 1.00 mg/dL Final  05/17/2019 11:59 PM 1.37 (H) 0.44 - 1.00 mg/dL Final  05/16/2019 08:27 PM 1.82 (H) 0.44 - 1.00 mg/dL Final  05/16/2019 01:24 PM 1.45 (H) 0.44 - 1.00 mg/dL Final  04/20/2019 02:25 AM 1.13 (H) 0.44 - 1.00 mg/dL Final  04/19/2019 01:13 PM 1.04 (H) 0.44 - 1.00 mg/dL Final  02/23/2019 04:40 PM 1.14 (H) 0.44 - 1.00 mg/dL Final  02/18/2019 04:59 AM 0.90 0.44 - 1.00 mg/dL Final  02/17/2019 04:17 AM 0.89 0.44 - 1.00 mg/dL Final  02/16/2019 03:50 AM 1.06 (H) 0.44 - 1.00 mg/dL Final  02/13/2019 05:50 AM 0.92 0.44 - 1.00 mg/dL Final  02/11/2019 04:43 AM 0.88 0.44 - 1.00 mg/dL Final  02/09/2019 04:40 PM 1.07 (H) 0.44 - 1.00 mg/dL Final  02/07/2019 06:17 AM 1.00 0.44 - 1.00 mg/dL Final  02/06/2019 04:18 AM 1.05 (H) 0.44 -  1.00 mg/dL Final  02/05/2019 02:12 AM 1.02 (H) 0.44 - 1.00 mg/dL Final  02/04/2019 02:29 AM 1.09 (H) 0.44 - 1.00 mg/dL Final  02/03/2019 05:46 AM 1.13 (H) 0.44 - 1.00 mg/dL Final  02/02/2019 03:01 AM 1.40 (H) 0.44 - 1.00 mg/dL Final  02/01/2019 10:20 AM 1.61 (H) 0.44 - 1.00 mg/dL Final  02/01/2019 06:55 AM 1.88 (H) 0.44 - 1.00 mg/dL Final  02/01/2019 12:33 AM 2.17 (H) 0.44 - 1.00 mg/dL Final  01/03/2019 04:05 AM 1.34 (H) 0.44 - 1.00 mg/dL Final  01/02/2019 05:02 AM 1.95 (H) 0.44 - 1.00 mg/dL Final  01/01/2019 03:59 AM 1.92 (H) 0.44 - 1.00 mg/dL Final  12/31/2018 07:20 PM 1.50 (H) 0.44 - 1.00 mg/dL Final  07/07/2018 06:20 AM 0.74 0.44 - 1.00 mg/dL Final  07/06/2018 10:58 AM 0.62 0.44 - 1.00 mg/dL Final  07/05/2018 07:19 AM 0.83 0.44 - 1.00 mg/dL Final  07/03/2018 06:17 AM 0.71 0.44 -  1.00 mg/dL Final  07/02/2018 01:36 PM 0.74 0.44 - 1.00 mg/dL Final  06/16/2017 01:46 PM 0.81 0.44 - 1.00 mg/dL Final  04/24/2017 12:06 PM 0.72 0.44 - 1.00 mg/dL Final  08/20/2016 04:40 PM 0.86 0.44 - 1.00 mg/dL Final     PMHx:   Past Medical History:  Diagnosis Date   Acute exacerbation of CHF (congestive heart failure) ([place]) 10/20/2020   Diabetes mellitus    uncontrolled, last A1c was 14   E. coli sepsis ([place])    Ectopic pregnancy    Essential hypertension, benign 11/01/2008   Fatty liver    Gastroparesis    GERD (gastroesophageal reflux disease)    Helicobacter pylori gastritis 09/07/2011   Kidney disease    stage 5-followed by [facility]   Migraine    Morbid obesity with body mass index (BMI) of 40.0 to 49.9 ([place])    Pulmonary edema    Pyelonephritis    Ureteral obstruction     Past Surgical History:  Procedure Laterality Date   AMPUTATION Right 02/04/2019   Procedure: RIGHT BELOW KNEE AMPUTATION;  Surgeon: Newt Minion, MD;  Location: [place];  Service: Orthopedics;  Laterality: Right;   COLONOSCOPY  2015   TI normal, colon appeared normal. Multiple biopsies benign.    ESOPHAGOGASTRODUODENOSCOPY  2010   Normal   WOUND DEBRIDEMENT Right 01/03/2019   Procedure: DEBRIDEMENT WOUND RIGHT LATERAL FOOT;  Surgeon: Angelia Mould, MD;  Location: [facility] OR;  Service: Vascular;  Laterality: Right;    Family Hx:  Family History  Problem Relation Age of Onset   Arthritis Mother    Asthma Mother    COPD Mother    Depression Mother    Diabetes Mother    Hypertension Mother    Mental illness Mother    Kidney disease Mother    Crohn's disease Mother    Colon polyps Mother        in her 72s. Unknown if adenomas     Aneurysm Father 47       brain   Mental illness Sister    Crohn's disease Sister    Arthritis Maternal Aunt    Asthma Maternal Aunt    COPD Maternal Aunt    Depression Maternal Aunt    Diabetes Maternal Aunt    Hypertension Maternal Aunt    Mental  illness Maternal Aunt    Stroke Maternal Aunt    Heart failure Maternal Aunt    Heart failure Maternal Grandfather    Cancer Maternal Grandfather    Diabetes Maternal Grandfather  Heart disease Maternal Grandfather    Hypertension Maternal Grandfather    Hyperlipidemia Maternal Grandfather    Mental illness Brother    Diabetes Maternal Grandmother    Heart disease Maternal Grandmother    Hypertension Maternal Grandmother    Hyperlipidemia Maternal Grandmother    Diabetes Paternal Grandmother    Heart disease Paternal Grandmother    Diabetes Paternal Grandfather    Cancer Paternal Grandfather    Colon cancer Neg Hx     Social History:  reports that she has never smoked. She has never used smokeless tobacco. She reports previous alcohol use. She reports that she does not use drugs.  Allergies:  Allergies  Allergen Reactions   Bee Venom Anaphylaxis, Hives, Shortness Of Breath and Swelling   Penicillins Anaphylaxis and Itching    Has patient had a PCN reaction causing immediate rash, facial/tongue/throat swelling, SOB or lightheadedness with hypotension: yes Has patient had a PCN reaction causing severe rash involving mucus membranes or skin necrosis: unknown Has patient had a PCN reaction that required hospitalization : yes Has patient had a PCN reaction occurring within the last 10 years: no If all of the above answers are "NO", then may proceed with Cephalosporin use.    Lorazepam     Other reaction(s): Hallucinations   Lovenox [Enoxaparin Sodium] Other (See Comments)    Hyperkalemia? GI bleed   Metoclopramide Other (See Comments)    Dystonia following metoclopramide injection    Medications: Prior to Admission medications   Medication Sig Start Date End Date Taking? Authorizing Provider  amLODipine (NORVASC) 10 MG tablet Take 1 tablet (10 mg total) by mouth daily. 09/27/20 09/27/21 Yes Emokpae, Courage, MD  bumetanide (BUMEX) 1 MG tablet Take 1 tablet (1 mg total) by  mouth 2 (two) times daily. 10/24/20  Yes Emokpae, Courage, MD  carvedilol (COREG) 12.5 MG tablet Take 1 tablet (12.5 mg total) by mouth 2 (two) times daily. 09/27/20  Yes Roxan Hockey, MD  cloNIDine (CATAPRES - DOSED IN MG/24 HR) 0.1 mg/24hr patch Place 1 patch (0.1 mg total) onto the skin every Sunday. Once weekly 10/02/20  Yes Emokpae, Courage, MD  CVS VITAMIN C 1000 MG tablet Take 1,000 mg by mouth 2 (two) times daily. 10/29/19  Yes [provider]  hydrALAZINE (APRESOLINE) 100 MG tablet Take 1 tablet (100 mg total) by mouth 3 (three) times daily. 09/27/20  Yes Emokpae, Courage, MD  insulin aspart (NOVOLOG) 100 UNIT/ML FlexPen Inject 3 Units into the skin 3 (three) times daily with meals. 09/27/20  Yes Emokpae, Courage, MD  LANTUS 100 UNIT/ML injection Inject 0.1 mLs (10 Units total) into the skin at bedtime. 10/24/20  Yes Emokpae, Courage, MD  metolazone (ZAROXOLYN) 2.5 MG tablet Take 2.5 mg by mouth daily. 07/11/20  Yes [provider]  PROAIR HFA 108 (90 Base) MCG/ACT inhaler Inhale 2 puffs into the lungs every 6 (six) hours as needed for wheezing or shortness of breath. 05/20/20  Yes [provider]  promethazine (PHENERGAN) 25 MG tablet Take 1 tablet (25 mg total) by mouth every 6 (six) hours as needed for nausea. 10/24/20  Yes Emokpae, Courage, MD  sodium bicarbonate 650 MG tablet Take 1 tablet (650 mg total) by mouth 2 (two) times daily. 10/24/20 10/24/21 Yes Emokpae, Courage, MD  tamsulosin (FLOMAX) 0.4 MG CAPS capsule Take 1 capsule (0.4 mg total) by mouth daily after supper. For Bladder 10/24/20  Yes Roxan Hockey, MD  Vitamin D, Ergocalciferol, (DRISDOL) 1.25 MG (50000 UNIT) CAPS capsule Take 1  capsule by mouth once a week. 07/11/20  Yes [provider]  CVS ZINC GLUCONATE 50 MG tablet Take 50 mg by mouth daily. Patient not taking: No sig reported 10/29/19   [provider]  ferrous sulfate 325 (65 FE) MG tablet Take 1 tablet (325 mg total) by mouth  daily with breakfast. Patient not taking: No sig reported 10/24/20   Roxan Hockey, MD  isosorbide mononitrate (IMDUR) 30 MG 24 hr tablet Take 1 tablet (30 mg total) by mouth daily. Patient not taking: No sig reported 09/28/20   Roxan Hockey, MD  levETIRAcetam (KEPPRA) 500 MG tablet Take 1 tablet (500 mg total) by mouth 2 (two) times daily. Patient not taking: No sig reported 10/24/20   Roxan Hockey, MD    I have reviewed the patient's current medications.  Labs:  Results for orders placed or performed during the hospital encounter of 11/25/20 (from the past 48 hour(s))  Basic metabolic panel     Status: Abnormal   Collection Time: 11/25/20  6:01 PM  Result Value Ref Range   Sodium 135 135 - 145 mmol/L   Potassium 5.8 (H) 3.5 - 5.1 mmol/L   Chloride 115 (H) 98 - 111 mmol/L   CO2 14 (L) 22 - 32 mmol/L   Glucose, Bld 150 (H) 70 - 99 mg/dL    Comment: Glucose reference range applies only to samples taken after fasting for at least 8 hours.   BUN 70 (H) 6 - 20 mg/dL   Creatinine, Ser 6.83 (H) 0.44 - 1.00 mg/dL   Calcium 6.0 (LL) 8.9 - 10.3 mg/dL    Comment: CRITICAL RESULT CALLED TO, READ BACK BY AND VERIFIED WITH: DEVOLT,T ON 11/25/20 AT 1835 BY LOY,C    GFR, Estimated 8 (L) >60 mL/min    Comment: (NOTE) Calculated using the CKD-EPI Creatinine Equation (2021)    Anion gap 6 5 - 15    Comment: Performed at [facility], [address] [place], [place] [address]  Magnesium     Status: Abnormal   Collection Time: 11/25/20  6:01 PM  Result Value Ref Range   Magnesium 1.6 (L) 1.7 - 2.4 mg/dL    Comment: Performed at [facility], [address], [place], [place] [address]  Brain natriuretic peptide (order ONLY if patient c/o SOB)     Status: Abnormal   Collection Time: 11/25/20  6:01 PM  Result Value Ref Range   B Natriuretic Peptide 428.0 (H) 0.0 - 100.0 pg/mL    Comment: Performed at [facility], [address], [place], [place] [address]  CBC     Status: Abnormal    Collection Time: 11/25/20  6:01 PM  Result Value Ref Range   WBC 9.6 4.0 - 10.5 K/uL   RBC 2.60 (L) 3.87 - 5.11 MIL/uL   Hemoglobin 7.6 (L) 12.0 - 15.0 g/dL   HCT 24.9 (L) 36.0 - 46.0 %   MCV 95.8 80.0 - 100.0 fL   MCH 29.2 26.0 - 34.0 pg   MCHC 30.5 30.0 - 36.0 g/dL   RDW 15.0 11.5 - 15.5 %   Platelets 204 150 - 400 K/uL   nRBC 0.0 0.0 - 0.2 %    Comment: Performed at [facility], [address], [place], [place] [address]  Hepatic function panel     Status: Abnormal   Collection Time: 11/25/20  6:01 PM  Result Value Ref Range   Total Protein 6.1 (L) 6.5 - 8.1 g/dL   Albumin 2.3 (L) 3.5 -  5.0 g/dL   AST 15 15 - 41 U/L   ALT 9 0 - 44 U/L   Alkaline Phosphatase 189 (H) 38 - 126 U/L   Total Bilirubin 0.6 0.3 - 1.2 mg/dL   Bilirubin, Direct 0.1 0.0 - 0.2 mg/dL   Indirect Bilirubin 0.5 0.3 - 0.9 mg/dL    Comment: Performed at [facility], [address] [place], [place] [address]  POC urine preg, ED     Status: None   Collection Time: 11/25/20  8:35 PM  Result Value Ref Range   Preg Test, Ur NEGATIVE NEGATIVE    Comment:        THE SENSITIVITY OF THIS METHODOLOGY IS >24 mIU/mL   Urinalysis, Routine w reflex microscopic Urine, Catheterized     Status: Abnormal   Collection Time: 11/25/20  8:37 PM  Result Value Ref Range   Color, Urine YELLOW YELLOW   APPearance CLEAR CLEAR   Specific Gravity, Urine 1.011 1.005 - 1.030   pH 6.0 5.0 - 8.0   Glucose, UA 150 (A) NEGATIVE mg/dL   Hgb urine dipstick SMALL (A) NEGATIVE   Bilirubin Urine NEGATIVE NEGATIVE   Ketones, ur NEGATIVE NEGATIVE mg/dL   Protein, ur >=300 (A) NEGATIVE mg/dL   Nitrite NEGATIVE NEGATIVE   Leukocytes,Ua NEGATIVE NEGATIVE   RBC / HPF 6-10 0 - 5 RBC/hpf   WBC, UA 11-20 0 - 5 WBC/hpf   Bacteria, UA RARE (A) NONE SEEN   Squamous Epithelial / LPF 0-5 0 - 5   Mucus PRESENT     Comment: Performed at [facility], [address], [place], [place] [address]  Glucose, capillary     Status: Abnormal   Collection  Time: 11/26/20  7:21 AM  Result Value Ref Range   Glucose-Capillary 101 (H) 70 - 99 mg/dL    Comment: Glucose reference range applies only to samples taken after fasting for at least 8 hours.  Comprehensive metabolic panel     Status: Abnormal   Collection Time: 11/26/20  8:06 AM  Result Value Ref Range   Sodium 137 135 - 145 mmol/L   Potassium 5.7 (H) 3.5 - 5.1 mmol/L   Chloride 114 (H) 98 - 111 mmol/L   CO2 15 (L) 22 - 32 mmol/L   Glucose, Bld 104 (H) 70 - 99 mg/dL    Comment: Glucose reference range applies only to samples taken after fasting for at least 8 hours.   BUN 68 (H) 6 - 20 mg/dL   Creatinine, Ser 6.54 (H) 0.44 - 1.00 mg/dL   Calcium 6.2 (LL) 8.9 - 10.3 mg/dL    Comment: CRITICAL RESULT CALLED TO, READ BACK BY AND VERIFIED WITH: WHITTNEY EARLY,RN '@[id number]'$  11/26/2020 KAY    Total Protein 5.7 (L) 6.5 - 8.1 g/dL   Albumin 2.1 (L) 3.5 - 5.0 g/dL   AST 10 (L) 15 - 41 U/L   ALT 10 0 - 44 U/L   Alkaline Phosphatase 171 (H) 38 - 126 U/L   Total Bilirubin 0.7 0.3 - 1.2 mg/dL   GFR, Estimated 8 (L) >60 mL/min    Comment: (NOTE) Calculated using the CKD-EPI Creatinine Equation (2021)    Anion gap 8 5 - 15    Comment: Performed at [facility], [address] [place], [place] [address]  Magnesium     Status: Abnormal   Collection Time: 11/26/20  8:06 AM  Result Value Ref Range   Magnesium 1.5 (L) 1.7 - 2.4 mg/dL    Comment: Performed at  [address] [place], [place] [address]  CBC     Status: Abnormal   Collection Time: 11/26/20  8:06 AM  Result Value Ref Range   WBC 7.6 4.0 - 10.5 K/uL   RBC 2.52 (L) 3.87 - 5.11 MIL/uL   Hemoglobin 7.3 (L) 12.0 - 15.0 g/dL   HCT 24.3 (L) 36.0 - 46.0 %   MCV 96.4 80.0 - 100.0 fL   MCH 29.0 26.0 - 34.0 pg   MCHC 30.0 30.0 - 36.0 g/dL   RDW 14.8 11.5 - 15.5 %   Platelets 179 150 - 400 K/uL   nRBC 0.0 0.0 - 0.2 %    Comment: Performed at [facility], [address], [place], [place] [address]  Phosphorus     Status:  Abnormal   Collection Time: 11/26/20  8:06 AM  Result Value Ref Range   Phosphorus 7.6 (H) 2.5 - 4.6 mg/dL    Comment: Performed at [facility], [address], [place], [place] [address]     ROS:  A comprehensive review of systems was negative except for: Constitutional: positive for fatigue Cardiovascular: positive for lower extremity edema  Physical Exam: Vitals:   11/26/20 0227 11/26/20 0903  BP: (!) 160/75 (!) 181/96  Pulse: 80 80  Resp: 19   Temp: 97.8 F (36.6 C)   SpO2: 98% 98%     General:  pleasant WF, morbidly obese-  NAD-  says not being able to be mobile is driving her nuts HEENT: PERRLA, EOMI Neck: difficult to tell JVD Heart: RRR Lungs: dec BS at bases  Abdomen: obese, soft non tender Extremities: pitting edema bilat-  woody appearance-  right BKA well healed Skin: acne on face-  peau d'orange appearing  Neuro: alert, non focal   Assessment/Plan: 35 year old WF with advanced CKD in the setting of poorlly controlled DM and HTN, poor social situation not allowing appropriate follow up-  now with massive volume overload-  anemia , metabolic acidosis and hyperkalemia 1.Renal- advanced CKD-  progressive for a while.  I suspect we are at the place where dialysis is needed.  Pt is accepting of this fact.  Being that it is the weekend at [facility]-  we will likely not be able to achieve access until Monday-  will contact gen surgery and vascular surgery for plan on Monday.  In the meantime there is no urgent urgent need for HD-  will cont with max medical therapy of IV lasix max dose-  add metolazone-  is making some urine-  also cont bicarb and lokelma and follow labs 2. Hypertension/volume  - hypertensive mostly due to volume-  will stop norvasc-  max medical diuresis 3. Anemia  - likely due to CKD-  check iron stores and treat with iron/esa as needed  4. Bones-  check PTH and phos and act as needed.  Her corrected calcium is on the 8's-  there is an elvated phos-  will  add phoslo  5. Dispo-  I cant help but wonder if she might benefit from a SNF placement for rehab or some OP rehab so that she can be more mobile if that is even possible  Renal will not physically see on Sunday but will revisit on Monday-  call with questions    Louis Meckel 11/26/2020, 10:52 AM

## 2020-11-26 NOTE — Progress Notes (Signed)
CRITICAL VALUE STICKER  CRITICAL VALUE: Calcium 6.2  RECEIVER (on-site recipient of call): W. Kilan Banfill  DATE & TIME NOTIFIED: 7/23 @ 9:40  MESSENGER (representative from lab): Katie  MD NOTIFIED: Dyann Kief  TIME OF NOTIFICATION: [id number]  RESPONSE:  orders to come.

## 2020-11-26 NOTE — H&P (Signed)
[facility] H&P    Patient Demographics:    Sheila Austin, is a 35 y.o. female  MRN: [id number]  DOB - 1985-06-26  Admit Date - 11/25/2020  Referring MD/NP/PA: Alfonzo Feller  Outpatient Primary MD for the patient is Pcp, No  Patient coming from: Home  Chief complaint- Swelling   HPI:    Sheila Austin  is a 35 y.o. female,with hx of CHF, DMII, HTN, GERD, morbid obesity, and chronic kidney disease presents to the ED with a chief complaint of peripheral edema.  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  She reports that she has a bariatric wheelchair at home, but she and her fianc live in a mobile home.  There are parts of the home, including the bathroom, where the bariatric wheelchair cannot access.  Patient usually crawls to the bathroom.  She reports that she has had increased swelling and peripheral edema, but she really started to notice it today, because she could not bend her knee to crawl on the floor.  She reports she can even get off the couch.  She also notes that she has had a decrease in urine output.  The symptoms have been associated with dyspnea on exertion, orthopnea, wheeze, and palpitations that last minutes.  Her symptoms are worse with exertion, better with rest.  She reports no cough.  She does have 2 L nasal cannula prescribed at home to wear 24/7.  She says she wears it at night and then she wears it when she needs it.  She has not been wearing it today.  Patient denies any fevers.  She has not had any dysuria or hematuria.  Patient is not on dialysis but her nephrologist in [place] has discussed this with her.  Dialysis discussion started in May 2022 and patient has been lost to follow-up since then as she does not have transport to get to her nephrologist in [place].  The discussion seem to be around peritoneal dialysis per patient's description.  Patient  reports her last menstrual period was at the end of June.  She knows she is not pregnant as she has been abstinent for  more than 9 months, and also believes that she is not able to get pregnant, as she has tried in the past and been unsuccessful.  Patient does not smoke, does not drink alcohol, does not use illicit drugs.  She is not vaccinated for COVID.  She is full code.  In the ED Temp 97.4, heart rate 82, respiratory rate 17-23, blood pressure 157/97, satting 99% No leukocytosis, chemistry panel reveals a hyperkalemia at 5.8, decreased bicarb at 14, elevated BUN at 70 and creatinine 6.3, hyperglycemia at 150, mag 1.6, BNP elevated at 428, calcium 6.0, albumin 2.3 Nephrology was consulted and recommended aggressive diuresis with 160 mg IV Lasix every 8 hours Lokelma was started, but nephrology also reports that the Lasix should washout the potassium as well Plan for nephrology to see patient in the a.m. Chest x-ray showed bilateral pleural effusions persistent enlargement of cardiac silhouette  and mild pulmonary edema Admission was requested for further management of AKI      Review of systems:    In addition to the HPI above,  No Fever-chills, No Headache, No changes with Vision or hearing, No problems swallowing food or Liquids, No Chest pain, Cough admits to dyspnea  no Abdominal pain, No Nausea or Vomiting, bowel movements are regular, No Blood in stool or Urine, No dysuria, admits to decreased urine output Admits to bruises and ulcers on BKA stump from crawling on the floor No new joints pains-aches,  No new weakness, tingling, numbness in any extremity, No recent weight gain or loss, No polyuria, polydypsia or polyphagia, No significant Mental Stressors.  All other systems reviewed and are negative.    Past History of the following :    Past Medical History:  Diagnosis Date   Acute exacerbation of CHF (congestive heart failure) ([place]) 10/20/2020   Diabetes mellitus     uncontrolled, last A1c was 14   E. coli sepsis ([place])    Ectopic pregnancy    Essential hypertension, benign 11/01/2008   Fatty liver    Gastroparesis    GERD (gastroesophageal reflux disease)    Helicobacter pylori gastritis 09/07/2011   Kidney disease    stage 5-followed by [facility]   Migraine    Morbid obesity with body mass index (BMI) of 40.0 to 49.9 [facility])    Pulmonary edema    Pyelonephritis    Ureteral obstruction       Past Surgical History:  Procedure Laterality Date   AMPUTATION Right 02/04/2019   Procedure: RIGHT BELOW KNEE AMPUTATION;  Surgeon: Newt Minion, MD;  Location: [place];  Service: Orthopedics;  Laterality: Right;   COLONOSCOPY  2015   TI normal, colon appeared normal. Multiple biopsies benign.    ESOPHAGOGASTRODUODENOSCOPY  2010   Normal   WOUND DEBRIDEMENT Right 01/03/2019   Procedure: DEBRIDEMENT WOUND RIGHT LATERAL FOOT;  Surgeon: Angelia Mould, MD;  Location: [facility] OR;  Service: Vascular;  Laterality: Right;      Social History:      Social History   Tobacco Use   Smoking status: Never   Smokeless tobacco: Never  Substance Use Topics   Alcohol use: Not Currently       Family History :     Family History  Problem Relation Age of Onset   Arthritis Mother    Asthma Mother    COPD Mother    Depression Mother    Diabetes Mother    Hypertension Mother    Mental illness Mother    Kidney disease Mother    Crohn's disease Mother    Colon polyps Mother        in her 20s. Unknown if adenomas     Aneurysm Father 39       brain   Mental illness Sister    Crohn's disease Sister    Arthritis Maternal Aunt    Asthma Maternal Aunt    COPD Maternal Aunt    Depression Maternal Aunt    Diabetes Maternal Aunt    Hypertension Maternal Aunt    Mental illness Maternal Aunt    Stroke Maternal Aunt    Heart failure Maternal Aunt    Heart failure Maternal Grandfather    Cancer Maternal Grandfather    Diabetes Maternal Grandfather     Heart disease Maternal Grandfather    Hypertension Maternal Grandfather    Hyperlipidemia Maternal Grandfather    Mental illness Brother  Diabetes Maternal Grandmother    Heart disease Maternal Grandmother    Hypertension Maternal Grandmother    Hyperlipidemia Maternal Grandmother    Diabetes Paternal Grandmother    Heart disease Paternal Grandmother    Diabetes Paternal Grandfather    Cancer Paternal Grandfather    Colon cancer Neg Hx       Home Medications:   Prior to Admission medications   Medication Sig Start Date End Date Taking? Authorizing Provider  amLODipine (NORVASC) 10 MG tablet Take 1 tablet (10 mg total) by mouth daily. 09/27/20 09/27/21 Yes Emokpae, Courage, MD  bumetanide (BUMEX) 1 MG tablet Take 1 tablet (1 mg total) by mouth 2 (two) times daily. 10/24/20  Yes Emokpae, Courage, MD  carvedilol (COREG) 12.5 MG tablet Take 1 tablet (12.5 mg total) by mouth 2 (two) times daily. 09/27/20  Yes Roxan Hockey, MD  cloNIDine (CATAPRES - DOSED IN MG/24 HR) 0.1 mg/24hr patch Place 1 patch (0.1 mg total) onto the skin every Sunday. Once weekly 10/02/20  Yes Emokpae, Courage, MD  [employer] VITAMIN C 1000 MG tablet Take 1,000 mg by mouth 2 (two) times daily. 10/29/19  Yes [provider]  hydrALAZINE (APRESOLINE) 100 MG tablet Take 1 tablet (100 mg total) by mouth 3 (three) times daily. 09/27/20  Yes Emokpae, Courage, MD  insulin aspart (NOVOLOG) 100 UNIT/ML FlexPen Inject 3 Units into the skin 3 (three) times daily with meals. 09/27/20  Yes Emokpae, Courage, MD  LANTUS 100 UNIT/ML injection Inject 0.1 mLs (10 Units total) into the skin at bedtime. 10/24/20  Yes Emokpae, Courage, MD  metolazone (ZAROXOLYN) 2.5 MG tablet Take 2.5 mg by mouth daily. 07/11/20  Yes [provider]  PROAIR HFA 108 (90 Base) MCG/ACT inhaler Inhale 2 puffs into the lungs every 6 (six) hours as needed for wheezing or shortness of breath. 05/20/20  Yes [provider]  promethazine  (PHENERGAN) 25 MG tablet Take 1 tablet (25 mg total) by mouth every 6 (six) hours as needed for nausea. 10/24/20  Yes Emokpae, Courage, MD  sodium bicarbonate 650 MG tablet Take 1 tablet (650 mg total) by mouth 2 (two) times daily. 10/24/20 10/24/21 Yes Emokpae, Courage, MD  tamsulosin (FLOMAX) 0.4 MG CAPS capsule Take 1 capsule (0.4 mg total) by mouth daily after supper. For Bladder 10/24/20  Yes Emokpae, Courage, MD  Vitamin D, Ergocalciferol, (DRISDOL) 1.25 MG (50000 UNIT) CAPS capsule Take 1 capsule by mouth once a week. 07/11/20  Yes [provider]  CVS ZINC GLUCONATE 50 MG tablet Take 50 mg by mouth daily. Patient not taking: No sig reported 10/29/19   [provider]  ferrous sulfate 325 (65 FE) MG tablet Take 1 tablet (325 mg total) by mouth daily with breakfast. Patient not taking: No sig reported 10/24/20   Roxan Hockey, MD  isosorbide mononitrate (IMDUR) 30 MG 24 hr tablet Take 1 tablet (30 mg total) by mouth daily. Patient not taking: No sig reported 09/28/20   Roxan Hockey, MD  levETIRAcetam (KEPPRA) 500 MG tablet Take 1 tablet (500 mg total) by mouth 2 (two) times daily. Patient not taking: No sig reported 10/24/20   Roxan Hockey, MD     Allergies:     Allergies  Allergen Reactions   Bee Venom Anaphylaxis, Hives, Shortness Of Breath and Swelling   Penicillins Anaphylaxis and Itching    Has patient had a PCN reaction causing immediate rash, facial/tongue/throat swelling, SOB or lightheadedness with hypotension: yes Has patient had a PCN reaction causing severe rash  involving mucus membranes or skin necrosis: unknown Has patient had a PCN reaction that required hospitalization : yes Has patient had a PCN reaction occurring within the last 10 years: no If all of the above answers are "NO", then may proceed with Cephalosporin use.    Lorazepam     Other reaction(s): Hallucinations   Lovenox [Enoxaparin Sodium] Other (See Comments)    Hyperkalemia? GI  bleed   Metoclopramide Other (See Comments)    Dystonia following metoclopramide injection     Physical Exam:   Vitals  Blood pressure (!) 157/99, pulse 80, temperature 97.6 F (36.4 C), temperature source Oral, resp. rate 18, height '5\' 2"'$  (1.575 m), weight (!) 149.7 kg, SpO2 95 %.  1.  General: Patient lying supine in bed,  no acute distress   2. Psychiatric: Alert and oriented x 3, mood and behavior normal for situation, pleasant and cooperative with exam   3. Neurologic: Speech and language are normal, face is symmetric, moves all 4 extremities voluntarily, at baseline without acute deficits on limited exam   4. HEENMT:  Head is atraumatic, normocephalic, pupils reactive to light, neck is supple, trachea is midline, mucous membranes are moist   5. Respiratory : Lungs are  without wheezing, rhonchi, mild crackles present, diminished breath sounds in the lowest lung fields, no cyanosis, no increase in work of breathing or accessory muscle use   6. Cardiovascular : Heart rate normal, rhythm is regular, no murmurs, rubs or gallops, anasarca present with pitting edema up past the navel   7. Gastrointestinal:  Abdomen is soft, nondistended, nontender to palpation bowel sounds active, no masses or organomegaly palpated   8. Skin:  Skin is warm, dry and intact, venous stasis changes in left lower extremity and right BKA stump, without rashes, acute lesions, or ulcers on limited exam   9.Musculoskeletal:  No acute deformities or trauma, no asymmetry in tone, peripheral pulses palpated, no tenderness to palpation in the extremities     Data Review:    CBC Recent Labs  Lab 11/25/20 1801  WBC 9.6  HGB 7.6*  HCT 24.9*  PLT 204  MCV 95.8  MCH 29.2  MCHC 30.5  RDW 15.0   ------------------------------------------------------------------------------------------------------------------  Results for orders placed or performed during the hospital encounter of 11/25/20 (from  the past 48 hour(s))  Basic metabolic panel     Status: Abnormal   Collection Time: 11/25/20  6:01 PM  Result Value Ref Range   Sodium 135 135 - 145 mmol/L   Potassium 5.8 (H) 3.5 - 5.1 mmol/L   Chloride 115 (H) 98 - 111 mmol/L   CO2 14 (L) 22 - 32 mmol/L   Glucose, Bld 150 (H) 70 - 99 mg/dL    Comment: Glucose reference range applies only to samples taken after fasting for at least 8 hours.   BUN 70 (H) 6 - 20 mg/dL   Creatinine, Ser 6.83 (H) 0.44 - 1.00 mg/dL   Calcium 6.0 (LL) 8.9 - 10.3 mg/dL    Comment: CRITICAL RESULT CALLED TO, READ BACK BY AND VERIFIED WITH: DEVOLT,T ON 11/25/20 AT 1835 BY LOY,C    GFR, Estimated 8 (L) >60 mL/min    Comment: (NOTE) Calculated using the CKD-EPI Creatinine Equation (2021)    Anion gap 6 5 - 15    Comment: Performed at [facility], [address], [place], [place] [address]  Magnesium     Status: Abnormal   Collection Time: 11/25/20  6:01 PM  Result Value Ref  Range   Magnesium 1.6 (L) 1.7 - 2.4 mg/dL    Comment: Performed at [facility], [address] [place], [place] [address]  Brain natriuretic peptide (order ONLY if patient c/o SOB)     Status: Abnormal   Collection Time: 11/25/20  6:01 PM  Result Value Ref Range   B Natriuretic Peptide 428.0 (H) 0.0 - 100.0 pg/mL    Comment: Performed at [facility], [address] [place], [place] [address]  CBC     Status: Abnormal   Collection Time: 11/25/20  6:01 PM  Result Value Ref Range   WBC 9.6 4.0 - 10.5 K/uL   RBC 2.60 (L) 3.87 - 5.11 MIL/uL   Hemoglobin 7.6 (L) 12.0 - 15.0 g/dL   HCT 24.9 (L) 36.0 - 46.0 %   MCV 95.8 80.0 - 100.0 fL   MCH 29.2 26.0 - 34.0 pg   MCHC 30.5 30.0 - 36.0 g/dL   RDW 15.0 11.5 - 15.5 %   Platelets 204 150 - 400 K/uL   nRBC 0.0 0.0 - 0.2 %    Comment: Performed at [facility], [address], [place], [place] [address]  Hepatic function panel     Status: Abnormal   Collection Time: 11/25/20  6:01 PM  Result Value Ref Range   Total Protein 6.1 (L) 6.5 -  8.1 g/dL   Albumin 2.3 (L) 3.5 - 5.0 g/dL   AST 15 15 - 41 U/L   ALT 9 0 - 44 U/L   Alkaline Phosphatase 189 (H) 38 - 126 U/L   Total Bilirubin 0.6 0.3 - 1.2 mg/dL   Bilirubin, Direct 0.1 0.0 - 0.2 mg/dL   Indirect Bilirubin 0.5 0.3 - 0.9 mg/dL    Comment: Performed at [facility], [address], [place], [place] [address]  POC urine preg, ED     Status: None   Collection Time: 11/25/20  8:35 PM  Result Value Ref Range   Preg Test, Ur NEGATIVE NEGATIVE    Comment:        THE SENSITIVITY OF THIS METHODOLOGY IS >24 mIU/mL   Urinalysis, Routine w reflex microscopic Urine, Catheterized     Status: Abnormal   Collection Time: 11/25/20  8:37 PM  Result Value Ref Range   Color, Urine YELLOW YELLOW   APPearance CLEAR CLEAR   Specific Gravity, Urine 1.011 1.005 - 1.030   pH 6.0 5.0 - 8.0   Glucose, UA 150 (A) NEGATIVE mg/dL   Hgb urine dipstick SMALL (A) NEGATIVE   Bilirubin Urine NEGATIVE NEGATIVE   Ketones, ur NEGATIVE NEGATIVE mg/dL   Protein, ur >=300 (A) NEGATIVE mg/dL   Nitrite NEGATIVE NEGATIVE   Leukocytes,Ua NEGATIVE NEGATIVE   RBC / HPF 6-10 0 - 5 RBC/hpf   WBC, UA 11-20 0 - 5 WBC/hpf   Bacteria, UA RARE (A) NONE SEEN   Squamous Epithelial / LPF 0-5 0 - 5   Mucus PRESENT     Comment: Performed at [facility], [address] [place], [place] [address]    Chemistries  Recent Labs  Lab 11/25/20 1801  NA 135  K 5.8*  CL 115*  CO2 14*  GLUCOSE 150*  BUN 70*  CREATININE 6.83*  CALCIUM 6.0*  MG 1.6*  AST 15  ALT 9  ALKPHOS 189*  BILITOT 0.6   ------------------------------------------------------------------------------------------------------------------  ------------------------------------------------------------------------------------------------------------------ GFR: Estimated Creatinine Clearance: 16.8 mL/min (A) (by C-G formula based on SCr of 6.83 mg/dL (H)). Liver Function Tests: Recent Labs  Lab 11/25/20 1801  AST 15  ALT 9  ALKPHOS 189*   BILITOT 0.6  PROT 6.1*  ALBUMIN 2.3*   No results for input(s): LIPASE, AMYLASE in the last 168 hours. No results for input(s): AMMONIA in the last 168 hours. Coagulation Profile: No results for input(s): INR, PROTIME in the last 168 hours. Cardiac Enzymes: No results for input(s): CKTOTAL, CKMB, CKMBINDEX, TROPONINI in the last 168 hours. BNP (last 3 results) No results for input(s): PROBNP in the last 8760 hours. HbA1C: No results for input(s): HGBA1C in the last 72 hours. CBG: No results for input(s): GLUCAP in the last 168 hours. Lipid Profile: No results for input(s): CHOL, HDL, LDLCALC, TRIG, CHOLHDL, LDLDIRECT in the last 72 hours. Thyroid Function Tests: No results for input(s): TSH, T4TOTAL, FREET4, T3FREE, THYROIDAB in the last 72 hours. Anemia Panel: No results for input(s): VITAMINB12, FOLATE, FERRITIN, TIBC, IRON, RETICCTPCT in the last 72 hours.  --------------------------------------------------------------------------------------------------------------- Urine analysis:    Component Value Date/Time   COLORURINE YELLOW 11/25/2020 2037   APPEARANCEUR CLEAR 11/25/2020 2037   LABSPEC 1.011 11/25/2020 2037   PHURINE 6.0 11/25/2020 2037   GLUCOSEU 150 (A) 11/25/2020 2037   HGBUR SMALL (A) 11/25/2020 2037   BILIRUBINUR NEGATIVE 11/25/2020 2037   BILIRUBINUR negative 08/13/2017 1622   KETONESUR NEGATIVE 11/25/2020 2037   PROTEINUR >=300 (A) 11/25/2020 2037   UROBILINOGEN 0.2 08/13/2017 1622   UROBILINOGEN 0.2 11/25/2014 0553   NITRITE NEGATIVE 11/25/2020 2037   LEUKOCYTESUR NEGATIVE 11/25/2020 2037      Imaging Results:    DG Chest Portable 1 View  Result Date: 11/25/2020 CLINICAL DATA:  Edema. The patient states generalized weakness and swelling to lower limbs in the last 4 days. States SOB on exertion/ slight movement. Hx of diabetes, pulmonary edema, CHF, and HTN. Not being tested for covid-19 EXAM: PORTABLE CHEST 1 VIEW.  Patient is rotated. COMPARISON:   CT abdomen pelvis 10/20/2020 FINDINGS: Enlarged cardiac silhouette. The heart size and mediastinal contours are otherwise unchanged. Again noted is slightly more lucent left hemithorax compared to the right. No focal consolidation. Mild pulmonary edema. No pneumothorax. No acute osseous abnormality. IMPRESSION: 1. Bilateral pleural effusions that are difficult to evaluate on this AP semi upright view. Given opacity overlying the right hemithorax, there is a likely larger than trace right pleural effusion. Recommend PA and lateral view of the chest for further evaluation given size of effusions on CT chest 10/20/20. 2. Persistent enlargement of the cardiac silhouette. Underlying pericardial effusion not excluded. 3. Mild pulmonary edema. Electronically Signed   By: Iven Finn M.D.   On: 11/25/2020 19:11       Assessment & Plan:    Principal Problem:   Acute renal failure (ARF) (HCC) Active Problems:   DM (diabetes mellitus) (HCC)   Hyperkalemia   Benign essential HTN   Hypomagnesemia   Cardiorenal syndrome   Acute renal failure With creatinine baseline around 5, increased to 6.3 today.  GFR known really around 64, 8 today Wants to follow-up with nephrology as there is a transportation barrier Plan to establish with nephrology locally during this hospital visit Plan for aggressive diuresis Renal diet Nephrology to see in the a.m. Avoid nephrotoxic agents when possible Trend in the a.m. Cardiorenal syndrome Elevated BNP Cardiomegaly, mild pulmonary edema on chest x-ray Aggressive diuresis Continue Coreg and Imdur Update echocardiogram Continue to monitor Acute on chronic anemia Likely secondary to chronic disease Hemoglobin 7.6 Transfusion goal 7.0 Patient agreeable to transfusion if needed Trend in the a.m. Protein calorie  malnutrition severe Albumin 2.1 Advised patient extensively on diet, nutrient dense foods, calorie control Patient reports  understanding Hypertension Continue amlodipine, Coreg, clonidine, hydralazine Continue to monitor Hyperkalemia Secondary to AKI Low, and aggressive diuresis Trend in the a.m. Hypomagnesemia Replace and recheck Obesity BMI is 60 Advised on the importance of weight loss    DVT Prophylaxis-   Heparin- SCDs   AM Labs Ordered, also please review Full Orders  Family Communication: No family at bedside  Code Status: Full  Admission status: Observation/Inpatient :The appropriate admission status for this patient is INPATIENT. Inpatient status is judged to be reasonable and necessary in order to provide the required intensity of service to ensure the patient's safety. The patient's presenting symptoms, physical exam findings, and initial radiographic and laboratory data in the context of their chronic comorbidities is felt to place them at high risk for further clinical deterioration. Furthermore, it is not anticipated that the patient will be medically stable for discharge from the hospital within 2 midnights of admission. The following factors support the admission status of inpatient.     The patient's presenting symptoms include anasarca. The worrisome physical exam findings include anasarca. The initial radiographic and laboratory data are worrisome because of pleural effusions, pulmonary edema, AKI. The chronic co-morbidities include diabetes mellitus, hypertension, chronic kidney disease.       * I certify that at the point of admission it is my clinical judgment that the patient will require inpatient hospital care spanning beyond 2 midnights from the point of admission due to high intensity of service, high risk for further deterioration and high frequency of surveillance required.*  Time spent in minutes : [place]

## 2020-11-26 NOTE — Progress Notes (Signed)
  Echocardiogram 2D Echocardiogram has been performed.  Sheila Austin 11/26/2020, 12:07 PM

## 2020-11-27 DIAGNOSIS — N179 Acute kidney failure, unspecified: Secondary | ICD-10-CM | POA: Diagnosis not present

## 2020-11-27 DIAGNOSIS — N185 Chronic kidney disease, stage 5: Secondary | ICD-10-CM | POA: Diagnosis not present

## 2020-11-27 DIAGNOSIS — N049 Nephrotic syndrome with unspecified morphologic changes: Secondary | ICD-10-CM | POA: Diagnosis not present

## 2020-11-27 DIAGNOSIS — I10 Essential (primary) hypertension: Secondary | ICD-10-CM | POA: Diagnosis not present

## 2020-11-27 LAB — RENAL FUNCTION PANEL
Albumin: 2 g/dL — ABNORMAL LOW (ref 3.5–5.0)
Anion gap: 8 (ref 5–15)
BUN: 71 mg/dL — ABNORMAL HIGH (ref 6–20)
CO2: 16 mmol/L — ABNORMAL LOW (ref 22–32)
Calcium: 6.4 mg/dL — CL (ref 8.9–10.3)
Chloride: 112 mmol/L — ABNORMAL HIGH (ref 98–111)
Creatinine, Ser: 6.36 mg/dL — ABNORMAL HIGH (ref 0.44–1.00)
GFR, Estimated: 8 mL/min — ABNORMAL LOW (ref >60)
Glucose, Bld: 99 mg/dL (ref 70–99)
Phosphorus: 7.6 mg/dL — ABNORMAL HIGH (ref 2.5–4.6)
Potassium: 5.5 mmol/L — ABNORMAL HIGH (ref 3.5–5.1)
Sodium: 136 mmol/L (ref 135–145)

## 2020-11-27 LAB — IRON AND TIBC
Iron: 28 ug/dL (ref 28–170)
Saturation Ratios: 16 % (ref 10.4–31.8)
TIBC: 173 ug/dL — ABNORMAL LOW (ref 250–450)
UIBC: 145 ug/dL

## 2020-11-27 LAB — GLUCOSE, CAPILLARY
Glucose-Capillary: 100 mg/dL — ABNORMAL HIGH (ref 70–99)
Glucose-Capillary: 103 mg/dL — ABNORMAL HIGH (ref 70–99)
Glucose-Capillary: 107 mg/dL — ABNORMAL HIGH (ref 70–99)
Glucose-Capillary: 127 mg/dL — ABNORMAL HIGH (ref 70–99)

## 2020-11-27 LAB — FERRITIN: Ferritin: 164 ng/mL (ref 11–307)

## 2020-11-27 NOTE — Progress Notes (Signed)
PROGRESS NOTE    Sheila Austin  [id number] DOB: 11/14/1985 DOA: 11/25/2020 PCP: Pcp, No    Chief Complaint  Patient presents with   Weakness    Brief admission Narrative:  As per H&P written by Dr. Clearence Ped on 11/26/20 35 y.o. female,with hx of CHF, DMII, HTN, GERD, morbid obesity, and chronic kidney disease presents to the ED with a chief complaint of peripheral edema.  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  She reports that she has a bariatric wheelchair at home, but she and her fianc live in a mobile home.  There are parts of the home, including the bathroom, where the bariatric wheelchair cannot access.  Patient usually crawls to the bathroom.  She reports that she has had increased swelling and peripheral edema, but she really started to notice it today, because she could not bend her knee to crawl on the floor.  She reports she can even get off the couch.  She also notes that she has had a decrease in urine output.  The symptoms have been associated with dyspnea on exertion, orthopnea, wheeze, and palpitations that last minutes.  Her symptoms are worse with exertion, better with rest.  She reports no cough.  She does have 2 L nasal cannula prescribed at home to wear 24/7.  She says she wears it at night and then she wears it when she needs it.  She has not been wearing it today.  Patient denies any fevers.  She has not had any dysuria or hematuria.  Patient is not on dialysis but her nephrologist in [place] has discussed this with her.  Dialysis discussion started in May 2022 and patient has been lost to follow-up since then as she does not have transport to get to her nephrologist in [place].  The discussion seem to be around peritoneal dialysis per patient's description.  Assessment & Plan: 1-Acute on chronic renal failure (ARF) (HCC) -stage 5 at baseline. -patient presented with massive fluid overload, hyperkalemia and  metabolic acidosis. -At this moment Case has been discussed with nephrology service and anticipation is for dialysis initiation eventually.  Patient is in agreement with pursuing dialysis if required. -Maximizing IV Lasix and continued use of metolazone as dictated by nephrology service.   -will continue sodium bicarbonate and Lokelma. -Follow clinical response. -Continue to follow electrolytes trend.  2-hypertension -In the setting of fluid overload most likely -Continue max dose of IV diuretics -Continue to follow vital signs. -If needed will add hydralazine -Norvasc has been discontinued. -Low-sodium/renal diet discussed with patient.  3-type 2 diabetes with nephropathy -Continue sliding scale insulin and follow CBGs fluctuation. -Modify carbohydrate diet discussed with patient.  4-anemia of chronic kidney disease -Follow iron panel -IV iron therapy and Epogen as per nephrology discretion. -Hemoglobin 7.3 -No overt bleeding.  5-secondary hyperparathyroidism/hypocalcemia -Calcium gluconate given on 11/26/2020; repeat calcium today 6.4 but corrected for her hypoalbuminemia 8.0. -Continue monitoring electrolytes trend and continue telemetry. -PhosLo has been added -Follow electrolytes trend.  6-super morbid obesity -Body mass index is 70.48 kg/m. -Low calorie diet, portion control and increase physical activity discussed with patient.  DVT prophylaxis: Heparin  Code Status: Full Code. Family Communication: no family at bedside. Disposition:   Status is: Inpatient  Remains inpatient appropriate because:Ongoing diagnostic testing needed not appropriate for outpatient work up  Dispo: The patient is from: Home              Anticipated d/c is to: To  be determined              Patient currently is not medically stable to d/c.   Difficult to place patient No    Consultants:  Nephrology service  Procedures:  See below for x-ray reports.  Antimicrobials:   None   Subjective: No fever, no chest pain, no nausea or vomiting.  Reporting shortness of breath with minimal activities and mild orthopnea.  No oxygen supplementation in place currently.  1.9 L of urine reported in the last 24 hours.  Still with significant fluid overload/anasarca status on examination.  Objective: Vitals:   11/26/20 1402 11/26/20 2123 11/27/20 0446 11/27/20 0804  BP: 123/71 125/73 117/78 121/69  Pulse: 81 78 74 74  Resp: '16 20 14   '$ Temp: 97.7 F (36.5 C) 97.9 F (36.6 C) 97.9 F (36.6 C)   TempSrc: Oral Oral Oral   SpO2: 95% 98% 97% 98%  Weight:   (!) 174.8 kg   Height:        Intake/Output Summary (Last 24 hours) at 11/27/2020 0916 Last data filed at 11/27/2020 0500 Gross per 24 hour  Intake 880.77 ml  Output 2850 ml  Net -1969.23 ml   Filed Weights   11/25/20 1627 11/26/20 0227 11/27/20 0446  Weight: (!) 149.7 kg (!) 173.6 kg (!) 174.8 kg    Examination: General exam: Alert, awake, oriented x 3; denies chest pain and expressed breathing is a stable.  No fever.  Markedly fluid overload/anasarca appreciated on exam. Respiratory system: No wheezing, no using accessory muscles; decreased breath sounds at the bases.  Patient reports mild orthopnea. Cardiovascular system:RRR. No rubs, murmurs/gallops; unable to assess JVD with body habitus. Gastrointestinal system: Abdomen is obese, nontender on palpation; positive bowel sounds.   Central nervous system: Alert and oriented. No focal neurological deficits. Extremities: No cyanosis or clubbing; 3+ edema appreciated bilaterally; positive right BKA. Skin: No petechiae. Psychiatry: Judgement and insight appear normal. Mood & affect appropriate.    Data Reviewed: I have personally reviewed following labs and imaging studies  CBC: Recent Labs  Lab 11/25/20 1801 11/26/20 0806  WBC 9.6 7.6  HGB 7.6* 7.3*  HCT 24.9* 24.3*  MCV 95.8 96.4  PLT 204 0000000    Basic Metabolic Panel: Recent Labs  Lab  11/25/20 1801 11/26/20 0806 11/27/20 0559  NA 135 137 136  K 5.8* 5.7* 5.5*  CL 115* 114* 112*  CO2 14* 15* 16*  GLUCOSE 150* 104* 99  BUN 70* 68* 71*  CREATININE 6.83* 6.54* 6.36*  CALCIUM 6.0* 6.2* 6.4*  MG 1.6* 1.5*  --   PHOS  --  7.6* 7.6*    GFR: Estimated Creatinine Clearance: 20 mL/min (A) (by C-G formula based on SCr of 6.36 mg/dL (H)).  Liver Function Tests: Recent Labs  Lab 11/25/20 1801 11/26/20 0806 11/27/20 0559  AST 15 10*  --   ALT 9 10  --   ALKPHOS 189* 171*  --   BILITOT 0.6 0.7  --   PROT 6.1* 5.7*  --   ALBUMIN 2.3* 2.1* 2.0*    CBG: Recent Labs  Lab 11/26/20 0721 11/26/20 1116 11/26/20 1611 11/26/20 2120 11/27/20 0727  GLUCAP 101* 112* 139* 149* 100*     Recent Results (from the past 240 hour(s))  SARS CORONAVIRUS 2 (TAT 6-24 HRS) Nasopharyngeal Nasopharyngeal Swab     Status: None   Collection Time: 11/25/20  8:00 PM   Specimen: Nasopharyngeal Swab  Result Value Ref Range Status   SARS  Coronavirus 2 NEGATIVE NEGATIVE Final    Comment: (NOTE) SARS-CoV-2 target nucleic acids are NOT DETECTED.  The SARS-CoV-2 RNA is generally detectable in upper and lower respiratory specimens during the acute phase of infection. Negative results do not preclude SARS-CoV-2 infection, do not rule out co-infections with other pathogens, and should not be used as the sole basis for treatment or other patient management decisions. Negative results must be combined with clinical observations, patient history, and epidemiological information. The expected result is Negative.  Fact Sheet for Patients: SugarRoll.be  Fact Sheet for Healthcare Providers: https://www.woods-mathews.com/  This test is not yet approved or cleared by the [place] FDA and  has been authorized for detection and/or diagnosis of SARS-CoV-2 by FDA under an Emergency Use Authorization (EUA). This EUA will remain  in effect (meaning  this test can be used) for the duration of the COVID-19 declaration under Se ction 564(b)(1) of the Act, 21 U.S.C. section 360bbb-3(b)(1), unless the authorization is terminated or revoked sooner.  Performed at [facility], [place] [address] [place], [place] [address]      Radiology Studies: Portable chest 1 View  Result Date: 11/26/2020 CLINICAL DATA:  Pleural effusion. EXAM: PORTABLE CHEST 1 VIEW COMPARISON:  November 25, 2020. FINDINGS: Stable cardiomediastinal silhouette. Mild central pulmonary vascular congestion may be present. Right basilar opacity is noted concerning for possible atelectasis and effusion. Bony thorax is unremarkable. IMPRESSION: Mild central pulmonary vascular congestion. Right basilar opacity is noted concerning for possible atelectasis and pleural effusion. Electronically Signed   By: Marijo Conception M.D.   On: 11/26/2020 10:10   DG Chest Portable 1 View  Result Date: 11/25/2020 CLINICAL DATA:  Edema. The patient states generalized weakness and swelling to lower limbs in the last 4 days. States SOB on exertion/ slight movement. Hx of diabetes, pulmonary edema, CHF, and HTN. Not being tested for covid-19 EXAM: PORTABLE CHEST 1 VIEW.  Patient is rotated. COMPARISON:  CT abdomen pelvis 10/20/2020 FINDINGS: Enlarged cardiac silhouette. The heart size and mediastinal contours are otherwise unchanged. Again noted is slightly more lucent left hemithorax compared to the right. No focal consolidation. Mild pulmonary edema. No pneumothorax. No acute osseous abnormality. IMPRESSION: 1. Bilateral pleural effusions that are difficult to evaluate on this AP semi upright view. Given opacity overlying the right hemithorax, there is a likely larger than trace right pleural effusion. Recommend PA and lateral view of the chest for further evaluation given size of effusions on CT chest 10/20/20. 2. Persistent enlargement of the cardiac silhouette. Underlying pericardial effusion not excluded.  3. Mild pulmonary edema. Electronically Signed   By: Iven Finn M.D.   On: 11/25/2020 19:11   ECHOCARDIOGRAM COMPLETE  Result Date: 11/26/2020    ECHOCARDIOGRAM REPORT   Patient Name:   Sheila Austin Date of Exam: 11/26/2020 Medical Rec #:  [id number]        Height:       62.0 in Accession #:    [id number]       Weight:       382.7 lb Date of Birth:  1986/03/13        BSA:          2.519 m Patient Age:    34 years         BP:           181/96 mmHg Patient Gender: F                HR:  80 bpm. Exam Location:  [facility] Procedure: 2D Echo, Cardiac Doppler and Color Doppler Indications:    CHF  History:        Patient has prior history of Echocardiogram examinations, most                 recent 09/24/2020. CHF; Risk Factors:Hypertension, Diabetes and                 Dyslipidemia.  Sonographer:    Wenda Low Referring Phys: [id number] ASIA B [place]  1. Left ventricular ejection fraction, by estimation, is 60 to 65%. The left ventricle has normal function. The left ventricle has no regional wall motion abnormalities. There is mild concentric left ventricular hypertrophy. Left ventricular diastolic parameters are indeterminate.  2. Prominent moderator band present. Right ventricular systolic function is severely reduced. The right ventricular size is severely enlarged. There is mildly elevated pulmonary artery systolic pressure.  3. A small pericardial effusion is present. The pericardial effusion is circumferential.  4. The mitral valve is normal in structure. Trivial mitral valve regurgitation. No evidence of mitral stenosis.  5. Tricuspid valve regurgitation is mild to moderate.  6. The aortic valve is normal in structure. Aortic valve regurgitation is not visualized. No aortic stenosis is present.  7. The inferior vena cava is normal in size with greater than 50% respiratory variability, suggesting right atrial pressure of 3 mmHg. FINDINGS  Left Ventricle: Left ventricular  ejection fraction, by estimation, is 60 to 65%. The left ventricle has normal function. The left ventricle has no regional wall motion abnormalities. The left ventricular internal cavity size was normal in size. There is  mild concentric left ventricular hypertrophy. Left ventricular diastolic parameters are indeterminate. Normal left ventricular filling pressure. Right Ventricle: Prominent moderator band present. The right ventricular size is severely enlarged. No increase in right ventricular wall thickness. Right ventricular systolic function is severely reduced. There is mildly elevated pulmonary artery systolic pressure. The tricuspid regurgitant velocity is 2.88 m/s, and with an assumed right atrial pressure of 8 mmHg, the estimated right ventricular systolic pressure is [id number] mmHg. Left Atrium: Left atrial size was normal in size. Right Atrium: Right atrial size was normal in size. Pericardium: A small pericardial effusion is present. The pericardial effusion is circumferential. Mitral Valve: The mitral valve is normal in structure. There is mild thickening of the mitral valve leaflet(s). Trivial mitral valve regurgitation. No evidence of mitral valve stenosis. MV peak gradient, 8.0 mmHg. The mean mitral valve gradient is 2.0 mmHg. Tricuspid Valve: The tricuspid valve is normal in structure. Tricuspid valve regurgitation is mild to moderate. No evidence of tricuspid stenosis. Aortic Valve: The aortic valve is normal in structure. Aortic valve regurgitation is not visualized. No aortic stenosis is present. Aortic valve mean gradient measures 6.0 mmHg. Aortic valve peak gradient measures 8.8 mmHg. Aortic valve area, by VTI measures 2.01 cm. Pulmonic Valve: The pulmonic valve was normal in structure. Pulmonic valve regurgitation is trivial. No evidence of pulmonic stenosis. Aorta: The aortic root is normal in size and structure. Venous: The inferior vena cava is normal in size with greater than 50% respiratory  variability, suggesting right atrial pressure of 3 mmHg. IAS/Shunts: No atrial level shunt detected by color flow Doppler.  LEFT VENTRICLE PLAX 2D LVIDd:         4.71 cm  Diastology LVIDs:         3.45 cm  LV e' medial:    0.13 cm/s LV PW:  1.26 cm  LV E/e' medial:  11.1 LV IVS:        1.24 cm  LV e' lateral:   0.10 cm/s LVOT diam:     2.00 cm  LV E/e' lateral: 14.5 LV SV:         68 LV SV Index:   27 LVOT Area:     3.14 cm  RIGHT VENTRICLE RV Basal diam:  4.05 cm RV Mid diam:    4.65 cm LEFT ATRIUM             Index       RIGHT ATRIUM           Index LA diam:        4.50 cm 1.79 cm/m  RA Area:     18.50 cm LA Vol (A2C):   37.9 ml 15.05 ml/m RA Volume:   49.20 ml  19.53 ml/m LA Vol (A4C):   52.5 ml 20.84 ml/m LA Biplane Vol: 46.5 ml 18.46 ml/m  AORTIC VALVE AV Area (Vmax):    2.29 cm AV Area (Vmean):   1.99 cm AV Area (VTI):     2.01 cm AV Vmax:           148.00 cm/s AV Vmean:          119.000 cm/s AV VTI:            0.339 m AV Peak Grad:      8.8 mmHg AV Mean Grad:      6.0 mmHg LVOT Vmax:         108.00 cm/s LVOT Vmean:        75.400 cm/s LVOT VTI:          0.217 m LVOT/AV VTI ratio: 0.64  AORTA Ao Root diam: 3.20 cm Ao Asc diam:  2.80 cm MITRAL VALVE               TRICUSPID VALVE MV Area (PHT): 5.66 cm    TR Peak grad:   33.2 mmHg MV Area VTI:   2.53 cm    TR Vmax:        288.00 cm/s MV Peak grad:  8.0 mmHg MV Mean grad:  2.0 mmHg    SHUNTS MV Vmax:       1.41 m/s    Systemic VTI:  0.22 m MV Vmean:      60.2 cm/s   Systemic Diam: 2.00 cm MV Decel Time: 134 msec MV E velocity: 1.45 cm/s MV A velocity: 65.30 cm/s MV E/A ratio:  0.02 Fransico Him MD Electronically signed by Fransico Him MD Signature Date/Time: 11/26/2020/12:32:16 PM    Final      Scheduled Meds:  calcium acetate  1,334 mg Oral TID WC   carvedilol  12.5 mg Oral BID   Chlorhexidine Gluconate Cloth  6 each Topical Daily   cloNIDine  0.1 mg Transdermal Q Sun   hydrALAZINE  100 mg Oral TID   insulin aspart  0-15 Units  Subcutaneous TID WC   insulin aspart  0-5 Units Subcutaneous QHS   insulin glargine  7 Units Subcutaneous QHS   isosorbide mononitrate  30 mg Oral Daily   levETIRAcetam  500 mg Oral BID   metolazone  10 mg Oral Daily   sodium bicarbonate  650 mg Oral BID   tamsulosin  0.4 mg Oral QPC supper   Continuous Infusions:  furosemide 160 mg (11/27/20 0810)     LOS: 2 days    Time spent: 35  minutes.    Barton Dubois, MD [facility]   To contact the attending provider between 7A-7P or the covering provider during after hours 7P-7A, please log into the web site [email or web address] and access using universal [place] password for that web site. If you do not have the password, please call the hospital operator.  11/27/2020, 9:16 AM

## 2020-11-27 NOTE — Progress Notes (Signed)
CRITICAL VALUE STICKER  CRITICAL VALUE: Calcium 6.4  RECEIVER (on-site recipient of call): W. Otho Michalik  DATE & TIME NOTIFIED: 7/24 @ 0848  MESSENGER (representative from lab): Katie  MD NOTIFIED: Dyann Kief  TIME OF NOTIFICATION: [id number]  RESPONSE: No new orders

## 2020-11-28 DIAGNOSIS — N185 Chronic kidney disease, stage 5: Secondary | ICD-10-CM | POA: Diagnosis not present

## 2020-11-28 DIAGNOSIS — N049 Nephrotic syndrome with unspecified morphologic changes: Secondary | ICD-10-CM | POA: Diagnosis not present

## 2020-11-28 DIAGNOSIS — N179 Acute kidney failure, unspecified: Secondary | ICD-10-CM | POA: Diagnosis not present

## 2020-11-28 DIAGNOSIS — I10 Essential (primary) hypertension: Secondary | ICD-10-CM | POA: Diagnosis not present

## 2020-11-28 LAB — RENAL FUNCTION PANEL
Albumin: 2 g/dL — ABNORMAL LOW (ref 3.5–5.0)
Anion gap: 7 (ref 5–15)
BUN: 75 mg/dL — ABNORMAL HIGH (ref 6–20)
CO2: 18 mmol/L — ABNORMAL LOW (ref 22–32)
Calcium: 6.4 mg/dL — CL (ref 8.9–10.3)
Chloride: 110 mmol/L (ref 98–111)
Creatinine, Ser: 6.76 mg/dL — ABNORMAL HIGH (ref 0.44–1.00)
GFR, Estimated: 8 mL/min — ABNORMAL LOW (ref >60)
Glucose, Bld: 96 mg/dL (ref 70–99)
Phosphorus: 7.7 mg/dL — ABNORMAL HIGH (ref 2.5–4.6)
Potassium: 5.6 mmol/L — ABNORMAL HIGH (ref 3.5–5.1)
Sodium: 135 mmol/L (ref 135–145)

## 2020-11-28 LAB — GLUCOSE, CAPILLARY
Glucose-Capillary: 89 mg/dL (ref 70–99)
Glucose-Capillary: 109 mg/dL — ABNORMAL HIGH (ref 70–99)
Glucose-Capillary: 124 mg/dL — ABNORMAL HIGH (ref 70–99)
Glucose-Capillary: 125 mg/dL — ABNORMAL HIGH (ref 70–99)

## 2020-11-28 MED ORDER — HYDRALAZINE HCL 25 MG PO TABS
50.0000 mg | ORAL_TABLET | Freq: Three times a day (TID) | ORAL | Status: DC
  Administered 2020-11-28 – 2020-12-06 (×24): 50 mg via ORAL
  Filled 2020-11-28 (×28): qty 2

## 2020-11-28 MED ORDER — SODIUM BICARBONATE 650 MG PO TABS
650.0000 mg | ORAL_TABLET | Freq: Three times a day (TID) | ORAL | Status: DC
  Administered 2020-11-28 – 2020-12-07 (×27): 650 mg via ORAL
  Filled 2020-11-28 (×28): qty 1

## 2020-11-28 MED ORDER — SODIUM CHLORIDE 0.9 % IV SOLN
250.0000 mg | Freq: Every day | INTRAVENOUS | Status: AC
  Administered 2020-11-28 – 2020-12-01 (×4): 250 mg via INTRAVENOUS
  Filled 2020-11-28 (×5): qty 20

## 2020-11-28 MED ORDER — DARBEPOETIN ALFA 200 MCG/0.4ML IJ SOSY
200.0000 ug | PREFILLED_SYRINGE | INTRAMUSCULAR | Status: DC
  Administered 2020-11-28: 200 ug via SUBCUTANEOUS
  Filled 2020-11-28 (×2): qty 0.4

## 2020-11-28 MED ORDER — SODIUM CHLORIDE 0.9 % IV SOLN
250.0000 mg | Freq: Every day | INTRAVENOUS | Status: DC
  Filled 2020-11-28 (×2): qty 20

## 2020-11-28 MED ORDER — SODIUM ZIRCONIUM CYCLOSILICATE 5 G PO PACK: 5.0000 g | PACK | Freq: Every day | ORAL | Status: DC

## 2020-11-28 MED ORDER — SODIUM ZIRCONIUM CYCLOSILICATE 10 G PO PACK
10.0000 g | PACK | Freq: Two times a day (BID) | ORAL | Status: DC
  Administered 2020-11-28 – 2020-11-29 (×3): 10 g via ORAL
  Filled 2020-11-28 (×5): qty 1

## 2020-11-28 NOTE — Consult Note (Signed)
Spoke with Dr Moshe Cipro and Dr Donnetta Hutching.  He will try to add her on to the OR schedule tomorrow for a tunneled catheter and fistula electively.  Will order vein map Will make pt NPO after midnight  Please feel free to call me if questions.  Ruta Hinds, MD [facility] Office: [phone]

## 2020-11-28 NOTE — TOC Initial Note (Signed)
Transition of Care [facility]) - Initial/Assessment Note    Patient Details  Name: Sheila Austin MRN: [id number] Date of Birth: 1985/06/23  Transition of Care [facility]) CM/SW Contact:    Natasha Bence, LCSW Phone Number: 11/28/2020, 4:36 PM  Clinical Narrative:                 Patient is a 35 year old female admitted for Acute renal failure (ARF). CSW observed patient's high readmission risk score. CSW conducted risk readmission risk assessment and initial assessment. Patient reported that she is able to preform upper body ADL's independently such as cooking, but needs assistance for ADL's such as bathing and toileting. Patient reported that she is not agreeable to SNF, but would be agreeable to [facility] if recommended. CSW informed patient that patient may not be able to receive HH due to insurance, but CSW will follow up to notify pt if [facility] is able to take patient. TOC to follow.  Expected Discharge Plan: [place] Barriers to Discharge: Continued Medical Work up   Patient Goals and CMS Choice Patient states their goals for this hospitalization and ongoing recovery are:: Return Home with HH. CMS Medicare.gov Compare Post Acute Care list provided to:: Patient Choice offered to / list presented to : Patient  Expected Discharge Plan and Services Expected Discharge Plan: [place]                                              Prior Living Arrangements/Services   Lives with:: Self, Spouse Patient language and need for interpreter reviewed:: Yes Do you feel safe going back to the place where you live?: Yes      Need for Family Participation in Patient Care: Yes (Comment) Care giver support system in place?: Yes (comment)   Criminal Activity/Legal Involvement Pertinent to Current Situation/Hospitalization: No - Comment as needed  Activities of Daily Living Home Assistive Devices/Equipment: Oxygen (uses 2l of o2 at home) ADL Screening (condition at  time of admission) Patient's cognitive ability adequate to safely complete daily activities?: Yes Is the patient deaf or have difficulty hearing?: No Does the patient have difficulty seeing, even when wearing glasses/contacts?: Yes (uses magnifying glass to see things 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) Does the patient have difficulty concentrating, remembering, or making decisions?: No Patient able to express need for assistance with ADLs?: Yes Does the patient have difficulty dressing or bathing?: Yes Independently performs ADLs?: No Communication: Needs assistance Is this a change from baseline?: Pre-admission baseline Dressing (OT): Needs assistance Is this a change from baseline?: Pre-admission baseline Grooming: Needs assistance Is this a change from baseline?: Pre-admission baseline Feeding: Independent Is this a change from baseline?: Pre-admission baseline Bathing: Needs assistance, Dependent Is this a change from baseline?: Pre-admission baseline Toileting: Independent with device (comment), Needs assistance, Dependent (has foley placed in ed on admission) In/Out Bed: Needs assistance, Dependent Is this a change from baseline?: Pre-admission baseline Walks in Home: Needs assistance Does the patient have difficulty walking or climbing stairs?: Yes Weakness of Legs: Left Weakness of Arms/Hands: Both  Permission Sought/Granted Permission sought to share information with : Family Supports Permission granted to share information with : Yes, Verbal Permission Granted  Share Information with NAME: Nance,Otis  Permission granted to share info w AGENCY: [place]  Permission granted to share info w Relationship: (Significant other)  Permission granted to share info w Contact Information: [phone]  Emotional Assessment     Affect (typically observed): Adaptable, Appropriate, Accepting Orientation: : Oriented to Self, Oriented to Situation,  Oriented to Place Alcohol / Substance Use: Not Applicable Psych Involvement: No (comment)  Admission diagnosis:  Hyperkalemia [E87.5] Acute renal failure (ARF) (HCC) [N17.9] Acute renal failure, unspecified acute renal failure type ([place]) [N17.9] Anasarca associated with disorder of kidney [N04.9] 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 chronic kidney disease, not on chronic dialysis ([place]) [id number], D63.1] Patient Active Problem List   Diagnosis Date Noted   Hypomagnesemia 11/26/2020   Cardiorenal syndrome 11/26/2020   Acute renal failure (ARF) ([place]) 11/25/2020   Acute exacerbation of CHF (congestive heart failure) ([place]) 10/20/2020   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/20/2020   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   SIRS (systemic inflammatory response syndrome) ([place]) 10/20/2020   Poor venous access    Morbid obesity with BMI of 60.0-69.9, adult ([place]) 09/27/2020   Leukocytosis 09/24/2020   Hyperphosphatemia 09/24/2020   Hypoalbuminemia due to protein-calorie malnutrition ([place]) 09/24/2020   Elevated lipase 09/24/2020   Elevated troponin 09/24/2020   Noncompliance with medication regimen 09/24/2020   Acute renal failure superimposed on chronic kidney disease ([place]) 09/24/2020   Hypertensive crisis 09/24/2020   Acute urinary retention 09/24/2020   Nausea & vomiting 10/10/2019   Hypokalemia 10/10/2019   CKD (chronic kidney disease), stage III ([place]) 10/10/2019   CKD (chronic kidney disease), stage III ([place]) 10/10/2019   Uncontrolled type 2 diabetes mellitus with hyperglycemia, without long-term current use of insulin ([place]) 08/23/2019   Intractable nausea and vomiting 08/23/2019   GERD with esophagitis 08/23/2019   Lactic acidosis 08/23/2019   Hyperosmolar hyperglycemic state (HHS) ([place]) 05/16/2019   AKI (acute kidney injury) ([place]) 05/16/2019   GERD (gastroesophageal reflux disease)    Hypertensive urgency    Benign essential HTN    Unilateral complete BKA,  right, sequela (HCC)    Hyperkalemia    Unilateral complete BKA, right, initial encounter ([place]) 02/09/2019   Acquired absence of right leg below knee (HCC)    Hyponatremia    Acute blood loss anemia    Sinus tachycardia    Bacteremia    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HCC)    Streptococcal bacteremia    Klebsiella pneumoniae infection    Abscess of leg, right    Skin ulcer of right foot with necrosis of muscle (HCC)    Severe protein-calorie malnutrition (HCC)    DM (diabetes mellitus) ([place]) 02/01/2019   Adjustment disorder 02/01/2019   Sepsis due to cellulitis ([place]) 02/01/2019   Cellulitis in diabetic foot ([place]) 12/31/2018   Obesity 12/31/2018   Facial cellulitis 07/02/2018   Class 3 severe obesity with body mass index (BMI) of 40.0 to 44.9 in adult ([place]) 07/09/2017   Abscess of right breast 02/24/2016   BMI 45.0-49.9, adult ([place]) 09/29/2015   Morbid obesity with BMI of 50.0-59.9, adult ([place]) 10/05/2011   Pulmonary edema 10/05/2011   Obstructive sleep apnea 10/05/2011   HLD (hyperlipidemia) 09/27/2011   Migraine 09/07/2011   Essential hypertension, benign 11/01/2008   Gastroparesis 09/27/2008   IRREGULAR MENSES 08/24/2008   Diabetes mellitus out of control ([place]) 07/04/2006   PCP:  Pcp, No Pharmacy:   [id number] [id number]: [phone]: [phone]    Social Determinants of Health (  SDOH) Interventions    Readmission Risk Interventions Readmission Risk Prevention Plan 11/28/2020  Transportation Screening Complete  PCP or Specialist Appt within 3-5 Days Complete  HRI or [place] Complete  Social Work Consult for [place] Planning/Counseling Complete  Palliative Care Screening Not Applicable  Medication Review Press photographer) Complete  Some recent data might be hidden

## 2020-11-28 NOTE — Progress Notes (Signed)
PROGRESS NOTE    Sheila Austin  [id number] DOB: February 16, 1986 DOA: 11/25/2020 PCP: Pcp, No    Chief Complaint  Patient presents with   Weakness    Brief admission Narrative:  As per H&P written by Dr. Clearence Ped on 11/26/20 35 y.o. female,with hx of CHF, DMII, HTN, GERD, morbid obesity, and chronic kidney disease presents to the ED with a chief complaint of peripheral edema.  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  She reports that she has a bariatric wheelchair at home, but she and her fianc live in a mobile home.  There are parts of the home, including the bathroom, where the bariatric wheelchair cannot access.  Patient usually crawls to the bathroom.  She reports that she has had increased swelling and peripheral edema, but she really started to notice it today, because she could not bend her knee to crawl on the floor.  She reports she can even get off the couch.  She also notes that she has had a decrease in urine output.  The symptoms have been associated with dyspnea on exertion, orthopnea, wheeze, and palpitations that last minutes.  Her symptoms are worse with exertion, better with rest.  She reports no cough.  She does have 2 L nasal cannula prescribed at home to wear 24/7.  She says she wears it at night and then she wears it when she needs it.  She has not been wearing it today.  Patient denies any fevers.  She has not had any dysuria or hematuria.  Patient is not on dialysis but her nephrologist in [place] has discussed this with her.  Dialysis discussion started in May 2022 and patient has been lost to follow-up since then as she does not have transport to get to her nephrologist in [place].  The discussion seem to be around peritoneal dialysis per patient's description.  Assessment & Plan: 1-Acute on chronic renal failure (ARF) (HCC) -stage 5 at baseline. -patient presented with massive fluid overload, hyperkalemia and  metabolic acidosis. -At this moment Case has been discussed with nephrology service and anticipation is for dialysis initiation eventually.  Patient is in agreement with pursuing dialysis if required. -continue Maximizing IV Lasix and continued use of metolazone as dictated by nephrology service.   -vascular surgery consulted and planning [facility] and permanent acess placement on 11/29/20. -will continue sodium bicarbonate and Lokelma; K level 5.6. -patient is still making urin. -Follow clinical response. -Continue to follow electrolytes trend.  2-hypertension -In the setting of fluid overload most likely -Continue max dose of IV diuretics -Continue to follow vital signs. -If needed will add hydralazine -Norvasc has been discontinued. -Low-sodium/renal diet discussed with patient.  3-type 2 diabetes with nephropathy -Continue sliding scale insulin and follow CBGs fluctuation. -Modify carbohydrate diet discussed with patient.  4-anemia of chronic kidney disease -Follow iron panel -IV iron therapy and Epogen as per nephrology discretion. -Last hemoglobin check 7.3; will repeat CBC in am. -No overt bleeding appreciated.Marland Kitchen  5-secondary hyperparathyroidism/hypocalcemia -Calcium gluconate given on 11/26/2020; repeat calcium today 6.4 but corrected for her hypoalbuminemia 8.0. -Continue monitoring electrolytes trend and continue telemetry. -PhosLo has been added -Follow electrolytes trend.  6-super morbid obesity -Body mass index is 69.72 kg/m. -Low calorie diet, portion control and increase physical activity discussed with patient.  DVT prophylaxis: Heparin  Code Status: Full Code. Family Communication: no family at bedside. Disposition:   Status is: Inpatient  Remains inpatient appropriate because:Ongoing diagnostic testing needed not appropriate  for outpatient work up  Dispo: The patient is from: Home              Anticipated d/c is to: To be determined              Patient  currently is not medically stable to d/c.   Difficult to place patient No Yet.    Consultants:  Nephrology service Vascular surgery   Procedures:  See below for x-ray reports.  Antimicrobials:  None   Subjective: No chest pain, no nausea, no vomiting.  Still intermittently experiencing orthopnea.  Patient markedly fluid overload on examination.  Objective: Vitals:   11/27/20 1352 11/27/20 2130 11/28/20 0534 11/28/20 1053  BP: 122/75 (!) 143/84 138/76 (!) 144/78  Pulse: 76 80 78 79  Resp:  18 18   Temp: 98.1 F (36.7 C) 98.3 F (36.8 C) 98.7 F (37.1 C)   TempSrc: Oral Oral Oral   SpO2: 96% 97% 98%   Weight:   (!) 172.9 kg   Height:        Intake/Output Summary (Last 24 hours) at 11/28/2020 1142 Last data filed at 11/28/2020 0900 Gross per 24 hour  Intake 1162.58 ml  Output 2250 ml  Net -1087.42 ml   Filed Weights   11/26/20 0227 11/27/20 0446 11/28/20 0534  Weight: (!) 173.6 kg (!) 174.8 kg (!) 172.9 kg    Examination: General exam: Alert, awake, oriented x 3; patient denies chest pain.  Still markedly fluid overloaded on examination and expressing intermittent orthopnea feelings.  No requiring oxygen supplementation. Respiratory system: Decreased breath sounds at the bases; unable to assess JVD.  Positive scattered rhonchi. Cardiovascular system:RRR.  No rubs, no gallops, no murmurs on exam. Gastrointestinal system: Abdomen is obese, soft and nontender to palpation.  Positive bowel sounds.   Central nervous system: Alert and oriented. No focal neurological deficits. Extremities: Right BKA; 3++ edema, no cyanosis, no clubbing. Skin: No petechiae. Psychiatry: Judgement and insight appear normal. Mood & affect appropriate.    Data Reviewed: I have personally reviewed following labs and imaging studies  CBC: Recent Labs  Lab 11/25/20 1801 11/26/20 0806  WBC 9.6 7.6  HGB 7.6* 7.3*  HCT 24.9* 24.3*  MCV 95.8 96.4  PLT 204 0000000    Basic Metabolic  Panel: Recent Labs  Lab 11/25/20 1801 11/26/20 0806 11/27/20 0559 11/28/20 0549  NA 135 137 136 135  K 5.8* 5.7* 5.5* 5.6*  CL 115* 114* 112* 110  CO2 14* 15* 16* 18*  GLUCOSE 150* 104* 99 96  BUN 70* 68* 71* 75*  CREATININE 6.83* 6.54* 6.36* 6.76*  CALCIUM 6.0* 6.2* 6.4* 6.4*  MG 1.6* 1.5*  --   --   PHOS  --  7.6* 7.6* 7.7*    GFR: Estimated Creatinine Clearance: 18.7 mL/min (A) (by C-G formula based on SCr of 6.76 mg/dL (H)).  Liver Function Tests: Recent Labs  Lab 11/25/20 1801 11/26/20 0806 11/27/20 0559 11/28/20 0549  AST 15 10*  --   --   ALT 9 10  --   --   ALKPHOS 189* 171*  --   --   BILITOT 0.6 0.7  --   --   PROT 6.1* 5.7*  --   --   ALBUMIN 2.3* 2.1* 2.0* 2.0*    CBG: Recent Labs  Lab 11/27/20 1109 11/27/20 1606 11/27/20 2140 11/28/20 0742 11/28/20 1115  GLUCAP 103* 107* 127* 89 109*     Recent Results (from the past 240 hour(s))  SARS CORONAVIRUS 2 (TAT 6-24 HRS) Nasopharyngeal Nasopharyngeal Swab     Status: None   Collection Time: 11/25/20  8:00 PM   Specimen: Nasopharyngeal Swab  Result Value Ref Range Status   SARS Coronavirus 2 NEGATIVE NEGATIVE Final    Comment: (NOTE) SARS-CoV-2 target nucleic acids are NOT DETECTED.  The SARS-CoV-2 RNA is generally detectable in upper and lower respiratory specimens during the acute phase of infection. Negative results do not preclude SARS-CoV-2 infection, do not rule out co-infections with other pathogens, and should not be used as the sole basis for treatment or other patient management decisions. Negative results must be combined with clinical observations, patient history, and epidemiological information. The expected result is Negative.  Fact Sheet for Patients: SugarRoll.be  Fact Sheet for Healthcare Providers: https://www.woods-mathews.com/  This test is not yet approved or cleared by the [place] FDA and  has been authorized for  detection and/or diagnosis of SARS-CoV-2 by FDA under an Emergency Use Authorization (EUA). This EUA will remain  in effect (meaning this test can be used) for the duration of the COVID-19 declaration under Se ction 564(b)(1) of the Act, 21 U.S.C. section 360bbb-3(b)(1), unless the authorization is terminated or revoked sooner.  Performed at [facility], [place] [address], [place],  [address]      Radiology Studies: ECHOCARDIOGRAM COMPLETE  Result Date: 11/26/2020    ECHOCARDIOGRAM REPORT   Patient Name:   Sheila Austin Date of Exam: 11/26/2020 Medical Rec #:  [id number]        Height:       62.0 in Accession #:    [id number]       Weight:       382.7 lb Date of Birth:  [birth year]        BSA:          2.519 m Patient Age:    57 years         BP:           181/96 mmHg Patient Gender: F                HR:           80 bpm. Exam Location:  [facility] Procedure: 2D Echo, Cardiac Doppler and Color Doppler Indications:    CHF  History:        Patient has prior history of Echocardiogram examinations, most                 recent 09/24/2020. CHF; Risk Factors:Hypertension, Diabetes and                 Dyslipidemia.  Sonographer:    Wenda Low Referring Phys: [id number] ASIA B [place]  1. Left ventricular ejection fraction, by estimation, is 60 to 65%. The left ventricle has normal function. The left ventricle has no regional wall motion abnormalities. There is mild concentric left ventricular hypertrophy. Left ventricular diastolic parameters are indeterminate.  2. Prominent moderator band present. Right ventricular systolic function is severely reduced. The right ventricular size is severely enlarged. There is mildly elevated pulmonary artery systolic pressure.  3. A small pericardial effusion is present. The pericardial effusion is circumferential.  4. The mitral valve is normal in structure. Trivial mitral valve regurgitation. No evidence of mitral stenosis.  5. Tricuspid  valve regurgitation is mild to moderate.  6. The aortic valve is normal in structure. Aortic valve regurgitation is not visualized. No aortic stenosis is present.  7. The inferior  vena cava is normal in size with greater than 50% respiratory variability, suggesting right atrial pressure of 3 mmHg. FINDINGS  Left Ventricle: Left ventricular ejection fraction, by estimation, is 60 to 65%. The left ventricle has normal function. The left ventricle has no regional wall motion abnormalities. The left ventricular internal cavity size was normal in size. There is  mild concentric left ventricular hypertrophy. Left ventricular diastolic parameters are indeterminate. Normal left ventricular filling pressure. Right Ventricle: Prominent moderator band present. The right ventricular size is severely enlarged. No increase in right ventricular wall thickness. Right ventricular systolic function is severely reduced. There is mildly elevated pulmonary artery systolic pressure. The tricuspid regurgitant velocity is 2.88 m/s, and with an assumed right atrial pressure of 8 mmHg, the estimated right ventricular systolic pressure is [id number] mmHg. Left Atrium: Left atrial size was normal in size. Right Atrium: Right atrial size was normal in size. Pericardium: A small pericardial effusion is present. The pericardial effusion is circumferential. Mitral Valve: The mitral valve is normal in structure. There is mild thickening of the mitral valve leaflet(s). Trivial mitral valve regurgitation. No evidence of mitral valve stenosis. MV peak gradient, 8.0 mmHg. The mean mitral valve gradient is 2.0 mmHg. Tricuspid Valve: The tricuspid valve is normal in structure. Tricuspid valve regurgitation is mild to moderate. No evidence of tricuspid stenosis. Aortic Valve: The aortic valve is normal in structure. Aortic valve regurgitation is not visualized. No aortic stenosis is present. Aortic valve mean gradient measures 6.0 mmHg. Aortic valve peak  gradient measures 8.8 mmHg. Aortic valve area, by VTI measures 2.01 cm. Pulmonic Valve: The pulmonic valve was normal in structure. Pulmonic valve regurgitation is trivial. No evidence of pulmonic stenosis. Aorta: The aortic root is normal in size and structure. Venous: The inferior vena cava is normal in size with greater than 50% respiratory variability, suggesting right atrial pressure of 3 mmHg. IAS/Shunts: No atrial level shunt detected by color flow Doppler.  LEFT VENTRICLE PLAX 2D LVIDd:         4.71 cm  Diastology LVIDs:         3.45 cm  LV e' medial:    0.13 cm/s LV PW:         1.26 cm  LV E/e' medial:  11.1 LV IVS:        1.24 cm  LV e' lateral:   0.10 cm/s LVOT diam:     2.00 cm  LV E/e' lateral: 14.5 LV SV:         68 LV SV Index:   27 LVOT Area:     3.14 cm  RIGHT VENTRICLE RV Basal diam:  4.05 cm RV Mid diam:    4.65 cm LEFT ATRIUM             Index       RIGHT ATRIUM           Index LA diam:        4.50 cm 1.79 cm/m  RA Area:     18.50 cm LA Vol (A2C):   37.9 ml 15.05 ml/m RA Volume:   49.20 ml  19.53 ml/m LA Vol (A4C):   52.5 ml 20.84 ml/m LA Biplane Vol: 46.5 ml 18.46 ml/m  AORTIC VALVE AV Area (Vmax):    2.29 cm AV Area (Vmean):   1.99 cm AV Area (VTI):     2.01 cm AV Vmax:           148.00 cm/s AV Vmean:  119.000 cm/s AV VTI:            0.339 m AV Peak Grad:      8.8 mmHg AV Mean Grad:      6.0 mmHg LVOT Vmax:         108.00 cm/s LVOT Vmean:        75.400 cm/s LVOT VTI:          0.217 m LVOT/AV VTI ratio: 0.64  AORTA Ao Root diam: 3.20 cm Ao Asc diam:  2.80 cm MITRAL VALVE               TRICUSPID VALVE MV Area (PHT): 5.66 cm    TR Peak grad:   33.2 mmHg MV Area VTI:   2.53 cm    TR Vmax:        288.00 cm/s MV Peak grad:  8.0 mmHg MV Mean grad:  2.0 mmHg    SHUNTS MV Vmax:       1.41 m/s    Systemic VTI:  0.22 m MV Vmean:      60.2 cm/s   Systemic Diam: 2.00 cm MV Decel Time: 134 msec MV E velocity: 1.45 cm/s MV A velocity: 65.30 cm/s MV E/A ratio:  0.02 Fransico Him MD  Electronically signed by Fransico Him MD Signature Date/Time: 11/26/2020/12:32:16 PM    Final      Scheduled Meds:  calcium acetate  1,334 mg Oral TID WC   carvedilol  12.5 mg Oral BID   Chlorhexidine Gluconate Cloth  6 each Topical Daily   darbepoetin (ARANESP) injection - NON-DIALYSIS  200 mcg Subcutaneous Q Mon-1800   hydrALAZINE  50 mg Oral TID   insulin aspart  0-15 Units Subcutaneous TID WC   insulin aspart  0-5 Units Subcutaneous QHS   insulin glargine  7 Units Subcutaneous QHS   isosorbide mononitrate  30 mg Oral Daily   levETIRAcetam  500 mg Oral BID   metolazone  10 mg Oral Daily   sodium bicarbonate  650 mg Oral TID   sodium zirconium cyclosilicate  10 g Oral BID   tamsulosin  0.4 mg Oral QPC supper   Continuous Infusions:  ferric gluconate (FERRLECIT) IVPB     furosemide 160 mg (11/28/20 1117)     LOS: 3 days    Time spent: 35 minutes.    Barton Dubois, MD [facility]   To contact the attending provider between 7A-7P or the covering provider during after hours 7P-7A, please log into the web site [email or web address] and access using universal [place] password for that web site. If you do not have the password, please call the hospital operator.  11/28/2020, 11:42 AM

## 2020-11-28 NOTE — Progress Notes (Signed)
Subjective:  Have been able to achieve negative fluid balance over weekend-  5 liters -  BUN and crt not significantly different -  bicarb up a little  Objective Vital signs in last 24 hours: Vitals:   11/27/20 0804 11/27/20 1352 11/27/20 2130 11/28/20 0534  BP: 121/69 122/75 (!) 143/84 138/76  Pulse: 74 76 80 78  Resp:   18 18  Temp:  98.1 F (36.7 C) 98.3 F (36.8 C) 98.7 F (37.1 C)  TempSrc:  Oral Oral Oral  SpO2: 98% 96% 97% 98%  Weight:    (!) 172.9 kg  Height:       Weight change: -1.9 kg  Intake/Output Summary (Last 24 hours) at 11/28/2020 0831 Last data filed at 11/28/2020 0547 Gross per 24 hour  Intake 922.58 ml  Output 2850 ml  Net -1927.42 ml    Assessment/Plan: 35 year old WF with advanced CKD in the setting of poorlly controlled DM and HTN, poor social situation not allowing appropriate follow up-  now with massive volume overload-  anemia , metabolic acidosis and hyperkalemia 1.Renal- advanced CKD-  progressive for a while.  I suspect we are at the place where dialysis is needed.  Pt is accepting of this fact.   will contact vascular surgery for access plan.  In the meantime there is no urgent urgent need for HD-  will cont with max medical therapy of IV lasix max dose-  add metolazone-  is making some urine-  also cont bicarb and lokelma-   increase doses of both and follow labs 2. Hypertension/volume  - hypertensive mostly due to volume, have been able to achieve diuresis-  have stopped norvasc-  max medical diuresis-  BP is not bad-  stop clonidine and decrease hydralazine as well  3. Anemia  - likely due to CKD-  iron stores low , will replete and treat esa as well 4. Bones-  check PTH- pending and  act as needed.  Her corrected calcium is on the 8's-  there is an elvated phos- have added phoslo 5. Dispo-  I cant help but wonder if she might benefit from a SNF placement for rehab or some OP rehab so that she can be more mobile if that is even possible   Louis Meckel    Labs: Basic Metabolic Panel: Recent Labs  Lab 11/26/20 0806 11/27/20 0559 11/28/20 0549  NA 137 136 135  K 5.7* 5.5* 5.6*  CL 114* 112* 110  CO2 15* 16* 18*  GLUCOSE 104* 99 96  BUN 68* 71* 75*  CREATININE 6.54* 6.36* 6.76*  CALCIUM 6.2* 6.4* 6.4*  PHOS 7.6* 7.6* 7.7*   Liver Function Tests: Recent Labs  Lab 11/25/20 1801 11/26/20 0806 11/27/20 0559 11/28/20 0549  AST 15 10*  --   --   ALT 9 10  --   --   ALKPHOS 189* 171*  --   --   BILITOT 0.6 0.7  --   --   PROT 6.1* 5.7*  --   --   ALBUMIN 2.3* 2.1* 2.0* 2.0*   No results for input(s): LIPASE, AMYLASE in the last 168 hours. No results for input(s): AMMONIA in the last 168 hours. CBC: Recent Labs  Lab 11/25/20 1801 11/26/20 0806  WBC 9.6 7.6  HGB 7.6* 7.3*  HCT 24.9* 24.3*  MCV 95.8 96.4  PLT 204 179   Cardiac Enzymes: No results for input(s): CKTOTAL, CKMB, CKMBINDEX, TROPONINI in the last 168 hours. CBG: Recent  Labs  Lab 11/27/20 0727 11/27/20 1109 11/27/20 1606 11/27/20 2140 11/28/20 0742  GLUCAP 100* 103* 107* 127* 89    Iron Studies:  Recent Labs    11/27/20 0559  IRON 28  TIBC 173*  FERRITIN 164   Studies/Results: Portable chest 1 View  Result Date: 11/26/2020 CLINICAL DATA:  Pleural effusion. EXAM: PORTABLE CHEST 1 VIEW COMPARISON:  November 25, 2020. FINDINGS: Stable cardiomediastinal silhouette. Mild central pulmonary vascular congestion may be present. Right basilar opacity is noted concerning for possible atelectasis and effusion. Bony thorax is unremarkable. IMPRESSION: Mild central pulmonary vascular congestion. Right basilar opacity is noted concerning for possible atelectasis and pleural effusion. Electronically Signed   By: Marijo Conception M.D.   On: 11/26/2020 10:10   ECHOCARDIOGRAM COMPLETE  Result Date: 11/26/2020    ECHOCARDIOGRAM REPORT   Patient Name:   Sheila Austin Date of Exam: 11/26/2020 Medical Rec #:  [id number]        Height:       62.0 in  Accession #:    [id number]       Weight:       382.7 lb Date of Birth:  [birth year]        BSA:          2.519 m Patient Age:    18 years         BP:           181/96 mmHg Patient Gender: F                HR:           80 bpm. Exam Location:  [facility] Procedure: 2D Echo, Cardiac Doppler and Color Doppler Indications:    CHF  History:        Patient has prior history of Echocardiogram examinations, most                 recent 09/24/2020. CHF; Risk Factors:Hypertension, Diabetes and                 Dyslipidemia.  Sonographer:    Wenda Low Referring Phys: [id number] [place]  1. Left ventricular ejection fraction, by estimation, is 60 to 65%. The left ventricle has normal function. The left ventricle has no regional wall motion abnormalities. There is mild concentric left ventricular hypertrophy. Left ventricular diastolic parameters are indeterminate.  2. Prominent moderator band present. Right ventricular systolic function is severely reduced. The right ventricular size is severely enlarged. There is mildly elevated pulmonary artery systolic pressure.  3. A small pericardial effusion is present. The pericardial effusion is circumferential.  4. The mitral valve is normal in structure. Trivial mitral valve regurgitation. No evidence of mitral stenosis.  5. Tricuspid valve regurgitation is mild to moderate.  6. The aortic valve is normal in structure. Aortic valve regurgitation is not visualized. No aortic stenosis is present.  7. The inferior vena cava is normal in size with greater than 50% respiratory variability, suggesting right atrial pressure of 3 mmHg. FINDINGS  Left Ventricle: Left ventricular ejection fraction, by estimation, is 60 to 65%. The left ventricle has normal function. The left ventricle has no regional wall motion abnormalities. The left ventricular internal cavity size was normal in size. There is  mild concentric left ventricular hypertrophy. Left ventricular diastolic  parameters are indeterminate. Normal left ventricular filling pressure. Right Ventricle: Prominent moderator band present. The right ventricular size is severely enlarged. No increase in right ventricular wall thickness. Right  ventricular systolic function is severely reduced. There is mildly elevated pulmonary artery systolic pressure. The tricuspid regurgitant velocity is 2.88 m/s, and with an assumed right atrial pressure of 8 mmHg, the estimated right ventricular systolic pressure is [id number] mmHg. Left Atrium: Left atrial size was normal in size. Right Atrium: Right atrial size was normal in size. Pericardium: A small pericardial effusion is present. The pericardial effusion is circumferential. Mitral Valve: The mitral valve is normal in structure. There is mild thickening of the mitral valve leaflet(s). Trivial mitral valve regurgitation. No evidence of mitral valve stenosis. MV peak gradient, 8.0 mmHg. The mean mitral valve gradient is 2.0 mmHg. Tricuspid Valve: The tricuspid valve is normal in structure. Tricuspid valve regurgitation is mild to moderate. No evidence of tricuspid stenosis. Aortic Valve: The aortic valve is normal in structure. Aortic valve regurgitation is not visualized. No aortic stenosis is present. Aortic valve mean gradient measures 6.0 mmHg. Aortic valve peak gradient measures 8.8 mmHg. Aortic valve area, by VTI measures 2.01 cm. Pulmonic Valve: The pulmonic valve was normal in structure. Pulmonic valve regurgitation is trivial. No evidence of pulmonic stenosis. Aorta: The aortic root is normal in size and structure. Venous: The inferior vena cava is normal in size with greater than 50% respiratory variability, suggesting right atrial pressure of 3 mmHg. IAS/Shunts: No atrial level shunt detected by color flow Doppler.  LEFT VENTRICLE PLAX 2D LVIDd:         4.71 cm  Diastology LVIDs:         3.45 cm  LV e' medial:    0.13 cm/s LV PW:         1.26 cm  LV E/e' medial:  11.1 LV IVS:         1.24 cm  LV e' lateral:   0.10 cm/s LVOT diam:     2.00 cm  LV E/e' lateral: 14.5 LV SV:         68 LV SV Index:   27 LVOT Area:     3.14 cm  RIGHT VENTRICLE RV Basal diam:  4.05 cm RV Mid diam:    4.65 cm LEFT ATRIUM             Index       RIGHT ATRIUM           Index LA diam:        4.50 cm 1.79 cm/m  RA Area:     18.50 cm LA Vol (A2C):   37.9 ml 15.05 ml/m RA Volume:   49.20 ml  19.53 ml/m LA Vol (A4C):   52.5 ml 20.84 ml/m LA Biplane Vol: 46.5 ml 18.46 ml/m  AORTIC VALVE AV Area (Vmax):    2.29 cm AV Area (Vmean):   1.99 cm AV Area (VTI):     2.01 cm AV Vmax:           148.00 cm/s AV Vmean:          119.000 cm/s AV VTI:            0.339 m AV Peak Grad:      8.8 mmHg AV Mean Grad:      6.0 mmHg LVOT Vmax:         108.00 cm/s LVOT Vmean:        75.400 cm/s LVOT VTI:          0.217 m LVOT/AV VTI ratio: 0.64  AORTA Ao Root diam: 3.20 cm Ao Asc diam:  2.80 cm MITRAL VALVE  TRICUSPID VALVE MV Area (PHT): 5.66 cm    TR Peak grad:   33.2 mmHg MV Area VTI:   2.53 cm    TR Vmax:        288.00 cm/s MV Peak grad:  8.0 mmHg MV Mean grad:  2.0 mmHg    SHUNTS MV Vmax:       1.41 m/s    Systemic VTI:  0.22 m MV Vmean:      60.2 cm/s   Systemic Diam: 2.00 cm MV Decel Time: 134 msec MV E velocity: 1.45 cm/s MV A velocity: 65.30 cm/s MV E/A ratio:  0.02 Fransico Him MD Electronically signed by Fransico Him MD Signature Date/Time: 11/26/2020/12:32:16 PM    Final    Medications: Infusions:  furosemide 160 mg (11/28/20 0158)    Scheduled Medications:  calcium acetate  1,334 mg Oral TID WC   carvedilol  12.5 mg Oral BID   Chlorhexidine Gluconate Cloth  6 each Topical Daily   cloNIDine  0.1 mg Transdermal Q Sun   hydrALAZINE  100 mg Oral TID   insulin aspart  0-15 Units Subcutaneous TID WC   insulin aspart  0-5 Units Subcutaneous QHS   insulin glargine  7 Units Subcutaneous QHS   isosorbide mononitrate  30 mg Oral Daily   levETIRAcetam  500 mg Oral BID   metolazone  10 mg Oral Daily   sodium  bicarbonate  650 mg Oral BID   sodium zirconium cyclosilicate  5 g Oral Daily   tamsulosin  0.4 mg Oral QPC supper    have reviewed scheduled and prn medications.  Physical Exam: General:  alert, pleasant- NAD Heart: RRR Lungs: mostly clear Abdomen: obese, soft non tender Extremities: pitting but also chronic woody edema present Dialysis Access: none yet     11/28/2020,8:31 AM  LOS: 3 days

## 2020-11-29 ENCOUNTER — Inpatient Hospital Stay (HOSPITAL_COMMUNITY): Payer: Medicaid Other | Admitting: Certified Registered Nurse Anesthetist

## 2020-11-29 ENCOUNTER — Encounter (HOSPITAL_COMMUNITY): Payer: Self-pay | Admitting: Family Medicine

## 2020-11-29 ENCOUNTER — Inpatient Hospital Stay (HOSPITAL_COMMUNITY): Payer: Medicaid Other

## 2020-11-29 ENCOUNTER — Encounter (HOSPITAL_COMMUNITY)
Admission: EM | Disposition: A | Discharge status: Skilled Nursing Facility | Payer: Self-pay | Source: Home / Self Care | Attending: Internal Medicine

## 2020-11-29 DIAGNOSIS — N185 Chronic kidney disease, stage 5: Secondary | ICD-10-CM | POA: Diagnosis not present

## 2020-11-29 DIAGNOSIS — N049 Nephrotic syndrome with unspecified morphologic changes: Secondary | ICD-10-CM | POA: Diagnosis not present

## 2020-11-29 DIAGNOSIS — N179 Acute kidney failure, unspecified: Secondary | ICD-10-CM | POA: Diagnosis not present

## 2020-11-29 DIAGNOSIS — N186 End stage renal disease: Secondary | ICD-10-CM

## 2020-11-29 DIAGNOSIS — Z992 Dependence on renal dialysis: Secondary | ICD-10-CM

## 2020-11-29 DIAGNOSIS — I10 Essential (primary) hypertension: Secondary | ICD-10-CM | POA: Diagnosis not present

## 2020-11-29 HISTORY — PX: INSERTION OF DIALYSIS CATHETER: SHX1324

## 2020-11-29 LAB — GLUCOSE, CAPILLARY
Glucose-Capillary: 91 mg/dL (ref 70–99)
Glucose-Capillary: 84 mg/dL (ref 70–99)
Glucose-Capillary: 92 mg/dL (ref 70–99)
Glucose-Capillary: 85 mg/dL (ref 70–99)
Glucose-Capillary: 93 mg/dL (ref 70–99)
Glucose-Capillary: 100 mg/dL — ABNORMAL HIGH (ref 70–99)

## 2020-11-29 LAB — RENAL FUNCTION PANEL
Albumin: 2.1 g/dL — ABNORMAL LOW (ref 3.5–5.0)
Anion gap: 10 (ref 5–15)
BUN: 76 mg/dL — ABNORMAL HIGH (ref 6–20)
CO2: 15 mmol/L — ABNORMAL LOW (ref 22–32)
Calcium: 6.5 mg/dL — ABNORMAL LOW (ref 8.9–10.3)
Chloride: 110 mmol/L (ref 98–111)
Creatinine, Ser: 6.86 mg/dL — ABNORMAL HIGH (ref 0.44–1.00)
GFR, Estimated: 8 mL/min — ABNORMAL LOW (ref >60)
Glucose, Bld: 97 mg/dL (ref 70–99)
Phosphorus: 7.9 mg/dL — ABNORMAL HIGH (ref 2.5–4.6)
Potassium: 5.2 mmol/L — ABNORMAL HIGH (ref 3.5–5.1)
Sodium: 135 mmol/L (ref 135–145)

## 2020-11-29 LAB — PARATHYROID HORMONE, INTACT (NO CA): PTH: 114 pg/mL — ABNORMAL HIGH (ref 15–65)

## 2020-11-29 LAB — SURGICAL PCR SCREEN
MRSA, PCR: POSITIVE — AB
Staphylococcus aureus: POSITIVE — AB

## 2020-11-29 SURGERY — INSERTION OF DIALYSIS CATHETER
Anesthesia: Monitor Anesthesia Care

## 2020-11-29 MED ORDER — MUPIROCIN 2 % EX OINT
1.0000 "application " | TOPICAL_OINTMENT | Freq: Two times a day (BID) | CUTANEOUS | Status: AC
  Administered 2020-11-29 – 2020-12-03 (×10): 1 via NASAL
  Filled 2020-11-29 (×2): qty 22

## 2020-11-29 MED ORDER — DEXMEDETOMIDINE (PRECEDEX) IN NS 20 MCG/5ML (4 MCG/ML) IV SYRINGE
PREFILLED_SYRINGE | INTRAVENOUS | Status: DC | PRN
  Administered 2020-11-29: 8 ug via INTRAVENOUS
  Administered 2020-11-29: 4 ug via INTRAVENOUS
  Administered 2020-11-29: 8 ug via INTRAVENOUS

## 2020-11-29 MED ORDER — PROPOFOL 10 MG/ML IV BOLUS
INTRAVENOUS | Status: AC
  Filled 2020-11-29: qty 40

## 2020-11-29 MED ORDER — FENTANYL CITRATE (PF) 100 MCG/2ML IJ SOLN
INTRAMUSCULAR | Status: AC
  Filled 2020-11-29: qty 2

## 2020-11-29 MED ORDER — HEPARIN SODIUM (PORCINE) 1000 UNIT/ML IJ SOLN
INTRAMUSCULAR | Status: DC | PRN
  Administered 2020-11-29: 6000 [IU] via INTRAVENOUS
  Administered 2020-11-29: 4000 [IU] via INTRAVENOUS

## 2020-11-29 MED ORDER — 0.9 % SODIUM CHLORIDE (POUR BTL) OPTIME
TOPICAL | Status: DC | PRN
  Administered 2020-11-29: 1000 mL

## 2020-11-29 MED ORDER — VANCOMYCIN HCL IN DEXTROSE 1-5 GM/200ML-% IV SOLN
INTRAVENOUS | Status: AC
  Filled 2020-11-29: qty 400

## 2020-11-29 MED ORDER — DEXMEDETOMIDINE (PRECEDEX) IN NS 20 MCG/5ML (4 MCG/ML) IV SYRINGE
PREFILLED_SYRINGE | INTRAVENOUS | Status: AC
  Filled 2020-11-29: qty 5

## 2020-11-29 MED ORDER — VANCOMYCIN HCL 2000 MG/400ML IV SOLN
2000.0000 mg | Freq: Once | INTRAVENOUS | Status: DC
  Filled 2020-11-29: qty 400

## 2020-11-29 MED ORDER — ORAL CARE MOUTH RINSE: 15.0000 mL | Freq: Once | OROMUCOSAL | Status: AC

## 2020-11-29 MED ORDER — CHLORHEXIDINE GLUCONATE 0.12 % MT SOLN
15.0000 mL | Freq: Once | OROMUCOSAL | Status: AC
  Administered 2020-11-29: 15 mL via OROMUCOSAL

## 2020-11-29 MED ORDER — MIDAZOLAM HCL 2 MG/2ML IJ SOLN
INTRAMUSCULAR | Status: DC | PRN
  Administered 2020-11-29: 2 mg via INTRAVENOUS

## 2020-11-29 MED ORDER — VANCOMYCIN HCL 1000 MG IV SOLR
INTRAVENOUS | Status: DC | PRN
  Administered 2020-11-29: 2000 mg via INTRAVENOUS

## 2020-11-29 MED ORDER — CHLORHEXIDINE GLUCONATE CLOTH 2 % EX PADS
6.0000 | MEDICATED_PAD | Freq: Every day | CUTANEOUS | Status: AC
  Administered 2020-11-29 – 2020-12-02 (×4): 6 via TOPICAL

## 2020-11-29 MED ORDER — HEPARIN SODIUM (PORCINE) 1000 UNIT/ML IJ SOLN
INTRAMUSCULAR | Status: AC
  Filled 2020-11-29: qty 1

## 2020-11-29 MED ORDER — MIDAZOLAM HCL 2 MG/2ML IJ SOLN
INTRAMUSCULAR | Status: AC
  Filled 2020-11-29: qty 2

## 2020-11-29 MED ORDER — LIDOCAINE-EPINEPHRINE 0.5 %-1:200000 IJ SOLN
INTRAMUSCULAR | Status: DC | PRN
  Administered 2020-11-29: 8 mL

## 2020-11-29 MED ORDER — LACTATED RINGERS IV SOLN: INTRAVENOUS | Status: DC

## 2020-11-29 MED ORDER — SODIUM CHLORIDE 0.9 % IV SOLN: INTRAVENOUS | Status: DC | PRN

## 2020-11-29 MED ORDER — LIDOCAINE-EPINEPHRINE 0.5 %-1:200000 IJ SOLN
INTRAMUSCULAR | Status: AC
  Filled 2020-11-29: qty 1

## 2020-11-29 MED ORDER — FENTANYL CITRATE (PF) 100 MCG/2ML IJ SOLN
INTRAMUSCULAR | Status: DC | PRN
  Administered 2020-11-29 (×2): 25 ug via INTRAVENOUS

## 2020-11-29 MED ORDER — FENTANYL CITRATE (PF) 100 MCG/2ML IJ SOLN: 25.0000 ug | INTRAMUSCULAR | Status: DC | PRN

## 2020-11-29 SURGICAL SUPPLY — 43 items
ADH SKN CLS APL DERMABOND .7 (GAUZE/BANDAGES/DRESSINGS) ×1
BAG DECANTER FOR FLEXI CONT (MISCELLANEOUS) ×2 IMPLANT
BAG HAMPER (MISCELLANEOUS) ×2 IMPLANT
BIOPATCH RED 1 DISK 7.0 (GAUZE/BANDAGES/DRESSINGS) ×2 IMPLANT
CATH PALINDROME-P 19CM W/VT (CATHETERS) IMPLANT
CATH PALINDROME-P 23CM W/VT (CATHETERS) ×1 IMPLANT
CATH PALINDROME-P 28CM W/VT (CATHETERS) IMPLANT
COVER LIGHT HANDLE STERIS (MISCELLANEOUS) ×4 IMPLANT
COVER PROBE U/S 5X48 (MISCELLANEOUS) ×2 IMPLANT
COVER SURGICAL LIGHT HANDLE (MISCELLANEOUS) ×2 IMPLANT
DECANTER SPIKE VIAL GLASS SM (MISCELLANEOUS) ×2 IMPLANT
DERMABOND ADVANCED (GAUZE/BANDAGES/DRESSINGS) ×1
DERMABOND ADVANCED .7 DNX12 (GAUZE/BANDAGES/DRESSINGS) ×1 IMPLANT
DRAPE C-ARM FOLDED MOBILE STRL (DRAPES) ×2 IMPLANT
DRAPE CHEST BREAST 15X10 FENES (DRAPES) ×2 IMPLANT
GAUZE 4X4 16PLY ~~LOC~~+RFID DBL (SPONGE) ×2 IMPLANT
GAUZE SPONGE 4X4 12PLY STRL (GAUZE/BANDAGES/DRESSINGS) ×2 IMPLANT
GEL ULTRASOUND 20GR AQUASONIC (MISCELLANEOUS) ×2 IMPLANT
GLOVE SS BIOGEL STRL SZ 7.5 (GLOVE) ×1 IMPLANT
GLOVE SUPERSENSE BIOGEL SZ 7.5 (GLOVE) ×1
GLOVE SURG UNDER POLY LF SZ7 (GLOVE) ×6 IMPLANT
GOWN STRL REUS W/TWL LRG LVL3 (GOWN DISPOSABLE) ×8 IMPLANT
IV NS 500ML (IV SOLUTION) ×2
IV NS 500ML BAXH (IV SOLUTION) ×1 IMPLANT
KIT PALINDROME-P 55CM (CATHETERS) IMPLANT
KIT TURNOVER KIT A (KITS) ×2 IMPLANT
MANIFOLD NEPTUNE II (INSTRUMENTS) ×2 IMPLANT
NDL 18GX1X1/2 (RX/OR ONLY) (NEEDLE) ×1 IMPLANT
NDL HYPO 25GX1X1/2 BEV (NEEDLE) ×1 IMPLANT
NEEDLE 18GX1X1/2 (RX/OR ONLY) (NEEDLE) ×2 IMPLANT
NEEDLE 22X1 1/2 (OR ONLY) (NEEDLE) ×1 IMPLANT
NEEDLE HYPO 25GX1X1/2 BEV (NEEDLE) ×2 IMPLANT
PACK SURGICAL SETUP 50X90 (CUSTOM PROCEDURE TRAY) ×2 IMPLANT
PAD ARMBOARD 7.5X6 YLW CONV (MISCELLANEOUS) ×4 IMPLANT
SET BASIN LINEN APH (SET/KITS/TRAYS/PACK) ×2 IMPLANT
SOAP 2 % CHG 4 OZ (WOUND CARE) ×2 IMPLANT
SUT ETHILON 3 0 PS 1 (SUTURE) ×2 IMPLANT
SUT VICRYL 4-0 PS2 18IN ABS (SUTURE) ×2 IMPLANT
SYR 10ML LL (SYRINGE) ×2 IMPLANT
SYR 5ML LL (SYRINGE) ×4 IMPLANT
SYR CONTROL 10ML LL (SYRINGE) ×2 IMPLANT
TOWEL GREEN STERILE (TOWEL DISPOSABLE) ×2 IMPLANT
WATER STERILE IRR 1000ML POUR (IV SOLUTION) ×2 IMPLANT

## 2020-11-29 NOTE — Anesthesia Preprocedure Evaluation (Signed)
Anesthesia Evaluation  Patient identified by MRN, date of birth, ID band Patient awake    Reviewed: Allergy & Precautions, H&P , NPO status , Patient's Chart, lab work & pertinent test results, reviewed documented beta blocker date and time   Airway Mallampati: II  TM Distance: >3 FB Neck ROM: full    Dental no notable dental hx. (+) Teeth Intact   Pulmonary sleep apnea ,    Pulmonary exam normal breath sounds clear to auscultation       Cardiovascular Exercise Tolerance: Good hypertension, +CHF   Rhythm:regular Rate:Normal     Neuro/Psych  Headaches, Seizures -,  PSYCHIATRIC DISORDERS  Neuromuscular disease    GI/Hepatic Neg liver ROS, GERD  Medicated,  Endo/Other  diabetesMorbid obesity  Renal/GU ESRFRenal disease  negative genitourinary   Musculoskeletal   Abdominal   Peds  Hematology  (+) Blood dyscrasia, anemia ,   Anesthesia Other Findings   Reproductive/Obstetrics negative OB ROS                             Anesthesia Physical Anesthesia Plan  ASA: 4  Anesthesia Plan: General   Post-op Pain Management:    Induction:   PONV Risk Score and Plan: Ondansetron  Airway Management Planned:   Additional Equipment:   Intra-op Plan:   Post-operative Plan:   Informed Consent: I have reviewed the patients History and Physical, chart, labs and discussed the procedure including the risks, benefits and alternatives for the proposed anesthesia with the patient or authorized representative who has indicated his/her understanding and acceptance.     Dental Advisory Given  Plan Discussed with: CRNA  Anesthesia Plan Comments:         Anesthesia Quick Evaluation

## 2020-11-29 NOTE — Progress Notes (Signed)
Subjective:  Negative another 1800 last 24 hours-  appreciate VVS assist for HD access Objective Vital signs in last 24 hours: Vitals:   11/28/20 1242 11/28/20 2033 11/29/20 0436 11/29/20 0600  BP: (!) 143/82 133/76 (!) 138/93   Pulse: 76 75 73   Resp: '17 18 18   '$ Temp: 97.9 F (36.6 C) 98 F (36.7 C) 98.2 F (36.8 C)   TempSrc: Oral Oral Oral   SpO2: 98% 100% 100%   Weight:    (!) 172.9 kg  Height:    '5\' 2"'$  (1.575 m)   Weight change: 0 kg  Intake/Output Summary (Last 24 hours) at 11/29/2020 [id number] Last data filed at 11/29/2020 0500 Gross per 24 hour  Intake 1411.65 ml  Output 3250 ml  Net -1838.35 ml    Assessment/Plan: 35 year old WF with advanced CKD in the setting of poorly controlled DM and HTN, poor social situation not allowing appropriate follow up-  now with massive volume overload-  anemia , metabolic acidosis and hyperkalemia 1.Renal- advanced CKD-  progressive for a while.  I suspect we are at the place where dialysis is needed.  Pt is accepting of this fact.   Have contacted vascular surgery for access plan-  trying to get [facility] and access at the same time.  In the meantime there is no urgent urgent need for HD-  will cont with max medical therapy of IV lasix max dose-  added metolazone-  is making urine, able to achieve diuresis-  also cont bicarb and lokelma-   increased doses of both and follow labs.  Hoping to get her started on dialysis by end of week, as soon as access can be done-  transport to OP dialysis might be an issue given her mobility issues but can get SW started on the arrangements 2. Hypertension/volume  - hypertensive mostly due to volume, have been able to achieve diuresis-  have stopped norvasc-  max medical diuresis-  BP is not bad-  stopped clonidine and decreased hydralazine as well  3. Anemia  - likely due to CKD-  iron stores low , will replete and treat esa  4. Bones-  check PTH- pending and  act as needed.  Her corrected calcium is on the 8's-  there  is an elevated phos- have added phoslo 5. Dispo-  I cant help but wonder if she might benefit from a SNF placement for rehab or some OP rehab so that she can be more mobile if that is even possible   Sheila Austin    Labs: Basic Metabolic Panel: Recent Labs  Lab 11/27/20 0559 11/28/20 0549 11/29/20 0432  NA 136 135 135  K 5.5* 5.6* 5.2*  CL 112* 110 110  CO2 16* 18* 15*  GLUCOSE 99 96 97  BUN 71* 75* 76*  CREATININE 6.36* 6.76* 6.86*  CALCIUM 6.4* 6.4* 6.5*  PHOS 7.6* 7.7* 7.9*   Liver Function Tests: Recent Labs  Lab 11/25/20 1801 11/26/20 0806 11/27/20 0559 11/28/20 0549 11/29/20 0432  AST 15 10*  --   --   --   ALT 9 10  --   --   --   ALKPHOS 189* 171*  --   --   --   BILITOT 0.6 0.7  --   --   --   PROT 6.1* 5.7*  --   --   --   ALBUMIN 2.3* 2.1* 2.0* 2.0* 2.1*   No results for input(s): LIPASE, AMYLASE in the last  168 hours. No results for input(s): AMMONIA in the last 168 hours. CBC: Recent Labs  Lab 11/25/20 1801 11/26/20 0806  WBC 9.6 7.6  HGB 7.6* 7.3*  HCT 24.9* 24.3*  MCV 95.8 96.4  PLT 204 179   Cardiac Enzymes: No results for input(s): CKTOTAL, CKMB, CKMBINDEX, TROPONINI in the last 168 hours. CBG: Recent Labs  Lab 11/27/20 2140 11/28/20 0742 11/28/20 1115 11/28/20 1608 11/28/20 2036  GLUCAP 127* 89 109* 124* 125*    Iron Studies:  Recent Labs    11/27/20 0559  IRON 28  TIBC 173*  FERRITIN 164   Studies/Results: No results found. Medications: Infusions:  ferric gluconate (FERRLECIT) IVPB 250 mg (11/28/20 1237)   furosemide 160 mg (11/29/20 0437)    Scheduled Medications:  calcium acetate  1,334 mg Oral TID WC   carvedilol  12.5 mg Oral BID   Chlorhexidine Gluconate Cloth  6 each Topical Q0600   darbepoetin (ARANESP) injection - NON-DIALYSIS  200 mcg Subcutaneous Q Mon-1800   hydrALAZINE  50 mg Oral TID   insulin aspart  0-15 Units Subcutaneous TID WC   insulin aspart  0-5 Units Subcutaneous QHS   insulin  glargine  7 Units Subcutaneous QHS   isosorbide mononitrate  30 mg Oral Daily   levETIRAcetam  500 mg Oral BID   metolazone  10 mg Oral Daily   mupirocin ointment  1 application Nasal BID   sodium bicarbonate  650 mg Oral TID   sodium zirconium cyclosilicate  10 g Oral BID   tamsulosin  0.4 mg Oral QPC supper    have reviewed scheduled and prn medications.  Physical Exam: General:  alert, pleasant- NAD Heart: RRR Lungs: mostly clear Abdomen: obese, soft non tender Extremities: pitting but also chronic woody edema present Dialysis Access: none yet     11/29/2020,7:23 AM  LOS: 4 days

## 2020-11-29 NOTE — Anesthesia Postprocedure Evaluation (Signed)
Anesthesia Post Note  Patient: Marleni L Silvestro  Procedure(s) Performed: INSERTION OF DIALYSIS CATHETER  Patient location during evaluation: Phase II Anesthesia Type: MAC Level of consciousness: awake Pain management: pain level controlled Vital Signs Assessment: post-procedure vital signs reviewed and stable Respiratory status: spontaneous breathing and respiratory function stable Cardiovascular status: blood pressure returned to baseline and stable Postop Assessment: no headache and no apparent nausea or vomiting Anesthetic complications: no Comments: Late entry   No notable events documented.   Last Vitals:  Vitals:   11/29/20 0436 11/29/20 1230  BP: (!) 138/93 (!) 144/84  Pulse: 73 79  Resp: 18 20  Temp: 36.8 C (!) 36.4 C  SpO2: 100% 96%    Last Pain:  Vitals:   11/29/20 1230  TempSrc: Oral  PainSc: 0-No pain                 Louann Sjogren

## 2020-11-29 NOTE — Transfer of Care (Signed)
Immediate Anesthesia Transfer of Care Note  Patient: Sheila Austin  Procedure(s) Performed: INSERTION OF DIALYSIS CATHETER  Patient Location: PACU  Anesthesia Type:MAC Level of Consciousness: awake, alert  and oriented  Airway & Oxygen Therapy: Patient Spontanous Breathing  Post-op Assessment: Report given to RN and Post -op Vital signs reviewed and stable  Post vital signs: Reviewed and stable  Last Vitals:  Vitals Value Taken Time  BP 146/82 11/29/20 1438  Temp    Pulse 77 11/29/20 1439  Resp 9 11/29/20 1439  SpO2 95 % 11/29/20 1439  Vitals shown include unvalidated device data.  Last Pain:  Vitals:   11/29/20 1230  TempSrc: Oral  PainSc: 0-No pain         Complications: No notable events documented.

## 2020-11-29 NOTE — Progress Notes (Signed)
PROGRESS NOTE    Sheila Austin  [id number] DOB: [birth year] DOA: 11/25/2020 PCP: Pcp, No    Chief Complaint  Patient presents with   Weakness    Brief admission Narrative:  As per H&P written by Dr. Clearence Ped on 11/26/20 35 y.o. female,with hx of CHF, DMII, HTN, GERD, morbid obesity, and chronic kidney disease presents to the ED with a chief complaint of peripheral edema.  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  She reports that she has a bariatric wheelchair at home, but she and her fianc live in a mobile home.  There are parts of the home, including the bathroom, where the bariatric wheelchair cannot access.  Patient usually crawls to the bathroom.  She reports that she has had increased swelling and peripheral edema, but she really started to notice it today, because she could not bend her knee to crawl on the floor.  She reports she can even get off the couch.  She also notes that she has had a decrease in urine output.  The symptoms have been associated with dyspnea on exertion, orthopnea, wheeze, and palpitations that last minutes.  Her symptoms are worse with exertion, better with rest.  She reports no cough.  She does have 2 L nasal cannula prescribed at home to wear 24/7.  She says she wears it at night and then she wears it when she needs it.  She has not been wearing it today.  Patient denies any fevers.  She has not had any dysuria or hematuria.  Patient is not on dialysis but her nephrologist in [place] has discussed this with her.  Dialysis discussion started in May 2022 and patient has been lost to follow-up since then as she does not have transport to get to her nephrologist in [place].  The discussion seem to be around peritoneal dialysis per patient's description.  Assessment & Plan: 1-Acute on chronic renal failure (ARF) (HCC) -stage 5 at baseline. -patient presented with massive fluid overload, hyperkalemia and  metabolic acidosis. -At this moment Case has been discussed with nephrology service and anticipation is for dialysis initiation eventually.  Patient is in agreement with pursuing dialysis if required. -For now will continue Maximizing IV Lasix and continued use of metolazone as dictated by nephrology service.   -vascular surgery consulted and planning [facility] and permanent acess placement later today  11/29/20. -Will follow nephrology service recommendations about dialysis initiation. -will continue sodium bicarbonate and Lokelma; K level 5.2. -patient is still making some marginal amount of urine. -Follow clinical response. -Continue to follow electrolytes trend.  2-hypertension -In the setting of fluid overload most likely -Continue max dose of IV diuretics -Continue to follow vital signs. -If needed will add hydralazine -Norvasc has been discontinued. -Low-sodium/renal diet discussed with patient.  3-type 2 diabetes with nephropathy -Continue sliding scale insulin and follow CBGs fluctuation. -Modify carbohydrate diet discussed with patient.  4-anemia of chronic kidney disease -Follow iron panel -IV iron therapy and Epogen as per nephrology discretion. -Last hemoglobin check 7.3; will repeat CBC and follow hemoglobin trend. -No overt bleeding appreciated.Marland Kitchen  5-secondary hyperparathyroidism/hypocalcemia -Calcium gluconate given on 11/26/2020; repeat calcium today 6.4 but corrected for her hypoalbuminemia 8.0. -Continue monitoring electrolytes trend and continue telemetry. -PhosLo has been added -Follow electrolytes trend.  6-super morbid obesity -Body mass index is 69.72 kg/m. -Low calorie diet, portion control and increase physical activity discussed with patient.  DVT prophylaxis: Heparin  Code Status: Full Code. Family Communication: no  family at bedside. Disposition:   Status is: Inpatient  Remains inpatient appropriate because:Ongoing diagnostic testing needed not  appropriate for outpatient work up  Dispo: The patient is from: Home              Anticipated d/c is to: To be determined              Patient currently is not medically stable to d/c.   Difficult to place patient No Yet.    Consultants:  Nephrology service Vascular surgery   Procedures:  See below for x-ray reports.  Antimicrobials:  None   Subjective: No chest pain, no nausea, no vomiting.  Experiencing shortness of breath, mild orthopnea and with significant fluid overload on exam.  Patient is a slightly anxious with anticipated dialysis catheter placement and access surgery.  Objective: Vitals:   11/29/20 0600 11/29/20 1230 11/29/20 1437 11/29/20 1621  BP:  (!) 144/84  (!) 145/83  Pulse:  79  73  Resp:  '20 15 18  '$ Temp:  (!) 97.5 F (36.4 C) 97.8 F (36.6 C)   TempSrc:  Oral    SpO2:  96% 95% 100%  Weight: (!) 172.9 kg     Height: '5\' 2"'$  (1.575 m)       Intake/Output Summary (Last 24 hours) at 11/29/2020 1625 Last data filed at 11/29/2020 1600 Gross per 24 hour  Intake 1171.65 ml  Output 4705 ml  Net -3533.35 ml   Filed Weights   11/27/20 0446 11/28/20 0534 11/29/20 0600  Weight: (!) 174.8 kg (!) 172.9 kg (!) 172.9 kg    Examination: General exam: Alert, awake, oriented x 3; morbidly obese in appearance; without signs of significant fluid overload status.  Reports no chest pain, no nausea, no vomiting. Respiratory system: Decreased breath sounds at the bases; distant auscultation secondary to body habitus.  No using accessory muscle. Cardiovascular system:RRR. No rubs or gallops; unable to assess JVD with body habitus. Gastrointestinal system: Abdomen is obese, soft and nontender. No organomegaly or masses felt. Normal bowel sounds heard. Central nervous system: Alert and oriented. No focal neurological deficits. Extremities: No cyanosis or clubbing; right BKA.  3+ edema appreciated bilaterally upper and lower extremities. Skin: No petechiae. Psychiatry:  Judgement and insight appear normal. Mood & affect appropriate.    Data Reviewed: I have personally reviewed following labs and imaging studies  CBC: Recent Labs  Lab 11/25/20 1801 11/26/20 0806  WBC 9.6 7.6  HGB 7.6* 7.3*  HCT 24.9* 24.3*  MCV 95.8 96.4  PLT 204 0000000    Basic Metabolic Panel: Recent Labs  Lab 11/25/20 1801 11/26/20 0806 11/27/20 0559 11/28/20 0549 11/29/20 0432  NA 135 137 136 135 135  K 5.8* 5.7* 5.5* 5.6* 5.2*  CL 115* 114* 112* 110 110  CO2 14* 15* 16* 18* 15*  GLUCOSE 150* 104* 99 96 97  BUN 70* 68* 71* 75* 76*  CREATININE 6.83* 6.54* 6.36* 6.76* 6.86*  CALCIUM 6.0* 6.2* 6.4* 6.4* 6.5*  MG 1.6* 1.5*  --   --   --   PHOS  --  7.6* 7.6* 7.7* 7.9*    GFR: Estimated Creatinine Clearance: 18.4 mL/min (A) (by C-G formula based on SCr of 6.86 mg/dL (H)).  Liver Function Tests: Recent Labs  Lab 11/25/20 1801 11/26/20 0806 11/27/20 0559 11/28/20 0549 11/29/20 0432  AST 15 10*  --   --   --   ALT 9 10  --   --   --   ALKPHOS 189*  171*  --   --   --   BILITOT 0.6 0.7  --   --   --   PROT 6.1* 5.7*  --   --   --   ALBUMIN 2.3* 2.1* 2.0* 2.0* 2.1*    CBG: Recent Labs  Lab 11/29/20 0804 11/29/20 1127 11/29/20 1224 11/29/20 1452 11/29/20 1621  GLUCAP 91 84 92 85 93     Recent Results (from the past 240 hour(s))  SARS CORONAVIRUS 2 (TAT 6-24 HRS) Nasopharyngeal Nasopharyngeal Swab     Status: None   Collection Time: 11/25/20  8:00 PM   Specimen: Nasopharyngeal Swab  Result Value Ref Range Status   SARS Coronavirus 2 NEGATIVE NEGATIVE Final    Comment: (NOTE) SARS-CoV-2 target nucleic acids are NOT DETECTED.  The SARS-CoV-2 RNA is generally detectable in upper and lower respiratory specimens during the acute phase of infection. Negative results do not preclude SARS-CoV-2 infection, do not rule out co-infections with other pathogens, and should not be used as the sole basis for treatment or other patient management  decisions. Negative results must be combined with clinical observations, patient history, and epidemiological information. The expected result is Negative.  Fact Sheet for Patients: SugarRoll.be  Fact Sheet for Healthcare Providers: https://www.woods-mathews.com/  This test is not yet approved or cleared by the [place] FDA and  has been authorized for detection and/or diagnosis of SARS-CoV-2 by FDA under an Emergency Use Authorization (EUA). This EUA will remain  in effect (meaning this test can be used) for the duration of the COVID-19 declaration under Se ction 564(b)(1) of the Act, 21 U.S.C. section 360bbb-3(b)(1), unless the authorization is terminated or revoked sooner.  Performed at [facility], [place] [address] [place], [place] [address]   Surgical pcr screen     Status: Abnormal   Collection Time: 11/28/20 10:11 PM   Specimen: Nasal Mucosa; Nasal Swab  Result Value Ref Range Status   MRSA, PCR POSITIVE (A) NEGATIVE Final    Comment: RESULT CALLED TO, READ BACK BY AND VERIFIED WITH: WATLINGTON,J'@0307'$  BY MATTHEWS, B 7.26.22    Staphylococcus aureus POSITIVE (A) NEGATIVE Final    Comment: RESULT CALLED TO, READ BACK BY AND VERIFIED WITH: WATLINGTON,J'@0307'$  BY MATTHEWS, B 7.26.22 (NOTE) The Xpert SA Assay (FDA approved for NASAL specimens in patients 28 years of age and older), is one component of a comprehensive surveillance program. It is not intended to diagnose infection nor to guide or monitor treatment. Performed at [facility], [address] [place], [place] [address]      Radiology Studies: [facility] Chest [facility] 1 View  Result Date: 11/29/2020 CLINICAL DATA:  Status post dialysis catheter placement. EXAM: PORTABLE CHEST 1 VIEW COMPARISON:  11/26/2020 FINDINGS: Stable cardiac enlargement. Interval placement of right jugular tunneled dialysis catheter with catheter tip overlying the right atrium. No pneumothorax.  Slight decrease in pulmonary vascular congestion. No pleural effusions. IMPRESSION: Dialysis catheter tip overlies the right atrium. No pneumothorax. Slight decrease in pulmonary vascular congestion. Electronically Signed   By: Aletta Edouard M.D.   On: 11/29/2020 16:17   DG C-Arm 1-60 Min-No Report  Result Date: 11/29/2020 Fluoroscopy was utilized by the requesting physician.  No radiographic interpretation.     Scheduled Meds:  [MAR Hold] calcium acetate  1,334 mg Oral TID WC   [MAR Hold] carvedilol  12.5 mg Oral BID   [MAR Hold] Chlorhexidine Gluconate Cloth  6 each Topical Q0600   [MAR Hold] darbepoetin (ARANESP) injection - NON-DIALYSIS  200 mcg Subcutaneous Q Mon-1800   [MAR Hold] hydrALAZINE  50 mg Oral TID   [MAR Hold] insulin aspart  0-15 Units Subcutaneous TID WC   [MAR Hold] insulin aspart  0-5 Units Subcutaneous QHS   [MAR Hold] insulin glargine  7 Units Subcutaneous QHS   [MAR Hold] isosorbide mononitrate  30 mg Oral Daily   [MAR Hold] levETIRAcetam  500 mg Oral BID   [MAR Hold] metolazone  10 mg Oral Daily   [MAR Hold] mupirocin ointment  1 application Nasal BID   [MAR Hold] sodium bicarbonate  650 mg Oral TID   [MAR Hold] sodium zirconium cyclosilicate  10 g Oral BID   [MAR Hold] tamsulosin  0.4 mg Oral QPC supper   Continuous Infusions:  [MAR Hold] ferric gluconate (FERRLECIT) IVPB 250 mg (11/29/20 1059)   [MAR Hold] furosemide Stopped (11/29/20 1058)     LOS: 4 days    Time spent: 35 minutes.    Barton Dubois, MD [facility]   To contact the attending provider between 7A-7P or the covering provider during after hours 7P-7A, please log into the web site [email or web address] and access using universal [place] password for that web site. If you do not have the password, please call the hospital operator.  11/29/2020, 4:25 PM

## 2020-11-29 NOTE — Op Note (Signed)
    OPERATIVE REPORT  DATE OF SURGERY: 11/29/2020  PATIENT: Sheila Austin, 35 y.o. female MRN: [id number]  DOB: 05/28/1985  PRE-OPERATIVE DIAGNOSIS: End-stage renal disease  POST-OPERATIVE DIAGNOSIS:  Same  PROCEDURE: Right IJ tunneled hemodialysis catheter with SonoSite visualization  SURGEON:  Curt Jews, M.D.  PHYSICIAN ASSISTANT: Nurse  The assistant was needed for exposure and to expedite the case  ANESTHESIA: Local with sedation  EBL: per anesthesia record  Total I/O In: 600 [I.V.:200; IV Piggyback:400] Out: [id number]; Blood:5]  BLOOD ADMINISTERED: none  DRAINS: none  SPECIMEN: none  COUNTS CORRECT:  YES  PATIENT DISPOSITION:  PACU - hemodynamically stable  PROCEDURE DETAILS: The patient was taken operating placed supine position where there the right and left neck and chest prepped draped in sterile fashion.  Using SonoSite ultrasound and local anesthesia the right internal jugular vein was accessed with an 18-gauge needle.  A guidewire was passed centrally and this was confirmed with fluoroscopy.  Dilator and peel-away sheath was passed over the guidewire and the dilator and wire removed.  The 23 cm catheter was passed down the peel-away sheath and the peel-away sheath was removed.  The catheter was brought through a subcutaneous tunnel.  The catheter tips were positioned the level of the distal right atrium.  The catheter was secured to the skin with a 3-0 nylon stitch and the entry site was closed with a 4 subcuticular Vicryl stitch.  The patient was transferred to the recovery room with chest x-ray pending   [name], M.D., [facility] 11/29/2020 3:43 PM  Note: Portions of this report may have been transcribed using voice recognition software.  Every effort has been made to ensure accuracy; however, inadvertent computerized transcription errors may still be present.

## 2020-11-29 NOTE — Consult Note (Signed)
Patient name: Sheila Austin MRN: [id number] DOB: 1985-07-14 Sex: female    HPI: Sheila Austin is a 35 y.o. female admitted with kidney failure.  She is seen in consultation for tunneled hemodialysis catheter acutely and eventual long-term access.  She is new to dialysis.  She does not have a pacemaker.  She is not on anticoagulation.  She has a long history of poorly controlled insulin-dependent diabetes.  She is known to our service from prior evaluation for not unreconstructable peripheral vascular disease.  Eventually underwent amputation with Dr. Sharol Given.  Current Facility-Administered Medications  Medication Dose Route Frequency Provider Last Rate Last Admin   [MAR Hold] acetaminophen (TYLENOL) tablet 650 mg  650 mg Oral Q6H PRN Zierle-Ghosh, Asia B, DO   650 mg at 11/26/20 2218   Or   [MAR Hold] acetaminophen (TYLENOL) suppository 650 mg  650 mg Rectal Q6H PRN Zierle-Ghosh, Asia B, DO       [MAR Hold] albuterol (VENTOLIN HFA) 108 (90 Base) MCG/ACT inhaler 2 puff  2 puff Inhalation Q6H PRN Zierle-Ghosh, Asia B, DO       [MAR Hold] calcium acetate (PHOSLO) capsule 1,334 mg  1,334 mg Oral TID WC Corliss Parish, MD   1,334 mg at 11/29/20 0927   [MAR Hold] carvedilol (COREG) tablet 12.5 mg  12.5 mg Oral BID Zierle-Ghosh, Asia B, DO   12.5 mg at 11/29/20 0925   [MAR Hold] Chlorhexidine Gluconate Cloth 2 % PADS 6 each  6 each Topical Q0600 Barton Dubois, MD   6 each at 11/29/20 0634   [MAR Hold] Darbepoetin Alfa (ARANESP) injection 200 mcg  200 mcg Subcutaneous Q Mon-1800 Corliss Parish, MD   200 mcg at 11/28/20 1731   [MAR Hold] ferric gluconate (FERRLECIT) 250 mg in sodium chloride 0.9 % 250 mL IVPB  250 mg Intravenous Daily Barton Dubois, MD 270 mL/hr at 11/29/20 1059 250 mg at 11/29/20 1059   [MAR Hold] furosemide (LASIX) 160 mg in dextrose 5 % 50 mL IVPB  160 mg Intravenous Q8H Zierle-Ghosh, Asia B, DO   Stopped at 11/29/20 1058   [MAR Hold] hydrALAZINE (APRESOLINE) tablet  50 mg  50 mg Oral TID Corliss Parish, MD   50 mg at 11/29/20 0925   [MAR Hold] insulin aspart (novoLOG) injection 0-15 Units  0-15 Units Subcutaneous TID WC Zierle-Ghosh, Asia B, DO   2 Units at 11/28/20 1753   [MAR Hold] insulin aspart (novoLOG) injection 0-5 Units  0-5 Units Subcutaneous QHS Zierle-Ghosh, Asia B, DO       [MAR Hold] insulin glargine (LANTUS) injection 7 Units  7 Units Subcutaneous QHS Zierle-Ghosh, Asia B, DO   7 Units at 11/28/20 2149   [facility] Hold] isosorbide mononitrate (IMDUR) 24 hr tablet 30 mg  30 mg Oral Daily Zierle-Ghosh, Asia B, DO   30 mg at 11/29/20 0925   [MAR Hold] levETIRAcetam (KEPPRA) tablet 500 mg  500 mg Oral BID Zierle-Ghosh, Asia B, DO   500 mg at 11/29/20 0926   [MAR Hold] metolazone (ZAROXOLYN) tablet 10 mg  10 mg Oral Daily Corliss Parish, MD   10 mg at 11/29/20 0926   [MAR Hold] mupirocin ointment (BACTROBAN) 2 % 1 application  1 application Nasal BID Barton Dubois, MD       [facility] Hold] ondansetron [facility]) tablet 4 mg  4 mg Oral Q6H PRN Zierle-Ghosh, Asia B, DO       Or   [MAR Hold] ondansetron (ZOFRAN) injection 4 mg  4 mg Intravenous Q6H  PRN Zierle-Ghosh, [place] B, DO       [MAR Hold] oxyCODONE (Oxy IR/ROXICODONE) immediate release tablet 5 mg  5 mg Oral Q4H PRN Zierle-Ghosh, Asia B, DO   5 mg at 11/28/20 0203   [MAR Hold] polyethylene glycol (MIRALAX / GLYCOLAX) packet 17 g  17 g Oral Daily PRN Zierle-Ghosh, Asia B, DO       [MAR Hold] sodium bicarbonate tablet 650 mg  650 mg Oral TID Corliss Parish, MD   650 mg at 11/29/20 0927   [MAR Hold] sodium zirconium cyclosilicate (LOKELMA) packet 10 g  10 g Oral BID Corliss Parish, MD   10 g at 11/28/20 2146   [facility] Hold] tamsulosin (FLOMAX) capsule 0.4 mg  0.4 mg Oral QPC supper Zierle-Ghosh, Asia B, DO   0.4 mg at 11/28/20 1730    REVIEW OF SYSTEMS:  '[X]'$  denotes positive finding, '[ ]'$  denotes negative finding Cardiac  Comments:  Chest pain or chest pressure:    Shortness of breath upon  exertion:    Short of breath when lying flat:    Irregular heart rhythm:    Constitutional    Fever or chills:      PHYSICAL EXAM: Vitals:   11/28/20 2033 11/29/20 0436 11/29/20 0600 11/29/20 1230  BP: 133/76 (!) 138/93  (!) 144/84  Pulse: 75 73  79  Resp: '18 18  20  '$ Temp: 98 F (36.7 C) 98.2 F (36.8 C)  (!) 97.5 F (36.4 C)  TempSrc: Oral Oral  Oral  SpO2: 100% 100%  96%  Weight:   (!) 172.9 kg   Height:   '5\' 2"'$  (1.575 m)     GENERAL: The patient is a well-nourished female, in no acute distress. The vital signs are documented above. CARDIOVASCULAR: There is a regular rate and rhythm. PULMONARY: There is good air exchange bilaterally without wheezing or rales. 2+ radial pulses bilaterally.  Small surface veins bilaterally  MEDICAL ISSUES:  I discussed the need for acute hemodialysis with the patient as discussed by Dr. Moshe Cipro.  We will place tunneled hemodialysis catheter today.  She is left-handed.  Will obtain vein mapping while inpatient and make plan for long-term hemodialysis electively  [facility] Kasia Trego [facility] [phone]

## 2020-11-30 ENCOUNTER — Encounter (HOSPITAL_COMMUNITY): Payer: Self-pay | Admitting: Vascular Surgery

## 2020-11-30 DIAGNOSIS — N186 End stage renal disease: Secondary | ICD-10-CM

## 2020-11-30 DIAGNOSIS — I10 Essential (primary) hypertension: Secondary | ICD-10-CM | POA: Diagnosis not present

## 2020-11-30 DIAGNOSIS — N185 Chronic kidney disease, stage 5: Secondary | ICD-10-CM | POA: Diagnosis not present

## 2020-11-30 DIAGNOSIS — D631 Anemia in chronic kidney disease: Secondary | ICD-10-CM | POA: Diagnosis not present

## 2020-11-30 DIAGNOSIS — E875 Hyperkalemia: Secondary | ICD-10-CM | POA: Diagnosis not present

## 2020-11-30 LAB — RENAL FUNCTION PANEL
Albumin: 2 g/dL — ABNORMAL LOW (ref 3.5–5.0)
Anion gap: 10 (ref 5–15)
BUN: 78 mg/dL — ABNORMAL HIGH (ref 6–20)
CO2: 17 mmol/L — ABNORMAL LOW (ref 22–32)
Calcium: 6.6 mg/dL — ABNORMAL LOW (ref 8.9–10.3)
Chloride: 108 mmol/L (ref 98–111)
Creatinine, Ser: 6.87 mg/dL — ABNORMAL HIGH (ref 0.44–1.00)
GFR, Estimated: 8 mL/min — ABNORMAL LOW (ref >60)
Glucose, Bld: 81 mg/dL (ref 70–99)
Phosphorus: 7.6 mg/dL — ABNORMAL HIGH (ref 2.5–4.6)
Potassium: 5 mmol/L (ref 3.5–5.1)
Sodium: 135 mmol/L (ref 135–145)

## 2020-11-30 LAB — GLUCOSE, CAPILLARY
Glucose-Capillary: 76 mg/dL (ref 70–99)
Glucose-Capillary: 121 mg/dL — ABNORMAL HIGH (ref 70–99)
Glucose-Capillary: 89 mg/dL (ref 70–99)
Glucose-Capillary: 114 mg/dL — ABNORMAL HIGH (ref 70–99)

## 2020-11-30 LAB — HEPATITIS B SURFACE ANTIGEN: Hepatitis B Surface Ag: NONREACTIVE

## 2020-11-30 LAB — HEPATITIS B CORE ANTIBODY, TOTAL: Hep B Core Total Ab: NONREACTIVE

## 2020-11-30 LAB — CBC
HCT: 22.8 % — ABNORMAL LOW (ref 36.0–46.0)
Hemoglobin: 7 g/dL — ABNORMAL LOW (ref 12.0–15.0)
MCH: 29.4 pg (ref 26.0–34.0)
MCHC: 30.7 g/dL (ref 30.0–36.0)
MCV: 95.8 fL (ref 80.0–100.0)
Platelets: 169 10*3/uL (ref 150–400)
RBC: 2.38 MIL/uL — ABNORMAL LOW (ref 3.87–5.11)
RDW: 14.8 % (ref 11.5–15.5)
WBC: 8.3 10*3/uL (ref 4.0–10.5)
nRBC: 0 % (ref 0.0–0.2)

## 2020-11-30 MED ORDER — HEPARIN SODIUM (PORCINE) 1000 UNIT/ML IJ SOLN: 3800.0000 [IU] | Freq: Once | INTRAMUSCULAR | Status: AC

## 2020-11-30 MED ORDER — ALTEPLASE 2 MG IJ SOLR: 2.0000 mg | Freq: Once | INTRAMUSCULAR | Status: DC | PRN

## 2020-11-30 MED ORDER — INSULIN ASPART 100 UNIT/ML IJ SOLN: 0.0000 [IU] | Freq: Every day | INTRAMUSCULAR | Status: DC

## 2020-11-30 MED ORDER — INSULIN ASPART 100 UNIT/ML IJ SOLN
0.0000 [IU] | Freq: Three times a day (TID) | INTRAMUSCULAR | Status: DC
  Administered 2020-12-02 – 2020-12-06 (×3): 1 [IU] via SUBCUTANEOUS

## 2020-11-30 MED ORDER — DIPHENHYDRAMINE HCL 25 MG PO CAPS
25.0000 mg | ORAL_CAPSULE | Freq: Once | ORAL | Status: AC
  Administered 2020-11-30: 25 mg via ORAL
  Filled 2020-11-30: qty 1

## 2020-11-30 MED ORDER — HEPARIN SODIUM (PORCINE) 1000 UNIT/ML IJ SOLN
INTRAMUSCULAR | Status: AC
  Administered 2020-11-30: 3800 [IU]
  Filled 2020-11-30: qty 4

## 2020-11-30 MED ORDER — INSULIN GLARGINE-YFGN 100 UNIT/ML ~~LOC~~ SOLN
4.0000 [IU] | Freq: Every day | SUBCUTANEOUS | Status: DC
  Administered 2020-11-30 – 2020-12-06 (×7): 4 [IU] via SUBCUTANEOUS
  Filled 2020-11-30 (×8): qty 0.04

## 2020-11-30 MED ORDER — SODIUM CHLORIDE 0.9 % IV SOLN: 100.0000 mL | INTRAVENOUS | Status: DC | PRN

## 2020-11-30 NOTE — Progress Notes (Signed)
Patient ID: Sheila Austin, female   DOB: Jul 17, 1985, 35 y.o.   MRN: [id number] Reviewed her chart.  Patient is not seen today.  Tunneled hemodialysis catheter placed without difficulty yesterday and ready for hemodialysis when indicated.  Vein mapping pending.  She is left-handed.  She will have very difficult time with long-term access due to her general condition and morbid obesity.  We will make recommendations pending vein mapping.  Can have arm long-term access as an outpatient.

## 2020-11-30 NOTE — NC FL2 (Signed)
[place] MEDICAID FL2 LEVEL OF CARE SCREENING TOOL     IDENTIFICATION  Patient Name: Sheila Austin Birthdate: [birth year] Sex: female Admission Date (Current Location): 11/25/2020  [facility] and [place] Number:  [employer] and Address:  [facility] [address] [place]      Provider Number: [id number]  Attending Physician Name and Address:  Orson Eva, MD  Relative Name and Phone Number:  Wonda Horner - Significant other  [phone]    Current Level of Care: Hospital Recommended Level of Care: [place] Prior Approval Number:    Date Approved/Denied:   PASRR Number: [id number]  Discharge Plan: SNF    Current Diagnoses: Patient Active Problem List   Diagnosis Date Noted   ESRD (end stage renal disease) ([place]) 11/30/2020   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 chronic kidney disease, not on chronic dialysis ([place])    Hypomagnesemia 11/26/2020   Cardiorenal syndrome 11/26/2020   Acute renal failure (ARF) ([place]) 11/25/2020   Acute exacerbation of CHF (congestive heart failure) ([place]) 10/20/2020   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/20/2020   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   SIRS (systemic inflammatory response syndrome) ([place]) 10/20/2020   Poor venous access    Morbid obesity with BMI of 60.0-69.9, adult ([place]) 09/27/2020   Leukocytosis 09/24/2020   Hyperphosphatemia 09/24/2020   Hypoalbuminemia due to protein-calorie malnutrition ([place]) 09/24/2020   Elevated lipase 09/24/2020   Elevated troponin 09/24/2020   Noncompliance with medication regimen 09/24/2020   Acute renal failure superimposed on chronic kidney disease ([place]) 09/24/2020   Hypertensive crisis 09/24/2020   Acute urinary retention 09/24/2020   Nausea & vomiting 10/10/2019   Hypokalemia 10/10/2019   CKD (chronic kidney disease), stage III ([place]) 10/10/2019   CKD (chronic kidney disease), stage III ([place]) 10/10/2019    Uncontrolled type 2 diabetes mellitus with hyperglycemia, without long-term current use of insulin ([place]) 08/23/2019   Intractable nausea and vomiting 08/23/2019   GERD with esophagitis 08/23/2019   Lactic acidosis 08/23/2019   Hyperosmolar hyperglycemic state (HHS) ([place]) 05/16/2019   AKI (acute kidney injury) ([place]) 05/16/2019   GERD (gastroesophageal reflux disease)    Hypertensive urgency    Benign essential HTN    Unilateral complete BKA, right, sequela (HCC)    Hyperkalemia    Unilateral complete BKA, right, initial encounter ([place]) 02/09/2019   Acquired absence of right leg below knee ([place])    Hyponatremia    Acute blood loss anemia    Sinus tachycardia    Bacteremia    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[place])    Streptococcal bacteremia    Klebsiella pneumoniae infection    Abscess of leg, right    Skin ulcer of right foot with necrosis of muscle (HCC)    Severe protein-calorie malnutrition (HCC)    DM (diabetes mellitus) ([place]) 02/01/2019   Adjustment disorder 02/01/2019   Sepsis due to cellulitis ([place]) 02/01/2019   Cellulitis in diabetic foot ([place]) 12/31/2018   Obesity 12/31/2018   Facial cellulitis 07/02/2018   Class 3 severe obesity with body mass index (BMI) of 40.0 to 44.9 in adult ([place]) 07/09/2017   Abscess of right breast 02/24/2016   BMI 45.0-49.9, adult ([place]) 09/29/2015   Morbid obesity with BMI of 50.0-59.9, adult ([place]) 10/05/2011   Pulmonary edema 10/05/2011   Obstructive sleep apnea 10/05/2011   HLD (hyperlipidemia) 09/27/2011   Migraine 09/07/2011   Essential hypertension, benign 11/01/2008   Gastroparesis 09/27/2008   IRREGULAR MENSES  08/24/2008   Diabetes mellitus out of control ([place]) 07/04/2006    Orientation RESPIRATION BLADDER Height & Weight     Self, Time, Situation, Place  Normal External catheter Weight: (!) 167.3 kg Height:  '5\' 2"'$  (157.5 cm)  BEHAVIORAL SYMPTOMS/MOOD NEUROLOGICAL BOWEL NUTRITION STATUS      Continent Diet (See DC summary)   AMBULATORY STATUS COMMUNICATION OF NEEDS Skin   Extensive Assist Verbally Other (Comment) (BKA- right - cellulitis)                       Personal Care Assistance Level of Assistance  Bathing, Feeding, Dressing Bathing Assistance: Maximum assistance Feeding assistance: Limited assistance Dressing Assistance: Maximum assistance     Functional Limitations Info  Sight, Speech, Hearing Sight Info: Adequate Hearing Info: Adequate Speech Info: Adequate    SPECIAL CARE FACTORS FREQUENCY  PT (By licensed PT)     PT Frequency: 5 times a week              Contractures Contractures Info: Not present    Additional Factors Info  Code Status, Insulin Sliding Scale, Allergies Code Status Info: FULL Allergies Info: Bee Venom, Penicillins, Lorazepam, Lovenox, Metoclopramide   Insulin Sliding Scale Info: CBG 70 - 120: 0 units   CBG 121 - 150: 0 units   CBG 151 - 200: 0 units   CBG 201 - 250: 2 units   CBG 251 - 300: 3 units   CBG 301 - 350: 4 units   CBG 351 - 400: 5 units   CBG > 400 call MD and obtain STAT lab verification       Current Medications (11/30/2020):  This is the current hospital active medication list Current Facility-Administered Medications  Medication Dose Route Frequency Provider Last Rate Last Admin   0.9 %  sodium chloride infusion  100 mL Intravenous PRN Gean Quint, MD       0.9 %  sodium chloride infusion  100 mL Intravenous PRN Gean Quint, MD       acetaminophen (TYLENOL) tablet 650 mg  650 mg Oral Q6H PRN [name], MD   650 mg at 11/26/20 2218   Or   acetaminophen (TYLENOL) suppository 650 mg  650 mg Rectal Q6H PRN Early, Arvilla Meres, MD       albuterol (VENTOLIN HFA) 108 (90 Base) MCG/ACT inhaler 2 puff  2 puff Inhalation Q6H PRN Early, Arvilla Meres, MD       alteplase (CATHFLO ACTIVASE) injection 2 mg  2 mg Intracatheter Once PRN Gean Quint, MD       calcium acetate (PHOSLO) capsule 1,334 mg  1,334 mg Oral TID WC [name], MD   1,334 mg at 11/30/20  1222   carvedilol (COREG) tablet 12.5 mg  12.5 mg Oral BID [name], MD   12.5 mg at 11/30/20 [id number]   Chlorhexidine Gluconate Cloth 2 % PADS 6 each  6 each Topical Q0600 Early, Arvilla Meres, MD   6 each at 11/30/20 0501   Darbepoetin Alfa (ARANESP) injection 200 mcg  200 mcg Subcutaneous Q Mon-1800 Early, Todd F, MD   200 mcg at 11/28/20 1731   ferric gluconate (FERRLECIT) 250 mg in sodium chloride 0.9 % 250 mL IVPB  250 mg Intravenous Daily [name], MD 270 mL/hr at 11/30/20 1500 250 mg at 11/30/20 1500   furosemide (LASIX) 160 mg in dextrose 5 % 50 mL IVPB  160 mg Intravenous Q8H Early, Arvilla Meres, MD  66 mL/hr at 11/30/20 1030 160 mg at 11/30/20 1030   hydrALAZINE (APRESOLINE) tablet 50 mg  50 mg Oral TID [name], MD   50 mg at 11/29/20 2200   insulin aspart (novoLOG) injection 0-5 Units  0-5 Units Subcutaneous QHS Tat, Shanon Brow, MD       insulin aspart (novoLOG) injection 0-6 Units  0-6 Units Subcutaneous TID WC Tat, David, MD       insulin glargine-yfgn (SEMGLEE) injection 4 Units  4 Units Subcutaneous QHS Tat, Shanon Brow, MD       isosorbide mononitrate (IMDUR) 24 hr tablet 30 mg  30 mg Oral Daily Early, Arvilla Meres, MD   30 mg at 11/30/20 [id number]   levETIRAcetam (KEPPRA) tablet 500 mg  500 mg Oral BID [name], MD   500 mg at 11/30/20 [id number]   metolazone (ZAROXOLYN) tablet 10 mg  10 mg Oral Daily [name], MD   10 mg at 11/30/20 [id number]   mupirocin ointment (BACTROBAN) 2 % 1 application  1 application Nasal BID [name], MD   1 application at [id number]   ondansetron (ZOFRAN) tablet 4 mg  4 mg Oral Q6H PRN Early, Arvilla Meres, MD       Or   ondansetron [facility]) injection 4 mg  4 mg Intravenous Q6H PRN Early, Arvilla Meres, MD       oxyCODONE (Oxy IR/ROXICODONE) immediate release tablet 5 mg  5 mg Oral Q4H PRN Early, Arvilla Meres, MD   5 mg at 11/29/20 2200   polyethylene glycol (MIRALAX / GLYCOLAX) packet 17 g  17 g Oral Daily PRN Early, Arvilla Meres, MD       sodium bicarbonate tablet 650 mg  650 mg Oral TID  [name], MD   650 mg at 11/30/20 [id number]   tamsulosin (FLOMAX) capsule 0.4 mg  0.4 mg Oral QPC supper [name], MD   0.4 mg at 11/29/20 1731     Discharge Medications: Please see discharge summary for a list of discharge medications.  Relevant Imaging Results:  Relevant Lab Results:   Additional Information SS# [id number]  Boneta Lucks, RN

## 2020-11-30 NOTE — TOC Progression Note (Signed)
Transition of Care [facility] - Progression Note    Patient Details  Name: Sheila Austin MRN: [id number] Date of Birth: [birth year]  Transition of Care [facility]) CM/SW Contact  Boneta Lucks, RN Phone Number: 11/30/2020, 4:53 PM  Clinical Narrative:   PT is recommending SNF. Patient is agreeable. FL2 completed and sent out to multiple facilities.  Patient also referred out to [facility] for chair time. TOC to follow.   Expected Discharge Plan: Skilled Nursing Facility Barriers to Discharge: Continued Medical Work up  Expected Discharge Plan and Services Expected Discharge Plan: [place]                  Readmission Risk Interventions Readmission Risk Prevention Plan 11/30/2020 11/28/2020  Transportation Screening Complete Complete  PCP or Specialist Appt within 3-5 Days - Complete  HRI or [place] - Complete  Social Work Consult for [place] Planning/Counseling - Complete  Palliative Care Screening - Not Applicable  Medication Review Press photographer) Complete Complete  HRI or Home Care Consult Complete -  SW Recovery Care/Counseling Consult Complete -  Palliative Care Screening Not Applicable -  Skilled Nursing Facility Complete -  Some recent data might be hidden

## 2020-11-30 NOTE — Plan of Care (Signed)
  Problem: Acute Rehab PT Goals(only PT should resolve) Goal: Pt will Roll Supine to Side Outcome: Progressing Flowsheets (Taken 11/30/2020 1412) Pt will Roll Supine to Side: with modified independence Goal: Pt Will Go Supine/Side To Sit Outcome: Progressing Flowsheets (Taken 11/30/2020 1412) Pt will go Supine/Side to Sit:  with supervision  with modified independence Goal: Pt Will Go Sit To Supine/Side Outcome: Progressing Flowsheets (Taken 11/30/2020 1412) Pt will go Sit to Supine/Side:  with modified independence  with supervision Goal: Patient Will Perform Sitting Balance Outcome: Progressing Flowsheets (Taken 11/30/2020 1412) Patient will perform sitting balance: Independently Goal: Patient Will Transfer Sit To/From Stand Outcome: Progressing Flowsheets (Taken 11/30/2020 1412) Patient will transfer sit to/from stand: with moderate assist Goal: Pt Will Transfer Bed To Chair/Chair To Bed Outcome: Progressing Flowsheets (Taken 11/30/2020 1412) Pt will Transfer Bed to Chair/Chair to Bed: with mod assist   2:13 PM, 11/30/20 Lonell Grandchild, MPT Physical Therapist with [name] [phone] office [phone] mobile phone

## 2020-11-30 NOTE — Procedures (Signed)
   INITIAL HEMODIALYSIS TREATMENT NOTE:  Indications / risks / benefits of renal replacement therapy were discussed at length with pt prior to obtaining consent for first ever dialysis treatment on 11/30/20.  Right IJ TDC placed yesterday by Dr. Donnetta Hutching tolerated prescribed flows with stable pressures.  Dressing was changed; entry site is unremarkable.  1.5 hour heparin-free treatment completed.  Goal met: 500cc removed without interruption UF.  Ferrlecit 262m given over one hour. Hemodynamically stable.  All blood was returned.   No changes from pre-HD assessment.    ARockwell Alexandria RN

## 2020-11-30 NOTE — Evaluation (Addendum)
Physical Therapy Evaluation Patient Details Name: Sheila Austin MRN: [id number] DOB: [birth year] Today's Date: 11/30/2020   History of Present Illness  Sheila Austin  is a 35 y.o. female,with hx of CHF, DMII, HTN, GERD, morbid obesity, and chronic kidney disease presents to the ED with a chief complaint of peripheral edema.  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  She reports that she has a bariatric wheelchair at home, but she and her fianc live in a mobile home.  There are parts of the home, including the bathroom, where the bariatric wheelchair cannot access.  Patient usually crawls to the bathroom.  She reports that she has had increased swelling and peripheral edema, but she really started to notice it today, because she could not bend her knee to crawl on the floor.  She reports she can even get off the couch.  She also notes that she has had a decrease in urine output.  The symptoms have been associated with dyspnea on exertion, orthopnea, wheeze, and palpitations that last minutes.  Her symptoms are worse with exertion, better with rest.  She reports no cough.  She does have 2 L nasal cannula prescribed at home to wear 24/7.  She says she wears it at night and then she wears it when she needs it.  She has not been wearing it today.  Patient denies any fevers.  She has not had any dysuria or hematuria.  Patient is not on dialysis but her nephrologist in [place] has discussed this with her.  Dialysis discussion started in May 2022 and patient has been lost to follow-up since then as she does not have transport to get to her nephrologist in [place].  The discussion seem to be around peritoneal dialysis per patient's description.   Clinical Impression  Patient demonstrates slow labored movement for sitting up at bedside requiring use of bed rail due to generalized weakness.  Patient demonstrates fair/good return for scooting forward and laterally  at bedside, but limited due to fatigue and tolerated sitting up at bedside after therapy.  Patient encouraged to sit up at bedside for meals with nursing staff assisting - NT notified.  Patient will benefit from continued physical therapy in hospital and recommended venue below to increase strength, balance, endurance for safe ADLs and gait.      Follow Up Recommendations SNF PT;Supervision for mobility/OOB;Supervision - Intermittent    Equipment Recommendations  None recommended by PT    Recommendations for Other Services       Precautions / Restrictions Precautions Precautions: Fall Restrictions Weight Bearing Restrictions: No      Mobility  Bed Mobility Overal bed mobility: Needs Assistance Bed Mobility: Supine to Sit     Supine to sit: Min assist     General bed mobility comments: increased time, labored movement, required use of bed rail    Transfers                    Ambulation/Gait                Stairs            Wheelchair Mobility    Modified Rankin (Stroke Patients Only)       Balance Overall balance assessment: Needs assistance Sitting-balance support: Feet supported;No upper extremity supported Sitting balance-Leahy Scale: Fair Sitting balance - Comments: fair/good supporting self with BUE seated at EOB  Pertinent Vitals/Pain Pain Assessment: No/denies pain    Home Living Family/patient expects to be discharged to:: Private residence Living Arrangements: Non-relatives/Friends Paramedic) Available Help at Discharge: Family;Available 24 hours/day Type of Home: Mobile home Home Access: Stairs to enter Entrance Stairs-Rails: Right;Left;Can reach both Entrance Stairs-Number of Steps: 5 Home Layout: One level Home Equipment: Walker - 2 wheels;Bedside commode;Wheelchair - manual      Prior Function Level of Independence: Needs assistance   Gait / Transfers Assistance  Needed: does not walk, has to crawl to get into wheelchair, bed and commode since Right BKA surgery  ADL's / Homemaking Assistance Needed: assisted by family        Hand Dominance   Dominant Hand: Right    Extremity/Trunk Assessment   Upper Extremity Assessment Upper Extremity Assessment: Generalized weakness    Lower Extremity Assessment Lower Extremity Assessment: Generalized weakness    Cervical / Trunk Assessment Cervical / Trunk Assessment: Normal  Communication   Communication: No difficulties  Cognition Arousal/Alertness: Awake/alert Behavior During Therapy: WFL for tasks assessed/performed Overall Cognitive Status: Within Functional Limits for tasks assessed                                        General Comments      Exercises     Assessment/Plan    PT Assessment Patient needs continued PT services  PT Problem List Decreased strength;Decreased activity tolerance;Decreased balance;Decreased mobility       PT Treatment Interventions DME instruction;Functional mobility training;Therapeutic activities;Therapeutic exercise;Balance training;Patient/family education    PT Goals (Current goals can be found in the Care Plan section)  Acute Rehab PT Goals Patient Stated Goal: return home with fiance to assist PT Goal Formulation: With patient Time For Goal Achievement: 12/07/20 Potential to Achieve Goals: Good    Frequency Min 3X/week   Barriers to discharge        Co-evaluation               AM-PAC PT "6 Clicks" Mobility  Outcome Measure Help needed turning from your back to your side while in a flat bed without using bedrails?: A Little Help needed moving from lying on your back to sitting on the side of a flat bed without using bedrails?: A Little Help needed moving to and from a bed to a chair (including a wheelchair)?: Total Help needed standing up from a chair using your arms (e.g., wheelchair or bedside chair)?: Total Help  needed to walk in hospital room?: Total Help needed climbing 3-5 steps with a railing? : Total 6 Click Score: 10    End of Session   Activity Tolerance: Patient tolerated treatment well;Patient limited by fatigue Patient left: in bed;with call bell/phone within reach;Other (comment) (left seated at bedside) Nurse Communication: Mobility status PT Visit Diagnosis: Unsteadiness on feet (R26.81);Other abnormalities of gait and mobility (R26.89);Muscle weakness (generalized) (M62.81)    Time: [id number] PT Time Calculation (min) (ACUTE ONLY): 20 min   Charges:   PT Evaluation $PT Eval Moderate Complexity: 1 Mod PT Treatments $Therapeutic Activity: 8-22 mins        2:10 PM, 11/30/20 Lonell Grandchild, MPT Physical Therapist with [facility] [phone] office [phone] mobile phone

## 2020-11-30 NOTE — Progress Notes (Signed)
Subjective:  uop 4L, s/p rij tdc placement with Dr. Donnetta Hutching yesterday, tolerted well. Very eager to start HD, still having intractable pruritis Objective Vital signs in last 24 hours: Vitals:   11/29/20 1621 11/29/20 2116 11/30/20 0425 11/30/20 0500  BP: (!) 145/83 134/81 135/85   Pulse: 73 79 81   Resp: '18 18 18   '$ Temp:  (!) 97.5 F (36.4 C) (!) 97.5 F (36.4 C)   TempSrc:  Oral Oral   SpO2: 100% 98% 98%   Weight:    (!) 167.2 kg  Height:       Weight change: -5.7 kg  Intake/Output Summary (Last 24 hours) at 11/30/2020 0857 Last data filed at 11/30/2020 0400 Gross per 24 hour  Intake 1270 ml  Output 4005 ml  Net -2735 ml    Assessment/Plan: 35 year old WF with advanced CKD in the setting of poorly controlled DM and HTN, poor social situation not allowing appropriate follow up-  now with massive volume overload-  anemia , metabolic acidosis and hyperkalemia 1.Renal- underlying CKD 5-  progressive for a while.  Now ESRD. S/p [facility] placement 7/26 with Dr. Donnetta Hutching, vein mapping to be done for permanent access. Start HD 7/27, slow start protocol. Transport to OP dialysis might be an issue given her mobility issues but can get SW started on the arrangements--CLIP. Continue with diuresis for now until we can achieve adequate UF with HD. Stopping lokelma 2. Hypertension/volume  - hypertensive mostly due to volume, have been able to achieve diuresis-  have stopped norvasc-  max medical diuresis-  BP stable 3. Anemia  - likely due to CKD-  iron stores low , iron load/replete with ferrlecit and treating w/ esa  4. Bones-  PTH 114.  Her corrected calcium is on the 8's-  there is an elevated phos- onphoslo 5. Dispo-  I cant help but wonder if she might benefit from a SNF placement for rehab or some OP rehab so that she can be more mobile if that is even possible--per primary service   Sheila Austin    Labs: Basic Metabolic Panel: Recent Labs  Lab 11/28/20 0549 11/29/20 0432 11/30/20 0434  NA  135 135 135  K 5.6* 5.2* 5.0  CL 110 110 108  CO2 18* 15* 17*  GLUCOSE 96 97 81  BUN 75* 76* 78*  CREATININE 6.76* 6.86* 6.87*  CALCIUM 6.4* 6.5* 6.6*  PHOS 7.7* 7.9* 7.6*   Liver Function Tests: Recent Labs  Lab 11/25/20 1801 11/26/20 0806 11/27/20 0559 11/28/20 0549 11/29/20 0432 11/30/20 0434  AST 15 10*  --   --   --   --   ALT 9 10  --   --   --   --   ALKPHOS 189* 171*  --   --   --   --   BILITOT 0.6 0.7  --   --   --   --   PROT 6.1* 5.7*  --   --   --   --   ALBUMIN 2.3* 2.1*   < > 2.0* 2.1* 2.0*   < > = values in this interval not displayed.   No results for input(s): LIPASE, AMYLASE in the last 168 hours. No results for input(s): AMMONIA in the last 168 hours. CBC: Recent Labs  Lab 11/25/20 1801 11/26/20 0806 11/30/20 0434  WBC 9.6 7.6 8.3  HGB 7.6* 7.3* 7.0*  HCT 24.9* 24.3* 22.8*  MCV 95.8 96.4 95.8  PLT 204 179 169  Cardiac Enzymes: No results for input(s): CKTOTAL, CKMB, CKMBINDEX, TROPONINI in the last 168 hours. CBG: Recent Labs  Lab 11/29/20 1224 11/29/20 1452 11/29/20 1621 11/29/20 2113 11/30/20 0742  GLUCAP 92 85 93 100* 76    Iron Studies:  No results for input(s): IRON, TIBC, TRANSFERRIN, FERRITIN in the last 72 hours.  Studies/Results: DG Chest Port 1 View  Result Date: 11/29/2020 CLINICAL DATA:  Status post dialysis catheter placement. EXAM: PORTABLE CHEST 1 VIEW COMPARISON:  11/26/2020 FINDINGS: Stable cardiac enlargement. Interval placement of right jugular tunneled dialysis catheter with catheter tip overlying the right atrium. No pneumothorax. Slight decrease in pulmonary vascular congestion. No pleural effusions. IMPRESSION: Dialysis catheter tip overlies the right atrium. No pneumothorax. Slight decrease in pulmonary vascular congestion. Electronically Signed   By: Aletta Edouard M.D.   On: 11/29/2020 16:17   DG C-Arm 1-60 Min-No Report  Result Date: 11/29/2020 Fluoroscopy was utilized by the requesting physician.  No  radiographic interpretation.   Medications: Infusions:  ferric gluconate (FERRLECIT) IVPB 250 mg (11/29/20 1059)   furosemide 160 mg (11/30/20 0131)    Scheduled Medications:  calcium acetate  1,334 mg Oral TID WC   carvedilol  12.5 mg Oral BID   Chlorhexidine Gluconate Cloth  6 each Topical Q0600   darbepoetin (ARANESP) injection - NON-DIALYSIS  200 mcg Subcutaneous Q Mon-1800   hydrALAZINE  50 mg Oral TID   insulin aspart  0-15 Units Subcutaneous TID WC   insulin aspart  0-5 Units Subcutaneous QHS   insulin glargine  7 Units Subcutaneous QHS   isosorbide mononitrate  30 mg Oral Daily   levETIRAcetam  500 mg Oral BID   metolazone  10 mg Oral Daily   mupirocin ointment  1 application Nasal BID   sodium bicarbonate  650 mg Oral TID   sodium zirconium cyclosilicate  10 g Oral BID   tamsulosin  0.4 mg Oral QPC supper    have reviewed scheduled and prn medications.  Physical Exam: General:  alert, pleasant- NAD Heart: RRR Lungs: mostly clear Abdomen: obese, soft non tender Extremities: pitting but also chronic woody edema present Dialysis Access: RIJ TDC c/d/i   11/30/2020,8:57 AM  LOS: 5 days

## 2020-11-30 NOTE — Progress Notes (Signed)
PROGRESS NOTE  Sheila Austin [id number] DOB: 1986/01/10 DOA: 11/25/2020 PCP: Pcp, No  Brief History:  As per H&P written by Dr. Clearence Ped on 11/26/20 35 y.o. female,with hx of CHF, DMII, HTN, GERD, morbid obesity, and chronic kidney disease presents to the ED with a chief complaint of peripheral edema.  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  She reports that she has a bariatric wheelchair at home, but she and her fianc live in a mobile home.  There are parts of the home, including the bathroom, where the bariatric wheelchair cannot access.  Patient usually crawls to the bathroom.  She reports that she has had increased swelling and peripheral edema, but she really started to notice it today, because she could not bend her knee to crawl on the floor.  She reports she can even get off the couch.  She also notes that she has had a decrease in urine output.  The symptoms have been associated with dyspnea on exertion, orthopnea, wheeze, and palpitations that last minutes.  Her symptoms are worse with exertion, better with rest.  She reports no cough.  She does have 2 L nasal cannula prescribed at home to wear 24/7.  She says she wears it at night and then she wears it when she needs it.  She has not been wearing it today.  Patient denies any fevers.  She has not had any dysuria or hematuria.  Patient is not on dialysis but her nephrologist in [place] has discussed this with her.  Dialysis discussion started in May 2022 and patient has been lost to follow-up since then as she does not have transport to get to her nephrologist in [place].   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 -appreciate nephrology -continue IV Lasix and continued use of metolazone as dictated by nephrology service.   -vascular surgery consulted and  planning [facility] 11/29/20. -Will follow nephrology service recommendations about dialysis initiation. -continue sodium bicarbonate -d/c daily Lokelma now that patient is on HD -first HD 7/27 -Follow clinical response. -Continue to follow electrolytes trend.   2-hypertension -In the setting of fluid overload most likely -Continue max dose of IV diuretics -Continue coreg, hydralazine, imdur -anticipate improvement with HD -Norvasc has been discontinued. -Low-sodium/renal diet discussed with patient.   3-type 2 diabetes with nephropathy -Continue sliding scale insulin and follow CBGs fluctuation. -09/24/20 A1C--5.9   4-anemia of chronic kidney disease -iron saturation 16, ferritin 164 -IV iron therapy and Epogen as per nephrology discretion. -Last hemoglobin check 7.3; will repeat CBC and follow hemoglobin trend. -No overt bleeding appreciated.Marland Kitchen   5-secondary hyperparathyroidism/hypocalcemia -Calcium gluconate given on 11/26/2020; repeat calcium today 6.4 but corrected for her hypoalbuminemia 8.0. -Continue monitoring electrolytes trend  -PhosLo has been added -Follow electrolytes trend.   6-super morbid obesity -Body mass index is 69.72 kg/m. -Low calorie diet, portion control and increase physical activity discussed with patient.      Status is: Inpatient  Remains inpatient appropriate because:Persistent severe electrolyte disturbances  Dispo: The patient is from: Home              Anticipated d/c is to: SNF              Patient currently is not medically stable to d/c.   Difficult to place patient No        Family Communication:  no Family at bedside  Consultants:  renal, vascular surgery  Code Status:  FULL   DVT Prophylaxis:  [place] Heparin   Procedures: As Listed in Progress Note Above  Antibiotics: None  Total time spent 35 minutes.  Greater than 50% spent face to face counseling and coordinating care.     Subjective: Patient is breathing a  little better, but remains sob.  Denies f/c, cp, n/v/d, abd pain  Objective: Vitals:   11/30/20 0425 11/30/20 0500 11/30/20 0923 11/30/20 1309  BP: 135/85  122/71 136/76  Pulse: 81  80 78  Resp: 18   17  Temp: (!) 97.5 F (36.4 C)   98.3 F (36.8 C)  TempSrc: Oral   Oral  SpO2: 98%   97%  Weight:  (!) 167.2 kg    Height:        Intake/Output Summary (Last 24 hours) at 11/30/2020 1513 Last data filed at 11/30/2020 1300 Gross per 24 hour  Intake 970 ml  Output 2150 ml  Net -1180 ml   Weight change: -5.7 kg Exam:  General:  Pt is alert, follows commands appropriately, not in acute distress HEENT: No icterus, No thrush, No neck mass, [place]/AT Cardiovascular: RRR, S1/S2, no rubs, no gallops Respiratory: bibasilar rales Abdomen: Soft/+BS, non tender, non distended, no guarding Extremities: 4+ LLE edema, No lymphangitis, No petechiae, No rashes, no synovitis;  R-AKA   Data Reviewed: I have personally reviewed following labs and imaging studies Basic Metabolic Panel: Recent Labs  Lab 11/25/20 1801 11/26/20 0806 11/27/20 0559 11/28/20 0549 11/29/20 0432 11/30/20 0434  NA 135 137 136 135 135 135  K 5.8* 5.7* 5.5* 5.6* 5.2* 5.0  CL 115* 114* 112* 110 110 108  CO2 14* 15* 16* 18* 15* 17*  GLUCOSE 150* 104* 99 96 97 81  BUN 70* 68* 71* 75* 76* 78*  CREATININE 6.83* 6.54* 6.36* 6.76* 6.86* 6.87*  CALCIUM 6.0* 6.2* 6.4* 6.4* 6.5* 6.6*  MG 1.6* 1.5*  --   --   --   --   PHOS  --  7.6* 7.6* 7.7* 7.9* 7.6*   Liver Function Tests: Recent Labs  Lab 11/25/20 1801 11/26/20 0806 11/27/20 0559 11/28/20 0549 11/29/20 0432 11/30/20 0434  AST 15 10*  --   --   --   --   ALT 9 10  --   --   --   --   ALKPHOS 189* 171*  --   --   --   --   BILITOT 0.6 0.7  --   --   --   --   PROT 6.1* 5.7*  --   --   --   --   ALBUMIN 2.3* 2.1* 2.0* 2.0* 2.1* 2.0*   No results for input(s): LIPASE, AMYLASE in the last 168 hours. No results for input(s): AMMONIA in the last 168  hours. Coagulation Profile: No results for input(s): INR, PROTIME in the last 168 hours. CBC: Recent Labs  Lab 11/25/20 1801 11/26/20 0806 11/30/20 0434  WBC 9.6 7.6 8.3  HGB 7.6* 7.3* 7.0*  HCT 24.9* 24.3* 22.8*  MCV 95.8 96.4 95.8  PLT 204 179 169   Cardiac Enzymes: No results for input(s): CKTOTAL, CKMB, CKMBINDEX, TROPONINI in the last 168 hours. BNP: Invalid input(s): POCBNP CBG: Recent Labs  Lab 11/29/20 1452 11/29/20 1621 11/29/20 2113 11/30/20 0742 11/30/20 1119  GLUCAP 85 93 100* 76 121*   HbA1C: No results for input(s): HGBA1C in the last 72 hours. Urine analysis:  Component Value Date/Time   COLORURINE YELLOW 11/25/2020 2037   APPEARANCEUR CLEAR 11/25/2020 2037   LABSPEC 1.011 11/25/2020 2037   PHURINE 6.0 11/25/2020 2037   GLUCOSEU 150 (A) 11/25/2020 2037   HGBUR SMALL (A) 11/25/2020 2037   BILIRUBINUR NEGATIVE 11/25/2020 2037   BILIRUBINUR negative 08/13/2017 1622   KETONESUR NEGATIVE 11/25/2020 2037   PROTEINUR >=300 (A) 11/25/2020 2037   UROBILINOGEN 0.2 08/13/2017 1622   UROBILINOGEN 0.2 11/25/2014 0553   NITRITE NEGATIVE 11/25/2020 2037   LEUKOCYTESUR NEGATIVE 11/25/2020 2037   Sepsis Labs: '@LABRCNTIP'$ (procalcitonin:4,lacticidven:4) ) Recent Results (from the past 240 hour(s))  SARS CORONAVIRUS 2 ([place] 6-24 HRS) Nasopharyngeal Nasopharyngeal Swab     Status: None   Collection Time: 11/25/20  8:00 PM   Specimen: Nasopharyngeal Swab  Result Value Ref Range Status   SARS Coronavirus 2 NEGATIVE NEGATIVE Final    Comment: (NOTE) SARS-CoV-2 target nucleic acids are NOT DETECTED.  The SARS-CoV-2 RNA is generally detectable in upper and lower respiratory specimens during the acute phase of infection. Negative results do not preclude SARS-CoV-2 infection, do not rule out co-infections with other pathogens, and should not be used as the sole basis for treatment or other patient management decisions. Negative results must be combined with  clinical observations, patient history, and epidemiological information. The expected result is Negative.  Fact Sheet for Patients: SugarRoll.be  Fact Sheet for Healthcare Providers: https://www.woods-mathews.com/  This test is not yet approved or cleared by the [place] FDA and  has been authorized for detection and/or diagnosis of SARS-CoV-2 by FDA under an Emergency Use Authorization (EUA). This EUA will remain  in effect (meaning this test can be used) for the duration of the COVID-19 declaration under Se ction 564(b)(1) of the Act, 21 U.S.C. section 360bbb-3(b)(1), unless the authorization is terminated or revoked sooner.  Performed at [facility], [place] [address], [place], [place] [address]   Surgical pcr screen     Status: Abnormal   Collection Time: 11/28/20 10:11 PM   Specimen: Nasal Mucosa; Nasal Swab  Result Value Ref Range Status   MRSA, PCR POSITIVE (A) NEGATIVE Final    Comment: RESULT CALLED TO, READ BACK BY AND VERIFIED WITH: WATLINGTON,J'@0307'$  BY MATTHEWS, B 7.26.22    Staphylococcus aureus POSITIVE (A) NEGATIVE Final    Comment: RESULT CALLED TO, READ BACK BY AND VERIFIED WITH: WATLINGTON,J'@0307'$  BY MATTHEWS, B 7.26.22 (NOTE) The Xpert SA Assay (FDA approved for NASAL specimens in patients 73 years of age and older), is one component of a comprehensive surveillance program. It is not intended to diagnose infection nor to guide or monitor treatment. Performed at [facility], [address] [place],  [address]      Scheduled Meds:  calcium acetate  1,334 mg Oral TID WC   carvedilol  12.5 mg Oral BID   Chlorhexidine Gluconate Cloth  6 each Topical Q0600   darbepoetin (ARANESP) injection - NON-DIALYSIS  200 mcg Subcutaneous Q Mon-1800   hydrALAZINE  50 mg Oral TID   insulin aspart  0-5 Units Subcutaneous QHS   insulin aspart  0-6 Units Subcutaneous TID WC   insulin glargine-yfgn  4 Units  Subcutaneous QHS   isosorbide mononitrate  30 mg Oral Daily   levETIRAcetam  500 mg Oral BID   metolazone  10 mg Oral Daily   mupirocin ointment  1 application Nasal BID   sodium bicarbonate  650 mg Oral TID   tamsulosin  0.4 mg Oral QPC supper   Continuous Infusions:  ferric gluconate (FERRLECIT) IVPB 250 mg (11/29/20 1059)   furosemide 160 mg (11/30/20 1030)    Procedures/Studies: DG Chest Port 1 View  Result Date: 11/29/2020 CLINICAL DATA:  Status post dialysis catheter placement. EXAM: PORTABLE CHEST 1 VIEW COMPARISON:  11/26/2020 FINDINGS: Stable cardiac enlargement. Interval placement of right jugular tunneled dialysis catheter with catheter tip overlying the right atrium. No pneumothorax. Slight decrease in pulmonary vascular congestion. No pleural effusions. IMPRESSION: Dialysis catheter tip overlies the right atrium. No pneumothorax. Slight decrease in pulmonary vascular congestion. Electronically Signed   By: Aletta Edouard M.D.   On: 11/29/2020 16:17   Portable chest 1 View  Result Date: 11/26/2020 CLINICAL DATA:  Pleural effusion. EXAM: PORTABLE CHEST 1 VIEW COMPARISON:  November 25, 2020. FINDINGS: Stable cardiomediastinal silhouette. Mild central pulmonary vascular congestion may be present. Right basilar opacity is noted concerning for possible atelectasis and effusion. Bony thorax is unremarkable. IMPRESSION: Mild central pulmonary vascular congestion. Right basilar opacity is noted concerning for possible atelectasis and pleural effusion. Electronically Signed   By: Marijo Conception M.D.   On: 11/26/2020 10:10   DG Chest Portable 1 View  Result Date: 11/25/2020 CLINICAL DATA:  Edema. The patient states generalized weakness and swelling to lower limbs in the last 4 days. States SOB on exertion/ slight movement. Hx of diabetes, pulmonary edema, CHF, and HTN. Not being tested for covid-19 EXAM: PORTABLE CHEST 1 VIEW.  Patient is rotated. COMPARISON:  CT abdomen pelvis 10/20/2020  FINDINGS: Enlarged cardiac silhouette. The heart size and mediastinal contours are otherwise unchanged. Again noted is slightly more lucent left hemithorax compared to the right. No focal consolidation. Mild pulmonary edema. No pneumothorax. No acute osseous abnormality. IMPRESSION: 1. Bilateral pleural effusions that are difficult to evaluate on this AP semi upright view. Given opacity overlying the right hemithorax, there is a likely larger than trace right pleural effusion. Recommend PA and lateral view of the chest for further evaluation given size of effusions on CT chest 10/20/20. 2. Persistent enlargement of the cardiac silhouette. Underlying pericardial effusion not excluded. 3. Mild pulmonary edema. Electronically Signed   By: Iven Finn M.D.   On: 11/25/2020 19:11   DG C-Arm 1-60 Min-No Report  Result Date: 11/29/2020 Fluoroscopy was utilized by the requesting physician.  No radiographic interpretation.   ECHOCARDIOGRAM COMPLETE  Result Date: 11/26/2020    ECHOCARDIOGRAM REPORT   Patient Name:   Sheila Austin Date of Exam: 11/26/2020 Medical Rec #:  [id number]        Height:       62.0 in Accession #:    [id number]       Weight:       382.7 lb Date of Birth:  [birth year]        BSA:          2.519 m Patient Age:    71 years         BP:           181/96 mmHg Patient Gender: F                HR:           80 bpm. Exam Location:  [facility] Procedure: 2D Echo, Cardiac Doppler and Color Doppler Indications:    CHF  History:        Patient has prior history of Echocardiogram examinations, most                 recent 09/24/2020. CHF; Risk  Factors:Hypertension, Diabetes and                 Dyslipidemia.  Sonographer:    Wenda Low Referring Phys: [id number] ASIA B [place]  1. Left ventricular ejection fraction, by estimation, is 60 to 65%. The left ventricle has normal function. The left ventricle has no regional wall motion abnormalities. There is mild concentric left ventricular  hypertrophy. Left ventricular diastolic parameters are indeterminate.  2. Prominent moderator band present. Right ventricular systolic function is severely reduced. The right ventricular size is severely enlarged. There is mildly elevated pulmonary artery systolic pressure.  3. A small pericardial effusion is present. The pericardial effusion is circumferential.  4. The mitral valve is normal in structure. Trivial mitral valve regurgitation. No evidence of mitral stenosis.  5. Tricuspid valve regurgitation is mild to moderate.  6. The aortic valve is normal in structure. Aortic valve regurgitation is not visualized. No aortic stenosis is present.  7. The inferior vena cava is normal in size with greater than 50% respiratory variability, suggesting right atrial pressure of 3 mmHg. FINDINGS  Left Ventricle: Left ventricular ejection fraction, by estimation, is 60 to 65%. The left ventricle has normal function. The left ventricle has no regional wall motion abnormalities. The left ventricular internal cavity size was normal in size. There is  mild concentric left ventricular hypertrophy. Left ventricular diastolic parameters are indeterminate. Normal left ventricular filling pressure. Right Ventricle: Prominent moderator band present. The right ventricular size is severely enlarged. No increase in right ventricular wall thickness. Right ventricular systolic function is severely reduced. There is mildly elevated pulmonary artery systolic pressure. The tricuspid regurgitant velocity is 2.88 m/s, and with an assumed right atrial pressure of 8 mmHg, the estimated right ventricular systolic pressure is [id number] mmHg. Left Atrium: Left atrial size was normal in size. Right Atrium: Right atrial size was normal in size. Pericardium: A small pericardial effusion is present. The pericardial effusion is circumferential. Mitral Valve: The mitral valve is normal in structure. There is mild thickening of the mitral valve leaflet(s).  Trivial mitral valve regurgitation. No evidence of mitral valve stenosis. MV peak gradient, 8.0 mmHg. The mean mitral valve gradient is 2.0 mmHg. Tricuspid Valve: The tricuspid valve is normal in structure. Tricuspid valve regurgitation is mild to moderate. No evidence of tricuspid stenosis. Aortic Valve: The aortic valve is normal in structure. Aortic valve regurgitation is not visualized. No aortic stenosis is present. Aortic valve mean gradient measures 6.0 mmHg. Aortic valve peak gradient measures 8.8 mmHg. Aortic valve area, by VTI measures 2.01 cm. Pulmonic Valve: The pulmonic valve was normal in structure. Pulmonic valve regurgitation is trivial. No evidence of pulmonic stenosis. Aorta: The aortic root is normal in size and structure. Venous: The inferior vena cava is normal in size with greater than 50% respiratory variability, suggesting right atrial pressure of 3 mmHg. IAS/Shunts: No atrial level shunt detected by color flow Doppler.  LEFT VENTRICLE PLAX 2D LVIDd:         4.71 cm  Diastology LVIDs:         3.45 cm  LV e' medial:    0.13 cm/s LV PW:         1.26 cm  LV E/e' medial:  11.1 LV IVS:        1.24 cm  LV e' lateral:   0.10 cm/s LVOT diam:     2.00 cm  LV E/e' lateral: 14.5 LV SV:         68  LV SV Index:   27 LVOT Area:     3.14 cm  RIGHT VENTRICLE RV Basal diam:  4.05 cm RV Mid diam:    4.65 cm LEFT ATRIUM             Index       RIGHT ATRIUM           Index LA diam:        4.50 cm 1.79 cm/m  RA Area:     18.50 cm LA Vol (A2C):   37.9 ml 15.05 ml/m RA Volume:   49.20 ml  19.53 ml/m LA Vol (A4C):   52.5 ml 20.84 ml/m LA Biplane Vol: 46.5 ml 18.46 ml/m  AORTIC VALVE AV Area (Vmax):    2.29 cm AV Area (Vmean):   1.99 cm AV Area (VTI):     2.01 cm AV Vmax:           148.00 cm/s AV Vmean:          119.000 cm/s AV VTI:            0.339 m AV Peak Grad:      8.8 mmHg AV Mean Grad:      6.0 mmHg LVOT Vmax:         108.00 cm/s LVOT Vmean:        75.400 cm/s LVOT VTI:          0.217 m LVOT/AV VTI  ratio: 0.64  AORTA Ao Root diam: 3.20 cm Ao Asc diam:  2.80 cm MITRAL VALVE               TRICUSPID VALVE MV Area (PHT): 5.66 cm    TR Peak grad:   33.2 mmHg MV Area VTI:   2.53 cm    TR Vmax:        288.00 cm/s MV Peak grad:  8.0 mmHg MV Mean grad:  2.0 mmHg    SHUNTS MV Vmax:       1.41 m/s    Systemic VTI:  0.22 m MV Vmean:      60.2 cm/s   Systemic Diam: 2.00 cm MV Decel Time: 134 msec MV E velocity: 1.45 cm/s MV A velocity: 65.30 cm/s MV E/A ratio:  0.02 Fransico Him MD Electronically signed by Fransico Him MD Signature Date/Time: 11/26/2020/12:32:16 PM    Final     Orson Eva, DO  [facility]  If 7PM-7AM, please contact night-coverage [email or web address] Password [email or web address] 11/30/2020, 3:13 PM   LOS: 5 days

## 2020-12-01 ENCOUNTER — Inpatient Hospital Stay (HOSPITAL_COMMUNITY): Payer: Medicaid Other

## 2020-12-01 DIAGNOSIS — Z6841 Body Mass Index (BMI) 40.0 and over, adult: Secondary | ICD-10-CM

## 2020-12-01 DIAGNOSIS — N186 End stage renal disease: Secondary | ICD-10-CM | POA: Diagnosis not present

## 2020-12-01 DIAGNOSIS — E875 Hyperkalemia: Secondary | ICD-10-CM | POA: Diagnosis not present

## 2020-12-01 DIAGNOSIS — N049 Nephrotic syndrome with unspecified morphologic changes: Secondary | ICD-10-CM

## 2020-12-01 LAB — CBC
HCT: 22.9 % — ABNORMAL LOW (ref 36.0–46.0)
Hemoglobin: 7.3 g/dL — ABNORMAL LOW (ref 12.0–15.0)
MCH: 30 pg (ref 26.0–34.0)
MCHC: 31.9 g/dL (ref 30.0–36.0)
MCV: 94.2 fL (ref 80.0–100.0)
Platelets: 196 10*3/uL (ref 150–400)
RBC: 2.43 MIL/uL — ABNORMAL LOW (ref 3.87–5.11)
RDW: 14.8 % (ref 11.5–15.5)
WBC: 8.5 10*3/uL (ref 4.0–10.5)
nRBC: 0 % (ref 0.0–0.2)

## 2020-12-01 LAB — RENAL FUNCTION PANEL
Albumin: 2.1 g/dL — ABNORMAL LOW (ref 3.5–5.0)
Anion gap: 8 (ref 5–15)
BUN: 69 mg/dL — ABNORMAL HIGH (ref 6–20)
CO2: 21 mmol/L — ABNORMAL LOW (ref 22–32)
Calcium: 6.8 mg/dL — ABNORMAL LOW (ref 8.9–10.3)
Chloride: 106 mmol/L (ref 98–111)
Creatinine, Ser: 6.56 mg/dL — ABNORMAL HIGH (ref 0.44–1.00)
GFR, Estimated: 8 mL/min — ABNORMAL LOW (ref >60)
Glucose, Bld: 117 mg/dL — ABNORMAL HIGH (ref 70–99)
Phosphorus: 7 mg/dL — ABNORMAL HIGH (ref 2.5–4.6)
Potassium: 4.7 mmol/L (ref 3.5–5.1)
Sodium: 135 mmol/L (ref 135–145)

## 2020-12-01 LAB — URIC ACID: Uric Acid, Serum: 8.5 mg/dL — ABNORMAL HIGH (ref 2.5–7.1)

## 2020-12-01 LAB — GLUCOSE, CAPILLARY
Glucose-Capillary: 98 mg/dL (ref 70–99)
Glucose-Capillary: 118 mg/dL — ABNORMAL HIGH (ref 70–99)
Glucose-Capillary: 142 mg/dL — ABNORMAL HIGH (ref 70–99)
Glucose-Capillary: 171 mg/dL — ABNORMAL HIGH (ref 70–99)

## 2020-12-01 LAB — HEPATITIS B SURFACE ANTIBODY, QUANTITATIVE: Hep B S AB Quant (Post): 5.8 m[IU]/mL — ABNORMAL LOW (ref >9.9)

## 2020-12-01 MED ORDER — HEPARIN SODIUM (PORCINE) 1000 UNIT/ML IJ SOLN
INTRAMUSCULAR | Status: AC
  Administered 2020-12-01: 3800 [IU]
  Filled 2020-12-01: qty 4

## 2020-12-01 NOTE — TOC Progression Note (Signed)
Transition of Care [facility]) - Progression Note    Patient Details  Name: Sheila Austin MRN: [id number] Date of Birth: 06-Feb-1986  [facility]) CM/SW Contact  Boneta Lucks, RN Phone Number: 12/01/2020, 12:26 PM  Clinical Narrative:   [facility] accepted, Patient is agreeable, Debbie started INS AUTH. TOC updated [facility] with DC plan for Saturday.   Expected Discharge Plan: Skilled Nursing Facility Barriers to Discharge: Continued Medical Work up  Expected Discharge Plan and Services Expected Discharge Plan: [facility]      Readmission Risk Interventions Readmission Risk Prevention Plan 11/30/2020 11/28/2020  Transportation Screening Complete Complete  PCP or Specialist Appt within 3-5 Days - Complete  HRI or [place] - Complete  Social Work Consult for [place] Planning/Counseling - Complete  Palliative Care Screening - Not Applicable  Medication Review [employer]) Complete Complete  HRI or Home Care Consult Complete -  SW Recovery Care/Counseling Consult Complete -  Palliative Care Screening Not Applicable -  Skilled Nursing Facility Complete -  Some recent data might be hidden

## 2020-12-01 NOTE — Progress Notes (Signed)
Subjective:  uop 4.5L, tolerated hd well yesterday, net uf 0.5L. still has itchiness otherwise no new complaints. Objective Vital signs in last 24 hours: Vitals:   11/30/20 1748 11/30/20 2121 12/01/20 0518 12/01/20 0822  BP: (!) 156/83 (!) 141/79 107/68 140/76  Pulse:  81 74 75  Resp:  18 18   Temp:  98.3 F (36.8 C) 98.4 F (36.9 C)   TempSrc:  Oral Oral   SpO2:  97% 96%   Weight:   (!) 164.3 kg   Height:       Weight change: 0.1 kg  Intake/Output Summary (Last 24 hours) at 12/01/2020 1024 Last data filed at 12/01/2020 0900 Gross per 24 hour  Intake 360 ml  Output 5300 ml  Net -4940 ml    Assessment/Plan: 35 year old WF with advanced CKD in the setting of poorly controlled DM and HTN, poor social situation not allowing appropriate follow up-  now with massive volume overload-  anemia , metabolic acidosis and hyperkalemia 1.Renal- underlying CKD 5-  progressive for a while.  Now ESRD. S/p [facility] placement 7/26 with Dr. Donnetta Hutching, vein mapping to be done for permanent access. Start HD 7/27, slow start protocol. Transport to OP dialysis might be an issue given her mobility issues but can get SW started on the arrangements--CLIP. Continue with diuresis for now until we can achieve adequate UF with HD. Stopped lokelma 2. Hypertension/volume  - hypertensive mostly due to volume, have been able to achieve diuresis-  have stopped norvasc-  max medical diuresis-  BP stable 3. 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 Dispo-  pending SNF and outpatient HD chair   Saray Capasso    Labs: Basic Metabolic Panel: Recent Labs  Lab 11/29/20 0432 11/30/20 0434 12/01/20 0333  NA 135 135 135  K 5.2* 5.0 4.7  CL 110 108 106  CO2 15* 17* 21*  GLUCOSE 97 81 117*  BUN 76* 78* 69*  CREATININE 6.86* 6.87* 6.56*  CALCIUM 6.5* 6.6* 6.8*  PHOS 7.9* 7.6* 7.0*   Liver Function Tests: Recent Labs  Lab  11/25/20 1801 11/26/20 0806 11/27/20 0559 11/29/20 0432 11/30/20 0434 12/01/20 0333  AST 15 10*  --   --   --   --   ALT 9 10  --   --   --   --   ALKPHOS 189* 171*  --   --   --   --   BILITOT 0.6 0.7  --   --   --   --   PROT 6.1* 5.7*  --   --   --   --   ALBUMIN 2.3* 2.1*   < > 2.1* 2.0* 2.1*   < > = values in this interval not displayed.   No results for input(s): LIPASE, AMYLASE in the last 168 hours. No results for input(s): AMMONIA in the last 168 hours. CBC: Recent Labs  Lab 11/25/20 1801 11/26/20 0806 11/30/20 0434  WBC 9.6 7.6 8.3  HGB 7.6* 7.3* 7.0*  HCT 24.9* 24.3* 22.8*  MCV 95.8 96.4 95.8  PLT 204 179 169   Cardiac Enzymes: No results for input(s): CKTOTAL, CKMB, CKMBINDEX, TROPONINI in the last 168 hours. CBG: Recent Labs  Lab 11/30/20 0742 11/30/20 1119 11/30/20 1700 11/30/20 2125 12/01/20 0713  GLUCAP 76 121* 89 114* 98    Iron Studies:  No results for input(s): IRON, TIBC, TRANSFERRIN, FERRITIN in the last 72 hours.  Studies/Results: DG Chest Port 1 View  Result Date: 11/29/2020 CLINICAL DATA:  Status post dialysis catheter placement. EXAM: PORTABLE CHEST 1 VIEW COMPARISON:  11/26/2020 FINDINGS: Stable cardiac enlargement. Interval placement of right jugular tunneled dialysis catheter with catheter tip overlying the right atrium. No pneumothorax. Slight decrease in pulmonary vascular congestion. No pleural effusions. IMPRESSION: Dialysis catheter tip overlies the right atrium. No pneumothorax. Slight decrease in pulmonary vascular congestion. Electronically Signed   By: Aletta Edouard M.D.   On: 11/29/2020 16:17   DG C-Arm 1-60 Min-No Report  Result Date: 11/29/2020 Fluoroscopy was utilized by the requesting physician.  No radiographic interpretation.   Medications: Infusions:  sodium chloride     sodium chloride     ferric gluconate (FERRLECIT) IVPB 250 mg (11/30/20 1500)   furosemide 160 mg (12/01/20 0834)    Scheduled Medications:   calcium acetate  1,334 mg Oral TID WC   carvedilol  12.5 mg Oral BID   Chlorhexidine Gluconate Cloth  6 each Topical Q0600   darbepoetin (ARANESP) injection - NON-DIALYSIS  200 mcg Subcutaneous Q Mon-1800   hydrALAZINE  50 mg Oral TID   insulin aspart  0-5 Units Subcutaneous QHS   insulin aspart  0-6 Units Subcutaneous TID WC   insulin glargine-yfgn  4 Units Subcutaneous QHS   isosorbide mononitrate  30 mg Oral Daily   levETIRAcetam  500 mg Oral BID   metolazone  10 mg Oral Daily   mupirocin ointment  1 application Nasal BID   sodium bicarbonate  650 mg Oral TID   tamsulosin  0.4 mg Oral QPC supper    have reviewed scheduled and prn medications.  Physical Exam: General:  alert, pleasant- NAD Heart: RRR Lungs: cta bl Abdomen: obese, soft non tender Extremities: pitting but also chronic woody edema present Dialysis Access: RIJ TDC c/d/i   12/01/2020,10:24 AM  LOS: 6 days

## 2020-12-01 NOTE — Procedures (Signed)
   HEMODIALYSIS TREATMENT NOTE (HD#2):  Uneventful 2 hour heparin-free treatment completed.  Hemodynamically stable.  Catheter tolerated prescribed flows.  1 liter removed without interruption in UF.  All blood was returned.  Rockwell Alexandria, RN

## 2020-12-01 NOTE — Progress Notes (Addendum)
PROGRESS NOTE  Sheila Austin [id number] DOB: Feb 16, 1986 DOA: 11/25/2020 PCP: Pcp, No   Brief History:  As per H&P written by Dr. Clearence Ped on 11/26/20 35 y.o. female,with hx of CHF, DMII, HTN, GERD, morbid obesity, and chronic kidney disease presents to the ED with a chief complaint of peripheral edema.  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  She reports that she has a bariatric wheelchair at home, but she and her fianc live in a mobile home.  There are parts of the home, including the bathroom, where the bariatric wheelchair cannot access.  Patient usually crawls to the bathroom.  She reports that she has had increased swelling and peripheral edema, but she really started to notice it today, because she could not bend her knee to crawl on the floor.  She reports she can even get off the couch.  She also notes that she has had a decrease in urine output.  The symptoms have been associated with dyspnea on exertion, orthopnea, wheeze, and palpitations that last minutes.  Her symptoms are worse with exertion, better with rest.  She reports no cough.  She does have 2 L nasal cannula prescribed at home to wear 24/7.  She says she wears it at night and then she wears it when she needs it.  She has not been wearing it today.  Patient denies any fevers.  She has not had any dysuria or hematuria.  Patient is not on dialysis but her nephrologist in [place] has discussed this with her.  Dialysis discussion started in May 2022 and patient has been lost to follow-up since then as she does not have transport to get to her nephrologist in [place].    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 -appreciate nephrology -continue IV Lasix and metolazone--neg 14L -vascular surgery consulted and placed [facility] 11/29/20. -continue sodium  bicarbonate -d/c daily Lokelma now that patient is on HD -first HD 7/27 -plan HD 7/28 and 7/29 -Continue to follow electrolytes trend.   hypertension -In the setting of fluid overload most likely -Continue max dose of IV diuretics -Continue coreg, hydralazine, imdur -anticipate improvement with HD -Norvasc has been discontinued. -Low-sodium/renal diet discussed with patient.   type 2 diabetes with nephropathy -Continue sliding scale insulin and follow CBGs fluctuation. -09/24/20 A1C--5.9   anemia of chronic kidney disease -iron saturation 16, ferritin 164 -IV iron therapy and Epogen as per nephrology discretion. -Last hemoglobin check 7.3; will repeat CBC and follow hemoglobin trend. -No overt bleeding appreciated..   secondary hyperparathyroidism/hypocalcemia -Calcium gluconate given on 11/26/2020; repeat calcium today 6.4 but corrected for her hypoalbuminemia 8.0. -Continue monitoring electrolytes trend  -PhosLo has been added -Follow electrolytes trend.   super morbid obesity -Body mass index is 69.72 kg/m. -Lifestyle modification  S/p R-BKA -PT eval           Status is: Inpatient   Remains inpatient appropriate because:Persistent severe electrolyte disturbances   Dispo: The patient is from: Home              Anticipated d/c is to: SNF              Patient currently is not medically stable to d/c.              Difficult to place patient No  Family Communication:   no Family at bedside   Consultants:  renal, vascular surgery   Code Status:  FULL   DVT Prophylaxis:  [place] Heparin     Procedures: As Listed in Progress Note Above   Antibiotics: None     Subjective: Patient denies fevers, chills, headache, chest pain, dyspnea, nausea, vomiting, diarrhea, abdominal pain, dysuria, hematuria, hematochezia, and melena.   Objective: Vitals:   11/30/20 2121 12/01/20 0518 12/01/20 0822 12/01/20 1315  BP: (!) 141/79 107/68 140/76 (!)  142/76  Pulse: 81 74 75 81  Resp: '18 18  16  '$ Temp: 98.3 F (36.8 C) 98.4 F (36.9 C)  98.8 F (37.1 C)  TempSrc: Oral Oral  Oral  SpO2: 97% 96%  97%  Weight:  (!) 164.3 kg  (!) 166.9 kg  Height:        Intake/Output Summary (Last 24 hours) at 12/01/2020 1645 Last data filed at 12/01/2020 1300 Gross per 24 hour  Intake 540 ml  Output 4600 ml  Net -4060 ml   Weight change: 0.1 kg Exam:  General:  Pt is alert, follows commands appropriately, not in acute distress HEENT: No icterus, No thrush, No neck mass, [place]/AT Cardiovascular: RRR, S1/S2, no rubs, no gallops Respiratory: bibasilar rales. No wheeze Abdomen: Soft/+BS, non tender, non distended, no guarding Extremities: 3+ LLE edema, No lymphangitis, No petechiae, No rashes, no synovitis; R-BKA   Data Reviewed: I have personally reviewed following labs and imaging studies Basic Metabolic Panel: Recent Labs  Lab 11/25/20 1801 11/25/20 1801 11/26/20 0806 11/27/20 0559 11/28/20 0549 11/29/20 0432 11/30/20 0434 12/01/20 0333  NA 135  --  137 136 135 135 135 135  K 5.8*  --  5.7* 5.5* 5.6* 5.2* 5.0 4.7  CL 115*  --  114* 112* 110 110 108 106  CO2 14*  --  15* 16* 18* 15* 17* 21*  GLUCOSE 150*  --  104* 99 96 97 81 117*  BUN 70*  --  68* 71* 75* 76* 78* 69*  CREATININE 6.83*  --  6.54* 6.36* 6.76* 6.86* 6.87* 6.56*  CALCIUM 6.0*  --  6.2* 6.4* 6.4* 6.5* 6.6* 6.8*  MG 1.6*  --  1.5*  --   --   --   --   --   PHOS  --    < > 7.6* 7.6* 7.7* 7.9* 7.6* 7.0*   < > = values in this interval not displayed.   Liver Function Tests: Recent Labs  Lab 11/25/20 1801 11/26/20 0806 11/27/20 0559 11/28/20 0549 11/29/20 0432 11/30/20 0434 12/01/20 0333  AST 15 10*  --   --   --   --   --   ALT 9 10  --   --   --   --   --   ALKPHOS 189* 171*  --   --   --   --   --   BILITOT 0.6 0.7  --   --   --   --   --   PROT 6.1* 5.7*  --   --   --   --   --   ALBUMIN 2.3* 2.1* 2.0* 2.0* 2.1* 2.0* 2.1*   No results for input(s): LIPASE,  AMYLASE in the last 168 hours. No results for input(s): AMMONIA in the last 168 hours. Coagulation Profile: No results for input(s): INR, PROTIME in the last 168 hours. CBC: Recent Labs  Lab 11/25/20 1801 11/26/20 0806 11/30/20 0434  WBC 9.6 7.6 8.3  HGB 7.6* 7.3* 7.0*  HCT 24.9* 24.3* 22.8*  MCV 95.8 96.4 95.8  PLT 204 179 169   Cardiac Enzymes: No results for input(s): CKTOTAL, CKMB, CKMBINDEX, TROPONINI in the last 168 hours. BNP: Invalid input(s): POCBNP CBG: Recent Labs  Lab 11/30/20 1700 11/30/20 2125 12/01/20 0713 12/01/20 1112 12/01/20 1633  GLUCAP 89 114* 98 118* 142*   HbA1C: No results for input(s): HGBA1C in the last 72 hours. Urine analysis:    Component Value Date/Time   COLORURINE YELLOW 11/25/2020 2037   APPEARANCEUR CLEAR 11/25/2020 2037   LABSPEC 1.011 11/25/2020 2037   PHURINE 6.0 11/25/2020 2037   GLUCOSEU 150 (A) 11/25/2020 2037   HGBUR SMALL (A) 11/25/2020 2037   BILIRUBINUR NEGATIVE 11/25/2020 2037   BILIRUBINUR negative 08/13/2017 1622   KETONESUR NEGATIVE 11/25/2020 2037   PROTEINUR >=300 (A) 11/25/2020 2037   UROBILINOGEN 0.2 08/13/2017 1622   UROBILINOGEN 0.2 11/25/2014 0553   NITRITE NEGATIVE 11/25/2020 2037   LEUKOCYTESUR NEGATIVE 11/25/2020 2037   Sepsis Labs: '@LABRCNTIP'$ (procalcitonin:4,lacticidven:4) ) Recent Results (from the past 240 hour(s))  SARS CORONAVIRUS 2 (Laycee Fitzsimmons 6-24 HRS) Nasopharyngeal Nasopharyngeal Swab     Status: None   Collection Time: 11/25/20  8:00 PM   Specimen: Nasopharyngeal Swab  Result Value Ref Range Status   SARS Coronavirus 2 NEGATIVE NEGATIVE Final    Comment: (NOTE) SARS-CoV-2 target nucleic acids are NOT DETECTED.  The SARS-CoV-2 RNA is generally detectable in upper and lower respiratory specimens during the acute phase of infection. Negative results do not preclude SARS-CoV-2 infection, do not rule out co-infections with other pathogens, and should not be used as the sole basis for treatment or  other patient management decisions. Negative results must be combined with clinical observations, patient history, and epidemiological information. The expected result is Negative.  Fact Sheet for Patients: SugarRoll.be  Fact Sheet for Healthcare Providers: https://www.woods-mathews.com/  This test is not yet approved or cleared by the [place] FDA and  has been authorized for detection and/or diagnosis of SARS-CoV-2 by FDA under an Emergency Use Authorization (EUA). This EUA will remain  in effect (meaning this test can be used) for the duration of the COVID-19 declaration under Se ction 564(b)(1) of the Act, 21 U.S.C. section 360bbb-3(b)(1), unless the authorization is terminated or revoked sooner.  Performed at [facility], [place] [address], [place], [place] [address]   Surgical pcr screen     Status: Abnormal   Collection Time: 11/28/20 10:11 PM   Specimen: Nasal Mucosa; Nasal Swab  Result Value Ref Range Status   MRSA, PCR POSITIVE (A) NEGATIVE Final    Comment: RESULT CALLED TO, READ BACK BY AND VERIFIED WITH: WATLINGTON,J'@0307'$  BY MATTHEWS, B 7.26.22    Staphylococcus aureus POSITIVE (A) NEGATIVE Final    Comment: RESULT CALLED TO, READ BACK BY AND VERIFIED WITH: WATLINGTON,J'@0307'$  BY MATTHEWS, B 7.26.22 (NOTE) The Xpert SA Assay (FDA approved for NASAL specimens in patients 60 years of age and older), is one component of a comprehensive surveillance program. It is not intended to diagnose infection nor to guide or monitor treatment. Performed at [facility], [address] [place], [place] [address]      Scheduled Meds:  calcium acetate  1,334 mg Oral TID WC   carvedilol  12.5 mg Oral BID   Chlorhexidine Gluconate Cloth  6 each Topical Q0600   darbepoetin (ARANESP) injection - NON-DIALYSIS  200 mcg Subcutaneous Q Mon-1800   hydrALAZINE  50 mg Oral TID   insulin aspart  0-5 Units Subcutaneous QHS   insulin aspart   0-6 Units Subcutaneous TID WC   insulin glargine-yfgn  4 Units Subcutaneous QHS   isosorbide mononitrate  30 mg Oral Daily   levETIRAcetam  500 mg Oral BID   metolazone  10 mg Oral Daily   mupirocin ointment  1 application Nasal BID   sodium bicarbonate  650 mg Oral TID   tamsulosin  0.4 mg Oral QPC supper   Continuous Infusions:  sodium chloride     sodium chloride     furosemide 160 mg (12/01/20 0834)    Procedures/Studies: DG Chest Port 1 View  Result Date: 11/29/2020 CLINICAL DATA:  Status post dialysis catheter placement. EXAM: PORTABLE CHEST 1 VIEW COMPARISON:  11/26/2020 FINDINGS: Stable cardiac enlargement. Interval placement of right jugular tunneled dialysis catheter with catheter tip overlying the right atrium. No pneumothorax. Slight decrease in pulmonary vascular congestion. No pleural effusions. IMPRESSION: Dialysis catheter tip overlies the right atrium. No pneumothorax. Slight decrease in pulmonary vascular congestion. Electronically Signed   By: Aletta Edouard M.D.   On: 11/29/2020 16:17   Portable chest 1 View  Result Date: 11/26/2020 CLINICAL DATA:  Pleural effusion. EXAM: PORTABLE CHEST 1 VIEW COMPARISON:  November 25, 2020. FINDINGS: Stable cardiomediastinal silhouette. Mild central pulmonary vascular congestion may be present. Right basilar opacity is noted concerning for possible atelectasis and effusion. Bony thorax is unremarkable. IMPRESSION: Mild central pulmonary vascular congestion. Right basilar opacity is noted concerning for possible atelectasis and pleural effusion. Electronically Signed   By: Marijo Conception M.D.   On: 11/26/2020 10:10   DG Chest Portable 1 View  Result Date: 11/25/2020 CLINICAL DATA:  Edema. The patient states generalized weakness and swelling to lower limbs in the last 4 days. States SOB on exertion/ slight movement. Hx of diabetes, pulmonary edema, CHF, and HTN. Not being tested for covid-19 EXAM: PORTABLE CHEST 1 VIEW.  Patient is  rotated. COMPARISON:  CT abdomen pelvis 10/20/2020 FINDINGS: Enlarged cardiac silhouette. The heart size and mediastinal contours are otherwise unchanged. Again noted is slightly more lucent left hemithorax compared to the right. No focal consolidation. Mild pulmonary edema. No pneumothorax. No acute osseous abnormality. IMPRESSION: 1. Bilateral pleural effusions that are difficult to evaluate on this AP semi upright view. Given opacity overlying the right hemithorax, there is a likely larger than trace right pleural effusion. Recommend PA and lateral view of the chest for further evaluation given size of effusions on CT chest 10/20/20. 2. Persistent enlargement of the cardiac silhouette. Underlying pericardial effusion not excluded. 3. Mild pulmonary edema. Electronically Signed   By: Iven Finn M.D.   On: 11/25/2020 19:11   DG C-Arm 1-60 Min-No Report  Result Date: 11/29/2020 Fluoroscopy was utilized by the requesting physician.  No radiographic interpretation.   ECHOCARDIOGRAM COMPLETE  Result Date: 11/26/2020    ECHOCARDIOGRAM REPORT   Patient Name:   KAYLONIE BLANDFORD Date of Exam: 11/26/2020 Medical Rec #:  [id number]        Height:       62.0 in Accession #:    [id number]       Weight:       382.7 lb Date of Birth:  [birth year]        BSA:          2.519 m Patient Age:    34 years         BP:           181/96 mmHg Patient Gender: F  HR:           80 bpm. Exam Location:  [facility] Procedure: 2D Echo, Cardiac Doppler and Color Doppler Indications:    CHF  History:        Patient has prior history of Echocardiogram examinations, most                 recent 09/24/2020. CHF; Risk Factors:Hypertension, Diabetes and                 Dyslipidemia.  Sonographer:    Wenda Low Referring Phys: [id number] ASIA B [place]  1. Left ventricular ejection fraction, by estimation, is 60 to 65%. The left ventricle has normal function. The left ventricle has no regional wall motion  abnormalities. There is mild concentric left ventricular hypertrophy. Left ventricular diastolic parameters are indeterminate.  2. Prominent moderator band present. Right ventricular systolic function is severely reduced. The right ventricular size is severely enlarged. There is mildly elevated pulmonary artery systolic pressure.  3. A small pericardial effusion is present. The pericardial effusion is circumferential.  4. The mitral valve is normal in structure. Trivial mitral valve regurgitation. No evidence of mitral stenosis.  5. Tricuspid valve regurgitation is mild to moderate.  6. The aortic valve is normal in structure. Aortic valve regurgitation is not visualized. No aortic stenosis is present.  7. The inferior vena cava is normal in size with greater than 50% respiratory variability, suggesting right atrial pressure of 3 mmHg. FINDINGS  Left Ventricle: Left ventricular ejection fraction, by estimation, is 60 to 65%. The left ventricle has normal function. The left ventricle has no regional wall motion abnormalities. The left ventricular internal cavity size was normal in size. There is  mild concentric left ventricular hypertrophy. Left ventricular diastolic parameters are indeterminate. Normal left ventricular filling pressure. Right Ventricle: Prominent moderator band present. The right ventricular size is severely enlarged. No increase in right ventricular wall thickness. Right ventricular systolic function is severely reduced. There is mildly elevated pulmonary artery systolic pressure. The tricuspid regurgitant velocity is 2.88 m/s, and with an assumed right atrial pressure of 8 mmHg, the estimated right ventricular systolic pressure is [id number] mmHg. Left Atrium: Left atrial size was normal in size. Right Atrium: Right atrial size was normal in size. Pericardium: A small pericardial effusion is present. The pericardial effusion is circumferential. Mitral Valve: The mitral valve is normal in structure.  There is mild thickening of the mitral valve leaflet(s). Trivial mitral valve regurgitation. No evidence of mitral valve stenosis. MV peak gradient, 8.0 mmHg. The mean mitral valve gradient is 2.0 mmHg. Tricuspid Valve: The tricuspid valve is normal in structure. Tricuspid valve regurgitation is mild to moderate. No evidence of tricuspid stenosis. Aortic Valve: The aortic valve is normal in structure. Aortic valve regurgitation is not visualized. No aortic stenosis is present. Aortic valve mean gradient measures 6.0 mmHg. Aortic valve peak gradient measures 8.8 mmHg. Aortic valve area, by VTI measures 2.01 cm. Pulmonic Valve: The pulmonic valve was normal in structure. Pulmonic valve regurgitation is trivial. No evidence of pulmonic stenosis. Aorta: The aortic root is normal in size and structure. Venous: The inferior vena cava is normal in size with greater than 50% respiratory variability, suggesting right atrial pressure of 3 mmHg. IAS/Shunts: No atrial level shunt detected by color flow Doppler.  LEFT VENTRICLE PLAX 2D LVIDd:         4.71 cm  Diastology LVIDs:         3.45 cm  LV  e' medial:    0.13 cm/s LV PW:         1.26 cm  LV E/e' medial:  11.1 LV IVS:        1.24 cm  LV e' lateral:   0.10 cm/s LVOT diam:     2.00 cm  LV E/e' lateral: 14.5 LV SV:         68 LV SV Index:   27 LVOT Area:     3.14 cm  RIGHT VENTRICLE RV Basal diam:  4.05 cm RV Mid diam:    4.65 cm LEFT ATRIUM             Index       RIGHT ATRIUM           Index LA diam:        4.50 cm 1.79 cm/m  RA Area:     18.50 cm LA Vol (A2C):   37.9 ml 15.05 ml/m RA Volume:   49.20 ml  19.53 ml/m LA Vol (A4C):   52.5 ml 20.84 ml/m LA Biplane Vol: 46.5 ml 18.46 ml/m  AORTIC VALVE AV Area (Vmax):    2.29 cm AV Area (Vmean):   1.99 cm AV Area (VTI):     2.01 cm AV Vmax:           148.00 cm/s AV Vmean:          119.000 cm/s AV VTI:            0.339 m AV Peak Grad:      8.8 mmHg AV Mean Grad:      6.0 mmHg LVOT Vmax:         108.00 cm/s LVOT Vmean:         75.400 cm/s LVOT VTI:          0.217 m LVOT/AV VTI ratio: 0.64  AORTA Ao Root diam: 3.20 cm Ao Asc diam:  2.80 cm MITRAL VALVE               TRICUSPID VALVE MV Area (PHT): 5.66 cm    TR Peak grad:   33.2 mmHg MV Area VTI:   2.53 cm    TR Vmax:        288.00 cm/s MV Peak grad:  8.0 mmHg MV Mean grad:  2.0 mmHg    SHUNTS MV Vmax:       1.41 m/s    Systemic VTI:  0.22 m MV Vmean:      60.2 cm/s   Systemic Diam: 2.00 cm MV Decel Time: 134 msec MV E velocity: 1.45 cm/s MV A velocity: 65.30 cm/s MV E/A ratio:  0.02 Fransico Him MD Electronically signed by Fransico Him MD Signature Date/Time: 11/26/2020/12:32:16 PM    Final     Orson Eva, DO  [facility]  If 7PM-7AM, please contact night-coverage [email or web address] Password [email or web address] 12/01/2020, 4:45 PM   LOS: 6 days

## 2020-12-02 ENCOUNTER — Inpatient Hospital Stay (HOSPITAL_COMMUNITY): Payer: Medicaid Other

## 2020-12-02 DIAGNOSIS — N185 Chronic kidney disease, stage 5: Secondary | ICD-10-CM | POA: Diagnosis not present

## 2020-12-02 DIAGNOSIS — D631 Anemia in chronic kidney disease: Secondary | ICD-10-CM | POA: Diagnosis not present

## 2020-12-02 DIAGNOSIS — N049 Nephrotic syndrome with unspecified morphologic changes: Secondary | ICD-10-CM | POA: Diagnosis not present

## 2020-12-02 DIAGNOSIS — N186 End stage renal disease: Secondary | ICD-10-CM | POA: Diagnosis not present

## 2020-12-02 LAB — RENAL FUNCTION PANEL
Albumin: 2.2 g/dL — ABNORMAL LOW (ref 3.5–5.0)
Anion gap: 11 (ref 5–15)
BUN: 59 mg/dL — ABNORMAL HIGH (ref 6–20)
CO2: 20 mmol/L — ABNORMAL LOW (ref 22–32)
Calcium: 7 mg/dL — ABNORMAL LOW (ref 8.9–10.3)
Chloride: 105 mmol/L (ref 98–111)
Creatinine, Ser: 5.52 mg/dL — ABNORMAL HIGH (ref 0.44–1.00)
GFR, Estimated: 10 mL/min — ABNORMAL LOW (ref >60)
Glucose, Bld: 137 mg/dL — ABNORMAL HIGH (ref 70–99)
Phosphorus: 5.4 mg/dL — ABNORMAL HIGH (ref 2.5–4.6)
Potassium: 4.1 mmol/L (ref 3.5–5.1)
Sodium: 136 mmol/L (ref 135–145)

## 2020-12-02 LAB — GLUCOSE, CAPILLARY
Glucose-Capillary: 135 mg/dL — ABNORMAL HIGH (ref 70–99)
Glucose-Capillary: 156 mg/dL — ABNORMAL HIGH (ref 70–99)
Glucose-Capillary: 129 mg/dL — ABNORMAL HIGH (ref 70–99)
Glucose-Capillary: 161 mg/dL — ABNORMAL HIGH (ref 70–99)

## 2020-12-02 LAB — HEMOGLOBIN A1C
Hgb A1c MFr Bld: 6.1 % — ABNORMAL HIGH (ref 4.8–5.6)
Mean Plasma Glucose: 128 mg/dL

## 2020-12-02 MED ORDER — CARVEDILOL 12.5 MG PO TABS
25.0000 mg | ORAL_TABLET | Freq: Two times a day (BID) | ORAL | Status: DC
  Administered 2020-12-02 – 2020-12-07 (×10): 25 mg via ORAL
  Filled 2020-12-02 (×10): qty 2

## 2020-12-02 MED ORDER — HEPARIN SODIUM (PORCINE) 1000 UNIT/ML IJ SOLN
INTRAMUSCULAR | Status: AC
  Administered 2020-12-02: 3800 [IU]
  Filled 2020-12-02: qty 1

## 2020-12-02 MED ORDER — POLYETHYLENE GLYCOL 3350 17 G PO PACK
17.0000 g | PACK | Freq: Every day | ORAL | Status: DC
  Filled 2020-12-02 (×6): qty 1

## 2020-12-02 MED ORDER — DIPHENHYDRAMINE HCL 25 MG PO CAPS
25.0000 mg | ORAL_CAPSULE | Freq: Once | ORAL | Status: AC
  Administered 2020-12-02: 25 mg via ORAL
  Filled 2020-12-02: qty 1

## 2020-12-02 MED ORDER — SENNA 8.6 MG PO TABS
2.0000 | ORAL_TABLET | Freq: Every day | ORAL | Status: DC
  Administered 2020-12-02 – 2020-12-07 (×6): 17.2 mg via ORAL
  Filled 2020-12-02 (×6): qty 2

## 2020-12-02 NOTE — Procedures (Signed)
   HEMODIALYSIS TREATMENT NOTE (HD#3):  Uneventful 3 hour heparin-free HD completed via RIJ TDC.  Goal met: 2 liters removed without interruption in ultrafiltration.  Hemodynamically stable.  All blood was returned.  No changes from pre-HD assessment.  Rockwell Alexandria, RN

## 2020-12-02 NOTE — Progress Notes (Addendum)
Subjective:  tolerated hd well yesterday, net UF 1L. She reports that the itchiness is still there but mostly in dependent areas/areas of sweating. She reports that her swelling is better, able to roll around in bed now. Objective Vital signs in last 24 hours: Vitals:   12/01/20 2244 12/02/20 0500 12/02/20 0519 12/02/20 0811  BP: (!) 165/83  127/69 (!) 161/88  Pulse: 89  84 85  Resp: 18     Temp: 98.2 F (36.8 C)  98.2 F (36.8 C)   TempSrc: Oral  Oral   SpO2: 97%  92%   Weight:  (!) 161 kg    Height:       Weight change: -0.4 kg  Intake/Output Summary (Last 24 hours) at 12/02/2020 1022 Last data filed at 12/02/2020 0900 Gross per 24 hour  Intake 720 ml  Output 4650 ml  Net -3930 ml    Assessment/Plan: 35 year old WF with advanced CKD in the setting of poorly controlled DM and HTN, poor social situation not allowing appropriate follow up-  now with massive volume overload-  anemia , metabolic acidosis and hyperkalemia 1.Renal- underlying CKD 5-  progressive for a while.  Now ESRD. S/p [facility] placement 7/26 with Dr. Donnetta Hutching, vein mapping to be done for permanent access. Start HD 7/27, slow start protocol. Transport to OP dialysis might be an issue given her mobility issues but can get SW started on the arrangements--CLIP. Stopped lokelma -HD#3 today, next treatment planned for Monday, once steady on HD can change diuretics to off-HD days 2. Hypertension/volume  - hypertensive mostly due to volume, have been able to achieve diuresis-  have stopped norvasc-  max medical diuresis-  BP stable 3. 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 Dispo-  pending SNF and outpatient HD chair placement  Will likely not be seen over the weekend, please call with questions/concerns.  Sheila Austin    Labs: Basic Metabolic Panel: Recent Labs  Lab 11/30/20 0434 12/01/20 0333 12/02/20 0540  NA 135 135 136   K 5.0 4.7 4.1  CL 108 106 105  CO2 17* 21* 20*  GLUCOSE 81 117* 137*  BUN 78* 69* 59*  CREATININE 6.87* 6.56* 5.52*  CALCIUM 6.6* 6.8* 7.0*  PHOS 7.6* 7.0* 5.4*   Liver Function Tests: Recent Labs  Lab 11/25/20 1801 11/26/20 0806 11/27/20 0559 11/30/20 0434 12/01/20 0333 12/02/20 0540  AST 15 10*  --   --   --   --   ALT 9 10  --   --   --   --   ALKPHOS 189* 171*  --   --   --   --   BILITOT 0.6 0.7  --   --   --   --   PROT 6.1* 5.7*  --   --   --   --   ALBUMIN 2.3* 2.1*   < > 2.0* 2.1* 2.2*   < > = values in this interval not displayed.   No results for input(s): LIPASE, AMYLASE in the last 168 hours. No results for input(s): AMMONIA in the last 168 hours. CBC: Recent Labs  Lab 11/25/20 1801 11/26/20 0806 11/30/20 0434 12/01/20 2010  WBC 9.6 7.6 8.3 8.5  HGB 7.6* 7.3* 7.0* 7.3*  HCT 24.9* 24.3* 22.8* 22.9*  MCV 95.8 96.4 95.8 94.2  PLT 204 179 169 196  Cardiac Enzymes: No results for input(s): CKTOTAL, CKMB, CKMBINDEX, TROPONINI in the last 168 hours. CBG: Recent Labs  Lab 12/01/20 0713 12/01/20 1112 12/01/20 1633 12/01/20 2241 12/02/20 0708  GLUCAP 98 118* 142* 171* 135*    Iron Studies:  No results for input(s): IRON, TIBC, TRANSFERRIN, FERRITIN in the last 72 hours.  Studies/Results: DG Wrist Complete Left  Result Date: 12/01/2020 CLINICAL DATA:  34 year old female with left wrist pain. EXAM: LEFT WRIST - COMPLETE 3+ VIEW COMPARISON:  None. FINDINGS: There is no acute fracture or dislocation. The bones are well mineralized. No arthritic changes. There is diffuse subcutaneous edema and soft tissue swelling. Vascular calcifications noted. IMPRESSION: 1. No acute fracture or dislocation. 2. Diffuse subcutaneous edema and vascular calcification. Electronically Signed   By: Anner Crete M.D.   On: 12/01/2020 20:21   Medications: Infusions:  sodium chloride     sodium chloride     furosemide 160 mg (12/02/20 0840)    Scheduled Medications:   calcium acetate  1,334 mg Oral TID WC   carvedilol  12.5 mg Oral BID   Chlorhexidine Gluconate Cloth  6 each Topical Q0600   darbepoetin (ARANESP) injection - NON-DIALYSIS  200 mcg Subcutaneous Q Mon-1800   hydrALAZINE  50 mg Oral TID   insulin aspart  0-5 Units Subcutaneous QHS   insulin aspart  0-6 Units Subcutaneous TID WC   insulin glargine-yfgn  4 Units Subcutaneous QHS   isosorbide mononitrate  30 mg Oral Daily   levETIRAcetam  500 mg Oral BID   metolazone  10 mg Oral Daily   mupirocin ointment  1 application Nasal BID   sodium bicarbonate  650 mg Oral TID   tamsulosin  0.4 mg Oral QPC supper    have reviewed scheduled and prn medications.  Physical Exam: General:  alert, pleasant- NAD Heart: RRR Lungs: cta bl Abdomen: obese, soft non tender Extremities: pitting but also chronic woody edema present Dialysis Access: RIJ TDC c/d/i   12/02/2020,10:22 AM  LOS: 7 days

## 2020-12-02 NOTE — Progress Notes (Signed)
PROGRESS NOTE  Sheila Austin [id number] DOB: 1986-01-06 DOA: 11/25/2020 PCP: Pcp, No   Brief History:  As per H&P written by Dr. Clearence Ped on 11/26/20 35 y.o. female,with hx of CHF, DMII, HTN, GERD, morbid obesity, and chronic kidney disease presents to the ED with a chief complaint of peripheral edema.  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  She reports that she has a bariatric wheelchair at home, but she and her fianc live in a mobile home.  There are parts of the home, including the bathroom, where the bariatric wheelchair cannot access.  Patient usually crawls to the bathroom.  She reports that she has had increased swelling and peripheral edema, but she really started to notice it today, because she could not bend her knee to crawl on the floor.  She reports she can even get off the couch.  She also notes that she has had a decrease in urine output.  The symptoms have been associated with dyspnea on exertion, orthopnea, wheeze, and palpitations that last minutes.  Her symptoms are worse with exertion, better with rest.  She reports no cough.  She does have 2 L nasal cannula prescribed at home to wear 24/7.  She says she wears it at night and then she wears it when she needs it.  She has not been wearing it today.  Patient denies any fevers.  She has not had any dysuria or hematuria.  Patient is not on dialysis but her nephrologist in [place] has discussed this with her.  Dialysis discussion started in May 2022 and patient has been lost to follow-up since then as she does not have transport to get to her nephrologist in [place].    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 -appreciate nephrology -continue IV Lasix and metolazone--neg 14L -vascular surgery consulted and placed [facility] 11/29/20. -continue sodium  bicarbonate -d/c daily Lokelma now that patient is on HD -first HD 7/27 -HD 7/28 and 7/29 -Continue to follow electrolytes trend.   hypertension -In the setting of fluid overload most likely -Continue max dose of IV diuretics -Continue coreg, hydralazine, imdur -anticipate improvement with HD -Norvasc has been discontinued. -Low-sodium/renal diet discussed with patient. -increase coreg to 25 mg bid   type 2 diabetes with nephropathy -Continue sliding scale insulin and follow CBGs fluctuation. -09/24/20 A1C--5.9 -continue lantus  -continue novolog sliding scale  Left wrist pain/edema -xray--no fractures -venous duplex LUE--no DVT -suspect muscle strain from patient using hands/wrists to crawl on floor   anemia of chronic kidney disease -iron saturation 16, ferritin 164 -IV iron therapy and Epogen as per nephrology discretion. -Last hemoglobin check 7.3; will repeat CBC and follow hemoglobin trend. -No overt bleeding appreciated..   secondary hyperparathyroidism/hypocalcemia -Calcium gluconate given on 11/26/2020; repeat calcium today 6.4 but corrected for her hypoalbuminemia 8.0. -Continue monitoring electrolytes trend  -PhosLo has been added -Follow electrolytes trend.   super morbid obesity -Body mass index is 69.72 kg/m. -Lifestyle modification   S/p R-BKA -PT eval           Status is: Inpatient   Remains inpatient appropriate because:Persistent severe electrolyte disturbances   Dispo: The patient is from: Home              Anticipated d/c is to: SNF              Patient currently  is not medically stable to d/c.              Difficult to place patient No               Family Communication:   no Family at bedside   Consultants:  renal, vascular surgery   Code Status:  FULL   DVT Prophylaxis:  [place] Heparin     Procedures: As Listed in Progress Note Above   Antibiotics: None      Subjective: Patient denies fevers, chills, headache, chest  pain, dyspnea, nausea, vomiting, diarrhea, abdominal pain, dysuria, hematuria, hematochezia, and melena.   Objective: Vitals:   12/02/20 1500 12/02/20 1530 12/02/20 1550 12/02/20 1623  BP: (!) 154/89 (!) 156/86 (!) 159/81 (!) 182/93  Pulse: 85 84 86 89  Resp:    18  Temp:    98.8 F (37.1 C)  TempSrc:    Oral  SpO2:    98%  Weight:      Height:        Intake/Output Summary (Last 24 hours) at 12/02/2020 1813 Last data filed at 12/02/2020 1550 Gross per 24 hour  Intake 240 ml  Output 5250 ml  Net -5010 ml   Weight change: -0.4 kg Exam:  General:  Pt is alert, follows commands appropriately, not in acute distress HEENT: No icterus, No thrush, No neck mass, [place]/AT Cardiovascular: RRR, S1/S2, no rubs, no gallops Respiratory: bibasilar rales. No wheeze Abdomen: Soft/+BS, non tender, non distended, no guarding Extremities: 2 +LEedema, No lymphangitis, No petechiae, No rashes, no synovitis   Data Reviewed: I have personally reviewed following labs and imaging studies Basic Metabolic Panel: Recent Labs  Lab 11/26/20 0806 11/27/20 0559 11/28/20 0549 11/29/20 0432 11/30/20 0434 12/01/20 0333 12/02/20 0540  NA 137   < > 135 135 135 135 136  K 5.7*   < > 5.6* 5.2* 5.0 4.7 4.1  CL 114*   < > 110 110 108 106 105  CO2 15*   < > 18* 15* 17* 21* 20*  GLUCOSE 104*   < > 96 97 81 117* 137*  BUN 68*   < > 75* 76* 78* 69* 59*  CREATININE 6.54*   < > 6.76* 6.86* 6.87* 6.56* 5.52*  CALCIUM 6.2*   < > 6.4* 6.5* 6.6* 6.8* 7.0*  MG 1.5*  --   --   --   --   --   --   PHOS 7.6*   < > 7.7* 7.9* 7.6* 7.0* 5.4*   < > = values in this interval not displayed.   Liver Function Tests: Recent Labs  Lab 11/26/20 0806 11/27/20 0559 11/28/20 0549 11/29/20 0432 11/30/20 0434 12/01/20 0333 12/02/20 0540  AST 10*  --   --   --   --   --   --   ALT 10  --   --   --   --   --   --   ALKPHOS 171*  --   --   --   --   --   --   BILITOT 0.7  --   --   --   --   --   --   PROT 5.7*  --   --    --   --   --   --   ALBUMIN 2.1*   < > 2.0* 2.1* 2.0* 2.1* 2.2*   < > = values in this interval not displayed.   No results for input(s):  LIPASE, AMYLASE in the last 168 hours. No results for input(s): AMMONIA in the last 168 hours. Coagulation Profile: No results for input(s): INR, PROTIME in the last 168 hours. CBC: Recent Labs  Lab 11/26/20 0806 11/30/20 0434 12/01/20 2010  WBC 7.6 8.3 8.5  HGB 7.3* 7.0* 7.3*  HCT 24.3* 22.8* 22.9*  MCV 96.4 95.8 94.2  PLT 179 169 196   Cardiac Enzymes: No results for input(s): CKTOTAL, CKMB, CKMBINDEX, TROPONINI in the last 168 hours. BNP: Invalid input(s): POCBNP CBG: Recent Labs  Lab 12/01/20 1633 12/01/20 2241 12/02/20 0708 12/02/20 1116 12/02/20 1624  GLUCAP 142* 171* 135* 156* 129*   HbA1C: Recent Labs    12/01/20 0333  HGBA1C 6.1*   Urine analysis:    Component Value Date/Time   COLORURINE YELLOW 11/25/2020 2037   APPEARANCEUR CLEAR 11/25/2020 2037   LABSPEC 1.011 11/25/2020 2037   PHURINE 6.0 11/25/2020 2037   GLUCOSEU 150 (A) 11/25/2020 2037   HGBUR SMALL (A) 11/25/2020 2037   BILIRUBINUR NEGATIVE 11/25/2020 2037   BILIRUBINUR negative 08/13/2017 1622   KETONESUR NEGATIVE 11/25/2020 2037   PROTEINUR >=300 (A) 11/25/2020 2037   UROBILINOGEN 0.2 08/13/2017 1622   UROBILINOGEN 0.2 11/25/2014 0553   NITRITE NEGATIVE 11/25/2020 2037   LEUKOCYTESUR NEGATIVE 11/25/2020 2037   Sepsis Labs: '@LABRCNTIP'$ (procalcitonin:4,lacticidven:4) ) Recent Results (from the past 240 hour(s))  SARS CORONAVIRUS 2 (Saad Buhl 6-24 HRS) Nasopharyngeal Nasopharyngeal Swab     Status: None   Collection Time: 11/25/20  8:00 PM   Specimen: Nasopharyngeal Swab  Result Value Ref Range Status   SARS Coronavirus 2 NEGATIVE NEGATIVE Final    Comment: (NOTE) SARS-CoV-2 target nucleic acids are NOT DETECTED.  The SARS-CoV-2 RNA is generally detectable in upper and lower respiratory specimens during the acute phase of infection. Negative results  do not preclude SARS-CoV-2 infection, do not rule out co-infections with other pathogens, and should not be used as the sole basis for treatment or other patient management decisions. Negative results must be combined with clinical observations, patient history, and epidemiological information. The expected result is Negative.  Fact Sheet for Patients: SugarRoll.be  Fact Sheet for Healthcare Providers: https://www.woods-mathews.com/  This test is not yet approved or cleared by the [place] FDA and  has been authorized for detection and/or diagnosis of SARS-CoV-2 by FDA under an Emergency Use Authorization (EUA). This EUA will remain  in effect (meaning this test can be used) for the duration of the COVID-19 declaration under Se ction 564(b)(1) of the Act, 21 U.S.C. section 360bbb-3(b)(1), unless the authorization is terminated or revoked sooner.  Performed at [facility], [place] [address], [place], [place] [address]   Surgical pcr screen     Status: Abnormal   Collection Time: 11/28/20 10:11 PM   Specimen: Nasal Mucosa; Nasal Swab  Result Value Ref Range Status   MRSA, PCR POSITIVE (A) NEGATIVE Final    Comment: RESULT CALLED TO, READ BACK BY AND VERIFIED WITH: WATLINGTON,J'@0307'$  BY MATTHEWS, B 7.26.22    Staphylococcus aureus POSITIVE (A) NEGATIVE Final    Comment: RESULT CALLED TO, READ BACK BY AND VERIFIED WITH: WATLINGTON,J'@0307'$  BY MATTHEWS, B 7.26.22 (NOTE) The Xpert SA Assay (FDA approved for NASAL specimens in patients 58 years of age and older), is one component of a comprehensive surveillance program. It is not intended to diagnose infection nor to guide or monitor treatment. Performed at [facility], [address], [place], [place] [address]      Scheduled Meds:  calcium acetate  1,334 mg Oral TID WC   carvedilol  25 mg Oral BID   Chlorhexidine Gluconate Cloth  6 each Topical Q0600   darbepoetin (ARANESP)  injection - NON-DIALYSIS  200 mcg Subcutaneous Q Mon-1800   hydrALAZINE  50 mg Oral TID   insulin aspart  0-5 Units Subcutaneous QHS   insulin aspart  0-6 Units Subcutaneous TID WC   insulin glargine-yfgn  4 Units Subcutaneous QHS   isosorbide mononitrate  30 mg Oral Daily   levETIRAcetam  500 mg Oral BID   metolazone  10 mg Oral Daily   mupirocin ointment  1 application Nasal BID   polyethylene glycol  17 g Oral Daily   senna  2 tablet Oral Daily   sodium bicarbonate  650 mg Oral TID   tamsulosin  0.4 mg Oral QPC supper   Continuous Infusions:  sodium chloride     sodium chloride     furosemide 160 mg (12/02/20 1747)    Procedures/Studies: DG Wrist Complete Left  Result Date: 12/01/2020 CLINICAL DATA:  35 year old female with left wrist pain. EXAM: LEFT WRIST - COMPLETE 3+ VIEW COMPARISON:  None. FINDINGS: There is no acute fracture or dislocation. The bones are well mineralized. No arthritic changes. There is diffuse subcutaneous edema and soft tissue swelling. Vascular calcifications noted. IMPRESSION: 1. No acute fracture or dislocation. 2. Diffuse subcutaneous edema and vascular calcification. Electronically Signed   By: Anner Crete M.D.   On: 12/01/2020 20:21   US Venous Img Upper Uni Left (DVT)  Result Date: 12/02/2020 CLINICAL DATA:  35 year old female with a history of left wrist pain EXAM: LEFT UPPER EXTREMITY VENOUS DOPPLER ULTRASOUND TECHNIQUE: Gray-scale sonography with graded compression, as well as color Doppler and duplex ultrasound were performed to evaluate the upper extremity deep venous system from the level of the subclavian vein and including the jugular, axillary, basilic, radial, ulnar and upper cephalic vein. Spectral Doppler was utilized to evaluate flow at rest and with distal augmentation maneuvers. COMPARISON:  None. FINDINGS: Contralateral Subclavian Vein: Respiratory phasicity is normal and symmetric with the symptomatic side. No evidence of thrombus.  Normal compressibility. Internal Jugular Vein: No evidence of thrombus. Normal compressibility, respiratory phasicity and response to augmentation. Subclavian Vein: No evidence of thrombus. Normal compressibility, respiratory phasicity and response to augmentation. Axillary Vein: No evidence of thrombus. Normal compressibility, respiratory phasicity and response to augmentation. Cephalic Vein: No evidence of thrombus. Normal compressibility, respiratory phasicity and response to augmentation. Basilic Vein: No evidence of thrombus. Normal compressibility, respiratory phasicity and response to augmentation. Brachial Veins: No evidence of thrombus. Normal compressibility, respiratory phasicity and response to augmentation. Radial Veins: No evidence of thrombus. Normal compressibility, respiratory phasicity and response to augmentation. Ulnar Veins: No evidence of thrombus. Normal compressibility, respiratory phasicity and response to augmentation. Other Findings:  Edema IMPRESSION: Sonographic survey of the left upper extremity negative for DVT Edema Electronically Signed   By: Corrie Mckusick D.O.   On: 12/02/2020 11:40   DG Chest Port 1 View  Result Date: 11/29/2020 CLINICAL DATA:  Status post dialysis catheter placement. EXAM: PORTABLE CHEST 1 VIEW COMPARISON:  11/26/2020 FINDINGS: Stable cardiac enlargement. Interval placement of right jugular tunneled dialysis catheter with catheter tip overlying the right atrium. No pneumothorax. Slight decrease in pulmonary vascular congestion. No pleural effusions. IMPRESSION: Dialysis catheter tip overlies the right atrium. No pneumothorax. Slight decrease in pulmonary vascular congestion. Electronically Signed   By: Aletta Edouard M.D.   On: 11/29/2020 16:17   Portable chest 1 View  Result Date: 11/26/2020 CLINICAL DATA:  Pleural effusion. EXAM: PORTABLE CHEST 1 VIEW COMPARISON:  November 25, 2020. FINDINGS: Stable cardiomediastinal silhouette. Mild central pulmonary  vascular congestion may be present. Right basilar opacity is noted concerning for possible atelectasis and effusion. Bony thorax is unremarkable. IMPRESSION: Mild central pulmonary vascular congestion. Right basilar opacity is noted concerning for possible atelectasis and pleural effusion. Electronically Signed   By: Marijo Conception M.D.   On: 11/26/2020 10:10   DG Chest Portable 1 View  Result Date: 11/25/2020 CLINICAL DATA:  Edema. The patient states generalized weakness and swelling to lower limbs in the last 4 days. States SOB on exertion/ slight movement. Hx of diabetes, pulmonary edema, CHF, and HTN. Not being tested for covid-19 EXAM: PORTABLE CHEST 1 VIEW.  Patient is rotated. COMPARISON:  CT abdomen pelvis 10/20/2020 FINDINGS: Enlarged cardiac silhouette. The heart size and mediastinal contours are otherwise unchanged. Again noted is slightly more lucent left hemithorax compared to the right. No focal consolidation. Mild pulmonary edema. No pneumothorax. No acute osseous abnormality. IMPRESSION: 1. Bilateral pleural effusions that are difficult to evaluate on this AP semi upright view. Given opacity overlying the right hemithorax, there is a likely larger than trace right pleural effusion. Recommend PA and lateral view of the chest for further evaluation given size of effusions on CT chest 10/20/20. 2. Persistent enlargement of the cardiac silhouette. Underlying pericardial effusion not excluded. 3. Mild pulmonary edema. Electronically Signed   By: Iven Finn M.D.   On: 11/25/2020 19:11   DG C-Arm 1-60 Min-No Report  Result Date: 11/29/2020 Fluoroscopy was utilized by the requesting physician.  No radiographic interpretation.   ECHOCARDIOGRAM COMPLETE  Result Date: 11/26/2020    ECHOCARDIOGRAM REPORT   Patient Name:   Sheila Austin Date of Exam: 11/26/2020 Medical Rec #:  [id number]        Height:       62.0 in Accession #:    [id number]       Weight:       382.7 lb Date of Birth:   [birth year]        BSA:          2.519 m Patient Age:    71 years         BP:           181/96 mmHg Patient Gender: F                HR:           80 bpm. Exam Location:  [facility] Procedure: 2D Echo, Cardiac Doppler and Color Doppler Indications:    CHF  History:        Patient has prior history of Echocardiogram examinations, most                 recent 09/24/2020. CHF; Risk Factors:Hypertension, Diabetes and                 Dyslipidemia.  Sonographer:    Wenda Low Referring Phys: [id number] ASIA B [place]  1. Left ventricular ejection fraction, by estimation, is 60 to 65%. The left ventricle has normal function. The left ventricle has no regional wall motion abnormalities. There is mild concentric left ventricular hypertrophy. Left ventricular diastolic parameters are indeterminate.  2. Prominent moderator band present. Right ventricular systolic function is severely reduced. The right ventricular size is severely enlarged. There is mildly elevated pulmonary artery systolic pressure.  3. A small pericardial effusion is present.  The pericardial effusion is circumferential.  4. The mitral valve is normal in structure. Trivial mitral valve regurgitation. No evidence of mitral stenosis.  5. Tricuspid valve regurgitation is mild to moderate.  6. The aortic valve is normal in structure. Aortic valve regurgitation is not visualized. No aortic stenosis is present.  7. The inferior vena cava is normal in size with greater than 50% respiratory variability, suggesting right atrial pressure of 3 mmHg. FINDINGS  Left Ventricle: Left ventricular ejection fraction, by estimation, is 60 to 65%. The left ventricle has normal function. The left ventricle has no regional wall motion abnormalities. The left ventricular internal cavity size was normal in size. There is  mild concentric left ventricular hypertrophy. Left ventricular diastolic parameters are indeterminate. Normal left ventricular filling pressure.  Right Ventricle: Prominent moderator band present. The right ventricular size is severely enlarged. No increase in right ventricular wall thickness. Right ventricular systolic function is severely reduced. There is mildly elevated pulmonary artery systolic pressure. The tricuspid regurgitant velocity is 2.88 m/s, and with an assumed right atrial pressure of 8 mmHg, the estimated right ventricular systolic pressure is [id number] mmHg. Left Atrium: Left atrial size was normal in size. Right Atrium: Right atrial size was normal in size. Pericardium: A small pericardial effusion is present. The pericardial effusion is circumferential. Mitral Valve: The mitral valve is normal in structure. There is mild thickening of the mitral valve leaflet(s). Trivial mitral valve regurgitation. No evidence of mitral valve stenosis. MV peak gradient, 8.0 mmHg. The mean mitral valve gradient is 2.0 mmHg. Tricuspid Valve: The tricuspid valve is normal in structure. Tricuspid valve regurgitation is mild to moderate. No evidence of tricuspid stenosis. Aortic Valve: The aortic valve is normal in structure. Aortic valve regurgitation is not visualized. No aortic stenosis is present. Aortic valve mean gradient measures 6.0 mmHg. Aortic valve peak gradient measures 8.8 mmHg. Aortic valve area, by VTI measures 2.01 cm. Pulmonic Valve: The pulmonic valve was normal in structure. Pulmonic valve regurgitation is trivial. No evidence of pulmonic stenosis. Aorta: The aortic root is normal in size and structure. Venous: The inferior vena cava is normal in size with greater than 50% respiratory variability, suggesting right atrial pressure of 3 mmHg. IAS/Shunts: No atrial level shunt detected by color flow Doppler.  LEFT VENTRICLE PLAX 2D LVIDd:         4.71 cm  Diastology LVIDs:         3.45 cm  LV e' medial:    0.13 cm/s LV PW:         1.26 cm  LV E/e' medial:  11.1 LV IVS:        1.24 cm  LV e' lateral:   0.10 cm/s LVOT diam:     2.00 cm  LV E/e'  lateral: 14.5 LV SV:         68 LV SV Index:   27 LVOT Area:     3.14 cm  RIGHT VENTRICLE RV Basal diam:  4.05 cm RV Mid diam:    4.65 cm LEFT ATRIUM             Index       RIGHT ATRIUM           Index LA diam:        4.50 cm 1.79 cm/m  RA Area:     18.50 cm LA Vol (A2C):   37.9 ml 15.05 ml/m RA Volume:   49.20 ml  19.53 ml/m LA Vol (A4C):  52.5 ml 20.84 ml/m LA Biplane Vol: 46.5 ml 18.46 ml/m  AORTIC VALVE AV Area (Vmax):    2.29 cm AV Area (Vmean):   1.99 cm AV Area (VTI):     2.01 cm AV Vmax:           148.00 cm/s AV Vmean:          119.000 cm/s AV VTI:            0.339 m AV Peak Grad:      8.8 mmHg AV Mean Grad:      6.0 mmHg LVOT Vmax:         108.00 cm/s LVOT Vmean:        75.400 cm/s LVOT VTI:          0.217 m LVOT/AV VTI ratio: 0.64  AORTA Ao Root diam: 3.20 cm Ao Asc diam:  2.80 cm MITRAL VALVE               TRICUSPID VALVE MV Area (PHT): 5.66 cm    TR Peak grad:   33.2 mmHg MV Area VTI:   2.53 cm    TR Vmax:        288.00 cm/s MV Peak grad:  8.0 mmHg MV Mean grad:  2.0 mmHg    SHUNTS MV Vmax:       1.41 m/s    Systemic VTI:  0.22 m MV Vmean:      60.2 cm/s   Systemic Diam: 2.00 cm MV Decel Time: 134 msec MV E velocity: 1.45 cm/s MV A velocity: 65.30 cm/s MV E/A ratio:  0.02 Fransico Him MD Electronically signed by Fransico Him MD Signature Date/Time: 11/26/2020/12:32:16 PM    Final     Orson Eva, DO  [facility]  If 7PM-7AM, please contact night-coverage [email or web address] Password [email or web address] 12/02/2020, 6:13 PM   LOS: 7 days

## 2020-12-02 NOTE — Progress Notes (Addendum)
Patient's bp is 182/93. Patient was just given scheduled hydralazine. MD Tat made aware. MD Tat stated no need for PRN meds right now and changed the evening Coreg dose to 25 mg.

## 2020-12-02 NOTE — Progress Notes (Signed)
Sheila Austin called to confirm chair times at [facility] and changed the time to MWF at 12:15. Left Debbie a message to update.

## 2020-12-03 DIAGNOSIS — N185 Chronic kidney disease, stage 5: Secondary | ICD-10-CM | POA: Diagnosis not present

## 2020-12-03 DIAGNOSIS — I10 Essential (primary) hypertension: Secondary | ICD-10-CM | POA: Diagnosis not present

## 2020-12-03 DIAGNOSIS — D631 Anemia in chronic kidney disease: Secondary | ICD-10-CM | POA: Diagnosis not present

## 2020-12-03 DIAGNOSIS — N186 End stage renal disease: Secondary | ICD-10-CM | POA: Diagnosis not present

## 2020-12-03 LAB — RENAL FUNCTION PANEL
Albumin: 2.2 g/dL — ABNORMAL LOW (ref 3.5–5.0)
Anion gap: 8 (ref 5–15)
BUN: 43 mg/dL — ABNORMAL HIGH (ref 6–20)
CO2: 26 mmol/L (ref 22–32)
Calcium: 7.4 mg/dL — ABNORMAL LOW (ref 8.9–10.3)
Chloride: 101 mmol/L (ref 98–111)
Creatinine, Ser: 4.5 mg/dL — ABNORMAL HIGH (ref 0.44–1.00)
GFR, Estimated: 13 mL/min — ABNORMAL LOW (ref >60)
Glucose, Bld: 133 mg/dL — ABNORMAL HIGH (ref 70–99)
Phosphorus: 4.2 mg/dL (ref 2.5–4.6)
Potassium: 3.5 mmol/L (ref 3.5–5.1)
Sodium: 135 mmol/L (ref 135–145)

## 2020-12-03 LAB — GLUCOSE, CAPILLARY
Glucose-Capillary: 137 mg/dL — ABNORMAL HIGH (ref 70–99)
Glucose-Capillary: 143 mg/dL — ABNORMAL HIGH (ref 70–99)
Glucose-Capillary: 135 mg/dL — ABNORMAL HIGH (ref 70–99)
Glucose-Capillary: 152 mg/dL — ABNORMAL HIGH (ref 70–99)

## 2020-12-03 NOTE — Progress Notes (Signed)
PROGRESS NOTE  Sheila Austin [id number] DOB: [birth year] DOA: 11/25/2020 PCP: Pcp, No  Brief History:  As per H&P written by Dr. Clearence Ped on 11/26/20 35 y.o. female,with hx of CHF, DMII, HTN, GERD, morbid obesity, and chronic kidney disease presents to the ED with a chief complaint of peripheral edema.  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  She reports that she has a bariatric wheelchair at home, but she and her fianc live in a mobile home.  There are parts of the home, including the bathroom, where the bariatric wheelchair cannot access.  Patient usually crawls to the bathroom.  She reports that she has had increased swelling and peripheral edema, but she really started to notice it today, because she could not bend her knee to crawl on the floor.  She reports she can even get off the couch.  She also notes that she has had a decrease in urine output.  The symptoms have been associated with dyspnea on exertion, orthopnea, wheeze, and palpitations that last minutes.  Her symptoms are worse with exertion, better with rest.  She reports no cough.  She does have 2 L nasal cannula prescribed at home to wear 24/7.  She says she wears it at night and then she wears it when she needs it.  She has not been wearing it today.  Patient denies any fevers.  She has not had any dysuria or hematuria.  Patient is not on dialysis but her nephrologist in [place] has discussed this with her.  Dialysis discussion started in May 2022 and patient has been lost to follow-up since then as she does not have transport to get to her nephrologist in [place].    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 -appreciate nephrology -continue IV Lasix and metolazone--neg 22L -vascular surgery consulted and placed [facility] 11/29/20. -continue sodium  bicarbonate -d/c daily Lokelma now that patient is on HD -first HD 7/27 -HD 7/28 and 7/29 -Continue to follow electrolytes trend.   hypertension -In the setting of fluid overload most likely -Continue max dose of IV diuretics -Continue coreg, hydralazine, imdur -anticipate improvement with HD -Norvasc has been discontinued. -Low-sodium/renal diet discussed with patient. -increased coreg to 25 mg bid   type 2 diabetes with nephropathy -Continue sliding scale insulin and follow CBGs fluctuation. -09/24/20 A1C--5.9 -continue lantus -continue novolog sliding scale   Left wrist pain/edema -xray--no fractures -venous duplex LUE--no DVT -suspect muscle strain from patient using hands/wrists to crawl on floor -overall improving   anemia of chronic kidney disease -iron saturation 16, ferritin 164 -IV iron therapy and Epogen as per nephrology discretion. -Last hemoglobin check 7.3; will repeat CBC and follow hemoglobin trend. -No overt bleeding appreciated..   secondary hyperparathyroidism/hypocalcemia -Calcium gluconate given on 11/26/2020; repeat calcium today 6.4 but corrected for her hypoalbuminemia 8.0. -Continue monitoring electrolytes trend  -PhosLo has been added -Follow electrolytes trend.   super morbid obesity -Body mass index is 69.72 kg/m. -Lifestyle modification   S/p R-BKA -PT eval           Status is: Inpatient   Remains inpatient appropriate because:Persistent severe electrolyte disturbances   Dispo: The patient is from: Home              Anticipated d/c is to: SNF              Patient  currently is not medically stable to d/c.              Difficult to place patient No               Family Communication:   no Family at bedside   Consultants:  renal, vascular surgery   Code Status:  FULL   DVT Prophylaxis:  [place] Heparin     Procedures: As Listed in Progress Note Above   Antibiotics: None      Subjective: Left wrist pain is  improving.  Patient denies fevers, chills, headache, chest pain, dyspnea, nausea, vomiting, diarrhea, abdominal pain, dysuria, hematuria, hematochezia, and melena.   Objective: Vitals:   12/02/20 1623 12/02/20 2128 12/03/20 0603 12/03/20 1308  BP: (!) 182/93 (!) 172/88 (!) 158/86 (!) 162/86  Pulse: 89 95 87 88  Resp: '18 18 18 18  '$ Temp: 98.8 F (37.1 C) 98.7 F (37.1 C) 98.7 F (37.1 C) 98 F (36.7 C)  TempSrc: Oral Oral Oral Oral  SpO2: 98% 98% 96% 91%  Weight:      Height:        Intake/Output Summary (Last 24 hours) at 12/03/2020 1642 Last data filed at 12/03/2020 0900 Gross per 24 hour  Intake 530 ml  Output 2500 ml  Net -1970 ml   Weight change: -7.1 kg Exam:  General:  Pt is alert, follows commands appropriately, not in acute distress HEENT: No icterus, No thrush, No neck mass, Wallace/AT Cardiovascular: RRR, S1/S2, no rubs, no gallops Respiratory: fine bibasilar rales. No wheeze Abdomen: Soft/+BS, non tender, non distended, no guarding Extremities: 1 + LE edema, No lymphangitis, No petechiae, No rashes, no synovitis   Data Reviewed: I have personally reviewed following labs and imaging studies Basic Metabolic Panel: Recent Labs  Lab 11/29/20 0432 11/30/20 0434 12/01/20 0333 12/02/20 0540 12/03/20 0500  NA 135 135 135 136 135  K 5.2* 5.0 4.7 4.1 3.5  CL 110 108 106 105 101  CO2 15* 17* 21* 20* 26  GLUCOSE 97 81 117* 137* 133*  BUN 76* 78* 69* 59* 43*  CREATININE 6.86* 6.87* 6.56* 5.52* 4.50*  CALCIUM 6.5* 6.6* 6.8* 7.0* 7.4*  PHOS 7.9* 7.6* 7.0* 5.4* 4.2   Liver Function Tests: Recent Labs  Lab 11/29/20 0432 11/30/20 0434 12/01/20 0333 12/02/20 0540 12/03/20 0500  ALBUMIN 2.1* 2.0* 2.1* 2.2* 2.2*   No results for input(s): LIPASE, AMYLASE in the last 168 hours. No results for input(s): AMMONIA in the last 168 hours. Coagulation Profile: No results for input(s): INR, PROTIME in the last 168 hours. CBC: Recent Labs  Lab 11/30/20 0434  12/01/20 2010  WBC 8.3 8.5  HGB 7.0* 7.3*  HCT 22.8* 22.9*  MCV 95.8 94.2  PLT 169 196   Cardiac Enzymes: No results for input(s): CKTOTAL, CKMB, CKMBINDEX, TROPONINI in the last 168 hours. BNP: Invalid input(s): POCBNP CBG: Recent Labs  Lab 12/02/20 1624 12/02/20 2125 12/03/20 0727 12/03/20 1110 12/03/20 1625  GLUCAP 129* 161* 137* 143* 135*   HbA1C: Recent Labs    12/01/20 0333  HGBA1C 6.1*   Urine analysis:    Component Value Date/Time   COLORURINE YELLOW 11/25/2020 2037   APPEARANCEUR CLEAR 11/25/2020 2037   LABSPEC 1.011 11/25/2020 2037   PHURINE 6.0 11/25/2020 2037   GLUCOSEU 150 (A) 11/25/2020 2037   HGBUR SMALL (A) 11/25/2020 2037   BILIRUBINUR NEGATIVE 11/25/2020 2037   BILIRUBINUR negative 08/13/2017 1622   KETONESUR NEGATIVE 11/25/2020 2037   PROTEINUR >=300 (A) 11/25/2020  2037   UROBILINOGEN 0.2 08/13/2017 1622   UROBILINOGEN 0.2 11/25/2014 0553   NITRITE NEGATIVE 11/25/2020 2037   LEUKOCYTESUR NEGATIVE 11/25/2020 2037   Sepsis Labs: '@LABRCNTIP'$ (procalcitonin:4,lacticidven:4) ) Recent Results (from the past 240 hour(s))  SARS CORONAVIRUS 2 (Bunnie Lederman 6-24 HRS) Nasopharyngeal Nasopharyngeal Swab     Status: None   Collection Time: 11/25/20  8:00 PM   Specimen: Nasopharyngeal Swab  Result Value Ref Range Status   SARS Coronavirus 2 NEGATIVE NEGATIVE Final    Comment: (NOTE) SARS-CoV-2 target nucleic acids are NOT DETECTED.  The SARS-CoV-2 RNA is generally detectable in upper and lower respiratory specimens during the acute phase of infection. Negative results do not preclude SARS-CoV-2 infection, do not rule out co-infections with other pathogens, and should not be used as the sole basis for treatment or other patient management decisions. Negative results must be combined with clinical observations, patient history, and epidemiological information. The expected result is Negative.  Fact Sheet for  Patients: SugarRoll.be  Fact Sheet for Healthcare Providers: https://www.woods-mathews.com/  This test is not yet approved or cleared by the [place] FDA and  has been authorized for detection and/or diagnosis of SARS-CoV-2 by FDA under an Emergency Use Authorization (EUA). This EUA will remain  in effect (meaning this test can be used) for the duration of the COVID-19 declaration under Se ction 564(b)(1) of the Act, 21 U.S.C. section 360bbb-3(b)(1), unless the authorization is terminated or revoked sooner.  Performed at [facility], [place] [address] [place], [place] [address]   Surgical pcr screen     Status: Abnormal   Collection Time: 11/28/20 10:11 PM   Specimen: Nasal Mucosa; Nasal Swab  Result Value Ref Range Status   MRSA, PCR POSITIVE (A) NEGATIVE Final    Comment: RESULT CALLED TO, READ BACK BY AND VERIFIED WITH: WATLINGTON,J'@0307'$  BY MATTHEWS, B 7.26.22    Staphylococcus aureus POSITIVE (A) NEGATIVE Final    Comment: RESULT CALLED TO, READ BACK BY AND VERIFIED WITH: WATLINGTON,J'@0307'$  BY MATTHEWS, B 7.26.22 (NOTE) The Xpert SA Assay (FDA approved for NASAL specimens in patients 33 years of age and older), is one component of a comprehensive surveillance program. It is not intended to diagnose infection nor to guide or monitor treatment. Performed at [facility], [address] [place], [place] [address]      Scheduled Meds:  calcium acetate  1,334 mg Oral TID WC   carvedilol  25 mg Oral BID   Chlorhexidine Gluconate Cloth  6 each Topical Q0600   darbepoetin (ARANESP) injection - NON-DIALYSIS  200 mcg Subcutaneous Q Mon-1800   hydrALAZINE  50 mg Oral TID   insulin aspart  0-5 Units Subcutaneous QHS   insulin aspart  0-6 Units Subcutaneous TID WC   insulin glargine-yfgn  4 Units Subcutaneous QHS   isosorbide mononitrate  30 mg Oral Daily   levETIRAcetam  500 mg Oral BID   metolazone  10 mg Oral Daily    mupirocin ointment  1 application Nasal BID   polyethylene glycol  17 g Oral Daily   senna  2 tablet Oral Daily   sodium bicarbonate  650 mg Oral TID   tamsulosin  0.4 mg Oral QPC supper   Continuous Infusions:  sodium chloride     sodium chloride     furosemide 160 mg (12/03/20 0953)    Procedures/Studies: DG Wrist Complete Left  Result Date: 12/01/2020 CLINICAL DATA:  35 year old female with left wrist pain. EXAM: LEFT WRIST - COMPLETE 3+ VIEW COMPARISON:  None. FINDINGS: There is no acute fracture or dislocation. The bones are well mineralized. No arthritic changes. There is diffuse subcutaneous edema and soft tissue swelling. Vascular calcifications noted. IMPRESSION: 1. No acute fracture or dislocation. 2. Diffuse subcutaneous edema and vascular calcification. Electronically Signed   By: Anner Crete M.D.   On: 12/01/2020 20:21   US Venous Img Upper Uni Left (DVT)  Result Date: 12/02/2020 CLINICAL DATA:  35 year old female with a history of left wrist pain EXAM: LEFT UPPER EXTREMITY VENOUS DOPPLER ULTRASOUND TECHNIQUE: Gray-scale sonography with graded compression, as well as color Doppler and duplex ultrasound were performed to evaluate the upper extremity deep venous system from the level of the subclavian vein and including the jugular, axillary, basilic, radial, ulnar and upper cephalic vein. Spectral Doppler was utilized to evaluate flow at rest and with distal augmentation maneuvers. COMPARISON:  None. FINDINGS: Contralateral Subclavian Vein: Respiratory phasicity is normal and symmetric with the symptomatic side. No evidence of thrombus. Normal compressibility. Internal Jugular Vein: No evidence of thrombus. Normal compressibility, respiratory phasicity and response to augmentation. Subclavian Vein: No evidence of thrombus. Normal compressibility, respiratory phasicity and response to augmentation. Axillary Vein: No evidence of thrombus. Normal compressibility, respiratory  phasicity and response to augmentation. Cephalic Vein: No evidence of thrombus. Normal compressibility, respiratory phasicity and response to augmentation. Basilic Vein: No evidence of thrombus. Normal compressibility, respiratory phasicity and response to augmentation. Brachial Veins: No evidence of thrombus. Normal compressibility, respiratory phasicity and response to augmentation. Radial Veins: No evidence of thrombus. Normal compressibility, respiratory phasicity and response to augmentation. Ulnar Veins: No evidence of thrombus. Normal compressibility, respiratory phasicity and response to augmentation. Other Findings:  Edema IMPRESSION: Sonographic survey of the left upper extremity negative for DVT Edema Electronically Signed   By: Corrie Mckusick D.O.   On: 12/02/2020 11:40   DG Chest Port 1 View  Result Date: 11/29/2020 CLINICAL DATA:  Status post dialysis catheter placement. EXAM: PORTABLE CHEST 1 VIEW COMPARISON:  11/26/2020 FINDINGS: Stable cardiac enlargement. Interval placement of right jugular tunneled dialysis catheter with catheter tip overlying the right atrium. No pneumothorax. Slight decrease in pulmonary vascular congestion. No pleural effusions. IMPRESSION: Dialysis catheter tip overlies the right atrium. No pneumothorax. Slight decrease in pulmonary vascular congestion. Electronically Signed   By: Aletta Edouard M.D.   On: 11/29/2020 16:17   Portable chest 1 View  Result Date: 11/26/2020 CLINICAL DATA:  Pleural effusion. EXAM: PORTABLE CHEST 1 VIEW COMPARISON:  November 25, 2020. FINDINGS: Stable cardiomediastinal silhouette. Mild central pulmonary vascular congestion may be present. Right basilar opacity is noted concerning for possible atelectasis and effusion. Bony thorax is unremarkable. IMPRESSION: Mild central pulmonary vascular congestion. Right basilar opacity is noted concerning for possible atelectasis and pleural effusion. Electronically Signed   By: Marijo Conception M.D.   On:  11/26/2020 10:10   DG Chest Portable 1 View  Result Date: 11/25/2020 CLINICAL DATA:  Edema. The patient states generalized weakness and swelling to lower limbs in the last 4 days. States SOB on exertion/ slight movement. Hx of diabetes, pulmonary edema, CHF, and HTN. Not being tested for covid-19 EXAM: PORTABLE CHEST 1 VIEW.  Patient is rotated. COMPARISON:  CT abdomen pelvis 10/20/2020 FINDINGS: Enlarged cardiac silhouette. The heart size and mediastinal contours are otherwise unchanged. Again noted is slightly more lucent left hemithorax compared to the right. No focal consolidation. Mild pulmonary edema. No pneumothorax. No acute osseous abnormality. IMPRESSION: 1. Bilateral pleural effusions that are difficult to evaluate on this AP  semi upright view. Given opacity overlying the right hemithorax, there is a likely larger than trace right pleural effusion. Recommend PA and lateral view of the chest for further evaluation given size of effusions on CT chest 10/20/20. 2. Persistent enlargement of the cardiac silhouette. Underlying pericardial effusion not excluded. 3. Mild pulmonary edema. Electronically Signed   By: Iven Finn M.D.   On: 11/25/2020 19:11   DG C-Arm 1-60 Min-No Report  Result Date: 11/29/2020 Fluoroscopy was utilized by the requesting physician.  No radiographic interpretation.   ECHOCARDIOGRAM COMPLETE  Result Date: 11/26/2020    ECHOCARDIOGRAM REPORT   Patient Name:   TAYHA LIMONE Date of Exam: 11/26/2020 Medical Rec #:  [id number]        Height:       62.0 in Accession #:    [id number]       Weight:       382.7 lb Date of Birth:  1986/03/01        BSA:          2.519 m Patient Age:    12 years         BP:           181/96 mmHg Patient Gender: F                HR:           80 bpm. Exam Location:  [facility] Procedure: 2D Echo, Cardiac Doppler and Color Doppler Indications:    CHF  History:        Patient has prior history of Echocardiogram examinations, most                  recent 09/24/2020. CHF; Risk Factors:Hypertension, Diabetes and                 Dyslipidemia.  Sonographer:    Wenda Low Referring Phys: [id number] ASIA B [place]  1. Left ventricular ejection fraction, by estimation, is 60 to 65%. The left ventricle has normal function. The left ventricle has no regional wall motion abnormalities. There is mild concentric left ventricular hypertrophy. Left ventricular diastolic parameters are indeterminate.  2. Prominent moderator band present. Right ventricular systolic function is severely reduced. The right ventricular size is severely enlarged. There is mildly elevated pulmonary artery systolic pressure.  3. A small pericardial effusion is present. The pericardial effusion is circumferential.  4. The mitral valve is normal in structure. Trivial mitral valve regurgitation. No evidence of mitral stenosis.  5. Tricuspid valve regurgitation is mild to moderate.  6. The aortic valve is normal in structure. Aortic valve regurgitation is not visualized. No aortic stenosis is present.  7. The inferior vena cava is normal in size with greater than 50% respiratory variability, suggesting right atrial pressure of 3 mmHg. FINDINGS  Left Ventricle: Left ventricular ejection fraction, by estimation, is 60 to 65%. The left ventricle has normal function. The left ventricle has no regional wall motion abnormalities. The left ventricular internal cavity size was normal in size. There is  mild concentric left ventricular hypertrophy. Left ventricular diastolic parameters are indeterminate. Normal left ventricular filling pressure. Right Ventricle: Prominent moderator band present. The right ventricular size is severely enlarged. No increase in right ventricular wall thickness. Right ventricular systolic function is severely reduced. There is mildly elevated pulmonary artery systolic pressure. The tricuspid regurgitant velocity is 2.88 m/s, and with an assumed right atrial  pressure of 8 mmHg, the estimated right ventricular systolic pressure is  41.2 mmHg. Left Atrium: Left atrial size was normal in size. Right Atrium: Right atrial size was normal in size. Pericardium: A small pericardial effusion is present. The pericardial effusion is circumferential. Mitral Valve: The mitral valve is normal in structure. There is mild thickening of the mitral valve leaflet(s). Trivial mitral valve regurgitation. No evidence of mitral valve stenosis. MV peak gradient, 8.0 mmHg. The mean mitral valve gradient is 2.0 mmHg. Tricuspid Valve: The tricuspid valve is normal in structure. Tricuspid valve regurgitation is mild to moderate. No evidence of tricuspid stenosis. Aortic Valve: The aortic valve is normal in structure. Aortic valve regurgitation is not visualized. No aortic stenosis is present. Aortic valve mean gradient measures 6.0 mmHg. Aortic valve peak gradient measures 8.8 mmHg. Aortic valve area, by VTI measures 2.01 cm. Pulmonic Valve: The pulmonic valve was normal in structure. Pulmonic valve regurgitation is trivial. No evidence of pulmonic stenosis. Aorta: The aortic root is normal in size and structure. Venous: The inferior vena cava is normal in size with greater than 50% respiratory variability, suggesting right atrial pressure of 3 mmHg. IAS/Shunts: No atrial level shunt detected by color flow Doppler.  LEFT VENTRICLE PLAX 2D LVIDd:         4.71 cm  Diastology LVIDs:         3.45 cm  LV e' medial:    0.13 cm/s LV PW:         1.26 cm  LV E/e' medial:  11.1 LV IVS:        1.24 cm  LV e' lateral:   0.10 cm/s LVOT diam:     2.00 cm  LV E/e' lateral: 14.5 LV SV:         68 LV SV Index:   27 LVOT Area:     3.14 cm  RIGHT VENTRICLE RV Basal diam:  4.05 cm RV Mid diam:    4.65 cm LEFT ATRIUM             Index       RIGHT ATRIUM           Index LA diam:        4.50 cm 1.79 cm/m  RA Area:     18.50 cm LA Vol (A2C):   37.9 ml 15.05 ml/m RA Volume:   49.20 ml  19.53 ml/m LA Vol (A4C):   52.5  ml 20.84 ml/m LA Biplane Vol: 46.5 ml 18.46 ml/m  AORTIC VALVE AV Area (Vmax):    2.29 cm AV Area (Vmean):   1.99 cm AV Area (VTI):     2.01 cm AV Vmax:           148.00 cm/s AV Vmean:          119.000 cm/s AV VTI:            0.339 m AV Peak Grad:      8.8 mmHg AV Mean Grad:      6.0 mmHg LVOT Vmax:         108.00 cm/s LVOT Vmean:        75.400 cm/s LVOT VTI:          0.217 m LVOT/AV VTI ratio: 0.64  AORTA Ao Root diam: 3.20 cm Ao Asc diam:  2.80 cm MITRAL VALVE               TRICUSPID VALVE MV Area (PHT): 5.66 cm    TR Peak grad:   33.2 mmHg MV Area VTI:   2.53 cm  TR Vmax:        288.00 cm/s MV Peak grad:  8.0 mmHg MV Mean grad:  2.0 mmHg    SHUNTS MV Vmax:       1.41 m/s    Systemic VTI:  0.22 m MV Vmean:      60.2 cm/s   Systemic Diam: 2.00 cm MV Decel Time: 134 msec MV E velocity: 1.45 cm/s MV A velocity: 65.30 cm/s MV E/A ratio:  0.02 Fransico Him MD Electronically signed by Fransico Him MD Signature Date/Time: 11/26/2020/12:32:16 PM    Final     Orson Eva, DO  [facility]  If 7PM-7AM, please contact night-coverage [email or web address] Password [email or web address] 12/03/2020, 4:42 PM   LOS: 8 days

## 2020-12-03 NOTE — TOC Progression Note (Signed)
Transition of Care [facility]) - Progression Note    Patient Details  Name: KEYANNAH BOODOO MRN: [id number] Date of Birth: April 15, 1986  Transition of [facility]) CM/SW Contact  Natasha Bence, LCSW Phone Number: 12/03/2020, 3:11 PM  Clinical Narrative:    CSW discussed contacted Debbie to inquire about agreeableness to take patient. De[facility] reported that [facility] not [facility] any female beds until Monday. [facility] Poling also reported that she has not received authorization for patient yet. CSW notified Debbie of patient's chair time. TOC to follow.    Expected Discharge Plan: Skilled Nursing Facility Barriers to Discharge: Continued Medical Work up  Expected Discharge Plan and Services Expected Discharge Plan: [facility]                                               Social Determinants of Health (SDOH) Interventions    Readmission Risk Interventions Readmission Risk Prevention Plan 11/30/2020 11/28/2020  Transportation Screening Complete Complete  PCP or Specialist Appt within 3-5 Days - Complete  HRI or [place] - Complete  Social Work Consult for [place] Planning/Counseling - Complete  Palliative Care Screening - Not Applicable  Medication Review Press photographer) Complete Complete  HRI or Home Care Consult Complete -  SW Recovery Care/Counseling Consult Complete -  Palliative Care Screening Not Applicable -  Skilled Nursing Facility Complete -  Some recent data might be hidden

## 2020-12-04 DIAGNOSIS — E875 Hyperkalemia: Secondary | ICD-10-CM | POA: Diagnosis not present

## 2020-12-04 DIAGNOSIS — N049 Nephrotic syndrome with unspecified morphologic changes: Secondary | ICD-10-CM | POA: Diagnosis not present

## 2020-12-04 DIAGNOSIS — N186 End stage renal disease: Secondary | ICD-10-CM | POA: Diagnosis not present

## 2020-12-04 LAB — RENAL FUNCTION PANEL
Albumin: 2.2 g/dL — ABNORMAL LOW (ref 3.5–5.0)
Anion gap: 9 (ref 5–15)
BUN: 45 mg/dL — ABNORMAL HIGH (ref 6–20)
CO2: 27 mmol/L (ref 22–32)
Calcium: 7.7 mg/dL — ABNORMAL LOW (ref 8.9–10.3)
Chloride: 100 mmol/L (ref 98–111)
Creatinine, Ser: 4.92 mg/dL — ABNORMAL HIGH (ref 0.44–1.00)
GFR, Estimated: 11 mL/min — ABNORMAL LOW (ref >60)
Glucose, Bld: 141 mg/dL — ABNORMAL HIGH (ref 70–99)
Phosphorus: 4.4 mg/dL (ref 2.5–4.6)
Potassium: 3.8 mmol/L (ref 3.5–5.1)
Sodium: 136 mmol/L (ref 135–145)

## 2020-12-04 LAB — GLUCOSE, CAPILLARY
Glucose-Capillary: 125 mg/dL — ABNORMAL HIGH (ref 70–99)
Glucose-Capillary: 142 mg/dL — ABNORMAL HIGH (ref 70–99)
Glucose-Capillary: 164 mg/dL — ABNORMAL HIGH (ref 70–99)

## 2020-12-04 MED ORDER — CHLORHEXIDINE GLUCONATE CLOTH 2 % EX PADS
6.0000 | MEDICATED_PAD | Freq: Every day | CUTANEOUS | Status: DC
  Administered 2020-12-04 – 2020-12-07 (×4): 6 via TOPICAL

## 2020-12-04 NOTE — Progress Notes (Signed)
PROGRESS NOTE  Sheila Austin [id number] DOB: 03-Mar-1986 DOA: 11/25/2020 PCP: Pcp, No   Brief History:  As per H&P written by Dr. Clearence Ped on 11/26/20 35 y.o. female,with hx of CHF, DMII, HTN, GERD, morbid obesity, and chronic kidney disease presents to the ED with a chief complaint of peripheral edema.  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  She reports that she has a bariatric wheelchair at home, but she and her fianc live in a mobile home.  There are parts of the home, including the bathroom, where the bariatric wheelchair cannot access.  Patient usually crawls to the bathroom.  She reports that she has had increased swelling and peripheral edema, but she really started to notice it today, because she could not bend her knee to crawl on the floor.  She reports she can even get off the couch.  She also notes that she has had a decrease in urine output.  The symptoms have been associated with dyspnea on exertion, orthopnea, wheeze, and palpitations that last minutes.  Her symptoms are worse with exertion, better with rest.  She reports no cough.  She does have 2 L nasal cannula prescribed at home to wear 24/7.  She says she wears it at night and then she wears it when she needs it.  She has not been wearing it today.  Patient denies any fevers.  She has not had any dysuria or hematuria.  Patient is not on dialysis but her nephrologist in [place] has discussed this with her.  Dialysis discussion started in May 2022 and patient has been lost to follow-up since then as she does not have transport to get to her nephrologist in [place].    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 -appreciate nephrology -continue IV Lasix and metolazone--neg 24.5L -vascular surgery consulted and placed [facility] 11/29/20. -continue sodium  bicarbonate -d/c daily Lokelma now that patient is on HD -first HD 7/27 -HD 7/28 and 7/29 -Continue to follow electrolytes trend.   hypertension -In the setting of fluid overload most likely -Continue max dose of IV diuretics -Continue coreg, hydralazine, imdur -anticipate improvement with HD -Norvasc has been discontinued. -Low-sodium/renal diet discussed with patient. -increased coreg to 25 mg bid   type 2 diabetes with nephropathy -Continue sliding scale insulin and follow CBGs fluctuation. -09/24/20 A1C--5.9 -continue lantus -continue novolog sliding scale   Left wrist pain/edema -xray--no fractures -venous duplex LUE--no DVT -suspect muscle strain from patient using hands/wrists to crawl on floor -overall improving   anemia of chronic kidney disease -iron saturation 16, ferritin 164 -IV iron therapy and Epogen as per nephrology discretion. -Last hemoglobin check 7.3; will repeat CBC and follow hemoglobin trend. -No overt bleeding appreciated..   secondary hyperparathyroidism/hypocalcemia -Calcium gluconate given on 11/26/2020; repeat calcium today 6.4 but corrected for her hypoalbuminemia 8.0. -Continue monitoring electrolytes trend  -PhosLo has been added -Follow electrolytes trend.   super morbid obesity -Body mass index is 69.72 kg/m. -Lifestyle modification   S/p R-BKA -PT eval>>SNF           Status is: Inpatient   Remains inpatient appropriate because:Persistent severe electrolyte disturbances   Dispo: The patient is from: Home              Anticipated d/c is to: SNF  Patient currently is not medically stable to d/c.              Difficult to place patient No               Family Communication:   no Family at bedside   Consultants:  renal, vascular surgery   Code Status:  FULL   DVT Prophylaxis:  [place] Heparin     Procedures: As Listed in Progress Note Above   Antibiotics: None         Subjective: Patient denies  fevers, chills, headache, chest pain, dyspnea, nausea, vomiting, diarrhea, abdominal pain, dysuria, hematuria, hematochezia, and melena.   Objective: Vitals:   12/04/20 0619 12/04/20 0805 12/04/20 1323 12/04/20 1446  BP: 134/77 (!) 160/92 (!) 156/80 (!) 149/81  Pulse: 84 81 82   Resp: 18     Temp: 100 F (37.8 C)  98.9 F (37.2 C)   TempSrc: Oral  Oral   SpO2: (!) 85%  93%   Weight:      Height:        Intake/Output Summary (Last 24 hours) at 12/04/2020 1719 Last data filed at 12/04/2020 1300 Gross per 24 hour  Intake 630 ml  Output 1500 ml  Net -870 ml   Weight change: -5.7 kg Exam:  General:  Pt is alert, follows commands appropriately, not in acute distress HEENT: No icterus, No thrush, No neck mass, [place]/AT Cardiovascular: RRR, S1/S2, no rubs, no gallops Respiratory: bibasilar rales.  No wheeze Abdomen: Soft/+BS, non tender, non distended, no guarding Extremities: 2+LE edema, No lymphangitis, No petechiae, No rashes, no synovitis   Data Reviewed: I have personally reviewed following labs and imaging studies Basic Metabolic Panel: Recent Labs  Lab 11/30/20 0434 12/01/20 0333 12/02/20 0540 12/03/20 0500 12/04/20 0228  NA 135 135 136 135 136  K 5.0 4.7 4.1 3.5 3.8  CL 108 106 105 101 100  CO2 17* 21* 20* 26 27  GLUCOSE 81 117* 137* 133* 141*  BUN 78* 69* 59* 43* 45*  CREATININE 6.87* 6.56* 5.52* 4.50* 4.92*  CALCIUM 6.6* 6.8* 7.0* 7.4* 7.7*  PHOS 7.6* 7.0* 5.4* 4.2 4.4   Liver Function Tests: Recent Labs  Lab 11/30/20 0434 12/01/20 0333 12/02/20 0540 12/03/20 0500 12/04/20 0228  ALBUMIN 2.0* 2.1* 2.2* 2.2* 2.2*   No results for input(s): LIPASE, AMYLASE in the last 168 hours. No results for input(s): AMMONIA in the last 168 hours. Coagulation Profile: No results for input(s): INR, PROTIME in the last 168 hours. CBC: Recent Labs  Lab 11/30/20 0434 12/01/20 2010  WBC 8.3 8.5  HGB 7.0* 7.3*  HCT 22.8* 22.9*  MCV 95.8 94.2  PLT 169 196    Cardiac Enzymes: No results for input(s): CKTOTAL, CKMB, CKMBINDEX, TROPONINI in the last 168 hours. BNP: Invalid input(s): POCBNP CBG: Recent Labs  Lab 12/03/20 1110 12/03/20 1625 12/03/20 2138 12/04/20 0725 12/04/20 1112  GLUCAP 143* 135* 152* 125* 142*   HbA1C: No results for input(s): HGBA1C in the last 72 hours. Urine analysis:    Component Value Date/Time   COLORURINE YELLOW 11/25/2020 2037   APPEARANCEUR CLEAR 11/25/2020 2037   LABSPEC 1.011 11/25/2020 2037   PHURINE 6.0 11/25/2020 2037   GLUCOSEU 150 (A) 11/25/2020 2037   HGBUR SMALL (A) 11/25/2020 2037   BILIRUBINUR NEGATIVE 11/25/2020 2037   BILIRUBINUR negative 08/13/2017 1622   KETONESUR NEGATIVE 11/25/2020 2037   PROTEINUR >=300 (A) 11/25/2020 2037   UROBILINOGEN 0.2 08/13/2017 1622   UROBILINOGEN 0.2 11/25/2014  [place] 11/25/2020 2037   LEUKOCYTESUR NEGATIVE 11/25/2020 2037   Sepsis Labs: '@LABRCNTIP'$ (procalcitonin:4,lacticidven:4) ) Recent Results (from the past 240 hour(s))  SARS CORONAVIRUS 2 (Amadea Keagy 6-24 HRS) Nasopharyngeal Nasopharyngeal Swab     Status: None   Collection Time: 11/25/20  8:00 PM   Specimen: Nasopharyngeal Swab  Result Value Ref Range Status   SARS Coronavirus 2 NEGATIVE NEGATIVE Final    Comment: (NOTE) SARS-CoV-2 target nucleic acids are NOT DETECTED.  The SARS-CoV-2 RNA is generally detectable in upper and lower respiratory specimens during the acute phase of infection. Negative results do not preclude SARS-CoV-2 infection, do not rule out co-infections with other pathogens, and should not be used as the sole basis for treatment or other patient management decisions. Negative results must be combined with clinical observations, patient history, and epidemiological information. The expected result is Negative.  Fact Sheet for Patients: SugarRoll.be  Fact Sheet for Healthcare  Providers: https://www.woods-mathews.com/  This test is not yet approved or cleared by the [place] FDA and  has been authorized for detection and/or diagnosis of SARS-CoV-2 by FDA under an Emergency Use Authorization (EUA). This EUA will remain  in effect (meaning this test can be used) for the duration of the COVID-19 declaration under Se ction 564(b)(1) of the Act, 21 U.S.C. section 360bbb-3(b)(1), unless the authorization is terminated or revoked sooner.  Performed at [facility], [place] [address] [place], [place] [address]   Surgical pcr screen     Status: Abnormal   Collection Time: 11/28/20 10:11 PM   Specimen: Nasal Mucosa; Nasal Swab  Result Value Ref Range Status   MRSA, PCR POSITIVE (A) NEGATIVE Final    Comment: RESULT CALLED TO, READ BACK BY AND VERIFIED WITH: WATLINGTON,J'@0307'$  BY MATTHEWS, B 7.26.22    Staphylococcus aureus POSITIVE (A) NEGATIVE Final    Comment: RESULT CALLED TO, READ BACK BY AND VERIFIED WITH: WATLINGTON,J'@0307'$  BY MATTHEWS, B 7.26.22 (NOTE) The Xpert SA Assay (FDA approved for NASAL specimens in patients 64 years of age and older), is one component of a comprehensive surveillance program. It is not intended to diagnose infection nor to guide or monitor treatment. Performed at [facility], [address], [place], [place] [address]      Scheduled Meds:  calcium acetate  1,334 mg Oral TID WC   carvedilol  25 mg Oral BID   Chlorhexidine Gluconate Cloth  6 each Topical Daily   darbepoetin (ARANESP) injection - NON-DIALYSIS  200 mcg Subcutaneous Q Mon-1800   hydrALAZINE  50 mg Oral TID   insulin aspart  0-5 Units Subcutaneous QHS   insulin aspart  0-6 Units Subcutaneous TID WC   insulin glargine-yfgn  4 Units Subcutaneous QHS   isosorbide mononitrate  30 mg Oral Daily   levETIRAcetam  500 mg Oral BID   metolazone  10 mg Oral Daily   polyethylene glycol  17 g Oral Daily   senna  2 tablet Oral Daily   sodium bicarbonate   650 mg Oral TID   tamsulosin  0.4 mg Oral QPC supper   Continuous Infusions:  sodium chloride     sodium chloride     furosemide 160 mg (12/04/20 1711)    Procedures/Studies: DG Wrist Complete Left  Result Date: 12/01/2020 CLINICAL DATA:  35 year old female with left wrist pain. EXAM: LEFT WRIST - COMPLETE 3+ VIEW COMPARISON:  None. FINDINGS: There is no acute fracture or dislocation. The bones are well mineralized. No arthritic changes. There is diffuse subcutaneous  edema and soft tissue swelling. Vascular calcifications noted. IMPRESSION: 1. No acute fracture or dislocation. 2. Diffuse subcutaneous edema and vascular calcification. Electronically Signed   By: Anner Crete M.D.   On: 12/01/2020 20:21   US Venous Img Upper Uni Left (DVT)  Result Date: 12/02/2020 CLINICAL DATA:  35 year old female with a history of left wrist pain EXAM: LEFT UPPER EXTREMITY VENOUS DOPPLER ULTRASOUND TECHNIQUE: Gray-scale sonography with graded compression, as well as color Doppler and duplex ultrasound were performed to evaluate the upper extremity deep venous system from the level of the subclavian vein and including the jugular, axillary, basilic, radial, ulnar and upper cephalic vein. Spectral Doppler was utilized to evaluate flow at rest and with distal augmentation maneuvers. COMPARISON:  None. FINDINGS: Contralateral Subclavian Vein: Respiratory phasicity is normal and symmetric with the symptomatic side. No evidence of thrombus. Normal compressibility. Internal Jugular Vein: No evidence of thrombus. Normal compressibility, respiratory phasicity and response to augmentation. Subclavian Vein: No evidence of thrombus. Normal compressibility, respiratory phasicity and response to augmentation. Axillary Vein: No evidence of thrombus. Normal compressibility, respiratory phasicity and response to augmentation. Cephalic Vein: No evidence of thrombus. Normal compressibility, respiratory phasicity and response to  augmentation. Basilic Vein: No evidence of thrombus. Normal compressibility, respiratory phasicity and response to augmentation. Brachial Veins: No evidence of thrombus. Normal compressibility, respiratory phasicity and response to augmentation. Radial Veins: No evidence of thrombus. Normal compressibility, respiratory phasicity and response to augmentation. Ulnar Veins: No evidence of thrombus. Normal compressibility, respiratory phasicity and response to augmentation. Other Findings:  Edema IMPRESSION: Sonographic survey of the left upper extremity negative for DVT Edema Electronically Signed   By: Corrie Mckusick D.O.   On: 12/02/2020 11:40   DG Chest Port 1 View  Result Date: 11/29/2020 CLINICAL DATA:  Status post dialysis catheter placement. EXAM: PORTABLE CHEST 1 VIEW COMPARISON:  11/26/2020 FINDINGS: Stable cardiac enlargement. Interval placement of right jugular tunneled dialysis catheter with catheter tip overlying the right atrium. No pneumothorax. Slight decrease in pulmonary vascular congestion. No pleural effusions. IMPRESSION: Dialysis catheter tip overlies the right atrium. No pneumothorax. Slight decrease in pulmonary vascular congestion. Electronically Signed   By: Aletta Edouard M.D.   On: 11/29/2020 16:17   Portable chest 1 View  Result Date: 11/26/2020 CLINICAL DATA:  Pleural effusion. EXAM: PORTABLE CHEST 1 VIEW COMPARISON:  November 25, 2020. FINDINGS: Stable cardiomediastinal silhouette. Mild central pulmonary vascular congestion may be present. Right basilar opacity is noted concerning for possible atelectasis and effusion. Bony thorax is unremarkable. IMPRESSION: Mild central pulmonary vascular congestion. Right basilar opacity is noted concerning for possible atelectasis and pleural effusion. Electronically Signed   By: Marijo Conception M.D.   On: 11/26/2020 10:10   DG Chest Portable 1 View  Result Date: 11/25/2020 CLINICAL DATA:  Edema. The patient states generalized weakness and  swelling to lower limbs in the last 4 days. States SOB on exertion/ slight movement. Hx of diabetes, pulmonary edema, CHF, and HTN. Not being tested for covid-19 EXAM: PORTABLE CHEST 1 VIEW.  Patient is rotated. COMPARISON:  CT abdomen pelvis 10/20/2020 FINDINGS: Enlarged cardiac silhouette. The heart size and mediastinal contours are otherwise unchanged. Again noted is slightly more lucent left hemithorax compared to the right. No focal consolidation. Mild pulmonary edema. No pneumothorax. No acute osseous abnormality. IMPRESSION: 1. Bilateral pleural effusions that are difficult to evaluate on this AP semi upright view. Given opacity overlying the right hemithorax, there is a likely larger than trace right pleural effusion. Recommend PA  and lateral view of the chest for further evaluation given size of effusions on CT chest 10/20/20. 2. Persistent enlargement of the cardiac silhouette. Underlying pericardial effusion not excluded. 3. Mild pulmonary edema. Electronically Signed   By: Iven Finn M.D.   On: 11/25/2020 19:11   DG C-Arm 1-60 Min-No Report  Result Date: 11/29/2020 Fluoroscopy was utilized by the requesting physician.  No radiographic interpretation.   ECHOCARDIOGRAM COMPLETE  Result Date: 11/26/2020    ECHOCARDIOGRAM REPORT   Patient Name:   NAZARETH MORADO Date of Exam: 11/26/2020 Medical Rec #:  [id number]        Height:       62.0 in Accession #:    [id number]       Weight:       382.7 lb Date of Birth:  Jul 14, 1985        BSA:          2.519 m Patient Age:    35 years         BP:           181/96 mmHg Patient Gender: F                HR:           80 bpm. Exam Location:  [facility] Procedure: 2D Echo, Cardiac Doppler and Color Doppler Indications:    CHF  History:        Patient has prior history of Echocardiogram examinations, most                 recent 09/24/2020. CHF; Risk Factors:Hypertension, Diabetes and                 Dyslipidemia.  Sonographer:    Wenda Low Referring Phys:  [id number] ASIA B [place]  1. Left ventricular ejection fraction, by estimation, is 60 to 65%. The left ventricle has normal function. The left ventricle has no regional wall motion abnormalities. There is mild concentric left ventricular hypertrophy. Left ventricular diastolic parameters are indeterminate.  2. Prominent moderator band present. Right ventricular systolic function is severely reduced. The right ventricular size is severely enlarged. There is mildly elevated pulmonary artery systolic pressure.  3. A small pericardial effusion is present. The pericardial effusion is circumferential.  4. The mitral valve is normal in structure. Trivial mitral valve regurgitation. No evidence of mitral stenosis.  5. Tricuspid valve regurgitation is mild to moderate.  6. The aortic valve is normal in structure. Aortic valve regurgitation is not visualized. No aortic stenosis is present.  7. The inferior vena cava is normal in size with greater than 50% respiratory variability, suggesting right atrial pressure of 3 mmHg. FINDINGS  Left Ventricle: Left ventricular ejection fraction, by estimation, is 60 to 65%. The left ventricle has normal function. The left ventricle has no regional wall motion abnormalities. The left ventricular internal cavity size was normal in size. There is  mild concentric left ventricular hypertrophy. Left ventricular diastolic parameters are indeterminate. Normal left ventricular filling pressure. Right Ventricle: Prominent moderator band present. The right ventricular size is severely enlarged. No increase in right ventricular wall thickness. Right ventricular systolic function is severely reduced. There is mildly elevated pulmonary artery systolic pressure. The tricuspid regurgitant velocity is 2.88 m/s, and with an assumed right atrial pressure of 8 mmHg, the estimated right ventricular systolic pressure is [id number] mmHg. Left Atrium: Left atrial size was normal in size. Right Atrium:  Right atrial size was normal in size. Pericardium:  A small pericardial effusion is present. The pericardial effusion is circumferential. Mitral Valve: The mitral valve is normal in structure. There is mild thickening of the mitral valve leaflet(s). Trivial mitral valve regurgitation. No evidence of mitral valve stenosis. MV peak gradient, 8.0 mmHg. The mean mitral valve gradient is 2.0 mmHg. Tricuspid Valve: The tricuspid valve is normal in structure. Tricuspid valve regurgitation is mild to moderate. No evidence of tricuspid stenosis. Aortic Valve: The aortic valve is normal in structure. Aortic valve regurgitation is not visualized. No aortic stenosis is present. Aortic valve mean gradient measures 6.0 mmHg. Aortic valve peak gradient measures 8.8 mmHg. Aortic valve area, by VTI measures 2.01 cm. Pulmonic Valve: The pulmonic valve was normal in structure. Pulmonic valve regurgitation is trivial. No evidence of pulmonic stenosis. Aorta: The aortic root is normal in size and structure. Venous: The inferior vena cava is normal in size with greater than 50% respiratory variability, suggesting right atrial pressure of 3 mmHg. IAS/Shunts: No atrial level shunt detected by color flow Doppler.  LEFT VENTRICLE PLAX 2D LVIDd:         4.71 cm  Diastology LVIDs:         3.45 cm  LV e' medial:    0.13 cm/s LV PW:         1.26 cm  LV E/e' medial:  11.1 LV IVS:        1.24 cm  LV e' lateral:   0.10 cm/s LVOT diam:     2.00 cm  LV E/e' lateral: 14.5 LV SV:         68 LV SV Index:   27 LVOT Area:     3.14 cm  RIGHT VENTRICLE RV Basal diam:  4.05 cm RV Mid diam:    4.65 cm LEFT ATRIUM             Index       RIGHT ATRIUM           Index LA diam:        4.50 cm 1.79 cm/m  RA Area:     18.50 cm LA Vol (A2C):   37.9 ml 15.05 ml/m RA Volume:   49.20 ml  19.53 ml/m LA Vol (A4C):   52.5 ml 20.84 ml/m LA Biplane Vol: 46.5 ml 18.46 ml/m  AORTIC VALVE AV Area (Vmax):    2.29 cm AV Area (Vmean):   1.99 cm AV Area (VTI):     2.01  cm AV Vmax:           148.00 cm/s AV Vmean:          119.000 cm/s AV VTI:            0.339 m AV Peak Grad:      8.8 mmHg AV Mean Grad:      6.0 mmHg LVOT Vmax:         108.00 cm/s LVOT Vmean:        75.400 cm/s LVOT VTI:          0.217 m LVOT/AV VTI ratio: 0.64  AORTA Ao Root diam: 3.20 cm Ao Asc diam:  2.80 cm MITRAL VALVE               TRICUSPID VALVE MV Area (PHT): 5.66 cm    TR Peak grad:   33.2 mmHg MV Area VTI:   2.53 cm    TR Vmax:        288.00 cm/s MV Peak grad:  8.0 mmHg MV  Mean grad:  2.0 mmHg    SHUNTS MV Vmax:       1.41 m/s    Systemic VTI:  0.22 m MV Vmean:      60.2 cm/s   Systemic Diam: 2.00 cm MV Decel Time: 134 msec MV E velocity: 1.45 cm/s MV A velocity: 65.30 cm/s MV E/A ratio:  0.02 Fransico Him MD Electronically signed by Fransico Him MD Signature Date/Time: 11/26/2020/12:32:16 PM    Final     Orson Eva, DO  [facility]  If 7PM-7AM, please contact night-coverage [email or web address] Password [email or web address] 12/04/2020, 5:19 PM   LOS: 9 days

## 2020-12-05 LAB — RENAL FUNCTION PANEL
Albumin: 2.3 g/dL — ABNORMAL LOW (ref 3.5–5.0)
Anion gap: 9 (ref 5–15)
BUN: 51 mg/dL — ABNORMAL HIGH (ref 6–20)
CO2: 26 mmol/L (ref 22–32)
Calcium: 7.6 mg/dL — ABNORMAL LOW (ref 8.9–10.3)
Chloride: 99 mmol/L (ref 98–111)
Creatinine, Ser: 5.43 mg/dL — ABNORMAL HIGH (ref 0.44–1.00)
GFR, Estimated: 10 mL/min — ABNORMAL LOW (ref >60)
Glucose, Bld: 137 mg/dL — ABNORMAL HIGH (ref 70–99)
Phosphorus: 4.8 mg/dL — ABNORMAL HIGH (ref 2.5–4.6)
Potassium: 3.8 mmol/L (ref 3.5–5.1)
Sodium: 134 mmol/L — ABNORMAL LOW (ref 135–145)

## 2020-12-05 LAB — GLUCOSE, CAPILLARY
Glucose-Capillary: 132 mg/dL — ABNORMAL HIGH (ref 70–99)
Glucose-Capillary: 129 mg/dL — ABNORMAL HIGH (ref 70–99)
Glucose-Capillary: 133 mg/dL — ABNORMAL HIGH (ref 70–99)
Glucose-Capillary: 171 mg/dL — ABNORMAL HIGH (ref 70–99)
Glucose-Capillary: 156 mg/dL — ABNORMAL HIGH (ref 70–99)

## 2020-12-05 MED ORDER — DARBEPOETIN ALFA 200 MCG/0.4ML IJ SOSY
200.0000 ug | PREFILLED_SYRINGE | INTRAMUSCULAR | Status: DC
  Administered 2020-12-06: 200 ug via SUBCUTANEOUS
  Filled 2020-12-05: qty 0.4

## 2020-12-05 NOTE — Progress Notes (Signed)
Patient ID: Sheila Austin, female   DOB: 1985/10/20, 35 y.o.   MRN: [id number] S: No new complaints. O:BP (!) 155/86 (BP Location: Left Arm)   Pulse 72   Temp 98 F (36.7 C) (Oral)   Resp 16   Ht '5\' 2"'$  (1.575 m)   Wt (!) 150.2 kg   LMP  (Within Weeks)   SpO2 98%   BMI 60.56 kg/m   Intake/Output Summary (Last 24 hours) at 12/05/2020 0921 Last data filed at 12/05/2020 0850 Gross per 24 hour  Intake 480 ml  Output 2400 ml  Net -1920 ml   Intake/Output: I/O last 3 completed shifts: In: 81 [P.O.:630] Out: 3900 [Urine:3900]  Intake/Output this shift:  Total I/O In: 240 [P.O.:240] Out: -  Weight change: -3.9 kg Gen: NAD CVS: RRR Resp: CTA Abd: +BS, soft, NT Ext:1+ BLE, s/p RBKA  Recent Labs  Lab 11/29/20 0432 11/30/20 0434 12/01/20 0333 12/02/20 0540 12/03/20 0500 12/04/20 0228 12/05/20 0529  NA 135 135 135 136 135 136 134*  K 5.2* 5.0 4.7 4.1 3.5 3.8 3.8  CL 110 108 106 105 101 100 99  CO2 15* 17* 21* 20* '26 27 26  '$ GLUCOSE 97 81 117* 137* 133* 141* 137*  BUN 76* 78* 69* 59* 43* 45* 51*  CREATININE 6.86* 6.87* 6.56* 5.52* 4.50* 4.92* 5.43*  ALBUMIN 2.1* 2.0* 2.1* 2.2* 2.2* 2.2* 2.3*  CALCIUM 6.5* 6.6* 6.8* 7.0* 7.4* 7.7* 7.6*  PHOS 7.9* 7.6* 7.0* 5.4* 4.2 4.4 4.8*   Liver Function Tests: Recent Labs  Lab 12/03/20 0500 12/04/20 0228 12/05/20 0529  ALBUMIN 2.2* 2.2* 2.3*   No results for input(s): LIPASE, AMYLASE in the last 168 hours. No results for input(s): AMMONIA in the last 168 hours. CBC: Recent Labs  Lab 11/30/20 0434 12/01/20 2010  WBC 8.3 8.5  HGB 7.0* 7.3*  HCT 22.8* 22.9*  MCV 95.8 94.2  PLT 169 196   Cardiac Enzymes: No results for input(s): CKTOTAL, CKMB, CKMBINDEX, TROPONINI in the last 168 hours. CBG: Recent Labs  Lab 12/03/20 2138 12/04/20 0725 12/04/20 1112 12/04/20 2107 12/05/20 0738  GLUCAP 152* 125* 142* 164* 129*    Iron Studies: No results for input(s): IRON, TIBC, TRANSFERRIN, FERRITIN in the last 72  hours. Studies/Results: No results found.  calcium acetate  1,334 mg Oral TID WC   carvedilol  25 mg Oral BID   Chlorhexidine Gluconate Cloth  6 each Topical Daily   darbepoetin (ARANESP) injection - NON-DIALYSIS  200 mcg Subcutaneous Q Mon-1800   hydrALAZINE  50 mg Oral TID   insulin aspart  0-5 Units Subcutaneous QHS   insulin aspart  0-6 Units Subcutaneous TID WC   insulin glargine-yfgn  4 Units Subcutaneous QHS   isosorbide mononitrate  30 mg Oral Daily   levETIRAcetam  500 mg Oral BID   metolazone  10 mg Oral Daily   polyethylene glycol  17 g Oral Daily   senna  2 tablet Oral Daily   sodium bicarbonate  650 mg Oral TID   tamsulosin  0.4 mg Oral QPC supper    BMET    Component Value Date/Time   NA 134 (L) 12/05/2020 0529   K 3.8 12/05/2020 0529   CL 99 12/05/2020 0529   CO2 26 12/05/2020 0529   GLUCOSE 137 (H) 12/05/2020 0529   BUN 51 (H) 12/05/2020 0529   CREATININE 5.43 (H) 12/05/2020 0529   CREATININE 0.64 09/26/2015 1616   CALCIUM 7.6 (L) 12/05/2020 0529   GFRNONAA 10 (  L) 12/05/2020 0529   GFRNONAA >89 09/26/2015 1616   GFRAA 33 (L) 11/20/2019 1906   GFRAA >89 09/26/2015 1616   CBC    Component Value Date/Time   WBC 8.5 12/01/2020 2010   RBC 2.43 (L) 12/01/2020 2010   HGB 7.3 (L) 12/01/2020 2010   HCT 22.9 (L) 12/01/2020 2010   PLT 196 12/01/2020 2010   MCV 94.2 12/01/2020 2010   MCV 85.4 06/26/2017 1446   MCH 30.0 12/01/2020 2010   MCHC 31.9 12/01/2020 2010   RDW 14.8 12/01/2020 2010   LYMPHSABS 0.6 (L) 10/20/2020 0704   MONOABS 0.5 10/20/2020 0704   EOSABS 0.0 10/20/2020 0704   BASOSABS 0.1 10/20/2020 0704     Assessment/Plan:  New ESRD - due to poorly controlled DM and HTN.  S/p [facility] placement 7/26 with Dr. Donnetta Hutching and started HD on 11/30/20.  Tolerating HD well and will continue to UF as tolerated.  Awaiting outpatient HD schedule.  Plan for HD today and follow.  HTN/Volume - continue to UF as tolerated with HD.  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   Donetta Potts, MD [employer] [phone]

## 2020-12-05 NOTE — Progress Notes (Signed)
   NEPHROLOGY NURSING NOTE:  Due to high ESRD patient census and after discussion with Dr. Marval Regal, pt's dialysis treatment has been rescheduled for tomorrow.  Hennie Duos, RN

## 2020-12-05 NOTE — Progress Notes (Signed)
PROGRESS NOTE  Sheila Austin [id number] DOB: 1985/05/24 DOA: 11/25/2020 PCP: Pcp, No  Brief History:  As per H&P written by Dr. Clearence Ped on 11/26/20 35 y.o. female,with hx of CHF, DMII, HTN, GERD, morbid obesity, and chronic kidney disease presents to the ED with a chief complaint of peripheral edema.  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  She reports that she has a bariatric wheelchair at home, but she and her fianc live in a mobile home.  There are parts of the home, including the bathroom, where the bariatric wheelchair cannot access.  Patient usually crawls to the bathroom.  She reports that she has had increased swelling and peripheral edema, but she really started to notice it today, because she could not bend her knee to crawl on the floor.  She reports she can even get off the couch.  She also notes that she has had a decrease in urine output.  The symptoms have been associated with dyspnea on exertion, orthopnea, wheeze, and palpitations that last minutes.  Her symptoms are worse with exertion, better with rest.  She reports no cough.  She does have 2 L nasal cannula prescribed at home to wear 24/7.  She says she wears it at night and then she wears it when she needs it.  She has not been wearing it today.  Patient denies any fevers.  She has not had any dysuria or hematuria.  Patient is not on dialysis but her nephrologist in [place] has discussed this with her.  Dialysis discussion started in May 2022 and patient has been lost to follow-up since then as she does not have transport to get to her nephrologist in [place].    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 -appreciate nephrology -continue IV Lasix and metolazone--neg 26.6L -vascular surgery consulted and placed [facility] 11/29/20. -continue sodium  bicarbonate -d/c daily Lokelma now that patient is on HD -first HD 7/27 -HD 7/28 and 7/29 -next HD 8/2   hypertension -In the setting of fluid overload most likely -Continue max dose of IV diuretics -Continue coreg, hydralazine, imdur -anticipate improvement with HD -Norvasc has been discontinued. -Low-sodium/renal diet discussed with patient. -increased coreg to 25 mg bid   type 2 diabetes with nephropathy -Continue sliding scale insulin and follow CBGs fluctuation. -09/24/20 A1C--5.9 -continue lantus -continue novolog sliding scale   Left wrist pain/edema -xray--no fractures -venous duplex LUE--no DVT -suspect muscle strain from patient using hands/wrists to crawl on floor -overall improving   anemia of chronic kidney disease -iron saturation 16, ferritin 164 -IV iron therapy and Epogen as per nephrology discretion. -Last hemoglobin check 7.3; will repeat CBC and follow hemoglobin trend. -No overt bleeding appreciated..   secondary hyperparathyroidism/hypocalcemia -Calcium gluconate given on 11/26/2020; repeat calcium today 6.4 but corrected for her hypoalbuminemia 8.0. -Continue monitoring electrolytes trend  -PhosLo has been added -Follow electrolytes trend.   super morbid obesity -Body mass index is 69.72 kg/m. -Lifestyle modification   S/p R-BKA -PT eval>>SNF           Status is: Inpatient   Remains inpatient appropriate because:Persistent severe electrolyte disturbances   Dispo: The patient is from: Home              Anticipated d/c is to: SNF              Patient currently is  not medically stable to d/c.              Difficult to place patient No               Family Communication:   no Family at bedside   Consultants:  renal, vascular surgery   Code Status:  FULL   DVT Prophylaxis:  [place] Heparin     Procedures: As Listed in Progress Note Above   Antibiotics: None      Subjective: Patient denies fevers, chills, headache, chest  pain, dyspnea, nausea, vomiting, diarrhea, abdominal pain, dysuria, hematuria, hematochezia, and melena.   Objective: Vitals:   12/04/20 2146 12/05/20 0610 12/05/20 0950 12/05/20 1241  BP: (!) 152/84 (!) 155/86 136/80 (!) 184/93  Pulse: 81 72 75 76  Resp: '18 16  16  '$ Temp: 97.9 F (36.6 C) 98 F (36.7 C)  97.6 F (36.4 C)  TempSrc: Oral Oral  Oral  SpO2: 96% 98%  96%  Weight:  (!) 150.2 kg    Height:        Intake/Output Summary (Last 24 hours) at 12/05/2020 1750 Last data filed at 12/05/2020 1709 Gross per 24 hour  Intake 480 ml  Output 2750 ml  Net -2270 ml   Weight change: -3.9 kg Exam:  General:  Pt is alert, follows commands appropriately, not in acute distress HEENT: No icterus, No thrush, No neck mass, [place]/AT Cardiovascular: RRR, S1/S2, no rubs, no gallops Respiratory: CTA bilaterally, no wheezing, no crackles, no rhonchi Abdomen: Soft/+BS, non tender, non distended, no guarding Extremities: 2 + LE edema, No lymphangitis, No petechiae, No rashes, no synovitis   Data Reviewed: I have personally reviewed following labs and imaging studies Basic Metabolic Panel: Recent Labs  Lab 12/01/20 0333 12/02/20 0540 12/03/20 0500 12/04/20 0228 12/05/20 0529  NA 135 136 135 136 134*  K 4.7 4.1 3.5 3.8 3.8  CL 106 105 101 100 99  CO2 21* 20* '26 27 26  '$ GLUCOSE 117* 137* 133* 141* 137*  BUN 69* 59* 43* 45* 51*  CREATININE 6.56* 5.52* 4.50* 4.92* 5.43*  CALCIUM 6.8* 7.0* 7.4* 7.7* 7.6*  PHOS 7.0* 5.4* 4.2 4.4 4.8*   Liver Function Tests: Recent Labs  Lab 12/01/20 0333 12/02/20 0540 12/03/20 0500 12/04/20 0228 12/05/20 0529  ALBUMIN 2.1* 2.2* 2.2* 2.2* 2.3*   No results for input(s): LIPASE, AMYLASE in the last 168 hours. No results for input(s): AMMONIA in the last 168 hours. Coagulation Profile: No results for input(s): INR, PROTIME in the last 168 hours. CBC: Recent Labs  Lab 11/30/20 0434 12/01/20 2010  WBC 8.3 8.5  HGB 7.0* 7.3*  HCT 22.8* 22.9*  MCV  95.8 94.2  PLT 169 196   Cardiac Enzymes: No results for input(s): CKTOTAL, CKMB, CKMBINDEX, TROPONINI in the last 168 hours. BNP: Invalid input(s): POCBNP CBG: Recent Labs  Lab 12/04/20 1610 12/04/20 2107 12/05/20 0738 12/05/20 1114 12/05/20 1637  GLUCAP 132* 164* 129* 133* 171*   HbA1C: No results for input(s): HGBA1C in the last 72 hours. Urine analysis:    Component Value Date/Time   COLORURINE YELLOW 11/25/2020 2037   APPEARANCEUR CLEAR 11/25/2020 2037   LABSPEC 1.011 11/25/2020 2037   PHURINE 6.0 11/25/2020 2037   GLUCOSEU 150 (A) 11/25/2020 2037   HGBUR SMALL (A) 11/25/2020 2037   BILIRUBINUR NEGATIVE 11/25/2020 2037   BILIRUBINUR negative 08/13/2017 1622   KETONESUR NEGATIVE 11/25/2020 2037   PROTEINUR >=300 (A) 11/25/2020 2037   UROBILINOGEN 0.2 08/13/2017 1622  UROBILINOGEN 0.2 11/25/2014 0553   NITRITE NEGATIVE 11/25/2020 2037   LEUKOCYTESUR NEGATIVE 11/25/2020 2037   Sepsis Labs: '@LABRCNTIP'$ (procalcitonin:4,lacticidven:4) ) Recent Results (from the past 240 hour(s))  SARS CORONAVIRUS 2 (Shermar Friedland 6-24 HRS) Nasopharyngeal Nasopharyngeal Swab     Status: None   Collection Time: 11/25/20  8:00 PM   Specimen: Nasopharyngeal Swab  Result Value Ref Range Status   SARS Coronavirus 2 NEGATIVE NEGATIVE Final    Comment: (NOTE) SARS-CoV-2 target nucleic acids are NOT DETECTED.  The SARS-CoV-2 RNA is generally detectable in upper and lower respiratory specimens during the acute phase of infection. Negative results do not preclude SARS-CoV-2 infection, do not rule out co-infections with other pathogens, and should not be used as the sole basis for treatment or other patient management decisions. Negative results must be combined with clinical observations, patient history, and epidemiological information. The expected result is Negative.  Fact Sheet for Patients: SugarRoll.be  Fact Sheet for Healthcare  Providers: https://www.woods-mathews.com/  This test is not yet approved or cleared by the [place] FDA and  has been authorized for detection and/or diagnosis of SARS-CoV-2 by FDA under an Emergency Use Authorization (EUA). This EUA will remain  in effect (meaning this test can be used) for the duration of the COVID-19 declaration under Se ction 564(b)(1) of the Act, 21 U.S.C. section 360bbb-3(b)(1), unless the authorization is terminated or revoked sooner.  Performed at [facility], [place] [address] [place], [place] [address]   Surgical pcr screen     Status: Abnormal   Collection Time: 11/28/20 10:11 PM   Specimen: Nasal Mucosa; Nasal Swab  Result Value Ref Range Status   MRSA, PCR POSITIVE (A) NEGATIVE Final    Comment: RESULT CALLED TO, READ BACK BY AND VERIFIED WITH: WATLINGTON,J'@0307'$  BY MATTHEWS, B 7.26.22    Staphylococcus aureus POSITIVE (A) NEGATIVE Final    Comment: RESULT CALLED TO, READ BACK BY AND VERIFIED WITH: WATLINGTON,J'@0307'$  BY MATTHEWS, B 7.26.22 (NOTE) The Xpert SA Assay (FDA approved for NASAL specimens in patients 88 years of age and older), is one component of a comprehensive surveillance program. It is not intended to diagnose infection nor to guide or monitor treatment. Performed at [facility], [address], [place], [place] [address]      Scheduled Meds:  calcium acetate  1,334 mg Oral TID WC   carvedilol  25 mg Oral BID   Chlorhexidine Gluconate Cloth  6 each Topical Daily   [START ON 12/06/2020] darbepoetin (ARANESP) injection - NON-DIALYSIS  200 mcg Subcutaneous Q Tue-1800   hydrALAZINE  50 mg Oral TID   insulin aspart  0-5 Units Subcutaneous QHS   insulin aspart  0-6 Units Subcutaneous TID WC   insulin glargine-yfgn  4 Units Subcutaneous QHS   isosorbide mononitrate  30 mg Oral Daily   levETIRAcetam  500 mg Oral BID   metolazone  10 mg Oral Daily   polyethylene glycol  17 g Oral Daily   senna  2 tablet Oral Daily    sodium bicarbonate  650 mg Oral TID   tamsulosin  0.4 mg Oral QPC supper   Continuous Infusions:  sodium chloride     sodium chloride     furosemide 160 mg (12/05/20 1120)    Procedures/Studies: DG Wrist Complete Left  Result Date: 12/01/2020 CLINICAL DATA:  35 year old female with left wrist pain. EXAM: LEFT WRIST - COMPLETE 3+ VIEW COMPARISON:  None. FINDINGS: There is no acute fracture or dislocation. The bones are well mineralized. No  arthritic changes. There is diffuse subcutaneous edema and soft tissue swelling. Vascular calcifications noted. IMPRESSION: 1. No acute fracture or dislocation. 2. Diffuse subcutaneous edema and vascular calcification. Electronically Signed   By: Anner Crete M.D.   On: 12/01/2020 20:21   US Venous Img Upper Uni Left (DVT)  Result Date: 12/02/2020 CLINICAL DATA:  35 year old female with a history of left wrist pain EXAM: LEFT UPPER EXTREMITY VENOUS DOPPLER ULTRASOUND TECHNIQUE: Gray-scale sonography with graded compression, as well as color Doppler and duplex ultrasound were performed to evaluate the upper extremity deep venous system from the level of the subclavian vein and including the jugular, axillary, basilic, radial, ulnar and upper cephalic vein. Spectral Doppler was utilized to evaluate flow at rest and with distal augmentation maneuvers. COMPARISON:  None. FINDINGS: Contralateral Subclavian Vein: Respiratory phasicity is normal and symmetric with the symptomatic side. No evidence of thrombus. Normal compressibility. Internal Jugular Vein: No evidence of thrombus. Normal compressibility, respiratory phasicity and response to augmentation. Subclavian Vein: No evidence of thrombus. Normal compressibility, respiratory phasicity and response to augmentation. Axillary Vein: No evidence of thrombus. Normal compressibility, respiratory phasicity and response to augmentation. Cephalic Vein: No evidence of thrombus. Normal compressibility, respiratory phasicity  and response to augmentation. Basilic Vein: No evidence of thrombus. Normal compressibility, respiratory phasicity and response to augmentation. Brachial Veins: No evidence of thrombus. Normal compressibility, respiratory phasicity and response to augmentation. Radial Veins: No evidence of thrombus. Normal compressibility, respiratory phasicity and response to augmentation. Ulnar Veins: No evidence of thrombus. Normal compressibility, respiratory phasicity and response to augmentation. Other Findings:  Edema IMPRESSION: Sonographic survey of the left upper extremity negative for DVT Edema Electronically Signed   By: Corrie Mckusick D.O.   On: 12/02/2020 11:40   DG Chest Port 1 View  Result Date: 11/29/2020 CLINICAL DATA:  Status post dialysis catheter placement. EXAM: PORTABLE CHEST 1 VIEW COMPARISON:  11/26/2020 FINDINGS: Stable cardiac enlargement. Interval placement of right jugular tunneled dialysis catheter with catheter tip overlying the right atrium. No pneumothorax. Slight decrease in pulmonary vascular congestion. No pleural effusions. IMPRESSION: Dialysis catheter tip overlies the right atrium. No pneumothorax. Slight decrease in pulmonary vascular congestion. Electronically Signed   By: Aletta Edouard M.D.   On: 11/29/2020 16:17   Portable chest 1 View  Result Date: 11/26/2020 CLINICAL DATA:  Pleural effusion. EXAM: PORTABLE CHEST 1 VIEW COMPARISON:  November 25, 2020. FINDINGS: Stable cardiomediastinal silhouette. Mild central pulmonary vascular congestion may be present. Right basilar opacity is noted concerning for possible atelectasis and effusion. Bony thorax is unremarkable. IMPRESSION: Mild central pulmonary vascular congestion. Right basilar opacity is noted concerning for possible atelectasis and pleural effusion. Electronically Signed   By: Marijo Conception M.D.   On: 11/26/2020 10:10   DG Chest Portable 1 View  Result Date: 11/25/2020 CLINICAL DATA:  Edema. The patient states generalized  weakness and swelling to lower limbs in the last 4 days. States SOB on exertion/ slight movement. Hx of diabetes, pulmonary edema, CHF, and HTN. Not being tested for covid-19 EXAM: PORTABLE CHEST 1 VIEW.  Patient is rotated. COMPARISON:  CT abdomen pelvis 10/20/2020 FINDINGS: Enlarged cardiac silhouette. The heart size and mediastinal contours are otherwise unchanged. Again noted is slightly more lucent left hemithorax compared to the right. No focal consolidation. Mild pulmonary edema. No pneumothorax. No acute osseous abnormality. IMPRESSION: 1. Bilateral pleural effusions that are difficult to evaluate on this AP semi upright view. Given opacity overlying the right hemithorax, there is a likely larger than  trace right pleural effusion. Recommend PA and lateral view of the chest for further evaluation given size of effusions on CT chest 10/20/20. 2. Persistent enlargement of the cardiac silhouette. Underlying pericardial effusion not excluded. 3. Mild pulmonary edema. Electronically Signed   By: Iven Finn M.D.   On: 11/25/2020 19:11   DG C-Arm 1-60 Min-No Report  Result Date: 11/29/2020 Fluoroscopy was utilized by the requesting physician.  No radiographic interpretation.   ECHOCARDIOGRAM COMPLETE  Result Date: 11/26/2020    ECHOCARDIOGRAM REPORT   Patient Name:   JAI OVERFIELD Date of Exam: 11/26/2020 Medical Rec #:  [id number]        Height:       62.0 in Accession #:    [id number]       Weight:       382.7 lb Date of Birth:  02-17-1986        BSA:          2.519 m Patient Age:    58 years         BP:           181/96 mmHg Patient Gender: F                HR:           80 bpm. Exam Location:  [facility] Procedure: 2D Echo, Cardiac Doppler and Color Doppler Indications:    CHF  History:        Patient has prior history of Echocardiogram examinations, most                 recent 09/24/2020. CHF; Risk Factors:Hypertension, Diabetes and                 Dyslipidemia.  Sonographer:    Wenda Low  Referring Phys: [id number] ASIA B [place]  1. Left ventricular ejection fraction, by estimation, is 60 to 65%. The left ventricle has normal function. The left ventricle has no regional wall motion abnormalities. There is mild concentric left ventricular hypertrophy. Left ventricular diastolic parameters are indeterminate.  2. Prominent moderator band present. Right ventricular systolic function is severely reduced. The right ventricular size is severely enlarged. There is mildly elevated pulmonary artery systolic pressure.  3. A small pericardial effusion is present. The pericardial effusion is circumferential.  4. The mitral valve is normal in structure. Trivial mitral valve regurgitation. No evidence of mitral stenosis.  5. Tricuspid valve regurgitation is mild to moderate.  6. The aortic valve is normal in structure. Aortic valve regurgitation is not visualized. No aortic stenosis is present.  7. The inferior vena cava is normal in size with greater than 50% respiratory variability, suggesting right atrial pressure of 3 mmHg. FINDINGS  Left Ventricle: Left ventricular ejection fraction, by estimation, is 60 to 65%. The left ventricle has normal function. The left ventricle has no regional wall motion abnormalities. The left ventricular internal cavity size was normal in size. There is  mild concentric left ventricular hypertrophy. Left ventricular diastolic parameters are indeterminate. Normal left ventricular filling pressure. Right Ventricle: Prominent moderator band present. The right ventricular size is severely enlarged. No increase in right ventricular wall thickness. Right ventricular systolic function is severely reduced. There is mildly elevated pulmonary artery systolic pressure. The tricuspid regurgitant velocity is 2.88 m/s, and with an assumed right atrial pressure of 8 mmHg, the estimated right ventricular systolic pressure is [id number] mmHg. Left Atrium: Left atrial size was normal in  size. Right Atrium: Right atrial  size was normal in size. Pericardium: A small pericardial effusion is present. The pericardial effusion is circumferential. Mitral Valve: The mitral valve is normal in structure. There is mild thickening of the mitral valve leaflet(s). Trivial mitral valve regurgitation. No evidence of mitral valve stenosis. MV peak gradient, 8.0 mmHg. The mean mitral valve gradient is 2.0 mmHg. Tricuspid Valve: The tricuspid valve is normal in structure. Tricuspid valve regurgitation is mild to moderate. No evidence of tricuspid stenosis. Aortic Valve: The aortic valve is normal in structure. Aortic valve regurgitation is not visualized. No aortic stenosis is present. Aortic valve mean gradient measures 6.0 mmHg. Aortic valve peak gradient measures 8.8 mmHg. Aortic valve area, by VTI measures 2.01 cm. Pulmonic Valve: The pulmonic valve was normal in structure. Pulmonic valve regurgitation is trivial. No evidence of pulmonic stenosis. Aorta: The aortic root is normal in size and structure. Venous: The inferior vena cava is normal in size with greater than 50% respiratory variability, suggesting right atrial pressure of 3 mmHg. IAS/Shunts: No atrial level shunt detected by color flow Doppler.  LEFT VENTRICLE PLAX 2D LVIDd:         4.71 cm  Diastology LVIDs:         3.45 cm  LV e' medial:    0.13 cm/s LV PW:         1.26 cm  LV E/e' medial:  11.1 LV IVS:        1.24 cm  LV e' lateral:   0.10 cm/s LVOT diam:     2.00 cm  LV E/e' lateral: 14.5 LV SV:         68 LV SV Index:   27 LVOT Area:     3.14 cm  RIGHT VENTRICLE RV Basal diam:  4.05 cm RV Mid diam:    4.65 cm LEFT ATRIUM             Index       RIGHT ATRIUM           Index LA diam:        4.50 cm 1.79 cm/m  RA Area:     18.50 cm LA Vol (A2C):   37.9 ml 15.05 ml/m RA Volume:   49.20 ml  19.53 ml/m LA Vol (A4C):   52.5 ml 20.84 ml/m LA Biplane Vol: 46.5 ml 18.46 ml/m  AORTIC VALVE AV Area (Vmax):    2.29 cm AV Area (Vmean):   1.99 cm AV  Area (VTI):     2.01 cm AV Vmax:           148.00 cm/s AV Vmean:          119.000 cm/s AV VTI:            0.339 m AV Peak Grad:      8.8 mmHg AV Mean Grad:      6.0 mmHg LVOT Vmax:         108.00 cm/s LVOT Vmean:        75.400 cm/s LVOT VTI:          0.217 m LVOT/AV VTI ratio: 0.64  AORTA Ao Root diam: 3.20 cm Ao Asc diam:  2.80 cm MITRAL VALVE               TRICUSPID VALVE MV Area (PHT): 5.66 cm    TR Peak grad:   33.2 mmHg MV Area VTI:   2.53 cm    TR Vmax:        288.00 cm/s MV  Peak grad:  8.0 mmHg MV Mean grad:  2.0 mmHg    SHUNTS MV Vmax:       1.41 m/s    Systemic VTI:  0.22 m MV Vmean:      60.2 cm/s   Systemic Diam: 2.00 cm MV Decel Time: 134 msec MV E velocity: 1.45 cm/s MV A velocity: 65.30 cm/s MV E/A ratio:  0.02 Fransico Him MD Electronically signed by Fransico Him MD Signature Date/Time: 11/26/2020/12:32:16 PM    Final     Orson Eva, DO  [facility]  If 7PM-7AM, please contact night-coverage [email or web address] Password [email or web address] 12/05/2020, 5:50 PM   LOS: 10 days

## 2020-12-05 NOTE — Progress Notes (Signed)
Physical Therapy Treatment Patient Details Name: Sheila Austin MRN: [id number] DOB: 02/16/1986 Today's Date: 12/05/2020    History of Present Illness Sheila Austin  is a 35 y.o. female,with hx of CHF, DMII, HTN, GERD, morbid obesity, and chronic kidney disease presents to the ED with a chief complaint of peripheral edema.  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  She reports that she has a bariatric wheelchair at home, but she and her fianc live in a mobile home.  There are parts of the home, including the bathroom, where the bariatric wheelchair cannot access.  Patient usually crawls to the bathroom.  She reports that she has had increased swelling and peripheral edema, but she really started to notice it today, because she could not bend her knee to crawl on the floor.  She reports she can even get off the couch.  She also notes that she has had a decrease in urine output.  The symptoms have been associated with dyspnea on exertion, orthopnea, wheeze, and palpitations that last minutes.  Her symptoms are worse with exertion, better with rest.  She reports no cough.  She does have 2 L nasal cannula prescribed at home to wear 24/7.  She says she wears it at night and then she wears it when she needs it.  She has not been wearing it today.  Patient denies any fevers.  She has not had any dysuria or hematuria.  Patient is not on dialysis but her nephrologist in [place] has discussed this with her.  Dialysis discussion started in May 2022 and patient has been lost to follow-up since then as she does not have transport to get to her nephrologist in [place].  The discussion seem to be around peritoneal dialysis per patient's description.    PT Comments    Patient agreeable and motivated for therapy and states she is able to flex left knee more due to losing water weight since admission.  Patient demonstrates labored movement for sitting up at bedside with  fair/good sitting balance, good return for completing BLE ROM/strengthening exercises, attempt sit to stands x 4 trials but unable to stand due to LLE, BUE weakness.  Patient states she can feels BUE, LLE muscles working during attempts to stand and demonstrates good return for repositioning herself during bed mobility.  Patient will benefit from continued physical therapy in hospital and recommended venue below to increase strength, balance, endurance for safe ADLs and gait.     Follow Up Recommendations  SNF;Supervision for mobility/OOB;Supervision - Intermittent     Equipment Recommendations  None recommended by PT    Recommendations for Other Services       Precautions / Restrictions Precautions Precautions: Fall Restrictions Weight Bearing Restrictions: No    Mobility  Bed Mobility Overal bed mobility: Needs Assistance Bed Mobility: Supine to Sit;Sit to Supine     Supine to sit: Supervision Sit to supine: Supervision   General bed mobility comments: increased time, labored movement, required use of bed rail    Transfers                    Ambulation/Gait                 Stairs             Wheelchair Mobility    Modified Rankin (Stroke Patients Only)       Balance Overall balance assessment: Needs assistance Sitting-balance support: Feet supported;No upper  extremity supported Sitting balance-Leahy Scale: Fair Sitting balance - Comments: fair/good supporting self with BUE seated at EOB                                    Cognition Arousal/Alertness: Awake/alert Behavior During Therapy: WFL for tasks assessed/performed Overall Cognitive Status: Within Functional Limits for tasks assessed                                        Exercises General Exercises - Lower Extremity Long Arc Quad: Seated;AROM;Strengthening;Both;15 reps Hip Flexion/Marching: Seated;AROM;Strengthening;Both;15 reps Toe Raises:  Seated;AROM;Strengthening;Both;15 reps Heel Raises: Seated;AROM;Strengthening;Both;15 reps    General Comments        Pertinent Vitals/Pain Pain Assessment: No/denies pain    Home Living                      Prior Function            PT Goals (current goals can now be found in the care plan section) Acute Rehab PT Goals Patient Stated Goal: return home with fiance to assist PT Goal Formulation: With patient Time For Goal Achievement: 12/14/20 Potential to Achieve Goals: Good Progress towards PT goals: Not progressing toward goals - comment    Frequency    Min 3X/week      PT Plan Current plan remains appropriate    Co-evaluation              AM-PAC PT "6 Clicks" Mobility   Outcome Measure  Help needed turning from your back to your side while in a flat bed without using bedrails?: None Help needed moving from lying on your back to sitting on the side of a flat bed without using bedrails?: A Little Help needed moving to and from a bed to a chair (including a wheelchair)?: Total Help needed standing up from a chair using your arms (e.g., wheelchair or bedside chair)?: Total Help needed to walk in hospital room?: Total Help needed climbing 3-5 steps with a railing? : Total 6 Click Score: 11    End of Session   Activity Tolerance: Patient tolerated treatment well;Patient limited by fatigue Patient left: in bed;with call bell/phone within reach Nurse Communication: Mobility status PT Visit Diagnosis: Unsteadiness on feet (R26.81);Other abnormalities of gait and mobility (R26.89);Muscle weakness (generalized) (M62.81)     Time: [id number] PT Time Calculation (min) (ACUTE ONLY): 21 min  Charges:  $Therapeutic Exercise: 8-22 mins $Therapeutic Activity: 8-22 mins                     12:34 PM, 12/05/20 Lonell Grandchild, MPT Physical Therapist with [facility] [phone] office [phone] mobile phone

## 2020-12-06 LAB — RENAL FUNCTION PANEL
Albumin: 2.3 g/dL — ABNORMAL LOW (ref 3.5–5.0)
Anion gap: 10 (ref 5–15)
BUN: 55 mg/dL — ABNORMAL HIGH (ref 6–20)
CO2: 26 mmol/L (ref 22–32)
Calcium: 7.6 mg/dL — ABNORMAL LOW (ref 8.9–10.3)
Chloride: 100 mmol/L (ref 98–111)
Creatinine, Ser: 5.91 mg/dL — ABNORMAL HIGH (ref 0.44–1.00)
GFR, Estimated: 9 mL/min — ABNORMAL LOW (ref >60)
Glucose, Bld: 132 mg/dL — ABNORMAL HIGH (ref 70–99)
Phosphorus: 5.1 mg/dL — ABNORMAL HIGH (ref 2.5–4.6)
Potassium: 4 mmol/L (ref 3.5–5.1)
Sodium: 136 mmol/L (ref 135–145)

## 2020-12-06 LAB — GLUCOSE, CAPILLARY
Glucose-Capillary: 108 mg/dL — ABNORMAL HIGH (ref 70–99)
Glucose-Capillary: 158 mg/dL — ABNORMAL HIGH (ref 70–99)
Glucose-Capillary: 140 mg/dL — ABNORMAL HIGH (ref 70–99)
Glucose-Capillary: 150 mg/dL — ABNORMAL HIGH (ref 70–99)

## 2020-12-06 LAB — CBC
HCT: 25.5 % — ABNORMAL LOW (ref 36.0–46.0)
Hemoglobin: 7.7 g/dL — ABNORMAL LOW (ref 12.0–15.0)
MCH: 29.2 pg (ref 26.0–34.0)
MCHC: 30.2 g/dL (ref 30.0–36.0)
MCV: 96.6 fL (ref 80.0–100.0)
Platelets: 231 10*3/uL (ref 150–400)
RBC: 2.64 MIL/uL — ABNORMAL LOW (ref 3.87–5.11)
RDW: 15.5 % (ref 11.5–15.5)
WBC: 9.3 10*3/uL (ref 4.0–10.5)
nRBC: 0 % (ref 0.0–0.2)

## 2020-12-06 NOTE — Progress Notes (Signed)
Patient ID: [name], female   DOB: 1985/12/20, 35 y.o.   MRN: [id number] S: No new complaints. Hd moved to today given high inpatient census. O:BP 134/69 (BP Location: Left Arm)   Pulse 76   Temp 98.5 F (36.9 C) (Oral)   Resp 17   Ht '5\' 2"'$  (1.575 m)   Wt (!) 153.1 kg   LMP  (Within Weeks)   SpO2 92%   BMI 61.73 kg/m   Intake/Output Summary (Last 24 hours) at 12/06/2020 0802 Last data filed at 12/06/2020 0245 Gross per 24 hour  Intake 840 ml  Output 1350 ml  Net -510 ml   Intake/Output: I/O last 3 completed shifts: In: 840 [P.O.:840] Out: 2850 [Urine:2850]  Intake/Output this shift:  No intake/output data recorded. Weight change: 2.9 kg Gen: NAD CVS: RRR Resp: CTA Abd: +BS, soft, NT Ext:1+ BLE, s/p RBKA Neuro: awake, alert, speech clear and coherent HD access: rij tdc  Recent Labs  Lab 11/30/20 0434 12/01/20 0333 12/02/20 0540 12/03/20 0500 12/04/20 0228 12/05/20 0529 12/06/20 0434  NA 135 135 136 135 136 134* 136  K 5.0 4.7 4.1 3.5 3.8 3.8 4.0  CL 108 106 105 101 100 99 100  CO2 17* 21* 20* '26 27 26 26  '$ GLUCOSE 81 117* 137* 133* 141* 137* 132*  BUN 78* 69* 59* 43* 45* 51* 55*  CREATININE 6.87* 6.56* 5.52* 4.50* 4.92* 5.43* 5.91*  ALBUMIN 2.0* 2.1* 2.2* 2.2* 2.2* 2.3* 2.3*  CALCIUM 6.6* 6.8* 7.0* 7.4* 7.7* 7.6* 7.6*  PHOS 7.6* 7.0* 5.4* 4.2 4.4 4.8* 5.1*   Liver Function Tests: Recent Labs  Lab 12/04/20 0228 12/05/20 0529 12/06/20 0434  ALBUMIN 2.2* 2.3* 2.3*   No results for input(s): LIPASE, AMYLASE in the last 168 hours. No results for input(s): AMMONIA in the last 168 hours. CBC: Recent Labs  Lab 11/30/20 0434 12/01/20 2010 12/06/20 0434  WBC 8.3 8.5 9.3  HGB 7.0* 7.3* 7.7*  HCT 22.8* 22.9* 25.5*  MCV 95.8 94.2 96.6  PLT 169 196 231   Cardiac Enzymes: No results for input(s): CKTOTAL, CKMB, CKMBINDEX, TROPONINI in the last 168 hours. CBG: Recent Labs  Lab 12/05/20 0738 12/05/20 1114 12/05/20 1637 12/05/20 2132 12/06/20 0741   GLUCAP 129* 133* 171* 156* 108*    Iron Studies: No results for input(s): IRON, TIBC, TRANSFERRIN, FERRITIN in the last 72 hours. Studies/Results: No results found.  calcium acetate  1,334 mg Oral TID WC   carvedilol  25 mg Oral BID   Chlorhexidine Gluconate Cloth  6 each Topical Daily   darbepoetin (ARANESP) injection - NON-DIALYSIS  200 mcg Subcutaneous Q Tue-1800   hydrALAZINE  50 mg Oral TID   insulin aspart  0-5 Units Subcutaneous QHS   insulin aspart  0-6 Units Subcutaneous TID WC   insulin glargine-yfgn  4 Units Subcutaneous QHS   isosorbide mononitrate  30 mg Oral Daily   levETIRAcetam  500 mg Oral BID   metolazone  10 mg Oral Daily   polyethylene glycol  17 g Oral Daily   senna  2 tablet Oral Daily   sodium bicarbonate  650 mg Oral TID   tamsulosin  0.4 mg Oral QPC supper    BMET    Component Value Date/Time   NA 136 12/06/2020 0434   K 4.0 12/06/2020 0434   CL 100 12/06/2020 0434   CO2 26 12/06/2020 0434   GLUCOSE 132 (H) 12/06/2020 0434   BUN 55 (H) 12/06/2020 0434   CREATININE 5.91 (  H) 12/06/2020 0434   CREATININE 0.64 09/26/2015 1616   CALCIUM 7.6 (L) 12/06/2020 0434   GFRNONAA 9 (L) 12/06/2020 0434   GFRNONAA >89 09/26/2015 1616   GFRAA 33 (L) 11/20/2019 1906   GFRAA >89 09/26/2015 1616   CBC    Component Value Date/Time   WBC 9.3 12/06/2020 0434   RBC 2.64 (L) 12/06/2020 0434   HGB 7.7 (L) 12/06/2020 0434   HCT 25.5 (L) 12/06/2020 0434   PLT 231 12/06/2020 0434   MCV 96.6 12/06/2020 0434   MCV 85.4 06/26/2017 1446   MCH 29.2 12/06/2020 0434   MCHC 30.2 12/06/2020 0434   RDW 15.5 12/06/2020 0434   LYMPHSABS 0.6 (L) 10/20/2020 0704   MONOABS 0.5 10/20/2020 0704   EOSABS 0.0 10/20/2020 0704   BASOSABS 0.1 10/20/2020 0704     Assessment/Plan:  New ESRD - due to poorly controlled DM and HTN.  S[facility] 7/26 with Dr. Donnetta Hutching and started HD on 11/30/20.  Tolerating HD well and will continue to UF as tolerated.  Awaiting outpatient HD  schedule.  Plan for HD today (tentatively) and follow. HD schedule this week dependent on inpatient census HTN/Volume - continue to UF as tolerated with HD. 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   Gean Quint, MD [facility]

## 2020-12-06 NOTE — Progress Notes (Signed)
Physical Therapy Treatment Patient Details Name: TYJAH MULCARE MRN: [id number] DOB: [birth year] Today's Date: 12/06/2020    History of Present Illness Natalia Fryling  is a 35 y.o. female,with hx of CHF, DMII, HTN, GERD, morbid obesity, and chronic kidney disease presents to the ED with a chief complaint of peripheral edema.  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  She reports that she has a bariatric wheelchair at home, but she and her fianc live in a mobile home.  There are parts of the home, including the bathroom, where the bariatric wheelchair cannot access.  Patient usually crawls to the bathroom.  She reports that she has had increased swelling and peripheral edema, but she really started to notice it today, because she could not bend her knee to crawl on the floor.  She reports she can even get off the couch.  She also notes that she has had a decrease in urine output.  The symptoms have been associated with dyspnea on exertion, orthopnea, wheeze, and palpitations that last minutes.  Her symptoms are worse with exertion, better with rest.  She reports no cough.  She does have 2 L nasal cannula prescribed at home to wear 24/7.  She says she wears it at night and then she wears it when she needs it.  She has not been wearing it today.  Patient denies any fevers.  She has not had any dysuria or hematuria.  Patient is not on dialysis but her nephrologist in [place] has discussed this with her.  Dialysis discussion started in May 2022 and patient has been lost to follow-up since then as she does not have transport to get to her nephrologist in [place].  The discussion seem to be around peritoneal dialysis per patient's description.    PT Comments    Patient demonstrates labored movement sitting up at bedside with [facility] raised, good return for scooting laterally to left/right at bedside and good return for transferring to/from chair scooting over  without requiring use of sliding board.  Patient required frequent rest breaks during transfer to chair secondary to c/o fatigue and tolerated sitting up at bedside after therapy - LPN, NT aware.  Patient will benefit from continued physical therapy in hospital and recommended venue below to increase strength, balance, endurance for safe ADLs and gait.     Follow Up Recommendations  SNF;Supervision for mobility/OOB;Supervision - Intermittent     Equipment Recommendations  None recommended by PT    Recommendations for Other Services       Precautions / Restrictions Precautions Precautions: Fall Restrictions Weight Bearing Restrictions: No    Mobility  Bed Mobility Overal bed mobility: Needs Assistance Bed Mobility: Supine to Sit;Sit to Supine     Supine to sit: Supervision;Independent Sit to supine: Supervision   General bed mobility comments: increased time, labored movement, required HOB raised    Transfers Overall transfer level: Needs assistance   Transfers: Lateral/Scoot Transfers          Lateral/Scoot Transfers: Min guard;Min assist General transfer comment: increased time, labored movement, frequent rest breaks due to fatigue  Ambulation/Gait                 Stairs             Wheelchair Mobility    Modified Rankin (Stroke Patients Only)       Balance Overall balance assessment: Needs assistance Sitting-balance support: Feet supported;No upper extremity supported Sitting balance-Leahy Scale: Fair  Sitting balance - Comments: fair/good supporting self with BUE seated at EOB                                    Cognition Arousal/Alertness: Awake/alert Behavior During Therapy: WFL for tasks assessed/performed Overall Cognitive Status: Within Functional Limits for tasks assessed                                        Exercises General Exercises - Lower Extremity Long Arc Quad:  Seated;AROM;Strengthening;Both;15 reps Hip Flexion/Marching: Seated;AROM;Strengthening;Both;10 reps Toe Raises: Seated;AROM;Strengthening;Both;15 reps Heel Raises: Seated;AROM;Strengthening;Both;15 reps    General Comments        Pertinent Vitals/Pain Pain Assessment: No/denies pain    Home Living                      Prior Function            PT Goals (current goals can now be found in the care plan section) Acute Rehab PT Goals Patient Stated Goal: return home with fiance to assist PT Goal Formulation: With patient Time For Goal Achievement: 12/14/20 Potential to Achieve Goals: Good Progress towards PT goals: Progressing toward goals    Frequency    Min 3X/week      PT Plan Current plan remains appropriate    Co-evaluation              AM-PAC PT "6 Clicks" Mobility   Outcome Measure  Help needed turning from your back to your side while in a flat bed without using bedrails?: None Help needed moving from lying on your back to sitting on the side of a flat bed without using bedrails?: A Little Help needed moving to and from a bed to a chair (including a wheelchair)?: A Little Help needed standing up from a chair using your arms (e.g., wheelchair or bedside chair)?: Total Help needed to walk in hospital room?: Total Help needed climbing 3-5 steps with a railing? : Total 6 Click Score: 13    End of Session   Activity Tolerance: Patient tolerated treatment well;Patient limited by fatigue Patient left: in bed;with call bell/phone within reach (seated at bedside) Nurse Communication: Mobility status PT Visit Diagnosis: Unsteadiness on feet (R26.81);Other abnormalities of gait and mobility (R26.89);Muscle weakness (generalized) (M62.81)     Time: [id number] PT Time Calculation (min) (ACUTE ONLY): 26 min  Charges:  $Therapeutic Exercise: 8-22 mins $Therapeutic Activity: 8-22 mins                     3:56 PM, 12/06/20 Lonell Grandchild,  MPT Physical Therapist with [facility] [phone] office [phone] mobile phone

## 2020-12-06 NOTE — Progress Notes (Signed)
PROGRESS NOTE  Sheila Austin [id number] DOB: 27-Nov-1985 DOA: 11/25/2020 PCP: Pcp, No  Brief History:  As per H&P written by Dr. Clearence Ped on 11/26/20 35 y.o. female,with hx of CHF, DMII, HTN, GERD, morbid obesity, and chronic kidney disease presents to the ED with a chief complaint of peripheral edema.  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  She reports that she has a bariatric wheelchair at home, but she and her fianc live in a mobile home.  There are parts of the home, including the bathroom, where the bariatric wheelchair cannot access.  Patient usually crawls to the bathroom.  She reports that she has had increased swelling and peripheral edema, but she really started to notice it today, because she could not bend her knee to crawl on the floor.  She reports she can even get off the couch.  She also notes that she has had a decrease in urine output.  The symptoms have been associated with dyspnea on exertion, orthopnea, wheeze, and palpitations that last minutes.  Her symptoms are worse with exertion, better with rest.  She reports no cough.  She does have 2 L nasal cannula prescribed at home to wear 24/7.  She says she wears it at night and then she wears it when she needs it.  She has not been wearing it today.  Patient denies any fevers.  She has not had any dysuria or hematuria.  Patient is not on dialysis but her nephrologist in [place] has discussed this with her.  Dialysis discussion started in May 2022 and patient has been lost to follow-up since then as she does not have transport to get to her nephrologist in [place].    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 -appreciate nephrology -continue IV Lasix and metolazone--neg 26.6L -vascular surgery consulted and placed [facility] 11/29/20. -continue sodium  bicarbonate -d/c daily Lokelma now that patient is on HD -first HD 7/27 -HD 7/28 and 7/29 -next HD 8/2 -lasix 80 mg po bid on NON-dialysis days after d/c--discussed with Dr. Candiss Norse   hypertension -In the setting of fluid overload most likely -Continue max dose of IV diuretics -Continue coreg, hydralazine, imdur -Norvasc has been discontinued. -Low-sodium/renal diet discussed with patient. -increased coreg to 25 mg bid -anticipate continued improvement with HD   type 2 diabetes with nephropathy -Continue sliding scale insulin and follow CBGs fluctuation. -09/24/20 A1C--5.9 -continue lantus -continue novolog sliding scale   Left wrist pain/edema -xray--no fractures -venous duplex LUE--no DVT -suspect muscle strain from patient using hands/wrists to crawl on floor -overall improving   anemia of chronic kidney disease -iron saturation 16, ferritin 164 -IV iron therapy and Epogen as per nephrology discretion. -Last hemoglobin check 7.3; will repeat CBC and follow hemoglobin trend. -No overt bleeding appreciated..   secondary hyperparathyroidism/hypocalcemia -Calcium gluconate given on 11/26/2020; repeat calcium today 6.4 but corrected for her hypoalbuminemia 8.0. -Continue monitoring electrolytes trend  -PhosLo has been added -Follow electrolytes trend.   super morbid obesity -Body mass index is 69.72 kg/m. -Lifestyle modification   S/p R-BKA -PT eval>>SNF           Status is: Inpatient   Remains inpatient appropriate because:Persistent severe electrolyte disturbances   Dispo: The patient is from: Home              Anticipated d/c is to: SNF  Patient currently is not medically stable to d/c.              Difficult to place patient No               Family Communication:   no Family at bedside   Consultants:  renal, vascular surgery   Code Status:  FULL   DVT Prophylaxis:  [place] Heparin     Procedures: As Listed in Progress Note Above    Antibiotics: None        Subjective: Patient denies fevers, chills, headache, chest pain, dyspnea, nausea, vomiting, diarrhea, abdominal pain, dysuria, hematuria, hematochezia, and melena.   Objective: Vitals:   12/05/20 2100 12/06/20 0440 12/06/20 0831 12/06/20 1233  BP: (!) 155/87 134/69 (!) 177/86 (!) 161/93  Pulse: 81 76  81  Resp: '19 17  18  '$ Temp: 98 F (36.7 C) 98.5 F (36.9 C)  98.2 F (36.8 C)  TempSrc: Oral Oral  Oral  SpO2: 98% 92%  92%  Weight:  (!) 153.1 kg    Height:        Intake/Output Summary (Last 24 hours) at 12/06/2020 1830 Last data filed at 12/06/2020 1700 Gross per 24 hour  Intake 600 ml  Output 2200 ml  Net -1600 ml   Weight change: 2.9 kg Exam:  General:  Pt is alert, follows commands appropriately, not in acute distress HEENT: No icterus, No thrush, No neck mass, [place]/AT Cardiovascular: RRR, S1/S2, no rubs, no gallops Respiratory: CTA bilaterally, no wheezing, no crackles, no rhonchi Abdomen: Soft/+BS, non tender, non distended, no guarding Extremities: 2 +LE edema, No lymphangitis, No petechiae, No rashes, no synovitis   Data Reviewed: I have personally reviewed following labs and imaging studies Basic Metabolic Panel: Recent Labs  Lab 12/02/20 0540 12/03/20 0500 12/04/20 0228 12/05/20 0529 12/06/20 0434  NA 136 135 136 134* 136  K 4.1 3.5 3.8 3.8 4.0  CL 105 101 100 99 100  CO2 20* '26 27 26 26  '$ GLUCOSE 137* 133* 141* 137* 132*  BUN 59* 43* 45* 51* 55*  CREATININE 5.52* 4.50* 4.92* 5.43* 5.91*  CALCIUM 7.0* 7.4* 7.7* 7.6* 7.6*  PHOS 5.4* 4.2 4.4 4.8* 5.1*   Liver Function Tests: Recent Labs  Lab 12/02/20 0540 12/03/20 0500 12/04/20 0228 12/05/20 0529 12/06/20 0434  ALBUMIN 2.2* 2.2* 2.2* 2.3* 2.3*   No results for input(s): LIPASE, AMYLASE in the last 168 hours. No results for input(s): AMMONIA in the last 168 hours. Coagulation Profile: No results for input(s): INR, PROTIME in the last 168 hours. CBC: Recent Labs   Lab 11/30/20 0434 12/01/20 2010 12/06/20 0434  WBC 8.3 8.5 9.3  HGB 7.0* 7.3* 7.7*  HCT 22.8* 22.9* 25.5*  MCV 95.8 94.2 96.6  PLT 169 196 231   Cardiac Enzymes: No results for input(s): CKTOTAL, CKMB, CKMBINDEX, TROPONINI in the last 168 hours. BNP: Invalid input(s): POCBNP CBG: Recent Labs  Lab 12/05/20 1637 12/05/20 2132 12/06/20 0741 12/06/20 1114 12/06/20 1609  GLUCAP 171* 156* 108* 158* 140*   HbA1C: No results for input(s): HGBA1C in the last 72 hours. Urine analysis:    Component Value Date/Time   COLORURINE YELLOW 11/25/2020 2037   APPEARANCEUR CLEAR 11/25/2020 2037   LABSPEC 1.011 11/25/2020 2037   PHURINE 6.0 11/25/2020 2037   GLUCOSEU 150 (A) 11/25/2020 2037   HGBUR SMALL (A) 11/25/2020 2037   BILIRUBINUR NEGATIVE 11/25/2020 2037   BILIRUBINUR negative 08/13/2017 1622   KETONESUR NEGATIVE 11/25/2020 2037   PROTEINUR >=300 (  A) 11/25/2020 2037   UROBILINOGEN 0.2 08/13/2017 1622   UROBILINOGEN 0.2 11/25/2014 0553   NITRITE NEGATIVE 11/25/2020 2037   LEUKOCYTESUR NEGATIVE 11/25/2020 2037   Sepsis Labs: '@LABRCNTIP'$ (procalcitonin:4,lacticidven:4) ) Recent Results (from the past 240 hour(s))  Surgical pcr screen     Status: Abnormal   Collection Time: 11/28/20 10:11 PM   Specimen: Nasal Mucosa; Nasal Swab  Result Value Ref Range Status   MRSA, PCR POSITIVE (A) NEGATIVE Final    Comment: RESULT CALLED TO, READ BACK BY AND VERIFIED WITH: WATLINGTON,J'@0307'$  BY MATTHEWS, B 7.26.22    Staphylococcus aureus POSITIVE (A) NEGATIVE Final    Comment: RESULT CALLED TO, READ BACK BY AND VERIFIED WITH: WATLINGTON,J'@0307'$  BY MATTHEWS, B 7.26.22 (NOTE) The Xpert SA Assay (FDA approved for NASAL specimens in patients 42 years of age and older), is one component of a comprehensive surveillance program. It is not intended to diagnose infection nor to guide or monitor treatment. Performed at [facility], [address] [place], [place] [address]      Scheduled  Meds:  calcium acetate  1,334 mg Oral TID WC   carvedilol  25 mg Oral BID   Chlorhexidine Gluconate Cloth  6 each Topical Daily   darbepoetin (ARANESP) injection - NON-DIALYSIS  200 mcg Subcutaneous Q Tue-1800   hydrALAZINE  50 mg Oral TID   insulin aspart  0-5 Units Subcutaneous QHS   insulin aspart  0-6 Units Subcutaneous TID WC   insulin glargine-yfgn  4 Units Subcutaneous QHS   isosorbide mononitrate  30 mg Oral Daily   levETIRAcetam  500 mg Oral BID   metolazone  10 mg Oral Daily   polyethylene glycol  17 g Oral Daily   senna  2 tablet Oral Daily   sodium bicarbonate  650 mg Oral TID   tamsulosin  0.4 mg Oral QPC supper   Continuous Infusions:  sodium chloride     sodium chloride     furosemide 160 mg (12/06/20 1724)    Procedures/Studies: DG Wrist Complete Left  Result Date: 12/01/2020 CLINICAL DATA:  35 year old female with left wrist pain. EXAM: LEFT WRIST - COMPLETE 3+ VIEW COMPARISON:  None. FINDINGS: There is no acute fracture or dislocation. The bones are well mineralized. No arthritic changes. There is diffuse subcutaneous edema and soft tissue swelling. Vascular calcifications noted. IMPRESSION: 1. No acute fracture or dislocation. 2. Diffuse subcutaneous edema and vascular calcification. Electronically Signed   By: Anner Crete M.D.   On: 12/01/2020 20:21   US Venous Img Upper Uni Left (DVT)  Result Date: 12/02/2020 CLINICAL DATA:  35 year old female with a history of left wrist pain EXAM: LEFT UPPER EXTREMITY VENOUS DOPPLER ULTRASOUND TECHNIQUE: Gray-scale sonography with graded compression, as well as color Doppler and duplex ultrasound were performed to evaluate the upper extremity deep venous system from the level of the subclavian vein and including the jugular, axillary, basilic, radial, ulnar and upper cephalic vein. Spectral Doppler was utilized to evaluate flow at rest and with distal augmentation maneuvers. COMPARISON:  None. FINDINGS: Contralateral  Subclavian Vein: Respiratory phasicity is normal and symmetric with the symptomatic side. No evidence of thrombus. Normal compressibility. Internal Jugular Vein: No evidence of thrombus. Normal compressibility, respiratory phasicity and response to augmentation. Subclavian Vein: No evidence of thrombus. Normal compressibility, respiratory phasicity and response to augmentation. Axillary Vein: No evidence of thrombus. Normal compressibility, respiratory phasicity and response to augmentation. Cephalic Vein: No evidence of thrombus. Normal compressibility, respiratory phasicity and response to augmentation. Basilic Vein: No evidence  of thrombus. Normal compressibility, respiratory phasicity and response to augmentation. Brachial Veins: No evidence of thrombus. Normal compressibility, respiratory phasicity and response to augmentation. Radial Veins: No evidence of thrombus. Normal compressibility, respiratory phasicity and response to augmentation. Ulnar Veins: No evidence of thrombus. Normal compressibility, respiratory phasicity and response to augmentation. Other Findings:  Edema IMPRESSION: Sonographic survey of the left upper extremity negative for DVT Edema Electronically Signed   By: Corrie Mckusick D.O.   On: 12/02/2020 11:40   DG Chest Port 1 View  Result Date: 11/29/2020 CLINICAL DATA:  Status post dialysis catheter placement. EXAM: PORTABLE CHEST 1 VIEW COMPARISON:  11/26/2020 FINDINGS: Stable cardiac enlargement. Interval placement of right jugular tunneled dialysis catheter with catheter tip overlying the right atrium. No pneumothorax. Slight decrease in pulmonary vascular congestion. No pleural effusions. IMPRESSION: Dialysis catheter tip overlies the right atrium. No pneumothorax. Slight decrease in pulmonary vascular congestion. Electronically Signed   By: Aletta Edouard M.D.   On: 11/29/2020 16:17   Portable chest 1 View  Result Date: 11/26/2020 CLINICAL DATA:  Pleural effusion. EXAM: PORTABLE  CHEST 1 VIEW COMPARISON:  November 25, 2020. FINDINGS: Stable cardiomediastinal silhouette. Mild central pulmonary vascular congestion may be present. Right basilar opacity is noted concerning for possible atelectasis and effusion. Bony thorax is unremarkable. IMPRESSION: Mild central pulmonary vascular congestion. Right basilar opacity is noted concerning for possible atelectasis and pleural effusion. Electronically Signed   By: Marijo Conception M.D.   On: 11/26/2020 10:10   DG Chest Portable 1 View  Result Date: 11/25/2020 CLINICAL DATA:  Edema. The patient states generalized weakness and swelling to lower limbs in the last 4 days. States SOB on exertion/ slight movement. Hx of diabetes, pulmonary edema, CHF, and HTN. Not being tested for covid-19 EXAM: PORTABLE CHEST 1 VIEW.  Patient is rotated. COMPARISON:  CT abdomen pelvis 10/20/2020 FINDINGS: Enlarged cardiac silhouette. The heart size and mediastinal contours are otherwise unchanged. Again noted is slightly more lucent left hemithorax compared to the right. No focal consolidation. Mild pulmonary edema. No pneumothorax. No acute osseous abnormality. IMPRESSION: 1. Bilateral pleural effusions that are difficult to evaluate on this AP semi upright view. Given opacity overlying the right hemithorax, there is a likely larger than trace right pleural effusion. Recommend PA and lateral view of the chest for further evaluation given size of effusions on CT chest 10/20/20. 2. Persistent enlargement of the cardiac silhouette. Underlying pericardial effusion not excluded. 3. Mild pulmonary edema. Electronically Signed   By: Iven Finn M.D.   On: 11/25/2020 19:11   DG C-Arm 1-60 Min-No Report  Result Date: 11/29/2020 Fluoroscopy was utilized by the requesting physician.  No radiographic interpretation.   ECHOCARDIOGRAM COMPLETE  Result Date: 11/26/2020    ECHOCARDIOGRAM REPORT   Patient Name:   Sheila Austin Date of Exam: 11/26/2020 Medical Rec #:   [id number]        Height:       62.0 in Accession #:    [id number]       Weight:       382.7 lb Date of Birth:  1985-11-19        BSA:          2.519 m Patient Age:    34 years         BP:           181/96 mmHg Patient Gender: F                HR:  80 bpm. Exam Location:  [facility] Procedure: 2D Echo, Cardiac Doppler and Color Doppler Indications:    CHF  History:        Patient has prior history of Echocardiogram examinations, most                 recent 09/24/2020. CHF; Risk Factors:Hypertension, Diabetes and                 Dyslipidemia.  Sonographer:    Wenda Low Referring Phys: [id number] ASIA B [place]  1. Left ventricular ejection fraction, by estimation, is 60 to 65%. The left ventricle has normal function. The left ventricle has no regional wall motion abnormalities. There is mild concentric left ventricular hypertrophy. Left ventricular diastolic parameters are indeterminate.  2. Prominent moderator band present. Right ventricular systolic function is severely reduced. The right ventricular size is severely enlarged. There is mildly elevated pulmonary artery systolic pressure.  3. A small pericardial effusion is present. The pericardial effusion is circumferential.  4. The mitral valve is normal in structure. Trivial mitral valve regurgitation. No evidence of mitral stenosis.  5. Tricuspid valve regurgitation is mild to moderate.  6. The aortic valve is normal in structure. Aortic valve regurgitation is not visualized. No aortic stenosis is present.  7. The inferior vena cava is normal in size with greater than 50% respiratory variability, suggesting right atrial pressure of 3 mmHg. FINDINGS  Left Ventricle: Left ventricular ejection fraction, by estimation, is 60 to 65%. The left ventricle has normal function. The left ventricle has no regional wall motion abnormalities. The left ventricular internal cavity size was normal in size. There is  mild concentric left ventricular  hypertrophy. Left ventricular diastolic parameters are indeterminate. Normal left ventricular filling pressure. Right Ventricle: Prominent moderator band present. The right ventricular size is severely enlarged. No increase in right ventricular wall thickness. Right ventricular systolic function is severely reduced. There is mildly elevated pulmonary artery systolic pressure. The tricuspid regurgitant velocity is 2.88 m/s, and with an assumed right atrial pressure of 8 mmHg, the estimated right ventricular systolic pressure is [id number] mmHg. Left Atrium: Left atrial size was normal in size. Right Atrium: Right atrial size was normal in size. Pericardium: A small pericardial effusion is present. The pericardial effusion is circumferential. Mitral Valve: The mitral valve is normal in structure. There is mild thickening of the mitral valve leaflet(s). Trivial mitral valve regurgitation. No evidence of mitral valve stenosis. MV peak gradient, 8.0 mmHg. The mean mitral valve gradient is 2.0 mmHg. Tricuspid Valve: The tricuspid valve is normal in structure. Tricuspid valve regurgitation is mild to moderate. No evidence of tricuspid stenosis. Aortic Valve: The aortic valve is normal in structure. Aortic valve regurgitation is not visualized. No aortic stenosis is present. Aortic valve mean gradient measures 6.0 mmHg. Aortic valve peak gradient measures 8.8 mmHg. Aortic valve area, by VTI measures 2.01 cm. Pulmonic Valve: The pulmonic valve was normal in structure. Pulmonic valve regurgitation is trivial. No evidence of pulmonic stenosis. Aorta: The aortic root is normal in size and structure. Venous: The inferior vena cava is normal in size with greater than 50% respiratory variability, suggesting right atrial pressure of 3 mmHg. IAS/Shunts: No atrial level shunt detected by color flow Doppler.  LEFT VENTRICLE PLAX 2D LVIDd:         4.71 cm  Diastology LVIDs:         3.45 cm  LV e' medial:    0.13 cm/s LV PW:  1.26 cm   LV E/e' medial:  11.1 LV IVS:        1.24 cm  LV e' lateral:   0.10 cm/s LVOT diam:     2.00 cm  LV E/e' lateral: 14.5 LV SV:         68 LV SV Index:   27 LVOT Area:     3.14 cm  RIGHT VENTRICLE RV Basal diam:  4.05 cm RV Mid diam:    4.65 cm LEFT ATRIUM             Index       RIGHT ATRIUM           Index LA diam:        4.50 cm 1.79 cm/m  RA Area:     18.50 cm LA Vol (A2C):   37.9 ml 15.05 ml/m RA Volume:   49.20 ml  19.53 ml/m LA Vol (A4C):   52.5 ml 20.84 ml/m LA Biplane Vol: 46.5 ml 18.46 ml/m  AORTIC VALVE AV Area (Vmax):    2.29 cm AV Area (Vmean):   1.99 cm AV Area (VTI):     2.01 cm AV Vmax:           148.00 cm/s AV Vmean:          119.000 cm/s AV VTI:            0.339 m AV Peak Grad:      8.8 mmHg AV Mean Grad:      6.0 mmHg LVOT Vmax:         108.00 cm/s LVOT Vmean:        75.400 cm/s LVOT VTI:          0.217 m LVOT/AV VTI ratio: 0.64  AORTA Ao Root diam: 3.20 cm Ao Asc diam:  2.80 cm MITRAL VALVE               TRICUSPID VALVE MV Area (PHT): 5.66 cm    TR Peak grad:   33.2 mmHg MV Area VTI:   2.53 cm    TR Vmax:        288.00 cm/s MV Peak grad:  8.0 mmHg MV Mean grad:  2.0 mmHg    SHUNTS MV Vmax:       1.41 m/s    Systemic VTI:  0.22 m MV Vmean:      60.2 cm/s   Systemic Diam: 2.00 cm MV Decel Time: 134 msec MV E velocity: 1.45 cm/s MV A velocity: 65.30 cm/s MV E/A ratio:  0.02 Fransico Him MD Electronically signed by Fransico Him MD Signature Date/Time: 11/26/2020/12:32:16 PM    Final     Orson Eva, DO  [facility]  If 7PM-7AM, please contact night-coverage [email or web address] Password [email or web address] 12/06/2020, 6:30 PM   LOS: 11 days

## 2020-12-06 NOTE — Progress Notes (Signed)
   NEPHROLOGY NURSING NOTE:  Phone call received from Dr. Candiss Norse.  Pt will be on MWF outpatient HD schedule.  Due to high ESRD pt census, [name]'s next HD session will be tomorrow, Wednesday 8/3.  We are still waiting for insurance authorization for rehab.  Sheila Alexandria, RN

## 2020-12-06 NOTE — Progress Notes (Signed)
Patient ID: Sheila Austin, female   DOB: 19-Nov-1985, 35 y.o.   MRN: [id number] Feels much better.  Doing well with her right IJ tunneled hemodialysis catheter.  She had vein map orders written but this has not been done yet.  I spoke with her nurse.  Will need bilateral upper extremity vein mapping.  Did have a rule out DVT study due to wrist soreness.  This does not tell us about her surface veins.  She is left-handed.  I discussed both AV fistula and AV graft placement which can be done as an outpatient.  I am not available for inpatient surgery until 1 week from today.  Will not follow actively.  Will follow in her chart

## 2020-12-07 ENCOUNTER — Inpatient Hospital Stay (HOSPITAL_COMMUNITY): Payer: Medicaid Other

## 2020-12-07 LAB — RENAL FUNCTION PANEL
Albumin: 2.3 g/dL — ABNORMAL LOW (ref 3.5–5.0)
Anion gap: 12 (ref 5–15)
BUN: 62 mg/dL — ABNORMAL HIGH (ref 6–20)
CO2: 26 mmol/L (ref 22–32)
Calcium: 7.6 mg/dL — ABNORMAL LOW (ref 8.9–10.3)
Chloride: 100 mmol/L (ref 98–111)
Creatinine, Ser: 6.35 mg/dL — ABNORMAL HIGH (ref 0.44–1.00)
GFR, Estimated: 8 mL/min — ABNORMAL LOW (ref >60)
Glucose, Bld: 141 mg/dL — ABNORMAL HIGH (ref 70–99)
Phosphorus: 5.6 mg/dL — ABNORMAL HIGH (ref 2.5–4.6)
Potassium: 4.1 mmol/L (ref 3.5–5.1)
Sodium: 138 mmol/L (ref 135–145)

## 2020-12-07 LAB — GLUCOSE, CAPILLARY
Glucose-Capillary: 117 mg/dL — ABNORMAL HIGH (ref 70–99)
Glucose-Capillary: 131 mg/dL — ABNORMAL HIGH (ref 70–99)

## 2020-12-07 MED ORDER — SENNA 8.6 MG PO TABS: 2.0000 | ORAL_TABLET | Freq: Every day | ORAL | 0 refills | Status: AC

## 2020-12-07 MED ORDER — POLYETHYLENE GLYCOL 3350 17 G PO PACK: 17.0000 g | PACK | Freq: Every day | ORAL | 0 refills | Status: AC | PRN

## 2020-12-07 MED ORDER — CARVEDILOL 12.5 MG PO TABS: 25.0000 mg | ORAL_TABLET | Freq: Two times a day (BID) | ORAL | 3 refills | Status: AC

## 2020-12-07 MED ORDER — POLYETHYLENE GLYCOL 3350 17 G PO PACK: 17.0000 g | PACK | Freq: Every day | ORAL | 0 refills | Status: AC

## 2020-12-07 MED ORDER — FUROSEMIDE 80 MG PO TABS: 80.0000 mg | ORAL_TABLET | Freq: Two times a day (BID) | ORAL | 11 refills | Status: AC

## 2020-12-07 MED ORDER — CALCIUM ACETATE (PHOS BINDER) 667 MG PO CAPS: 1334.0000 mg | ORAL_CAPSULE | Freq: Three times a day (TID) | ORAL | Status: AC

## 2020-12-07 MED ORDER — OXYCODONE HCL 5 MG PO TABS: 5.0000 mg | ORAL_TABLET | ORAL | 0 refills | Status: AC | PRN

## 2020-12-07 MED ORDER — HYDRALAZINE HCL 100 MG PO TABS: 50.0000 mg | ORAL_TABLET | Freq: Three times a day (TID) | ORAL | 5 refills | Status: AC

## 2020-12-07 NOTE — Progress Notes (Signed)
Patient ID: Sheila Austin, female   DOB: 12/18/1985, 35 y.o.   MRN: [id number] S: No new complaints. Hd moved to today to keep her on MWF and high inpatient census O:BP (!) 148/74 (BP Location: Left Arm)   Pulse 83   Temp 98.8 F (37.1 C) (Oral)   Resp 15   Ht '5\' 2"'$  (1.575 m)   Wt (!) 151.7 kg   LMP  (Within Weeks)   SpO2 97%   BMI 61.17 kg/m   Intake/Output Summary (Last 24 hours) at 12/07/2020 0840 Last data filed at 12/07/2020 0112 Gross per 24 hour  Intake 600 ml  Output 700 ml  Net -100 ml   Intake/Output: I/O last 3 completed shifts: In: 960 [P.O.:960] Out: 2200 [Urine:2200]  Intake/Output this shift:  No intake/output data recorded. Weight change: -1.4 kg Gen: NAD CVS: RRR Resp: CTA Abd: +BS, soft, NT Ext:1+ BLE, s/p RBKA Neuro: awake, alert, speech clear and coherent HD access: rij tdc  Recent Labs  Lab 12/01/20 0333 12/02/20 0540 12/03/20 0500 12/04/20 0228 12/05/20 0529 12/06/20 0434 12/07/20 0445  NA 135 136 135 136 134* 136 138  K 4.7 4.1 3.5 3.8 3.8 4.0 4.1  CL 106 105 101 100 99 100 100  CO2 21* 20* '26 27 26 26 26  '$ GLUCOSE 117* 137* 133* 141* 137* 132* 141*  BUN 69* 59* 43* 45* 51* 55* 62*  CREATININE 6.56* 5.52* 4.50* 4.92* 5.43* 5.91* 6.35*  ALBUMIN 2.1* 2.2* 2.2* 2.2* 2.3* 2.3* 2.3*  CALCIUM 6.8* 7.0* 7.4* 7.7* 7.6* 7.6* 7.6*  PHOS 7.0* 5.4* 4.2 4.4 4.8* 5.1* 5.6*   Liver Function Tests: Recent Labs  Lab 12/05/20 0529 12/06/20 0434 12/07/20 0445  ALBUMIN 2.3* 2.3* 2.3*   No results for input(s): LIPASE, AMYLASE in the last 168 hours. No results for input(s): AMMONIA in the last 168 hours. CBC: Recent Labs  Lab 12/01/20 2010 12/06/20 0434  WBC 8.5 9.3  HGB 7.3* 7.7*  HCT 22.9* 25.5*  MCV 94.2 96.6  PLT 196 231   Cardiac Enzymes: No results for input(s): CKTOTAL, CKMB, CKMBINDEX, TROPONINI in the last 168 hours. CBG: Recent Labs  Lab 12/06/20 0741 12/06/20 1114 12/06/20 1609 12/06/20 2045 12/07/20 0737  GLUCAP 108*  158* 140* 150* 117*    Iron Studies: No results for input(s): IRON, TIBC, TRANSFERRIN, FERRITIN in the last 72 hours. Studies/Results: No results found.  calcium acetate  1,334 mg Oral TID WC   carvedilol  25 mg Oral BID   Chlorhexidine Gluconate Cloth  6 each Topical Daily   darbepoetin (ARANESP) injection - NON-DIALYSIS  200 mcg Subcutaneous Q Tue-1800   hydrALAZINE  50 mg Oral TID   insulin aspart  0-5 Units Subcutaneous QHS   insulin aspart  0-6 Units Subcutaneous TID WC   insulin glargine-yfgn  4 Units Subcutaneous QHS   isosorbide mononitrate  30 mg Oral Daily   levETIRAcetam  500 mg Oral BID   metolazone  10 mg Oral Daily   polyethylene glycol  17 g Oral Daily   senna  2 tablet Oral Daily   sodium bicarbonate  650 mg Oral TID   tamsulosin  0.4 mg Oral QPC supper    BMET    Component Value Date/Time   NA 138 12/07/2020 0445   K 4.1 12/07/2020 0445   CL 100 12/07/2020 0445   CO2 26 12/07/2020 0445   GLUCOSE 141 (H) 12/07/2020 0445   BUN 62 (H) 12/07/2020 0445   CREATININE 6.35 (H)  12/07/2020 0445   CREATININE 0.64 09/26/2015 1616   CALCIUM 7.6 (L) 12/07/2020 0445   GFRNONAA 8 (L) 12/07/2020 0445   GFRNONAA >89 09/26/2015 1616   GFRAA 33 (L) 11/20/2019 1906   GFRAA >89 09/26/2015 1616   CBC    Component Value Date/Time   WBC 9.3 12/06/2020 0434   RBC 2.64 (L) 12/06/2020 0434   HGB 7.7 (L) 12/06/2020 0434   HCT 25.5 (L) 12/06/2020 0434   PLT 231 12/06/2020 0434   MCV 96.6 12/06/2020 0434   MCV 85.4 06/26/2017 1446   MCH 29.2 12/06/2020 0434   MCHC 30.2 12/06/2020 0434   RDW 15.5 12/06/2020 0434   LYMPHSABS 0.6 (L) 10/20/2020 0704   MONOABS 0.5 10/20/2020 0704   EOSABS 0.0 10/20/2020 0704   BASOSABS 0.1 10/20/2020 0704     Assessment/Plan:  New ESRD - due to poorly controlled DM and HTN.  S/p [facility] placement 7/26 with Dr. Donnetta Hutching and started HD on 11/30/20.  Tolerating HD well and will continue to UF as tolerated.  Awaiting outpatient HD schedule.  Plan for  HD today. Plan to keep her mwf. Clip-m[facility] HTN/Volume - continue to UF as tolerated with HD. 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   Gean Quint, MD [facility]

## 2020-12-07 NOTE — Procedures (Signed)
   HEMODIALYSIS TREATMENT NOTE:  Uneventful 3 hour HD completed via R[facility] TDC.  Dressing changed; entry site is unremarkable.  Goal met: 3 liters removed.  All blood was returned.  No changes from pre-HD assessment.  Rockwell Alexandria, RN

## 2020-12-07 NOTE — Discharge Summary (Signed)
Physician Discharge Summary  Sheila Austin [id number] DOB: 1985/09/13 DOA: 11/25/2020  PCP: Pcp, No  Admit date: 11/25/2020 Discharge date: 12/07/2020  Admitted From: Home Disposition: Skilled nursing facility  Recommendations for Outpatient Follow-up:  Follow up with PCP in 1-2 weeks Please obtain BMP/CBC in one week Patient has been set up with outpatient dialysis center at [facility] on Monday, Wednesday, Friday at 12:15 Follow-up with vascular surgery to discuss placement of permanent dialysis access  Discharge Condition: Stable CODE STATUS: Full code Diet recommendation: Heart healthy, carb modified  Brief/Interim Summary: As per H&P written by Dr. Clearence Ped on 11/26/20 35 y.o. female,with hx of CHF, DMII, HTN, GERD, morbid obesity, and chronic kidney disease presents to the ED with a chief complaint of peripheral edema.  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  She reports that she has a bariatric wheelchair at home, but she and her fianc live in a mobile home.  There are parts of the home, including the bathroom, where the bariatric wheelchair cannot access.  Patient usually crawls to the bathroom.  She reports that she has had increased swelling and peripheral edema, but she really started to notice it today, because she could not bend her knee to crawl on the floor.  She reports she can even get off the couch.  She also notes that she has had a decrease in urine output.  The symptoms have been associated with dyspnea on exertion, orthopnea, wheeze, and palpitations that last minutes.  Her symptoms are worse with exertion, better with rest.  She reports no cough.  She does have 2 L nasal cannula prescribed at home to wear 24/7.  She says she wears it at night and then she wears it when she needs it.  She has not been wearing it today.  Patient denies any fevers.  She has not had any dysuria or hematuria.  Patient is not on dialysis but  her nephrologist in [place] has discussed this with her.  Dialysis discussion started in May 2022 and patient has been lost to follow-up since then as she does not have transport to get to her nephrologist in [place].     Discharge Diagnoses:  Principal Problem:   Acute renal failure (ARF) (HCC) Active Problems:   DM (diabetes mellitus) ([place])   Hyperkalemia   Benign essential HTN   Hypomagnesemia   Cardiorenal syndrome   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 chronic kidney disease, not on chronic dialysis ([place])   ESRD (end stage renal disease) ([place])   Anasarca associated with disorder of kidney   Morbid obesity with body mass index of 60.0-69.9 in adult [facility])  Acute on chronic renal failure--CKD5>>>NEW ESRD -patient presented with massive fluid overload, hyperkalemia and metabolic acidosis. -appreciate nephrology -continue IV Lasix and metolazone--neg 26.6L -vascular surgery consulted and placed [facility] 11/29/20. -continue sodium bicarbonate -d/c daily Lokelma now that patient is on HD -first HD 7/27 -HD 7/28 and 7/29, 8/3 -lasix 80 mg po bid on NON-dialysis days after d/c--discussed with Dr. Candiss Norse -She has been set up with an outpatient dialysis center for Monday, Wednesday, Friday dialysis -She will follow-up with Dr. Donnetta Hutching, vascular surgery to discuss permanent dialysis access   hypertension -In the setting of fluid overload most likely -Continue max dose of IV diuretics -Continue coreg, hydralazine, imdur -Norvasc has been discontinued. -Low-sodium/renal diet discussed with patient. -increased coreg to 25 mg bid -anticipate continued improvement with HD   type 2 diabetes with nephropathy -Continue sliding  scale insulin and follow CBGs fluctuation. -09/24/20 A1C--5.9 -continue lantus -continue novolog sliding scale   Left wrist pain/edema -xray--no fractures -venous duplex LUE--no DVT -suspect muscle strain from patient using hands/wrists to crawl on floor -overall improving    anemia of chronic kidney disease -iron saturation 16, ferritin 164 -IV iron therapy and Epogen as per nephrology discretion. -Last hemoglobin check 7.3; will repeat CBC and follow hemoglobin trend. -No overt bleeding appreciated..   secondary hyperparathyroidism/hypocalcemia -Calcium gluconate given on 11/26/2020; repeat calcium 6.4 but corrected for her hypoalbuminemia 8.0. -Continue monitoring electrolytes trend  -PhosLo has been added -Follow electrolytes trend.   super morbid obesity -Body mass index is 60.8 kg/m. -Lifestyle modification   S/p R-BKA -PT eval>>SNF  Discharge Instructions  Discharge Instructions     Diet - low sodium heart healthy   Complete by: As directed    Increase activity slowly   Complete by: As directed    No wound care   Complete by: As directed       Allergies as of 12/07/2020       Reactions   Bee Venom Anaphylaxis, Hives, Shortness Of Breath, Swelling   Penicillins Anaphylaxis, Itching   Has patient had a PCN reaction causing immediate rash, facial/tongue/throat swelling, SOB or lightheadedness with hypotension: yes Has patient had a PCN reaction causing severe rash involving mucus membranes or skin necrosis: unknown Has patient had a PCN reaction that required hospitalization : yes Has patient had a PCN reaction occurring within the last 10 years: no If all of the above answers are "NO", then may proceed with Cephalosporin use.   Lorazepam    Other reaction(s): Hallucinations   Lovenox [enoxaparin Sodium] Other (See Comments)   Hyperkalemia? GI bleed   Metoclopramide Other (See Comments)   Dystonia following metoclopramide injection        Medication List     STOP taking these medications    amLODipine 10 MG tablet Commonly known as: NORVASC   bumetanide 1 MG tablet Commonly known as: BUMEX   [employer] Zinc Gluconate 50 MG tablet Generic drug: zinc gluconate   ferrous sulfate 325 (65 FE) MG tablet   levETIRAcetam 500 MG  tablet Commonly known as: Keppra   metolazone 2.5 MG tablet Commonly known as: ZAROXOLYN       TAKE these medications    calcium acetate 667 MG capsule Commonly known as: PHOSLO Take 2 capsules (1,334 mg total) by mouth 3 (three) times daily with meals.   carvedilol 12.5 MG tablet Commonly known as: COREG Take 2 tablets (25 mg total) by mouth 2 (two) times daily. What changed: how much to take   cloNIDine 0.1 mg/24hr patch Commonly known as: CATAPRES - Dosed in mg/24 hr Place 1 patch (0.1 mg total) onto the skin every Sunday. Once weekly   [employer] Vitamin C 1000 MG tablet Generic drug: ascorbic acid Take 1,000 mg by mouth 2 (two) times daily.   furosemide 80 MG tablet Commonly known as: Lasix Take 1 tablet (80 mg total) by mouth 2 (two) times daily. On non-dialysis days   hydrALAZINE 100 MG tablet Commonly known as: APRESOLINE Take 0.5 tablets (50 mg total) by mouth 3 (three) times daily. What changed: how much to take   insulin aspart 100 UNIT/ML FlexPen Commonly known as: NOVOLOG Inject 3 Units into the skin 3 (three) times daily with meals.   isosorbide mononitrate 30 MG 24 hr tablet Commonly known as: IMDUR Take 1 tablet (30 mg total) by mouth daily.  Lantus 100 UNIT/ML injection Generic drug: insulin glargine Inject 0.1 mLs (10 Units total) into the skin at bedtime.   oxyCODONE 5 MG immediate release tablet Commonly known as: Oxy IR/ROXICODONE Take 1 tablet (5 mg total) by mouth every 4 (four) hours as needed for moderate pain.   polyethylene glycol 17 g packet Commonly known as: MIRALAX / GLYCOLAX Take 17 g by mouth daily as needed for mild constipation.   polyethylene glycol 17 g packet Commonly known as: MIRALAX / GLYCOLAX Take 17 g by mouth daily. Start taking on: December 08, 2020   ProAir HFA 108 (90 Base) MCG/ACT inhaler Generic drug: albuterol Inhale 2 puffs into the lungs every 6 (six) hours as needed for wheezing or shortness of breath.    promethazine 25 MG tablet Commonly known as: PHENERGAN Take 1 tablet (25 mg total) by mouth every 6 (six) hours as needed for nausea.   senna 8.6 MG Tabs tablet Commonly known as: SENOKOT Take 2 tablets (17.2 mg total) by mouth daily. Start taking on: December 08, 2020   sodium bicarbonate 650 MG tablet Take 1 tablet (650 mg total) by mouth 2 (two) times daily.   tamsulosin 0.4 MG Caps capsule Commonly known as: FLOMAX Take 1 capsule (0.4 mg total) by mouth daily after supper. For Bladder   Vitamin D (Ergocalciferol) 1.25 MG (50000 UNIT) Caps capsule Commonly known as: DRISDOL Take 1 capsule by mouth once a week.        Contact information for after-discharge care     [facility] .   Service: Skilled Nursing Contact information: [place] [address] [phone]                    Allergies  Allergen Reactions   Bee Venom Anaphylaxis, Hives, Shortness Of Breath and Swelling   Penicillins Anaphylaxis and Itching    Has patient had a PCN reaction causing immediate rash, facial/tongue/throat swelling, SOB or lightheadedness with hypotension: yes Has patient had a PCN reaction causing severe rash involving mucus membranes or skin necrosis: unknown Has patient had a PCN reaction that required hospitalization : yes Has patient had a PCN reaction occurring within the last 10 years: no If all of the above answers are "NO", then may proceed with Cephalosporin use.    Lorazepam     Other reaction(s): Hallucinations   Lovenox [Enoxaparin Sodium] Other (See Comments)    Hyperkalemia? GI bleed   Metoclopramide Other (See Comments)    Dystonia following metoclopramide injection    Consultations: Nephrology Vascular surgery   Procedures/Studies: DG Wrist Complete Left  Result Date: 12/01/2020 CLINICAL DATA:  35 year old female with left wrist pain. EXAM: LEFT WRIST - COMPLETE 3+ VIEW  COMPARISON:  None. FINDINGS: There is no acute fracture or dislocation. The bones are well mineralized. No arthritic changes. There is diffuse subcutaneous edema and soft tissue swelling. Vascular calcifications noted. IMPRESSION: 1. No acute fracture or dislocation. 2. Diffuse subcutaneous edema and vascular calcification. Electronically Signed   By: Anner Crete M.D.   On: 12/01/2020 20:21   US Venous Img Upper Uni Left (DVT)  Result Date: 12/02/2020 CLINICAL DATA:  35 year old female with a history of left wrist pain EXAM: LEFT UPPER EXTREMITY VENOUS DOPPLER ULTRASOUND TECHNIQUE: Gray-scale sonography with graded compression, as well as color Doppler and duplex ultrasound were performed to evaluate the upper extremity deep venous system from the level of the subclavian vein and including  the jugular, axillary, basilic, radial, ulnar and upper cephalic vein. Spectral Doppler was utilized to evaluate flow at rest and with distal augmentation maneuvers. COMPARISON:  None. FINDINGS: Contralateral Subclavian Vein: Respiratory phasicity is normal and symmetric with the symptomatic side. No evidence of thrombus. Normal compressibility. Internal Jugular Vein: No evidence of thrombus. Normal compressibility, respiratory phasicity and response to augmentation. Subclavian Vein: No evidence of thrombus. Normal compressibility, respiratory phasicity and response to augmentation. Axillary Vein: No evidence of thrombus. Normal compressibility, respiratory phasicity and response to augmentation. Cephalic Vein: No evidence of thrombus. Normal compressibility, respiratory phasicity and response to augmentation. Basilic Vein: No evidence of thrombus. Normal compressibility, respiratory phasicity and response to augmentation. Brachial Veins: No evidence of thrombus. Normal compressibility, respiratory phasicity and response to augmentation. Radial Veins: No evidence of thrombus. Normal compressibility, respiratory phasicity  and response to augmentation. Ulnar Veins: No evidence of thrombus. Normal compressibility, respiratory phasicity and response to augmentation. Other Findings:  Edema IMPRESSION: Sonographic survey of the left upper extremity negative for DVT Edema Electronically Signed   By: Corrie Mckusick D.O.   On: 12/02/2020 11:40   DG Chest Port 1 View  Result Date: 11/29/2020 CLINICAL DATA:  Status post dialysis catheter placement. EXAM: PORTABLE CHEST 1 VIEW COMPARISON:  11/26/2020 FINDINGS: Stable cardiac enlargement. Interval placement of right jugular tunneled dialysis catheter with catheter tip overlying the right atrium. No pneumothorax. Slight decrease in pulmonary vascular congestion. No pleural effusions. IMPRESSION: Dialysis catheter tip overlies the right atrium. No pneumothorax. Slight decrease in pulmonary vascular congestion. Electronically Signed   By: Aletta Edouard M.D.   On: 11/29/2020 16:17   Portable chest 1 View  Result Date: 11/26/2020 CLINICAL DATA:  Pleural effusion. EXAM: PORTABLE CHEST 1 VIEW COMPARISON:  November 25, 2020. FINDINGS: Stable cardiomediastinal silhouette. Mild central pulmonary vascular congestion may be present. Right basilar opacity is noted concerning for possible atelectasis and effusion. Bony thorax is unremarkable. IMPRESSION: Mild central pulmonary vascular congestion. Right basilar opacity is noted concerning for possible atelectasis and pleural effusion. Electronically Signed   By: Marijo Conception M.D.   On: 11/26/2020 10:10   DG Chest Portable 1 View  Result Date: 11/25/2020 CLINICAL DATA:  Edema. The patient states generalized weakness and swelling to lower limbs in the last 4 days. States SOB on exertion/ slight movement. Hx of diabetes, pulmonary edema, CHF, and HTN. Not being tested for covid-19 EXAM: PORTABLE CHEST 1 VIEW.  Patient is rotated. COMPARISON:  CT abdomen pelvis 10/20/2020 FINDINGS: Enlarged cardiac silhouette. The heart size and mediastinal contours  are otherwise unchanged. Again noted is slightly more lucent left hemithorax compared to the right. No focal consolidation. Mild pulmonary edema. No pneumothorax. No acute osseous abnormality. IMPRESSION: 1. Bilateral pleural effusions that are difficult to evaluate on this AP semi upright view. Given opacity overlying the right hemithorax, there is a likely larger than trace right pleural effusion. Recommend PA and lateral view of the chest for further evaluation given size of effusions on CT chest 10/20/20. 2. Persistent enlargement of the cardiac silhouette. Underlying pericardial effusion not excluded. 3. Mild pulmonary edema. Electronically Signed   By: Iven Finn M.D.   On: 11/25/2020 19:11   DG C-Arm 1-60 Min-No Report  Result Date: 11/29/2020 Fluoroscopy was utilized by the requesting physician.  No radiographic interpretation.   ECHOCARDIOGRAM COMPLETE  Result Date: 11/26/2020    ECHOCARDIOGRAM REPORT   Patient Name:   Sheila Austin Date of Exam: 11/26/2020 Medical Rec #:  [id number]  Height:       62.0 in Accession #:    [id number]       Weight:       382.7 lb Date of Birth:  [birth year]        BSA:          2.519 m Patient Age:    34 years         BP:           181/96 mmHg Patient Gender: F                HR:           80 bpm. Exam Location:  [facility] Procedure: 2D Echo, Cardiac Doppler and Color Doppler Indications:    CHF  History:        Patient has prior history of Echocardiogram examinations, most                 recent 09/24/2020. CHF; Risk Factors:Hypertension, Diabetes and                 Dyslipidemia.  Sonographer:    Wenda Low Referring Phys: [id number] ASIA B [place]  1. Left ventricular ejection fraction, by estimation, is 60 to 65%. The left ventricle has normal function. The left ventricle has no regional wall motion abnormalities. There is mild concentric left ventricular hypertrophy. Left ventricular diastolic parameters are indeterminate.  2.  Prominent moderator band present. Right ventricular systolic function is severely reduced. The right ventricular size is severely enlarged. There is mildly elevated pulmonary artery systolic pressure.  3. A small pericardial effusion is present. The pericardial effusion is circumferential.  4. The mitral valve is normal in structure. Trivial mitral valve regurgitation. No evidence of mitral stenosis.  5. Tricuspid valve regurgitation is mild to moderate.  6. The aortic valve is normal in structure. Aortic valve regurgitation is not visualized. No aortic stenosis is present.  7. The inferior vena cava is normal in size with greater than 50% respiratory variability, suggesting right atrial pressure of 3 mmHg. FINDINGS  Left Ventricle: Left ventricular ejection fraction, by estimation, is 60 to 65%. The left ventricle has normal function. The left ventricle has no regional wall motion abnormalities. The left ventricular internal cavity size was normal in size. There is  mild concentric left ventricular hypertrophy. Left ventricular diastolic parameters are indeterminate. Normal left ventricular filling pressure. Right Ventricle: Prominent moderator band present. The right ventricular size is severely enlarged. No increase in right ventricular wall thickness. Right ventricular systolic function is severely reduced. There is mildly elevated pulmonary artery systolic pressure. The tricuspid regurgitant velocity is 2.88 m/s, and with an assumed right atrial pressure of 8 mmHg, the estimated right ventricular systolic pressure is [id number] mmHg. Left Atrium: Left atrial size was normal in size. Right Atrium: Right atrial size was normal in size. Pericardium: A small pericardial effusion is present. The pericardial effusion is circumferential. Mitral Valve: The mitral valve is normal in structure. There is mild thickening of the mitral valve leaflet(s). Trivial mitral valve regurgitation. No evidence of mitral valve stenosis. MV  peak gradient, 8.0 mmHg. The mean mitral valve gradient is 2.0 mmHg. Tricuspid Valve: The tricuspid valve is normal in structure. Tricuspid valve regurgitation is mild to moderate. No evidence of tricuspid stenosis. Aortic Valve: The aortic valve is normal in structure. Aortic valve regurgitation is not visualized. No aortic stenosis is present. Aortic valve mean gradient measures 6.0 mmHg. Aortic valve peak gradient measures 8.8 mmHg.  Aortic valve area, by VTI measures 2.01 cm. Pulmonic Valve: The pulmonic valve was normal in structure. Pulmonic valve regurgitation is trivial. No evidence of pulmonic stenosis. Aorta: The aortic root is normal in size and structure. Venous: The inferior vena cava is normal in size with greater than 50% respiratory variability, suggesting right atrial pressure of 3 mmHg. IAS/Shunts: No atrial level shunt detected by color flow Doppler.  LEFT VENTRICLE PLAX 2D LVIDd:         4.71 cm  Diastology LVIDs:         3.45 cm  LV e' medial:    0.13 cm/s LV PW:         1.26 cm  LV E/e' medial:  11.1 LV IVS:        1.24 cm  LV e' lateral:   0.10 cm/s LVOT diam:     2.00 cm  LV E/e' lateral: 14.5 LV SV:         68 LV SV Index:   27 LVOT Area:     3.14 cm  RIGHT VENTRICLE RV Basal diam:  4.05 cm RV Mid diam:    4.65 cm LEFT ATRIUM             Index       RIGHT ATRIUM           Index LA diam:        4.50 cm 1.79 cm/m  RA Area:     18.50 cm LA Vol (A2C):   37.9 ml 15.05 ml/m RA Volume:   49.20 ml  19.53 ml/m LA Vol (A4C):   52.5 ml 20.84 ml/m LA Biplane Vol: 46.5 ml 18.46 ml/m  AORTIC VALVE AV Area (Vmax):    2.29 cm AV Area (Vmean):   1.99 cm AV Area (VTI):     2.01 cm AV Vmax:           148.00 cm/s AV Vmean:          119.000 cm/s AV VTI:            0.339 m AV Peak Grad:      8.8 mmHg AV Mean Grad:      6.0 mmHg LVOT Vmax:         108.00 cm/s LVOT Vmean:        75.400 cm/s LVOT VTI:          0.217 m LVOT/AV VTI ratio: 0.64  AORTA Ao Root diam: 3.20 cm Ao Asc diam:  2.80 cm MITRAL VALVE                TRICUSPID VALVE MV Area (PHT): 5.66 cm    TR Peak grad:   33.2 mmHg MV Area VTI:   2.53 cm    TR Vmax:        288.00 cm/s MV Peak grad:  8.0 mmHg MV Mean grad:  2.0 mmHg    SHUNTS MV Vmax:       1.41 m/s    Systemic VTI:  0.22 m MV Vmean:      60.2 cm/s   Systemic Diam: 2.00 cm MV Decel Time: 134 msec MV E velocity: 1.45 cm/s MV A velocity: 65.30 cm/s MV E/A ratio:  0.02 Fransico Him MD Electronically signed by Fransico Him MD Signature Date/Time: 11/26/2020/12:32:16 PM    Final       Subjective: She denies any shortness of breath.  Overall she is feeling better.  Discharge Exam: Vitals:   12/07/20 1430 12/07/20  1500 12/07/20 1530 12/07/20 1600  BP: 138/81 (!) 149/90 (!) 145/80 (!) 145/82  Pulse: 76 76 75 77  Resp:      Temp:      TempSrc:      SpO2:      Weight:      Height:        General: Pt is alert, awake, not in acute distress Cardiovascular: RRR, S1/S2 +, no rubs, no gallops Respiratory: CTA bilaterally, no wheezing, no rhonchi Abdominal: Soft, NT, ND, bowel sounds + Extremities: 2+ lower extremity edema    The results of significant diagnostics from this hospitalization (including imaging, microbiology, ancillary and laboratory) are listed below for reference.     Microbiology: Recent Results (from the past 240 hour(s))  Surgical pcr screen     Status: Abnormal   Collection Time: 11/28/20 10:11 PM   Specimen: Nasal Mucosa; Nasal Swab  Result Value Ref Range Status   MRSA, PCR POSITIVE (A) NEGATIVE Final    Comment: RESULT CALLED TO, READ BACK BY AND VERIFIED WITH: WATLINGTON,J'@0307'$  BY MATTHEWS, B 7.26.22    Staphylococcus aureus POSITIVE (A) NEGATIVE Final    Comment: RESULT CALLED TO, READ BACK BY AND VERIFIED WITH: WATLINGTON,J'@0307'$  BY MATTHEWS, B 7.26.22 (NOTE) The Xpert SA Assay (FDA approved for NASAL specimens in patients 36 years of age and older), is one component of a comprehensive surveillance program. It is not intended to diagnose  infection nor to guide or monitor treatment. Performed at [facility], [address] [place], [place] [address]      Labs: BNP (last 3 results) Recent Labs    10/20/20 0703 11/25/20 1801  BNP 1,253.0* [id number]*   Basic Metabolic Panel: Recent Labs  Lab 12/03/20 0500 12/04/20 0228 12/05/20 0529 12/06/20 0434 12/07/20 0445  NA 135 136 134* 136 138  K 3.5 3.8 3.8 4.0 4.1  CL 101 100 99 100 100  CO2 '26 27 26 26 26  '$ GLUCOSE 133* 141* 137* 132* 141*  BUN 43* 45* 51* 55* 62*  CREATININE 4.50* 4.92* 5.43* 5.91* 6.35*  CALCIUM 7.4* 7.7* 7.6* 7.6* 7.6*  PHOS 4.2 4.4 4.8* 5.1* 5.6*   Liver Function Tests: Recent Labs  Lab 12/03/20 0500 12/04/20 0228 12/05/20 0529 12/06/20 0434 12/07/20 0445  ALBUMIN 2.2* 2.2* 2.3* 2.3* 2.3*   No results for input(s): LIPASE, AMYLASE in the last 168 hours. No results for input(s): AMMONIA in the last 168 hours. CBC: Recent Labs  Lab 12/01/20 2010 12/06/20 0434  WBC 8.5 9.3  HGB 7.3* 7.7*  HCT 22.9* 25.5*  MCV 94.2 96.6  PLT 196 231   Cardiac Enzymes: No results for input(s): CKTOTAL, CKMB, CKMBINDEX, TROPONINI in the last 168 hours. BNP: Invalid input(s): POCBNP CBG: Recent Labs  Lab 12/06/20 1114 12/06/20 1609 12/06/20 2045 12/07/20 0737 12/07/20 1130  GLUCAP 158* 140* 150* 117* 131*   D-Dimer No results for input(s): DDIMER in the last 72 hours. Hgb A1c No results for input(s): HGBA1C in the last 72 hours. Lipid Profile No results for input(s): CHOL, HDL, LDLCALC, TRIG, CHOLHDL, LDLDIRECT in the last 72 hours. Thyroid function studies No results for input(s): TSH, T4TOTAL, T3FREE, THYROIDAB in the last 72 hours.  Invalid input(s): FREET3 Anemia work up No results for input(s): VITAMINB12, FOLATE, FERRITIN, TIBC, IRON, RETICCTPCT in the last 72 hours. Urinalysis    Component Value Date/Time   COLORURINE YELLOW 11/25/2020 2037   APPEARANCEUR CLEAR 11/25/2020 2037   LABSPEC 1.011 11/25/2020 2037   PHURINE 6.0  11/25/2020  2037   GLUCOSEU 150 (A) 11/25/2020 2037   HGBUR SMALL (A) 11/25/2020 2037   BILIRUBINUR NEGATIVE 11/25/2020 2037   BILIRUBINUR negative 08/13/2017 1622   KETONESUR NEGATIVE 11/25/2020 2037   PROTEINUR >=300 (A) 11/25/2020 2037   UROBILINOGEN 0.2 08/13/2017 1622   UROBILINOGEN 0.2 11/25/2014 0553   NITRITE NEGATIVE 11/25/2020 2037   LEUKOCYTESUR NEGATIVE 11/25/2020 2037   Sepsis Labs Invalid input(s): PROCALCITONIN,  WBC,  LACTICIDVEN Microbiology Recent Results (from the past 240 hour(s))  Surgical pcr screen     Status: Abnormal   Collection Time: 11/28/20 10:11 PM   Specimen: Nasal Mucosa; Nasal Swab  Result Value Ref Range Status   MRSA, PCR POSITIVE (A) NEGATIVE Final    Comment: RESULT CALLED TO, READ BACK BY AND VERIFIED WITH: WATLINGTON,J'@0307'$  BY MATTHEWS, B 7.26.22    Staphylococcus aureus POSITIVE (A) NEGATIVE Final    Comment: RESULT CALLED TO, READ BACK BY AND VERIFIED WITH: WATLINGTON,J'@0307'$  BY MATTHEWS, B 7.26.22 (NOTE) The Xpert SA Assay (FDA approved for NASAL specimens in patients 12 years of age and older), is one component of a comprehensive surveillance program. It is not intended to diagnose infection nor to guide or monitor treatment. Performed at [facility], [address] [place], [place] [address]      Time coordinating discharge: 39mns  SIGNED:   JKathie Dike MD  [facility] 12/07/2020, 4:20 PM   If 7PM-7AM, please contact night-coverage [email or web address]

## 2020-12-07 NOTE — Progress Notes (Signed)
Nsg Discharge Note  Admit Date:  11/25/2020 Discharge date: 12/07/2020   [name] to be D/C'd Home per MD order.  AVS completed.  Copy for chart, and copy for patient signed, and dated. Patient/caregiver able to verbalize understanding.  Discharge Medication: Allergies as of 12/07/2020       Reactions   Bee Venom Anaphylaxis, Hives, Shortness Of Breath, Swelling   Penicillins Anaphylaxis, Itching   Has patient had a PCN reaction causing immediate rash, facial/tongue/throat swelling, SOB or lightheadedness with hypotension: yes Has patient had a PCN reaction causing severe rash involving mucus membranes or skin necrosis: unknown Has patient had a PCN reaction that required hospitalization : yes Has patient had a PCN reaction occurring within the last 10 years: no If all of the above answers are "NO", then may proceed with Cephalosporin use.   Lorazepam    Other reaction(s): Hallucinations   Lovenox [enoxaparin Sodium] Other (See Comments)   Hyperkalemia? GI bleed   Metoclopramide Other (See Comments)   Dystonia following metoclopramide injection        Medication List     STOP taking these medications    amLODipine 10 MG tablet Commonly known as: NORVASC   bumetanide 1 MG tablet Commonly known as: BUMEX   [employer] Zinc Gluconate 50 MG tablet Generic drug: zinc gluconate   ferrous sulfate 325 (65 FE) MG tablet   levETIRAcetam 500 MG tablet Commonly known as: Keppra   metolazone 2.5 MG tablet Commonly known as: ZAROXOLYN       TAKE these medications    calcium acetate 667 MG capsule Commonly known as: PHOSLO Take 2 capsules (1,334 mg total) by mouth 3 (three) times daily with meals.   carvedilol 12.5 MG tablet Commonly known as: COREG Take 2 tablets (25 mg total) by mouth 2 (two) times daily. What changed: how much to take   cloNIDine 0.1 mg/24hr patch Commonly known as: CATAPRES - Dosed in mg/24 hr Place 1 patch (0.1 mg total) onto the skin every  Sunday. Once weekly   [facility] Vitamin C 1000 MG tablet Generic drug: ascorbic acid Take 1,000 mg by mouth 2 (two) times daily.   furosemide 80 MG tablet Commonly known as: Lasix Take 1 tablet (80 mg total) by mouth 2 (two) times daily. On non-dialysis days   hydrALAZINE 100 MG tablet Commonly known as: APRESOLINE Take 0.5 tablets (50 mg total) by mouth 3 (three) times daily. What changed: how much to take   insulin aspart 100 UNIT/ML FlexPen Commonly known as: NOVOLOG Inject 3 Units into the skin 3 (three) times daily with meals.   isosorbide mononitrate 30 MG 24 hr tablet Commonly known as: IMDUR Take 1 tablet (30 mg total) by mouth daily.   Lantus 100 UNIT/ML injection Generic drug: insulin glargine Inject 0.1 mLs (10 Units total) into the skin at bedtime.   oxyCODONE 5 MG immediate release tablet Commonly known as: Oxy IR/ROXICODONE Take 1 tablet (5 mg total) by mouth every 4 (four) hours as needed for moderate pain.   polyethylene glycol 17 g packet Commonly known as: MIRALAX / GLYCOLAX Take 17 g by mouth daily as needed for mild constipation.   polyethylene glycol 17 g packet Commonly known as: MIRALAX / GLYCOLAX Take 17 g by mouth daily. Start taking on: December 08, 2020   ProAir HFA 108 (90 Base) MCG/ACT inhaler Generic drug: albuterol Inhale 2 puffs into the lungs every 6 (six) hours as needed for wheezing or shortness of breath.  promethazine 25 MG tablet Commonly known as: PHENERGAN Take 1 tablet (25 mg total) by mouth every 6 (six) hours as needed for nausea.   senna 8.6 MG Tabs tablet Commonly known as: SENOKOT Take 2 tablets (17.2 mg total) by mouth daily. Start taking on: December 08, 2020   sodium bicarbonate 650 MG tablet Take 1 tablet (650 mg total) by mouth 2 (two) times daily.   tamsulosin 0.4 MG Caps capsule Commonly known as: FLOMAX Take 1 capsule (0.4 mg total) by mouth daily after supper. For Bladder   Vitamin D (Ergocalciferol) 1.25 MG  (50000 UNIT) Caps capsule Commonly known as: DRISDOL Take 1 capsule by mouth once a week.        Discharge Assessment: Vitals:   12/07/20 1630 12/07/20 1640  BP: (!) 145/80 140/79  Pulse: 76 74  Resp:  18  Temp:  98.1 F (36.7 C)  SpO2:  99%   Skin clean, dry and intact without evidence of skin break down, no evidence of skin tears noted. IV catheter discontinued intact. Site without signs and symptoms of complications - no redness or edema noted at insertion site, patient denies c/o pain - only slight tenderness at site.  Dressing with slight pressure applied.  D/c Instructions-Education: Discharge instructions given to patient/family with verbalized understanding. D/c education completed with patient/family including follow up instructions, medication list, d/c activities limitations if indicated, with other d/c instructions as indicated by MD - patient able to verbalize understanding, all questions fully answered. Patient instructed to return to ED, call 911, or call MD for any changes in condition.  Patient escorted via [place], and D/C home via private auto.  Clovis Fredrickson, RN 12/07/2020 5:32 PM Nsg Discharge Note  Admit Date:  11/25/2020 Discharge date: 12/07/2020   [name] to be D/C'd Home per MD order.  AVS completed.  Copy for chart, and copy for patient signed, and dated. Patient/caregiver able to verbalize understanding.  Discharge Medication: Allergies as of 12/07/2020       Reactions   Bee Venom Anaphylaxis, Hives, Shortness Of Breath, Swelling   Penicillins Anaphylaxis, Itching   Has patient had a PCN reaction causing immediate rash, facial/tongue/throat swelling, SOB or lightheadedness with hypotension: yes Has patient had a PCN reaction causing severe rash involving mucus membranes or skin necrosis: unknown Has patient had a PCN reaction that required hospitalization : yes Has patient had a PCN reaction occurring within the last 10 years: no If all of  the above answers are "NO", then may proceed with Cephalosporin use.   Lorazepam    Other reaction(s): Hallucinations   Lovenox [enoxaparin Sodium] Other (See Comments)   Hyperkalemia? GI bleed   Metoclopramide Other (See Comments)   Dystonia following metoclopramide injection        Medication List     STOP taking these medications    amLODipine 10 MG tablet Commonly known as: NORVASC   bumetanide 1 MG tablet Commonly known as: BUMEX   [employer] Zinc Gluconate 50 MG tablet Generic drug: zinc gluconate   ferrous sulfate 325 (65 FE) MG tablet   levETIRAcetam 500 MG tablet Commonly known as: Keppra   metolazone 2.5 MG tablet Commonly known as: ZAROXOLYN       TAKE these medications    calcium acetate 667 MG capsule Commonly known as: PHOSLO Take 2 capsules (1,334 mg total) by mouth 3 (three) times daily with meals.   carvedilol 12.5 MG tablet Commonly known as: COREG Take 2 tablets (25 mg  total) by mouth 2 (two) times daily. What changed: how much to take   cloNIDine 0.1 mg/24hr patch Commonly known as: CATAPRES - Dosed in mg/24 hr Place 1 patch (0.1 mg total) onto the skin every Sunday. Once weekly   [facility] Vitamin C 1000 MG tablet Generic drug: ascorbic acid Take 1,000 mg by mouth 2 (two) times daily.   furosemide 80 MG tablet Commonly known as: Lasix Take 1 tablet (80 mg total) by mouth 2 (two) times daily. On non-dialysis days   hydrALAZINE 100 MG tablet Commonly known as: APRESOLINE Take 0.5 tablets (50 mg total) by mouth 3 (three) times daily. What changed: how much to take   insulin aspart 100 UNIT/ML FlexPen Commonly known as: NOVOLOG Inject 3 Units into the skin 3 (three) times daily with meals.   isosorbide mononitrate 30 MG 24 hr tablet Commonly known as: IMDUR Take 1 tablet (30 mg total) by mouth daily.   Lantus 100 UNIT/ML injection Generic drug: insulin glargine Inject 0.1 mLs (10 Units total) into the skin at bedtime.   oxyCODONE 5 MG  immediate release tablet Commonly known as: Oxy IR/ROXICODONE Take 1 tablet (5 mg total) by mouth every 4 (four) hours as needed for moderate pain.   polyethylene glycol 17 g packet Commonly known as: MIRALAX / GLYCOLAX Take 17 g by mouth daily as needed for mild constipation.   polyethylene glycol 17 g packet Commonly known as: MIRALAX / GLYCOLAX Take 17 g by mouth daily. Start taking on: December 08, 2020   ProAir HFA 108 (90 Base) MCG/ACT inhaler Generic drug: albuterol Inhale 2 puffs into the lungs every 6 (six) hours as needed for wheezing or shortness of breath.   promethazine 25 MG tablet Commonly known as: PHENERGAN Take 1 tablet (25 mg total) by mouth every 6 (six) hours as needed for nausea.   senna 8.6 MG Tabs tablet Commonly known as: SENOKOT Take 2 tablets (17.2 mg total) by mouth daily. Start taking on: December 08, 2020   sodium bicarbonate 650 MG tablet Take 1 tablet (650 mg total) by mouth 2 (two) times daily.   tamsulosin 0.4 MG Caps capsule Commonly known as: FLOMAX Take 1 capsule (0.4 mg total) by mouth daily after supper. For Bladder   Vitamin D (Ergocalciferol) 1.25 MG (50000 UNIT) Caps capsule Commonly known as: DRISDOL Take 1 capsule by mouth once a week.        Discharge Assessment: Vitals:   12/07/20 1630 12/07/20 1640  BP: (!) 145/80 140/79  Pulse: 76 74  Resp:  18  Temp:  98.1 F (36.7 C)  SpO2:  99%   Skin clean, dry and intact without evidence of skin break down, no evidence of skin tears noted. IV catheter discontinued intact. Site without signs and symptoms of complications - no redness or edema noted at insertion site, patient denies c/o pain - only slight tenderness at site.  Dressing with slight pressure applied.  D/c Instructions-Education: Discharge instructions given to patient/family with verbalized understanding. D/c education completed with patient/family including follow up instructions, medication list, d/c activities  limitations if indicated, with other d/c instructions as indicated by MD - patient able to verbalize understanding, all questions fully answered. Patient instructed to return to ED, call 911, or call MD for any changes in condition.  Patient escorted via [place], and D/C home via private auto.  Clovis Fredrickson, LPN [phone] PM

## 2020-12-07 NOTE — Progress Notes (Signed)
Patient ID: Sheila Austin, female   DOB: 04/01/1986, 35 y.o.   MRN: [id number] Bilateral upper extremity arterial and venous study reviewed.  Very marginal right cephalic vein for fistula creation.  Does have large caliber basilic vein in the right upper arm and should be a candidate for two-stage basilic vein fistula.  This could be done at [facility] on 12/13/2020 as an outpatient if she has placement or inpatient if she is still in the hospital.  Will follow her chart for disposition

## 2020-12-08 ENCOUNTER — Other Ambulatory Visit: Payer: Self-pay

## 2020-12-09 NOTE — Patient Instructions (Signed)
Sheila Austin  12/09/2020     '@PREFPERIOPPHARMACY'$ @   Your procedure is scheduled on  12/13/2020.   Report to [facility] at  0830  A.M.   Call this number if you have problems the morning of surgery:  [phone]   Remember:  Do not eat or drink after midnight.      Take these medicines the morning of surgery with A SIP OF WATER          carvedilol, isosorbide, oxycodone (if needed)     Do not wear jewelry, make-up or nail polish.  Do not wear lotions, powders, or perfumes, or deodorant.  Do not shave 48 hours prior to surgery.  Men may shave face and neck.  Do not bring valuables to the hospital.  [facility] is not responsible for any belongings or valuables.  Contacts, dentures or bridgework may not be worn into surgery.  Leave your suitcase in the car.  After surgery it may be brought to your room.  For patients admitted to the hospital, discharge time will be determined by your treatment team.  Patients discharged the day of surgery will not be allowed to drive home and must have someone with them for 24 hours.    Special instructions:   DO NOT smoke tobacco or vape for 24 hours before your procedure.  Please read over the following fact sheets that you were given. Anesthesia Post-op Instructions and Care and Recovery After Surgery      AV Fistula Placement, Care After The following information offers guidance on how to care for yourself after your procedure. Your health care provider may also give you more specific instructions. If you have problems or questions, contact your health careprovider. What can I expect after the procedure? After the procedure, it is common to have: Soreness at the fistula site. Vibration (thrill) over the fistula. Follow these instructions at home: Medicines Take over-the-counter and prescription medicines only as told by your health care provider. Ask your health care provider if the medicine prescribed to you can  cause constipation. You may need to take these actions to prevent or treat constipation: Drink enough fluid to keep your urine pale yellow. Take over-the-counter or prescription medicines. Eat foods that are high in fiber, such as beans, whole grains, and fresh fruits and vegetables. Limit foods that are high in fat and processed sugars, such as fried or sweet foods. Incision care Follow instructions from your health care provider about how to take care of your incision. Make sure you: Wash your hands with soap and water for at least 20 seconds before and after you change your bandage (dressing). If soap and water are not available, use hand sanitizer. Change your dressing as told by your health care provider. Leave stitches (sutures), skin glue, or adhesive strips in place. These skin closures may need to stay in place for 2 weeks or longer. If adhesive strip edges start to loosen and curl up, you may trim the loose edges. Do not remove adhesive strips completely unless your health care provider tells you to do that.  Fistula care  Check your fistula site every day to make sure the thrill feels the same. Check your fistula site every day for signs of infection. Check for: More redness, swelling, or pain. Fluid or blood. Warmth. Pus or a bad smell. Raise (elevate) the affected area above the level of your heart while you are sitting or lying down.  Do not lift anything that is heavier than 10 lb (4.5 kg), or the limit that you are told, until your health care provider says that it is safe. Do not lie down on your fistula arm. Do not let anyone draw blood or take a blood pressure reading on your fistula arm. This is important. Do not wear tight jewelry or clothing over your fistula arm.  Bathing Do not take baths, swim, or use a hot tub until your health care provider approves. Ask your health care provider if you may take showers. You may only be allowed to take sponge baths. Keep the area  around your incision clean and dry. General instructions Rest at home for a day or two. If you were given a sedative during the procedure, it can affect you for several hours. Do not drive or operate machinery until your health care provider says that it is safe. Return to your normal activities as told. Ask your health care provider what activities are safe for you. Keep all follow-up visits. This is important. Contact a health care provider if: You have more redness, swelling, or pain around your fistula site. Your fistula site feels warm to the touch. You have pus or a bad smell coming from your fistula site. You have a fever or chills. You feel numb or cold in your arm or your fistula site. You feel a decrease or a change in the thrill over the fistula. Get help right away if: You have bleeding from your fistula site that will not stop. You have chest pain. You have trouble breathing. These symptoms may represent a serious problem that is an emergency. Do not wait to see if the symptoms will go away. Get medical help right away. Call your local emergency services (911 in the [place] Do not drive yourself to the hospital. Summary Follow instructions from your health care provider about how to take care of your incision. Do not let anyone draw blood or take a blood pressure reading on your fistula arm. This is important. Contact a health care provider if you have a change in the thrill or have any signs of infection at your fistula site. Keep all follow-up visits. This is important. This information is not intended to replace advice given to you by your health care provider. Make sure you discuss any questions you have with your healthcare provider. Document Revised: 12/02/2019 Document Reviewed: 12/02/2019 Elsevier Patient Education  [place] After This sheet gives you information about how to care for yourself after your procedure. Your health  care provider may also give you more specific instructions. If you have problems or questions, contact your health careprovider. What can I expect after the procedure? After the procedure, it is common to have: Tiredness. Forgetfulness about what happened after the procedure. Impaired judgment for important decisions. Nausea or vomiting. Some difficulty with balance. Follow these instructions at home: For the time period you were told by your health care provider:     Rest as needed. Do not participate in activities where you could fall or become injured. Do not drive or use machinery. Do not drink alcohol. Do not take sleeping pills or medicines that cause drowsiness. Do not make important decisions or sign legal documents. Do not take care of children on your own. Eating and drinking Follow the diet that is recommended by your health care provider. Drink enough fluid to keep your urine pale yellow. If you vomit: Drink water,  juice, or soup when you can drink without vomiting. Make sure you have little or no nausea before eating solid foods. General instructions Have a responsible adult stay with you for the time you are told. It is important to have someone help care for you until you are awake and alert. Take over-the-counter and prescription medicines only as told by your health care provider. If you have sleep apnea, surgery and certain medicines can increase your risk for breathing problems. Follow instructions from your health care provider about wearing your sleep device: Anytime you are sleeping, including during daytime naps. While taking prescription pain medicines, sleeping medicines, or medicines that make you drowsy. Avoid smoking. Keep all follow-up visits as told by your health care provider. This is important. Contact a health care provider if: You keep feeling nauseous or you keep vomiting. You feel light-headed. You are still sleepy or having trouble with  balance after 24 hours. You develop a rash. You have a fever. You have redness or swelling around the IV site. Get help right away if: You have trouble breathing. You have new-onset confusion at home. Summary For several hours after your procedure, you may feel tired. You may also be forgetful and have poor judgment. Have a responsible adult stay with you for the time you are told. It is important to have someone help care for you until you are awake and alert. Rest as told. Do not drive or operate machinery. Do not drink alcohol or take sleeping pills. Get help right away if you have trouble breathing, or if you suddenly become confused. This information is not intended to replace advice given to you by your health care provider. Make sure you discuss any questions you have with your healthcare provider. Document Revised: 01/07/2020 Document Reviewed: 03/26/2019 Elsevier Patient Education  2022 [place]. How to Use Chlorhexidine for Bathing Chlorhexidine gluconate (CHG) is a germ-killing (antiseptic) solution that is used to clean the skin. It can get rid of the bacteria that normally live on the skin and can keep them away for about 24 hours. To clean your skin with CHG, you may be given: A CHG solution to use in the shower or as part of a sponge bath. A prepackaged cloth that contains CHG. Cleaning your skin with CHG may help lower the risk for infection: While you are staying in the intensive care unit of the hospital. If you have a vascular access, such as a central line, to provide short-term or long-term access to your veins. If you have a catheter to drain urine from your bladder. If you are on a ventilator. A ventilator is a machine that helps you breathe by moving air in and out of your lungs. After surgery. What are the risks? Risks of using CHG include: A skin reaction. Hearing loss, if CHG gets in your ears. Eye injury, if CHG gets in your eyes and is not rinsed  out. The CHG product catching fire. Make sure that you avoid smoking and flames after applying CHG to your skin. Do not use CHG: If you have a chlorhexidine allergy or have previously reacted to chlorhexidine. On babies younger than 40 months of age. How to use CHG solution Use CHG only as told by your health care provider, and follow the instructions on the label. Use the full amount of CHG as directed. Usually, this is one bottle. During a shower Follow these steps when using CHG solution during a shower (unless your health care provider gives  you different instructions): Start the shower. Use your normal soap and shampoo to wash your face and hair. Turn off the shower or move out of the shower stream. Pour the CHG onto a clean washcloth. Do not use any type of brush or rough-edged sponge. Starting at your neck, lather your body down to your toes. Make sure you follow these instructions: If you will be having surgery, pay special attention to the part of your body where you will be having surgery. Scrub this area for at least 1 minute. Do not use CHG on your head or face. If the solution gets into your ears or eyes, rinse them well with water. Avoid your genital area. Avoid any areas of skin that have broken skin, cuts, or scrapes. Scrub your back and under your arms. Make sure to wash skin folds. Let the lather sit on your skin for 1-2 minutes or as long as told by your health care provider. Thoroughly rinse your entire body in the shower. Make sure that all body creases and crevices are rinsed well. Dry off with a clean towel. Do not put any substances on your body afterward--such as powder, lotion, or perfume--unless you are told to do so by your health care provider. Only use lotions that are recommended by the manufacturer. Put on clean clothes or pajamas. If it is the night before your surgery, sleep in clean sheets.  During a sponge bath Follow these steps when using CHG solution  during a sponge bath (unless your health care provider gives you different instructions): Use your normal soap and shampoo to wash your face and hair. Pour the CHG onto a clean washcloth. Starting at your neck, lather your body down to your toes. Make sure you follow these instructions: If you will be having surgery, pay special attention to the part of your body where you will be having surgery. Scrub this area for at least 1 minute. Do not use CHG on your head or face. If the solution gets into your ears or eyes, rinse them well with water. Avoid your genital area. Avoid any areas of skin that have broken skin, cuts, or scrapes. Scrub your back and under your arms. Make sure to wash skin folds. Let the lather sit on your skin for 1-2 minutes or as long as told by your health care provider. Using a different clean, wet washcloth, thoroughly rinse your entire body. Make sure that all body creases and crevices are rinsed well. Dry off with a clean towel. Do not put any substances on your body afterward--such as powder, lotion, or perfume--unless you are told to do so by your health care provider. Only use lotions that are recommended by the manufacturer. Put on clean clothes or pajamas. If it is the night before your surgery, sleep in clean sheets. How to use CHG prepackaged cloths Only use CHG cloths as told by your health care provider, and follow the instructions on the label. Use the CHG cloth on clean, dry skin. Do not use the CHG cloth on your head or face unless your health care provider tells you to. When washing with the CHG cloth: Avoid your genital area. Avoid any areas of skin that have broken skin, cuts, or scrapes. Before surgery Follow these steps when using a CHG cloth to clean before surgery (unless your health care provider gives you different instructions): Using the CHG cloth, vigorously scrub the part of your body where you will be having surgery. Scrub using a  back-and-forth motion for 3 minutes. The area on your body should be completely wet with CHG when you are done scrubbing. Do not rinse. Discard the cloth and let the area air-dry. Do not put any substances on the area afterward, such as powder, lotion, or perfume. Put on clean clothes or pajamas. If it is the night before your surgery, sleep in clean sheets.  For general bathing Follow these steps when using CHG cloths for general bathing (unless your health care provider gives you different instructions). Use a separate CHG cloth for each area of your body. Make sure you wash between any folds of skin and between your fingers and toes. Wash your body in the following order, switching to a new cloth after each step: The front of your neck, shoulders, and chest. Both of your arms, under your arms, and your hands. Your stomach and groin area, avoiding the genitals. Your right leg and foot. Your left leg and foot. The back of your neck, your back, and your buttocks. Do not rinse. Discard the cloth and let the area air-dry. Do not put any substances on your body afterward--such as powder, lotion, or perfume--unless you are told to do so by your health care provider. Only use lotions that are recommended by the manufacturer. Put on clean clothes or pajamas. Contact a health care provider if: Your skin gets irritated after scrubbing. You have questions about using your solution or cloth. Get help right away if: Your eyes become very red or swollen. Your eyes itch badly. Your skin itches badly and is red or swollen. Your hearing changes. You have trouble seeing. You have swelling or tingling in your mouth or throat. You have trouble breathing. You swallow any chlorhexidine. Summary Chlorhexidine gluconate (CHG) is a germ-killing (antiseptic) solution that is used to clean the skin. Cleaning your skin with CHG may help to lower your risk for infection. You may be given CHG to use for bathing.  It may be in a bottle or in a prepackaged cloth to use on your skin. Carefully follow your health care provider's instructions and the instructions on the product label. Do not use CHG if you have a chlorhexidine allergy. Contact your health care provider if your skin gets irritated after scrubbing. This information is not intended to replace advice given to you by your health care provider. Make sure you discuss any questions you have with your healthcare provider. Document Revised: 09/04/2019 Document Reviewed: 10/09/2019 Elsevier Patient Education  [place].

## 2020-12-12 ENCOUNTER — Encounter (HOSPITAL_COMMUNITY)
Admission: RE | Admit: 2020-12-12 | Discharge: 2020-12-12 | Disposition: A | Discharge status: Home / Self Care | Payer: Medicaid Other | Source: Ambulatory Visit | Attending: Vascular Surgery | Admitting: Vascular Surgery

## 2020-12-12 ENCOUNTER — Other Ambulatory Visit: Payer: Self-pay

## 2020-12-12 NOTE — Progress Notes (Signed)
Spoke with Monica Martinez, patient's nurse at [facility]. Arrive to [facility] Main entrance at 8 am.   Instructed to have patient NPO except for Coreg, Isosorbide, and oxycodone if needed.  No diabetic medication am of surgery and give only 1/2 dose of Lantus (5 units) the night before procedure.

## 2020-12-13 ENCOUNTER — Ambulatory Visit (HOSPITAL_COMMUNITY): Payer: Medicaid Other | Admitting: Anesthesiology

## 2020-12-13 ENCOUNTER — Other Ambulatory Visit: Payer: Self-pay

## 2020-12-13 ENCOUNTER — Encounter (HOSPITAL_COMMUNITY)
Admission: RE | Disposition: A | Discharge status: Home / Self Care | Payer: Self-pay | Source: Home / Self Care | Attending: Vascular Surgery

## 2020-12-13 ENCOUNTER — Ambulatory Visit (HOSPITAL_COMMUNITY)
Admission: RE | Admit: 2020-12-13 | Discharge: 2020-12-13 | Disposition: A | Discharge status: Home / Self Care | Payer: Medicaid Other | Attending: Vascular Surgery | Admitting: Vascular Surgery

## 2020-12-13 DIAGNOSIS — I132 Hypertensive heart and chronic kidney disease with heart failure and with stage 5 chronic kidney disease, or end stage renal disease: Secondary | ICD-10-CM | POA: Insufficient documentation

## 2020-12-13 DIAGNOSIS — I509 Heart failure, unspecified: Secondary | ICD-10-CM | POA: Diagnosis not present

## 2020-12-13 DIAGNOSIS — N186 End stage renal disease: Secondary | ICD-10-CM | POA: Insufficient documentation

## 2020-12-13 DIAGNOSIS — Z992 Dependence on renal dialysis: Secondary | ICD-10-CM | POA: Diagnosis not present

## 2020-12-13 DIAGNOSIS — N185 Chronic kidney disease, stage 5: Secondary | ICD-10-CM | POA: Diagnosis not present

## 2020-12-13 DIAGNOSIS — E1122 Type 2 diabetes mellitus with diabetic chronic kidney disease: Secondary | ICD-10-CM | POA: Diagnosis not present

## 2020-12-13 HISTORY — PX: AV FISTULA PLACEMENT: SHX1204

## 2020-12-13 LAB — POCT I-STAT, CHEM 8
BUN: 27 mg/dL — ABNORMAL HIGH (ref 6–20)
Calcium, Ion: 0.97 mmol/L — ABNORMAL LOW (ref 1.15–1.40)
Chloride: 101 mmol/L (ref 98–111)
Creatinine, Ser: 3.8 mg/dL — ABNORMAL HIGH (ref 0.44–1.00)
Glucose, Bld: 121 mg/dL — ABNORMAL HIGH (ref 70–99)
HCT: 30 % — ABNORMAL LOW (ref 36.0–46.0)
Hemoglobin: 10.2 g/dL — ABNORMAL LOW (ref 12.0–15.0)
Potassium: 4.3 mmol/L (ref 3.5–5.1)
Sodium: 138 mmol/L (ref 135–145)
TCO2: 29 mmol/L (ref 22–32)

## 2020-12-13 LAB — HCG, SERUM, QUALITATIVE: Preg, Serum: NEGATIVE

## 2020-12-13 SURGERY — ARTERIOVENOUS (AV) FISTULA CREATION
Anesthesia: General | Site: Arm Upper | Laterality: Right

## 2020-12-13 MED ORDER — 0.9 % SODIUM CHLORIDE (POUR BTL) OPTIME
TOPICAL | Status: DC | PRN
  Administered 2020-12-13: 1000 mL

## 2020-12-13 MED ORDER — PROPOFOL 10 MG/ML IV BOLUS
INTRAVENOUS | Status: DC | PRN
  Administered 2020-12-13 (×2): 20 mg via INTRAVENOUS

## 2020-12-13 MED ORDER — PROPOFOL 500 MG/50ML IV EMUL
INTRAVENOUS | Status: DC | PRN
  Administered 2020-12-13: 50 ug/kg/min via INTRAVENOUS
  Administered 2020-12-13: 100 ug/kg/min via INTRAVENOUS

## 2020-12-13 MED ORDER — VANCOMYCIN HCL IN DEXTROSE 1-5 GM/200ML-% IV SOLN: 1000.0000 mg | INTRAVENOUS | Status: DC

## 2020-12-13 MED ORDER — VANCOMYCIN HCL 1500 MG/300ML IV SOLN
1500.0000 mg | INTRAVENOUS | Status: AC
  Administered 2020-12-13: 1500 mg via INTRAVENOUS
  Filled 2020-12-13 (×2): qty 300

## 2020-12-13 MED ORDER — HEPARIN SODIUM (PORCINE) 1000 UNIT/ML IJ SOLN
INTRAMUSCULAR | Status: AC
  Filled 2020-12-13: qty 6

## 2020-12-13 MED ORDER — LIDOCAINE-EPINEPHRINE 0.5 %-1:200000 IJ SOLN
INTRAMUSCULAR | Status: AC
  Filled 2020-12-13: qty 1

## 2020-12-13 MED ORDER — SODIUM CHLORIDE 0.9 % IV SOLN: INTRAVENOUS | Status: DC

## 2020-12-13 MED ORDER — LIDOCAINE-EPINEPHRINE 0.5 %-1:200000 IJ SOLN
INTRAMUSCULAR | Status: DC | PRN
  Administered 2020-12-13: 8 mL
  Administered 2020-12-13: 2 mL
  Administered 2020-12-13: 10 mL

## 2020-12-13 MED ORDER — LIDOCAINE HCL (CARDIAC) PF 50 MG/5ML IV SOSY
PREFILLED_SYRINGE | INTRAVENOUS | Status: DC | PRN
  Administered 2020-12-13: 30 mg via INTRAVENOUS

## 2020-12-13 MED ORDER — CHLORHEXIDINE GLUCONATE 4 % EX LIQD: 60.0000 mL | Freq: Once | CUTANEOUS | Status: DC

## 2020-12-13 MED ORDER — MIDAZOLAM HCL 2 MG/2ML IJ SOLN
INTRAMUSCULAR | Status: AC
  Filled 2020-12-13: qty 2

## 2020-12-13 MED ORDER — HYDROCODONE-ACETAMINOPHEN 7.5-325 MG PO TABS: 1.0000 | ORAL_TABLET | Freq: Once | ORAL | Status: DC | PRN

## 2020-12-13 MED ORDER — ONDANSETRON HCL 4 MG/2ML IJ SOLN: 4.0000 mg | Freq: Once | INTRAMUSCULAR | Status: DC | PRN

## 2020-12-13 MED ORDER — ONDANSETRON HCL 4 MG/2ML IJ SOLN
INTRAMUSCULAR | Status: AC
  Filled 2020-12-13: qty 2

## 2020-12-13 MED ORDER — ONDANSETRON HCL 4 MG/2ML IJ SOLN
INTRAMUSCULAR | Status: DC | PRN
  Administered 2020-12-13: 4 mg via INTRAVENOUS

## 2020-12-13 MED ORDER — HYDROMORPHONE HCL 1 MG/ML IJ SOLN: 0.2500 mg | INTRAMUSCULAR | Status: DC | PRN

## 2020-12-13 MED ORDER — PROPOFOL 10 MG/ML IV BOLUS
INTRAVENOUS | Status: AC
  Filled 2020-12-13: qty 40

## 2020-12-13 MED ORDER — FENTANYL CITRATE (PF) 100 MCG/2ML IJ SOLN
INTRAMUSCULAR | Status: DC | PRN
  Administered 2020-12-13 (×2): 50 ug via INTRAVENOUS

## 2020-12-13 MED ORDER — MIDAZOLAM HCL 5 MG/5ML IJ SOLN
INTRAMUSCULAR | Status: DC | PRN
  Administered 2020-12-13: 2 mg via INTRAVENOUS

## 2020-12-13 MED ORDER — FENTANYL CITRATE (PF) 100 MCG/2ML IJ SOLN
INTRAMUSCULAR | Status: AC
  Filled 2020-12-13: qty 2

## 2020-12-13 SURGICAL SUPPLY — 40 items
ADH SKN CLS APL DERMABOND .7 (GAUZE/BANDAGES/DRESSINGS) ×1
ARMBAND PINK RESTRICT EXTREMIT (MISCELLANEOUS) ×2 IMPLANT
BAG HAMPER (MISCELLANEOUS) ×2 IMPLANT
CANNULA VESSEL 3MM 2 BLNT TIP (CANNULA) ×2 IMPLANT
CLIP LIGATING EXTRA MED SLVR (CLIP) ×2 IMPLANT
CLIP LIGATING EXTRA SM BLUE (MISCELLANEOUS) ×2 IMPLANT
COVER LIGHT HANDLE STERIS (MISCELLANEOUS) ×4 IMPLANT
COVER MAYO STAND XLG (MISCELLANEOUS) ×2 IMPLANT
DECANTER SPIKE VIAL GLASS SM (MISCELLANEOUS) ×2 IMPLANT
DERMABOND ADVANCED (GAUZE/BANDAGES/DRESSINGS) ×1
DERMABOND ADVANCED .7 DNX12 (GAUZE/BANDAGES/DRESSINGS) ×1 IMPLANT
ELECT REM PT RETURN 9FT ADLT (ELECTROSURGICAL) ×2
ELECTRODE REM PT RTRN 9FT ADLT (ELECTROSURGICAL) ×1 IMPLANT
GAUZE SPONGE 4X4 12PLY STRL (GAUZE/BANDAGES/DRESSINGS) ×2 IMPLANT
GLOVE SS BIOGEL STRL SZ 7.5 (GLOVE) ×1 IMPLANT
GLOVE SUPERSENSE BIOGEL SZ 7.5 (GLOVE) ×1
GLOVE SURG UNDER POLY LF SZ7 (GLOVE) ×6 IMPLANT
GOWN STRL REUS W/TWL LRG LVL3 (GOWN DISPOSABLE) ×6 IMPLANT
IV NS 500ML (IV SOLUTION) ×2
IV NS 500ML BAXH (IV SOLUTION) ×1 IMPLANT
KIT BLADEGUARD II DBL (SET/KITS/TRAYS/PACK) ×2 IMPLANT
KIT TURNOVER KIT A (KITS) ×2 IMPLANT
MANIFOLD NEPTUNE II (INSTRUMENTS) ×2 IMPLANT
MARKER SKIN DUAL TIP RULER LAB (MISCELLANEOUS) ×4 IMPLANT
NDL HYPO 18GX1.5 BLUNT FILL (NEEDLE) ×1 IMPLANT
NEEDLE HYPO 18GX1.5 BLUNT FILL (NEEDLE) ×2 IMPLANT
NS IRRIG 1000ML POUR BTL (IV SOLUTION) ×2 IMPLANT
PACK CV ACCESS (CUSTOM PROCEDURE TRAY) ×2 IMPLANT
PAD ARMBOARD 7.5X6 YLW CONV (MISCELLANEOUS) ×4 IMPLANT
SET BASIN LINEN APH (SET/KITS/TRAYS/PACK) ×2 IMPLANT
SOL PREP POV-IOD 4OZ 10% (MISCELLANEOUS) ×2 IMPLANT
SOL PREP PROV IODINE SCRUB 4OZ (MISCELLANEOUS) ×2 IMPLANT
SPONGE T-LAP 18X18 ~~LOC~~+RFID (SPONGE) ×1 IMPLANT
SUT PROLENE 6 0 CC (SUTURE) ×2 IMPLANT
SUT SILK 2 0 SH (SUTURE) IMPLANT
SUT VIC AB 3-0 SH 27 (SUTURE) ×2
SUT VIC AB 3-0 SH 27X BRD (SUTURE) ×1 IMPLANT
SYR 10ML LL (SYRINGE) ×2 IMPLANT
SYR BULB IRRIG 60ML STRL (SYRINGE) ×2 IMPLANT
UNDERPAD 30X36 HEAVY ABSORB (UNDERPADS AND DIAPERS) ×2 IMPLANT

## 2020-12-13 NOTE — Anesthesia Preprocedure Evaluation (Signed)
Anesthesia Evaluation  Patient identified by MRN, date of birth, ID band Patient awake    Reviewed: Allergy & Precautions, H&P , NPO status , Patient's Chart, lab work & pertinent test results, reviewed documented beta blocker date and time   Airway Mallampati: II  TM Distance: >3 FB Neck ROM: full    Dental no notable dental hx. (+) Teeth Intact   Pulmonary sleep apnea ,    Pulmonary exam normal breath sounds clear to auscultation       Cardiovascular Exercise Tolerance: Good hypertension, +CHF   Rhythm:regular Rate:Normal     Neuro/Psych  Headaches, Seizures -,  PSYCHIATRIC DISORDERS  Neuromuscular disease    GI/Hepatic Neg liver ROS, GERD  Medicated,  Endo/Other  diabetesMorbid obesity  Renal/GU ESRFRenal disease  negative genitourinary   Musculoskeletal   Abdominal   Peds  Hematology  (+) Blood dyscrasia, anemia ,   Anesthesia Other Findings Super morbid obesity  Reproductive/Obstetrics negative OB ROS                             Anesthesia Physical  Anesthesia Plan  ASA: 4  Anesthesia Plan: General   Post-op Pain Management:    Induction:   PONV Risk Score and Plan: Ondansetron and TIVA  Airway Management Planned:   Additional Equipment:   Intra-op Plan:   Post-operative Plan:   Informed Consent: I have reviewed the patients History and Physical, chart, labs and discussed the procedure including the risks, benefits and alternatives for the proposed anesthesia with the patient or authorized representative who has indicated his/her understanding and acceptance.     Dental Advisory Given  Plan Discussed with: CRNA  Anesthesia Plan Comments: (TIVA with local by surgeon)        Anesthesia Quick Evaluation

## 2020-12-13 NOTE — Anesthesia Procedure Notes (Signed)
Date/Time: 12/13/2020 10:40 AM Performed by: Vista Deck, CRNA Pre-anesthesia Checklist: Patient identified, Emergency Drugs available, Suction available, Timeout performed and Patient being monitored Patient Re-evaluated:Patient Re-evaluated prior to induction Oxygen Delivery Method: Non-rebreather mask

## 2020-12-13 NOTE — Progress Notes (Signed)
Attempted to give report to [facility] multiple times without response. Dr. Donnetta Hutching has given report to sister per pt

## 2020-12-13 NOTE — Progress Notes (Signed)
PT resides at [facility] [id number]. Requesting office call facility for follow up appt

## 2020-12-13 NOTE — H&P (Signed)
   [facility] [place]  Patient name: Sheila Austin MRN: [id number] DOB: [birth year] Sex: female    HPI: Sheila Austin is a 35 y.o. female here today for outpatient right arm access.  She had recent progression to end-stage renal disease and has had dialysis via a right IJ tunneled catheter placed while inpatient.  Vein mapping while inpatient reviewed and demonstrated moderate right basilic vein and very small cephalic vein.  Plan today is for for stage brachiobasilic fistula versus Gore-Tex graft  Current Facility-Administered Medications  Medication Dose Route Frequency Provider Last Rate Last Admin   0.9 %  sodium chloride infusion   Intravenous Continuous Leon Goodnow, Arvilla Meres, MD 20 mL/hr at 12/13/20 1008 Continued from Pre-op at 12/13/20 1008   chlorhexidine (HIBICLENS) 4 % liquid 4 application  60 mL Topical Once Tequita Marrs, Arvilla Meres, MD       And   [START ON 12/14/2020] chlorhexidine (HIBICLENS) 4 % liquid 4 application  60 mL Topical Once Hymie Gorr, Arvilla Meres, MD       vancomycin (VANCOREADY) IVPB 1500 mg/300 mL  1,500 mg Intravenous 60 min Pre-Op Dionis Autry, Arvilla Meres, MD         PHYSICAL EXAM: Vitals:   12/13/20 0915 12/13/20 0918  BP: (!) 187/97   Pulse: 82   Resp: 18   Temp:  98.4 F (36.9 C)  TempSrc:  Oral  SpO2: 97%     GENERAL: The patient is a well-nourished female, in no acute distress. The vital signs are documented above. Small surface veins.  2+ radial pulse bilaterally  MEDICAL ISSUES: Right-handed patient with new onset of end-stage renal disease being dialyzed via catheter.  I discussed options at length including fistula and graft.  Duplex revealed good caliber basilic vein on the right.  We will plan for stage basilic vein fistula.  If her vein is inadequate we will place Gore-Tex graft today   [name], MD FACS [facility] [place] Office Tel [phone]  Note: Portions of this report  may have been transcribed using voice recognition software.  Every effort has been made to ensure accuracy; however, inadvertent computerized transcription errors may still be present.

## 2020-12-13 NOTE — Anesthesia Postprocedure Evaluation (Signed)
Anesthesia Post Note  Patient: Sheila Austin  Procedure(s) Performed: RIGHT ARM ARTERIOVENOUS (AV) FISTULA  (BRACHIOBASILIC)  (Right: Arm Upper)  Patient location during evaluation: PACU Anesthesia Type: General and Regional Level of consciousness: awake and alert Pain management: pain level controlled Vital Signs Assessment: post-procedure vital signs reviewed and stable Respiratory status: spontaneous breathing, nonlabored ventilation, respiratory function stable and patient connected to nasal cannula oxygen Cardiovascular status: blood pressure returned to baseline and stable Postop Assessment: no apparent nausea or vomiting Anesthetic complications: no   No notable events documented.   Last Vitals:  Vitals:   12/13/20 1245 12/13/20 1311  BP: (!) 145/82 (!) 168/89  Pulse: 82 83  Resp: 11 12  Temp:  37 C  SpO2: 97% 97%    Last Pain:  Vitals:   12/13/20 1311  TempSrc: Oral  PainSc: [place]

## 2020-12-13 NOTE — Transfer of Care (Signed)
Immediate Anesthesia Transfer of Care Note  Patient: Sheila Austin  Procedure(s) Performed: RIGHT ARM ARTERIOVENOUS (AV) FISTULA (Right: Arm Upper)  Patient Location: PACU  Anesthesia Type:General  Level of Consciousness: awake, alert  and patient cooperative  Airway & Oxygen Therapy: Patient Spontanous Breathing  Post-op Assessment: Report given to RN and Post -op Vital signs reviewed and stable  Post vital signs: Reviewed and stable  Last Vitals:  Vitals Value Taken Time  BP 139/81 12/13/20 1237  Temp 97.9   Pulse 84 12/13/20 1239  Resp 16 12/13/20 1239  SpO2 94 % 12/13/20 1239  Vitals shown include unvalidated device data.  Last Pain:  Vitals:   12/13/20 0918  TempSrc: Oral  PainSc:       Patients Stated Pain Goal: 5 ([phone])  Complications: No notable events documented.

## 2020-12-13 NOTE — Op Note (Signed)
    OPERATIVE REPORT  DATE OF SURGERY: 12/13/2020  PATIENT: Sheila Austin, 35 y.o. female MRN: [id number]  DOB: 29-Aug-1985  PRE-OPERATIVE DIAGNOSIS: End-stage renal disease  POST-OPERATIVE DIAGNOSIS:  Same  PROCEDURE: Right first stage brachiobasilic fistula creation  SURGEON:  Curt Jews, M.D.  PHYSICIAN ASSISTANT: Vevelyn Royals, RN  The assistant was needed for exposure and to expedite the case  ANESTHESIA: Local with sedation  EBL: per anesthesia record  Total I/O In: 500 [I.V.:200; IV Piggyback:300] Out: -   BLOOD ADMINISTERED: none  DRAINS: none  SPECIMEN: none  COUNTS CORRECT:  YES  PATIENT DISPOSITION:  PACU - hemodynamically stable  PROCEDURE DETAILS: The patient was taken operating placed supine position with area of the right arm straight and draped you sterile fashion.  SonoSite ultrasound was used to visualize the basilic vein which was of good caliber.  There was a branch above the antecubital space and at the antecubital space.  The basilic vein was somewhat medial to the brachial artery and decision was made to separate incision over each of these.  Incision was made using local anesthesia over the basilic vein and it was mobilized to distal to the elbow and proximal to the elbow.  Tributary branches were ligated with 3-0 and 4-0 silk ties and divided.  The brachial artery was exposed through a separate incision over the antecubital space and was of good caliber.  A tunnel was created from the level of the basilic vein to the brachial artery.  The basilic vein was ligated distally and divided and was brought through the tunnel to the level of the brachial artery.  The artery was occluded proximally distally with fistula clamps and was opened with an 11 blade and sent longstanding with Potts scissors.  A small arteriotomy was used to reduce risk of steal.  The vein was cut to the appropriate length and was spatulated and sewn end-to-side to the artery with a  running 6-0 Prolene suture.  Prior to completion of the closure a 2-1/2 dilator passed easily proximally and distally through the anastomosis.  Anastomosis completed and clamps removed and excellent thrill was noted.  The wounds were irrigated with saline.  Hemostasis was obtained electrocautery.  The wounds were closed with 3-0 Vicryl in the subcutaneous and subcuticular tissue.  Patient did have a palpable radial pulse at the end of the procedure.  She was transferred to the recovery room in stable condition   Sheila Austin, M.D., [facility] 12/13/2020 12:36 PM  Note: Portions of this report may have been transcribed using voice recognition software.  Every effort has been made to ensure accuracy; however, inadvertent computerized transcription errors may still be present.

## 2020-12-13 NOTE — Discharge Instructions (Signed)
[facility]  Discharge Instructions  AV Fistula or Graft Surgery for Dialysis Access  Please refer to the following instructions for your post-procedure care. Your surgeon or physician assistant will discuss any changes with you.  Activity  You may drive the day following your surgery, if you are comfortable and no longer taking prescription pain medication. Resume full activity as the soreness in your incision resolves.  Bathing/Showering  You may shower after you go home. Keep your incision dry for 48 hours. Do not soak in a bathtub, hot tub, or swim until the incision heals completely. You may not shower if you have a hemodialysis catheter.  Incision Care  Clean your incision with mild soap and water after 48 hours. Pat the area dry with a clean towel. You do not need a bandage unless otherwise instructed. Do not apply any ointments or creams to your incision. You may have skin glue on your incision. Do not peel it off. It will come off on its own in about one week. Your arm may swell a bit after surgery. To reduce swelling use pillows to elevate your arm so it is above your heart. Your doctor will tell you if you need to lightly wrap your arm with an ACE bandage.  Diet  Resume your normal diet. There are not special food restrictions following this procedure. In order to heal from your surgery, it is CRITICAL to get adequate nutrition. Your body requires vitamins, minerals, and protein. Vegetables are the best source of vitamins and minerals. Vegetables also provide the perfect balance of protein. Processed food has little nutritional value, so try to avoid this.  Medications  Resume taking all of your medications. If your incision is causing pain, you may take over-the counter pain relievers such as acetaminophen (Tylenol). If you were prescribed a stronger pain medication, please be aware these medications can cause nausea and constipation. Prevent  nausea by taking the medication with a snack or meal. Avoid constipation by drinking plenty of fluids and eating foods with high amount of fiber, such as fruits, vegetables, and grains.  Do not take Tylenol if you are taking prescription pain medications.  Follow up Your surgeon may want to see you in the office following your access surgery. If so, this will be arranged at the time of your surgery.  Please call us immediately for any of the following conditions:  Increased pain, redness, drainage (pus) from your incision site Fever of 101 degrees or higher Severe or worsening pain at your incision site Hand pain or numbness.  Reduce your risk of vascular disease:  Stop smoking. If you would like help, call [facility] at 1-800-QUIT-NOW (207)502-3618) or [place] [facility] [place] your cholesterol Maintain a desired weight Control your diabetes Keep your blood pressure down  Dialysis  It will take several weeks to several months for your new dialysis access to be ready for use. Your surgeon will determine when it is okay to use it. Your nephrologist will continue to direct your dialysis. You can continue to use your Permcath until your new access is ready for use.   12/13/2020 JANEVA SHINKLE [id number] 09-Dec-1985  Surgeon(s): Yared Susan, Arvilla Meres, MD  Procedure(s): RIGHT ARM ARTERIOVENOUS (AV) FISTULA   May stick graft immediately   May stick graft on designated area only:    Do not stick fistula for 12 weeks    If you have any questions, please call the office at [phone].

## 2020-12-14 ENCOUNTER — Encounter (HOSPITAL_COMMUNITY): Payer: Self-pay | Admitting: Vascular Surgery

## 2021-01-05 DEATH — deceased

## 2021-01-25 ENCOUNTER — Encounter: Payer: Medicaid Other | Admitting: Vascular Surgery

## 2021-06-19 IMAGING — DX DG KNEE COMPLETE 4+V*R*
4 series · 4 of 4 positions shown · non-contrast
Comparison: None.

CLINICAL DATA: Known right BKA with fever and increased purulence

EXAM:
RIGHT KNEE - COMPLETE 4+ VIEW

[knee ap]
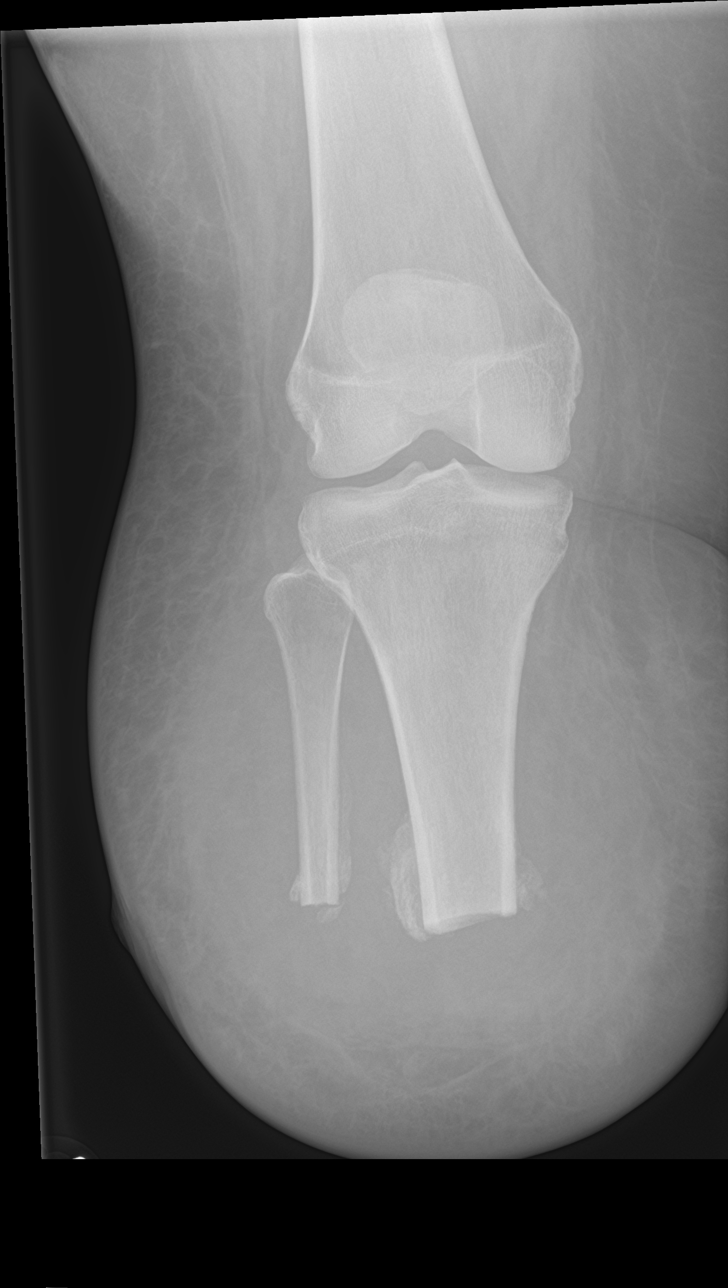

[knee lat]
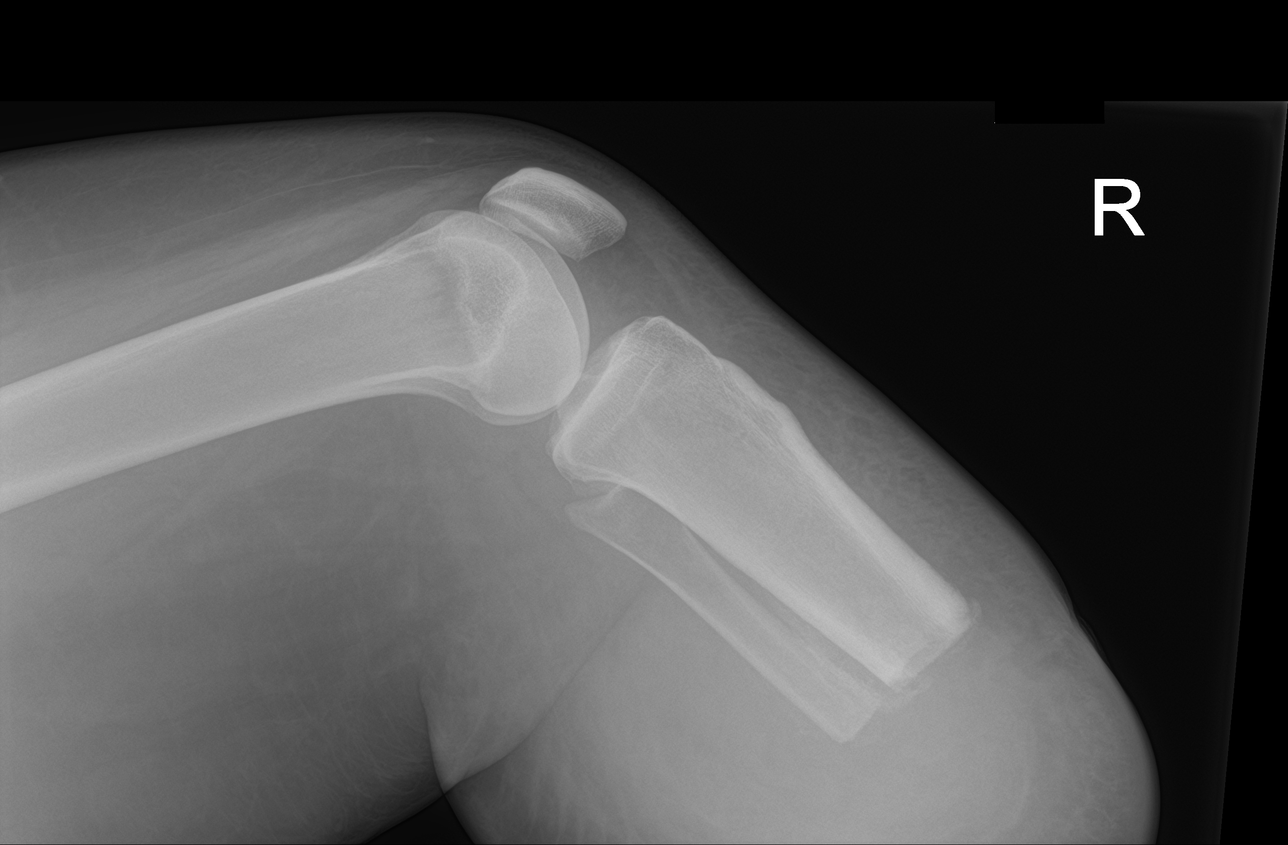

[knee obl (1 of 2)]
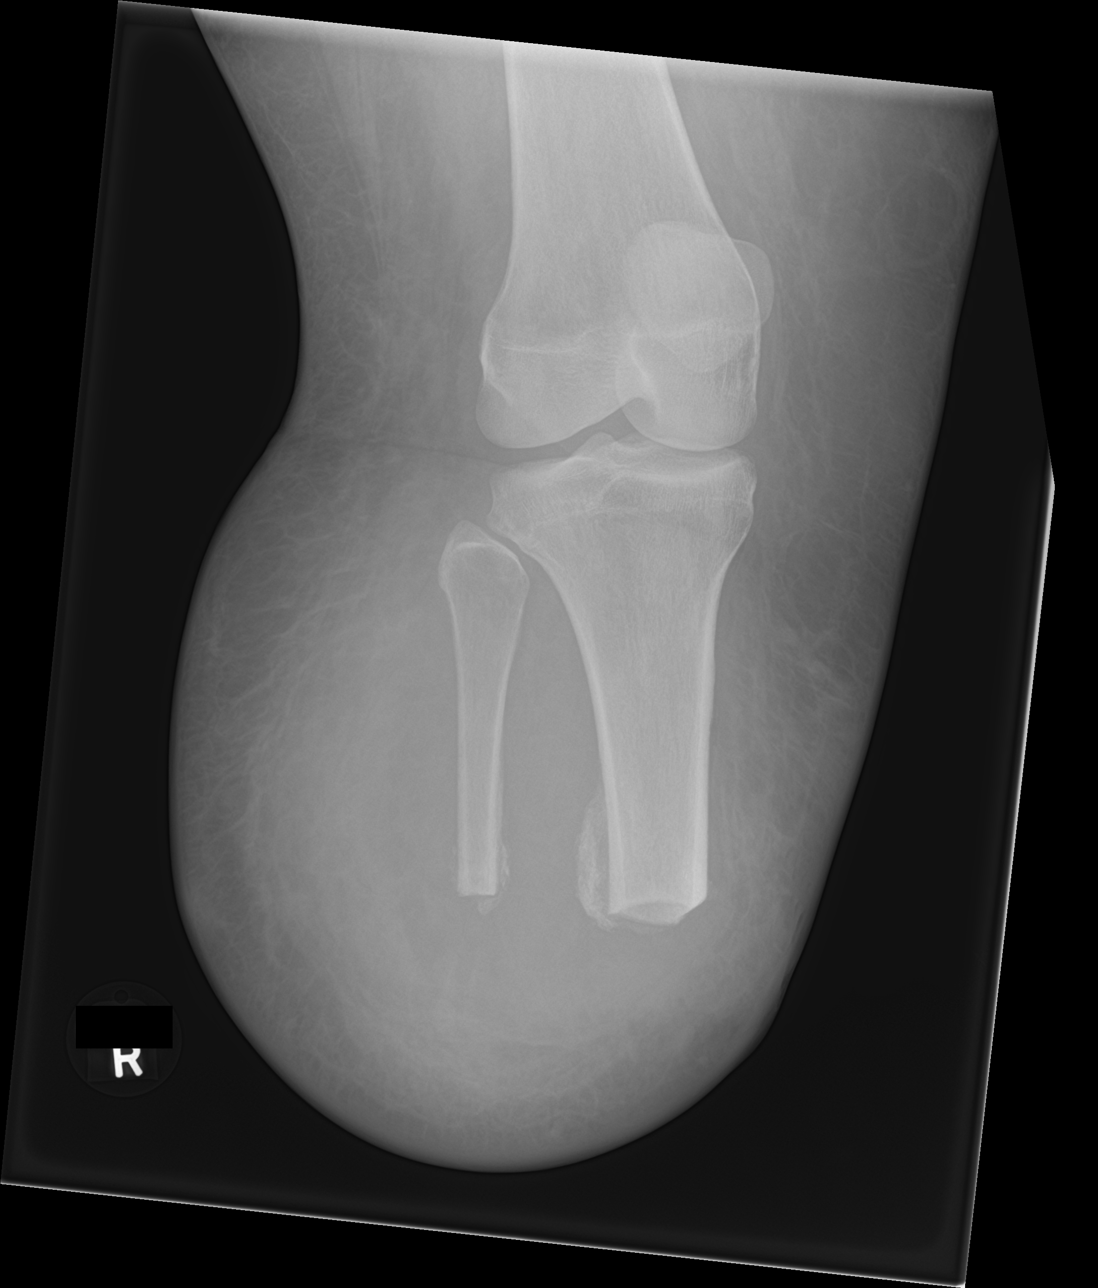

[knee obl (2 of 2)]
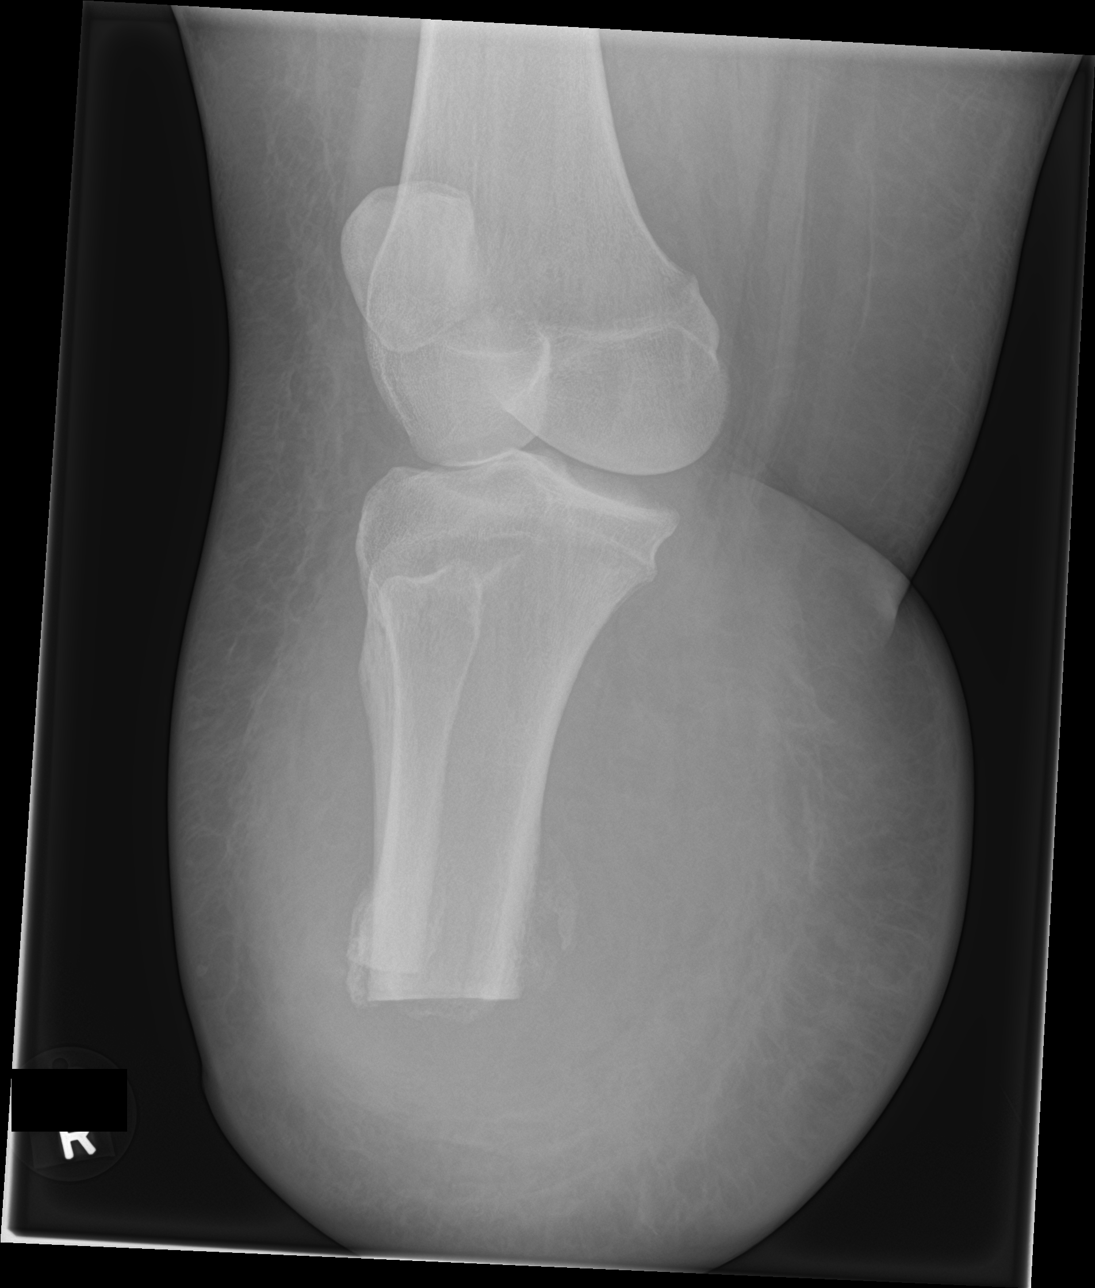

[4 of 4 positions shown; findings below may reference images not displayed]

FINDINGS: Changes of below the knee amputation are seen. Diffuse soft tissue
edema is noted. No definitive bony erosive changes are noted to
suggest osteomyelitis. The joint appears within normal limits.
IMPRESSION: Changes consistent with BKA and soft tissue swelling. No
osteomyelitis changes are seen.
